# Patient Record
Sex: Female | Born: 1970 | Race: Black or African American | Hispanic: No | Marital: Single | Smoking: Never smoker
Health system: Southern US, Community
[De-identification: ages and names within clinical notes are randomized; demographics above are authoritative.]

## PROBLEM LIST (undated history)

## (undated) DIAGNOSIS — Z923 Personal history of irradiation: Secondary | ICD-10-CM

## (undated) DIAGNOSIS — I509 Heart failure, unspecified: Secondary | ICD-10-CM

## (undated) DIAGNOSIS — R51 Headache: Secondary | ICD-10-CM

## (undated) DIAGNOSIS — C787 Secondary malignant neoplasm of liver and intrahepatic bile duct: Secondary | ICD-10-CM

## (undated) DIAGNOSIS — F419 Anxiety disorder, unspecified: Secondary | ICD-10-CM

## (undated) DIAGNOSIS — G629 Polyneuropathy, unspecified: Secondary | ICD-10-CM

## (undated) DIAGNOSIS — C7800 Secondary malignant neoplasm of unspecified lung: Secondary | ICD-10-CM

## (undated) DIAGNOSIS — D509 Iron deficiency anemia, unspecified: Secondary | ICD-10-CM

## (undated) DIAGNOSIS — C50912 Malignant neoplasm of unspecified site of left female breast: Secondary | ICD-10-CM

## (undated) DIAGNOSIS — I82409 Acute embolism and thrombosis of unspecified deep veins of unspecified lower extremity: Secondary | ICD-10-CM

## (undated) HISTORY — PX: BREAST SURGERY: SHX581

---

## 1996-08-13 HISTORY — PX: TUBAL LIGATION: SHX77

## 2011-08-14 ENCOUNTER — Emergency Department (HOSPITAL_COMMUNITY)
Admission: EM | Admit: 2011-08-14 | Discharge: 2011-08-14 | Disposition: A | Discharge status: Home / Self Care | Payer: No Typology Code available for payment source | Attending: Emergency Medicine | Admitting: Emergency Medicine

## 2011-08-14 ENCOUNTER — Emergency Department (HOSPITAL_COMMUNITY): Payer: No Typology Code available for payment source

## 2011-08-14 DIAGNOSIS — M542 Cervicalgia: Secondary | ICD-10-CM | POA: Insufficient documentation

## 2011-08-14 DIAGNOSIS — M545 Low back pain, unspecified: Secondary | ICD-10-CM | POA: Insufficient documentation

## 2011-08-14 DIAGNOSIS — T148XXA Other injury of unspecified body region, initial encounter: Secondary | ICD-10-CM | POA: Insufficient documentation

## 2013-04-23 ENCOUNTER — Other Ambulatory Visit (INDEPENDENT_AMBULATORY_CARE_PROVIDER_SITE_OTHER): Payer: Self-pay | Admitting: General Surgery

## 2013-04-23 ENCOUNTER — Ambulatory Visit (INDEPENDENT_AMBULATORY_CARE_PROVIDER_SITE_OTHER): Payer: PRIVATE HEALTH INSURANCE | Admitting: General Surgery

## 2013-04-23 ENCOUNTER — Encounter (INDEPENDENT_AMBULATORY_CARE_PROVIDER_SITE_OTHER): Payer: Self-pay | Admitting: General Surgery

## 2013-04-23 VITALS — BP 136/90 | HR 84 | Temp 97.0°F | Resp 16 | Ht 65.5 in | Wt 230.8 lb

## 2013-04-23 DIAGNOSIS — N63 Unspecified lump in unspecified breast: Secondary | ICD-10-CM

## 2013-04-23 DIAGNOSIS — N6320 Unspecified lump in the left breast, unspecified quadrant: Secondary | ICD-10-CM

## 2013-04-23 NOTE — Addendum Note (Signed)
Addended by: June Leap on: 04/23/2013 05:35 PM   Modules accepted: Orders

## 2013-04-23 NOTE — Progress Notes (Signed)
Subjective:     Patient ID: Erika [name], female   DOB: Feb 17, 1971, 42 y.o.   MRN: [id number]  HPI This patient is a 42 year old female who was referred by Dr. Margretta Ditty for evaluation of a left breast mass. The patient is a 42 year old female with a six-month history of a left breast mass. Over the last 2 months patient had an area of depression at approximately the 10:00 position. The patient has not had any previous mammograms or ultrasounds. The patient states she has no family history of breast cancer. The patient is is no breast discharge or pain in the area.  Review of Systems  Constitutional: Negative.   HENT: Negative.   Respiratory: Negative.   Cardiovascular: Negative.   Gastrointestinal: Negative.   Endocrine: Negative.   Neurological: Negative.        Objective:   Physical Exam  Constitutional: She is oriented to person, place, and time. She appears well-developed and well-nourished.  HENT:  Head: Normocephalic and atraumatic.  Eyes: Conjunctivae and EOM are normal. Pupils are equal, round, and reactive to light.  Neck: Normal range of motion. Neck supple.  Cardiovascular: Normal rate, regular rhythm and normal heart sounds.   Pulmonary/Chest: Effort normal and breath sounds normal.    Abdominal: Soft. Bowel sounds are normal.  Musculoskeletal: Normal range of motion.  Neurological: She is alert and oriented to person, place, and time.       Assessment:     A 42 year old female with a left breast mass.     Plan:     1. I couldn't aspirate any purulence at the most superficial area of the mass. This was negative for purulence. 2. Proceed with a punch biopsy of the skin of the  superficial mass.  3. We'll have the patient undergo a screening mammogram in ultrasound of her left breast as soon as possible. 4. Once pathology results of her radiological examinations return will call the patient and discuss the results.

## 2013-04-24 ENCOUNTER — Other Ambulatory Visit (INDEPENDENT_AMBULATORY_CARE_PROVIDER_SITE_OTHER): Payer: Self-pay

## 2013-04-24 ENCOUNTER — Ambulatory Visit
Admission: RE | Admit: 2013-04-24 | Discharge: 2013-04-24 | Disposition: A | Discharge status: Home / Self Care | Payer: No Typology Code available for payment source | Source: Ambulatory Visit | Attending: General Surgery | Admitting: General Surgery

## 2013-04-24 ENCOUNTER — Ambulatory Visit (INDEPENDENT_AMBULATORY_CARE_PROVIDER_SITE_OTHER): Payer: PRIVATE HEALTH INSURANCE | Admitting: General Surgery

## 2013-04-24 ENCOUNTER — Encounter (INDEPENDENT_AMBULATORY_CARE_PROVIDER_SITE_OTHER): Payer: Self-pay

## 2013-04-24 ENCOUNTER — Telehealth (INDEPENDENT_AMBULATORY_CARE_PROVIDER_SITE_OTHER): Payer: Self-pay | Admitting: General Surgery

## 2013-04-24 VITALS — BP 132/88 | HR 82 | Temp 97.0°F | Resp 18

## 2013-04-24 DIAGNOSIS — Z4800 Encounter for change or removal of nonsurgical wound dressing: Secondary | ICD-10-CM

## 2013-04-24 DIAGNOSIS — N6320 Unspecified lump in the left breast, unspecified quadrant: Secondary | ICD-10-CM

## 2013-04-24 NOTE — Progress Notes (Signed)
Patient came in today after leaving the [facility] for a dressing change..pt's wound looked the same as when we seen her yesterday in urgent office...there has been no change...patient has appt tomorrow 5/14 at 3:00 to see me...she knows to call the office should any questions or concerns come up before then

## 2013-04-24 NOTE — Telephone Encounter (Signed)
Left a message x 2 for patient she has a stat appt  Today 04/24/13 at 12:45

## 2013-04-25 ENCOUNTER — Encounter (INDEPENDENT_AMBULATORY_CARE_PROVIDER_SITE_OTHER): Payer: Self-pay | Admitting: General Surgery

## 2013-04-25 ENCOUNTER — Ambulatory Visit (INDEPENDENT_AMBULATORY_CARE_PROVIDER_SITE_OTHER): Payer: PRIVATE HEALTH INSURANCE | Admitting: General Surgery

## 2013-04-25 VITALS — BP 122/70 | HR 97 | Temp 97.0°F | Ht 65.5 in | Wt 233.4 lb

## 2013-04-25 DIAGNOSIS — Z4800 Encounter for change or removal of nonsurgical wound dressing: Secondary | ICD-10-CM

## 2013-04-25 NOTE — Progress Notes (Signed)
Patient came in today for her daily dressing change...wound is still draining...she has another appt for dressing change 5/15 3:00 and she is aware

## 2013-04-26 ENCOUNTER — Telehealth (INDEPENDENT_AMBULATORY_CARE_PROVIDER_SITE_OTHER): Payer: Self-pay | Admitting: General Surgery

## 2013-04-26 ENCOUNTER — Ambulatory Visit (INDEPENDENT_AMBULATORY_CARE_PROVIDER_SITE_OTHER): Payer: PRIVATE HEALTH INSURANCE | Admitting: General Surgery

## 2013-04-26 DIAGNOSIS — N63 Unspecified lump in unspecified breast: Secondary | ICD-10-CM

## 2013-04-26 DIAGNOSIS — N6320 Unspecified lump in the left breast, unspecified quadrant: Secondary | ICD-10-CM

## 2013-04-26 NOTE — Telephone Encounter (Signed)
I called the patient and discuss her biopsy pathology. The patient is set to undergo a mammogram and ultrasound in one week. We'll have patient follow back up after her radiological studies to review them and proceed with the plan of care. I answered all her questions and concerns.

## 2013-04-26 NOTE — Progress Notes (Signed)
Patient states she is feeling better today . She has a friend with her for support . Left Breast area dressing was taken off and  A sterile dressing with 4x4 gauze and ABD pads x2 were applied with tape . Patient has return appt for tomorrow 04/27/13 nurse only

## 2013-04-27 ENCOUNTER — Encounter (INDEPENDENT_AMBULATORY_CARE_PROVIDER_SITE_OTHER): Payer: PRIVATE HEALTH INSURANCE

## 2013-04-27 ENCOUNTER — Encounter (INDEPENDENT_AMBULATORY_CARE_PROVIDER_SITE_OTHER): Payer: Self-pay | Admitting: General Surgery

## 2013-04-30 ENCOUNTER — Ambulatory Visit (INDEPENDENT_AMBULATORY_CARE_PROVIDER_SITE_OTHER): Payer: PRIVATE HEALTH INSURANCE | Admitting: General Surgery

## 2013-04-30 ENCOUNTER — Encounter (INDEPENDENT_AMBULATORY_CARE_PROVIDER_SITE_OTHER): Payer: Self-pay | Admitting: General Surgery

## 2013-04-30 VITALS — BP 116/82 | HR 96 | Temp 96.0°F | Ht 65.5 in | Wt 234.6 lb

## 2013-04-30 DIAGNOSIS — Z4800 Encounter for change or removal of nonsurgical wound dressing: Secondary | ICD-10-CM

## 2013-04-30 NOTE — Progress Notes (Signed)
Patient comes in today for her daily dressing change..pts wound is continuing to heal and patient has an appt to come in and see AR tomorrow to possibly silver nitrate one of the incisions that is bleeding..patient is doing fine and had no problems over the weekend.Marland KitchenMarland KitchenAR is aware of bleeding and is aware of pt's appt tomorrow on 05/01/13 and possible silver nitrate

## 2013-05-01 ENCOUNTER — Encounter (INDEPENDENT_AMBULATORY_CARE_PROVIDER_SITE_OTHER): Payer: PRIVATE HEALTH INSURANCE

## 2013-05-01 ENCOUNTER — Ambulatory Visit (INDEPENDENT_AMBULATORY_CARE_PROVIDER_SITE_OTHER): Payer: PRIVATE HEALTH INSURANCE | Admitting: General Surgery

## 2013-05-01 ENCOUNTER — Encounter (INDEPENDENT_AMBULATORY_CARE_PROVIDER_SITE_OTHER): Payer: Self-pay | Admitting: General Surgery

## 2013-05-01 VITALS — BP 136/72 | HR 106 | Temp 97.8°F | Resp 18 | Ht 64.0 in | Wt 233.0 lb

## 2013-05-01 DIAGNOSIS — N6320 Unspecified lump in the left breast, unspecified quadrant: Secondary | ICD-10-CM

## 2013-05-01 DIAGNOSIS — N63 Unspecified lump in unspecified breast: Secondary | ICD-10-CM

## 2013-05-01 NOTE — Progress Notes (Signed)
Patient ID: Erika [name], female   DOB: 12-11-1971, 42 y.o.   MRN: [id number] The patient is a 42 year old female with a left breast mass, and breast carcinoma. The patient been coming to nurse only clinics for her change in her dressing. Wound looks great today with minimal drainagec, no bleeding.  The patient continue with nurse only visits for dressing changes. The patient states she feels ready to change the dressing on her own.  The patient is to return to the breast center for her mammogram and ultrasound in one week. Will follow up with those studies and have her follow up with [place] to discuss the results.

## 2013-05-02 ENCOUNTER — Encounter (INDEPENDENT_AMBULATORY_CARE_PROVIDER_SITE_OTHER): Payer: Self-pay | Admitting: General Surgery

## 2013-05-02 ENCOUNTER — Ambulatory Visit (INDEPENDENT_AMBULATORY_CARE_PROVIDER_SITE_OTHER): Payer: PRIVATE HEALTH INSURANCE | Admitting: General Surgery

## 2013-05-02 VITALS — BP 102/78 | HR 97 | Temp 96.8°F | Ht 65.5 in | Wt 232.8 lb

## 2013-05-02 DIAGNOSIS — Z4800 Encounter for change or removal of nonsurgical wound dressing: Secondary | ICD-10-CM

## 2013-05-02 NOTE — Progress Notes (Signed)
Patient comes in today for her daily dressing change..wound still healing and looked the same..pt tolerated and will start doing dressing changes daily to herself starting tomorrow 05/03/13...she is set up for mgm and u/s on 5/27 and knows to call if she has any questions or concerns that arise at any given time...patient would like a refill on her pain med..she is aware that you are off after call and that I would be talking to you about this in the office tomorrow

## 2013-05-03 ENCOUNTER — Telehealth (INDEPENDENT_AMBULATORY_CARE_PROVIDER_SITE_OTHER): Payer: Self-pay | Admitting: General Surgery

## 2013-05-03 NOTE — Telephone Encounter (Signed)
Called refill on hydrocodone 5/325 #30 no refills to [facility] [phone] and spoke with [name]..called patient to make her aware that the refill had been called in as well.Erika [name]

## 2013-05-08 ENCOUNTER — Ambulatory Visit: Admission: RE | Admit: 2013-05-08 | Payer: No Typology Code available for payment source | Source: Ambulatory Visit

## 2013-05-08 ENCOUNTER — Ambulatory Visit
Admission: RE | Admit: 2013-05-08 | Discharge: 2013-05-08 | Disposition: A | Discharge status: Home / Self Care | Payer: No Typology Code available for payment source | Source: Ambulatory Visit | Attending: General Surgery | Admitting: General Surgery

## 2013-05-08 ENCOUNTER — Other Ambulatory Visit (INDEPENDENT_AMBULATORY_CARE_PROVIDER_SITE_OTHER): Payer: Self-pay | Admitting: General Surgery

## 2013-05-08 ENCOUNTER — Telehealth (INDEPENDENT_AMBULATORY_CARE_PROVIDER_SITE_OTHER): Payer: Self-pay | Admitting: General Surgery

## 2013-05-08 DIAGNOSIS — N6320 Unspecified lump in the left breast, unspecified quadrant: Secondary | ICD-10-CM

## 2013-05-08 DIAGNOSIS — C50912 Malignant neoplasm of unspecified site of left female breast: Secondary | ICD-10-CM

## 2013-05-08 NOTE — Telephone Encounter (Signed)
Called cancer ctr to get patient presented for cancer confrence and spoke with Malachi Bonds at 3:25 05/08/13...she stated she would pass the info to [place]...referral put in system and patient notified that she should hear something from [facility] [phone] in the next 48hrs and if she does not hear anything to call me back and I will call and check the status...patient was ok with all instructions...referral to carla for pre-cert

## 2013-05-10 ENCOUNTER — Encounter: Payer: Self-pay | Admitting: Oncology

## 2013-05-10 ENCOUNTER — Encounter: Payer: Self-pay | Admitting: *Deleted

## 2013-05-11 ENCOUNTER — Telehealth (INDEPENDENT_AMBULATORY_CARE_PROVIDER_SITE_OTHER): Payer: Self-pay

## 2013-05-11 NOTE — Telephone Encounter (Signed)
Called pt to make her an appt with Dr Dwain Sarna for next week on 6/3 to discuss her new diagnosis of breast cancer.The pt is being referred by Dr Derrell Lolling to Dr Dwain Sarna. The pt understands.

## 2013-05-14 ENCOUNTER — Encounter: Payer: Self-pay | Admitting: *Deleted

## 2013-05-15 ENCOUNTER — Ambulatory Visit (INDEPENDENT_AMBULATORY_CARE_PROVIDER_SITE_OTHER): Payer: PRIVATE HEALTH INSURANCE | Admitting: General Surgery

## 2013-05-15 ENCOUNTER — Ambulatory Visit
Admission: RE | Admit: 2013-05-15 | Discharge: 2013-05-15 | Disposition: A | Discharge status: Home / Self Care | Payer: No Typology Code available for payment source | Source: Ambulatory Visit | Attending: Radiation Oncology | Admitting: Radiation Oncology

## 2013-05-15 ENCOUNTER — Encounter (INDEPENDENT_AMBULATORY_CARE_PROVIDER_SITE_OTHER): Payer: Self-pay | Admitting: General Surgery

## 2013-05-15 ENCOUNTER — Encounter: Payer: Self-pay | Admitting: Radiation Oncology

## 2013-05-15 VITALS — BP 118/68 | HR 70 | Temp 98.6°F | Resp 12 | Ht 65.5 in | Wt 229.6 lb

## 2013-05-15 VITALS — BP 115/82 | HR 93 | Temp 98.4°F | Resp 18 | Ht 65.0 in | Wt 231.8 lb

## 2013-05-15 DIAGNOSIS — C50212 Malignant neoplasm of upper-inner quadrant of left female breast: Secondary | ICD-10-CM

## 2013-05-15 DIAGNOSIS — C50219 Malignant neoplasm of upper-inner quadrant of unspecified female breast: Secondary | ICD-10-CM | POA: Insufficient documentation

## 2013-05-15 DIAGNOSIS — D0592 Unspecified type of carcinoma in situ of left breast: Secondary | ICD-10-CM

## 2013-05-15 DIAGNOSIS — C50912 Malignant neoplasm of unspecified site of left female breast: Secondary | ICD-10-CM

## 2013-05-15 NOTE — Progress Notes (Addendum)
Radiation Oncology         [phone] ________________________________  Initial outpatient Consultation  Name: Erika [name] MRN: [id number]  Date: 05/15/2013  DOB: March 20, 1971  NW:GNFAOZHY,QMVHQI M, MD  Axel Filler, MD   REFERRING PHYSICIAN: Axel Filler, MD  DIAGNOSIS: Upper inner quadrant left breast cancer, [id number]  HISTORY OF PRESENT ILLNESS::Erika [name] is a 42 y.o. female who noticed a small red mass. on the surface of her left breast about 6 months ago. This began to grow and she saw a Dr. in urgent care and he cut into the mass assuming it was an abscess. Patient was subsequently referred to Dr. Derrell Lolling. She is s/p a biopsy on May 12 with results as below. She underwent mammography on 05/08/2013 revealing a large mass in the upper inner left breast measuring at least 10 cm. There is mild overlying skin thickening compatible with dermal invasion. There is a separate cluster of indeterminate left breast calcifications measuring 3 x 10 mm. There was an indeterminate 7 mm axillary lymph node. She is status post ultrasound on 05/08/2013 revealing a large heterogeneous mass within the upper inner left breast at the 10-11 o'clock position,  measuring at least 6.5 cm, but evaluation is limited by transducer skin area and protuberant portion of the mass. A 7 mm level I left axillary lymph node is identified with slightly echogenic rim -suspicious.  She is seeing Dr. Dwain Sarna this afternoon and sees Dr. Welton Flakes later this week. She denies weight loss headaches dizziness nausea vomiting or night sweats. She reports that she has had a lump that has come and gone in her left axilla - currently gone. She is fatigued. 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   Biopsy was performed on 04/23/2013. Pathology revealed : Breast, biopsy, left mass - INVASIVE HIGH GRADE CARCINOMA, SEE COMMENT. Microscopic Comment The carcinoma demonstrates the follow  immunophenotype: GCDFP - negative expression Estrogen Receptor - focal weak expression ADDITIONAL INFORMATION: CHROMOGENIC IN-SITU HYBRIDIZATION Results: HER-2/NEU BY CISH - NO AMPLIFICATION OF HER-2 DETECTED.  PREVIOUS RADIATION THERAPY: No  Gynecologic history: Started menstruating at age 55, first pregnancy age 72. She is given birth to 3 children. She is status post tubal ligation. She did not breast-feed her children.  PAST MEDICAL HISTORY:  has a past medical history of Breast cancer.  she denies any prior cancers.  PAST SURGICAL HISTORY: Past Surgical History  Procedure Laterality Date  . Breast biopsy    . Tubal ligation      FAMILY HISTORY: family history includes Cancer in her maternal aunt; Diabetes in her father; and Hypertension in her mother. she denies any history of ovarian or breast cancer in her family.  SOCIAL HISTORY:  reports that she has never smoked. She has never used smokeless tobacco. She reports that she does not drink alcohol or use illicit drugs. she lives in [place]  ALLERGIES: Review of patient's allergies indicates no known allergies.  MEDICATIONS:  Current Outpatient Prescriptions  Medication Sig Dispense Refill  . ibuprofen (ADVIL,MOTRIN) 200 MG tablet Take 200 mg by mouth every 6 (six) hours as needed for pain (600-800 mg prn left breast pain).      Marland Kitchen HYDROcodone-acetaminophen (NORCO/VICODIN) 5-325 MG per tablet Take 1 tablet by mouth every 6 (six) hours as needed for pain.       No current facility-administered medications for this encounter.    REVIEW OF SYSTEMS:     Pertinent items are noted in HPI.   PHYSICAL  EXAM:  height is 5\' 5"  (1.651 m) and weight is 231 lb 12.8 oz (105.144 kg). Her oral temperature is 98.4 F (36.9 C). Her blood pressure is 115/82 and her pulse is 93. Her respiration is 18 and oxygen saturation is 100%.    General: Alert and oriented, in no acute distress HEENT: Head is normocephalic. Pupils are equally round  and reactive to light. Extraocular movements are intact. Oropharynx is clear.  Neck: Neck is supple, no palpable cervical or supraclavicular lymphadenopathy. Heart: Regular in rate and rhythm with no murmurs, rubs, or gallops. Chest: Clear to auscultation bilaterally, with no rhonchi, wheezes, or rales. Abdomen: Soft, nontender, nondistended, with no rigidity or guarding. Extremities: No cyanosis or edema. Lymphatics: No concerning lymphadenopathy appreciated. Skin: No concerning lesions. Musculoskeletal: symmetric strength and muscle tone throughout. Neurologic: Cranial nerves II through XII are grossly intact. No obvious focalities. Speech is fluent. Coordination is intact. Psychiatric: Judgment and insight are intact. Affect is appropriate. Breasts: Right breast is unremarkable. No axillary adenopathy appreciated on either side. Left breast is notable for a large raised rounded purplish mass in the upper inner quadrant. This is the size of a small tangerine, exuding serous sanguinous fluid. No peau d'orange appearance to the breast.   LABORATORY DATA:  No results found for this basename: WBC, HGB, HCT, MCV, PLT   CMP  No results found for this basename: na, k, cl, co2, glucose, bun, creatinine, calcium, prot, albumin, ast, alt, alkphos, bilitot, gfrnonaa, gfraa      RADIOGRAPHY: US Breast Left  05/08/2013   *RADIOLOGY REPORT*  Clinical Data:  42 year old female with large left breast mass - recent punch biopsy demonstrating high-grade carcinoma.  DIGITAL DIAGNOSTIC BILATERAL MAMMOGRAM WITH CAD AND LEFT BREAST ULTRASOUND:  Comparison:  None.  Findings:  ACR Breast Density Category 2: There is a scattered fibroglandular pattern.  A large irregular mass within the upper inner posterior left breast is identified - measuring at least 10 cm. Mild overlying skin thickening is identified compatible with dermal invasion. A 3 x 10 mm cluster of slightly heterogeneous left breast calcifications  approximate 4 cm anterior and lateral to this mass is identified.  There is no evidence of suspicious mass, distortion or worrisome calcifications within the right breast. Mammographic images were processed with CAD.  On physical exam, a very large protuberant mass within the upper inner left breast is identified.  Ultrasound is performed, showing a large heterogeneous mass within the upper inner left breast at the 10-11 o'clock position, measuring at least 6.5 cm, but evaluation is limited by transducer skin area and protuberant portion of the mass. A 7 mm level I left axillary lymph node is identified with slightly echogenic rim - suspicious.  IMPRESSION: Large biopsy-proven carcinoma within the upper inner left breast, measuring at least 10 cm mammographically. Mild overlying skin thickening compatible with dermal invasion.  3 x 10 mm cluster of indeterminate left breast calcifications 4 cm anterior and lateral to the biopsy-proven neoplasm.  This may represent DCIS and consider tissue sampling.  Indeterminate 7 mm level I left axillary lymph node - metastatic disease not excluded.  No mammographic evidence of right breast malignancy.  BI-RADS CATEGORY 6:  Known biopsy-proven malignancy - appropriate action should be taken.  RECOMMENDATION: Treatment plan.  Consider biopsies of the indeterminate left axillary lymph node and left breast calcifications as clinically indicated.  Consider bilateral breast MRI as clinically indicated.  I have discussed the findings and recommendations with the patient. Results were also  provided in writing at the conclusion of the visit.   Original Report Authenticated By: Harmon Pier, M.D.   Mm Digital Diagnostic Bilat  05/08/2013   *RADIOLOGY REPORT*  Clinical Data:  42 year old female with large left breast mass - recent punch biopsy demonstrating high-grade carcinoma.  DIGITAL DIAGNOSTIC BILATERAL MAMMOGRAM WITH CAD AND LEFT BREAST ULTRASOUND:  Comparison:  None.  Findings:  ACR  Breast Density Category 2: There is a scattered fibroglandular pattern.  A large irregular mass within the upper inner posterior left breast is identified - measuring at least 10 cm. Mild overlying skin thickening is identified compatible with dermal invasion. A 3 x 10 mm cluster of slightly heterogeneous left breast calcifications approximate 4 cm anterior and lateral to this mass is identified.  There is no evidence of suspicious mass, distortion or worrisome calcifications within the right breast. Mammographic images were processed with CAD.  On physical exam, a very large protuberant mass within the upper inner left breast is identified.  Ultrasound is performed, showing a large heterogeneous mass within the upper inner left breast at the 10-11 o'clock position, measuring at least 6.5 cm, but evaluation is limited by transducer skin area and protuberant portion of the mass. A 7 mm level I left axillary lymph node is identified with slightly echogenic rim - suspicious.  IMPRESSION: Large biopsy-proven carcinoma within the upper inner left breast, measuring at least 10 cm mammographically. Mild overlying skin thickening compatible with dermal invasion.  3 x 10 mm cluster of indeterminate left breast calcifications 4 cm anterior and lateral to the biopsy-proven neoplasm.  This may represent DCIS and consider tissue sampling.  Indeterminate 7 mm level I left axillary lymph node - metastatic disease not excluded.  No mammographic evidence of right breast malignancy.  BI-RADS CATEGORY 6:  Known biopsy-proven malignancy - appropriate action should be taken.  RECOMMENDATION: Treatment plan.  Consider biopsies of the indeterminate left axillary lymph node and left breast calcifications as clinically indicated.  Consider bilateral breast MRI as clinically indicated.  I have discussed the findings and recommendations with the patient. Results were also provided in writing at the conclusion of the visit.   Original Report  Authenticated By: Harmon Pier, M.D.      IMPRESSION/PLAN: This is a lovely 42 year old woman with at least locally advanced left breast cancer. She will likely require systemic imaging and I will defer to medical oncology to order that.  She understands that chemotherapy surgery and radiotherapy will be important modalities to her care. I told her that surgery may require a mastectomy. She'll discuss this further with Dr. Dwain Sarna today. I told her that chemotherapy may precede or follow surgery, depending on the agreement between her surgeon and medical oncologist. She understands that radiotherapy is of benefit for local regional control in the region of the breast or chest wall and regional lymph nodes. She understands it will probably be a matter of months until she is ready for adjuvant radiotherapy. She understands this will take place for about 6-7 weeks. We spoke about the basic side effects of radiotherapy and logistics of receiving it. She seems comfortable with the idea of radiotherapy and all questions were answered. She understands that if I am on my maternity leave by the  time she is ready for radiotherapy, she'll be in the hands of one of my excellent partners.   I spent 45 minutes minutes face to face with the patient and more than 50% of that time was spent in counseling and/or  coordination of care.    __________________________________________   Lonie Peak, MD

## 2013-05-15 NOTE — Progress Notes (Signed)
Patient ID: Erika [name], female   DOB: Aug 28, 1971, 42 y.o.   MRN: [id number]  Chief Complaint  Patient presents with  . Breast Cancer    HPI Erika [name] is a 42 y.o. female.   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  2 months ago this began getting larger much quicker.  She eventually was seen in an urgent care where this mass was thought to be an abscess.  Apparently there was an attempt to excise or drain this that was not successful and she was referred to our office for evaluation.  She was seen in our urgent office and underwent punch biopsy of her skin and the mass.  She was then also sent for mm/us.  The biopsy shows invasive high grade carcinoma that appears to be breast.  It is her2 not amplified, er pos at 15, pr neg and Ki approaches 100%.  She has also undergone mm with at least 10 cm upper inner left breast tumor with mild overyling skin thickening.  There is also a 3x10 mm cluster of calcs near there also.  There is indeterminate 7 mm level 1 left axillary node.  No mm evidence of right breast malignancy.  She comes in today after seeing Dr. Basilio Cairo of radiation therapy for evaluation.  Past Medical History  Diagnosis Date  . Breast cancer     Past Surgical History  Procedure Laterality Date  . Tubal ligation      Family History  Problem Relation Age of Onset  . Hypertension Mother   . Diabetes Father   . Cancer Maternal Aunt     stomach    Social History History  Substance Use Topics  . Smoking status: Never Smoker   . Smokeless tobacco: Never Used  . Alcohol Use: No    No Known Allergies  Current Outpatient Prescriptions  Medication Sig Dispense Refill  . ibuprofen (ADVIL,MOTRIN) 200 MG tablet Take 200 mg by mouth every 6 (six) hours as needed for pain (600-800 mg prn left breast pain).       No current facility-administered medications for this visit.    Review of  Systems Review of Systems  Constitutional: Negative for fever, chills and unexpected weight change.  HENT: Negative for hearing loss, congestion, sore throat, trouble swallowing and voice change.   Eyes: Negative for visual disturbance.  Respiratory: Negative for cough and wheezing.   Cardiovascular: Negative for chest pain, palpitations and leg swelling.  Gastrointestinal: Negative for nausea, vomiting, abdominal pain, diarrhea, constipation, blood in stool, abdominal distention and anal bleeding.  Genitourinary: Negative for hematuria, vaginal bleeding and difficulty urinating.  Musculoskeletal: Negative for arthralgias.  Skin: Negative for rash and wound.  Neurological: Negative for seizures, syncope and headaches.  Hematological: Negative for adenopathy. Does not bruise/bleed easily.  Psychiatric/Behavioral: Negative for confusion.    Blood pressure 118/68, pulse 70, temperature 98.6 F (37 C), temperature source Temporal, resp. rate 12, height 5' 5.5" (1.664 m), weight 229 lb 9.6 oz (104.146 kg).  Physical Exam Physical Exam  Vitals reviewed. Constitutional: She appears well-developed and well-nourished.  Cardiovascular: Normal rate, regular rhythm and normal heart sounds.   Pulmonary/Chest: Effort normal and breath sounds normal. She has no wheezes. She has no rales. Right breast exhibits no inverted nipple, no mass, no nipple discharge, no skin change and no tenderness. Left breast exhibits mass, skin change and tenderness. Left breast exhibits no inverted nipple. Breasts  are symmetrical.    Abdominal: Soft.  Lymphadenopathy:    She has no cervical adenopathy.    She has axillary adenopathy.       Right axillary: No pectoral and no lateral adenopathy present.       Left axillary: Lateral adenopathy present. No pectoral adenopathy present.      Right: No supraclavicular adenopathy present.       Left: No supraclavicular adenopathy present.    Data Reviewed DIGITAL  DIAGNOSTIC BILATERAL MAMMOGRAM WITH CAD AND LEFT BREAST  ULTRASOUND:  Comparison: None.  Findings:  ACR Breast Density Category 2: There is a scattered fibroglandular  pattern.  A large irregular mass within the upper inner posterior left breast  is identified - measuring at least 10 cm. Mild overlying skin  thickening is identified compatible with dermal invasion.  A 3 x 10 mm cluster of slightly heterogeneous left breast  calcifications approximate 4 cm anterior and lateral to this mass  is identified.  There is no evidence of suspicious mass, distortion or worrisome  calcifications within the right breast.  Mammographic images were processed with CAD.  On physical exam, a very large protuberant mass within the upper  inner left breast is identified.  Ultrasound is performed, showing a large heterogeneous mass within  the upper inner left breast at the 10-11 o'clock position,  measuring at least 6.5 cm, but evaluation is limited by transducer  skin area and protuberant portion of the mass.  A 7 mm level I left axillary lymph node is identified with slightly  echogenic rim - suspicious.  IMPRESSION:  Large biopsy-proven carcinoma within the upper inner left breast,  measuring at least 10 cm mammographically. Mild overlying skin  thickening compatible with dermal invasion.  3 x 10 mm cluster of indeterminate left breast calcifications 4 cm  anterior and lateral to the biopsy-proven neoplasm. This may  represent DCIS and consider tissue sampling.  Indeterminate 7 mm level I left axillary lymph node - metastatic  disease not excluded.  No mammographic evidence of right breast malignancy.   Assessment    Locally advanced left breast cancer     Plan    Staging studies in conjunction with med onc (she sees Dr Welton Flakes on Thursday), I will plan port placement as I think she needs to start chemo asap, mri breast, biopsy node on left, genetics   We discussed the staging and  pathophysiology of breast cancer. We discussed all of the different options for treatment for breast cancer including surgery, chemotherapy, radiation therapy, Herceptin, and antiestrogen therapy.  She essentially has tnbc with high proliferation index and we discussed aggressive nature of this tumor.  We discussed talking to financial counselor when she goes to [facility] on Thursday also.  She could also have stage IV disease and needs staging studies.   We discussed further evaluation of her nodes prior to determining eventual surgical treatment of her nodes. I think she will likely need mastectomy at time of surgery but will wait for more info with mr and possibly more biopsies.  I think she needs primary chemotherapy without a doubt due to locally advanced nature and this is likely systemic disease already. We discussed the risks of operation including bleeding, infection, possible reoperation, ptx          Erika [name] 05/15/2013, 4:05 PM

## 2013-05-15 NOTE — Progress Notes (Signed)
Complete PATIENT MEASURE OF DISTRESS worksheet with a score of 5 submitted to social work.  

## 2013-05-15 NOTE — Progress Notes (Signed)
See progress note under physician encounter. 

## 2013-05-15 NOTE — Progress Notes (Signed)
Reports occasional sharp shooting left breast pain 4 on a scale of 0-10. Reports taking motrin for this pain. Reports mass presented smaller initially some six months ago. Reports when the mass began to grown and after she got insurance she saw a doctor about the mass on May 12th. Reports this doctor immediately cut into the mass assuming it was an abscess. Reports that after this doctor realized it was more than an abscess he sent her to the surgeon for a biopsy which revealed cancer. Reports changing her left breast dressing once per day. Reported clear puss like drainage from left breast mass. Denies nipple discharge. Denies fever of left breast. Full ROM of all extremities noted. Denies headache, dizziness, nausea, vomiting, night sweats or unintentional weight loss. Reports fatigue.

## 2013-05-16 ENCOUNTER — Other Ambulatory Visit: Payer: Self-pay | Admitting: Medical Oncology

## 2013-05-16 ENCOUNTER — Telehealth (INDEPENDENT_AMBULATORY_CARE_PROVIDER_SITE_OTHER): Payer: Self-pay | Admitting: General Surgery

## 2013-05-16 ENCOUNTER — Encounter (HOSPITAL_COMMUNITY): Payer: Self-pay | Admitting: Respiratory Therapy

## 2013-05-16 DIAGNOSIS — C50212 Malignant neoplasm of upper-inner quadrant of left female breast: Secondary | ICD-10-CM

## 2013-05-16 NOTE — Telephone Encounter (Signed)
US biopsy scheduled for 05/21/2013 - patient made aware. She is made aware Erika [name] with [facility] will call with a date for her MR and [facility] will call with an appt for genetic counseling. She will call with any questions or if she does not hear from them.

## 2013-05-17 ENCOUNTER — Ambulatory Visit: Payer: No Typology Code available for payment source

## 2013-05-17 ENCOUNTER — Other Ambulatory Visit (HOSPITAL_BASED_OUTPATIENT_CLINIC_OR_DEPARTMENT_OTHER): Payer: No Typology Code available for payment source | Admitting: Lab

## 2013-05-17 ENCOUNTER — Ambulatory Visit (HOSPITAL_BASED_OUTPATIENT_CLINIC_OR_DEPARTMENT_OTHER): Payer: No Typology Code available for payment source | Admitting: Oncology

## 2013-05-17 ENCOUNTER — Encounter: Payer: Self-pay | Admitting: Oncology

## 2013-05-17 VITALS — BP 112/77 | HR 96 | Temp 98.4°F | Resp 20 | Ht 65.5 in | Wt 228.6 lb

## 2013-05-17 DIAGNOSIS — C50219 Malignant neoplasm of upper-inner quadrant of unspecified female breast: Secondary | ICD-10-CM

## 2013-05-17 DIAGNOSIS — C50212 Malignant neoplasm of upper-inner quadrant of left female breast: Secondary | ICD-10-CM

## 2013-05-17 LAB — CBC WITH DIFFERENTIAL/PLATELET
Eosinophils Absolute: 0.3 10*3/uL (ref 0.0–0.5)
HCT: 26.3 % — ABNORMAL LOW (ref 34.8–46.6)
LYMPH%: 23.2 % (ref 14.0–49.7)
MCHC: 30.8 g/dL — ABNORMAL LOW (ref 31.5–36.0)
MCV: 72.5 fL — ABNORMAL LOW (ref 79.5–101.0)
MONO%: 5.4 % (ref 0.0–14.0)
NEUT#: 6 10*3/uL (ref 1.5–6.5)
NEUT%: 68.2 % (ref 38.4–76.8)
Platelets: 515 10*3/uL — ABNORMAL HIGH (ref 145–400)
RBC: 3.63 10*6/uL — ABNORMAL LOW (ref 3.70–5.45)

## 2013-05-17 LAB — COMPREHENSIVE METABOLIC PANEL (CC13)
Alkaline Phosphatase: 103 U/L (ref 40–150)
Creatinine: 0.7 mg/dL (ref 0.6–1.1)
Glucose: 89 mg/dl (ref 70–99)
Sodium: 139 mEq/L (ref 136–145)
Total Bilirubin: 0.25 mg/dL (ref 0.20–1.20)
Total Protein: 7.9 g/dL (ref 6.4–8.3)

## 2013-05-17 NOTE — Addendum Note (Signed)
Encounter addended by: Delynn Flavin, RN on: 05/17/2013  5:22 PM<BR>     Documentation filed: Charges VN

## 2013-05-17 NOTE — Progress Notes (Signed)
Checked in new patient. No financial issues. She doesn't have a living will or POA at all. I gave her a [facility] form

## 2013-05-18 ENCOUNTER — Telehealth (INDEPENDENT_AMBULATORY_CARE_PROVIDER_SITE_OTHER): Payer: Self-pay

## 2013-05-18 ENCOUNTER — Encounter (HOSPITAL_COMMUNITY)
Admission: RE | Admit: 2013-05-18 | Discharge: 2013-05-18 | Disposition: A | Discharge status: Home / Self Care | Payer: No Typology Code available for payment source | Source: Ambulatory Visit | Attending: General Surgery | Admitting: General Surgery

## 2013-05-18 ENCOUNTER — Encounter (HOSPITAL_COMMUNITY): Payer: Self-pay

## 2013-05-18 HISTORY — DX: Headache: R51

## 2013-05-18 LAB — CBC WITH DIFFERENTIAL/PLATELET
Basophils Relative: 0 % (ref 0–1)
Eosinophils Absolute: 0.3 10*3/uL (ref 0.0–0.7)
HCT: 27.4 % — ABNORMAL LOW (ref 36.0–46.0)
Hemoglobin: 8.4 g/dL — ABNORMAL LOW (ref 12.0–15.0)
Lymphs Abs: 1.8 10*3/uL (ref 0.7–4.0)
MCH: 22.2 pg — ABNORMAL LOW (ref 26.0–34.0)
MCHC: 30.7 g/dL (ref 30.0–36.0)
Monocytes Absolute: 0.4 10*3/uL (ref 0.1–1.0)
Neutro Abs: 5.8 10*3/uL (ref 1.7–7.7)
RDW: 16.9 % — ABNORMAL HIGH (ref 11.5–15.5)

## 2013-05-18 LAB — PROTIME-INR
INR: 1.03 (ref 0.00–1.49)
Prothrombin Time: 13.4 seconds (ref 11.6–15.2)

## 2013-05-18 LAB — COMPREHENSIVE METABOLIC PANEL
Albumin: 2.8 g/dL — ABNORMAL LOW (ref 3.5–5.2)
BUN: 8 mg/dL (ref 6–23)
Calcium: 9.1 mg/dL (ref 8.4–10.5)
Creatinine, Ser: 0.84 mg/dL (ref 0.50–1.10)
Total Bilirubin: 0.2 mg/dL — ABNORMAL LOW (ref 0.3–1.2)
Total Protein: 8.2 g/dL (ref 6.0–8.3)

## 2013-05-18 LAB — SURGICAL PCR SCREEN: Staphylococcus aureus: NEGATIVE

## 2013-05-18 LAB — HCG, SERUM, QUALITATIVE: Preg, Serum: NEGATIVE

## 2013-05-18 NOTE — Progress Notes (Signed)
Pt denies SOB, chest pain, and being under the care of a cardiologist. Hcg serum pregnancy test done at PAT visit.

## 2013-05-18 NOTE — Pre-Procedure Instructions (Addendum)
Erika [name]  05/18/2013   Your procedure is scheduled on:  Tuesday, May 22, 2013  Report to [facility] at 1:30 PM  Call this number if you have problems the morning of surgery: [phone]   Remember:   Do not eat food or drink liquids after midnight.   Take these medicines the morning of surgery with A SIP OF WATER: None             Stop taking Aspirin and herbal medications. Do not take any NSAIDs ie: Ibuprofen, Advil, Naproxen or any medication             containing Aspirin.  Do not wear jewelry, make-up or nail polish.  Do not wear lotions, powders, or perfumes. You may wear deodorant.  Do not shave 48 hours prior to surgery. Men may shave face and neck.  Do not bring valuables to the hospital.  [facility] is not responsible  for any belongings or valuables.  Contacts, dentures or bridgework may not be worn into surgery.  Leave suitcase in the car. After surgery it may be brought to your room.  For patients admitted to the hospital, checkout time is 11:00 AM the day of discharge.   Patients discharged the day of surgery will not be allowed to drive home.  Name and phone number of your driver:   Special Instructions: Shower using CHG 2 nights before surgery and the night before surgery.  If you shower the day of surgery use CHG.  Use special wash - you have one bottle of CHG for all showers.  You should use approximately 1/3 of the bottle for each shower.   Please read over the following fact sheets that you were given: Pain Booklet, Coughing and Deep Breathing and Surgical Site Infection Prevention

## 2013-05-18 NOTE — Telephone Encounter (Signed)
Erika [name] at short stay called wanting to be sure Dr Dwain Sarna is aware of low hgb on pts pre op labs. Pt has PAC placement on 05-22-14. I advised her I will send msg to Dr Dwain Sarna and his assistant to review labs and advise any action.

## 2013-05-18 NOTE — Progress Notes (Signed)
Cindy of Dr. Doreen Salvage office made aware to report to surgeon that pt hemoglobin was 8.4 and HCT was 27.4. Pt chart for [place], [place] to review abnormal labs also.

## 2013-05-21 ENCOUNTER — Ambulatory Visit
Admission: RE | Admit: 2013-05-21 | Discharge: 2013-05-21 | Disposition: A | Discharge status: Home / Self Care | Payer: No Typology Code available for payment source | Source: Ambulatory Visit | Attending: General Surgery | Admitting: General Surgery

## 2013-05-21 ENCOUNTER — Other Ambulatory Visit (HOSPITAL_COMMUNITY): Payer: Self-pay | Admitting: Diagnostic Radiology

## 2013-05-21 DIAGNOSIS — C50212 Malignant neoplasm of upper-inner quadrant of left female breast: Secondary | ICD-10-CM

## 2013-05-21 MED ORDER — CEFAZOLIN SODIUM-DEXTROSE 2-3 GM-% IV SOLR: 2.0000 g | INTRAVENOUS | Status: DC

## 2013-05-21 NOTE — Telephone Encounter (Signed)
Hct is fine to proceed.  I reviewed.

## 2013-05-21 NOTE — Progress Notes (Signed)
Anesthesia Chart Review:  Patient is a 42 year old female scheduled for Port-a-cath insertion on 05/22/13 by Dr. Dwain Sarna.  History includes new diagnosis of breast cancer, non-smoker, obesity, headaches, tubal ligation.  CXR on 05/18/13 showed no acute disease.  Preoperative labs noted.  H/H 8.4/27.4, PLT 554K (slightly up from labs done the day prior).  H/H results already called to [facility] by the PAT RN.  Defer additional orders, if any, to Dr. Dwain Sarna.  Velna [facility] Stay [facility]/Anesthesiology Phone [phone] 05/21/2013 9:31 AM

## 2013-05-21 NOTE — Progress Notes (Signed)
Call from DrDoreen Salvage office, Hgb okay for surgery 05-22-2013.

## 2013-05-21 NOTE — Telephone Encounter (Signed)
Called Short stay to advise them that pt's hgb lab is ok per Dr Dwain Sarna for pt's surgery on 05/22/13.

## 2013-05-22 ENCOUNTER — Encounter (HOSPITAL_COMMUNITY): Payer: Self-pay | Admitting: Vascular Surgery

## 2013-05-22 ENCOUNTER — Ambulatory Visit
Admission: RE | Admit: 2013-05-22 | Discharge: 2013-05-22 | Disposition: A | Discharge status: Home / Self Care | Payer: No Typology Code available for payment source | Source: Ambulatory Visit | Attending: General Surgery | Admitting: General Surgery

## 2013-05-22 ENCOUNTER — Ambulatory Visit (HOSPITAL_COMMUNITY): Payer: No Typology Code available for payment source

## 2013-05-22 ENCOUNTER — Encounter (HOSPITAL_COMMUNITY)
Admission: RE | Disposition: A | Discharge status: Home / Self Care | Payer: Self-pay | Source: Ambulatory Visit | Attending: General Surgery

## 2013-05-22 ENCOUNTER — Ambulatory Visit (HOSPITAL_BASED_OUTPATIENT_CLINIC_OR_DEPARTMENT_OTHER)
Admission: RE | Admit: 2013-05-22 | Discharge: 2013-05-22 | Disposition: A | Discharge status: Home / Self Care | Payer: No Typology Code available for payment source | Source: Ambulatory Visit | Attending: General Surgery | Admitting: General Surgery

## 2013-05-22 ENCOUNTER — Ambulatory Visit (HOSPITAL_COMMUNITY): Payer: No Typology Code available for payment source | Admitting: Anesthesiology

## 2013-05-22 DIAGNOSIS — Z170 Estrogen receptor positive status [ER+]: Secondary | ICD-10-CM | POA: Insufficient documentation

## 2013-05-22 DIAGNOSIS — C50212 Malignant neoplasm of upper-inner quadrant of left female breast: Secondary | ICD-10-CM

## 2013-05-22 DIAGNOSIS — Z800 Family history of malignant neoplasm of digestive organs: Secondary | ICD-10-CM | POA: Insufficient documentation

## 2013-05-22 DIAGNOSIS — C50219 Malignant neoplasm of upper-inner quadrant of unspecified female breast: Secondary | ICD-10-CM | POA: Insufficient documentation

## 2013-05-22 DIAGNOSIS — C50919 Malignant neoplasm of unspecified site of unspecified female breast: Secondary | ICD-10-CM

## 2013-05-22 DIAGNOSIS — C773 Secondary and unspecified malignant neoplasm of axilla and upper limb lymph nodes: Secondary | ICD-10-CM | POA: Insufficient documentation

## 2013-05-22 HISTORY — PX: PORTACATH PLACEMENT: SHX2246

## 2013-05-22 SURGERY — INSERTION, TUNNELED CENTRAL VENOUS DEVICE, WITH PORT
Anesthesia: General | Site: Chest | Laterality: Right | Wound class: Clean

## 2013-05-22 MED ORDER — GADOBENATE DIMEGLUMINE 529 MG/ML IV SOLN
20.0000 mL | Freq: Once | INTRAVENOUS | Status: AC | PRN
  Administered 2013-05-22: 20 mL via INTRAVENOUS

## 2013-05-22 MED ORDER — ONDANSETRON HCL 4 MG/2ML IJ SOLN
INTRAMUSCULAR | Status: DC | PRN
  Administered 2013-05-22: 4 mg via INTRAVENOUS

## 2013-05-22 MED ORDER — BUPIVACAINE HCL (PF) 0.25 % IJ SOLN
INTRAMUSCULAR | Status: DC | PRN
  Administered 2013-05-22: 6 mL

## 2013-05-22 MED ORDER — SODIUM CHLORIDE 0.9 % IR SOLN
Status: DC | PRN
  Administered 2013-05-22: 17:00:00

## 2013-05-22 MED ORDER — CEFAZOLIN SODIUM-DEXTROSE 2-3 GM-% IV SOLR
INTRAVENOUS | Status: AC
Start: 2013-05-22 — End: 2013-05-22
  Administered 2013-05-22: 2 g via INTRAVENOUS
  Filled 2013-05-22: qty 50

## 2013-05-22 MED ORDER — FENTANYL CITRATE 0.05 MG/ML IJ SOLN
INTRAMUSCULAR | Status: DC | PRN
  Administered 2013-05-22: 100 ug via INTRAVENOUS
  Administered 2013-05-22: 50 ug via INTRAVENOUS

## 2013-05-22 MED ORDER — LIDOCAINE HCL (CARDIAC) 20 MG/ML IV SOLN
INTRAVENOUS | Status: DC | PRN
  Administered 2013-05-22: 70 mg via INTRAVENOUS

## 2013-05-22 MED ORDER — BUPIVACAINE HCL (PF) 0.25 % IJ SOLN
INTRAMUSCULAR | Status: AC
  Filled 2013-05-22: qty 30

## 2013-05-22 MED ORDER — LACTATED RINGERS IV SOLN
INTRAVENOUS | Status: DC | PRN
  Administered 2013-05-22 (×2): via INTRAVENOUS

## 2013-05-22 MED ORDER — MIDAZOLAM HCL 5 MG/5ML IJ SOLN
INTRAMUSCULAR | Status: DC | PRN
  Administered 2013-05-22: 2 mg via INTRAVENOUS

## 2013-05-22 MED ORDER — PHENYLEPHRINE HCL 10 MG/ML IJ SOLN
INTRAMUSCULAR | Status: DC | PRN
  Administered 2013-05-22: 80 ug via INTRAVENOUS

## 2013-05-22 MED ORDER — LACTATED RINGERS IV SOLN
INTRAVENOUS | Status: DC
  Administered 2013-05-22: 15:00:00 via INTRAVENOUS

## 2013-05-22 MED ORDER — OXYCODONE-ACETAMINOPHEN 5-325 MG PO TABS: 1.0000 | ORAL_TABLET | ORAL | Status: DC | PRN

## 2013-05-22 MED ORDER — HEPARIN SOD (PORK) LOCK FLUSH 100 UNIT/ML IV SOLN
INTRAVENOUS | Status: DC | PRN
  Administered 2013-05-22: 500 [IU] via INTRAVENOUS

## 2013-05-22 MED ORDER — PROPOFOL 10 MG/ML IV BOLUS
INTRAVENOUS | Status: DC | PRN
  Administered 2013-05-22: 200 mg via INTRAVENOUS

## 2013-05-22 MED ORDER — PROMETHAZINE HCL 25 MG/ML IJ SOLN
INTRAMUSCULAR | Status: AC
  Administered 2013-05-22: 6.25 mg
  Filled 2013-05-22: qty 1

## 2013-05-22 MED ORDER — HYDROMORPHONE HCL PF 1 MG/ML IJ SOLN: 0.2500 mg | INTRAMUSCULAR | Status: DC | PRN

## 2013-05-22 MED ORDER — HEPARIN SOD (PORK) LOCK FLUSH 100 UNIT/ML IV SOLN
INTRAVENOUS | Status: AC
  Filled 2013-05-22: qty 5

## 2013-05-22 SURGICAL SUPPLY — 51 items
BAG DECANTER FOR FLEXI CONT (MISCELLANEOUS) ×2 IMPLANT
BLADE SURG 11 STRL SS (BLADE) ×2 IMPLANT
BLADE SURG 15 STRL LF DISP TIS (BLADE) ×1 IMPLANT
BLADE SURG 15 STRL SS (BLADE) ×1
CHLORAPREP W/TINT 26ML (MISCELLANEOUS) ×2 IMPLANT
CLOTH BEACON ORANGE TIMEOUT ST (SAFETY) ×2 IMPLANT
COVER SURGICAL LIGHT HANDLE (MISCELLANEOUS) ×2 IMPLANT
CRADLE DONUT ADULT HEAD (MISCELLANEOUS) ×2 IMPLANT
DECANTER SPIKE VIAL GLASS SM (MISCELLANEOUS) ×2 IMPLANT
DERMABOND ADVANCED (GAUZE/BANDAGES/DRESSINGS) ×1
DERMABOND ADVANCED .7 DNX12 (GAUZE/BANDAGES/DRESSINGS) ×1 IMPLANT
DRAPE C-ARM 42X72 X-RAY (DRAPES) ×2 IMPLANT
DRAPE LAPAROSCOPIC ABDOMINAL (DRAPES) ×2 IMPLANT
DRSG PAD ABDOMINAL 8X10 ST (GAUZE/BANDAGES/DRESSINGS) ×2 IMPLANT
ELECT CAUTERY BLADE 6.4 (BLADE) ×2 IMPLANT
ELECT REM PT RETURN 9FT ADLT (ELECTROSURGICAL) ×2
ELECTRODE REM PT RTRN 9FT ADLT (ELECTROSURGICAL) ×1 IMPLANT
GAUZE SPONGE 4X4 16PLY XRAY LF (GAUZE/BANDAGES/DRESSINGS) ×2 IMPLANT
GLOVE BIO SURGEON STRL SZ7 (GLOVE) ×2 IMPLANT
GLOVE BIOGEL PI IND STRL 7.5 (GLOVE) ×1 IMPLANT
GLOVE BIOGEL PI INDICATOR 7.5 (GLOVE) ×1
GOWN STRL NON-REIN LRG LVL3 (GOWN DISPOSABLE) ×4 IMPLANT
INTRODUCER COOK 11FR (CATHETERS) IMPLANT
KIT BASIN OR (CUSTOM PROCEDURE TRAY) ×2 IMPLANT
KIT PORT POWER 8FR ISP CVUE (Catheter) IMPLANT
KIT PORT POWER 9.6FR MRI PREA (Catheter) IMPLANT
KIT PORT POWER ISP 8FR (Catheter) IMPLANT
KIT POWER CATH 8FR (Catheter) ×2 IMPLANT
KIT ROOM TURNOVER OR (KITS) ×2 IMPLANT
NEEDLE HYPO 25GX1X1/2 BEV (NEEDLE) ×2 IMPLANT
NS IRRIG 1000ML POUR BTL (IV SOLUTION) ×2 IMPLANT
PACK SURGICAL SETUP 50X90 (CUSTOM PROCEDURE TRAY) ×2 IMPLANT
PAD ARMBOARD 7.5X6 YLW CONV (MISCELLANEOUS) ×4 IMPLANT
PENCIL BUTTON HOLSTER BLD 10FT (ELECTRODE) ×2 IMPLANT
SET INTRODUCER 12FR PACEMAKER (SHEATH) IMPLANT
SET SHEATH INTRODUCER 10FR (MISCELLANEOUS) IMPLANT
SHEATH COOK PEEL AWAY SET 9F (SHEATH) IMPLANT
SPONGE GAUZE 4X4 12PLY (GAUZE/BANDAGES/DRESSINGS) ×2 IMPLANT
STAPLER VISISTAT 35W (STAPLE) ×2 IMPLANT
SUT MNCRL AB 4-0 PS2 18 (SUTURE) ×2 IMPLANT
SUT PROLENE 2 0 SH 30 (SUTURE) ×4 IMPLANT
SUT SILK 2 0 (SUTURE)
SUT SILK 2-0 18XBRD TIE 12 (SUTURE) IMPLANT
SUT VIC AB 3-0 SH 27 (SUTURE) ×1
SUT VIC AB 3-0 SH 27XBRD (SUTURE) ×1 IMPLANT
SYR 20ML ECCENTRIC (SYRINGE) ×4 IMPLANT
SYR 5ML LUER SLIP (SYRINGE) ×2 IMPLANT
SYR CONTROL 10ML LL (SYRINGE) IMPLANT
TOWEL OR 17X24 6PK STRL BLUE (TOWEL DISPOSABLE) ×2 IMPLANT
TOWEL OR 17X26 10 PK STRL BLUE (TOWEL DISPOSABLE) ×2 IMPLANT
WATER STERILE IRR 1000ML POUR (IV SOLUTION) IMPLANT

## 2013-05-22 NOTE — Op Note (Signed)
Preoperative diagnosis: Locally advanced left breast cancer Postoperative diagnosis: Same as above Procedure: Right subclavian power port insertion Surgeon: Dr. Harden Mo Anesthesia: Gen. With LMA Estimated blood loss: Minimal Drains: None Specimens: None Complications: None Sponge and Corrected and the operation Disposition to recovery in stable condition  Indications: Is a 42 year old female who has a locally advanced left breast cancer. This tumor is growing out of her skin. She has undergone evaluation and this appears to be about 14 cm mass and her lymph nodes are positive. She is pending a metastatic evaluation. We discussed proceeding with chemotherapy as her first at the treatment. We discussed the risk benefits of port placement.  Procedure: After informed consent was obtained the patient was taken to the operating room. She was administered 2 g of intravenous cefazolin. Sequential compression devices were placed on her legs for DVT prophylaxis. Her arms were tucked and appropriately padded. She was placed under general anesthesia with an LMA. Her chest was prepped and draped in the standard sterile surgical fashion. A surgical timeout was then performed.  I was able to access her subclavian vein and pass the wire. This was confirmed to be in good position by fluoroscopy. I then made a pocket below this. I made this pocket subcutaneous in nature due to her body habitus. If I put this on her pec fascia then it would not be palpable. I then tunneled a line between the 2 sites. I then dilated up the tract. I then placed the dilator and sheath into position using fluoroscopy. I removed the wire assembly. I then placed the line. I removed the peel-away sheath. I then pulled the line back to be in the distal cava. I then attached to the port. Initially when I done this there was a little but of redundancy and it was not flushing well. I cut off a little bit more of the line and then secured  this and it worked well after that. It flushed easily and aspirated blood. I placed heparin in port and line.I then closed with 3-0 Vicryl, 4-0 Monocryl, and Dermabond. She tolerated this well was extubated and transferred to recovery stable.

## 2013-05-22 NOTE — Anesthesia Preprocedure Evaluation (Addendum)
Anesthesia Evaluation  Patient identified by MRN, date of birth, ID band Patient awake    Reviewed: Allergy & Precautions, H&P , NPO status , Patient's Chart, lab work & pertinent test results  Airway Mallampati: II      Dental  (+) Dental Advisory Given   Pulmonary neg pulmonary ROS,  breath sounds clear to auscultation        Cardiovascular negative cardio ROS  Rhythm:Regular Rate:Normal     Neuro/Psych  Headaches,    GI/Hepatic negative GI ROS, Neg liver ROS,   Endo/Other  negative endocrine ROSMorbid obesity  Renal/GU negative Renal ROS     Musculoskeletal   Abdominal   Peds  Hematology   Anesthesia Other Findings   Reproductive/Obstetrics                          Anesthesia Physical Anesthesia Plan  ASA: III  Anesthesia Plan: General   Post-op Pain Management:    Induction: Intravenous  Airway Management Planned: LMA  Additional Equipment:   Intra-op Plan:   Post-operative Plan: Extubation in OR  Informed Consent: I have reviewed the patients History and Physical, chart, labs and discussed the procedure including the risks, benefits and alternatives for the proposed anesthesia with the patient or authorized representative who has indicated his/her understanding and acceptance.   Dental advisory given  Plan Discussed with: CRNA, Anesthesiologist and Surgeon  Anesthesia Plan Comments:         Anesthesia Quick Evaluation

## 2013-05-22 NOTE — H&P (View-Only) (Signed)
Patient ID: Erika [name], female   DOB: 08/13/1971, 42 y.o.   MRN: [id number]  Chief Complaint  Patient presents with  . Breast Cancer    HPI Erika [name] is a 42 y.o. female.   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  2 months ago this began getting larger much quicker.  She eventually was seen in an urgent care where this mass was thought to be an abscess.  Apparently there was an attempt to excise or drain this that was not successful and she was referred to our office for evaluation.  She was seen in our urgent office and underwent punch biopsy of her skin and the mass.  She was then also sent for mm/us.  The biopsy shows invasive high grade carcinoma that appears to be breast.  It is her2 not amplified, er pos at 15, pr neg and Ki approaches 100%.  She has also undergone mm with at least 10 cm upper inner left breast tumor with mild overyling skin thickening.  There is also a 3x10 mm cluster of calcs near there also.  There is indeterminate 7 mm level 1 left axillary node.  No mm evidence of right breast malignancy.  She comes in today after seeing Dr. Squire of radiation therapy for evaluation.  Past Medical History  Diagnosis Date  . Breast cancer     Past Surgical History  Procedure Laterality Date  . Tubal ligation      Family History  Problem Relation Age of Onset  . Hypertension Mother   . Diabetes Father   . Cancer Maternal Aunt     stomach    Social History History  Substance Use Topics  . Smoking status: Never Smoker   . Smokeless tobacco: Never Used  . Alcohol Use: No    No Known Allergies  Current Outpatient Prescriptions  Medication Sig Dispense Refill  . ibuprofen (ADVIL,MOTRIN) 200 MG tablet Take 200 mg by mouth every 6 (six) hours as needed for pain (600-800 mg prn left breast pain).       No current facility-administered medications for this visit.    Review of  Systems Review of Systems  Constitutional: Negative for fever, chills and unexpected weight change.  HENT: Negative for hearing loss, congestion, sore throat, trouble swallowing and voice change.   Eyes: Negative for visual disturbance.  Respiratory: Negative for cough and wheezing.   Cardiovascular: Negative for chest pain, palpitations and leg swelling.  Gastrointestinal: Negative for nausea, vomiting, abdominal pain, diarrhea, constipation, blood in stool, abdominal distention and anal bleeding.  Genitourinary: Negative for hematuria, vaginal bleeding and difficulty urinating.  Musculoskeletal: Negative for arthralgias.  Skin: Negative for rash and wound.  Neurological: Negative for seizures, syncope and headaches.  Hematological: Negative for adenopathy. Does not bruise/bleed easily.  Psychiatric/Behavioral: Negative for confusion.    Blood pressure 118/68, pulse 70, temperature 98.6 F (37 C), temperature source Temporal, resp. rate 12, height 5' 5.5" (1.664 m), weight 229 lb 9.6 oz (104.146 kg).  Physical Exam Physical Exam  Vitals reviewed. Constitutional: She appears well-developed and well-nourished.  Cardiovascular: Normal rate, regular rhythm and normal heart sounds.   Pulmonary/Chest: Effort normal and breath sounds normal. She has no wheezes. She has no rales. Right breast exhibits no inverted nipple, no mass, no nipple discharge, no skin change and no tenderness. Left breast exhibits mass, skin change and tenderness. Left breast exhibits no inverted nipple. Breasts   are symmetrical.    Abdominal: Soft.  Lymphadenopathy:    She has no cervical adenopathy.    She has axillary adenopathy.       Right axillary: No pectoral and no lateral adenopathy present.       Left axillary: Lateral adenopathy present. No pectoral adenopathy present.      Right: No supraclavicular adenopathy present.       Left: No supraclavicular adenopathy present.    Data Reviewed DIGITAL  DIAGNOSTIC BILATERAL MAMMOGRAM WITH CAD AND LEFT BREAST  ULTRASOUND:  Comparison: None.  Findings:  ACR Breast Density Category 2: There is a scattered fibroglandular  pattern.  A large irregular mass within the upper inner posterior left breast  is identified - measuring at least 10 cm. Mild overlying skin  thickening is identified compatible with dermal invasion.  A 3 x 10 mm cluster of slightly heterogeneous left breast  calcifications approximate 4 cm anterior and lateral to this mass  is identified.  There is no evidence of suspicious mass, distortion or worrisome  calcifications within the right breast.  Mammographic images were processed with CAD.  On physical exam, a very large protuberant mass within the upper  inner left breast is identified.  Ultrasound is performed, showing a large heterogeneous mass within  the upper inner left breast at the 10-11 o'clock position,  measuring at least 6.5 cm, but evaluation is limited by transducer  skin area and protuberant portion of the mass.  A 7 mm level I left axillary lymph node is identified with slightly  echogenic rim - suspicious.  IMPRESSION:  Large biopsy-proven carcinoma within the upper inner left breast,  measuring at least 10 cm mammographically. Mild overlying skin  thickening compatible with dermal invasion.  3 x 10 mm cluster of indeterminate left breast calcifications 4 cm  anterior and lateral to the biopsy-proven neoplasm. This may  represent DCIS and consider tissue sampling.  Indeterminate 7 mm level I left axillary lymph node - metastatic  disease not excluded.  No mammographic evidence of right breast malignancy.   Assessment    Locally advanced left breast cancer     Plan    Staging studies in conjunction with med onc (she sees Dr Khan on Thursday), I will plan port placement as I think she needs to start chemo asap, mri breast, biopsy node on left, genetics   We discussed the staging and  pathophysiology of breast cancer. We discussed all of the different options for treatment for breast cancer including surgery, chemotherapy, radiation therapy, Herceptin, and antiestrogen therapy.  She essentially has tnbc with high proliferation index and we discussed aggressive nature of this tumor.  We discussed talking to financial counselor when she goes to [facility] on Thursday also.  She could also have stage IV disease and needs staging studies.   We discussed further evaluation of her nodes prior to determining eventual surgical treatment of her nodes. I think she will likely need mastectomy at time of surgery but will wait for more info with mr and possibly more biopsies.  I think she needs primary chemotherapy without a doubt due to locally advanced nature and this is likely systemic disease already. We discussed the risks of operation including bleeding, infection, possible reoperation, ptx          Agamjot Kilgallon 05/15/2013, 4:05 PM    

## 2013-05-22 NOTE — Transfer of Care (Signed)
Immediate Anesthesia Transfer of Care Note  Patient: Erika [name]  Procedure(s) Performed: Procedure(s): INSERTION PORT-A-CATH (Right)  Patient Location: PACU  Anesthesia Type:General  Level of Consciousness: awake, alert , oriented and patient cooperative  Airway & Oxygen Therapy: Patient Spontanous Breathing and Patient connected to nasal cannula oxygen  Post-op Assessment: Report given to PACU RN, Post -op Vital signs reviewed and stable and Patient moving all extremities X 4  Post vital signs: Reviewed and stable  Complications: No apparent anesthesia complications

## 2013-05-22 NOTE — Interval H&P Note (Signed)
History and Physical Interval Note:  05/22/2013 3:40 PM  Erika [name]  has presented today for surgery, with the diagnosis of place port for chemotherapy  The various methods of treatment have been discussed with the patient and family. After consideration of risks, benefits and other options for treatment, the patient has consented to  Procedure(s): INSERTION PORT-A-CATH (N/A) as a surgical intervention .  The patient's history has been reviewed, patient examined, no change in status, stable for surgery.  I have reviewed the patient's chart and labs.  Questions were answered to the patient's satisfaction.     [name]

## 2013-05-22 NOTE — Progress Notes (Signed)
Pt became nauseated when mobilized to wc. Received verbal order from  Dr Michelle Piper to give phenergan 6.25 mg.

## 2013-05-22 NOTE — Preoperative (Signed)
Beta Blockers   Reason not to administer Beta Blockers:Not Applicable 

## 2013-05-22 NOTE — Anesthesia Procedure Notes (Signed)
Procedure Name: LMA Insertion Date/Time: 05/22/2013 4:15 PM Performed by: Rogelia Boga Pre-anesthesia Checklist: Patient identified, Emergency Drugs available, Suction available, Patient being monitored and Timeout performed Patient Re-evaluated:Patient Re-evaluated prior to inductionOxygen Delivery Method: Circle system utilized Preoxygenation: Pre-oxygenation with 100% oxygen Intubation Type: IV induction LMA: LMA inserted LMA Size: 4.0 Number of attempts: 1 Placement Confirmation: positive ETCO2 and breath sounds checked- equal and bilateral Tube secured with: Tape Dental Injury: Teeth and Oropharynx as per pre-operative assessment

## 2013-05-22 NOTE — Anesthesia Postprocedure Evaluation (Signed)
  Anesthesia Post-op Note  Patient: Erika [name]  Procedure(s) Performed: Procedure(s): INSERTION PORT-A-CATH (Right)  Patient Location: PACU  Anesthesia Type:General  Level of Consciousness: awake  Airway and Oxygen Therapy: Patient Spontanous Breathing  Post-op Pain: mild  Post-op Assessment: Post-op Vital signs reviewed  Post-op Vital Signs: Reviewed  Complications: No apparent anesthesia complications

## 2013-05-23 ENCOUNTER — Telehealth: Payer: Self-pay | Admitting: *Deleted

## 2013-05-23 ENCOUNTER — Telehealth: Payer: Self-pay | Admitting: Oncology

## 2013-05-23 ENCOUNTER — Telehealth (INDEPENDENT_AMBULATORY_CARE_PROVIDER_SITE_OTHER): Payer: Self-pay | Admitting: General Surgery

## 2013-05-23 ENCOUNTER — Other Ambulatory Visit: Payer: Self-pay | Admitting: Emergency Medicine

## 2013-05-23 DIAGNOSIS — C50219 Malignant neoplasm of upper-inner quadrant of unspecified female breast: Secondary | ICD-10-CM

## 2013-05-23 NOTE — Telephone Encounter (Signed)
Per staff message and POF I have scheduled appts.  JMW  

## 2013-05-23 NOTE — Telephone Encounter (Signed)
LMOM making patient aware of post op appt made 06/12/2013 at 9:00 am. To call if this is not a good date/time.

## 2013-05-23 NOTE — Telephone Encounter (Signed)
, °

## 2013-05-24 ENCOUNTER — Other Ambulatory Visit: Payer: No Typology Code available for payment source

## 2013-05-24 ENCOUNTER — Other Ambulatory Visit: Payer: Self-pay | Admitting: *Deleted

## 2013-05-24 ENCOUNTER — Ambulatory Visit (HOSPITAL_COMMUNITY)
Admission: RE | Admit: 2013-05-24 | Discharge: 2013-05-24 | Disposition: A | Discharge status: Home / Self Care | Payer: No Typology Code available for payment source | Source: Ambulatory Visit | Attending: Oncology | Admitting: Oncology

## 2013-05-24 ENCOUNTER — Encounter: Payer: Self-pay | Admitting: *Deleted

## 2013-05-24 ENCOUNTER — Encounter (HOSPITAL_COMMUNITY): Payer: Self-pay | Admitting: General Surgery

## 2013-05-24 DIAGNOSIS — C50919 Malignant neoplasm of unspecified site of unspecified female breast: Secondary | ICD-10-CM

## 2013-05-24 DIAGNOSIS — Z01818 Encounter for other preprocedural examination: Secondary | ICD-10-CM | POA: Insufficient documentation

## 2013-05-24 DIAGNOSIS — C50219 Malignant neoplasm of upper-inner quadrant of unspecified female breast: Secondary | ICD-10-CM | POA: Insufficient documentation

## 2013-05-24 DIAGNOSIS — Z5111 Encounter for antineoplastic chemotherapy: Secondary | ICD-10-CM

## 2013-05-24 MED ORDER — PROCHLORPERAZINE MALEATE 10 MG PO TABS: 10.0000 mg | ORAL_TABLET | Freq: Four times a day (QID) | ORAL | Status: DC | PRN

## 2013-05-24 MED ORDER — LORAZEPAM 0.5 MG PO TABS: 0.5000 mg | ORAL_TABLET | Freq: Three times a day (TID) | ORAL | Status: DC

## 2013-05-24 MED ORDER — LIDOCAINE-PRILOCAINE 2.5-2.5 % EX CREA: TOPICAL_CREAM | CUTANEOUS | Status: DC | PRN

## 2013-05-24 MED ORDER — ONDANSETRON HCL 8 MG PO TABS: ORAL_TABLET | ORAL | Status: DC

## 2013-05-24 MED ORDER — DEXAMETHASONE 4 MG PO TABS: ORAL_TABLET | ORAL | Status: DC

## 2013-05-24 NOTE — Progress Notes (Signed)
  Echocardiogram 2D Echocardiogram has been performed.  Erika [name] 05/24/2013, 12:52 PM

## 2013-05-24 NOTE — Telephone Encounter (Signed)
Left patient message to let her know I called in her prescriptions and if she had any questions to call.

## 2013-05-25 ENCOUNTER — Ambulatory Visit (HOSPITAL_BASED_OUTPATIENT_CLINIC_OR_DEPARTMENT_OTHER): Payer: No Typology Code available for payment source

## 2013-05-25 ENCOUNTER — Other Ambulatory Visit (HOSPITAL_BASED_OUTPATIENT_CLINIC_OR_DEPARTMENT_OTHER): Payer: No Typology Code available for payment source | Admitting: Lab

## 2013-05-25 ENCOUNTER — Ambulatory Visit (HOSPITAL_BASED_OUTPATIENT_CLINIC_OR_DEPARTMENT_OTHER): Payer: No Typology Code available for payment source | Admitting: Oncology

## 2013-05-25 ENCOUNTER — Encounter: Payer: Self-pay | Admitting: Oncology

## 2013-05-25 ENCOUNTER — Other Ambulatory Visit: Payer: Self-pay | Admitting: Emergency Medicine

## 2013-05-25 ENCOUNTER — Ambulatory Visit: Payer: No Typology Code available for payment source

## 2013-05-25 VITALS — BP 119/81 | HR 91 | Temp 98.3°F | Resp 18 | Ht 65.5 in | Wt 231.9 lb

## 2013-05-25 DIAGNOSIS — C50919 Malignant neoplasm of unspecified site of unspecified female breast: Secondary | ICD-10-CM

## 2013-05-25 DIAGNOSIS — D509 Iron deficiency anemia, unspecified: Secondary | ICD-10-CM

## 2013-05-25 DIAGNOSIS — C50212 Malignant neoplasm of upper-inner quadrant of left female breast: Secondary | ICD-10-CM

## 2013-05-25 DIAGNOSIS — C50219 Malignant neoplasm of upper-inner quadrant of unspecified female breast: Secondary | ICD-10-CM

## 2013-05-25 DIAGNOSIS — Z170 Estrogen receptor positive status [ER+]: Secondary | ICD-10-CM

## 2013-05-25 DIAGNOSIS — C773 Secondary and unspecified malignant neoplasm of axilla and upper limb lymph nodes: Secondary | ICD-10-CM

## 2013-05-25 DIAGNOSIS — Z5111 Encounter for antineoplastic chemotherapy: Secondary | ICD-10-CM

## 2013-05-25 LAB — COMPREHENSIVE METABOLIC PANEL (CC13)
ALT: 14 U/L (ref 0–55)
AST: 14 U/L (ref 5–34)
Albumin: 2.4 g/dL — ABNORMAL LOW (ref 3.5–5.0)
Calcium: 9.5 mg/dL (ref 8.4–10.4)
Chloride: 105 mEq/L (ref 98–107)
Potassium: 3.5 mEq/L (ref 3.5–5.1)
Sodium: 140 mEq/L (ref 136–145)
Total Protein: 7.7 g/dL (ref 6.4–8.3)

## 2013-05-25 LAB — CBC WITH DIFFERENTIAL/PLATELET
BASO%: 0.1 % (ref 0.0–2.0)
Eosinophils Absolute: 0.3 10*3/uL (ref 0.0–0.5)
MCHC: 30.8 g/dL — ABNORMAL LOW (ref 31.5–36.0)
MONO#: 0.4 10*3/uL (ref 0.1–0.9)
NEUT#: 6.3 10*3/uL (ref 1.5–6.5)
RBC: 3.44 10*6/uL — ABNORMAL LOW (ref 3.70–5.45)
WBC: 8.6 10*3/uL (ref 3.9–10.3)
lymph#: 1.6 10*3/uL (ref 0.9–3.3)
nRBC: 0 % (ref 0–0)

## 2013-05-25 LAB — IRON AND TIBC

## 2013-05-25 SURGERY — INSERTION, TUNNELED CENTRAL VENOUS DEVICE, WITH PORT
Anesthesia: General

## 2013-05-25 MED ORDER — SODIUM CHLORIDE 0.9 % IV SOLN
Freq: Once | INTRAVENOUS | Status: AC
  Administered 2013-05-25: 15:00:00 via INTRAVENOUS

## 2013-05-25 MED ORDER — FOSAPREPITANT DIMEGLUMINE INJECTION 150 MG
150.0000 mg | Freq: Once | INTRAVENOUS | Status: AC
  Administered 2013-05-25: 150 mg via INTRAVENOUS
  Filled 2013-05-25: qty 5

## 2013-05-25 MED ORDER — DEXAMETHASONE SODIUM PHOSPHATE 20 MG/5ML IJ SOLN
12.0000 mg | Freq: Once | INTRAMUSCULAR | Status: AC
  Administered 2013-05-25: 12 mg via INTRAVENOUS

## 2013-05-25 MED ORDER — SODIUM CHLORIDE 0.9 % IJ SOLN
10.0000 mL | INTRAMUSCULAR | Status: DC | PRN
  Administered 2013-05-25: 10 mL
  Filled 2013-05-25: qty 10

## 2013-05-25 MED ORDER — DOXORUBICIN HCL CHEMO IV INJECTION 2 MG/ML
60.0000 mg/m2 | Freq: Once | INTRAVENOUS | Status: AC
  Administered 2013-05-25: 132 mg via INTRAVENOUS
  Filled 2013-05-25: qty 66

## 2013-05-25 MED ORDER — PALONOSETRON HCL INJECTION 0.25 MG/5ML
0.2500 mg | Freq: Once | INTRAVENOUS | Status: AC
  Administered 2013-05-25: 0.25 mg via INTRAVENOUS

## 2013-05-25 MED ORDER — SODIUM CHLORIDE 0.9 % IV SOLN
600.0000 mg/m2 | Freq: Once | INTRAVENOUS | Status: AC
  Administered 2013-05-25: 1320 mg via INTRAVENOUS
  Filled 2013-05-25: qty 66

## 2013-05-25 MED ORDER — HEPARIN SOD (PORK) LOCK FLUSH 100 UNIT/ML IV SOLN
500.0000 [IU] | Freq: Once | INTRAVENOUS | Status: AC | PRN
  Administered 2013-05-25: 500 [IU]
  Filled 2013-05-25: qty 5

## 2013-05-25 MED ORDER — LORAZEPAM 2 MG/ML IJ SOLN
0.5000 mg | Freq: Once | INTRAMUSCULAR | Status: AC
  Administered 2013-05-25: 0.5 mg via INTRAVENOUS

## 2013-05-25 NOTE — Patient Instructions (Addendum)
Proceed with chemotherapy today  You will come back in 1 week for follow up  Please take oral iron twice a day  Take your anti-nausea medications as prescribed

## 2013-05-25 NOTE — Patient Instructions (Addendum)
[facility] Discharge Instructions for Patients Receiving Chemotherapy  Today you received the following chemotherapy agents: adriamycin, cytoxan  To help prevent nausea and vomiting after your treatment, we encourage you to take your nausea medication.  Take it as often as prescribed.     If you develop nausea and vomiting that is not controlled by your nausea medication, call the clinic. If it is after clinic hours your family physician or the after hours number for the clinic or go to the Emergency Department.   BELOW ARE SYMPTOMS THAT SHOULD BE REPORTED IMMEDIATELY:  *FEVER GREATER THAN 100.5 F  *CHILLS WITH OR WITHOUT FEVER  NAUSEA AND VOMITING THAT IS NOT CONTROLLED WITH YOUR NAUSEA MEDICATION  *UNUSUAL SHORTNESS OF BREATH  *UNUSUAL BRUISING OR BLEEDING  TENDERNESS IN MOUTH AND THROAT WITH OR WITHOUT PRESENCE OF ULCERS  *URINARY PROBLEMS  *BOWEL PROBLEMS  UNUSUAL RASH Items with * indicate a potential emergency and should be followed up as soon as possible.  One of the nurses will contact you 24 hours after your treatment. Please let the nurse know about any problems that you may have experienced. Feel free to call the clinic you have any questions or concerns. The clinic phone number is [phone].   I have been informed and understand all the instructions given to me. I know to contact the clinic, my physician, or go to the Emergency Department if any problems should occur. I do not have any questions at this time, but understand that I may call the clinic during office hours   should I have any questions or need assistance in obtaining follow up care.    __________________________________________  _____________  __________ Signature of Patient or Authorized Representative            Date                   Time    __________________________________________ Nurse's Signature   Cyclophosphamide injection (Cytoxan)  What is this  medicine? CYCLOPHOSPHAMIDE (sye kloe FOSS fa mide) is a chemotherapy drug. It slows the growth of cancer cells. This medicine is used to treat many types of cancer like lymphoma, myeloma, leukemia, breast cancer, and ovarian cancer, to name a few. It is also used to treat nephrotic syndrome in children. This medicine may be used for other purposes; ask your health care provider or pharmacist if you have questions. What should I tell my health care provider before I take this medicine? They need to know if you have any of these conditions: -blood disorders -history of other chemotherapy -history of radiation therapy -infection -kidney disease -liver disease -tumors in the bone marrow -an unusual or allergic reaction to cyclophosphamide, other chemotherapy, other medicines, foods, dyes, or preservatives -pregnant or trying to get pregnant -breast-feeding How should I use this medicine? This drug is usually given as an injection into a vein or muscle or by infusion into a vein. It is administered in a hospital or clinic by a specially trained health care professional. Talk to your pediatrician regarding the use of this medicine in children. While this drug may be prescribed for selected conditions, precautions do apply. Overdosage: If you think you have taken too much of this medicine contact a poison control center or emergency room at once. NOTE: This medicine is only for you. Do not share this medicine with others. What if I miss a dose? It is important not to miss your dose. Call your doctor or health care  professional if you are unable to keep an appointment. What may interact with this medicine? Do not take this medicine with any of the following medications: -mibefradil -nalidixic acid This medicine may also interact with the following medications: -doxorubicin -etanercept -medicines to increase blood counts like filgrastim, pegfilgrastim, sargramostim -medicines that block muscle  or nerve pain -St. John's Wort -phenobarbital -succinylcholine chloride -trastuzumab -vaccines Talk to your doctor or health care professional before taking any of these medicines: -acetaminophen -aspirin -ibuprofen -ketoprofen -naproxen This list may not describe all possible interactions. Give your health care provider a list of all the medicines, herbs, non-prescription drugs, or dietary supplements you use. Also tell them if you smoke, drink alcohol, or use illegal drugs. Some items may interact with your medicine. What should I watch for while using this medicine? Visit your doctor for checks on your progress. This drug may make you feel generally unwell. This is not uncommon, as chemotherapy can affect healthy cells as well as cancer cells. Report any side effects. Continue your course of treatment even though you feel ill unless your doctor tells you to stop. Drink water or other fluids as directed. Urinate often, even at night. In some cases, you may be given additional medicines to help with side effects. Follow all directions for their use. Call your doctor or health care professional for advice if you get a fever, chills or sore throat, or other symptoms of a cold or flu. Do not treat yourself. This drug decreases your body's ability to fight infections. Try to avoid being around people who are sick. This medicine may increase your risk to bruise or bleed. Call your doctor or health care professional if you notice any unusual bleeding. Be careful brushing and flossing your teeth or using a toothpick because you may get an infection or bleed more easily. If you have any dental work done, tell your dentist you are receiving this medicine. Avoid taking products that contain aspirin, acetaminophen, ibuprofen, naproxen, or ketoprofen unless instructed by your doctor. These medicines may hide a fever. Do not become pregnant while taking this medicine. Women should inform their doctor if  they wish to become pregnant or think they might be pregnant. There is a potential for serious side effects to an unborn child. Talk to your health care professional or pharmacist for more information. Do not breast-feed an infant while taking this medicine. Men should inform their doctor if they wish to father a child. This medicine may lower sperm counts. If you are going to have surgery, tell your doctor or health care professional that you have taken this medicine. What side effects may I notice from receiving this medicine? Side effects that you should report to your doctor or health care professional as soon as possible: -allergic reactions like skin rash, itching or hives, swelling of the face, lips, or tongue -low blood counts - this medicine may decrease the number of white blood cells, red blood cells and platelets. You may be at increased risk for infections and bleeding. -signs of infection - fever or chills, cough, sore throat, pain or difficulty passing urine -signs of decreased platelets or bleeding - bruising, pinpoint red spots on the skin, black, tarry stools, blood in the urine -signs of decreased red blood cells - unusually weak or tired, fainting spells, lightheadedness -breathing problems -dark urine -mouth sores -pain, swelling, redness at site where injected -swelling of the ankles, feet, hands -trouble passing urine or change in the amount of urine -weight gain -  yellowing of the eyes or skin Side effects that usually do not require medical attention (report to your doctor or health care professional if they continue or are bothersome): -changes in nail or skin color -diarrhea -hair loss -loss of appetite -missed menstrual periods -nausea, vomiting -stomach pain This list may not describe all possible side effects. Call your doctor for medical advice about side effects. You may report side effects to FDA at 1-800-FDA-1088. Where should I keep my medicine? This drug  is given in a hospital or clinic and will not be stored at home. NOTE: This sheet is a summary. It may not cover all possible information. If you have questions about this medicine, talk to your doctor, pharmacist, or health care provider.  2012, [employer]. (03/05/2008 2:32:25 PM)   Doxorubicin injection (Adriamycin) What is this medicine? DOXORUBICIN (dox oh ROO bi sin) is a chemotherapy drug. It is used to treat many kinds of cancer like Hodgkin's disease, leukemia, non-Hodgkin's lymphoma, neuroblastoma, sarcoma, and Wilms' tumor. It is also used to treat bladder cancer, breast cancer, lung cancer, ovarian cancer, stomach cancer, and thyroid cancer. This medicine may be used for other purposes; ask your health care provider or pharmacist if you have questions. What should I tell my health care provider before I take this medicine? They need to know if you have any of these conditions: -blood disorders -heart disease, recent heart attack -infection (especially a virus infection such as chickenpox, cold sores, or herpes) -irregular heartbeat -liver disease -recent or ongoing radiation therapy -an unusual or allergic reaction to doxorubicin, other chemotherapy agents, other medicines, foods, dyes, or preservatives -pregnant or trying to get pregnant -breast-feeding How should I use this medicine? This drug is given as an infusion into a vein. It is administered in a hospital or clinic by a specially trained health care professional. If you have pain, swelling, burning or any unusual feeling around the site of your injection, tell your health care professional right away. Talk to your pediatrician regarding the use of this medicine in children. Special care may be needed. Overdosage: If you think you have taken too much of this medicine contact a poison control center or emergency room at once. NOTE: This medicine is only for you. Do not share this medicine with others. What if I  miss a dose? It is important not to miss your dose. Call your doctor or health care professional if you are unable to keep an appointment. What may interact with this medicine? Do not take this medicine with any of the following medications: -cisapride -droperidol -halofantrine -pimozide -zidovudine This medicine may also interact with the following medications: -chloroquine -chlorpromazine -clarithromycin -cyclophosphamide -cyclosporine -erythromycin -medicines for depression, anxiety, or psychotic disturbances -medicines for irregular heart beat like amiodarone, bepridil, dofetilide, encainide, flecainide, propafenone, quinidine -medicines for seizures like ethotoin, fosphenytoin, phenytoin -medicines for nausea, vomiting like dolasetron, ondansetron, palonosetron -medicines to increase blood counts like filgrastim, pegfilgrastim, sargramostim -methadone -methotrexate -pentamidine -progesterone -vaccines -verapamil Talk to your doctor or health care professional before taking any of these medicines: -acetaminophen -aspirin -ibuprofen -ketoprofen -naproxen This list may not describe all possible interactions. Give your health care provider a list of all the medicines, herbs, non-prescription drugs, or dietary supplements you use. Also tell them if you smoke, drink alcohol, or use illegal drugs. Some items may interact with your medicine. What should I watch for while using this medicine? Your condition will be monitored carefully while you are receiving this medicine. You will need important  blood work done while you are taking this medicine. This drug may make you feel generally unwell. This is not uncommon, as chemotherapy can affect healthy cells as well as cancer cells. Report any side effects. Continue your course of treatment even though you feel ill unless your doctor tells you to stop. Your urine may turn red for a few days after your dose. This is not blood. If your  urine is dark or brown, call your doctor. In some cases, you may be given additional medicines to help with side effects. Follow all directions for their use. Call your doctor or health care professional for advice if you get a fever, chills or sore throat, or other symptoms of a cold or flu. Do not treat yourself. This drug decreases your body's ability to fight infections. Try to avoid being around people who are sick. This medicine may increase your risk to bruise or bleed. Call your doctor or health care professional if you notice any unusual bleeding. Be careful brushing and flossing your teeth or using a toothpick because you may get an infection or bleed more easily. If you have any dental work done, tell your dentist you are receiving this medicine. Avoid taking products that contain aspirin, acetaminophen, ibuprofen, naproxen, or ketoprofen unless instructed by your doctor. These medicines may hide a fever. Men and women of childbearing age should use effective birth control methods while using taking this medicine. Do not become pregnant while taking this medicine. There is a potential for serious side effects to an unborn child. Talk to your health care professional or pharmacist for more information. Do not breast-feed an infant while taking this medicine. Do not let others touch your urine or other body fluids for 5 days after each treatment with this medicine. Caregivers should wear latex gloves to avoid touching body fluids during this time. What side effects may I notice from receiving this medicine? Side effects that you should report to your doctor or health care professional as soon as possible: -allergic reactions like skin rash, itching or hives, swelling of the face, lips, or tongue -low blood counts - this medicine may decrease the number of white blood cells, red blood cells and platelets. You may be at increased risk for infections and bleeding. -signs of infection - fever or  chills, cough, sore throat, pain or difficulty passing urine -signs of decreased platelets or bleeding - bruising, pinpoint red spots on the skin, black, tarry stools, blood in the urine -signs of decreased red blood cells - unusually weak or tired, fainting spells, lightheadedness -breathing problems -chest pain -fast, irregular heartbeat -mouth sores -nausea, vomiting -pain, swelling, redness at site where injected -pain, tingling, numbness in the hands or feet -swelling of ankles, feet, or hands -unusual bleeding or bruising Side effects that usually do not require medical attention (report to your doctor or health care professional if they continue or are bothersome): -diarrhea -facial flushing -hair loss -loss of appetite -missed menstrual periods -nail discoloration or damage -red or watery eyes -red colored urine -stomach upset This list may not describe all possible side effects. Call your doctor for medical advice about side effects. You may report side effects to FDA at 1-800-FDA-1088. Where should I keep my medicine? This drug is given in a hospital or clinic and will not be stored at home. NOTE: This sheet is a summary. It may not cover all possible information. If you have questions about this medicine, talk to your doctor, pharmacist, or health  care provider.  2012, [employer]. (03/19/2008 5:07:32 PM)    Filgrastim, G-CSF injection What is this medicine? FILGRASTIM, G-CSF (fil GRA stim) stimulates the formation of white blood cells. This medicine is given to patients with conditions that may cause a decrease in white blood cells, like those receiving certain types of chemotherapy or bone marrow transplant. It helps the bone marrow recover its ability to produce white blood cells. Increasing the amount of white blood cells helps to decrease the risk of infection and fever. This medicine may be used for other purposes; ask your health care provider or  pharmacist if you have questions. What should I tell my health care provider before I take this medicine? They need to know if you have any of these conditions: -currently receiving radiation therapy -sickle cell disease -an unusual or allergic reaction to filgrastim, E. coli protein, other medicines, foods, dyes, or preservatives -pregnant or trying to get pregnant -breast-feeding How should I use this medicine? This medicine is for injection into a vein or injection under the skin. It is usually given by a health care professional in a hospital or clinic setting. If you get this medicine at home, you will be taught how to prepare and give this medicine. Always change the site for the injection under the skin. Let the solution warm to room temperature before you use it. Do not shake the solution before you withdraw a dose. Throw away any unused portion. Use exactly as directed. Take your medicine at regular intervals. Do not take your medicine more often than directed. It is important that you put your used needles and syringes in a special sharps container. Do not put them in a trash can. If you do not have a sharps container, call your pharmacist or healthcare provider to get one. Talk to your pediatrician regarding the use of this medicine in children. While this medicine may be prescribed for children for selected conditions, precautions do apply. Overdosage: If you think you have taken too much of this medicine contact a poison control center or emergency room at once. NOTE: This medicine is only for you. Do not share this medicine with others. What if I miss a dose? Try not to miss doses. If you miss a dose take the dose as soon as you remember. If it is almost time for the next dose, do not take double doses unless told to by your doctor or health care professional. What may interact with this medicine? -lithium -medicines for cancer chemotherapy This list may not describe all possible  interactions. Give your health care provider a list of all the medicines, herbs, non-prescription drugs, or dietary supplements you use. Also tell them if you smoke, drink alcohol, or use illegal drugs. Some items may interact with your medicine. What should I watch for while using this medicine? Visit your doctor or health care professional for regular checks on your progress. If you get a fever or any sign of infection while you are using this medicine, do not treat yourself. Check with your doctor or health care professional. Bone pain can usually be relieved by mild pain relievers such as acetaminophen or ibuprofen. Check with your doctor or health care professional before taking these medicines as they may hide a fever. Call your doctor or health care professional if the aches and pains are severe or do not go away. What side effects may I notice from receiving this medicine? Side effects that you should report to your doctor or  health care professional as soon as possible: -allergic reactions like skin rash, itching or hives, swelling of the face, lips, or tongue -difficulty breathing, wheezing -fever -pain, redness, or swelling at the injection site -stomach or side pain, or pain at the shoulder Side effects that usually do not require medical attention (report to your doctor or health care professional if they continue or are bothersome): -bone pain (ribs, lower back, breast bone) -headache -skin rash This list may not describe all possible side effects. Call your doctor for medical advice about side effects. You may report side effects to FDA at 1-800-FDA-1088. Where should I keep my medicine? Keep out of the reach of children. Store in a refrigerator between 2 and 8 degrees C (36 and 46 degrees F). Do not freeze or leave in direct sunlight. If vials or syringes are left out of the refrigerator for more than 24 hours, they must be thrown away. Throw away unused vials after the expiration  date on the carton. NOTE: This sheet is a summary. It may not cover all possible information. If you have questions about this medicine, talk to your doctor, pharmacist, or health care provider.  2013, [employer]. (02/14/2008 1:33:21 PM)

## 2013-05-26 ENCOUNTER — Ambulatory Visit (HOSPITAL_BASED_OUTPATIENT_CLINIC_OR_DEPARTMENT_OTHER): Payer: No Typology Code available for payment source

## 2013-05-26 VITALS — BP 114/77 | HR 75 | Temp 97.7°F

## 2013-05-26 DIAGNOSIS — Z5189 Encounter for other specified aftercare: Secondary | ICD-10-CM

## 2013-05-26 DIAGNOSIS — C50219 Malignant neoplasm of upper-inner quadrant of unspecified female breast: Secondary | ICD-10-CM

## 2013-05-26 MED ORDER — PEGFILGRASTIM INJECTION 6 MG/0.6ML
6.0000 mg | Freq: Once | SUBCUTANEOUS | Status: AC
  Administered 2013-05-26: 6 mg via SUBCUTANEOUS

## 2013-05-28 ENCOUNTER — Telehealth: Payer: Self-pay | Admitting: Oncology

## 2013-05-28 ENCOUNTER — Telehealth: Payer: Self-pay | Admitting: *Deleted

## 2013-05-28 NOTE — Telephone Encounter (Signed)
Per staff message and POF I have scheduled appts.  JMW  

## 2013-05-29 ENCOUNTER — Other Ambulatory Visit: Payer: Self-pay | Admitting: Emergency Medicine

## 2013-06-01 ENCOUNTER — Ambulatory Visit (HOSPITAL_BASED_OUTPATIENT_CLINIC_OR_DEPARTMENT_OTHER): Payer: No Typology Code available for payment source | Admitting: Adult Health

## 2013-06-01 ENCOUNTER — Other Ambulatory Visit (HOSPITAL_BASED_OUTPATIENT_CLINIC_OR_DEPARTMENT_OTHER): Payer: No Typology Code available for payment source | Admitting: Lab

## 2013-06-01 ENCOUNTER — Other Ambulatory Visit: Payer: Self-pay

## 2013-06-01 ENCOUNTER — Encounter: Payer: Self-pay | Admitting: Adult Health

## 2013-06-01 VITALS — BP 116/82 | HR 103 | Temp 99.7°F | Resp 20 | Ht 65.5 in | Wt 225.5 lb

## 2013-06-01 DIAGNOSIS — D702 Other drug-induced agranulocytosis: Secondary | ICD-10-CM

## 2013-06-01 DIAGNOSIS — C50219 Malignant neoplasm of upper-inner quadrant of unspecified female breast: Secondary | ICD-10-CM

## 2013-06-01 DIAGNOSIS — M899 Disorder of bone, unspecified: Secondary | ICD-10-CM

## 2013-06-01 DIAGNOSIS — C50212 Malignant neoplasm of upper-inner quadrant of left female breast: Secondary | ICD-10-CM

## 2013-06-01 DIAGNOSIS — C50919 Malignant neoplasm of unspecified site of unspecified female breast: Secondary | ICD-10-CM

## 2013-06-01 DIAGNOSIS — M898X9 Other specified disorders of bone, unspecified site: Secondary | ICD-10-CM

## 2013-06-01 DIAGNOSIS — D509 Iron deficiency anemia, unspecified: Secondary | ICD-10-CM

## 2013-06-01 LAB — CBC WITH DIFFERENTIAL/PLATELET
Basophils Absolute: 0 10*3/uL (ref 0.0–0.1)
Eosinophils Absolute: 0.1 10*3/uL (ref 0.0–0.5)
HCT: 24.7 % — ABNORMAL LOW (ref 34.8–46.6)
HGB: 7.7 g/dL — ABNORMAL LOW (ref 11.6–15.9)
MCV: 71 fL — ABNORMAL LOW (ref 79.5–101.0)
MONO%: 2.5 % (ref 0.0–14.0)
NEUT#: 0.5 10*3/uL — CL (ref 1.5–6.5)
Platelets: 310 10*3/uL (ref 145–400)
RDW: 16.9 % — ABNORMAL HIGH (ref 11.2–14.5)

## 2013-06-01 LAB — COMPREHENSIVE METABOLIC PANEL (CC13)
Albumin: 2.5 g/dL — ABNORMAL LOW (ref 3.5–5.0)
CO2: 26 mEq/L (ref 22–29)
Glucose: 95 mg/dl (ref 70–99)
Sodium: 139 mEq/L (ref 136–145)
Total Bilirubin: 0.4 mg/dL (ref 0.20–1.20)
Total Protein: 7.4 g/dL (ref 6.4–8.3)

## 2013-06-01 MED ORDER — CIPROFLOXACIN HCL 500 MG PO TABS: 500.0000 mg | ORAL_TABLET | Freq: Two times a day (BID) | ORAL | Status: DC

## 2013-06-01 MED ORDER — OXYCODONE-ACETAMINOPHEN 5-325 MG PO TABS: 1.0000 | ORAL_TABLET | ORAL | Status: DC | PRN

## 2013-06-01 NOTE — Patient Instructions (Signed)
Patient Neutropenia Instruction Sheet  Diagnosis: Breast Cancer      Treating Physician: Drue Second, MD  Treatment: 1. Type of chemotherapy: Adriamycin/Cytoxan 2. Date of last treatment: 05/25/13  Last Blood Counts: Lab Results  Component Value Date   WBC 1.2* 06/01/2013   HGB 7.7* 06/01/2013   HCT 24.7* 06/01/2013   MCV 71.0* 06/01/2013   PLT 310 06/01/2013   ANC 500     Prophylactic Antibiotics: Cipro 500 mg by mouth twice a day Instructions: 1. Monitor temperature and call if fever  greater than 100.5, chills, shaking chills (rigors) 2. Call Physician on-call at [phone] 3. Give him/her symptoms and list of medications that you are taking and your last blood count.   Anemia, Frequently Asked Questions WHAT ARE THE SYMPTOMS OF ANEMIA?  Headache.  Difficulty thinking.  Fatigue.  Shortness of breath.  Weakness.  Rapid heartbeat. AT WHAT POINT ARE PEOPLE CONSIDERED ANEMIC?  This varies with gender and age.   Both hemoglobin (Hgb) and hematocrit values are used to define anemia. These lab values are obtained from a complete blood count (CBC) test. This is performed at a caregiver's office.  The normal range of hemoglobin values for adult men is 14.0 g/dL to 62.1 g/dL. For nonpregnant women, values are 12.3 g/dL to 30.8 g/dL.  The World Health Organization defines anemia as less than 12 g/dL for nonpregnant women and less than 13 g/dL for men.  For adult males, the average normal hematocrit is 46%, and the range is 40% to 52%.  For adult females, the average normal hematocrit is 41%, and the range is 35% to 47%.  Values that fall below the lower limits can be a sign of anemia and should have further checking (evaluation). GROUPS OF PEOPLE WHO ARE AT RISK FOR DEVELOPING ANEMIA INCLUDE:   Infants who are breastfed or taking a formula that is not fortified with iron.  Children going through a rapid growth spurt. The iron available can not keep up with the needs  for a red cell mass which must grow with the child.  Women in childbearing years. They need iron because of blood loss during menstruation.  Pregnant women. The growing fetus creates a high demand for iron.  People with ongoing gastrointestinal blood loss are at risk of developing iron deficiency.  Individuals with leukemia or cancer who must receive chemotherapy or radiation to treat their disease. The drugs or radiation used to treat these diseases often decreases the bone marrow's ability to make cells of all classes. This includes red blood cells, white blood cells, and platelets.  Individuals with chronic inflammatory conditions such as rheumatoid arthritis or chronic infections.  The elderly. ARE SOME TYPES OF ANEMIA INHERITED?   Yes, some types of anemia are due to inherited or genetic defects.  Sickle cell anemia. This occurs most often in people of African, African American, and Mediterranean descent.  Thalassemia (or Cooley's anemia). This type is found in people of Mediterranean and Southeast Asian descent. These types of anemia are common.  [place]. This is rare. CAN CERTAIN MEDICATIONS CAUSE A PERSON TO BECOME ANEMIC?  Yes. For example, drugs to fight cancer (chemotherapeutic agents) often cause anemia. These drugs can slow the bone marrow's ability to make red blood cells. If there are not enough red blood cells, the body does not get enough oxygen. WHAT HEMATOCRIT LEVEL IS REQUIRED TO DONATE BLOOD?  The lower limit of an acceptable hematocrit for blood donors is 38%. If you have a low  hematocrit value, you should schedule an appointment with your caregiver. ARE BLOOD TRANSFUSIONS COMMONLY USED TO CORRECT ANEMIA, AND ARE THEY DANGEROUS?  They are used to treat anemia as a last resort. Your caregiver will find the cause of the anemia and correct it if possible. Most blood transfusions are given because of excessive bleeding at the time of surgery, with trauma, or because of  bone marrow suppression in patients with cancer or leukemia on chemotherapy. Blood transfusions are safer than ever before. We also know that blood transfusions affect the immune system and may increase certain risks. There is also a concern for human error. In 1/16,000 transfusions, a patient receives a transfusion of blood that is not matched with his or her blood type.  WHAT IS IRON DEFICIENCY ANEMIA AND CAN I CORRECT IT BY CHANGING MY DIET?  Iron is an essential part of hemoglobin. Without enough hemoglobin, anemia develops and the body does not get the right amount of oxygen. Iron deficiency anemia develops after the body has had a low level of iron for a long time. This is either caused by blood loss, not taking in or absorbing enough iron, or increased demands for iron (like pregnancy or rapid growth).  Foods from animal origin such as beef, chicken, and pork, are good sources of iron. Be sure to have one of these foods at each meal. Vitamin C helps your body absorb iron. Foods rich in Vitamin C include citrus, bell pepper, strawberries, spinach and cantaloupe. In some cases, iron supplements may be needed in order to correct the iron deficiency. In the case of poor absorption, extra iron may have to be given directly into the vein through a needle (intravenously). I HAVE BEEN DIAGNOSED WITH IRON DEFICIENCY ANEMIA AND MY CAREGIVER PRESCRIBED IRON SUPPLEMENTS. HOW LONG WILL IT TAKE FOR MY BLOOD TO BECOME NORMAL?  It depends on the degree of anemia at the beginning of treatment. Most people with mild to moderate iron deficiency, anemia will correct the anemia over a period of 2 to 3 months. But after the anemia is corrected, the iron stored by the body is still low. Caregivers often suggest an additional 6 months of oral iron therapy once the anemia has been reversed. This will help prevent the iron deficiency anemia from quickly happening again. Non-anemic adult males should take iron supplements only  under the direction of a doctor, too much iron can cause liver damage.  MY HEMOGLOBIN IS 9 G/DL AND I AM SCHEDULED FOR SURGERY. SHOULD I POSTPONE THE SURGERY?  If you have Hgb of 9, you should discuss this with your caregiver right away. Many patients with similar hemoglobin levels have had surgery without problems. If minimal blood loss is expected for a minor procedure, no treatment may be necessary.  If a greater blood loss is expected for more extensive procedures, you should ask your caregiver about being treated with erythropoietin and iron. This is to accelerate the recovery of your hemoglobin to a normal level before surgery. An anemic patient who undergoes high-blood-loss surgery has a greater risk of surgical complications and need for a blood transfusion, which also carries some risk.  I HAVE BEEN TOLD THAT HEAVY MENSTRUAL PERIODS CAUSE ANEMIA. IS THERE ANYTHING I CAN DO TO PREVENT THE ANEMIA?  Anemia that results from heavy periods is usually due to iron deficiency. You can try to meet the increased demands for iron caused by the heavy monthly blood loss by increasing the intake of iron-rich foods. Iron supplements  may be required. Discuss your concerns with your caregiver. WHAT CAUSES ANEMIA DURING PREGNANCY?  Pregnancy places major demands on the body. The mother must meet the needs of both her body and her growing baby. The body needs enough iron and folate to make the right amount of red blood cells. To prevent anemia while pregnant, the mother should stay in close contact with her caregiver.  Be sure to eat a diet that has foods rich in iron and folate like liver and dark green leafy vegetables. Folate plays an important role in the normal development of a baby's spinal cord. Folate can help prevent serious disorders like spina bifida. If your diet does not provide adequate nutrients, you may want to talk with your caregiver about nutritional supplements.  WHAT IS THE RELATIONSHIP BETWEEN  FIBROID TUMORS AND ANEMIA IN WOMEN?  The relationship is usually caused by the increased menstrual blood loss caused by fibroids. Good iron intake may be required to prevent iron deficiency anemia from developing.  Document Released: 07/07/2004 Document Revised: 02/21/2012 Document Reviewed: 12/22/2010 [facility] Patient Information 2014 [place], [place].

## 2013-06-01 NOTE — Progress Notes (Signed)
OFFICE PROGRESS NOTE  CC**  Erika Flake, MD [address] 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    PRIOR THERAPY: [age] yof otherwise healthy works in [employer], [employer] (consulting) who had left sided breast mass show up about 6 months ago. 2 months ago this began getting larger rapidly. She was seen in an urgent care where this mass was thought to be an abscess. Apparently there was an attempt to excise or drain this that was not successful and she was referred to Dr. Dwain Sarna for evaluation. She underwent punch biopsy on 04/23/13 of her skin and the mass. She was then also sent for mm/us. The biopsy shows invasive high grade carcinoma that appears to be breast. It is ER 14%, PR negative, HER-2/neu negative with a Ki-67 that approaches 100%. She has also undergone mm with at least 10 cm upper inner left breast tumor with mild overyling skin thickening. There is also a 3x10 mm cluster of calcs near there also. There is indeterminate 7 mm level 1 left axillary node, she underwent a lymph node biopsy that was positive for disease. No mm evidence of right breast malignancy.  An MRI of the breasts was done on 05/22/13 and showed a 14 cm mass in the left breast protruding from the skin and also a 2.5 cm level II axillary node.   2.  Patient evaluated and began on neoadjuvant chemotherapy.  Adriamycin and Cytoxan began on 05/25/13.    CURRENT THERAPY: Cycle 1 day 8 of dose dense A/C  INTERVAL HISTORY: Erika [name] 42 y.o. female returns for f/u after receiving her first cycle of Adriamycin/Cytoxan with neulasta support.  She has had mild nausea relieved with her anti-emetics, and bone pain related to her neulasta.  The bone pain is the most distressing factor to her.  She has been taking Tylenol and Advil with no relief.  She denies fevers, chills, vomiting, constipation, diarrhea, or any further concerns.  A 10 point ROS is  otherwise negative.     MEDICAL HISTORY: Past Medical History  Diagnosis Date  . Breast cancer   . Headache(784.0)     Hx: of Migraines    ALLERGIES:  has No Known Allergies.  MEDICATIONS:  Current Outpatient Prescriptions  Medication Sig Dispense Refill  . acetaminophen (TYLENOL) 500 MG tablet Take 1,000 mg by mouth every 6 (six) hours as needed for pain.      Marland Kitchen dexamethasone (DECADRON) 4 MG tablet Take 2 daily starting day after chemo x 3 days.  30 tablet  2  . ibuprofen (ADVIL,MOTRIN) 200 MG tablet Take 200 mg by mouth every 6 (six) hours as needed for pain (600-800 mg prn left breast pain).      Marland Kitchen lidocaine-prilocaine (EMLA) cream Apply topically as needed. Apply to port  1-2 hours before procedure.  30 g  2  . LORazepam (ATIVAN) 0.5 MG tablet Take 1 tablet (0.5 mg total) by mouth every 8 (eight) hours.  30 tablet  1  . ondansetron (ZOFRAN) 8 MG tablet 1 po q12 hours as needed starting 72 hours after chemo.  30 tablet  2  . prochlorperazine (COMPAZINE) 10 MG tablet Take 1 tablet (10 mg total) by mouth every 6 (six) hours as needed.  30 tablet  2  . oxyCODONE-acetaminophen (ROXICET) 5-325 MG per tablet Take 1 tablet by mouth every 4 (four) hours as needed for pain.  20 tablet  0  No current facility-administered medications for this visit.    SURGICAL HISTORY:  Past Surgical History  Procedure Laterality Date  . Tubal ligation    . Portacath placement Right 05/22/2013    Procedure: INSERTION PORT-A-CATH;  Surgeon: Emelia Loron, MD;  Location: [facility] OR;  Service: General;  Laterality: Right;    REVIEW OF SYSTEMS:  General: fatigue (+), night sweats (-), fever (-), pain (+) Lymph: palpable nodes (-) HEENT: vision changes (-), mucositis (-), gum bleeding (-), epistaxis (-) Cardiovascular: chest pain (-), palpitations (-) Pulmonary: shortness of breath (-), dyspnea on exertion (-), cough (-), hemoptysis (-) GI:  Early satiety (-), melena (-), dysphagia (-), nausea/vomiting  (-), diarrhea (-) GU: dysuria (-), hematuria (-), incontinence (-) Musculoskeletal: joint swelling (-), joint pain (-), back pain (-) Neuro: weakness (-), numbness (-), headache (-), confusion (-) Skin: Rash (-), lesions (-), dryness (-) Psych: depression (-), suicidal/homicidal ideation (-), feeling of hopelessness (-)    PHYSICAL EXAMINATION: Blood pressure 116/82, pulse 103, temperature 99.7 F (37.6 C), temperature source Oral, resp. rate 20, height 5' 5.5" (1.664 m), weight 225 lb 8 oz (102.286 kg), last menstrual period 04/27/2013. Body mass index is 36.94 kg/(m^2). General: Patient is a well appearing female in no acute distress HEENT: PERRLA, sclerae anicteric no conjunctival pallor, MMM Neck: supple, no palpable adenopathy Lungs: clear to auscultation bilaterally, no wheezes, rhonchi, or rales Cardiovascular: regular rate rhythm, S1, S2, no murmurs, rubs or gallops Abdomen: Soft, non-tender, non-distended, normoactive bowel sounds, no HSM Extremities: warm and well perfused, no clubbing, cyanosis, or edema Skin: No rashes or lesions Neuro: Non-focal Breasts: left breast with large mass protruding from the skin, about 5-7cm, covered with a bandage/abd pad ECOG PERFORMANCE STATUS: 1 - Symptomatic but completely ambulatory  LABORATORY DATA: Lab Results  Component Value Date   WBC 1.2* 06/01/2013   HGB 7.7* 06/01/2013   HCT 24.7* 06/01/2013   MCV 71.0* 06/01/2013   PLT 310 06/01/2013      Chemistry      Component Value Date/Time   NA 140 05/25/2013 1223   NA 139 05/18/2013 1205   K 3.5 05/25/2013 1223   K 3.8 05/18/2013 1205   CL 105 05/25/2013 1223   CL 103 05/18/2013 1205   CO2 26 05/25/2013 1223   CO2 28 05/18/2013 1205   BUN 9.6 05/25/2013 1223   BUN 8 05/18/2013 1205   CREATININE 0.8 05/25/2013 1223   CREATININE 0.84 05/18/2013 1205      Component Value Date/Time   CALCIUM 9.5 05/25/2013 1223   CALCIUM 9.1 05/18/2013 1205   ALKPHOS 101 05/25/2013 1223   ALKPHOS 111 05/18/2013 1205    AST 14 05/25/2013 1223   AST 18 05/18/2013 1205   ALT 14 05/25/2013 1223   ALT 16 05/18/2013 1205   BILITOT <0.20 Repeated and Verified 05/25/2013 1223   BILITOT 0.2* 05/18/2013 1205       RADIOGRAPHIC STUDIES:  Chest 2 View  05/18/2013   *RADIOLOGY REPORT*  Clinical Data: Breast carcinoma  CHEST - 2 VIEW  Comparison: None.  Findings: Probable left breast mass. Lungs clear.  Heart size and pulmonary vascularity normal.  No effusion.  Visualized bones unremarkable.  IMPRESSION: No acute disease   Original Report Authenticated By: D. Andria Rhein, MD   US Breast Left  05/08/2013   *RADIOLOGY REPORT*  Clinical Data:  42 year old female with large left breast mass - recent punch biopsy demonstrating high-grade carcinoma.  DIGITAL DIAGNOSTIC BILATERAL MAMMOGRAM WITH CAD AND LEFT BREAST  ULTRASOUND:  Comparison:  None.  Findings:  ACR Breast Density Category 2: There is a scattered fibroglandular pattern.  A large irregular mass within the upper inner posterior left breast is identified - measuring at least 10 cm. Mild overlying skin thickening is identified compatible with dermal invasion. A 3 x 10 mm cluster of slightly heterogeneous left breast calcifications approximate 4 cm anterior and lateral to this mass is identified.  There is no evidence of suspicious mass, distortion or worrisome calcifications within the right breast. Mammographic images were processed with CAD.  On physical exam, a very large protuberant mass within the upper inner left breast is identified.  Ultrasound is performed, showing a large heterogeneous mass within the upper inner left breast at the 10-11 o'clock position, measuring at least 6.5 cm, but evaluation is limited by transducer skin area and protuberant portion of the mass. A 7 mm level I left axillary lymph node is identified with slightly echogenic rim - suspicious.  IMPRESSION: Large biopsy-proven carcinoma within the upper inner left breast, measuring at least 10 cm  mammographically. Mild overlying skin thickening compatible with dermal invasion.  3 x 10 mm cluster of indeterminate left breast calcifications 4 cm anterior and lateral to the biopsy-proven neoplasm.  This may represent DCIS and consider tissue sampling.  Indeterminate 7 mm level I left axillary lymph node - metastatic disease not excluded.  No mammographic evidence of right breast malignancy.  BI-RADS CATEGORY 6:  Known biopsy-proven malignancy - appropriate action should be taken.  RECOMMENDATION: Treatment plan.  Consider biopsies of the indeterminate left axillary lymph node and left breast calcifications as clinically indicated.  Consider bilateral breast MRI as clinically indicated.  I have discussed the findings and recommendations with the patient. Results were also provided in writing at the conclusion of the visit.   Original Report Authenticated By: Harmon Pier, M.D.   Mr Breast Bilateral W Wo Contrast  05/22/2013   *RADIOLOGY REPORT*  Clinical Data: large invasive carcinoma left upper inner quadrant with dermal involvement  BILATERAL BREAST MRI WITH AND WITHOUT CONTRAST  Technique: Multiplanar, multisequence MR images of both breasts were obtained prior to and following the intravenous administration of 20ml of Multihance.  Three dimensional images were evaluated at the independent DynaCad workstation.  Comparison:  05/08/13  Findings: There is moderate background parenchymal enhancement. The right breast and axilla are negative.  On the left, there is a large irregular mass with irregular borders in the upper inner quadrant, demonstrating heterogenous internal enhancement.  The mass measures 14 (AP x 7.5 (lateral) x 8 (craniocaudal)cm.  It causes protrusion of the skin of the breast in the 10 - 11 o'clock position, and it is associated with edematous change in the skin of the left breast, primarily in the upper inner quadrant but also in the periareolar in more infra- areolar left breast.  The mass  extends through pectoralis major musculature posteriorly, with enhancing mass extending all the way to the posterior edge of the muscle.  In the 2 o'clock position posteriorly there is a 9mm oval enhancing mass with a mixture of plateau and washout kinetics; it is an indeterminate finding but shows the suggestion of a fatty hilum, and it may be an intramammary lymph node.  In the left axilla, there is an enlarged level II lymph node demonstrating hypervascularity and cortical thickening.  It measures 2.5cm in greatest dimension, and the appearance is concerning for metastasis.  There are no other abnormalities on the left.  IMPRESSION: Large  left breast malignancy associated with involvement of the dermis and the chest wall, as well as with probable axillary lymph node metastasis.  There is an indeterminate 9mm mass in the 2 o'clock position, that may be an intramammary lymph node.  BI-RADS CATEGORY 6:  Known biopsy-proven malignancy - appropriate action should be taken.  THREE-DIMENSIONAL MR IMAGE RENDERING ON INDEPENDENT WORKSTATION:  Three-dimensional MR images were rendered by post-processing of the original MR data on an independent workstation.  The three- dimensional MR images were interpreted, and findings were reported in the accompanying complete MRI report for this study.   Original Report Authenticated By: Esperanza Heir, M.D.   Dg Chest Port 1 View  05/22/2013   *RADIOLOGY REPORT*  Clinical Data: Port-A-Cath placement.  PORTABLE CHEST - 1 VIEW  Comparison: 05/18/2013  Findings: Right-sided power injectable Port-A-Cath noted with distal tip projecting over the SVC.  Low lung volumes are present, causing crowding of the pulmonary vasculature.  No pneumothorax noted.  Bilateral midlung subsegmental atelectasis noted.  Borderline cardiomegaly.  IMPRESSION:  1.  Power injectable catheter tip:  SVC.  No pneumothorax.   Original Report Authenticated By: Gaylyn Rong, M.D.   Mm Digital Diagnostic  Bilat  05/08/2013   *RADIOLOGY REPORT*  Clinical Data:  42 year old female with large left breast mass - recent punch biopsy demonstrating high-grade carcinoma.  DIGITAL DIAGNOSTIC BILATERAL MAMMOGRAM WITH CAD AND LEFT BREAST ULTRASOUND:  Comparison:  None.  Findings:  ACR Breast Density Category 2: There is a scattered fibroglandular pattern.  A large irregular mass within the upper inner posterior left breast is identified - measuring at least 10 cm. Mild overlying skin thickening is identified compatible with dermal invasion. A 3 x 10 mm cluster of slightly heterogeneous left breast calcifications approximate 4 cm anterior and lateral to this mass is identified.  There is no evidence of suspicious mass, distortion or worrisome calcifications within the right breast. Mammographic images were processed with CAD.  On physical exam, a very large protuberant mass within the upper inner left breast is identified.  Ultrasound is performed, showing a large heterogeneous mass within the upper inner left breast at the 10-11 o'clock position, measuring at least 6.5 cm, but evaluation is limited by transducer skin area and protuberant portion of the mass. A 7 mm level I left axillary lymph node is identified with slightly echogenic rim - suspicious.  IMPRESSION: Large biopsy-proven carcinoma within the upper inner left breast, measuring at least 10 cm mammographically. Mild overlying skin thickening compatible with dermal invasion.  3 x 10 mm cluster of indeterminate left breast calcifications 4 cm anterior and lateral to the biopsy-proven neoplasm.  This may represent DCIS and consider tissue sampling.  Indeterminate 7 mm level I left axillary lymph node - metastatic disease not excluded.  No mammographic evidence of right breast malignancy.  BI-RADS CATEGORY 6:  Known biopsy-proven malignancy - appropriate action should be taken.  RECOMMENDATION: Treatment plan.  Consider biopsies of the indeterminate left axillary lymph  node and left breast calcifications as clinically indicated.  Consider bilateral breast MRI as clinically indicated.  I have discussed the findings and recommendations with the patient. Results were also provided in writing at the conclusion of the visit.   Original Report Authenticated By: Harmon Pier, M.D.   Dg Fluoro Guide Cv Line-no Report  05/22/2013   CLINICAL DATA: surgery   FLOURO GUIDE CV LINE  Fluoroscopy was utilized by the requesting physician.  No radiographic  interpretation.    [place] Lt Breast  Bx W Loc Dev 1st Lesion Img Bx Spec US Guide  05/22/2013   **ADDENDUM** CREATED: 05/22/2013 11:12:58  The patient was called by myself, Dr. Micheline Maze today.  The patient states she is doing well since her left axillary node biopsy without problems at the biopsy site.  The patient was given the results of her biopsy which was compatible with metastatic invasive mammary carcinoma.  This is concordant with imaging findings and patient's known history of recent positive biopsy of a large inner upper left breast mass demonstrating invasive mammary carcinoma. The patient had her breast MRI this morning and will receive Port-A- Cath placement by Dr. Dwain Sarna this afternoon.  Recommend follow- up pending MRI results and per treatment plan.  **END ADDENDUM** SIGNED BY: Melton Alar. Micheline Maze, M.D.  05/21/2013   *RADIOLOGY REPORT*  Clinical Data:  New diagnosis of invasive mammary carcinoma in the left upper inner quadrant.  Abnormal lymph node noted on ultrasound dated 05/08/2013.  ULTRASOUND GUIDED CORE BIOPSY OF THE LEFT AXILLA  The patient and I discussed the procedure of ultrasound-guided biopsy, including benefits and alternatives.  We discussed the high likelihood of a successful procedure. We discussed the risks of the procedure, including infection, bleeding, tissue injury, clip migration, and inadequate sampling.  Written informed consent was given. Appropriate time-out was performed.  Using sterile technique, 2%  lidocaine, ultrasound guidance, and a 14 gauge automated biopsy device, biopsy was performed of the abnormal left axillary lymph node using a lateromedial approach. At the conclusion of the procedure, a dumbbell shaped tissue marker clip was deployed into the biopsy cavity. Post biopsy mammogram was not performed.  IMPRESSION: Ultrasound guided biopsy of an abnormal left axillary lymph node. No apparent complications.  Original Report Authenticated By: Cain Saupe, M.D.    ASSESSMENT:   1. Patient is a 42 year old female with high grade invasive breast carcinoma ER 14%, PR negative, HER-2/neu negative.  She was evaluated by Dr. Dwain Sarna.  She will undergo neoadjuvant chemotherapy with dose dense Adriamycin/Cytoxan x 4 cycles followed by weekly Taxol/Carbo x 12 weeks.  She has underwent port placement and chemotherapy class.     PLAN:  1. Doing well after chemotherapy.  She denies fevers/chills.  She is subsequently neutropenic and I reviewed her labs with her in detail.  I gave the patient neutropenic precautions and reviewed them in detail.  The patient will start Cipro tonight as prophylaxis and take for one week.  I sent this to her pharmacy.  I also gave her #20 percocet tablets for her bone pain.  She was also given one percocet in office due to her reported pain of 7/10.    2. Patient will return next week for cycle two of chemotherapy.     All questions were answered. The patient knows to call the clinic with any problems, questions or concerns. We can certainly see the patient much sooner if necessary.  I spent 25 minutes counseling the patient face to face. The total time spent in the appointment was 30 minutes.   Cherie Ouch Lyn Hollingshead, NP Medical Oncology [facility] Phone: [phone]   06/01/2013, 3:36 PM

## 2013-06-04 NOTE — Addendum Note (Signed)
Addended by: Augustin Schooling C on: 06/04/2013 09:43 AM   Modules accepted: Orders

## 2013-06-05 ENCOUNTER — Telehealth: Payer: Self-pay

## 2013-06-05 NOTE — Telephone Encounter (Signed)
Spoke with pt regarding appt for CT/PET scans. Schedulers will be calling to advise pt of date and time. Pt voiced understanding and knows to call the office with any questions. TMB

## 2013-06-08 ENCOUNTER — Ambulatory Visit (HOSPITAL_BASED_OUTPATIENT_CLINIC_OR_DEPARTMENT_OTHER): Payer: No Typology Code available for payment source

## 2013-06-08 ENCOUNTER — Ambulatory Visit (HOSPITAL_BASED_OUTPATIENT_CLINIC_OR_DEPARTMENT_OTHER): Payer: No Typology Code available for payment source | Admitting: Adult Health

## 2013-06-08 ENCOUNTER — Other Ambulatory Visit (HOSPITAL_BASED_OUTPATIENT_CLINIC_OR_DEPARTMENT_OTHER): Payer: No Typology Code available for payment source | Admitting: Lab

## 2013-06-08 ENCOUNTER — Encounter: Payer: Self-pay | Admitting: Adult Health

## 2013-06-08 VITALS — BP 113/78 | HR 98 | Temp 98.8°F | Resp 20 | Ht 65.5 in | Wt 223.1 lb

## 2013-06-08 DIAGNOSIS — D509 Iron deficiency anemia, unspecified: Secondary | ICD-10-CM

## 2013-06-08 DIAGNOSIS — M898X9 Other specified disorders of bone, unspecified site: Secondary | ICD-10-CM

## 2013-06-08 DIAGNOSIS — D649 Anemia, unspecified: Secondary | ICD-10-CM

## 2013-06-08 DIAGNOSIS — C50219 Malignant neoplasm of upper-inner quadrant of unspecified female breast: Secondary | ICD-10-CM

## 2013-06-08 DIAGNOSIS — C50919 Malignant neoplasm of unspecified site of unspecified female breast: Secondary | ICD-10-CM

## 2013-06-08 DIAGNOSIS — N644 Mastodynia: Secondary | ICD-10-CM

## 2013-06-08 DIAGNOSIS — Z5111 Encounter for antineoplastic chemotherapy: Secondary | ICD-10-CM

## 2013-06-08 DIAGNOSIS — C50212 Malignant neoplasm of upper-inner quadrant of left female breast: Secondary | ICD-10-CM

## 2013-06-08 LAB — FERRITIN: Ferritin: 197 ng/mL (ref 10–291)

## 2013-06-08 LAB — CBC WITH DIFFERENTIAL/PLATELET
Eosinophils Absolute: 0 10*3/uL (ref 0.0–0.5)
MONO#: 0.5 10*3/uL (ref 0.1–0.9)
NEUT#: 6.9 10*3/uL — ABNORMAL HIGH (ref 1.5–6.5)
RBC: 3.47 10*6/uL — ABNORMAL LOW (ref 3.70–5.45)
RDW: 18.3 % — ABNORMAL HIGH (ref 11.2–14.5)
WBC: 8.7 10*3/uL (ref 3.9–10.3)
lymph#: 1.3 10*3/uL (ref 0.9–3.3)

## 2013-06-08 LAB — IRON AND TIBC
%SAT: 6 % — ABNORMAL LOW (ref 20–55)
Iron: 14 ug/dL — ABNORMAL LOW (ref 42–145)
UIBC: 215 ug/dL (ref 125–400)

## 2013-06-08 LAB — COMPREHENSIVE METABOLIC PANEL (CC13)
ALT: 15 U/L (ref 0–55)
AST: 16 U/L (ref 5–34)
Creatinine: 0.8 mg/dL (ref 0.6–1.1)
Total Bilirubin: <0.2 mg/dL (ref 0.20–1.20)

## 2013-06-08 MED ORDER — PALONOSETRON HCL INJECTION 0.25 MG/5ML
0.2500 mg | Freq: Once | INTRAVENOUS | Status: AC
  Administered 2013-06-08: 0.25 mg via INTRAVENOUS

## 2013-06-08 MED ORDER — LORAZEPAM 2 MG/ML IJ SOLN
0.5000 mg | Freq: Once | INTRAMUSCULAR | Status: AC
  Administered 2013-06-08: 0.5 mg via INTRAVENOUS

## 2013-06-08 MED ORDER — SODIUM CHLORIDE 0.9 % IV SOLN
600.0000 mg/m2 | Freq: Once | INTRAVENOUS | Status: AC
  Administered 2013-06-08: 1320 mg via INTRAVENOUS
  Filled 2013-06-08: qty 66

## 2013-06-08 MED ORDER — HEPARIN SOD (PORK) LOCK FLUSH 100 UNIT/ML IV SOLN
500.0000 [IU] | Freq: Once | INTRAVENOUS | Status: AC | PRN
  Administered 2013-06-08: 500 [IU]
  Filled 2013-06-08: qty 5

## 2013-06-08 MED ORDER — SODIUM CHLORIDE 0.9 % IV SOLN
Freq: Once | INTRAVENOUS | Status: AC
  Administered 2013-06-08: 16:00:00 via INTRAVENOUS

## 2013-06-08 MED ORDER — SODIUM CHLORIDE 0.9 % IV SOLN
150.0000 mg | Freq: Once | INTRAVENOUS | Status: AC
  Administered 2013-06-08: 150 mg via INTRAVENOUS
  Filled 2013-06-08: qty 5

## 2013-06-08 MED ORDER — DEXAMETHASONE SODIUM PHOSPHATE 20 MG/5ML IJ SOLN
12.0000 mg | Freq: Once | INTRAMUSCULAR | Status: AC
  Administered 2013-06-08: 12 mg via INTRAVENOUS

## 2013-06-08 MED ORDER — SODIUM CHLORIDE 0.9 % IJ SOLN
10.0000 mL | INTRAMUSCULAR | Status: DC | PRN
  Administered 2013-06-08: 10 mL
  Filled 2013-06-08: qty 10

## 2013-06-08 MED ORDER — OXYCODONE-ACETAMINOPHEN 5-325 MG PO TABS: 1.0000 | ORAL_TABLET | ORAL | Status: DC | PRN

## 2013-06-08 MED ORDER — DOXORUBICIN HCL CHEMO IV INJECTION 2 MG/ML
60.0000 mg/m2 | Freq: Once | INTRAVENOUS | Status: AC
  Administered 2013-06-08: 132 mg via INTRAVENOUS
  Filled 2013-06-08: qty 66

## 2013-06-08 NOTE — Progress Notes (Signed)
OFFICE PROGRESS NOTE  CC**  Erika Flake, MD [address] 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    PRIOR THERAPY: [age] yof otherwise healthy works in [employer], [employer] (consulting) who had left sided breast mass show up about 6 months ago. 2 months ago this began getting larger rapidly. She was seen in an urgent care where this mass was thought to be an abscess. Apparently there was an attempt to excise or drain this that was not successful and she was referred to Dr. Dwain Sarna for evaluation. She underwent punch biopsy on 04/23/13 of her skin and the mass. She was then also sent for mm/us. The biopsy shows invasive high grade carcinoma that appears to be breast. It is ER 14%, PR negative, HER-2/neu negative with a Ki-67 that approaches 100%. She has also undergone mm with at least 10 cm upper inner left breast tumor with mild overyling skin thickening. There is also a 3x10 mm cluster of calcs near there also. There is indeterminate 7 mm level 1 left axillary node, she underwent a lymph node biopsy that was positive for disease. No mm evidence of right breast malignancy.  An MRI of the breasts was done on 05/22/13 and showed a 14 cm mass in the left breast protruding from the skin and also a 2.5 cm level II axillary node.   2.  Patient evaluated and began on neoadjuvant chemotherapy.  Adriamycin and Cytoxan began on 05/25/13.    CURRENT THERAPY: Cycle 2 day 1 of dose dense A/C  INTERVAL HISTORY: Erika [name] 42 y.o. female returns for f/u today.  She continues to have pain in the left breast that keeps her up at night.  She has PET/CT scan scheduled next week for evaluation for metastatic disease.  She is again anemic, but denies dyspnea on exertion, fatigue, palpitations, dizziness, or any other concerns.  Otherwise a 10 point ROs is negative.    MEDICAL HISTORY: Past Medical History  Diagnosis Date  .  Breast cancer   . Headache(784.0)     Hx: of Migraines    ALLERGIES:  has No Known Allergies.  MEDICATIONS:  Current Outpatient Prescriptions  Medication Sig Dispense Refill  . acetaminophen (TYLENOL) 500 MG tablet Take 1,000 mg by mouth every 6 (six) hours as needed for pain.      . ciprofloxacin (CIPRO) 500 MG tablet Take 1 tablet (500 mg total) by mouth 2 (two) times daily.  14 tablet  0  . dexamethasone (DECADRON) 4 MG tablet Take 2 daily starting day after chemo x 3 days.  30 tablet  2  . ibuprofen (ADVIL,MOTRIN) 200 MG tablet Take 200 mg by mouth every 6 (six) hours as needed for pain (600-800 mg prn left breast pain).      Marland Kitchen lidocaine-prilocaine (EMLA) cream Apply topically as needed. Apply to port  1-2 hours before procedure.  30 g  2  . LORazepam (ATIVAN) 0.5 MG tablet Take 1 tablet (0.5 mg total) by mouth every 8 (eight) hours.  30 tablet  1  . ondansetron (ZOFRAN) 8 MG tablet 1 po q12 hours as needed starting 72 hours after chemo.  30 tablet  2  . oxyCODONE-acetaminophen (ROXICET) 5-325 MG per tablet Take 1 tablet by mouth every 4 (four) hours as needed for pain.  20 tablet  0  . prochlorperazine (COMPAZINE) 10 MG tablet Take 1 tablet (10 mg total) by mouth every 6 (  six) hours as needed.  30 tablet  2   No current facility-administered medications for this visit.    SURGICAL HISTORY:  Past Surgical History  Procedure Laterality Date  . Tubal ligation    . Portacath placement Right 05/22/2013    Procedure: INSERTION PORT-A-CATH;  Surgeon: Emelia Loron, MD;  Location: [facility] OR;  Service: General;  Laterality: Right;    REVIEW OF SYSTEMS:  General: fatigue (+), night sweats (-), fever (-), pain (+) Lymph: palpable nodes (-) HEENT: vision changes (-), mucositis (-), gum bleeding (-), epistaxis (-) Cardiovascular: chest pain (-), palpitations (-) Pulmonary: shortness of breath (-), dyspnea on exertion (-), cough (-), hemoptysis (-) GI:  Early satiety (-), melena (-),  dysphagia (-), nausea/vomiting (-), diarrhea (-) GU: dysuria (-), hematuria (-), incontinence (-) Musculoskeletal: joint swelling (-), joint pain (-), back pain (-) Neuro: weakness (-), numbness (-), headache (-), confusion (-) Skin: Rash (-), lesions (-), dryness (-) Psych: depression (-), suicidal/homicidal ideation (-), feeling of hopelessness (-)    PHYSICAL EXAMINATION: Blood pressure 113/78, pulse 98, temperature 98.8 F (37.1 C), temperature source Oral, resp. rate 20, height 5' 5.5" (1.664 m), weight 223 lb 1.6 oz (101.197 kg), last menstrual period 04/27/2013. Body mass index is 36.55 kg/(m^2). General: Patient is a well appearing female in no acute distress HEENT: PERRLA, sclerae anicteric no conjunctival pallor, MMM Neck: supple, no palpable adenopathy Lungs: clear to auscultation bilaterally, no wheezes, rhonchi, or rales Cardiovascular: regular rate rhythm, S1, S2, no murmurs, rubs or gallops Abdomen: Soft, non-tender, non-distended, normoactive bowel sounds, no HSM Extremities: warm and well perfused, no clubbing, cyanosis, or edema Skin: No rashes or lesions Neuro: Non-focal Breasts: left breast with large mass protruding from the skin, about 5-7cm, covered with a bandage/abd pad ECOG PERFORMANCE STATUS: 1 - Symptomatic but completely ambulatory  LABORATORY DATA: Lab Results  Component Value Date   WBC 8.7 06/08/2013   HGB 7.7* 06/08/2013   HCT 24.1* 06/08/2013   MCV 69.4* 06/08/2013   PLT 528* 06/08/2013      Chemistry      Component Value Date/Time   NA 140 06/08/2013 1406   NA 139 05/18/2013 1205   K 3.9 06/08/2013 1406   K 3.8 05/18/2013 1205   CL 102 06/01/2013 1500   CL 103 05/18/2013 1205   CO2 28 06/08/2013 1406   CO2 28 05/18/2013 1205   BUN 5.6* 06/08/2013 1406   BUN 8 05/18/2013 1205   CREATININE 0.8 06/08/2013 1406   CREATININE 0.84 05/18/2013 1205      Component Value Date/Time   CALCIUM 9.3 06/08/2013 1406   CALCIUM 9.1 05/18/2013 1205   ALKPHOS 113 06/08/2013  1406   ALKPHOS 111 05/18/2013 1205   AST 16 06/08/2013 1406   AST 18 05/18/2013 1205   ALT 15 06/08/2013 1406   ALT 16 05/18/2013 1205   BILITOT <0.20 Repeated and Verified 06/08/2013 1406   BILITOT 0.2* 05/18/2013 1205       RADIOGRAPHIC STUDIES:  Chest 2 View  05/18/2013   *RADIOLOGY REPORT*  Clinical Data: Breast carcinoma  CHEST - 2 VIEW  Comparison: None.  Findings: Probable left breast mass. Lungs clear.  Heart size and pulmonary vascularity normal.  No effusion.  Visualized bones unremarkable.  IMPRESSION: No acute disease   Original Report Authenticated By: D. Andria Rhein, MD   US Breast Left  05/08/2013   *RADIOLOGY REPORT*  Clinical Data:  42 year old female with large left breast mass - recent punch biopsy demonstrating high-grade  carcinoma.  DIGITAL DIAGNOSTIC BILATERAL MAMMOGRAM WITH CAD AND LEFT BREAST ULTRASOUND:  Comparison:  None.  Findings:  ACR Breast Density Category 2: There is a scattered fibroglandular pattern.  A large irregular mass within the upper inner posterior left breast is identified - measuring at least 10 cm. Mild overlying skin thickening is identified compatible with dermal invasion. A 3 x 10 mm cluster of slightly heterogeneous left breast calcifications approximate 4 cm anterior and lateral to this mass is identified.  There is no evidence of suspicious mass, distortion or worrisome calcifications within the right breast. Mammographic images were processed with CAD.  On physical exam, a very large protuberant mass within the upper inner left breast is identified.  Ultrasound is performed, showing a large heterogeneous mass within the upper inner left breast at the 10-11 o'clock position, measuring at least 6.5 cm, but evaluation is limited by transducer skin area and protuberant portion of the mass. A 7 mm level I left axillary lymph node is identified with slightly echogenic rim - suspicious.  IMPRESSION: Large biopsy-proven carcinoma within the upper inner left breast,  measuring at least 10 cm mammographically. Mild overlying skin thickening compatible with dermal invasion.  3 x 10 mm cluster of indeterminate left breast calcifications 4 cm anterior and lateral to the biopsy-proven neoplasm.  This may represent DCIS and consider tissue sampling.  Indeterminate 7 mm level I left axillary lymph node - metastatic disease not excluded.  No mammographic evidence of right breast malignancy.  BI-RADS CATEGORY 6:  Known biopsy-proven malignancy - appropriate action should be taken.  RECOMMENDATION: Treatment plan.  Consider biopsies of the indeterminate left axillary lymph node and left breast calcifications as clinically indicated.  Consider bilateral breast MRI as clinically indicated.  I have discussed the findings and recommendations with the patient. Results were also provided in writing at the conclusion of the visit.   Original Report Authenticated By: Harmon Pier, M.D.   Mr Breast Bilateral W Wo Contrast  05/22/2013   *RADIOLOGY REPORT*  Clinical Data: large invasive carcinoma left upper inner quadrant with dermal involvement  BILATERAL BREAST MRI WITH AND WITHOUT CONTRAST  Technique: Multiplanar, multisequence MR images of both breasts were obtained prior to and following the intravenous administration of 20ml of Multihance.  Three dimensional images were evaluated at the independent DynaCad workstation.  Comparison:  05/08/13  Findings: There is moderate background parenchymal enhancement. The right breast and axilla are negative.  On the left, there is a large irregular mass with irregular borders in the upper inner quadrant, demonstrating heterogenous internal enhancement.  The mass measures 14 (AP x 7.5 (lateral) x 8 (craniocaudal)cm.  It causes protrusion of the skin of the breast in the 10 - 11 o'clock position, and it is associated with edematous change in the skin of the left breast, primarily in the upper inner quadrant but also in the periareolar in more infra- areolar  left breast.  The mass extends through pectoralis major musculature posteriorly, with enhancing mass extending all the way to the posterior edge of the muscle.  In the 2 o'clock position posteriorly there is a 9mm oval enhancing mass with a mixture of plateau and washout kinetics; it is an indeterminate finding but shows the suggestion of a fatty hilum, and it may be an intramammary lymph node.  In the left axilla, there is an enlarged level II lymph node demonstrating hypervascularity and cortical thickening.  It measures 2.5cm in greatest dimension, and the appearance is concerning for metastasis.  There are no other abnormalities on the left.  IMPRESSION: Large left breast malignancy associated with involvement of the dermis and the chest wall, as well as with probable axillary lymph node metastasis.  There is an indeterminate 9mm mass in the 2 o'clock position, that may be an intramammary lymph node.  BI-RADS CATEGORY 6:  Known biopsy-proven malignancy - appropriate action should be taken.  THREE-DIMENSIONAL MR IMAGE RENDERING ON INDEPENDENT WORKSTATION:  Three-dimensional MR images were rendered by post-processing of the original MR data on an independent workstation.  The three- dimensional MR images were interpreted, and findings were reported in the accompanying complete MRI report for this study.   Original Report Authenticated By: Esperanza Heir, M.D.   Dg Chest Port 1 View  05/22/2013   *RADIOLOGY REPORT*  Clinical Data: Port-A-Cath placement.  PORTABLE CHEST - 1 VIEW  Comparison: 05/18/2013  Findings: Right-sided power injectable Port-A-Cath noted with distal tip projecting over the SVC.  Low lung volumes are present, causing crowding of the pulmonary vasculature.  No pneumothorax noted.  Bilateral midlung subsegmental atelectasis noted.  Borderline cardiomegaly.  IMPRESSION:  1.  Power injectable catheter tip:  SVC.  No pneumothorax.   Original Report Authenticated By: Gaylyn Rong, M.D.   Mm  Digital Diagnostic Bilat  05/08/2013   *RADIOLOGY REPORT*  Clinical Data:  42 year old female with large left breast mass - recent punch biopsy demonstrating high-grade carcinoma.  DIGITAL DIAGNOSTIC BILATERAL MAMMOGRAM WITH CAD AND LEFT BREAST ULTRASOUND:  Comparison:  None.  Findings:  ACR Breast Density Category 2: There is a scattered fibroglandular pattern.  A large irregular mass within the upper inner posterior left breast is identified - measuring at least 10 cm. Mild overlying skin thickening is identified compatible with dermal invasion. A 3 x 10 mm cluster of slightly heterogeneous left breast calcifications approximate 4 cm anterior and lateral to this mass is identified.  There is no evidence of suspicious mass, distortion or worrisome calcifications within the right breast. Mammographic images were processed with CAD.  On physical exam, a very large protuberant mass within the upper inner left breast is identified.  Ultrasound is performed, showing a large heterogeneous mass within the upper inner left breast at the 10-11 o'clock position, measuring at least 6.5 cm, but evaluation is limited by transducer skin area and protuberant portion of the mass. A 7 mm level I left axillary lymph node is identified with slightly echogenic rim - suspicious.  IMPRESSION: Large biopsy-proven carcinoma within the upper inner left breast, measuring at least 10 cm mammographically. Mild overlying skin thickening compatible with dermal invasion.  3 x 10 mm cluster of indeterminate left breast calcifications 4 cm anterior and lateral to the biopsy-proven neoplasm.  This may represent DCIS and consider tissue sampling.  Indeterminate 7 mm level I left axillary lymph node - metastatic disease not excluded.  No mammographic evidence of right breast malignancy.  BI-RADS CATEGORY 6:  Known biopsy-proven malignancy - appropriate action should be taken.  RECOMMENDATION: Treatment plan.  Consider biopsies of the indeterminate  left axillary lymph node and left breast calcifications as clinically indicated.  Consider bilateral breast MRI as clinically indicated.  I have discussed the findings and recommendations with the patient. Results were also provided in writing at the conclusion of the visit.   Original Report Authenticated By: Harmon Pier, M.D.   Dg Fluoro Guide Cv Line-no Report  05/22/2013   CLINICAL DATA: surgery   FLOURO GUIDE CV LINE  Fluoroscopy was utilized by the requesting physician.  No radiographic  interpretation.    [place] Lt Breast Bx W Loc Dev 1st Lesion Img Bx Spec US Guide  05/22/2013   **ADDENDUM** CREATED: 05/22/2013 11:12:58  The patient was called by myself, Dr. Micheline Maze today.  The patient states she is doing well since her left axillary node biopsy without problems at the biopsy site.  The patient was given the results of her biopsy which was compatible with metastatic invasive mammary carcinoma.  This is concordant with imaging findings and patient's known history of recent positive biopsy of a large inner upper left breast mass demonstrating invasive mammary carcinoma. The patient had her breast MRI this morning and will receive Port-A- Cath placement by Dr. Dwain Sarna this afternoon.  Recommend follow- up pending MRI results and per treatment plan.  **END ADDENDUM** SIGNED BY: Melton Alar. Micheline Maze, M.D.  05/21/2013   *RADIOLOGY REPORT*  Clinical Data:  New diagnosis of invasive mammary carcinoma in the left upper inner quadrant.  Abnormal lymph node noted on ultrasound dated 05/08/2013.  ULTRASOUND GUIDED CORE BIOPSY OF THE LEFT AXILLA  The patient and I discussed the procedure of ultrasound-guided biopsy, including benefits and alternatives.  We discussed the high likelihood of a successful procedure. We discussed the risks of the procedure, including infection, bleeding, tissue injury, clip migration, and inadequate sampling.  Written informed consent was given. Appropriate time-out was performed.  Using sterile  technique, 2% lidocaine, ultrasound guidance, and a 14 gauge automated biopsy device, biopsy was performed of the abnormal left axillary lymph node using a lateromedial approach. At the conclusion of the procedure, a dumbbell shaped tissue marker clip was deployed into the biopsy cavity. Post biopsy mammogram was not performed.  IMPRESSION: Ultrasound guided biopsy of an abnormal left axillary lymph node. No apparent complications.  Original Report Authenticated By: Cain Saupe, M.D.    ASSESSMENT:   1. Patient is a 42 year old female with high grade invasive breast carcinoma ER 14%, PR negative, HER-2/neu negative.  She was evaluated by Dr. Dwain Sarna.  She will undergo neoadjuvant chemotherapy with dose dense Adriamycin/Cytoxan x 4 cycles followed by weekly Taxol/Carbo x 12 weeks.  She has underwent port placement and chemotherapy class.     PLAN:  1. Doing well.  She will proceed with chemotherapy today.  Her last ferritin and iron levels were decreased.  She will receive feraheme next week as well as Zoladex, since she does have a h/o heavy menstrual periods.    2. Patient will return next week for labs, and an evaluation for chemotoxicities.  All questions were answered. The patient knows to call the clinic with any problems, questions or concerns. We can certainly see the patient much sooner if necessary.  I spent 25 minutes counseling the patient face to face. The total time spent in the appointment was 30 minutes.   Cherie Ouch Lyn Hollingshead, NP Medical Oncology [facility] Phone: [phone] 06/08/2013, 2:55 PM

## 2013-06-08 NOTE — Patient Instructions (Addendum)
Doing well. Proceed with therapy.  We will see you after your pet CT for the IV iron.  Goserelin injection What is this medicine? GOSERELIN (GOE se rel in) is similar to a hormone found in the body. It lowers the amount of sex hormones that the body makes. Men will have lower testosterone levels and women will have lower estrogen levels while taking this medicine. In men, this medicine is used to treat prostate cancer; the injection is either given once per month or once every 12 weeks. A once per month injection (only) is used to treat women with endometriosis, dysfunctional uterine bleeding, or advanced breast cancer. This medicine may be used for other purposes; ask your health care provider or pharmacist if you have questions. What should I tell my health care provider before I take this medicine? They need to know if you have any of these conditions (some only apply to women): -diabetes -heart disease or previous heart attack -high blood pressure -high cholesterol -kidney disease -osteoporosis or low bone density -problems passing urine -spinal cord injury -stroke -tobacco smoker -an unusual or allergic reaction to goserelin, hormone therapy, other medicines, foods, dyes, or preservatives -pregnant or trying to get pregnant -breast-feeding How should I use this medicine? This medicine is for injection under the skin. It is given by a health care professional in a hospital or clinic setting. Men receive this injection once every 4 weeks or once every 12 weeks. Women will only receive the once every 4 weeks injection. Talk to your pediatrician regarding the use of this medicine in children. Special care may be needed. Overdosage: If you think you have taken too much of this medicine contact a poison control center or emergency room at once. NOTE: This medicine is only for you. Do not share this medicine with others. What if I miss a dose? It is important not to miss your dose. Call your  doctor or health care professional if you are unable to keep an appointment. What may interact with this medicine? -female hormones like estrogen -herbal or dietary supplements like black cohosh, chasteberry, or DHEA -female hormones like testosterone -prasterone This list may not describe all possible interactions. Give your health care provider a list of all the medicines, herbs, non-prescription drugs, or dietary supplements you use. Also tell them if you smoke, drink alcohol, or use illegal drugs. Some items may interact with your medicine. What should I watch for while using this medicine? Visit your doctor or health care professional for regular checks on your progress. Your symptoms may appear to get worse during the first weeks of this therapy. Tell your doctor or healthcare professional if your symptoms do not start to get better or if they get worse after this time. Your bones may get weaker if you take this medicine for a long time. If you smoke or frequently drink alcohol you may increase your risk of bone loss. A family history of osteoporosis, chronic use of drugs for seizures (convulsions), or corticosteroids can also increase your risk of bone loss. Talk to your doctor about how to keep your bones strong. This medicine should stop regular monthly menstration in women. Tell your doctor if you continue to [facility]. Women should not become pregnant while taking this medicine or for 12 weeks after stopping this medicine. Women should inform their doctor if they wish to become pregnant or think they might be pregnant. There is a potential for serious side effects to an unborn child. Talk to your  health care professional or pharmacist for more information. Do not breast-feed an infant while taking this medicine. Men should inform their doctors if they wish to father a child. This medicine may lower sperm counts. Talk to your health care professional or pharmacist for more information. What side  effects may I notice from receiving this medicine? Side effects that you should report to your doctor or health care professional as soon as possible: -allergic reactions like skin rash, itching or hives, swelling of the face, lips, or tongue -bone pain -breathing problems -changes in vision -chest pain -feeling faint or lightheaded, falls -fever, chills -pain, swelling, warmth in the leg -pain, tingling, numbness in the hands or feet -swelling of the ankles, feet, hands -trouble passing urine or change in the amount of urine -unusually high or low blood pressure -unusually weak or tired Side effects that usually do not require medical attention (report to your doctor or health care professional if they continue or are bothersome): -change in sex drive or performance -changes in breast size in both males and females -changes in emotions or moods -headache -hot flashes -irritation at site where injected -loss of appetite -skin problems like acne, dry skin -vaginal dryness This list may not describe all possible side effects. Call your doctor for medical advice about side effects. You may report side effects to FDA at 1-800-FDA-1088. Where should I keep my medicine? This drug is given in a hospital or clinic and will not be stored at home. NOTE: This sheet is a summary. It may not cover all possible information. If you have questions about this medicine, talk to your doctor, pharmacist, or health care provider.  2013, [employer]. (04/15/2009 1:28:29 PM) Ferumoxytol injection What is this medicine? FERUMOXYTOL is an iron complex. Iron is used to make healthy red blood cells, which carry oxygen and nutrients throughout the body. This medicine is used to treat iron deficiency anemia in people with chronic kidney disease. This medicine may be used for other purposes; ask your health care provider or pharmacist if you have questions. What should I tell my health care provider  before I take this medicine? They need to know if you have any of these conditions: -anemia not caused by low iron levels -high levels of iron in the blood -magnetic resonance imaging (MRI) test scheduled -an unusual or allergic reaction to iron, other medicines, foods, dyes, or preservatives -pregnant or trying to get pregnant -breast-feeding How should I use this medicine? This medicine is for infusion into a vein. It is given by a health care professional in a hospital or clinic setting. Talk to your pediatrician regarding the use of this medicine in children. Special care may be needed. Overdosage: If you think you've taken too much of this medicine contact a poison control center or emergency room at once. Overdosage: If you think you have taken too much of this medicine contact a poison control center or emergency room at once. NOTE: This medicine is only for you. Do not share this medicine with others. What if I miss a dose? It is important not to miss your dose. Call your doctor or health care professional if you are unable to keep an appointment. What may interact with this medicine? This medicine may interact with the following medications: -other iron products This list may not describe all possible interactions. Give your health care provider a list of all the medicines, herbs, non-prescription drugs, or dietary supplements you use. Also tell them if you smoke,  drink alcohol, or use illegal drugs. Some items may interact with your medicine. What should I watch for while using this medicine? Visit your doctor or healthcare professional regularly. Tell your doctor or healthcare professional if your symptoms do not start to get better or if they get worse. You may need blood work done while you are taking this medicine. You may need to follow a special diet. Talk to your doctor. Foods that contain iron include: whole grains/cereals, dried fruits, beans, or peas, leafy green vegetables,  and organ meats (liver, kidney). What side effects may I notice from receiving this medicine? Side effects that you should report to your doctor or health care professional as soon as possible: -allergic reactions like skin rash, itching or hives, swelling of the face, lips, or tongue -breathing problems -changes in blood pressure -feeling faint or lightheaded, falls -fever or chills -flushing, sweating, or hot feelings -swelling of the ankles or feet Side effects that usually do not require medical attention (Report these to your doctor or health care professional if they continue or are bothersome.): -diarrhea -headache -nausea, vomiting -stomach pain This list may not describe all possible side effects. Call your doctor for medical advice about side effects. You may report side effects to FDA at 1-800-FDA-1088. Where should I keep my medicine? This drug is given in a hospital or clinic and will not be stored at home. NOTE: This sheet is a summary. It may not cover all possible information. If you have questions about this medicine, talk to your doctor, pharmacist, or health care provider.  2013, [employer]. (08/21/2008 9:48:25 PM)

## 2013-06-08 NOTE — Patient Instructions (Addendum)
[facility] Discharge Instructions for Patients Receiving Chemotherapy  Today you received the following chemotherapy agents :  Adriamycin,  Cytoxan.  To help prevent nausea and vomiting after your treatment, we encourage you to take your nausea medication as instructed by your physician.   If you develop nausea and vomiting that is not controlled by your nausea medication, call the clinic.   BELOW ARE SYMPTOMS THAT SHOULD BE REPORTED IMMEDIATELY:  *FEVER GREATER THAN 100.5 F  *CHILLS WITH OR WITHOUT FEVER  NAUSEA AND VOMITING THAT IS NOT CONTROLLED WITH YOUR NAUSEA MEDICATION  *UNUSUAL SHORTNESS OF BREATH  *UNUSUAL BRUISING OR BLEEDING  TENDERNESS IN MOUTH AND THROAT WITH OR WITHOUT PRESENCE OF ULCERS  *URINARY PROBLEMS  *BOWEL PROBLEMS  UNUSUAL RASH Items with * indicate a potential emergency and should be followed up as soon as possible.  Feel free to call the clinic you have any questions or concerns. The clinic phone number is [phone].    

## 2013-06-09 ENCOUNTER — Ambulatory Visit (HOSPITAL_BASED_OUTPATIENT_CLINIC_OR_DEPARTMENT_OTHER): Payer: No Typology Code available for payment source

## 2013-06-09 DIAGNOSIS — Z5189 Encounter for other specified aftercare: Secondary | ICD-10-CM

## 2013-06-09 DIAGNOSIS — C50219 Malignant neoplasm of upper-inner quadrant of unspecified female breast: Secondary | ICD-10-CM

## 2013-06-09 MED ORDER — PEGFILGRASTIM INJECTION 6 MG/0.6ML
6.0000 mg | Freq: Once | SUBCUTANEOUS | Status: AC
  Administered 2013-06-09: 6 mg via SUBCUTANEOUS

## 2013-06-11 NOTE — Progress Notes (Signed)
Erika [name] [id number] 1971-09-23 41 y.o. 06/11/2013 5:00 PM  CC  [name], MD [address] [place] [address] Dr. Emelia Loron Dr. Lonie Peak  REASON FOR CONSULTATION:  42 year old female with new diagnosis of breast cancer in the upper-inner quadrant of the left breast. Patient is seen in medical oncology for discussion of neoadjuvant chemotherapy.  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 Emelia Loron  HISTORY OF PRESENT ILLNESS:  Erika [name] is a 42 y.o. female.  Without significant past medical history who noticed a small red mass in the left breast about 6 months ago. Subsequently it began to grow. She was seen by urgent care. Initially the mass was caught assuming it was an abscess. But it did not heal therefore she was referred to surgery. She underwent a biopsy on May 12. She also on 05/08/2013 had a mammogram performed that showed a large mass in the upper inner left breast measuring at least 10 cm. There was mild overlying skin thickening compatible with dermal invasion. The separate cluster of indeterminate left breast calcifications measuring 3 x 10 mm. There was also indeterminate 7 mm axillary lymph node. Patient went on to have an ultrasound on 05/08/2013 that showed a large heterogeneous mass within the upper inner left breast at the 10:00 to 11:00 position measuring at least 6.5 cm. 7 mm level I left axillary lymph node was identified suspicious for malignancy. Patient also had MRI of the breasts is scheduled for 05/22/2013. Patient is without any complaints she is accompanied by her family today. She does have tenderness and pain in the left breast. She has not noticed any masses in the right breast   Past Medical History: Past Medical History  Diagnosis Date  . Breast cancer   . Headache(784.0)     Hx:  of Migraines    Past Surgical History: Past Surgical History  Procedure Laterality Date  . Tubal ligation    . Portacath placement Right 05/22/2013    Procedure: INSERTION PORT-A-CATH;  Surgeon: Emelia Loron, MD;  Location: [facility] OR;  Service: General;  Laterality: Right;    Family History: Family History  Problem Relation Age of Onset  . Hypertension Mother   . Diabetes Father   . Cancer Maternal Aunt     stomach    Social History History  Substance Use Topics  . Smoking status: Never Smoker   . Smokeless tobacco: Never Used  . Alcohol Use: No    Allergies: No Known Allergies  Current Medications: Current Outpatient Prescriptions  Medication Sig Dispense Refill  . ibuprofen (ADVIL,MOTRIN) 200 MG tablet Take 200 mg by mouth every 6 (six) hours as needed for pain (600-800 mg prn left breast pain).      Marland Kitchen acetaminophen (TYLENOL) 500 MG tablet Take 1,000 mg by mouth every 6 (six) hours as needed for pain.      . ciprofloxacin (CIPRO) 500 MG tablet Take 1 tablet (500 mg total) by mouth 2 (two) times daily.  14 tablet  0  . dexamethasone (DECADRON) 4 MG tablet Take 2 daily starting day after chemo x 3 days.  30 tablet  2  . lidocaine-prilocaine (EMLA) cream Apply topically as needed. Apply to port  1-2 hours before procedure.  30 g  2  . LORazepam (ATIVAN) 0.5 MG tablet Take 1 tablet (0.5 mg total) by mouth every 8 (eight) hours.  30 tablet  1  . ondansetron (ZOFRAN) 8 MG tablet 1 po q12 hours as needed starting 72 hours after chemo.  30 tablet  2  . oxyCODONE-acetaminophen (ROXICET) 5-325 MG per tablet Take 1 tablet by mouth every 4 (four) hours as needed for pain.  30 tablet  0  . prochlorperazine (COMPAZINE) 10 MG tablet Take 1 tablet (10 mg total) by mouth every 6 (six) hours as needed.  30 tablet  2   No current facility-administered medications for this visit.    OB/GYN History: Menarche at age 42 she is premenopausal. She's had 3 term births first live birth was at  55.  Fertility Discussion: Patient has completed her family Prior History of Cancer: No  Health Maintenance:  Colonoscopy no Bone Density no Last PAP smear July 2011  ECOG PERFORMANCE STATUS: 0 - Asymptomatic  Genetic Counseling/testing: Patient will be referred for genetic counseling and testing  REVIEW OF SYSTEMS: Overall patient is doing well she denies any headaches double vision blurring of vision fevers chills night sweats no shortness of breath no chest pain she does have tenderness in the left breast she has no nausea or vomiting abdominal pain no weakness or fatigue no bleeding problems. Remainder of the 14 point review of systems is negative.   PHYSICAL EXAMINATION: Blood pressure 112/77, pulse 96, temperature 98.4 F (36.9 C), temperature source Oral, resp. rate 20, height 5' 5.5" (1.664 m), weight 228 lb 9.6 oz (103.692 kg), last menstrual period 04/27/2013. Well-developed well-nourished female in no acute distress HEENT exam EOMI PERRLA sclerae anicteric no conjunctival pallor oral mucosa is moist neck is supple lungs are clear cardiovascular regular rate rhythm abdomen is soft no HSM extremities no edema neuro patient's alert oriented otherwise nonfocal left breast reveals a very engorged and enlarged breast there is noted to be large rounded purplish mass in the upper inner quadrant no discharge.   STUDIES/RESULTS: Chest 2 View  05/18/2013   *RADIOLOGY REPORT*  Clinical Data: Breast carcinoma  CHEST - 2 VIEW  Comparison: None.  Findings: Probable left breast mass. Lungs clear.  Heart size and pulmonary vascularity normal.  No effusion.  Visualized bones unremarkable.  IMPRESSION: No acute disease   Original Report Authenticated By: D. Andria Rhein, MD   Mr Breast Bilateral W Wo Contrast  05/22/2013   *RADIOLOGY REPORT*  Clinical Data: large invasive carcinoma left upper inner quadrant with dermal involvement  BILATERAL BREAST MRI WITH AND WITHOUT CONTRAST  Technique:  Multiplanar, multisequence MR images of both breasts were obtained prior to and following the intravenous administration of 20ml of Multihance.  Three dimensional images were evaluated at the independent DynaCad workstation.  Comparison:  05/08/13  Findings: There is moderate background parenchymal enhancement. The right breast and axilla are negative.  On the left, there is a large irregular mass with irregular borders in the upper inner quadrant, demonstrating heterogenous internal enhancement.  The mass measures 14 (AP x 7.5 (lateral) x 8 (craniocaudal)cm.  It causes protrusion of the skin of the breast in the 10 - 11 o'clock position, and it is associated with edematous change in the skin of the left breast, primarily in the upper inner quadrant but also in the periareolar in more infra- areolar left breast.  The mass extends through pectoralis major musculature posteriorly, with enhancing mass extending all the way to the posterior edge of the muscle.  In the 2 o'clock position posteriorly there is a 9mm oval enhancing mass with a mixture of plateau and  washout kinetics; it is an indeterminate finding but shows the suggestion of a fatty hilum, and it may be an intramammary lymph node.  In the left axilla, there is an enlarged level II lymph node demonstrating hypervascularity and cortical thickening.  It measures 2.5cm in greatest dimension, and the appearance is concerning for metastasis.  There are no other abnormalities on the left.  IMPRESSION: Large left breast malignancy associated with involvement of the dermis and the chest wall, as well as with probable axillary lymph node metastasis.  There is an indeterminate 9mm mass in the 2 o'clock position, that may be an intramammary lymph node.  BI-RADS CATEGORY 6:  Known biopsy-proven malignancy - appropriate action should be taken.  THREE-DIMENSIONAL MR IMAGE RENDERING ON INDEPENDENT WORKSTATION:  Three-dimensional MR images were rendered by post-processing of  the original MR data on an independent workstation.  The three- dimensional MR images were interpreted, and findings were reported in the accompanying complete MRI report for this study.   Original Report Authenticated By: Esperanza Heir, M.D.   Dg Chest Port 1 View  05/22/2013   *RADIOLOGY REPORT*  Clinical Data: Port-A-Cath placement.  PORTABLE CHEST - 1 VIEW  Comparison: 05/18/2013  Findings: Right-sided power injectable Port-A-Cath noted with distal tip projecting over the SVC.  Low lung volumes are present, causing crowding of the pulmonary vasculature.  No pneumothorax noted.  Bilateral midlung subsegmental atelectasis noted.  Borderline cardiomegaly.  IMPRESSION:  1.  Power injectable catheter tip:  SVC.  No pneumothorax.   Original Report Authenticated By: Gaylyn Rong, M.D.   Dg Fluoro Guide Cv Line-no Report  05/22/2013   CLINICAL DATA: surgery   FLOURO GUIDE CV LINE  Fluoroscopy was utilized by the requesting physician.  No radiographic  interpretation.    [place] Lt Breast Bx W Loc Dev 1st Lesion Img Bx Spec US Guide  05/22/2013   **ADDENDUM** CREATED: 05/22/2013 11:12:58  The patient was called by myself, Dr. Micheline Maze today.  The patient states she is doing well since her left axillary node biopsy without problems at the biopsy site.  The patient was given the results of her biopsy which was compatible with metastatic invasive mammary carcinoma.  This is concordant with imaging findings and patient's known history of recent positive biopsy of a large inner upper left breast mass demonstrating invasive mammary carcinoma. The patient had her breast MRI this morning and will receive Port-A- Cath placement by Dr. Dwain Sarna this afternoon.  Recommend follow- up pending MRI results and per treatment plan.  **END ADDENDUM** SIGNED BY: Melton Alar. Micheline Maze, M.D.  05/21/2013   *RADIOLOGY REPORT*  Clinical Data:  New diagnosis of invasive mammary carcinoma in the left upper inner quadrant.  Abnormal lymph node  noted on ultrasound dated 05/08/2013.  ULTRASOUND GUIDED CORE BIOPSY OF THE LEFT AXILLA  The patient and I discussed the procedure of ultrasound-guided biopsy, including benefits and alternatives.  We discussed the high likelihood of a successful procedure. We discussed the risks of the procedure, including infection, bleeding, tissue injury, clip migration, and inadequate sampling.  Written informed consent was given. Appropriate time-out was performed.  Using sterile technique, 2% lidocaine, ultrasound guidance, and a 14 gauge automated biopsy device, biopsy was performed of the abnormal left axillary lymph node using a lateromedial approach. At the conclusion of the procedure, a dumbbell shaped tissue marker clip was deployed into the biopsy cavity. Post biopsy mammogram was not performed.  IMPRESSION: Ultrasound guided biopsy of an abnormal left axillary lymph node. No apparent  complications.  Original Report Authenticated By: Cain Saupe, M.D.     LABS:    Chemistry      Component Value Date/Time   NA 140 06/08/2013 1406   NA 139 05/18/2013 1205   K 3.9 06/08/2013 1406   K 3.8 05/18/2013 1205   CL 102 06/01/2013 1500   CL 103 05/18/2013 1205   CO2 28 06/08/2013 1406   CO2 28 05/18/2013 1205   BUN 5.6* 06/08/2013 1406   BUN 8 05/18/2013 1205   CREATININE 0.8 06/08/2013 1406   CREATININE 0.84 05/18/2013 1205      Component Value Date/Time   CALCIUM 9.3 06/08/2013 1406   CALCIUM 9.1 05/18/2013 1205   ALKPHOS 113 06/08/2013 1406   ALKPHOS 111 05/18/2013 1205   AST 16 06/08/2013 1406   AST 18 05/18/2013 1205   ALT 15 06/08/2013 1406   ALT 16 05/18/2013 1205   BILITOT <0.20 Repeated and Verified 06/08/2013 1406   BILITOT 0.2* 05/18/2013 1205      Lab Results  Component Value Date   WBC 8.7 06/08/2013   HGB 7.7* 06/08/2013   HCT 24.1* 06/08/2013   MCV 69.4* 06/08/2013   PLT 528* 06/08/2013   PATHOLOGY: ADDITIONAL INFORMATION: PROGNOSTIC INDICATORS - ACIS Results: IMMUNOHISTOCHEMICAL AND MORPHOMETRIC  ANALYSIS BY THE AUTOMATED CELLULAR IMAGING SYSTEM (ACIS) Estrogen Receptor: 14%, POSITIVE, MODERATE STAINING INTENSITY Progesterone Receptor: 0%, NEGATIVE Proliferation Marker Ki67: 100% COMMENT: The negative hormone receptor study in this case has no internal positive control. REFERENCE RANGE ESTROGEN RECEPTOR NEGATIVE <1% POSITIVE =>1% PROGESTERONE RECEPTOR NEGATIVE <1% POSITIVE =>1% All controls stained appropriately Erika Leisure MD [name], Erika [name] ( Signed 05/16/2013) CHROMOGENIC IN-SITU HYBRIDIZATION Results: HER-2/NEU BY CISH - NO AMPLIFICATION OF HER-2 DETECTED. RESULT RATIO OF HER2: CEP 17 SIGNALS 0.90 1 of 3 Duplicate copy FINAL for Erika [name], Erika [name] [phone]) ADDITIONAL INFORMATION:(continued) AVERAGE HER2 COPY NUMBER PER CELL 1.75 REFERENCE RANGE NEGATIVE HER2/Chr17 Ratio <2.0 and Average HER2 copy number <4.0 EQUIVOCAL HER2/Chr17 Ratio <2.0 and Average HER2 copy number 4.0 and <6.0 POSITIVE HER2/Chr17 Ratio >=2.0 and/or Average HER2 copy number >=6.0 Erika [name], Erika [name] ( Signed 05/14/2013) FINAL DIAGNOSIS Diagnosis Breast, biopsy, left mass - INVASIVE HIGH GRADE CARCINOMA, SEE COMMENT. Microscopic Comment The carcinoma demonstrates the follow immunophenotype: GCDFP - negative expression Estrogen Receptor - focal weak expression The history of breast mass is noted. Although the morphology and weak ER expression is consistent with primary breast, correlation with pending mammogram is required. Further immunophenotyping can be preformed if the mass is not clinically considered to represent a breast primary. The case was reviewed with Dr. Laureen [name] who concurs. The case was discussed with Dr. Antonieta [name] on 04/26/2013 (CRR:caf 04/25/13) Erika [name] [name], Erika [name] (Case signed 04/26/2013)  SDiagnosis Breast, left, needle core biopsy, axilla - ONE LYMPH NODE POSITIVE FOR METASTATIC MAMMARY CARCINOMA  (1/1). SEE COMMENT. Microscopic Comment The history of invasive mammary carcinoma is noted ([id number]). The current case was reviewed with Dr. Raynald Blend who concurs. (CRR:gt, 05/22/13) Erika [name] [name], Erika [name] (Case signed 05/22/2013) Specimen Gross and Clinicpecimen Gross and Clinical Information ASSESSMENT    42 year old female with  #1 new diagnosis of high-grade invasive ductal carcinoma of the left breast presenting with a large palpable mass in the upper inner quadrant without any nipple discharge or retraction. Patient is status post biopsy that revealed ER weakly positive PR negative HER-2/neu negative. So patient essentially has a triple-negative disease. She also by ultrasound has a suspicious lymph node likely positive for  disease. By ultrasound tumor measures over 6 cm. She likely is a T4 NX MX disease stage III.  #2 patient is a good candidate for neoadjuvant chemotherapy. We discussed Adriamycin Cytoxan dose dense x4 cycles followed by Taxol carboplatinum given weekly for a total of 12 weeks. After that she will proceed with surgery. Risks and benefits of treatment were discussed with the patient.  #3 patient will need staging studies including PET CT we will get this ordered for her. She will need MRI of the breasts for treatment planning.  #4 she will need an echocardiogram chemotherapy teaching class.  #5 she will need a Port-A-Cath placed for chemotherapy infusion. We will ask Dr. Emelia Loron to [name] this for [place].   Clinical Trial Eligibility: no  Multidisciplinary conference discussion yes     PLAN:    #1 patient will proceed with MRI of the breasts PET scan CT scan.  #2 she will have her Port-A-Cath placed by Dr. Emelia Loron.  #3 she has a hard he been seen by radiation oncology for radiation after her surgery. Patient most likely will need a mastectomy.  #4 I plan is to begin her on chemotherapy in a week's time.        Discussion: Patient is being treated per NCCN breast cancer care guidelines appropriate for stage.III   Thank you so much for allowing me to participate in the care of [facility]. I will continue to follow up the patient with you and assist in her care.  All questions were answered. The patient knows to call the clinic with any problems, questions or concerns. We can certainly see the patient much sooner if necessary.  I spent 55 minutes counseling the patient face to face. The total time spent in the appointment was 60 minutes.  Erika Second, MD Medical/Oncology [facility] [phone] (beeper) [phone] (Office)

## 2013-06-12 ENCOUNTER — Ambulatory Visit (INDEPENDENT_AMBULATORY_CARE_PROVIDER_SITE_OTHER): Payer: PRIVATE HEALTH INSURANCE | Admitting: General Surgery

## 2013-06-12 ENCOUNTER — Encounter (INDEPENDENT_AMBULATORY_CARE_PROVIDER_SITE_OTHER): Payer: Self-pay | Admitting: General Surgery

## 2013-06-12 VITALS — BP 110/68 | HR 96 | Resp 14 | Ht 65.5 in | Wt 225.2 lb

## 2013-06-12 DIAGNOSIS — C50212 Malignant neoplasm of upper-inner quadrant of left female breast: Secondary | ICD-10-CM

## 2013-06-12 DIAGNOSIS — C50219 Malignant neoplasm of upper-inner quadrant of unspecified female breast: Secondary | ICD-10-CM

## 2013-06-12 NOTE — Progress Notes (Signed)
Subjective:     Patient ID: Erika [name], female   DOB: June 19, 1971, 42 y.o.   MRN: [id number]  HPI 89 yof with locally advanced left breast cancer who still is undergoing some of systemic workup but had extended time prior to therapy.  I placed a right sided port that has gone well and she returns today without complaints.  She has gotten two cycles of chemotherapy and this area has gotten smaller.  She comes back in today to discuss options.  Review of Systems     Objective:   Physical Exam Right sided port without infection, in good position Left breast with locally advanced tumor that is already smaller    Assessment:     Locally advanced left breast cancer     Plan:     She has done well after port placement. She has tolerated her first 2 cycles of chemotherapy well. She is still undergoing some of her systemic workup right now. I discussed with her biopsy in another area that showed up on the MR. I will discuss with her medical oncologist and await her PET scan results but if all that appears negative then I will plan on sending her for biopsy of this other nodule seen on the MRI.

## 2013-06-13 ENCOUNTER — Ambulatory Visit (HOSPITAL_BASED_OUTPATIENT_CLINIC_OR_DEPARTMENT_OTHER): Payer: No Typology Code available for payment source

## 2013-06-13 ENCOUNTER — Other Ambulatory Visit: Payer: Self-pay | Admitting: Oncology

## 2013-06-13 ENCOUNTER — Other Ambulatory Visit: Payer: Self-pay | Admitting: Adult Health

## 2013-06-13 ENCOUNTER — Ambulatory Visit (HOSPITAL_COMMUNITY)
Admission: RE | Admit: 2013-06-13 | Discharge: 2013-06-13 | Disposition: A | Discharge status: Home / Self Care | Payer: No Typology Code available for payment source | Source: Ambulatory Visit | Attending: Adult Health | Admitting: Adult Health

## 2013-06-13 ENCOUNTER — Encounter (HOSPITAL_COMMUNITY): Payer: Self-pay

## 2013-06-13 ENCOUNTER — Encounter (HOSPITAL_COMMUNITY)
Admission: RE | Admit: 2013-06-13 | Discharge: 2013-06-13 | Disposition: A | Discharge status: Home / Self Care | Payer: No Typology Code available for payment source | Source: Ambulatory Visit | Attending: Adult Health | Admitting: Adult Health

## 2013-06-13 VITALS — BP 115/65 | HR 84 | Temp 98.2°F | Resp 20

## 2013-06-13 DIAGNOSIS — K8020 Calculus of gallbladder without cholecystitis without obstruction: Secondary | ICD-10-CM | POA: Insufficient documentation

## 2013-06-13 DIAGNOSIS — C50219 Malignant neoplasm of upper-inner quadrant of unspecified female breast: Secondary | ICD-10-CM

## 2013-06-13 DIAGNOSIS — C773 Secondary and unspecified malignant neoplasm of axilla and upper limb lymph nodes: Secondary | ICD-10-CM | POA: Insufficient documentation

## 2013-06-13 DIAGNOSIS — C50212 Malignant neoplasm of upper-inner quadrant of left female breast: Secondary | ICD-10-CM

## 2013-06-13 DIAGNOSIS — C50912 Malignant neoplasm of unspecified site of left female breast: Secondary | ICD-10-CM

## 2013-06-13 DIAGNOSIS — D509 Iron deficiency anemia, unspecified: Secondary | ICD-10-CM

## 2013-06-13 DIAGNOSIS — C50919 Malignant neoplasm of unspecified site of unspecified female breast: Secondary | ICD-10-CM | POA: Insufficient documentation

## 2013-06-13 DIAGNOSIS — Z5111 Encounter for antineoplastic chemotherapy: Secondary | ICD-10-CM

## 2013-06-13 MED ORDER — GOSERELIN ACETATE 3.6 MG ~~LOC~~ IMPL
3.6000 mg | DRUG_IMPLANT | SUBCUTANEOUS | Status: DC
  Administered 2013-06-13: 3.6 mg via SUBCUTANEOUS

## 2013-06-13 MED ORDER — SODIUM CHLORIDE 0.9 % IV SOLN
Freq: Once | INTRAVENOUS | Status: AC
  Administered 2013-06-13: 16:00:00 via INTRAVENOUS

## 2013-06-13 MED ORDER — IOHEXOL 300 MG/ML  SOLN
100.0000 mL | Freq: Once | INTRAMUSCULAR | Status: AC | PRN
  Administered 2013-06-13: 100 mL via INTRAVENOUS

## 2013-06-13 MED ORDER — FLUDEOXYGLUCOSE F - 18 (FDG) INJECTION
20.1000 | Freq: Once | INTRAVENOUS | Status: AC | PRN
  Administered 2013-06-13: 20.1 via INTRAVENOUS

## 2013-06-13 MED ORDER — GOSERELIN ACETATE 3.6 MG ~~LOC~~ IMPL
3.6000 mg | DRUG_IMPLANT | SUBCUTANEOUS | Status: DC
  Administered 2013-06-13: 3.6 mg via SUBCUTANEOUS
  Filled 2013-06-13: qty 3.6

## 2013-06-13 MED ORDER — SODIUM CHLORIDE 0.9 % IV SOLN
1020.0000 mg | Freq: Once | INTRAVENOUS | Status: AC
  Administered 2013-06-13: 1020 mg via INTRAVENOUS
  Filled 2013-06-13: qty 34

## 2013-06-13 NOTE — Patient Instructions (Addendum)
Ferumoxytol injection What is this medicine? FERUMOXYTOL is an iron complex. Iron is used to make healthy red blood cells, which carry oxygen and nutrients throughout the body. This medicine is used to treat iron deficiency anemia in people with chronic kidney disease. This medicine may be used for other purposes; ask your health care provider or pharmacist if you have questions. What should I tell my health care provider before I take this medicine? They need to know if you have any of these conditions: -anemia not caused by low iron levels -high levels of iron in the blood -magnetic resonance imaging (MRI) test scheduled -an unusual or allergic reaction to iron, other medicines, foods, dyes, or preservatives -pregnant or trying to get pregnant -breast-feeding How should I use this medicine? This medicine is for infusion into a vein. It is given by a health care professional in a hospital or clinic setting. Talk to your pediatrician regarding the use of this medicine in children. Special care may be needed. Overdosage: If you think you've taken too much of this medicine contact a poison control center or emergency room at once. Overdosage: If you think you have taken too much of this medicine contact a poison control center or emergency room at once. NOTE: This medicine is only for you. Do not share this medicine with others. What if I miss a dose? It is important not to miss your dose. Call your doctor or health care professional if you are unable to keep an appointment. What may interact with this medicine? This medicine may interact with the following medications: -other iron products This list may not describe all possible interactions. Give your health care provider a list of all the medicines, herbs, non-prescription drugs, or dietary supplements you use. Also tell them if you smoke, drink alcohol, or use illegal drugs. Some items may interact with your medicine. What should I watch  for while using this medicine? Visit your doctor or healthcare professional regularly. Tell your doctor or healthcare professional if your symptoms do not start to get better or if they get worse. You may need blood work done while you are taking this medicine. You may need to follow a special diet. Talk to your doctor. Foods that contain iron include: whole grains/cereals, dried fruits, beans, or peas, leafy green vegetables, and organ meats (liver, kidney). What side effects may I notice from receiving this medicine? Side effects that you should report to your doctor or health care professional as soon as possible: -allergic reactions like skin rash, itching or hives, swelling of the face, lips, or tongue -breathing problems -changes in blood pressure -feeling faint or lightheaded, falls -fever or chills -flushing, sweating, or hot feelings -swelling of the ankles or feet Side effects that usually do not require medical attention (Report these to your doctor or health care professional if they continue or are bothersome.): -diarrhea -headache -nausea, vomiting -stomach pain This list may not describe all possible side effects. Call your doctor for medical advice about side effects. You may report side effects to FDA at 1-800-FDA-1088. Where should I keep my medicine? This drug is given in a hospital or clinic and will not be stored at home. NOTE: This sheet is a summary. It may not cover all possible information. If you have questions about this medicine, talk to your doctor, pharmacist, or health care provider.  2013, [employer]. (08/21/2008 9:48:25 PM) Goserelin injection What is this medicine? GOSERELIN (GOE se rel in) is similar to a hormone found  in the body. It lowers the amount of sex hormones that the body makes. Men will have lower testosterone levels and women will have lower estrogen levels while taking this medicine. In men, this medicine is used to treat prostate  cancer; the injection is either given once per month or once every 12 weeks. A once per month injection (only) is used to treat women with endometriosis, dysfunctional uterine bleeding, or advanced breast cancer. This medicine may be used for other purposes; ask your health care provider or pharmacist if you have questions. What should I tell my health care provider before I take this medicine? They need to know if you have any of these conditions (some only apply to women): -diabetes -heart disease or previous heart attack -high blood pressure -high cholesterol -kidney disease -osteoporosis or low bone density -problems passing urine -spinal cord injury -stroke -tobacco smoker -an unusual or allergic reaction to goserelin, hormone therapy, other medicines, foods, dyes, or preservatives -pregnant or trying to get pregnant -breast-feeding How should I use this medicine? This medicine is for injection under the skin. It is given by a health care professional in a hospital or clinic setting. Men receive this injection once every 4 weeks or once every 12 weeks. Women will only receive the once every 4 weeks injection. Talk to your pediatrician regarding the use of this medicine in children. Special care may be needed. Overdosage: If you think you have taken too much of this medicine contact a poison control center or emergency room at once. NOTE: This medicine is only for you. Do not share this medicine with others. What if I miss a dose? It is important not to miss your dose. Call your doctor or health care professional if you are unable to keep an appointment. What may interact with this medicine? -female hormones like estrogen -herbal or dietary supplements like black cohosh, chasteberry, or DHEA -female hormones like testosterone -prasterone This list may not describe all possible interactions. Give your health care provider a list of all the medicines, herbs, non-prescription drugs, or  dietary supplements you use. Also tell them if you smoke, drink alcohol, or use illegal drugs. Some items may interact with your medicine. What should I watch for while using this medicine? Visit your doctor or health care professional for regular checks on your progress. Your symptoms may appear to get worse during the first weeks of this therapy. Tell your doctor or healthcare professional if your symptoms do not start to get better or if they get worse after this time. Your bones may get weaker if you take this medicine for a long time. If you smoke or frequently drink alcohol you may increase your risk of bone loss. A family history of osteoporosis, chronic use of drugs for seizures (convulsions), or corticosteroids can also increase your risk of bone loss. Talk to your doctor about how to keep your bones strong. This medicine should stop regular monthly menstration in women. Tell your doctor if you continue to [facility]. Women should not become pregnant while taking this medicine or for 12 weeks after stopping this medicine. Women should inform their doctor if they wish to become pregnant or think they might be pregnant. There is a potential for serious side effects to an unborn child. Talk to your health care professional or pharmacist for more information. Do not breast-feed an infant while taking this medicine. Men should inform their doctors if they wish to father a child. This medicine may lower sperm counts.  Talk to your health care professional or pharmacist for more information. What side effects may I notice from receiving this medicine? Side effects that you should report to your doctor or health care professional as soon as possible: -allergic reactions like skin rash, itching or hives, swelling of the face, lips, or tongue -bone pain -breathing problems -changes in vision -chest pain -feeling faint or lightheaded, falls -fever, chills -pain, swelling, warmth in the leg -pain,  tingling, numbness in the hands or feet -swelling of the ankles, feet, hands -trouble passing urine or change in the amount of urine -unusually high or low blood pressure -unusually weak or tired Side effects that usually do not require medical attention (report to your doctor or health care professional if they continue or are bothersome): -change in sex drive or performance -changes in breast size in both males and females -changes in emotions or moods -headache -hot flashes -irritation at site where injected -loss of appetite -skin problems like acne, dry skin -vaginal dryness This list may not describe all possible side effects. Call your doctor for medical advice about side effects. You may report side effects to FDA at 1-800-FDA-1088. Where should I keep my medicine? This drug is given in a hospital or clinic and will not be stored at home. NOTE: This sheet is a summary. It may not cover all possible information. If you have questions about this medicine, talk to your doctor, pharmacist, or health care provider.  2013, [employer]. (04/15/2009 1:28:29 PM)

## 2013-06-14 ENCOUNTER — Other Ambulatory Visit: Payer: Self-pay | Admitting: Oncology

## 2013-06-14 ENCOUNTER — Other Ambulatory Visit (HOSPITAL_BASED_OUTPATIENT_CLINIC_OR_DEPARTMENT_OTHER): Payer: No Typology Code available for payment source | Admitting: Lab

## 2013-06-14 ENCOUNTER — Ambulatory Visit (HOSPITAL_BASED_OUTPATIENT_CLINIC_OR_DEPARTMENT_OTHER): Payer: No Typology Code available for payment source | Admitting: Oncology

## 2013-06-14 ENCOUNTER — Encounter: Payer: Self-pay | Admitting: Oncology

## 2013-06-14 ENCOUNTER — Telehealth (INDEPENDENT_AMBULATORY_CARE_PROVIDER_SITE_OTHER): Payer: Self-pay

## 2013-06-14 VITALS — BP 120/80 | HR 100 | Temp 98.6°F | Resp 20 | Ht 65.5 in | Wt 225.6 lb

## 2013-06-14 DIAGNOSIS — C50219 Malignant neoplasm of upper-inner quadrant of unspecified female breast: Secondary | ICD-10-CM

## 2013-06-14 DIAGNOSIS — C50912 Malignant neoplasm of unspecified site of left female breast: Secondary | ICD-10-CM

## 2013-06-14 DIAGNOSIS — C50919 Malignant neoplasm of unspecified site of unspecified female breast: Secondary | ICD-10-CM

## 2013-06-14 DIAGNOSIS — C50212 Malignant neoplasm of upper-inner quadrant of left female breast: Secondary | ICD-10-CM

## 2013-06-14 LAB — CBC WITH DIFFERENTIAL/PLATELET
Basophils Absolute: 0 10*3/uL (ref 0.0–0.1)
Eosinophils Absolute: 0 10*3/uL (ref 0.0–0.5)
HCT: 22.2 % — ABNORMAL LOW (ref 34.8–46.6)
HGB: 7.4 g/dL — ABNORMAL LOW (ref 11.6–15.9)
LYMPH%: 19.7 % (ref 14.0–49.7)
MCV: 68.9 fL — ABNORMAL LOW (ref 79.5–101.0)
MONO%: 0.9 % (ref 0.0–14.0)
NEUT#: 1.6 10*3/uL (ref 1.5–6.5)
Platelets: 560 10*3/uL — ABNORMAL HIGH (ref 145–400)

## 2013-06-14 LAB — COMPREHENSIVE METABOLIC PANEL (CC13)
Albumin: 2.5 g/dL — ABNORMAL LOW (ref 3.5–5.0)
Alkaline Phosphatase: 129 U/L (ref 40–150)
BUN: 8.2 mg/dL (ref 7.0–26.0)
CO2: 28 mEq/L (ref 22–29)
Glucose: 110 mg/dl (ref 70–140)
Total Bilirubin: 0.4 mg/dL (ref 0.20–1.20)

## 2013-06-14 MED ORDER — FENTANYL 25 MCG/HR TD PT72: 1.0000 | MEDICATED_PATCH | TRANSDERMAL | Status: DC

## 2013-06-14 MED ORDER — OXYCODONE HCL 5 MG PO TABS: 5.0000 mg | ORAL_TABLET | ORAL | Status: DC | PRN

## 2013-06-14 MED ORDER — ZOLPIDEM TARTRATE 5 MG PO TABS: 5.0000 mg | ORAL_TABLET | Freq: Every evening | ORAL | Status: DC | PRN

## 2013-06-14 NOTE — Telephone Encounter (Signed)
Called pt to notify her that she will be getting a call from [facility] about another MRI bx of the left breast to be scheduled.

## 2013-06-14 NOTE — Progress Notes (Signed)
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    PRIOR THERAPY: [age] yof otherwise healthy works in [employer], [employer] (consulting) who had left sided breast mass show up about 6 months ago. 2 months ago this began getting larger rapidly. She was seen in an urgent care where this mass was thought to be an abscess. Apparently there was an attempt to excise or drain this that was not successful and she was referred to Dr. Dwain Sarna for evaluation. She underwent punch biopsy on 04/23/13 of her skin and the mass. She was then also sent for mm/us. The biopsy shows invasive high grade carcinoma that appears to be breast. It is ER 14%, PR negative, HER-2/neu negative with a Ki-67 that approaches 100%. She has also undergone mm with at least 10 cm upper inner left breast tumor with mild overyling skin thickening. There is also a 3x10 mm cluster of calcs near there also. There is indeterminate 7 mm level 1 left axillary node, she underwent a lymph node biopsy that was positive for disease. No mm evidence of right breast malignancy.  An MRI of the breasts was done on 05/22/13 and showed a 14 cm mass in the left breast protruding from the skin and also a 2.5 cm level II axillary node.   2.  Patient evaluated and began on neoadjuvant chemotherapy.  Adriamycin and Cytoxan began on 05/25/13.    #3 staging PET/CT performed on 06/13/2013 revealed no evidence of distant disease.  #4 anemia of iron deficiency  CURRENT THERAPY: Cycle 2 day 8 of dose dense A/C  INTERVAL HISTORY: Erika [name] 42 y.o. female returns for f/u today.  Clinically patient seems to be doing well. She tolerated second cycle of her chemotherapy very nicely. She denies any nausea vomiting fevers chills night sweats. She is however having difficulty with sleeping. She also is complaining of significant pain  off-and-on especially after her Neulasta injections. She has no pins and needles she has no bleeding currently. She has no back pain. Her breast exam did reveal significantly reduced size in her breast mass as well as the lesion in subcutaneously. She is very pleased with the results. Remainder of the 10 point review of systems is negative. MEDICAL HISTORY: Past Medical History  Diagnosis Date  . Breast cancer   . Headache(784.0)     Hx: of Migraines    ALLERGIES:  has No Known Allergies.  MEDICATIONS:  Current Outpatient Prescriptions  Medication Sig Dispense Refill  . acetaminophen (TYLENOL) 500 MG tablet Take 1,000 mg by mouth every 6 (six) hours as needed for pain.      Marland Kitchen dexamethasone (DECADRON) 4 MG tablet Take 2 daily starting day after chemo x 3 days.  30 tablet  2  . ibuprofen (ADVIL,MOTRIN) 200 MG tablet Take 200 mg by mouth every 6 (six) hours as needed for pain (600-800 mg prn left breast pain).      Marland Kitchen lidocaine-prilocaine (EMLA) cream Apply topically as needed. Apply to port  1-2 hours before procedure.  30 g  2  . LORazepam (ATIVAN) 0.5 MG tablet Take 1 tablet (0.5 mg total) by mouth every 8 (eight) hours.  30 tablet  1  . ondansetron (ZOFRAN) 8 MG tablet 1 po q12 hours as needed starting 72 hours after chemo.  30 tablet  2  . oxyCODONE-acetaminophen (ROXICET) 5-325 MG per  tablet Take 1 tablet by mouth every 4 (four) hours as needed for pain.  30 tablet  0  . ciprofloxacin (CIPRO) 500 MG tablet Take 1 tablet (500 mg total) by mouth 2 (two) times daily.  14 tablet  0  . fentaNYL (DURAGESIC - DOSED MCG/HR) 25 MCG/HR Place 1 patch (25 mcg total) onto the skin every 3 (three) days.  5 patch  0  . oxyCODONE (OXY IR/ROXICODONE) 5 MG immediate release tablet Take 1 tablet (5 mg total) by mouth every 4 (four) hours as needed for pain.  30 tablet  0  . prochlorperazine (COMPAZINE) 10 MG tablet Take 1 tablet (10 mg total) by mouth every 6 (six) hours as needed.  30 tablet  2  . zolpidem  (AMBIEN) 5 MG tablet Take 1 tablet (5 mg total) by mouth at bedtime as needed for sleep.  30 tablet  0   No current facility-administered medications for this visit.   Facility-Administered Medications Ordered in Other Visits  Medication Dose Route Frequency Provider Last Rate Last Dose  . goserelin (ZOLADEX) injection 3.6 mg  3.6 mg Subcutaneous Q28 days Victorino December, MD   3.6 mg at 06/13/13 1721    SURGICAL HISTORY:  Past Surgical History  Procedure Laterality Date  . Tubal ligation    . Portacath placement Right 05/22/2013    Procedure: INSERTION PORT-A-CATH;  Surgeon: Emelia Loron, MD;  Location: [facility] OR;  Service: General;  Laterality: Right;    REVIEW OF SYSTEMS:  General: fatigue (+), night sweats (-), fever (-), pain (+) Lymph: palpable nodes (-) HEENT: vision changes (-), mucositis (-), gum bleeding (-), epistaxis (-) Cardiovascular: chest pain (-), palpitations (-) Pulmonary: shortness of breath (-), dyspnea on exertion (-), cough (-), hemoptysis (-) GI:  Early satiety (-), melena (-), dysphagia (-), nausea/vomiting (-), diarrhea (-) GU: dysuria (-), hematuria (-), incontinence (-) Musculoskeletal: joint swelling (-), joint pain (-), back pain (-) Neuro: weakness (-), numbness (-), headache (-), confusion (-) Skin: Rash (-), lesions (-), dryness (-) Psych: depression (-), suicidal/homicidal ideation (-), feeling of hopelessness (-)    PHYSICAL EXAMINATION: Blood pressure 120/80, pulse 100, temperature 98.6 F (37 C), temperature source Oral, resp. rate 20, height 5' 5.5" (1.664 m), weight 225 lb 9.6 oz (102.331 kg), last menstrual period 05/30/2013. Body mass index is 36.96 kg/(m^2). General: Patient is a well appearing female in no acute distress HEENT: PERRLA, sclerae anicteric no conjunctival pallor, MMM Neck: supple, no palpable adenopathy Lungs: clear to auscultation bilaterally, no wheezes, rhonchi, or rales Cardiovascular: regular rate rhythm, S1, S2, no  murmurs, rubs or gallops Abdomen: Soft, non-tender, non-distended, normoactive bowel sounds, no HSM Extremities: warm and well perfused, no clubbing, cyanosis, or edema Skin: No rashes or lesions Neuro: Non-focal Breasts: left breast with large mass protruding from the skin, about 5-7cm, covered with a bandage/abd pad ECOG PERFORMANCE STATUS: 1 - Symptomatic but completely ambulatory  LABORATORY DATA: Lab Results  Component Value Date   WBC 2.1* 06/14/2013   HGB 7.4* 06/14/2013   HCT 22.2* 06/14/2013   MCV 68.9* 06/14/2013   PLT 560* 06/14/2013      Chemistry      Component Value Date/Time   NA 139 06/14/2013 1123   NA 139 05/18/2013 1205   K 3.2* 06/14/2013 1123   K 3.8 05/18/2013 1205   CL 102 06/01/2013 1500   CL 103 05/18/2013 1205   CO2 28 06/14/2013 1123   CO2 28 05/18/2013 1205   BUN 8.2 06/14/2013  1123   BUN 8 05/18/2013 1205   CREATININE 0.7 06/14/2013 1123   CREATININE 0.84 05/18/2013 1205      Component Value Date/Time   CALCIUM 9.4 06/14/2013 1123   CALCIUM 9.1 05/18/2013 1205   ALKPHOS 129 06/14/2013 1123   ALKPHOS 111 05/18/2013 1205   AST 10 06/14/2013 1123   AST 18 05/18/2013 1205   ALT 14 06/14/2013 1123   ALT 16 05/18/2013 1205   BILITOT 0.40 06/14/2013 1123   BILITOT 0.2* 05/18/2013 1205       RADIOGRAPHIC STUDIES:  Chest 2 View  05/18/2013   *RADIOLOGY REPORT*  Clinical Data: Breast carcinoma  CHEST - 2 VIEW  Comparison: None.  Findings: Probable left breast mass. Lungs clear.  Heart size and pulmonary vascularity normal.  No effusion.  Visualized bones unremarkable.  IMPRESSION: No acute disease   Original Report Authenticated By: D. Andria Rhein, MD   US Breast Left  05/08/2013   *RADIOLOGY REPORT*  Clinical Data:  42 year old female with large left breast mass - recent punch biopsy demonstrating high-grade carcinoma.  DIGITAL DIAGNOSTIC BILATERAL MAMMOGRAM WITH CAD AND LEFT BREAST ULTRASOUND:  Comparison:  None.  Findings:  ACR Breast Density Category 2: There is a scattered fibroglandular  pattern.  A large irregular mass within the upper inner posterior left breast is identified - measuring at least 10 cm. Mild overlying skin thickening is identified compatible with dermal invasion. A 3 x 10 mm cluster of slightly heterogeneous left breast calcifications approximate 4 cm anterior and lateral to this mass is identified.  There is no evidence of suspicious mass, distortion or worrisome calcifications within the right breast. Mammographic images were processed with CAD.  On physical exam, a very large protuberant mass within the upper inner left breast is identified.  Ultrasound is performed, showing a large heterogeneous mass within the upper inner left breast at the 10-11 o'clock position, measuring at least 6.5 cm, but evaluation is limited by transducer skin area and protuberant portion of the mass. A 7 mm level I left axillary lymph node is identified with slightly echogenic rim - suspicious.  IMPRESSION: Large biopsy-proven carcinoma within the upper inner left breast, measuring at least 10 cm mammographically. Mild overlying skin thickening compatible with dermal invasion.  3 x 10 mm cluster of indeterminate left breast calcifications 4 cm anterior and lateral to the biopsy-proven neoplasm.  This may represent DCIS and consider tissue sampling.  Indeterminate 7 mm level I left axillary lymph node - metastatic disease not excluded.  No mammographic evidence of right breast malignancy.  BI-RADS CATEGORY 6:  Known biopsy-proven malignancy - appropriate action should be taken.  RECOMMENDATION: Treatment plan.  Consider biopsies of the indeterminate left axillary lymph node and left breast calcifications as clinically indicated.  Consider bilateral breast MRI as clinically indicated.  I have discussed the findings and recommendations with the patient. Results were also provided in writing at the conclusion of the visit.   Original Report Authenticated By: Harmon Pier, M.D.   Mr Breast Bilateral W Wo  Contrast  05/22/2013   *RADIOLOGY REPORT*  Clinical Data: large invasive carcinoma left upper inner quadrant with dermal involvement  BILATERAL BREAST MRI WITH AND WITHOUT CONTRAST  Technique: Multiplanar, multisequence MR images of both breasts were obtained prior to and following the intravenous administration of 20ml of Multihance.  Three dimensional images were evaluated at the independent DynaCad workstation.  Comparison:  05/08/13  Findings: There is moderate background parenchymal enhancement. The right breast and axilla  are negative.  On the left, there is a large irregular mass with irregular borders in the upper inner quadrant, demonstrating heterogenous internal enhancement.  The mass measures 14 (AP x 7.5 (lateral) x 8 (craniocaudal)cm.  It causes protrusion of the skin of the breast in the 10 - 11 o'clock position, and it is associated with edematous change in the skin of the left breast, primarily in the upper inner quadrant but also in the periareolar in more infra- areolar left breast.  The mass extends through pectoralis major musculature posteriorly, with enhancing mass extending all the way to the posterior edge of the muscle.  In the 2 o'clock position posteriorly there is a 9mm oval enhancing mass with a mixture of plateau and washout kinetics; it is an indeterminate finding but shows the suggestion of a fatty hilum, and it may be an intramammary lymph node.  In the left axilla, there is an enlarged level II lymph node demonstrating hypervascularity and cortical thickening.  It measures 2.5cm in greatest dimension, and the appearance is concerning for metastasis.  There are no other abnormalities on the left.  IMPRESSION: Large left breast malignancy associated with involvement of the dermis and the chest wall, as well as with probable axillary lymph node metastasis.  There is an indeterminate 9mm mass in the 2 o'clock position, that may be an intramammary lymph node.  BI-RADS CATEGORY 6:  Known  biopsy-proven malignancy - appropriate action should be taken.  THREE-DIMENSIONAL MR IMAGE RENDERING ON INDEPENDENT WORKSTATION:  Three-dimensional MR images were rendered by post-processing of the original MR data on an independent workstation.  The three- dimensional MR images were interpreted, and findings were reported in the accompanying complete MRI report for this study.   Original Report Authenticated By: Esperanza Heir, M.D.   Dg Chest Port 1 View  05/22/2013   *RADIOLOGY REPORT*  Clinical Data: Port-A-Cath placement.  PORTABLE CHEST - 1 VIEW  Comparison: 05/18/2013  Findings: Right-sided power injectable Port-A-Cath noted with distal tip projecting over the SVC.  Low lung volumes are present, causing crowding of the pulmonary vasculature.  No pneumothorax noted.  Bilateral midlung subsegmental atelectasis noted.  Borderline cardiomegaly.  IMPRESSION:  1.  Power injectable catheter tip:  SVC.  No pneumothorax.   Original Report Authenticated By: Gaylyn Rong, M.D.   Mm Digital Diagnostic Bilat  05/08/2013   *RADIOLOGY REPORT*  Clinical Data:  42 year old female with large left breast mass - recent punch biopsy demonstrating high-grade carcinoma.  DIGITAL DIAGNOSTIC BILATERAL MAMMOGRAM WITH CAD AND LEFT BREAST ULTRASOUND:  Comparison:  None.  Findings:  ACR Breast Density Category 2: There is a scattered fibroglandular pattern.  A large irregular mass within the upper inner posterior left breast is identified - measuring at least 10 cm. Mild overlying skin thickening is identified compatible with dermal invasion. A 3 x 10 mm cluster of slightly heterogeneous left breast calcifications approximate 4 cm anterior and lateral to this mass is identified.  There is no evidence of suspicious mass, distortion or worrisome calcifications within the right breast. Mammographic images were processed with CAD.  On physical exam, a very large protuberant mass within the upper inner left breast is identified.   Ultrasound is performed, showing a large heterogeneous mass within the upper inner left breast at the 10-11 o'clock position, measuring at least 6.5 cm, but evaluation is limited by transducer skin area and protuberant portion of the mass. A 7 mm level I left axillary lymph node is identified with slightly echogenic rim -  suspicious.  IMPRESSION: Large biopsy-proven carcinoma within the upper inner left breast, measuring at least 10 cm mammographically. Mild overlying skin thickening compatible with dermal invasion.  3 x 10 mm cluster of indeterminate left breast calcifications 4 cm anterior and lateral to the biopsy-proven neoplasm.  This may represent DCIS and consider tissue sampling.  Indeterminate 7 mm level I left axillary lymph node - metastatic disease not excluded.  No mammographic evidence of right breast malignancy.  BI-RADS CATEGORY 6:  Known biopsy-proven malignancy - appropriate action should be taken.  RECOMMENDATION: Treatment plan.  Consider biopsies of the indeterminate left axillary lymph node and left breast calcifications as clinically indicated.  Consider bilateral breast MRI as clinically indicated.  I have discussed the findings and recommendations with the patient. Results were also provided in writing at the conclusion of the visit.   Original Report Authenticated By: Harmon Pier, M.D.   Dg Fluoro Guide Cv Line-no Report  05/22/2013   CLINICAL DATA: surgery   FLOURO GUIDE CV LINE  Fluoroscopy was utilized by the requesting physician.  No radiographic  interpretation.    [place] Lt Breast Bx W Loc Dev 1st Lesion Img Bx Spec US Guide  05/22/2013   **ADDENDUM** CREATED: 05/22/2013 11:12:58  The patient was called by myself, Dr. Micheline Maze today.  The patient states she is doing well since her left axillary node biopsy without problems at the biopsy site.  The patient was given the results of her biopsy which was compatible with metastatic invasive mammary carcinoma.  This is concordant with imaging  findings and patient's known history of recent positive biopsy of a large inner upper left breast mass demonstrating invasive mammary carcinoma. The patient had her breast MRI this morning and will receive Port-A- Cath placement by Dr. Dwain Sarna this afternoon.  Recommend follow- up pending MRI results and per treatment plan.  **END ADDENDUM** SIGNED BY: Melton Alar. Micheline Maze, M.D.  05/21/2013   *RADIOLOGY REPORT*  Clinical Data:  New diagnosis of invasive mammary carcinoma in the left upper inner quadrant.  Abnormal lymph node noted on ultrasound dated 05/08/2013.  ULTRASOUND GUIDED CORE BIOPSY OF THE LEFT AXILLA  The patient and I discussed the procedure of ultrasound-guided biopsy, including benefits and alternatives.  We discussed the high likelihood of a successful procedure. We discussed the risks of the procedure, including infection, bleeding, tissue injury, clip migration, and inadequate sampling.  Written informed consent was given. Appropriate time-out was performed.  Using sterile technique, 2% lidocaine, ultrasound guidance, and a 14 gauge automated biopsy device, biopsy was performed of the abnormal left axillary lymph node using a lateromedial approach. At the conclusion of the procedure, a dumbbell shaped tissue marker clip was deployed into the biopsy cavity. Post biopsy mammogram was not performed.  IMPRESSION: Ultrasound guided biopsy of an abnormal left axillary lymph node. No apparent complications.  Original Report Authenticated By: Cain Saupe, M.D.    ASSESSMENT:   1. Patient is a 42 year old female with high grade invasive breast carcinoma ER 14%, PR negative, HER-2/neu negative.  She was evaluated by Dr. Dwain Sarna.  She will undergo neoadjuvant chemotherapy with dose dense Adriamycin/Cytoxan x 4 cycles followed by weekly Taxol/Carbo x 12 weeks.  She has underwent port placement and chemotherapy class.    #2 patient had CT and PET scan performed. She has no evidence of metastatic  disease distantly.  #3 she was begun on neoadjuvant chemotherapy consisting of Adriamycin Cytoxan given Q2 weekly beginning on 06/08/2013. Overall she is tolerating this  well.  #4 anemia due to menorrhagia. She is now status post therapy heme given on 06/13/2013.  #5 pain  #6 insomnia   PLAN:  #1 patient is doing well. After only 2 cycles of your chemotherapy the left breast mass has significantly reduced in size.  #2 we discussed CT scan results today and you have no evidence of metastatic disease in the bones or the liver.  #3 I have given her prescriptions for pain medications including fentanyl patch which you are to apply every 72 hours. I also gave you a prescription for OxyIR 5 mg to be taken as needed for pain.  #4 for sleep I gave her a prescription for Ambien 5 mg to be taken half hour prior to your scheduled bedtime.  #5 keep appointment with Dr. Dwain Sarna for a biopsy of one of the breast nodules.  #6 status post IV iron and Zoladex  #7 I will see her back on 06/22/2013 for cycle #3 of chemotherapy.  All questions were answered. The patient knows to call the clinic with any problems, questions or concerns. We can certainly see the patient much sooner if necessary.  I spent 25 minutes counseling the patient face to face. The total time spent in the appointment was 30 minutes.  Drue Second, MD Medical/Oncology [facility] [phone] (beeper) [phone] (Office)  06/14/2013, 1:59 PM

## 2013-06-14 NOTE — Addendum Note (Signed)
Addended by: Ethlyn Gallery on: 06/14/2013 02:17 PM   Modules accepted: Orders

## 2013-06-14 NOTE — Telephone Encounter (Signed)
Erika [name] called for clarification on what mass they are to biopsy? She is wondering if this is the 9 mm mass seen in the 2 oclock position of the left breast on patient's last MR or if it is something seen on the PET/CT scans and if so what does Dr Dwain Sarna want biopsied? Please clarify.

## 2013-06-14 NOTE — Telephone Encounter (Signed)
Called Erika [name] back to confirm about the MRI guided bx order. I told Erika [name] that I canceled the first order for the MRI and she helped me place the 2nd order which is describing an area in the left breast at the 2o'clock position of a 9mm enhancing mass that might be a possible intrammamory lymph node. Erika [name] is working on getting this scheduled.

## 2013-06-14 NOTE — Telephone Encounter (Signed)
LMOM notifying her that I placed an order in epic for the pt to get a MRI bx of the left br scheduled.

## 2013-06-14 NOTE — Patient Instructions (Addendum)
#  1 you are doing well. After only 2 cycles of your chemotherapy the left breast mass has significantly reduced in size.  #2 we discussed CT scan results today and you have no evidence of metastatic disease in the bones or the liver.  #3 I have given you prescriptions for pain medications including fentanyl patch which you are to apply every 72 hours. I also gave you a prescription for OxyIR 5 mg to be taken as needed for pain.  #4 for sleep I gave you a prescription for Ambien 5 mg to be taken half hour prior to your scheduled bedtime.  #5 keep your appointment with Dr. Dwain Sarna for a biopsy of one of the breast nodules.  #6 I will see you back on 06/22/2013 for cycle #3 of chemotherapy.

## 2013-06-15 NOTE — Progress Notes (Signed)
OFFICE PROGRESS NOTE  CC  Erika Flake, MD [address] [place] [address] Dr. 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 2 months ago this began getting larger rapidly. She was seen in an urgent care where this mass was thought to be an abscess. Apparently there was an attempt to excise or drain this that was not successful and she was referred to Dr. Dwain Sarna for evaluation. She underwent punch biopsy on 04/23/13 of her skin and the mass. She was then also sent for mm/us. The biopsy shows invasive high grade carcinoma that appears to be breast. It is ER 14%, PR negative, HER-2/neu negative with a Ki-67 that approaches 100%. She has also undergone mm with at least 10 cm upper inner left breast tumor with mild overyling skin thickening. There is also a 3x10 mm cluster of calcs near there also. There is indeterminate 7 mm level 1 left axillary node, she underwent a lymph node biopsy that was positive for disease. No mm evidence of right breast malignancy. An MRI of the breasts was done on 05/22/13 and showed a 14 cm mass in the left breast protruding from the skin and also a 2.5 cm level II axillary node.   #2. Patient evaluated and began on neoadjuvant chemotherapy. Adriamycin and Cytoxan began on 05/25/13.   #3 staging PET/CT to be performed for staging purposes  #4 anemia of iron deficiency   CURRENT THERAPY: Cycle 1 day 1 of Adriamycin and Cytoxan  INTERVAL HISTORY: Erika [name] 42 y.o. female returns for  followup visit today. She is very anxious and nervous to begin her chemotherapy today. She does continue to have pain in her left breast. She also is married. All fatigued. This is due to her anemia. She denies any nausea or vomiting no fevers chills night sweats no back pain abdominal pain. Remainder of the 10 point review of systems is negative.  MEDICAL HISTORY: Past Medical History  Diagnosis Date  . Breast cancer   . Headache(784.0)     Hx: of Migraines    ALLERGIES:  has No Known Allergies.  MEDICATIONS:  Current Outpatient Prescriptions  Medication Sig Dispense Refill  . acetaminophen (TYLENOL) 500 MG tablet Take 1,000 mg by mouth every 6 (six) hours as needed for pain.      Marland Kitchen ibuprofen (ADVIL,MOTRIN) 200 MG tablet Take 200 mg by mouth every 6 (six) hours as needed for pain (600-800 mg prn left breast pain).      Marland Kitchen lidocaine-prilocaine (EMLA) cream Apply topically as needed. Apply to port  1-2 hours before procedure.  30 g  2  . ciprofloxacin (CIPRO) 500 MG tablet Take 1 tablet (500 mg total) by mouth 2 (two) times daily.  14 tablet  0  . dexamethasone (DECADRON) 4 MG tablet Take 2 daily starting day after chemo x 3 days.  30 tablet  2  . fentaNYL (DURAGESIC - DOSED MCG/HR) 25 MCG/HR Place 1 patch (25 mcg total) onto the skin every 3 (three)  days.  5 patch  0  . LORazepam (ATIVAN) 0.5 MG tablet Take 1 tablet (0.5 mg total) by mouth every 8 (eight) hours.  30 tablet  1  . ondansetron (ZOFRAN) 8 MG tablet 1 po q12 hours as needed starting 72 hours after chemo.  30 tablet  2  . oxyCODONE (OXY IR/ROXICODONE) 5 MG immediate release tablet Take 1 tablet (5 mg total) by mouth every 4 (four) hours as needed for pain.  30 tablet  0  . oxyCODONE-acetaminophen (ROXICET) 5-325 MG per tablet Take 1 tablet by mouth every 4 (four) hours as needed for pain.  30 tablet  0  . prochlorperazine (COMPAZINE) 10 MG tablet Take 1 tablet (10 mg total) by mouth every 6 (six) hours as needed.  30 tablet  2  .  zolpidem (AMBIEN) 5 MG tablet Take 1 tablet (5 mg total) by mouth at bedtime as needed for sleep.  30 tablet  0   No current facility-administered medications for this visit.    SURGICAL HISTORY:  Past Surgical History  Procedure Laterality Date  . Tubal ligation    . Portacath placement Right 05/22/2013    Procedure: INSERTION PORT-A-CATH;  Surgeon: Emelia Loron, MD;  Location: [facility] OR;  Service: General;  Laterality: Right;    REVIEW OF SYSTEMS:  Pertinent items are noted in HPI.   HEALTH MAINTENANCE:  PHYSICAL EXAMINATION: Blood pressure 119/81, pulse 91, temperature 98.3 F (36.8 C), temperature source Oral, resp. rate 18, height 5' 5.5" (1.664 m), weight 231 lb 14.4 oz (105.189 kg), last menstrual period 04/27/2013. Body mass index is 37.99 kg/(m^2). ECOG PERFORMANCE STATUS: 0 - Asymptomatic   General appearance: alert, cooperative and appears stated age Lymph nodes: Cervical, supraclavicular, and axillary nodes normal. Cardio: regular rate and rhythm GI: soft, non-tender; bowel sounds normal; no masses,  no organomegaly Extremities: extremities normal, atraumatic, no cyanosis or edema Neurologic: Grossly normal Right breast: No masses nipple discharge or skin changes. Left breast reveals enlarged breast with a palpable mass covering all quadrants there are nodules noted. There is no nipple reduction or discharge or bleeding.  LABORATORY DATA:     RADIOGRAPHIC STUDIES:  Chest 2 View  05/18/2013   *RADIOLOGY REPORT*  Clinical Data: Breast carcinoma  CHEST - 2 VIEW  Comparison: None.  Findings: Probable left breast mass. Lungs clear.  Heart size and pulmonary vascularity normal.  No effusion.  Visualized bones unremarkable.  IMPRESSION: No acute disease   Original Report Authenticated By: D. Andria Rhein, MD   Ct Chest W Contrast  06/13/2013   *RADIOLOGY REPORT*  Clinical Data:  Newly diagnosed stage III left breast cancer  CT CHEST, ABDOMEN AND PELVIS WITH CONTRAST   Technique:  Multidetector CT imaging of the chest, abdomen and pelvis was performed following the standard protocol during bolus administration of intravenous contrast.  Contrast: OMNIPAQUE IOHEXOL 300 MG/ML  SOLN  Comparison:  Concurrent PET CT dated 06/13/2013  CT CHEST  Findings:  6.4 x 4.7 cm mass in the medial left breast (series 2/image 19), corresponding to known primary breast cancer. Overlying skin thickening/involvement (series 2/image 16). Posteriorly, the mass abuts/involves the medial aspect of the left pectoralis muscle (series 2/image 19).  Associated 6 mm short axis left axillary node (series 2/image 20). Adjacent surgical clip (series 2/image 22), corresponding to biopsy- proven nodal metastasis.  Minimal tree-in-bud nodularity in the posterior right lower lobe (series 5/image 29).  Mild suspected nodular scarring in the inferior right middle lobe (series 5/image 38).  No suspicious pulmonary nodules. No pleural effusion or pneumothorax.  Visualized thyroid is unremarkable.  The heart is normal in size.  No pericardial effusion.  No suspicious mediastinal, hilar, or right axillary lymphadenopathy.  Very mild degenerative changes of the thoracic spine.  Mild thoracic dextroscoliosis.  No focal osseous lesions.  IMPRESSION: 6.4 cm mass in the medial left breast, corresponding to known primary breast cancer.  Overlying skin thickening/involvement. Mass abuts/involves the left pectoralis muscle.  Associated 6 mm short-axis left axillary node, indeterminate but non-FDG-avid on concurrent PET.  Adjacent surgical clip, corresponding to biopsy-proven nodal metastasis.  No evidence of distant metastases.  CT ABDOMEN AND PELVIS  Findings:  Liver is notable for focal fat/altered perfusion along the falciform ligament (series 2/image 55).  Spleen, pancreas, and adrenal glands are within normal limits.  Numerous layering gallstones.  No intrahepatic or extrahepatic ductal dilatation.  Kidneys are within  normal limits.  No hydronephrosis.  No evidence of bowel obstruction.  Normal appendix.  No evidence of abdominal aortic aneurysm.  No abdominopelvic ascites.  No suspicious abdominopelvic lymphadenopathy.  Uterus and bilateral ovaries unremarkable.  Bladder is within normal limits.  Mild degenerative changes at L5-S1.  No focal osseous lesions.  IMPRESSION: No evidence of metastatic disease in the abdomen/pelvis.  Cholelithiasis, without associated inflammatory changes.   Original Report Authenticated By: Charline Bills, M.D.   Ct Abdomen Pelvis W Contrast  06/13/2013   *RADIOLOGY REPORT*  Clinical Data:  Newly diagnosed stage III left breast cancer  CT CHEST, ABDOMEN AND PELVIS WITH CONTRAST  Technique:  Multidetector CT imaging of the chest, abdomen and pelvis was performed following the standard protocol during bolus administration of intravenous contrast.  Contrast: OMNIPAQUE IOHEXOL 300 MG/ML  SOLN  Comparison:  Concurrent PET CT dated 06/13/2013  CT CHEST  Findings:  6.4 x 4.7 cm mass in the medial left breast (series 2/image 19), corresponding to known primary breast cancer. Overlying skin thickening/involvement (series 2/image 16). Posteriorly, the mass abuts/involves the medial aspect of the left pectoralis muscle (series 2/image 19).  Associated 6 mm short axis left axillary node (series 2/image 20). Adjacent surgical clip (series 2/image 22), corresponding to biopsy- proven nodal metastasis.  Minimal tree-in-bud nodularity in the posterior right lower lobe (series 5/image 29).  Mild suspected nodular scarring in the inferior right middle lobe (series 5/image 38).  No suspicious pulmonary nodules. No pleural effusion or pneumothorax.  Visualized thyroid is unremarkable.  The heart is normal in size.  No pericardial effusion.  No suspicious mediastinal, hilar, or right axillary lymphadenopathy.  Very mild degenerative changes of the thoracic spine.  Mild thoracic dextroscoliosis.  No focal  osseous lesions.  IMPRESSION: 6.4 cm mass in the medial left breast, corresponding to known primary breast cancer.  Overlying skin thickening/involvement. Mass abuts/involves the left pectoralis muscle.  Associated 6 mm short-axis left axillary node, indeterminate but non-FDG-avid on concurrent PET.  Adjacent surgical clip, corresponding to biopsy-proven nodal metastasis.  No evidence of distant metastases.  CT ABDOMEN AND PELVIS  Findings:  Liver is notable for focal fat/altered perfusion along the falciform ligament (series 2/image 55).  Spleen, pancreas, and adrenal glands are within normal limits.  Numerous layering gallstones.  No intrahepatic or extrahepatic ductal dilatation.  Kidneys are within normal limits.  No hydronephrosis.  No evidence of bowel obstruction.  Normal appendix.  No evidence of abdominal aortic aneurysm.  No abdominopelvic ascites.  No suspicious abdominopelvic lymphadenopathy.  Uterus and bilateral ovaries unremarkable.  Bladder is within normal  limits.  Mild degenerative changes at L5-S1.  No focal osseous lesions.  IMPRESSION: No evidence of metastatic disease in the abdomen/pelvis.  Cholelithiasis, without associated inflammatory changes.   Original Report Authenticated By: Charline Bills, M.D.   Mr Breast Bilateral W Wo Contrast  05/22/2013   *RADIOLOGY REPORT*  Clinical Data: large invasive carcinoma left upper inner quadrant with dermal involvement  BILATERAL BREAST MRI WITH AND WITHOUT CONTRAST  Technique: Multiplanar, multisequence MR images of both breasts were obtained prior to and following the intravenous administration of 20ml of Multihance.  Three dimensional images were evaluated at the independent DynaCad workstation.  Comparison:  05/08/13  Findings: There is moderate background parenchymal enhancement. The right breast and axilla are negative.  On the left, there is a large irregular mass with irregular borders in the upper inner quadrant, demonstrating heterogenous  internal enhancement.  The mass measures 14 (AP x 7.5 (lateral) x 8 (craniocaudal)cm.  It causes protrusion of the skin of the breast in the 10 - 11 o'clock position, and it is associated with edematous change in the skin of the left breast, primarily in the upper inner quadrant but also in the periareolar in more infra- areolar left breast.  The mass extends through pectoralis major musculature posteriorly, with enhancing mass extending all the way to the posterior edge of the muscle.  In the 2 o'clock position posteriorly there is a 9mm oval enhancing mass with a mixture of plateau and washout kinetics; it is an indeterminate finding but shows the suggestion of a fatty hilum, and it may be an intramammary lymph node.  In the left axilla, there is an enlarged level II lymph node demonstrating hypervascularity and cortical thickening.  It measures 2.5cm in greatest dimension, and the appearance is concerning for metastasis.  There are no other abnormalities on the left.  IMPRESSION: Large left breast malignancy associated with involvement of the dermis and the chest wall, as well as with probable axillary lymph node metastasis.  There is an indeterminate 9mm mass in the 2 o'clock position, that may be an intramammary lymph node.  BI-RADS CATEGORY 6:  Known biopsy-proven malignancy - appropriate action should be taken.  THREE-DIMENSIONAL MR IMAGE RENDERING ON INDEPENDENT WORKSTATION:  Three-dimensional MR images were rendered by post-processing of the original MR data on an independent workstation.  The three- dimensional MR images were interpreted, and findings were reported in the accompanying complete MRI report for this study.   Original Report Authenticated By: Esperanza Heir, M.D.   Nm Pet Image Initial (pi) Skull Base To Thigh  06/13/2013   *RADIOLOGY REPORT*  Clinical Data: Initial treatment strategy for newly diagnosed stage III breast cancer.  NUCLEAR MEDICINE PET SKULL BASE TO THIGH  Fasting Blood  Glucose:  92  Technique:  20.1 mCi F-18 FDG was injected intravenously. CT data was obtained and used for attenuation correction and anatomic localization only.  (This was not acquired as a diagnostic CT examination.) Additional exam technical data entered on technologist worksheet.  Comparison:  Concurrent CT chest abdomen pelvis dated 06/13/2013  Findings:  Neck: No hypermetabolic lymph nodes in the neck.  Chest:  Large medial left breast mass, max SUV 17.4, corresponding to known primary breast cancer.  Associated involvement of the overlying dermis and suspected involvement of the underlying pectoralis muscle.  Surgical clip in the left axilla (series 2/image 73), corresponding to biopsy-proven nodal metastasis.  Medially, there is an additional 6 mm short-axis left axillary node (series 2/image 70) which is non-FDG-avid, max  SUV 1.2.  No hypermetabolic mediastinal or hilar nodes.  No suspicious pulmonary nodules on the CT scan.  Abdomen/Pelvis:  No abnormal hypermetabolic activity within the liver, pancreas, adrenal glands, or spleen.  No hypermetabolic lymph nodes in the abdomen or pelvis.  Skeleton:  Diffuse osseous hypermetabolism, without focal hypermetabolic activity to suggest skeletal metastasis.  IMPRESSION: Large medial left breast mass, max SUV 17.4, corresponding to known primary breast cancer.  6 mm short-axis left axillary node, non-FDG-avid, max SUV 1.2. Adjacent biopsy clip.  No evidence of distant metastases.   Original Report Authenticated By: Charline Bills, M.D.   Dg Chest Port 1 View  05/22/2013   *RADIOLOGY REPORT*  Clinical Data: Port-A-Cath placement.  PORTABLE CHEST - 1 VIEW  Comparison: 05/18/2013  Findings: Right-sided power injectable Port-A-Cath noted with distal tip projecting over the SVC.  Low lung volumes are present, causing crowding of the pulmonary vasculature.  No pneumothorax noted.  Bilateral midlung subsegmental atelectasis noted.  Borderline cardiomegaly.   IMPRESSION:  1.  Power injectable catheter tip:  SVC.  No pneumothorax.   Original Report Authenticated By: Gaylyn Rong, M.D.   Dg Fluoro Guide Cv Line-no Report  05/22/2013   CLINICAL DATA: surgery   FLOURO GUIDE CV LINE  Fluoroscopy was utilized by the requesting physician.  No radiographic  interpretation.    [place] Lt Breast Bx W Loc Dev 1st Lesion Img Bx Spec US Guide  05/22/2013   **ADDENDUM** CREATED: 05/22/2013 11:12:58  The patient was called by myself, Dr. Micheline Maze today.  The patient states she is doing well since her left axillary node biopsy without problems at the biopsy site.  The patient was given the results of her biopsy which was compatible with metastatic invasive mammary carcinoma.  This is concordant with imaging findings and patient's known history of recent positive biopsy of a large inner upper left breast mass demonstrating invasive mammary carcinoma. The patient had her breast MRI this morning and will receive Port-A- Cath placement by Dr. Dwain Sarna this afternoon.  Recommend follow- up pending MRI results and per treatment plan.  **END ADDENDUM** SIGNED BY: Melton Alar. Micheline Maze, M.D.  05/21/2013   *RADIOLOGY REPORT*  Clinical Data:  New diagnosis of invasive mammary carcinoma in the left upper inner quadrant.  Abnormal lymph node noted on ultrasound dated 05/08/2013.  ULTRASOUND GUIDED CORE BIOPSY OF THE LEFT AXILLA  The patient and I discussed the procedure of ultrasound-guided biopsy, including benefits and alternatives.  We discussed the high likelihood of a successful procedure. We discussed the risks of the procedure, including infection, bleeding, tissue injury, clip migration, and inadequate sampling.  Written informed consent was given. Appropriate time-out was performed.  Using sterile technique, 2% lidocaine, ultrasound guidance, and a 14 gauge automated biopsy device, biopsy was performed of the abnormal left axillary lymph node using a lateromedial approach. At the conclusion  of the procedure, a dumbbell shaped tissue marker clip was deployed into the biopsy cavity. Post biopsy mammogram was not performed.  IMPRESSION: Ultrasound guided biopsy of an abnormal left axillary lymph node. No apparent complications.  Original Report Authenticated By: Cain Saupe, M.D.    ASSESSMENT: 42 year old female with  #1. New diagnosis of  high grade invasive breast carcinoma ER 14%, PR negative, HER-2/neu negative. She was evaluated by Dr. Dwain Sarna. She will undergo neoadjuvant chemotherapy with dose dense Adriamycin/Cytoxan x 4 cycles followed by weekly Taxol/Carbo x 12 weeks. She has underwent port placement and chemotherapy class.   #2 patient needs to have  staging scans performed and these will be ordered.  #3 she will proceed with cycle 1 of neoadjuvant chemotherapy consisting of Adriamycin Cytoxan today with day 2 Neulasta tomorrow. Risks and benefits of treatment were explained to the patient in detail.  #4 patient is anemic and she will need iron studies and may even require IV iron.  PLAN:   #1 proceed with scheduled chemotherapy today.  #2 patient will return in one week's time for followup.  #3 we will recommend IV iron depending on her iron studies. She may also require Zoladex injection 2 prevent menstrual cycles.   All questions were answered. The patient knows to call the clinic with any problems, questions or concerns. We can certainly see the patient much sooner if necessary.  I spent 25 minutes counseling the patient face to face. The total time spent in the appointment was 30 minutes.    Drue Second, MD Medical/Oncology [facility] [phone] (beeper) [phone] (Office)

## 2013-06-18 ENCOUNTER — Encounter (INDEPENDENT_AMBULATORY_CARE_PROVIDER_SITE_OTHER): Payer: PRIVATE HEALTH INSURANCE | Admitting: Surgery

## 2013-06-20 NOTE — Telephone Encounter (Signed)
Pt called with MRI date for 06/22/13.

## 2013-06-22 ENCOUNTER — Encounter: Payer: Self-pay | Admitting: Oncology

## 2013-06-22 ENCOUNTER — Ambulatory Visit
Admission: RE | Admit: 2013-06-22 | Discharge: 2013-06-22 | Disposition: A | Discharge status: Home / Self Care | Payer: No Typology Code available for payment source | Source: Ambulatory Visit | Attending: General Surgery | Admitting: General Surgery

## 2013-06-22 ENCOUNTER — Other Ambulatory Visit: Payer: Self-pay | Admitting: Medical Oncology

## 2013-06-22 ENCOUNTER — Ambulatory Visit (HOSPITAL_BASED_OUTPATIENT_CLINIC_OR_DEPARTMENT_OTHER): Payer: No Typology Code available for payment source | Admitting: Oncology

## 2013-06-22 ENCOUNTER — Ambulatory Visit (HOSPITAL_BASED_OUTPATIENT_CLINIC_OR_DEPARTMENT_OTHER): Payer: No Typology Code available for payment source

## 2013-06-22 ENCOUNTER — Other Ambulatory Visit (HOSPITAL_BASED_OUTPATIENT_CLINIC_OR_DEPARTMENT_OTHER): Payer: No Typology Code available for payment source | Admitting: Lab

## 2013-06-22 ENCOUNTER — Other Ambulatory Visit: Payer: Self-pay | Admitting: Oncology

## 2013-06-22 VITALS — BP 114/78 | HR 102 | Temp 98.2°F | Resp 20 | Ht 65.5 in | Wt 222.5 lb

## 2013-06-22 DIAGNOSIS — Z5111 Encounter for antineoplastic chemotherapy: Secondary | ICD-10-CM

## 2013-06-22 DIAGNOSIS — C50219 Malignant neoplasm of upper-inner quadrant of unspecified female breast: Secondary | ICD-10-CM

## 2013-06-22 DIAGNOSIS — C773 Secondary and unspecified malignant neoplasm of axilla and upper limb lymph nodes: Secondary | ICD-10-CM

## 2013-06-22 DIAGNOSIS — M898X9 Other specified disorders of bone, unspecified site: Secondary | ICD-10-CM

## 2013-06-22 DIAGNOSIS — D649 Anemia, unspecified: Secondary | ICD-10-CM

## 2013-06-22 DIAGNOSIS — G4700 Insomnia, unspecified: Secondary | ICD-10-CM

## 2013-06-22 DIAGNOSIS — N920 Excessive and frequent menstruation with regular cycle: Secondary | ICD-10-CM

## 2013-06-22 DIAGNOSIS — R52 Pain, unspecified: Secondary | ICD-10-CM

## 2013-06-22 DIAGNOSIS — C50912 Malignant neoplasm of unspecified site of left female breast: Secondary | ICD-10-CM

## 2013-06-22 DIAGNOSIS — C50919 Malignant neoplasm of unspecified site of unspecified female breast: Secondary | ICD-10-CM

## 2013-06-22 DIAGNOSIS — C50212 Malignant neoplasm of upper-inner quadrant of left female breast: Secondary | ICD-10-CM

## 2013-06-22 LAB — CBC WITH DIFFERENTIAL/PLATELET
Basophils Absolute: 0 10*3/uL (ref 0.0–0.1)
Eosinophils Absolute: 0 10*3/uL (ref 0.0–0.5)
HGB: 8 g/dL — ABNORMAL LOW (ref 11.6–15.9)
MCV: 74 fL — ABNORMAL LOW (ref 79.5–101.0)
MONO#: 0.4 10*3/uL (ref 0.1–0.9)
MONO%: 5.1 % (ref 0.0–14.0)
NEUT#: 5.8 10*3/uL (ref 1.5–6.5)
RDW: 20.1 % — ABNORMAL HIGH (ref 11.2–14.5)
WBC: 7.3 10*3/uL (ref 3.9–10.3)

## 2013-06-22 LAB — COMPREHENSIVE METABOLIC PANEL (CC13)
Albumin: 2.8 g/dL — ABNORMAL LOW (ref 3.5–5.0)
Alkaline Phosphatase: 116 U/L (ref 40–150)
BUN: 3.6 mg/dL — ABNORMAL LOW (ref 7.0–26.0)
CO2: 28 mEq/L (ref 22–29)
Calcium: 9.5 mg/dL (ref 8.4–10.4)
Chloride: 104 mEq/L (ref 98–109)
Glucose: 94 mg/dl (ref 70–140)
Potassium: 2.9 mEq/L — CL (ref 3.5–5.1)
Sodium: 141 mEq/L (ref 136–145)
Total Protein: 7.4 g/dL (ref 6.4–8.3)

## 2013-06-22 MED ORDER — GADOBENATE DIMEGLUMINE 529 MG/ML IV SOLN
20.0000 mL | Freq: Once | INTRAVENOUS | Status: AC | PRN
  Administered 2013-06-22: 20 mL via INTRAVENOUS

## 2013-06-22 MED ORDER — SODIUM CHLORIDE 0.9 % IV SOLN
Freq: Once | INTRAVENOUS | Status: AC
  Administered 2013-06-22: 13:00:00 via INTRAVENOUS

## 2013-06-22 MED ORDER — LORAZEPAM 2 MG/ML IJ SOLN
0.5000 mg | Freq: Once | INTRAMUSCULAR | Status: AC
  Administered 2013-06-22: 0.5 mg via INTRAVENOUS

## 2013-06-22 MED ORDER — PALONOSETRON HCL INJECTION 0.25 MG/5ML
0.2500 mg | Freq: Once | INTRAVENOUS | Status: AC
  Administered 2013-06-22: 0.25 mg via INTRAVENOUS

## 2013-06-22 MED ORDER — SODIUM CHLORIDE 0.9 % IV SOLN
600.0000 mg/m2 | Freq: Once | INTRAVENOUS | Status: AC
  Administered 2013-06-22: 1320 mg via INTRAVENOUS
  Filled 2013-06-22: qty 66

## 2013-06-22 MED ORDER — DEXAMETHASONE SODIUM PHOSPHATE 20 MG/5ML IJ SOLN
12.0000 mg | Freq: Once | INTRAMUSCULAR | Status: AC
  Administered 2013-06-22: 12 mg via INTRAVENOUS

## 2013-06-22 MED ORDER — OXYCODONE-ACETAMINOPHEN 5-325 MG PO TABS: 1.0000 | ORAL_TABLET | ORAL | Status: DC | PRN

## 2013-06-22 MED ORDER — SODIUM CHLORIDE 0.9 % IJ SOLN
10.0000 mL | INTRAMUSCULAR | Status: DC | PRN
  Administered 2013-06-22: 10 mL
  Filled 2013-06-22: qty 10

## 2013-06-22 MED ORDER — HEPARIN SOD (PORK) LOCK FLUSH 100 UNIT/ML IV SOLN
500.0000 [IU] | Freq: Once | INTRAVENOUS | Status: AC | PRN
  Administered 2013-06-22: 500 [IU]
  Filled 2013-06-22: qty 5

## 2013-06-22 MED ORDER — POTASSIUM CHLORIDE CRYS ER 20 MEQ PO TBCR: 20.0000 meq | EXTENDED_RELEASE_TABLET | Freq: Two times a day (BID) | ORAL | Status: DC

## 2013-06-22 MED ORDER — DOXORUBICIN HCL CHEMO IV INJECTION 2 MG/ML
60.0000 mg/m2 | Freq: Once | INTRAVENOUS | Status: AC
  Administered 2013-06-22: 132 mg via INTRAVENOUS
  Filled 2013-06-22: qty 66

## 2013-06-22 MED ORDER — SODIUM CHLORIDE 0.9 % IV SOLN
150.0000 mg | Freq: Once | INTRAVENOUS | Status: AC
  Administered 2013-06-22: 150 mg via INTRAVENOUS
  Filled 2013-06-22: qty 5

## 2013-06-22 NOTE — Progress Notes (Signed)
OK to treat with Hgb 8 per dr. Welton Flakes.  TKF

## 2013-06-22 NOTE — Patient Instructions (Addendum)
Proceed with scheduled chemotherapy cycle 3 of AC  Return on 7/12 for injection  Take potasium 20 meq twice a day for 7 days  We will see you back in 1 week for follow up and labs

## 2013-06-22 NOTE — Patient Instructions (Addendum)
[facility] Discharge Instructions for Patients Receiving Chemotherapy  Today you received the following chemotherapy agents: adriamycin, cytoxan  To help prevent nausea and vomiting after your treatment, we encourage you to take your nausea medication.  Take it as often as prescribed.     If you develop nausea and vomiting that is not controlled by your nausea medication, call the clinic. If it is after clinic hours your family physician or the after hours number for the clinic or go to the Emergency Department.   BELOW ARE SYMPTOMS THAT SHOULD BE REPORTED IMMEDIATELY:  *FEVER GREATER THAN 100.5 F  *CHILLS WITH OR WITHOUT FEVER  NAUSEA AND VOMITING THAT IS NOT CONTROLLED WITH YOUR NAUSEA MEDICATION  *UNUSUAL SHORTNESS OF BREATH  *UNUSUAL BRUISING OR BLEEDING  TENDERNESS IN MOUTH AND THROAT WITH OR WITHOUT PRESENCE OF ULCERS  *URINARY PROBLEMS  *BOWEL PROBLEMS  UNUSUAL RASH Items with * indicate a potential emergency and should be followed up as soon as possible.  Feel free to call the clinic you have any questions or concerns. The clinic phone number is [phone].   I have been informed and understand all the instructions given to me. I know to contact the clinic, my physician, or go to the Emergency Department if any problems should occur. I do not have any questions at this time, but understand that I may call the clinic during office hours   should I have any questions or need assistance in obtaining follow up care.    __________________________________________  _____________  __________ Signature of Patient or Authorized Representative            Date                   Time    __________________________________________ Nurse's Signature    

## 2013-06-22 NOTE — Progress Notes (Signed)
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    PRIOR THERAPY: 1. 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 2 months ago this began getting larger rapidly. She was seen in an urgent care where this mass was thought to be an abscess. Apparently there was an attempt to excise or drain this that was not successful and she was referred to Dr. Dwain Sarna for evaluation. She underwent punch biopsy on 04/23/13 of her skin and the mass. She was then also sent for mm/us. The biopsy shows invasive high grade carcinoma that appears to be breast. It is ER 14%, PR negative, HER-2/neu negative with a Ki-67 that approaches 100%. She has also undergone mm with at least 10 cm upper inner left breast tumor with mild overyling skin thickening. There is also a 3x10 mm cluster of calcs near there also. There is indeterminate 7 mm level 1 left axillary node, she underwent a lymph node biopsy that was positive for disease. No mm evidence of right breast malignancy.  An MRI of the breasts was done on 05/22/13 and showed a 14 cm mass in the left breast protruding from the skin and also a 2.5 cm level II axillary node.   2.  Patient evaluated and began on neoadjuvant chemotherapy.  Adriamycin and Cytoxan began on 05/25/13.    #3 staging PET/CT performed on 06/13/2013 revealed no evidence of distant disease.  #4 anemia of iron deficiency  CURRENT THERAPY: Cycle 3 day 1 of dose dense A/C  INTERVAL HISTORY: Erika [name] 42 y.o. female returns for f/u today.  Clinically patient seems to be doing well.  She denies any nausea vomiting fevers chills night sweats. She is however having difficulty with sleeping. She also is complaining of significant pain off-and-on especially after her Neulasta injections. She  has no pins and needles she has no bleeding currently. She has no back pain. Her breast exam did reveal significantly reduced size in her breast mass as well as the lesion in subcutaneously. She is very pleased with the results. Remainder of the 10 point review of systems is negative. MEDICAL HISTORY: Past Medical History  Diagnosis Date  . Breast cancer   . Headache(784.0)     Hx: of Migraines    ALLERGIES:  has No Known Allergies.  MEDICATIONS:  Current Outpatient Prescriptions  Medication Sig Dispense Refill  . acetaminophen (TYLENOL) 500 MG tablet Take 1,000 mg by mouth every 6 (six) hours as needed for pain.      Marland Kitchen dexamethasone (DECADRON) 4 MG tablet Take 2 daily starting day after chemo x 3 days.  30 tablet  2  . fentaNYL (DURAGESIC - DOSED MCG/HR) 25 MCG/HR Place 1 patch (25 mcg total) onto the skin every 3 (three) days.  5 patch  0  . ibuprofen (ADVIL,MOTRIN) 200 MG tablet Take 200 mg by mouth every 6 (six) hours as needed for pain (600-800 mg prn left breast pain).      Marland Kitchen lidocaine-prilocaine (EMLA) cream Apply topically as needed. Apply to port  1-2 hours before procedure.  30 g  2  . LORazepam (ATIVAN) 0.5 MG tablet Take 1 tablet (0.5 mg total) by mouth every 8 (eight) hours.  30 tablet  1  . ondansetron (ZOFRAN) 8 MG tablet 1 po q12  hours as needed starting 72 hours after chemo.  30 tablet  2  . oxyCODONE (OXY IR/ROXICODONE) 5 MG immediate release tablet Take 1 tablet (5 mg total) by mouth every 4 (four) hours as needed for pain.  30 tablet  0  . oxyCODONE-acetaminophen (ROXICET) 5-325 MG per tablet Take 1 tablet by mouth every 4 (four) hours as needed for pain.  30 tablet  0  . prochlorperazine (COMPAZINE) 10 MG tablet Take 1 tablet (10 mg total) by mouth every 6 (six) hours as needed.  30 tablet  2  . zolpidem (AMBIEN) 5 MG tablet Take 1 tablet (5 mg total) by mouth at bedtime as needed for sleep.  30 tablet  0  . ciprofloxacin (CIPRO) 500 MG tablet Take 1 tablet (500 mg  total) by mouth 2 (two) times daily.  14 tablet  0   No current facility-administered medications for this visit.    SURGICAL HISTORY:  Past Surgical History  Procedure Laterality Date  . Tubal ligation    . Portacath placement Right 05/22/2013    Procedure: INSERTION PORT-A-CATH;  Surgeon: Emelia Loron, MD;  Location: [facility] OR;  Service: General;  Laterality: Right;    REVIEW OF SYSTEMS:  General: fatigue (+), night sweats (-), fever (-), pain (+) Lymph: palpable nodes (-) HEENT: vision changes (-), mucositis (-), gum bleeding (-), epistaxis (-) Cardiovascular: chest pain (-), palpitations (-) Pulmonary: shortness of breath (-), dyspnea on exertion (-), cough (-), hemoptysis (-) GI:  Early satiety (-), melena (-), dysphagia (-), nausea/vomiting (-), diarrhea (-) GU: dysuria (-), hematuria (-), incontinence (-) Musculoskeletal: joint swelling (-), joint pain (-), back pain (-) Neuro: weakness (-), numbness (-), headache (-), confusion (-) Skin: Rash (-), lesions (-), dryness (-) Psych: depression (-), suicidal/homicidal ideation (-), feeling of hopelessness (-)    PHYSICAL EXAMINATION: Blood pressure 114/78, pulse 102, temperature 98.2 F (36.8 C), temperature source Oral, resp. rate 20, height 5' 5.5" (1.664 m), weight 222 lb 8 oz (100.925 kg), last menstrual period 05/30/2013. Body mass index is 36.45 kg/(m^2). General: Patient is a well appearing female in no acute distress HEENT: PERRLA, sclerae anicteric no conjunctival pallor, MMM Neck: supple, no palpable adenopathy Lungs: clear to auscultation bilaterally, no wheezes, rhonchi, or rales Cardiovascular: regular rate rhythm, S1, S2, no murmurs, rubs or gallops Abdomen: Soft, non-tender, non-distended, normoactive bowel sounds, no HSM Extremities: warm and well perfused, no clubbing, cyanosis, or edema Skin: No rashes or lesions Neuro: Non-focal Breasts: left breast with large mass protruding from the skin, about 5-7cm,  covered with a bandage/abd pad ECOG PERFORMANCE STATUS: 1 - Symptomatic but completely ambulatory  LABORATORY DATA: Lab Results  Component Value Date   WBC 7.3 06/22/2013   HGB 8.0* 06/22/2013   HCT 26.2* 06/22/2013   MCV 74.0* 06/22/2013   PLT 327 06/22/2013      Chemistry      Component Value Date/Time   NA 139 06/14/2013 1123   NA 139 05/18/2013 1205   K 3.2* 06/14/2013 1123   K 3.8 05/18/2013 1205   CL 102 06/01/2013 1500   CL 103 05/18/2013 1205   CO2 28 06/14/2013 1123   CO2 28 05/18/2013 1205   BUN 8.2 06/14/2013 1123   BUN 8 05/18/2013 1205   CREATININE 0.7 06/14/2013 1123   CREATININE 0.84 05/18/2013 1205      Component Value Date/Time   CALCIUM 9.4 06/14/2013 1123   CALCIUM 9.1 05/18/2013 1205   ALKPHOS 129 06/14/2013 1123   ALKPHOS 111  05/18/2013 1205   AST 10 06/14/2013 1123   AST 18 05/18/2013 1205   ALT 14 06/14/2013 1123   ALT 16 05/18/2013 1205   BILITOT 0.40 06/14/2013 1123   BILITOT 0.2* 05/18/2013 1205       RADIOGRAPHIC STUDIES:  Chest 2 View  05/18/2013   *RADIOLOGY REPORT*  Clinical Data: Breast carcinoma  CHEST - 2 VIEW  Comparison: None.  Findings: Probable left breast mass. Lungs clear.  Heart size and pulmonary vascularity normal.  No effusion.  Visualized bones unremarkable.  IMPRESSION: No acute disease   Original Report Authenticated By: D. Andria Rhein, MD   US Breast Left  05/08/2013   *RADIOLOGY REPORT*  Clinical Data:  42 year old female with large left breast mass - recent punch biopsy demonstrating high-grade carcinoma.  DIGITAL DIAGNOSTIC BILATERAL MAMMOGRAM WITH CAD AND LEFT BREAST ULTRASOUND:  Comparison:  None.  Findings:  ACR Breast Density Category 2: There is a scattered fibroglandular pattern.  A large irregular mass within the upper inner posterior left breast is identified - measuring at least 10 cm. Mild overlying skin thickening is identified compatible with dermal invasion. A 3 x 10 mm cluster of slightly heterogeneous left breast calcifications approximate 4 cm  anterior and lateral to this mass is identified.  There is no evidence of suspicious mass, distortion or worrisome calcifications within the right breast. Mammographic images were processed with CAD.  On physical exam, a very large protuberant mass within the upper inner left breast is identified.  Ultrasound is performed, showing a large heterogeneous mass within the upper inner left breast at the 10-11 o'clock position, measuring at least 6.5 cm, but evaluation is limited by transducer skin area and protuberant portion of the mass. A 7 mm level I left axillary lymph node is identified with slightly echogenic rim - suspicious.  IMPRESSION: Large biopsy-proven carcinoma within the upper inner left breast, measuring at least 10 cm mammographically. Mild overlying skin thickening compatible with dermal invasion.  3 x 10 mm cluster of indeterminate left breast calcifications 4 cm anterior and lateral to the biopsy-proven neoplasm.  This may represent DCIS and consider tissue sampling.  Indeterminate 7 mm level I left axillary lymph node - metastatic disease not excluded.  No mammographic evidence of right breast malignancy.  BI-RADS CATEGORY 6:  Known biopsy-proven malignancy - appropriate action should be taken.  RECOMMENDATION: Treatment plan.  Consider biopsies of the indeterminate left axillary lymph node and left breast calcifications as clinically indicated.  Consider bilateral breast MRI as clinically indicated.  I have discussed the findings and recommendations with the patient. Results were also provided in writing at the conclusion of the visit.   Original Report Authenticated By: Harmon Pier, M.D.   Mr Breast Bilateral W Wo Contrast  05/22/2013   *RADIOLOGY REPORT*  Clinical Data: large invasive carcinoma left upper inner quadrant with dermal involvement  BILATERAL BREAST MRI WITH AND WITHOUT CONTRAST  Technique: Multiplanar, multisequence MR images of both breasts were obtained prior to and following the  intravenous administration of 20ml of Multihance.  Three dimensional images were evaluated at the independent DynaCad workstation.  Comparison:  05/08/13  Findings: There is moderate background parenchymal enhancement. The right breast and axilla are negative.  On the left, there is a large irregular mass with irregular borders in the upper inner quadrant, demonstrating heterogenous internal enhancement.  The mass measures 14 (AP x 7.5 (lateral) x 8 (craniocaudal)cm.  It causes protrusion of the skin of the breast in the 10 -  11 o'clock position, and it is associated with edematous change in the skin of the left breast, primarily in the upper inner quadrant but also in the periareolar in more infra- areolar left breast.  The mass extends through pectoralis major musculature posteriorly, with enhancing mass extending all the way to the posterior edge of the muscle.  In the 2 o'clock position posteriorly there is a 9mm oval enhancing mass with a mixture of plateau and washout kinetics; it is an indeterminate finding but shows the suggestion of a fatty hilum, and it may be an intramammary lymph node.  In the left axilla, there is an enlarged level II lymph node demonstrating hypervascularity and cortical thickening.  It measures 2.5cm in greatest dimension, and the appearance is concerning for metastasis.  There are no other abnormalities on the left.  IMPRESSION: Large left breast malignancy associated with involvement of the dermis and the chest wall, as well as with probable axillary lymph node metastasis.  There is an indeterminate 9mm mass in the 2 o'clock position, that may be an intramammary lymph node.  BI-RADS CATEGORY 6:  Known biopsy-proven malignancy - appropriate action should be taken.  THREE-DIMENSIONAL MR IMAGE RENDERING ON INDEPENDENT WORKSTATION:  Three-dimensional MR images were rendered by post-processing of the original MR data on an independent workstation.  The three- dimensional MR images were  interpreted, and findings were reported in the accompanying complete MRI report for this study.   Original Report Authenticated By: Esperanza Heir, M.D.   Dg Chest Port 1 View  05/22/2013   *RADIOLOGY REPORT*  Clinical Data: Port-A-Cath placement.  PORTABLE CHEST - 1 VIEW  Comparison: 05/18/2013  Findings: Right-sided power injectable Port-A-Cath noted with distal tip projecting over the SVC.  Low lung volumes are present, causing crowding of the pulmonary vasculature.  No pneumothorax noted.  Bilateral midlung subsegmental atelectasis noted.  Borderline cardiomegaly.  IMPRESSION:  1.  Power injectable catheter tip:  SVC.  No pneumothorax.   Original Report Authenticated By: Gaylyn Rong, M.D.   Mm Digital Diagnostic Bilat  05/08/2013   *RADIOLOGY REPORT*  Clinical Data:  42 year old female with large left breast mass - recent punch biopsy demonstrating high-grade carcinoma.  DIGITAL DIAGNOSTIC BILATERAL MAMMOGRAM WITH CAD AND LEFT BREAST ULTRASOUND:  Comparison:  None.  Findings:  ACR Breast Density Category 2: There is a scattered fibroglandular pattern.  A large irregular mass within the upper inner posterior left breast is identified - measuring at least 10 cm. Mild overlying skin thickening is identified compatible with dermal invasion. A 3 x 10 mm cluster of slightly heterogeneous left breast calcifications approximate 4 cm anterior and lateral to this mass is identified.  There is no evidence of suspicious mass, distortion or worrisome calcifications within the right breast. Mammographic images were processed with CAD.  On physical exam, a very large protuberant mass within the upper inner left breast is identified.  Ultrasound is performed, showing a large heterogeneous mass within the upper inner left breast at the 10-11 o'clock position, measuring at least 6.5 cm, but evaluation is limited by transducer skin area and protuberant portion of the mass. A 7 mm level I left axillary lymph node is  identified with slightly echogenic rim - suspicious.  IMPRESSION: Large biopsy-proven carcinoma within the upper inner left breast, measuring at least 10 cm mammographically. Mild overlying skin thickening compatible with dermal invasion.  3 x 10 mm cluster of indeterminate left breast calcifications 4 cm anterior and lateral to the biopsy-proven neoplasm.  This may  represent DCIS and consider tissue sampling.  Indeterminate 7 mm level I left axillary lymph node - metastatic disease not excluded.  No mammographic evidence of right breast malignancy.  BI-RADS CATEGORY 6:  Known biopsy-proven malignancy - appropriate action should be taken.  RECOMMENDATION: Treatment plan.  Consider biopsies of the indeterminate left axillary lymph node and left breast calcifications as clinically indicated.  Consider bilateral breast MRI as clinically indicated.  I have discussed the findings and recommendations with the patient. Results were also provided in writing at the conclusion of the visit.   Original Report Authenticated By: Harmon Pier, M.D.   Dg Fluoro Guide Cv Line-no Report  05/22/2013   CLINICAL DATA: surgery   FLOURO GUIDE CV LINE  Fluoroscopy was utilized by the requesting physician.  No radiographic  interpretation.    [place] Lt Breast Bx W Loc Dev 1st Lesion Img Bx Spec US Guide  05/22/2013   **ADDENDUM** CREATED: 05/22/2013 11:12:58  The patient was called by myself, Dr. Micheline Maze today.  The patient states she is doing well since her left axillary node biopsy without problems at the biopsy site.  The patient was given the results of her biopsy which was compatible with metastatic invasive mammary carcinoma.  This is concordant with imaging findings and patient's known history of recent positive biopsy of a large inner upper left breast mass demonstrating invasive mammary carcinoma. The patient had her breast MRI this morning and will receive Port-A- Cath placement by Dr. Dwain Sarna this afternoon.  Recommend follow- up  pending MRI results and per treatment plan.  **END ADDENDUM** SIGNED BY: Melton Alar. Micheline Maze, M.D.  05/21/2013   *RADIOLOGY REPORT*  Clinical Data:  New diagnosis of invasive mammary carcinoma in the left upper inner quadrant.  Abnormal lymph node noted on ultrasound dated 05/08/2013.  ULTRASOUND GUIDED CORE BIOPSY OF THE LEFT AXILLA  The patient and I discussed the procedure of ultrasound-guided biopsy, including benefits and alternatives.  We discussed the high likelihood of a successful procedure. We discussed the risks of the procedure, including infection, bleeding, tissue injury, clip migration, and inadequate sampling.  Written informed consent was given. Appropriate time-out was performed.  Using sterile technique, 2% lidocaine, ultrasound guidance, and a 14 gauge automated biopsy device, biopsy was performed of the abnormal left axillary lymph node using a lateromedial approach. At the conclusion of the procedure, a dumbbell shaped tissue marker clip was deployed into the biopsy cavity. Post biopsy mammogram was not performed.  IMPRESSION: Ultrasound guided biopsy of an abnormal left axillary lymph node. No apparent complications.  Original Report Authenticated By: Cain Saupe, M.D.    ASSESSMENT:   1. Patient is a 42 year old female with high grade invasive breast carcinoma ER 14%, PR negative, HER-2/neu negative.  She was evaluated by Dr. Dwain Sarna.  She will undergo neoadjuvant chemotherapy with dose dense Adriamycin/Cytoxan x 4 cycles followed by weekly Taxol/Carbo x 12 weeks.  She has underwent port placement and chemotherapy class.    #2 patient had CT and PET scan performed. She has no evidence of metastatic disease distantly.  #3 she was begun on neoadjuvant chemotherapy consisting of Adriamycin Cytoxan given Q2 weekly beginning on 06/08/2013. Overall she is tolerating this well.  #4 anemia due to menorrhagia. She is now status post therapy heme given on 06/13/2013.  #5 pain  #6  insomnia   PLAN:  Proceed with scheduled chemotherapy cycle 3 of AC  Return on 7/12 for injection  Take potasium 20 meq twice a day  for 7 days  We will see you back in 1 week for follow up and labs    All questions were answered. The patient knows to call the clinic with any problems, questions or concerns. We can certainly see the patient much sooner if necessary.  I spent 25 minutes counseling the patient face to face. The total time spent in the appointment was 30 minutes.  Drue Second, MD Medical/Oncology [facility] [phone] (beeper) [phone] (Office)  06/22/2013, 12:06 PM

## 2013-06-23 ENCOUNTER — Ambulatory Visit (HOSPITAL_BASED_OUTPATIENT_CLINIC_OR_DEPARTMENT_OTHER): Payer: No Typology Code available for payment source

## 2013-06-23 VITALS — BP 130/74 | HR 96 | Temp 96.4°F

## 2013-06-23 DIAGNOSIS — C50219 Malignant neoplasm of upper-inner quadrant of unspecified female breast: Secondary | ICD-10-CM

## 2013-06-23 DIAGNOSIS — Z5189 Encounter for other specified aftercare: Secondary | ICD-10-CM

## 2013-06-23 MED ORDER — PEGFILGRASTIM INJECTION 6 MG/0.6ML
6.0000 mg | Freq: Once | SUBCUTANEOUS | Status: AC
  Administered 2013-06-23: 6 mg via SUBCUTANEOUS

## 2013-06-23 NOTE — Patient Instructions (Addendum)

## 2013-06-26 ENCOUNTER — Encounter: Payer: Self-pay | Admitting: *Deleted

## 2013-06-26 NOTE — Progress Notes (Signed)
Mailed after appt letter to pt. 

## 2013-06-29 ENCOUNTER — Emergency Department (HOSPITAL_COMMUNITY)
Admission: EM | Admit: 2013-06-29 | Discharge: 2013-06-29 | Disposition: A | Discharge status: Home / Self Care | Payer: No Typology Code available for payment source | Attending: Emergency Medicine | Admitting: Emergency Medicine

## 2013-06-29 ENCOUNTER — Ambulatory Visit (HOSPITAL_COMMUNITY)
Admission: RE | Admit: 2013-06-29 | Discharge: 2013-06-29 | Disposition: A | Discharge status: Home / Self Care | Payer: No Typology Code available for payment source | Source: Ambulatory Visit | Attending: Oncology | Admitting: Oncology

## 2013-06-29 ENCOUNTER — Other Ambulatory Visit (HOSPITAL_BASED_OUTPATIENT_CLINIC_OR_DEPARTMENT_OTHER): Payer: No Typology Code available for payment source | Admitting: Lab

## 2013-06-29 ENCOUNTER — Ambulatory Visit (HOSPITAL_BASED_OUTPATIENT_CLINIC_OR_DEPARTMENT_OTHER): Payer: No Typology Code available for payment source | Admitting: Adult Health

## 2013-06-29 ENCOUNTER — Encounter (HOSPITAL_COMMUNITY): Payer: Self-pay | Admitting: Family Medicine

## 2013-06-29 ENCOUNTER — Encounter: Payer: Self-pay | Admitting: Adult Health

## 2013-06-29 ENCOUNTER — Telehealth: Payer: Self-pay | Admitting: *Deleted

## 2013-06-29 VITALS — BP 108/75 | HR 96 | Temp 98.7°F | Resp 20 | Ht 65.5 in | Wt 218.9 lb

## 2013-06-29 DIAGNOSIS — M899 Disorder of bone, unspecified: Secondary | ICD-10-CM

## 2013-06-29 DIAGNOSIS — C50919 Malignant neoplasm of unspecified site of unspecified female breast: Secondary | ICD-10-CM

## 2013-06-29 DIAGNOSIS — R2231 Localized swelling, mass and lump, right upper limb: Secondary | ICD-10-CM

## 2013-06-29 DIAGNOSIS — C50212 Malignant neoplasm of upper-inner quadrant of left female breast: Secondary | ICD-10-CM

## 2013-06-29 DIAGNOSIS — C50219 Malignant neoplasm of upper-inner quadrant of unspecified female breast: Secondary | ICD-10-CM

## 2013-06-29 DIAGNOSIS — M79609 Pain in unspecified limb: Secondary | ICD-10-CM | POA: Insufficient documentation

## 2013-06-29 DIAGNOSIS — M898X9 Other specified disorders of bone, unspecified site: Secondary | ICD-10-CM

## 2013-06-29 DIAGNOSIS — I82629 Acute embolism and thrombosis of deep veins of unspecified upper extremity: Secondary | ICD-10-CM | POA: Insufficient documentation

## 2013-06-29 DIAGNOSIS — R229 Localized swelling, mass and lump, unspecified: Secondary | ICD-10-CM

## 2013-06-29 DIAGNOSIS — E876 Hypokalemia: Secondary | ICD-10-CM

## 2013-06-29 DIAGNOSIS — I82401 Acute embolism and thrombosis of unspecified deep veins of right lower extremity: Secondary | ICD-10-CM

## 2013-06-29 DIAGNOSIS — D702 Other drug-induced agranulocytosis: Secondary | ICD-10-CM

## 2013-06-29 DIAGNOSIS — Z8679 Personal history of other diseases of the circulatory system: Secondary | ICD-10-CM | POA: Insufficient documentation

## 2013-06-29 DIAGNOSIS — I82409 Acute embolism and thrombosis of unspecified deep veins of unspecified lower extremity: Secondary | ICD-10-CM | POA: Insufficient documentation

## 2013-06-29 DIAGNOSIS — Z853 Personal history of malignant neoplasm of breast: Secondary | ICD-10-CM | POA: Insufficient documentation

## 2013-06-29 LAB — CBC WITH DIFFERENTIAL/PLATELET
Basophils Absolute: 0 10*3/uL (ref 0.0–0.1)
Eosinophils Absolute: 0 10*3/uL (ref 0.0–0.5)
HGB: 7.5 g/dL — ABNORMAL LOW (ref 11.6–15.9)
MONO#: 0 10*3/uL — ABNORMAL LOW (ref 0.1–0.9)
MONO%: 2.1 % (ref 0.0–14.0)
NEUT#: 0.6 10*3/uL — ABNORMAL LOW (ref 1.5–6.5)
RBC: 3.34 10*6/uL — ABNORMAL LOW (ref 3.70–5.45)
RDW: 20.9 % — ABNORMAL HIGH (ref 11.2–14.5)
WBC: 1 10*3/uL — ABNORMAL LOW (ref 3.9–10.3)
lymph#: 0.3 10*3/uL — ABNORMAL LOW (ref 0.9–3.3)
nRBC: 0 % (ref 0–0)

## 2013-06-29 LAB — COMPREHENSIVE METABOLIC PANEL (CC13)
ALT: 12 U/L (ref 0–55)
AST: 9 U/L (ref 5–34)
Albumin: 2.9 g/dL — ABNORMAL LOW (ref 3.5–5.0)
Alkaline Phosphatase: 117 U/L (ref 40–150)
Potassium: 2.9 mEq/L — CL (ref 3.5–5.1)
Sodium: 140 mEq/L (ref 136–145)
Total Protein: 7.1 g/dL (ref 6.4–8.3)

## 2013-06-29 MED ORDER — ENOXAPARIN (LOVENOX) PATIENT EDUCATION KIT
PACK | Freq: Once | Status: AC
  Administered 2013-06-29: 23:00:00
  Filled 2013-06-29: qty 1

## 2013-06-29 MED ORDER — ENOXAPARIN SODIUM 100 MG/ML ~~LOC~~ SOLN: 150.0000 mg | Freq: Every day | SUBCUTANEOUS | Status: DC

## 2013-06-29 MED ORDER — CIPROFLOXACIN HCL 500 MG PO TABS: 500.0000 mg | ORAL_TABLET | Freq: Two times a day (BID) | ORAL | Status: DC

## 2013-06-29 MED ORDER — POTASSIUM CHLORIDE CRYS ER 20 MEQ PO TBCR: 20.0000 meq | EXTENDED_RELEASE_TABLET | Freq: Three times a day (TID) | ORAL | Status: DC

## 2013-06-29 MED ORDER — OXYCODONE-ACETAMINOPHEN 5-325 MG PO TABS: 1.0000 | ORAL_TABLET | ORAL | Status: DC | PRN

## 2013-06-29 MED ORDER — ENOXAPARIN SODIUM 100 MG/ML ~~LOC~~ SOLN
100.0000 mg | Freq: Two times a day (BID) | SUBCUTANEOUS | Status: DC
  Administered 2013-06-29: 100 mg via SUBCUTANEOUS
  Filled 2013-06-29 (×2): qty 1

## 2013-06-29 NOTE — Patient Instructions (Addendum)
Patient Neutropenia Instruction Sheet  Diagnosis: Breast Cancer      Treating Physician: Drue Second, MD  Treatment: 1. Type of chemotherapy: AC  2. Date of last treatment: 06/22/13  Last Blood Counts: Lab Results  Component Value Date   WBC 1.0* 06/29/2013   HGB 7.5* 06/29/2013   HCT 24.5* 06/29/2013   MCV 73.4* 06/29/2013   PLT 298 06/29/2013   ANC600     Prophylactic Antibiotics: Cipro 500 mg by mouth twice a day Instructions: 1. Monitor temperature and call if fever  greater than 100.5, chills, shaking chills (rigors) 2. Call Physician on-call at [phone] 3. Give him/her symptoms and list of medications that you are taking and your last blood count.   Anemia, Frequently Asked Questions WHAT ARE THE SYMPTOMS OF ANEMIA?  Headache.  Difficulty thinking.  Fatigue.  Shortness of breath.  Weakness.  Rapid heartbeat. AT WHAT POINT ARE PEOPLE CONSIDERED ANEMIC?  This varies with gender and age.   Both hemoglobin (Hgb) and hematocrit values are used to define anemia. These lab values are obtained from a complete blood count (CBC) test. This is performed at a caregiver's office.  The normal range of hemoglobin values for adult men is 14.0 g/dL to 69.6 g/dL. For nonpregnant women, values are 12.3 g/dL to 29.5 g/dL.  The World Health Organization defines anemia as less than 12 g/dL for nonpregnant women and less than 13 g/dL for men.  For adult males, the average normal hematocrit is 46%, and the range is 40% to 52%.  For adult females, the average normal hematocrit is 41%, and the range is 35% to 47%.  Values that fall below the lower limits can be a sign of anemia and should have further checking (evaluation). GROUPS OF PEOPLE WHO ARE AT RISK FOR DEVELOPING ANEMIA INCLUDE:   Infants who are breastfed or taking a formula that is not fortified with iron.  Children going through a rapid growth spurt. The iron available can not keep up with the needs for a red cell  mass which must grow with the child.  Women in childbearing years. They need iron because of blood loss during menstruation.  Pregnant women. The growing fetus creates a high demand for iron.  People with ongoing gastrointestinal blood loss are at risk of developing iron deficiency.  Individuals with leukemia or cancer who must receive chemotherapy or radiation to treat their disease. The drugs or radiation used to treat these diseases often decreases the bone marrow's ability to make cells of all classes. This includes red blood cells, white blood cells, and platelets.  Individuals with chronic inflammatory conditions such as rheumatoid arthritis or chronic infections.  The elderly. ARE SOME TYPES OF ANEMIA INHERITED?   Yes, some types of anemia are due to inherited or genetic defects.  Sickle cell anemia. This occurs most often in people of African, African American, and Mediterranean descent.  Thalassemia (or Cooley's anemia). This type is found in people of Mediterranean and Southeast Asian descent. These types of anemia are common.  [place]coni. This is rare. CAN CERTAIN MEDICATIONS CAUSE A PERSON TO BECOME ANEMIC?  Yes. For example, drugs to fight cancer (chemotherapeutic agents) often cause anemia. These drugs can slow the bone marrow's ability to make red blood cells. If there are not enough red blood cells, the body does not get enough oxygen. WHAT HEMATOCRIT LEVEL IS REQUIRED TO DONATE BLOOD?  The lower limit of an acceptable hematocrit for blood donors is 38%. If you have a low  hematocrit value, you should schedule an appointment with your caregiver. ARE BLOOD TRANSFUSIONS COMMONLY USED TO CORRECT ANEMIA, AND ARE THEY DANGEROUS?  They are used to treat anemia as a last resort. Your caregiver will find the cause of the anemia and correct it if possible. Most blood transfusions are given because of excessive bleeding at the time of surgery, with trauma, or because of bone marrow  suppression in patients with cancer or leukemia on chemotherapy. Blood transfusions are safer than ever before. We also know that blood transfusions affect the immune system and may increase certain risks. There is also a concern for human error. In 1/16,000 transfusions, a patient receives a transfusion of blood that is not matched with his or her blood type.  WHAT IS IRON DEFICIENCY ANEMIA AND CAN I CORRECT IT BY CHANGING MY DIET?  Iron is an essential part of hemoglobin. Without enough hemoglobin, anemia develops and the body does not get the right amount of oxygen. Iron deficiency anemia develops after the body has had a low level of iron for a long time. This is either caused by blood loss, not taking in or absorbing enough iron, or increased demands for iron (like pregnancy or rapid growth).  Foods from animal origin such as beef, chicken, and pork, are good sources of iron. Be sure to have one of these foods at each meal. Vitamin C helps your body absorb iron. Foods rich in Vitamin C include citrus, bell pepper, strawberries, spinach and cantaloupe. In some cases, iron supplements may be needed in order to correct the iron deficiency. In the case of poor absorption, extra iron may have to be given directly into the vein through a needle (intravenously). I HAVE BEEN DIAGNOSED WITH IRON DEFICIENCY ANEMIA AND MY CAREGIVER PRESCRIBED IRON SUPPLEMENTS. HOW LONG WILL IT TAKE FOR MY BLOOD TO BECOME NORMAL?  It depends on the degree of anemia at the beginning of treatment. Most people with mild to moderate iron deficiency, anemia will correct the anemia over a period of 2 to 3 months. But after the anemia is corrected, the iron stored by the body is still low. Caregivers often suggest an additional 6 months of oral iron therapy once the anemia has been reversed. This will help prevent the iron deficiency anemia from quickly happening again. Non-anemic adult males should take iron supplements only under the  direction of a doctor, too much iron can cause liver damage.  MY HEMOGLOBIN IS 9 G/DL AND I AM SCHEDULED FOR SURGERY. SHOULD I POSTPONE THE SURGERY?  If you have Hgb of 9, you should discuss this with your caregiver right away. Many patients with similar hemoglobin levels have had surgery without problems. If minimal blood loss is expected for a minor procedure, no treatment may be necessary.  If a greater blood loss is expected for more extensive procedures, you should ask your caregiver about being treated with erythropoietin and iron. This is to accelerate the recovery of your hemoglobin to a normal level before surgery. An anemic patient who undergoes high-blood-loss surgery has a greater risk of surgical complications and need for a blood transfusion, which also carries some risk.  I HAVE BEEN TOLD THAT HEAVY MENSTRUAL PERIODS CAUSE ANEMIA. IS THERE ANYTHING I CAN DO TO PREVENT THE ANEMIA?  Anemia that results from heavy periods is usually due to iron deficiency. You can try to meet the increased demands for iron caused by the heavy monthly blood loss by increasing the intake of iron-rich foods. Iron supplements  may be required. Discuss your concerns with your caregiver. WHAT CAUSES ANEMIA DURING PREGNANCY?  Pregnancy places major demands on the body. The mother must meet the needs of both her body and her growing baby. The body needs enough iron and folate to make the right amount of red blood cells. To prevent anemia while pregnant, the mother should stay in close contact with her caregiver.  Be sure to eat a diet that has foods rich in iron and folate like liver and dark green leafy vegetables. Folate plays an important role in the normal development of a baby's spinal cord. Folate can help prevent serious disorders like spina bifida. If your diet does not provide adequate nutrients, you may want to talk with your caregiver about nutritional supplements.  WHAT IS THE RELATIONSHIP BETWEEN FIBROID  TUMORS AND ANEMIA IN WOMEN?  The relationship is usually caused by the increased menstrual blood loss caused by fibroids. Good iron intake may be required to prevent iron deficiency anemia from developing.  Document Released: 07/07/2004 Document Revised: 02/21/2012 Document Reviewed: 12/22/2010 [facility] Patient Information 2014 [place], [place].

## 2013-06-29 NOTE — Telephone Encounter (Signed)
appt made and printed. Pt agreed to go to [facility] today to get her droppler completed @ 4:30pm...td

## 2013-06-29 NOTE — ED Notes (Signed)
Pharmacy at bedside providing education about lovenox administration.

## 2013-06-29 NOTE — ED Notes (Signed)
Per pt sts 2 days of knot in her right arm. Pt sent here for positive blood clot in right upper extremity.

## 2013-06-29 NOTE — Progress Notes (Signed)
OFFICE PROGRESS NOTE  CC**  [name], MD [address] 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    PRIOR THERAPY: 1. 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 2 months ago this began getting larger rapidly. She was seen in an urgent care where this mass was thought to be an abscess. Apparently there was an attempt to excise or drain this that was not successful and she was referred to Dr. Dwain Sarna for evaluation. She underwent punch biopsy on 04/23/13 of her skin and the mass. She was then also sent for mm/us. The biopsy shows invasive high grade carcinoma that appears to be breast. It is ER 14%, PR negative, HER-2/neu negative with a Ki-67 that approaches 100%. She has also undergone mm with at least 10 cm upper inner left breast tumor with mild overyling skin thickening. There is also a 3x10 mm cluster of calcs near there also. There is indeterminate 7 mm level 1 left axillary node, she underwent a lymph node biopsy that was positive for disease. No mm evidence of right breast malignancy.  An MRI of the breasts was done on 05/22/13 and showed a 14 cm mass in the left breast protruding from the skin and also a 2.5 cm level II axillary node.   2.  Patient evaluated and began on neoadjuvant chemotherapy.  Adriamycin and Cytoxan began on 05/25/13.    #3 staging PET/CT performed on 06/13/2013 revealed no evidence of distant disease.  #4 anemia of iron deficiency  CURRENT THERAPY: Cycle 3 day 8 of dose dense A/C  INTERVAL HISTORY: Erika [name] 42 y.o. female returns for f/u today.  Clinically patient seems to be doing well. She has noticed a great decrease in her tumor.  She tolerated chemo well this past week.  She does have some mild swelling in her right arm she's worried about.  Otherwise, she denies fevers, chills, nausea,  vomiting, constipation, diarrhea, or any further concerns.   Marland Kitchen MEDICAL HISTORY: Past Medical History  Diagnosis Date  . Breast cancer   . Headache(784.0)     Hx: of Migraines    ALLERGIES:  has No Known Allergies.  MEDICATIONS:  Current Outpatient Prescriptions  Medication Sig Dispense Refill  . acetaminophen (TYLENOL) 500 MG tablet Take 1,000 mg by mouth every 6 (six) hours as needed for pain.      . ciprofloxacin (CIPRO) 500 MG tablet Take 1 tablet (500 mg total) by mouth 2 (two) times daily.  14 tablet  0  . dexamethasone (DECADRON) 4 MG tablet Take 2 daily starting day after chemo x 3 days.  30 tablet  2  . fentaNYL (DURAGESIC - DOSED MCG/HR) 25 MCG/HR Place 1 patch (25 mcg total) onto the skin every 3 (three) days.  5 patch  0  . ibuprofen (ADVIL,MOTRIN) 200 MG tablet Take 200 mg by mouth every 6 (six) hours as needed for pain (600-800 mg prn left breast pain).      Marland Kitchen lidocaine-prilocaine (EMLA) cream Apply topically as needed. Apply to port  1-2 hours before procedure.  30 g  2  . LORazepam (ATIVAN) 0.5 MG tablet Take 1 tablet (0.5 mg total) by mouth every 8 (eight) hours.  30 tablet  1  . ondansetron (ZOFRAN) 8 MG tablet 1 po q12 hours as needed starting 72 hours after chemo.  30  tablet  2  . oxyCODONE (OXY IR/ROXICODONE) 5 MG immediate release tablet Take 1 tablet (5 mg total) by mouth every 4 (four) hours as needed for pain.  30 tablet  0  . oxyCODONE-acetaminophen (ROXICET) 5-325 MG per tablet Take 1 tablet by mouth every 4 (four) hours as needed for pain.  30 tablet  0  . potassium chloride SA (K-DUR,KLOR-CON) 20 MEQ tablet Take 1 tablet (20 mEq total) by mouth 3 (three) times daily.  28 tablet  0  . prochlorperazine (COMPAZINE) 10 MG tablet Take 1 tablet (10 mg total) by mouth every 6 (six) hours as needed.  30 tablet  2  . zolpidem (AMBIEN) 5 MG tablet Take 1 tablet (5 mg total) by mouth at bedtime as needed for sleep.  30 tablet  0   No current facility-administered  medications for this visit.    SURGICAL HISTORY:  Past Surgical History  Procedure Laterality Date  . Tubal ligation    . Portacath placement Right 05/22/2013    Procedure: INSERTION PORT-A-CATH;  Surgeon: Emelia Loron, MD;  Location: [facility] OR;  Service: General;  Laterality: Right;    REVIEW OF SYSTEMS:  General: fatigue (+), night sweats (-), fever (-), pain (+) Lymph: palpable nodes (-) HEENT: vision changes (-), mucositis (-), gum bleeding (-), epistaxis (-) Cardiovascular: chest pain (-), palpitations (-) Pulmonary: shortness of breath (-), dyspnea on exertion (-), cough (-), hemoptysis (-) GI:  Early satiety (-), melena (-), dysphagia (-), nausea/vomiting (-), diarrhea (-) GU: dysuria (-), hematuria (-), incontinence (-) Musculoskeletal: joint swelling (-), joint pain (-), back pain (-) Neuro: weakness (-), numbness (-), headache (-), confusion (-) Skin: Rash (-), lesions (-), dryness (-) Psych: depression (-), suicidal/homicidal ideation (-), feeling of hopelessness (-)    PHYSICAL EXAMINATION: Blood pressure 108/75, pulse 96, temperature 98.7 F (37.1 C), temperature source Oral, resp. rate 20, height 5' 5.5" (1.664 m), weight 218 lb 14.4 oz (99.292 kg), last menstrual period 05/30/2013. Body mass index is 35.86 kg/(m^2). General: Patient is a well appearing female in no acute distress HEENT: PERRLA, sclerae anicteric no conjunctival pallor, MMM Neck: supple, no palpable adenopathy Lungs: clear to auscultation bilaterally, no wheezes, rhonchi, or rales Cardiovascular: regular rate rhythm, S1, S2, no murmurs, rubs or gallops Abdomen: Soft, non-tender, non-distended, normoactive bowel sounds, no HSM Extremities: warm and well perfused, no clubbing, cyanosis, or edema Skin: No rashes or lesions Neuro: Non-focal Breasts: left breast with large mass resolving from protruding from the skin, about 3cm, covered with a bandage ECOG PERFORMANCE STATUS: 1 - Symptomatic but  completely ambulatory  LABORATORY DATA: Lab Results  Component Value Date   WBC 1.0* 06/29/2013   HGB 7.5* 06/29/2013   HCT 24.5* 06/29/2013   MCV 73.4* 06/29/2013   PLT 298 06/29/2013      Chemistry      Component Value Date/Time   NA 140 06/29/2013 1321   NA 139 05/18/2013 1205   K 2.9 Repeated and Verified* 06/29/2013 1321   K 3.8 05/18/2013 1205   CL 102 06/01/2013 1500   CL 103 05/18/2013 1205   CO2 28 06/29/2013 1321   CO2 28 05/18/2013 1205   BUN 6.9* 06/29/2013 1321   BUN 8 05/18/2013 1205   CREATININE 0.7 06/29/2013 1321   CREATININE 0.84 05/18/2013 1205      Component Value Date/Time   CALCIUM 9.5 06/29/2013 1321   CALCIUM 9.1 05/18/2013 1205   ALKPHOS 117 06/29/2013 1321   ALKPHOS 111 05/18/2013 1205  AST 9 06/29/2013 1321   AST 18 05/18/2013 1205   ALT 12 06/29/2013 1321   ALT 16 05/18/2013 1205   BILITOT <0.20 Repeated and Verified 06/29/2013 1321   BILITOT 0.2* 05/18/2013 1205       RADIOGRAPHIC STUDIES:  Chest 2 View  05/18/2013   *RADIOLOGY REPORT*  Clinical Data: Breast carcinoma  CHEST - 2 VIEW  Comparison: None.  Findings: Probable left breast mass. Lungs clear.  Heart size and pulmonary vascularity normal.  No effusion.  Visualized bones unremarkable.  IMPRESSION: No acute disease   Original Report Authenticated By: D. Andria Rhein, MD   US Breast Left  05/08/2013   *RADIOLOGY REPORT*  Clinical Data:  42 year old female with large left breast mass - recent punch biopsy demonstrating high-grade carcinoma.  DIGITAL DIAGNOSTIC BILATERAL MAMMOGRAM WITH CAD AND LEFT BREAST ULTRASOUND:  Comparison:  None.  Findings:  ACR Breast Density Category 2: There is a scattered fibroglandular pattern.  A large irregular mass within the upper inner posterior left breast is identified - measuring at least 10 cm. Mild overlying skin thickening is identified compatible with dermal invasion. A 3 x 10 mm cluster of slightly heterogeneous left breast calcifications approximate 4 cm anterior and lateral to  this mass is identified.  There is no evidence of suspicious mass, distortion or worrisome calcifications within the right breast. Mammographic images were processed with CAD.  On physical exam, a very large protuberant mass within the upper inner left breast is identified.  Ultrasound is performed, showing a large heterogeneous mass within the upper inner left breast at the 10-11 o'clock position, measuring at least 6.5 cm, but evaluation is limited by transducer skin area and protuberant portion of the mass. A 7 mm level I left axillary lymph node is identified with slightly echogenic rim - suspicious.  IMPRESSION: Large biopsy-proven carcinoma within the upper inner left breast, measuring at least 10 cm mammographically. Mild overlying skin thickening compatible with dermal invasion.  3 x 10 mm cluster of indeterminate left breast calcifications 4 cm anterior and lateral to the biopsy-proven neoplasm.  This may represent DCIS and consider tissue sampling.  Indeterminate 7 mm level I left axillary lymph node - metastatic disease not excluded.  No mammographic evidence of right breast malignancy.  BI-RADS CATEGORY 6:  Known biopsy-proven malignancy - appropriate action should be taken.  RECOMMENDATION: Treatment plan.  Consider biopsies of the indeterminate left axillary lymph node and left breast calcifications as clinically indicated.  Consider bilateral breast MRI as clinically indicated.  I have discussed the findings and recommendations with the patient. Results were also provided in writing at the conclusion of the visit.   Original Report Authenticated By: Harmon Pier, M.D.   Mr Breast Bilateral W Wo Contrast  05/22/2013   *RADIOLOGY REPORT*  Clinical Data: large invasive carcinoma left upper inner quadrant with dermal involvement  BILATERAL BREAST MRI WITH AND WITHOUT CONTRAST  Technique: Multiplanar, multisequence MR images of both breasts were obtained prior to and following the intravenous  administration of 20ml of Multihance.  Three dimensional images were evaluated at the independent DynaCad workstation.  Comparison:  05/08/13  Findings: There is moderate background parenchymal enhancement. The right breast and axilla are negative.  On the left, there is a large irregular mass with irregular borders in the upper inner quadrant, demonstrating heterogenous internal enhancement.  The mass measures 14 (AP x 7.5 (lateral) x 8 (craniocaudal)cm.  It causes protrusion of the skin of the breast in the 10 -  11 o'clock position, and it is associated with edematous change in the skin of the left breast, primarily in the upper inner quadrant but also in the periareolar in more infra- areolar left breast.  The mass extends through pectoralis major musculature posteriorly, with enhancing mass extending all the way to the posterior edge of the muscle.  In the 2 o'clock position posteriorly there is a 9mm oval enhancing mass with a mixture of plateau and washout kinetics; it is an indeterminate finding but shows the suggestion of a fatty hilum, and it may be an intramammary lymph node.  In the left axilla, there is an enlarged level II lymph node demonstrating hypervascularity and cortical thickening.  It measures 2.5cm in greatest dimension, and the appearance is concerning for metastasis.  There are no other abnormalities on the left.  IMPRESSION: Large left breast malignancy associated with involvement of the dermis and the chest wall, as well as with probable axillary lymph node metastasis.  There is an indeterminate 9mm mass in the 2 o'clock position, that may be an intramammary lymph node.  BI-RADS CATEGORY 6:  Known biopsy-proven malignancy - appropriate action should be taken.  THREE-DIMENSIONAL MR IMAGE RENDERING ON INDEPENDENT WORKSTATION:  Three-dimensional MR images were rendered by post-processing of the original MR data on an independent workstation.  The three- dimensional MR images were interpreted,  and findings were reported in the accompanying complete MRI report for this study.   Original Report Authenticated By: Esperanza Heir, M.D.   Dg Chest Port 1 View  05/22/2013   *RADIOLOGY REPORT*  Clinical Data: Port-A-Cath placement.  PORTABLE CHEST - 1 VIEW  Comparison: 05/18/2013  Findings: Right-sided power injectable Port-A-Cath noted with distal tip projecting over the SVC.  Low lung volumes are present, causing crowding of the pulmonary vasculature.  No pneumothorax noted.  Bilateral midlung subsegmental atelectasis noted.  Borderline cardiomegaly.  IMPRESSION:  1.  Power injectable catheter tip:  SVC.  No pneumothorax.   Original Report Authenticated By: Gaylyn Rong, M.D.   Mm Digital Diagnostic Bilat  05/08/2013   *RADIOLOGY REPORT*  Clinical Data:  42 year old female with large left breast mass - recent punch biopsy demonstrating high-grade carcinoma.  DIGITAL DIAGNOSTIC BILATERAL MAMMOGRAM WITH CAD AND LEFT BREAST ULTRASOUND:  Comparison:  None.  Findings:  ACR Breast Density Category 2: There is a scattered fibroglandular pattern.  A large irregular mass within the upper inner posterior left breast is identified - measuring at least 10 cm. Mild overlying skin thickening is identified compatible with dermal invasion. A 3 x 10 mm cluster of slightly heterogeneous left breast calcifications approximate 4 cm anterior and lateral to this mass is identified.  There is no evidence of suspicious mass, distortion or worrisome calcifications within the right breast. Mammographic images were processed with CAD.  On physical exam, a very large protuberant mass within the upper inner left breast is identified.  Ultrasound is performed, showing a large heterogeneous mass within the upper inner left breast at the 10-11 o'clock position, measuring at least 6.5 cm, but evaluation is limited by transducer skin area and protuberant portion of the mass. A 7 mm level I left axillary lymph node is identified with  slightly echogenic rim - suspicious.  IMPRESSION: Large biopsy-proven carcinoma within the upper inner left breast, measuring at least 10 cm mammographically. Mild overlying skin thickening compatible with dermal invasion.  3 x 10 mm cluster of indeterminate left breast calcifications 4 cm anterior and lateral to the biopsy-proven neoplasm.  This may  represent DCIS and consider tissue sampling.  Indeterminate 7 mm level I left axillary lymph node - metastatic disease not excluded.  No mammographic evidence of right breast malignancy.  BI-RADS CATEGORY 6:  Known biopsy-proven malignancy - appropriate action should be taken.  RECOMMENDATION: Treatment plan.  Consider biopsies of the indeterminate left axillary lymph node and left breast calcifications as clinically indicated.  Consider bilateral breast MRI as clinically indicated.  I have discussed the findings and recommendations with the patient. Results were also provided in writing at the conclusion of the visit.   Original Report Authenticated By: Harmon Pier, M.D.   Dg Fluoro Guide Cv Line-no Report  05/22/2013   CLINICAL DATA: surgery   FLOURO GUIDE CV LINE  Fluoroscopy was utilized by the requesting physician.  No radiographic  interpretation.    [place] Lt Breast Bx W Loc Dev 1st Lesion Img Bx Spec US Guide  05/22/2013   **ADDENDUM** CREATED: 05/22/2013 11:12:58  The patient was called by myself, Dr. Micheline Maze today.  The patient states she is doing well since her left axillary node biopsy without problems at the biopsy site.  The patient was given the results of her biopsy which was compatible with metastatic invasive mammary carcinoma.  This is concordant with imaging findings and patient's known history of recent positive biopsy of a large inner upper left breast mass demonstrating invasive mammary carcinoma. The patient had her breast MRI this morning and will receive Port-A- Cath placement by Dr. Dwain Sarna this afternoon.  Recommend follow- up pending MRI  results and per treatment plan.  **END ADDENDUM** SIGNED BY: Melton Alar. Micheline Maze, M.D.  05/21/2013   *RADIOLOGY REPORT*  Clinical Data:  New diagnosis of invasive mammary carcinoma in the left upper inner quadrant.  Abnormal lymph node noted on ultrasound dated 05/08/2013.  ULTRASOUND GUIDED CORE BIOPSY OF THE LEFT AXILLA  The patient and I discussed the procedure of ultrasound-guided biopsy, including benefits and alternatives.  We discussed the high likelihood of a successful procedure. We discussed the risks of the procedure, including infection, bleeding, tissue injury, clip migration, and inadequate sampling.  Written informed consent was given. Appropriate time-out was performed.  Using sterile technique, 2% lidocaine, ultrasound guidance, and a 14 gauge automated biopsy device, biopsy was performed of the abnormal left axillary lymph node using a lateromedial approach. At the conclusion of the procedure, a dumbbell shaped tissue marker clip was deployed into the biopsy cavity. Post biopsy mammogram was not performed.  IMPRESSION: Ultrasound guided biopsy of an abnormal left axillary lymph node. No apparent complications.  Original Report Authenticated By: Cain Saupe, M.D.    ASSESSMENT:   1. Patient is a 42 year old female with high grade invasive breast carcinoma ER 14%, PR negative, HER-2/neu negative.  She was evaluated by Dr. Dwain Sarna.  She will undergo neoadjuvant chemotherapy with dose dense Adriamycin/Cytoxan x 4 cycles followed by weekly Taxol/Carbo x 12 weeks.  She has underwent port placement and chemotherapy class.    #2 patient had CT and PET scan performed. She has no evidence of metastatic disease distantly.  #3 she was begun on neoadjuvant chemotherapy consisting of Adriamycin Cytoxan given Q2 weekly beginning on 06/08/2013. Overall she is tolerating this well.  #4 anemia due to menorrhagia. She is now status post therapy heme given on 06/13/2013.  #5 pain  #6  insomnia   PLAN:  1. Patient is subsequently neutropenic after chemotherapy.  She will take Cipro BID.  I gave her neutropenic instructions and reviewed them  with her in detail .  She will take Kdur TID until her appointment next week.    We discussed her anemia and symptoms of worsening anemia for her to contact us about.    For the right arm swelling she will have a doppler to r/o dvt.    She will return in 1 week for labs, appt, and chemotherapy.     All questions were answered. The patient knows to call the clinic with any problems, questions or concerns. We can certainly see the patient much sooner if necessary.  I spent 25 minutes counseling the patient face to face. The total time spent in the appointment was 30 minutes.  Cherie Ouch Lyn Hollingshead, NP Medical Oncology [facility] Phone: [phone]   06/29/2013, 3:04 PM

## 2013-06-29 NOTE — Progress Notes (Addendum)
*  Preliminary Results* Right upper extremity venous duplex completed. Right upper extremity is positive for deep and superficial vein thrombosis surrounding intravenous line involving the right axillary and basilic veins as well as the right brachial and ulnar veins.  Preliminary results discussed with Dr.Granfortuna, the physician on call, and he has advised the patient to go to the emergency department for treatment.   06/29/2013 5:56 PM  Gertie Fey, RVT, RDCS, RDMS

## 2013-06-29 NOTE — Discharge Instructions (Signed)
Deep Vein Thrombosis A deep vein thrombosis (DVT) is a blood clot that develops in a deep vein. A DVT is a clot in the deep, larger veins of the leg, arm, or pelvis. These are more dangerous than clots that might form in veins near the surface of the body. A DVT can lead to complications if the clot breaks off and travels in the bloodstream to the lungs.  A DVT can damage the valves in your leg veins, so that instead of flowing upwards, the blood pools in the lower leg. This is called post-thrombotic syndrome, and can result in pain, swelling, discoloration, and sores on the leg. Once identified, a DVT can be treated. It can also be prevented in some circumstances. Once you have had a DVT, you may be at increased risk for a DVT in the future. CAUSES Blood clots form in a vein for different reasons. Usually several things contribute to blood clots. Contributing factors include:  The flow of blood slows down.  The inside of the vein is damaged in some way.  The person has a condition that makes blood clot more easily. Some people are more likely than others to develop blood clots. That is because they have more factors that make clots likely. These are called risk factors. Risk factors include:   Older age, especially over 60 years old.  Having a history of blood clots. This means you have had one before. Or, it means that someone else in your family has had blood clots. You may have a genetic tendency to form clots.  Having major or lengthy surgery. This is especially true for surgery on the hip, knee, or belly (abdomen). Hip surgery is particularly high risk.  Breaking a hip or leg.  Sitting or lying still for a long time. This includes long distance travel, paralysis, or recovery from an illness or surgery.  Cancer, or cancer treatment.  Having a long, thin tube (catheter) placed inside a vein during a medical procedure.  Being overweight (obese).  Pregnancy and childbirth. Hormone  changes make the blood clot more easily during pregnancy. The fetus puts pressure on the veins of the pelvis. There is also risk of injury to veins during delivery or a caesarean. The risk is at its highest just after childbirth.  Medicines with the female hormone estrogen. This includes birth control pills and hormone replacement therapy.  Smoking.  Other circulation or heart problems. SYMPTOMS When a clot forms, it can either partially or totally block the blood flow in that vein. Symptoms of a DVT can include:  Swelling of the leg or arm, especially if one side is much worse.  Warmth and redness of the leg or arm, especially if one side is much worse.  Pain in an arm or leg. If the clot is in the leg, symptoms may be more noticeable or worse when standing or walking. The symptoms of a DVT that has traveled to the lungs (pulmonary embolism, PE) usually start suddenly, and include:  Shortness of breath.  Coughing.  Coughing up blood or blood-tinged phlegm.  Chest pain. The chest pain is often worse with deep breaths.  Rapid heartbeat. Anyone with these symptoms should get emergency medical treatment right away. Call your local emergency services (911 in [place] if you have these symptoms. DIAGNOSIS If a DVT is suspected, your caregiver will take a full medical history and carry out a physical exam. Tests that also may be required include:  Blood tests, including studies of  the clotting properties of the blood.  Ultrasonography to see if you have clots in your legs or lungs.  X-rays to show the flow of blood when dye is injected into the veins (venography).  Studies of your lungs, if you have any chest symptoms. PREVENTION  Exercise the legs regularly. Take a brisk 30 minute walk every day.  Maintain a weight that is appropriate for your height.  Avoid sitting or lying in bed for long periods of time without moving your legs.  Women, particularly those over the age of 5,  should consider the risks and benefits of taking estrogen medicines, including birth control pills.  Do not smoke, especially if you take estrogen medicines.  Long distance travel can increase your risk of DVT. You should exercise your legs by walking or pumping the muscles every hour.  In-hospital prevention:  Many of the risk factors above relate to situations that exist with hospitalization, either for illness, injury, or elective surgery.  Your caregiver will assess you for the need for venous thromboembolism prophylaxis when you are admitted to the hospital. If you are having surgery, your surgeon will assess you the day of or day after surgery.  Prevention may include medical and nonmedical measures. TREATMENT Treatment for DVT helps prevent death and disability. The most common treatment for DVT is blood thinning (anticoagulant) medicine, which reduces the blood's tendency to clot. Anticoagulants can stop new blood clots from forming and old ones from growing. They cannot dissolve existing clots. Your body does this by itself over time. Anticoagulants can be given by mouth, by intravenous (IV) access, or by injection. Your caregiver will determine the best program for you.  Heparin or related medicines (low molecular weight heparin) are usually the first treatment for a blood clot. They act quickly. However, they cannot be taken orally.  Heparin can cause a fall in a component of blood that stops bleeding and forms blood clots (platelets). You will be monitored with blood tests to be sure this does not occur.  Warfarin is an anticoagulant that can be swallowed (taken orally). It takes a few days to start working, so usually heparin or related medicines are used in combination. Once warfarin is working, heparin is usually stopped.  Less commonly, clot dissolving drugs (thrombolytics) are used to dissolve a DVT. They carry a high risk of bleeding, so they are used mainly in severe cases,  where a life or limb is threatened.  Very rarely, a blood clot in the leg needs to be removed surgically.  If you are unable to take anticoagulants, your caregiver may arrange for you to have a filter placed in a main vein in your belly (abdomen). This filter prevents clots from traveling to your lungs. HOME CARE INSTRUCTIONS  Take all medicines prescribed by your caregiver. Follow the directions carefully.  Warfarin. Most people will continue taking warfarin after hospital discharge. Your caregiver will advise you on the length of treatment (usually 3 6 months, sometimes lifelong).  Too much and too little warfarin are both dangerous. Too much warfarin increases the risk of bleeding. Too little warfarin continues to allow the risk for blood clots. While taking warfarin, you will need to have regular blood tests to measure your blood clotting time. These blood tests usually include both the prothrombin time (PT) and international normalized ratio (INR) tests. The PT and INR results allow your caregiver to adjust your dose of warfarin. The dose can change for many reasons. It is critically important that  you take warfarin exactly as prescribed, and that you have your PT and INR levels drawn exactly as directed.  Many foods, especially foods high in vitamin K can interfere with warfarin and affect the PT and INR results. Foods high in vitamin K include spinach, kale, broccoli, cabbage, collard and turnip greens, brussels sprouts, peas, cauliflower, seaweed, and parsley as well as beef and pork liver, green tea, and soybean oil. You should eat a consistent amount of foods high in vitamin K. Avoid major changes in your diet, or notify your caregiver before changing your diet. Arrange a visit with a dietitian to answer your questions.  Many medicines can interfere with warfarin and affect the PT and INR results. You must tell your caregiver about any and all medicines you take, this includes all vitamins  and supplements. Be especially cautious with aspirin and anti-inflammatory medicines. Ask your caregiver before taking these. Do not take or discontinue any prescribed or over-the-counter medicine except on the advice of your caregiver or pharmacist.  Warfarin can have side effects, primarily excessive bruising or bleeding. You will need to hold pressure over cuts for longer than usual. Your caregiver or pharmacist will discuss other potential side effects.  Alcohol can change the body's ability to handle warfarin. It is best to avoid alcoholic drinks or consume only very small amounts while taking warfarin. Notify your caregiver if you change your alcohol intake.  Notify your dentist or other caregivers before procedures.  Activity. Ask your caregiver how soon you can go back to normal activities. It is important to stay active to prevent blood clots. If you are on anticoagulant medicine, avoid contact sports.  Exercise. It is very important to exercise. This is especially important while traveling, sitting or standing for long periods of time. Exercise your legs by walking or by pumping the muscles frequently. Take frequent walks.  Compression stockings. These are tight elastic stockings that apply pressure to the lower legs. This pressure can help keep the blood in the legs from clotting. You may need to wear compressions stockings at home to help prevent a DVT.  Smoking. If you smoke, quit. Ask your caregiver for help with quitting smoking.  Learn as much as you can about DVT. Knowing more about the condition should help you keep it from coming back.  Wear a medical alert bracelet or carry a medical alert card. SEEK MEDICAL CARE IF:  You notice a rapid heartbeat.  You feel weaker or more tired than usual.  You feel faint.  You notice increased bruising.  You feel your symptoms are not getting better in the time expected.  You believe you are having side effects of medicine. SEEK  IMMEDIATE MEDICAL CARE IF:  You have chest pain.  You have trouble breathing.  You have new or increased swelling or pain in one leg.  You cough up blood.  You notice blood in vomit, in a bowel movement, or in urine. MAKE SURE YOU:  Understand these instructions.  Will watch your condition.  Will get help right away if you are not doing well or get worse. Document Released: 11/29/2005 Document Revised: 08/23/2012 Document Reviewed: 01/21/2011 [facility] Patient Information 2014 [place], [place].   Enoxaparin injection What is this medicine? ENOXAPARIN (ee nox a PA rin) is used after knee, hip, or abdominal surgeries to prevent blood clotting. It is also used to treat existing blood clots in the lungs or in the veins. This medicine may be used for other purposes; ask your  health care provider or pharmacist if you have questions. What should I tell my health care provider before I take this medicine? They need to know if you have any of these conditions: -bleeding disorders, hemorrhage, or hemophilia -infection of the heart or heart valves -kidney or liver disease -previous stroke -prosthetic heart valve -recent surgery or delivery of a baby -ulcer in the stomach or intestine, diverticulitis, or other bowel disease -an unusual or allergic reaction to enoxaparin, heparin, pork or pork products, other medicines, foods, dyes, or preservatives -pregnant or trying to get pregnant -breast-feeding How should I use this medicine? This medicine is for injection under the skin. It is usually given by a health-care professional. You or a family member may be trained on how to give the injections. If you are to give yourself injections, make sure you understand how to use the syringe, measure the dose if necessary, and give the injection. To avoid bruising, do not rub the site where this medicine has been injected. Do not take your medicine more often than directed. Do not stop taking except on  the advice of your doctor or health care professional. Make sure you receive a puncture-resistant container to dispose of the needles and syringes once you have finished with them. Do not reuse these items. Return the container to your doctor or health care professional for proper disposal. Talk to your pediatrician regarding the use of this medicine in children. Special care may be needed. Overdosage: If you think you have taken too much of this medicine contact a poison control center or emergency room at once. NOTE: This medicine is only for you. Do not share this medicine with others. What if I miss a dose? If you miss a dose, take it as soon as you can. If it is almost time for your next dose, take only that dose. Do not take double or extra doses. What may interact with this medicine? Do not take this medicine with any of the following medications: -aspirin and aspirin-like medicines -heparin -mifepristone -warfarin This medicine may also interact with the following medications: -cilostazol -clopidogrel -dipyridamole -NSAIDs, medicines for pain and inflammation, like ibuprofen or naproxen -sulfinpyrazone -ticlopidine This list may not describe all possible interactions. Give your health care provider a list of all the medicines, herbs, non-prescription drugs, or dietary supplements you use. Also tell them if you smoke, drink alcohol, or use illegal drugs. Some items may interact with your medicine. What should I watch for while using this medicine? Visit your doctor or health care professional for regular checks on your progress. Your condition will be monitored carefully while you are receiving this medicine. Contact your doctor or health care professional and seek emergency treatment if you develop increased difficulty in breathing, chest pain, dizziness, shortness of breath, swelling in the legs or arms, abdominal pain, decreased vision, pain when walking, or pain and warmth of the  arms or legs. These can be signs that your condition has gotten worse. Monitor your skin closely for easy bruising or red spots, which can be signs of bleeding. If you notice easy bruising or minor bleeding from the nose, gums/teeth, in your urine, or stool, contact your doctor or health care professional right away. The dose of your medicine may need to be changed. If you are going to have surgery, tell your doctor or health care professional that you are taking this medicine. Try to avoid injury while you are using this medicine. Be careful when brushing or flossing your  teeth, shaving, cutting your fingernails or toenails, or when using sharp objects. Report any injuries to your doctor or health care professional. What side effects may I notice from receiving this medicine? Side effects that you should report to your doctor or health care professional as soon as possible: -allergic reactions like skin rash, itching or hives, swelling of the face, lips, or tongue -black, tarry stools -breathing problems -dark urine -feeling faint or lightheaded, falls -fever -heavy menstrual bleeding -unusual bruising or bleeding Side effects that usually do not require medical attention (report to your doctor or health care professional if they continue or are bothersome): -pain or irritation at the injection site This list may not describe all possible side effects. Call your doctor for medical advice about side effects. You may report side effects to FDA at 1-800-FDA-1088. Where should I keep my medicine? Keep out of the reach of children. Store at room temperature between 15 and 30 degrees C (59 and 86 degrees F). Do not freeze. If your injections have been specially prepared, you may need to store them in the refrigerator. Ask your pharmacist. Throw away any unused medicine after the expiration date. NOTE: This sheet is a summary. It may not cover all possible information. If you have questions about this  medicine, talk to your doctor, pharmacist, or health care provider.  2013, [employer]. (02/09/2008 4:47:37 PM)

## 2013-06-29 NOTE — ED Provider Notes (Signed)
History    CSN: [id number] Arrival date & time 06/29/13  1816  First MD Initiated Contact with Patient 06/29/13 1925     Chief Complaint  Patient presents with  . DVT   HPI Per pt sts 2 days of knot in her right arm. Pt sent here for positive blood clot in right upper extremity.  Patient denies shortness of breath.  Past Medical History  Diagnosis Date  . Breast cancer   . Headache(784.0)     Hx: of Migraines   Past Surgical History  Procedure Laterality Date  . Tubal ligation    . Portacath placement Right 05/22/2013    Procedure: INSERTION PORT-A-CATH;  Surgeon: Emelia Loron, MD;  Location: [facility] OR;  Service: General;  Laterality: Right;   Family History  Problem Relation Age of Onset  . Hypertension Mother   . Diabetes Father   . Cancer Maternal Aunt     stomach   History  Substance Use Topics  . Smoking status: Never Smoker   . Smokeless tobacco: Never Used  . Alcohol Use: No   OB History   Grav Para Term Preterm Abortions TAB SAB Ect Mult Living                 Review of Systems All other systems reviewed and are negative Allergies  Review of patient's allergies indicates no known allergies.  Home Medications   Current Outpatient Rx  Name  Route  Sig  Dispense  Refill  . dexamethasone (DECADRON) 4 MG tablet   Oral   Take 8 mg by mouth See admin instructions. Take daily for 3 days starting day after chemo         . fentaNYL (DURAGESIC - DOSED MCG/HR) 25 MCG/HR   Transdermal   Place 1 patch (25 mcg total) onto the skin every 3 (three) days.   5 patch   0   . HYDROcodone-acetaminophen (NORCO/VICODIN) 5-325 MG per tablet   Oral   Take 2 tablets by mouth every 8 (eight) hours as needed for pain.          Marland Kitchen ibuprofen (ADVIL,MOTRIN) 200 MG tablet   Oral   Take 600-800 mg by mouth every 4 (four) hours as needed for pain.          Marland Kitchen lidocaine-prilocaine (EMLA) cream   Topical   Apply 1 application topically every 14 (fourteen) days. Every  other Friday prior to chemo         . LORazepam (ATIVAN) 0.5 MG tablet   Oral   Take 0.5 mg by mouth daily as needed (for nausea on chemo days).          . ondansetron (ZOFRAN) 8 MG tablet   Oral   Take 8 mg by mouth 2 (two) times daily as needed for nausea. Start 72 hours after chemo         . oxyCODONE (OXY IR/ROXICODONE) 5 MG immediate release tablet   Oral   Take 1 tablet (5 mg total) by mouth every 4 (four) hours as needed for pain.   30 tablet   0   . prochlorperazine (COMPAZINE) 10 MG tablet   Oral   Take 10 mg by mouth every 6 (six) hours as needed (nausea).         . zolpidem (AMBIEN) 5 MG tablet   Oral   Take 1 tablet (5 mg total) by mouth at bedtime as needed for sleep.   30 tablet   0   .  ciprofloxacin (CIPRO) 500 MG tablet   Oral   Take 500 mg by mouth 2 (two) times daily. #14 filled 06/29/13         . potassium chloride SA (K-DUR,KLOR-CON) 20 MEQ tablet   Oral   Take 1 tablet (20 mEq total) by mouth 3 (three) times daily.   28 tablet   0    BP 108/79  Pulse 89  Temp(Src) 98 F (36.7 C) (Oral)  Resp 16  Ht 5' 5.5" (1.664 m)  Wt 218 lb (98.884 kg)  BMI 35.71 kg/m2  SpO2 98%  LMP 05/30/2013 Physical Exam  Nursing note and vitals reviewed. Constitutional: She is oriented to person, place, and time. She appears well-developed and well-nourished. No distress.  HENT:  Head: Normocephalic and atraumatic.  Eyes: Pupils are equal, round, and reactive to light.  Neck: Normal range of motion.  Cardiovascular: Normal rate and intact distal pulses.   Pulmonary/Chest: No respiratory distress.  Abdominal: Normal appearance. She exhibits no distension.  Musculoskeletal: Normal range of motion. She exhibits edema (Right upper extremity).  Neurological: She is alert and oriented to person, place, and time. No cranial nerve deficit.  Skin: Skin is warm and dry. No rash noted.  Psychiatric: She has a normal mood and affect. Her behavior is normal.     ED Course  Procedures (including critical care time)  Medications  enoxaparin (LOVENOX) injection 100 mg (100 mg Subcutaneous Given 06/29/13 2117)  enoxaparin (LOVENOX) patient education kit (not administered)    Labs Reviewed - No data to display No results found. 1. DVT (deep venous thrombosis), right     MDM  Discuss case with oncology who recommended patient go home with Lovenox outpatient treatment.  They will follow her in the office.  Patient educated about self Lovenox treatment.  Nelia Shi, MD 06/29/13 2217

## 2013-06-29 NOTE — Progress Notes (Addendum)
ANTICOAGULATION CONSULT NOTE - Initial Consult  Pharmacy Consult for Lovenox Indication: DVT  No Known Allergies  Patient Measurements: Height: 5' 5.5" (166.4 cm) Weight: 218 lb (98.884 kg) IBW/kg (Calculated) : 58.15  Vital Signs: Temp: 98 F (36.7 C) (07/18 2041) Temp src: Oral (07/18 2041) BP: 108/79 mmHg (07/18 2041) Pulse Rate: 89 (07/18 2041)  Labs:  Recent Labs  06/29/13 1321  HGB 7.5*  HCT 24.5*  PLT 298  CREATININE 0.7    Estimated Creatinine Clearance: 107.7 ml/min (by C-G formula based on Cr of 0.7).   Medical History: Past Medical History  Diagnosis Date  . Breast cancer   . Headache(784.0)     Hx: of Migraines    Medications:  See electronic med rec  Assessment: 42 y.o. female presents with DVT in RUE - seen on U/S. Pt to begin lovenox. CrCl > 80 ml/min.  Goal of Therapy:  Anti-Xa level 0.6-1.2 units/ml 4hrs after LMWH dose given Monitor platelets by anticoagulation protocol: Yes   Plan:  1. Lovenox 100mg  SQ q12h. First dose now. 2. Will f/u if pt d/c with follow-up tomorrow or if pt admitted  3. Will f/u start of oral anticoagulation  Christoper Fabian, PharmD, BCPS Clinical pharmacist, pager [phone] 06/29/2013,9:10 PM  Addendum 2215 Pt to go home on lovenox and f/u with oncologist in next few days. To go home on Lovenox 150mg  SQ q24h.  Christoper Fabian, PharmD, BCPS Clinical pharmacist, pager [phone] 06/29/2013  10:12 PM

## 2013-06-30 ENCOUNTER — Other Ambulatory Visit: Payer: Self-pay | Admitting: Hematology & Oncology

## 2013-06-30 ENCOUNTER — Ambulatory Visit (HOSPITAL_BASED_OUTPATIENT_CLINIC_OR_DEPARTMENT_OTHER): Payer: No Typology Code available for payment source

## 2013-06-30 ENCOUNTER — Telehealth: Payer: Self-pay | Admitting: Internal Medicine

## 2013-06-30 VITALS — BP 124/84 | HR 83 | Temp 97.4°F | Resp 20

## 2013-06-30 DIAGNOSIS — I82622 Acute embolism and thrombosis of deep veins of left upper extremity: Secondary | ICD-10-CM

## 2013-06-30 DIAGNOSIS — M7989 Other specified soft tissue disorders: Secondary | ICD-10-CM

## 2013-06-30 DIAGNOSIS — C773 Secondary and unspecified malignant neoplasm of axilla and upper limb lymph nodes: Secondary | ICD-10-CM

## 2013-06-30 DIAGNOSIS — C50219 Malignant neoplasm of upper-inner quadrant of unspecified female breast: Secondary | ICD-10-CM

## 2013-06-30 MED ORDER — RIVAROXABAN 15 MG PO TABS: 15.0000 mg | ORAL_TABLET | Freq: Two times a day (BID) | ORAL | Status: DC

## 2013-06-30 MED ORDER — ENOXAPARIN SODIUM 150 MG/ML ~~LOC~~ SOLN
150.0000 mg | Freq: Once | SUBCUTANEOUS | Status: AC
  Administered 2013-06-30: 150 mg via SUBCUTANEOUS

## 2013-06-30 NOTE — Telephone Encounter (Signed)
Patient called at 1:25 am this morning to report that she went to the pharmacy to pick up her lovenox but the co-pay was $154 and she could not afford it.   She requests another more affordable option.  She will call to discuss further this am.  She was discharged yesterday late evening with upper extremity dvt.   Her next dose of lovenox is due at 10am this morning.   Plan: Will discuss with primary oncologist in am.

## 2013-06-30 NOTE — Progress Notes (Signed)
I was called by the patient and she has DVT in the RIGHT arm.  This is related to the port.  Went to ER and sent home on Lovenox.  She cannot afford Lovenox.  I told her to go to the Clinic today for Lovenox.  I spoke to [facility] at the [facility] and she will give her the Lovenox.  The dose is 150mg .  I sent a "script for Xarelto to her pharmacy. Hopefully, she can afford this.  She will take 15mg  PO BID x 21 days, then 20mg  PO qd.  I did NOT sent the 'script for the 20mg  dose.  If she cannot afford the Xarelto, she will need to come to Clinic on Monday for Lovenox injections.  I think that she can miss one day of the Lovenox if she cannot afford the Xarelto.  She understands this and will come to the Clinic today for injection. She will go to the pharmacy and see if Xarelto will be affordable.  Lawerance Sabal 1:5-7

## 2013-07-02 ENCOUNTER — Encounter: Payer: Self-pay | Admitting: Oncology

## 2013-07-02 ENCOUNTER — Telehealth: Payer: Self-pay | Admitting: *Deleted

## 2013-07-02 ENCOUNTER — Telehealth: Payer: Self-pay | Admitting: Medical Oncology

## 2013-07-02 MED ORDER — ENOXAPARIN SODIUM 100 MG/ML ~~LOC~~ SOLN: 150.0000 mg | Freq: Every day | SUBCUTANEOUS | Status: DC

## 2013-07-02 NOTE — Telephone Encounter (Signed)
F/U with patient regarding lovenox injection, informed patient that per NP will transfer prescription to [facility] outpatient pharmacy and informed [employer], Raquel Browning to f/u with patient for financial assistance. Patient verbalized understanding, no further questions at this time.

## 2013-07-02 NOTE — Telephone Encounter (Signed)
Patient LVMOM stating she was in ED 07/18 regarding DVT to RUE and was given prescription for Lovenox, states she cannot afford this and asking for alternative.  LOV with NP 07/18 appt sched for 07/06/13 lab/NP

## 2013-07-02 NOTE — Progress Notes (Signed)
Patient came in and signed grant forms for 600.00 and 400.00. I had called and explained all to her. She bought in bank statement. She knows to go to [facility] OP for meds.

## 2013-07-02 NOTE — Telephone Encounter (Signed)
Spoke with Elnita Maxwell, RPh in pharmacy: Will fill quantity of #14 Lovenox 150 mg syringes for now. Will be seen in office 7/25 and at that time can determine if she will continue Lovenox or be converted to oral anticoagulation. DVT was diagnosed in emergency room, not in clinic.

## 2013-07-02 NOTE — Telephone Encounter (Signed)
Patient called to report she will not be able to afford to give herself the Lovenox injections at home or to afford the Xarelto tablets that were called in for her. 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 Spoke with Dr. Milta Deiters nurse and she is currently working on the situation.

## 2013-07-06 ENCOUNTER — Other Ambulatory Visit (HOSPITAL_BASED_OUTPATIENT_CLINIC_OR_DEPARTMENT_OTHER): Payer: No Typology Code available for payment source | Admitting: Lab

## 2013-07-06 ENCOUNTER — Telehealth: Payer: Self-pay | Admitting: *Deleted

## 2013-07-06 ENCOUNTER — Ambulatory Visit (HOSPITAL_BASED_OUTPATIENT_CLINIC_OR_DEPARTMENT_OTHER): Payer: No Typology Code available for payment source | Admitting: Adult Health

## 2013-07-06 ENCOUNTER — Ambulatory Visit (HOSPITAL_BASED_OUTPATIENT_CLINIC_OR_DEPARTMENT_OTHER): Payer: No Typology Code available for payment source

## 2013-07-06 ENCOUNTER — Encounter: Payer: Self-pay | Admitting: Adult Health

## 2013-07-06 VITALS — BP 108/75 | HR 84 | Temp 98.5°F | Resp 20 | Ht 65.5 in | Wt 224.7 lb

## 2013-07-06 DIAGNOSIS — C50212 Malignant neoplasm of upper-inner quadrant of left female breast: Secondary | ICD-10-CM

## 2013-07-06 DIAGNOSIS — R209 Unspecified disturbances of skin sensation: Secondary | ICD-10-CM

## 2013-07-06 DIAGNOSIS — N920 Excessive and frequent menstruation with regular cycle: Secondary | ICD-10-CM

## 2013-07-06 DIAGNOSIS — C50919 Malignant neoplasm of unspecified site of unspecified female breast: Secondary | ICD-10-CM

## 2013-07-06 DIAGNOSIS — C50219 Malignant neoplasm of upper-inner quadrant of unspecified female breast: Secondary | ICD-10-CM

## 2013-07-06 DIAGNOSIS — N644 Mastodynia: Secondary | ICD-10-CM

## 2013-07-06 DIAGNOSIS — Z5111 Encounter for antineoplastic chemotherapy: Secondary | ICD-10-CM

## 2013-07-06 DIAGNOSIS — D509 Iron deficiency anemia, unspecified: Secondary | ICD-10-CM

## 2013-07-06 LAB — CBC WITH DIFFERENTIAL/PLATELET
BASO%: 0.3 % (ref 0.0–2.0)
Eosinophils Absolute: 0 10*3/uL (ref 0.0–0.5)
HCT: 25.9 % — ABNORMAL LOW (ref 34.8–46.6)
HGB: 8.4 g/dL — ABNORMAL LOW (ref 11.6–15.9)
LYMPH%: 11.7 % — ABNORMAL LOW (ref 14.0–49.7)
MONO#: 0.6 10*3/uL (ref 0.1–0.9)
NEUT#: 6.5 10*3/uL (ref 1.5–6.5)
NEUT%: 80.3 % — ABNORMAL HIGH (ref 38.4–76.8)
Platelets: 341 10*3/uL (ref 145–400)
WBC: 8.2 10*3/uL (ref 3.9–10.3)
lymph#: 1 10*3/uL (ref 0.9–3.3)

## 2013-07-06 LAB — COMPREHENSIVE METABOLIC PANEL (CC13)
ALT: 12 U/L (ref 0–55)
CO2: 26 mEq/L (ref 22–29)
Calcium: 9.3 mg/dL (ref 8.4–10.4)
Chloride: 108 mEq/L (ref 98–109)
Creatinine: 0.8 mg/dL (ref 0.6–1.1)
Glucose: 94 mg/dl (ref 70–140)
Total Bilirubin: <0.2 mg/dL (ref 0.20–1.20)
Total Protein: 7 g/dL (ref 6.4–8.3)

## 2013-07-06 MED ORDER — HYDROCODONE-ACETAMINOPHEN 5-325 MG PO TABS: 2.0000 | ORAL_TABLET | Freq: Three times a day (TID) | ORAL | Status: DC | PRN

## 2013-07-06 MED ORDER — DEXAMETHASONE SODIUM PHOSPHATE 20 MG/5ML IJ SOLN
12.0000 mg | Freq: Once | INTRAMUSCULAR | Status: AC
  Administered 2013-07-06: 12 mg via INTRAVENOUS

## 2013-07-06 MED ORDER — SODIUM CHLORIDE 0.9 % IV SOLN
600.0000 mg/m2 | Freq: Once | INTRAVENOUS | Status: AC
  Administered 2013-07-06: 1320 mg via INTRAVENOUS
  Filled 2013-07-06: qty 66

## 2013-07-06 MED ORDER — SODIUM CHLORIDE 0.9 % IV SOLN
150.0000 mg | Freq: Once | INTRAVENOUS | Status: AC
  Administered 2013-07-06: 150 mg via INTRAVENOUS
  Filled 2013-07-06: qty 5

## 2013-07-06 MED ORDER — SODIUM CHLORIDE 0.9 % IV SOLN
Freq: Once | INTRAVENOUS | Status: AC
  Administered 2013-07-06: 15:00:00 via INTRAVENOUS

## 2013-07-06 MED ORDER — DOXORUBICIN HCL CHEMO IV INJECTION 2 MG/ML
60.0000 mg/m2 | Freq: Once | INTRAVENOUS | Status: AC
  Administered 2013-07-06: 132 mg via INTRAVENOUS
  Filled 2013-07-06: qty 66

## 2013-07-06 MED ORDER — LORAZEPAM 2 MG/ML IJ SOLN
0.5000 mg | Freq: Once | INTRAMUSCULAR | Status: AC
  Administered 2013-07-06: 0.5 mg via INTRAVENOUS

## 2013-07-06 MED ORDER — PALONOSETRON HCL INJECTION 0.25 MG/5ML
0.2500 mg | Freq: Once | INTRAVENOUS | Status: AC
  Administered 2013-07-06: 0.25 mg via INTRAVENOUS

## 2013-07-06 NOTE — Telephone Encounter (Signed)
appts made and printed. Pt is aware that her tx will be added on. i emailed MW to add the tx...td

## 2013-07-06 NOTE — Telephone Encounter (Signed)
Per staff message and POF I have scheduled appts.  JMW  

## 2013-07-06 NOTE — Progress Notes (Signed)
Discharged at 1655 with spouse.  Ambulatory in no distress.

## 2013-07-06 NOTE — Patient Instructions (Signed)
Doing well.  Proceed with chemotherapy.  Please call us if you have any questions or concerns.     Paclitaxel injection What is this medicine? PACLITAXEL (PAK li TAX el) is a chemotherapy drug. It targets fast dividing cells, like cancer cells, and causes these cells to die. This medicine is used to treat ovarian cancer, breast cancer, and other cancers. This medicine may be used for other purposes; ask your health care provider or pharmacist if you have questions. What should I tell my health care provider before I take this medicine? They need to know if you have any of these conditions: -blood disorders -irregular heartbeat -infection (especially a virus infection such as chickenpox, cold sores, or herpes) -liver disease -previous or ongoing radiation therapy -an unusual or allergic reaction to paclitaxel, alcohol, polyoxyethylated castor oil, other chemotherapy agents, other medicines, foods, dyes, or preservatives -pregnant or trying to get pregnant -breast-feeding How should I use this medicine? This drug is given as an infusion into a vein. It is administered in a hospital or clinic by a specially trained health care professional. Talk to your pediatrician regarding the use of this medicine in children. Special care may be needed. Overdosage: If you think you have taken too much of this medicine contact a poison control center or emergency room at once. NOTE: This medicine is only for you. Do not share this medicine with others. What if I miss a dose? It is important not to miss your dose. Call your doctor or health care professional if you are unable to keep an appointment. What may interact with this medicine? Do not take this medicine with any of the following medications: -disulfiram -metronidazole This medicine may also interact with the following medications: -cyclosporine -dexamethasone -diazepam -ketoconazole -medicines to increase blood counts like filgrastim,  pegfilgrastim, sargramostim -other chemotherapy drugs like cisplatin, doxorubicin, epirubicin, etoposide, teniposide, vincristine -quinidine -testosterone -vaccines -verapamil Talk to your doctor or health care professional before taking any of these medicines: -acetaminophen -aspirin -ibuprofen -ketoprofen -naproxen This list may not describe all possible interactions. Give your health care provider a list of all the medicines, herbs, non-prescription drugs, or dietary supplements you use. Also tell them if you smoke, drink alcohol, or use illegal drugs. Some items may interact with your medicine. What should I watch for while using this medicine? Your condition will be monitored carefully while you are receiving this medicine. You will need important blood work done while you are taking this medicine. This drug may make you feel generally unwell. This is not uncommon, as chemotherapy can affect healthy cells as well as cancer cells. Report any side effects. Continue your course of treatment even though you feel ill unless your doctor tells you to stop. In some cases, you may be given additional medicines to help with side effects. Follow all directions for their use. Call your doctor or health care professional for advice if you get a fever, chills or sore throat, or other symptoms of a cold or flu. Do not treat yourself. This drug decreases your body's ability to fight infections. Try to avoid being around people who are sick. This medicine may increase your risk to bruise or bleed. Call your doctor or health care professional if you notice any unusual bleeding. Be careful brushing and flossing your teeth or using a toothpick because you may get an infection or bleed more easily. If you have any dental work done, tell your dentist you are receiving this medicine. Avoid taking products that contain  aspirin, acetaminophen, ibuprofen, naproxen, or ketoprofen unless instructed by your doctor.  These medicines may hide a fever. Do not become pregnant while taking this medicine. Women should inform their doctor if they wish to become pregnant or think they might be pregnant. There is a potential for serious side effects to an unborn child. Talk to your health care professional or pharmacist for more information. Do not breast-feed an infant while taking this medicine. Men are advised not to father a child while receiving this medicine. What side effects may I notice from receiving this medicine? Side effects that you should report to your doctor or health care professional as soon as possible: -allergic reactions like skin rash, itching or hives, swelling of the face, lips, or tongue -low blood counts - This drug may decrease the number of white blood cells, red blood cells and platelets. You may be at increased risk for infections and bleeding. -signs of infection - fever or chills, cough, sore throat, pain or difficulty passing urine -signs of decreased platelets or bleeding - bruising, pinpoint red spots on the skin, black, tarry stools, nosebleeds -signs of decreased red blood cells - unusually weak or tired, fainting spells, lightheadedness -breathing problems -chest pain -high or low blood pressure -mouth sores -nausea and vomiting -pain, swelling, redness or irritation at the injection site -pain, tingling, numbness in the hands or feet -slow or irregular heartbeat -swelling of the ankle, feet, hands Side effects that usually do not require medical attention (report to your doctor or health care professional if they continue or are bothersome): -bone pain -complete hair loss including hair on your head, underarms, pubic hair, eyebrows, and eyelashes -changes in the color of fingernails -diarrhea -loosening of the fingernails -loss of appetite -muscle or joint pain -red flush to skin -sweating This list may not describe all possible side effects. Call your doctor for  medical advice about side effects. You may report side effects to FDA at 1-800-FDA-1088. Where should I keep my medicine? This drug is given in a hospital or clinic and will not be stored at home. NOTE: This sheet is a summary. It may not cover all possible information. If you have questions about this medicine, talk to your doctor, pharmacist, or health care provider.  2013, [employer]. (11/11/2008 11:54:26 AM) Carboplatin injection What is this medicine? CARBOPLATIN (KAR boe pla tin) is a chemotherapy drug. It targets fast dividing cells, like cancer cells, and causes these cells to die. This medicine is used to treat ovarian cancer and many other cancers. This medicine may be used for other purposes; ask your health care provider or pharmacist if you have questions. What should I tell my health care provider before I take this medicine? They need to know if you have any of these conditions: -blood disorders -hearing problems -kidney disease -recent or ongoing radiation therapy -an unusual or allergic reaction to carboplatin, cisplatin, other chemotherapy, other medicines, foods, dyes, or preservatives -pregnant or trying to get pregnant -breast-feeding How should I use this medicine? This drug is usually given as an infusion into a vein. It is administered in a hospital or clinic by a specially trained health care professional. Talk to your pediatrician regarding the use of this medicine in children. Special care may be needed. Overdosage: If you think you have taken too much of this medicine contact a poison control center or emergency room at once. NOTE: This medicine is only for you. Do not share this medicine with others. What if I  miss a dose? It is important not to miss a dose. Call your doctor or health care professional if you are unable to keep an appointment. What may interact with this medicine? -medicines for seizures -medicines to increase blood counts like  filgrastim, pegfilgrastim, sargramostim -some antibiotics like amikacin, gentamicin, neomycin, streptomycin, tobramycin -vaccines Talk to your doctor or health care professional before taking any of these medicines: -acetaminophen -aspirin -ibuprofen -ketoprofen -naproxen This list may not describe all possible interactions. Give your health care provider a list of all the medicines, herbs, non-prescription drugs, or dietary supplements you use. Also tell them if you smoke, drink alcohol, or use illegal drugs. Some items may interact with your medicine. What should I watch for while using this medicine? Your condition will be monitored carefully while you are receiving this medicine. You will need important blood work done while you are taking this medicine. This drug may make you feel generally unwell. This is not uncommon, as chemotherapy can affect healthy cells as well as cancer cells. Report any side effects. Continue your course of treatment even though you feel ill unless your doctor tells you to stop. In some cases, you may be given additional medicines to help with side effects. Follow all directions for their use. Call your doctor or health care professional for advice if you get a fever, chills or sore throat, or other symptoms of a cold or flu. Do not treat yourself. This drug decreases your body's ability to fight infections. Try to avoid being around people who are sick. This medicine may increase your risk to bruise or bleed. Call your doctor or health care professional if you notice any unusual bleeding. Be careful brushing and flossing your teeth or using a toothpick because you may get an infection or bleed more easily. If you have any dental work done, tell your dentist you are receiving this medicine. Avoid taking products that contain aspirin, acetaminophen, ibuprofen, naproxen, or ketoprofen unless instructed by your doctor. These medicines may hide a fever. Do not become  pregnant while taking this medicine. Women should inform their doctor if they wish to become pregnant or think they might be pregnant. There is a potential for serious side effects to an unborn child. Talk to your health care professional or pharmacist for more information. Do not breast-feed an infant while taking this medicine. What side effects may I notice from receiving this medicine? Side effects that you should report to your doctor or health care professional as soon as possible: -allergic reactions like skin rash, itching or hives, swelling of the face, lips, or tongue -signs of infection - fever or chills, cough, sore throat, pain or difficulty passing urine -signs of decreased platelets or bleeding - bruising, pinpoint red spots on the skin, black, tarry stools, nosebleeds -signs of decreased red blood cells - unusually weak or tired, fainting spells, lightheadedness -breathing problems -changes in hearing -changes in vision -chest pain -high blood pressure -low blood counts - This drug may decrease the number of white blood cells, red blood cells and platelets. You may be at increased risk for infections and bleeding. -nausea and vomiting -pain, swelling, redness or irritation at the injection site -pain, tingling, numbness in the hands or feet -problems with balance, talking, walking -trouble passing urine or change in the amount of urine Side effects that usually do not require medical attention (report to your doctor or health care professional if they continue or are bothersome): -hair loss -loss of appetite -metallic taste  in the mouth or changes in taste This list may not describe all possible side effects. Call your doctor for medical advice about side effects. You may report side effects to FDA at 1-800-FDA-1088. Where should I keep my medicine? This drug is given in a hospital or clinic and will not be stored at home. NOTE: This sheet is a summary. It may not cover all  possible information. If you have questions about this medicine, talk to your doctor, pharmacist, or health care provider.  2012, [employer]. (03/05/2008 2:38:05 PM)

## 2013-07-06 NOTE — Telephone Encounter (Signed)
i could not schedule the pt to see Dr. Kathrynn Running w/o a referral.per orders Clydie Braun. Clydie Braun also made me aware that the pt was seen by Dr. Basilio Cairo on 05/25/13, but if the pt is seeing Dr. Kathrynn Running she needs a referral...td

## 2013-07-06 NOTE — Patient Instructions (Addendum)
[facility] Discharge Instructions for Patients Receiving Chemotherapy  Today you received the following chemotherapy agents Adriamycin and Cytoxan.  To help prevent nausea and vomiting after your treatment, we encourage you to take your nausea medication.   If you develop nausea and vomiting that is not controlled by your nausea medication, call the clinic.   BELOW ARE SYMPTOMS THAT SHOULD BE REPORTED IMMEDIATELY:  *FEVER GREATER THAN 100.5 F  *CHILLS WITH OR WITHOUT FEVER  NAUSEA AND VOMITING THAT IS NOT CONTROLLED WITH YOUR NAUSEA MEDICATION  *UNUSUAL SHORTNESS OF BREATH  *UNUSUAL BRUISING OR BLEEDING  TENDERNESS IN MOUTH AND THROAT WITH OR WITHOUT PRESENCE OF ULCERS  *URINARY PROBLEMS  *BOWEL PROBLEMS  UNUSUAL RASH Items with * indicate a potential emergency and should be followed up as soon as possible.  Feel free to call the clinic you have any questions or concerns. The clinic phone number is [phone].    

## 2013-07-06 NOTE — Progress Notes (Signed)
OFFICE PROGRESS NOTE  CC**  Erika Flake, MD [address] 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    PRIOR THERAPY: 1. 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 2 months ago this began getting larger rapidly. She was seen in an urgent care where this mass was thought to be an abscess. Apparently there was an attempt to excise or drain this that was not successful and she was referred to Dr. Dwain Sarna for evaluation. She underwent punch biopsy on 04/23/13 of her skin and the mass. She was then also sent for mm/us. The biopsy shows invasive high grade carcinoma that appears to be breast. It is ER 14%, PR negative, HER-2/neu negative with a Ki-67 that approaches 100%. She has also undergone mm with at least 10 cm upper inner left breast tumor with mild overyling skin thickening. There is also a 3x10 mm cluster of calcs near there also. There is indeterminate 7 mm level 1 left axillary node, she underwent a lymph node biopsy that was positive for disease. No mm evidence of right breast malignancy.  An MRI of the breasts was done on 05/22/13 and showed a 14 cm mass in the left breast protruding from the skin and also a 2.5 cm level II axillary node.   2.  Patient evaluated and began on neoadjuvant chemotherapy.  Adriamycin and Cytoxan began on 05/25/13.    #3 staging PET/CT performed on 06/13/2013 revealed no evidence of distant disease.  #4 anemia of iron deficiency  CURRENT THERAPY: Cycle 4 day 1 of dose dense A/C  INTERVAL HISTORY: Erika [name] 42 y.o. female returns for f/u today. She is doing well.  Her tumor is much decreased.  She has been receiving lovenox for a blood clot in her right upper extremity.  She is tolerating the injections without difficulty.  She has no easy bruising/bleeding.  She has mild numbness in her  fingertips and toes.  Otherwise, a 10 point ROS is negative.   Marland Kitchen MEDICAL HISTORY: Past Medical History  Diagnosis Date  . Breast cancer   . Headache(784.0)     Hx: of Migraines    ALLERGIES:  has No Known Allergies.  MEDICATIONS:  Current Outpatient Prescriptions  Medication Sig Dispense Refill  . ciprofloxacin (CIPRO) 500 MG tablet Take 500 mg by mouth 2 (two) times daily. #14 filled 06/29/13      . dexamethasone (DECADRON) 4 MG tablet Take 8 mg by mouth See admin instructions. Take daily for 3 days starting day after chemo      . enoxaparin (LOVENOX) 100 MG/ML injection Inject 1.5 mLs (150 mg total) into the skin daily.  20 mL  0  . fentaNYL (DURAGESIC - DOSED MCG/HR) 25 MCG/HR Place 1 patch (25 mcg total) onto the skin every 3 (three) days.  5 patch  0  . HYDROcodone-acetaminophen (NORCO/VICODIN) 5-325 MG per tablet Take 2 tablets by mouth every 8 (eight) hours as needed for pain.  30 tablet  0  . ibuprofen (ADVIL,MOTRIN) 200 MG tablet Take 600-800 mg by mouth every 4 (four) hours as needed for pain.       Marland Kitchen lidocaine-prilocaine (EMLA) cream Apply 1 application topically every 14 (fourteen) days. Every other Friday prior to chemo      . LORazepam (ATIVAN) 0.5 MG tablet Take 0.5 mg by mouth daily as needed (  for nausea on chemo days).       . ondansetron (ZOFRAN) 8 MG tablet Take 8 mg by mouth 2 (two) times daily as needed for nausea. Start 72 hours after chemo      . oxyCODONE (OXY IR/ROXICODONE) 5 MG immediate release tablet Take 1 tablet (5 mg total) by mouth every 4 (four) hours as needed for pain.  30 tablet  0  . potassium chloride SA (K-DUR,KLOR-CON) 20 MEQ tablet Take 1 tablet (20 mEq total) by mouth 3 (three) times daily.  28 tablet  0  . prochlorperazine (COMPAZINE) 10 MG tablet Take 10 mg by mouth every 6 (six) hours as needed (nausea).      . zolpidem (AMBIEN) 5 MG tablet Take 1 tablet (5 mg total) by mouth at bedtime as needed for sleep.  30 tablet  0   No current  facility-administered medications for this visit.   Facility-Administered Medications Ordered in Other Visits  Medication Dose Route Frequency Provider Last Rate Last Dose  . cyclophosphamide (CYTOXAN) 1,320 mg in sodium chloride 0.9 % 250 mL chemo infusion  600 mg/m2 (Treatment Plan Actual) Intravenous Once Victorino December, MD 632 mL/hr at 07/06/13 1601 1,320 mg at 07/06/13 1601    SURGICAL HISTORY:  Past Surgical History  Procedure Laterality Date  . Tubal ligation    . Portacath placement Right 05/22/2013    Procedure: INSERTION PORT-A-CATH;  Surgeon: Emelia Loron, MD;  Location: [facility] OR;  Service: General;  Laterality: Right;    REVIEW OF SYSTEMS:  General: fatigue (+), night sweats (-), fever (-), pain (+) Lymph: palpable nodes (-) HEENT: vision changes (-), mucositis (-), gum bleeding (-), epistaxis (-) Cardiovascular: chest pain (-), palpitations (-) Pulmonary: shortness of breath (-), dyspnea on exertion (-), cough (-), hemoptysis (-) GI:  Early satiety (-), melena (-), dysphagia (-), nausea/vomiting (-), diarrhea (-) GU: dysuria (-), hematuria (-), incontinence (-) Musculoskeletal: joint swelling (-), joint pain (-), back pain (-) Neuro: weakness (-), numbness (-), headache (-), confusion (-) Skin: Rash (-), lesions (-), dryness (-) Psych: depression (-), suicidal/homicidal ideation (-), feeling of hopelessness (-)    PHYSICAL EXAMINATION: Blood pressure 108/75, pulse 84, temperature 98.5 F (36.9 C), temperature source Oral, resp. rate 20, height 5' 5.5" (1.664 m), weight 224 lb 11.2 oz (101.923 kg), last menstrual period 05/30/2013. Body mass index is 36.81 kg/(m^2). General: Patient is a well appearing female in no acute distress HEENT: PERRLA, sclerae anicteric no conjunctival pallor, MMM Neck: supple, no palpable adenopathy Lungs: clear to auscultation bilaterally, no wheezes, rhonchi, or rales Cardiovascular: regular rate rhythm, S1, S2, no murmurs, rubs or  gallops Abdomen: Soft, non-tender, non-distended, normoactive bowel sounds, no HSM Extremities: warm and well perfused, no clubbing, cyanosis, or edema Skin: No rashes or lesions Neuro: Non-focal Breasts: left breast with large mass resolving from protruding from the skin, about 3cm, covered with a bandage ECOG PERFORMANCE STATUS: 1 - Symptomatic but completely ambulatory  LABORATORY DATA: Lab Results  Component Value Date   WBC 8.2 07/06/2013   HGB 8.4* 07/06/2013   HCT 25.9* 07/06/2013   MCV 76.1* 07/06/2013   PLT 341 07/06/2013      Chemistry      Component Value Date/Time   NA 144 07/06/2013 1130   NA 139 05/18/2013 1205   K 3.1* 07/06/2013 1130   K 3.8 05/18/2013 1205   CL 102 06/01/2013 1500   CL 103 05/18/2013 1205   CO2 26 07/06/2013 1130   CO2 28 05/18/2013  1205   BUN 5.3* 07/06/2013 1130   BUN 8 05/18/2013 1205   CREATININE 0.8 07/06/2013 1130   CREATININE 0.84 05/18/2013 1205      Component Value Date/Time   CALCIUM 9.3 07/06/2013 1130   CALCIUM 9.1 05/18/2013 1205   ALKPHOS 117 07/06/2013 1130   ALKPHOS 111 05/18/2013 1205   AST 12 07/06/2013 1130   AST 18 05/18/2013 1205   ALT 12 07/06/2013 1130   ALT 16 05/18/2013 1205   BILITOT <0.20 07/06/2013 1130   BILITOT 0.2* 05/18/2013 1205       RADIOGRAPHIC STUDIES:  Chest 2 View  05/18/2013   *RADIOLOGY REPORT*  Clinical Data: Breast carcinoma  CHEST - 2 VIEW  Comparison: None.  Findings: Probable left breast mass. Lungs clear.  Heart size and pulmonary vascularity normal.  No effusion.  Visualized bones unremarkable.  IMPRESSION: No acute disease   Original Report Authenticated By: D. Andria Rhein, MD   US Breast Left  05/08/2013   *RADIOLOGY REPORT*  Clinical Data:  42 year old female with large left breast mass - recent punch biopsy demonstrating high-grade carcinoma.  DIGITAL DIAGNOSTIC BILATERAL MAMMOGRAM WITH CAD AND LEFT BREAST ULTRASOUND:  Comparison:  None.  Findings:  ACR Breast Density Category 2: There is a scattered fibroglandular  pattern.  A large irregular mass within the upper inner posterior left breast is identified - measuring at least 10 cm. Mild overlying skin thickening is identified compatible with dermal invasion. A 3 x 10 mm cluster of slightly heterogeneous left breast calcifications approximate 4 cm anterior and lateral to this mass is identified.  There is no evidence of suspicious mass, distortion or worrisome calcifications within the right breast. Mammographic images were processed with CAD.  On physical exam, a very large protuberant mass within the upper inner left breast is identified.  Ultrasound is performed, showing a large heterogeneous mass within the upper inner left breast at the 10-11 o'clock position, measuring at least 6.5 cm, but evaluation is limited by transducer skin area and protuberant portion of the mass. A 7 mm level I left axillary lymph node is identified with slightly echogenic rim - suspicious.  IMPRESSION: Large biopsy-proven carcinoma within the upper inner left breast, measuring at least 10 cm mammographically. Mild overlying skin thickening compatible with dermal invasion.  3 x 10 mm cluster of indeterminate left breast calcifications 4 cm anterior and lateral to the biopsy-proven neoplasm.  This may represent DCIS and consider tissue sampling.  Indeterminate 7 mm level I left axillary lymph node - metastatic disease not excluded.  No mammographic evidence of right breast malignancy.  BI-RADS CATEGORY 6:  Known biopsy-proven malignancy - appropriate action should be taken.  RECOMMENDATION: Treatment plan.  Consider biopsies of the indeterminate left axillary lymph node and left breast calcifications as clinically indicated.  Consider bilateral breast MRI as clinically indicated.  I have discussed the findings and recommendations with the patient. Results were also provided in writing at the conclusion of the visit.   Original Report Authenticated By: Harmon Pier, M.D.   Mr Breast Bilateral W Wo  Contrast  05/22/2013   *RADIOLOGY REPORT*  Clinical Data: large invasive carcinoma left upper inner quadrant with dermal involvement  BILATERAL BREAST MRI WITH AND WITHOUT CONTRAST  Technique: Multiplanar, multisequence MR images of both breasts were obtained prior to and following the intravenous administration of 20ml of Multihance.  Three dimensional images were evaluated at the independent DynaCad workstation.  Comparison:  05/08/13  Findings: There is moderate background parenchymal  enhancement. The right breast and axilla are negative.  On the left, there is a large irregular mass with irregular borders in the upper inner quadrant, demonstrating heterogenous internal enhancement.  The mass measures 14 (AP x 7.5 (lateral) x 8 (craniocaudal)cm.  It causes protrusion of the skin of the breast in the 10 - 11 o'clock position, and it is associated with edematous change in the skin of the left breast, primarily in the upper inner quadrant but also in the periareolar in more infra- areolar left breast.  The mass extends through pectoralis major musculature posteriorly, with enhancing mass extending all the way to the posterior edge of the muscle.  In the 2 o'clock position posteriorly there is a 9mm oval enhancing mass with a mixture of plateau and washout kinetics; it is an indeterminate finding but shows the suggestion of a fatty hilum, and it may be an intramammary lymph node.  In the left axilla, there is an enlarged level II lymph node demonstrating hypervascularity and cortical thickening.  It measures 2.5cm in greatest dimension, and the appearance is concerning for metastasis.  There are no other abnormalities on the left.  IMPRESSION: Large left breast malignancy associated with involvement of the dermis and the chest wall, as well as with probable axillary lymph node metastasis.  There is an indeterminate 9mm mass in the 2 o'clock position, that may be an intramammary lymph node.  BI-RADS CATEGORY 6:  Known  biopsy-proven malignancy - appropriate action should be taken.  THREE-DIMENSIONAL MR IMAGE RENDERING ON INDEPENDENT WORKSTATION:  Three-dimensional MR images were rendered by post-processing of the original MR data on an independent workstation.  The three- dimensional MR images were interpreted, and findings were reported in the accompanying complete MRI report for this study.   Original Report Authenticated By: Esperanza Heir, M.D.   Dg Chest Port 1 View  05/22/2013   *RADIOLOGY REPORT*  Clinical Data: Port-A-Cath placement.  PORTABLE CHEST - 1 VIEW  Comparison: 05/18/2013  Findings: Right-sided power injectable Port-A-Cath noted with distal tip projecting over the SVC.  Low lung volumes are present, causing crowding of the pulmonary vasculature.  No pneumothorax noted.  Bilateral midlung subsegmental atelectasis noted.  Borderline cardiomegaly.  IMPRESSION:  1.  Power injectable catheter tip:  SVC.  No pneumothorax.   Original Report Authenticated By: Gaylyn Rong, M.D.   Mm Digital Diagnostic Bilat  05/08/2013   *RADIOLOGY REPORT*  Clinical Data:  42 year old female with large left breast mass - recent punch biopsy demonstrating high-grade carcinoma.  DIGITAL DIAGNOSTIC BILATERAL MAMMOGRAM WITH CAD AND LEFT BREAST ULTRASOUND:  Comparison:  None.  Findings:  ACR Breast Density Category 2: There is a scattered fibroglandular pattern.  A large irregular mass within the upper inner posterior left breast is identified - measuring at least 10 cm. Mild overlying skin thickening is identified compatible with dermal invasion. A 3 x 10 mm cluster of slightly heterogeneous left breast calcifications approximate 4 cm anterior and lateral to this mass is identified.  There is no evidence of suspicious mass, distortion or worrisome calcifications within the right breast. Mammographic images were processed with CAD.  On physical exam, a very large protuberant mass within the upper inner left breast is identified.   Ultrasound is performed, showing a large heterogeneous mass within the upper inner left breast at the 10-11 o'clock position, measuring at least 6.5 cm, but evaluation is limited by transducer skin area and protuberant portion of the mass. A 7 mm level I left axillary lymph node  is identified with slightly echogenic rim - suspicious.  IMPRESSION: Large biopsy-proven carcinoma within the upper inner left breast, measuring at least 10 cm mammographically. Mild overlying skin thickening compatible with dermal invasion.  3 x 10 mm cluster of indeterminate left breast calcifications 4 cm anterior and lateral to the biopsy-proven neoplasm.  This may represent DCIS and consider tissue sampling.  Indeterminate 7 mm level I left axillary lymph node - metastatic disease not excluded.  No mammographic evidence of right breast malignancy.  BI-RADS CATEGORY 6:  Known biopsy-proven malignancy - appropriate action should be taken.  RECOMMENDATION: Treatment plan.  Consider biopsies of the indeterminate left axillary lymph node and left breast calcifications as clinically indicated.  Consider bilateral breast MRI as clinically indicated.  I have discussed the findings and recommendations with the patient. Results were also provided in writing at the conclusion of the visit.   Original Report Authenticated By: Harmon Pier, M.D.   Dg Fluoro Guide Cv Line-no Report  05/22/2013   CLINICAL DATA: surgery   FLOURO GUIDE CV LINE  Fluoroscopy was utilized by the requesting physician.  No radiographic  interpretation.    [place] Lt Breast Bx W Loc Dev 1st Lesion Img Bx Spec US Guide  05/22/2013   **ADDENDUM** CREATED: 05/22/2013 11:12:58  The patient was called by myself, Dr. Micheline Maze today.  The patient states she is doing well since her left axillary node biopsy without problems at the biopsy site.  The patient was given the results of her biopsy which was compatible with metastatic invasive mammary carcinoma.  This is concordant with imaging  findings and patient's known history of recent positive biopsy of a large inner upper left breast mass demonstrating invasive mammary carcinoma. The patient had her breast MRI this morning and will receive Port-A- Cath placement by Dr. Dwain Sarna this afternoon.  Recommend follow- up pending MRI results and per treatment plan.  **END ADDENDUM** SIGNED BY: Melton Alar. Micheline Maze, M.D.  05/21/2013   *RADIOLOGY REPORT*  Clinical Data:  New diagnosis of invasive mammary carcinoma in the left upper inner quadrant.  Abnormal lymph node noted on ultrasound dated 05/08/2013.  ULTRASOUND GUIDED CORE BIOPSY OF THE LEFT AXILLA  The patient and I discussed the procedure of ultrasound-guided biopsy, including benefits and alternatives.  We discussed the high likelihood of a successful procedure. We discussed the risks of the procedure, including infection, bleeding, tissue injury, clip migration, and inadequate sampling.  Written informed consent was given. Appropriate time-out was performed.  Using sterile technique, 2% lidocaine, ultrasound guidance, and a 14 gauge automated biopsy device, biopsy was performed of the abnormal left axillary lymph node using a lateromedial approach. At the conclusion of the procedure, a dumbbell shaped tissue marker clip was deployed into the biopsy cavity. Post biopsy mammogram was not performed.  IMPRESSION: Ultrasound guided biopsy of an abnormal left axillary lymph node. No apparent complications.  Original Report Authenticated By: Cain Saupe, M.D.    ASSESSMENT:   1. Patient is a 42 year old female with high grade invasive breast carcinoma ER 14%, PR negative, HER-2/neu negative.  She was evaluated by Dr. Dwain Sarna.  She will undergo neoadjuvant chemotherapy with dose dense Adriamycin/Cytoxan x 4 cycles followed by weekly Taxol/Carbo x 12 weeks.  She has underwent port placement and chemotherapy class.    #2 patient had CT and PET scan performed. She has no evidence of metastatic  disease distantly.  #3 she was begun on neoadjuvant chemotherapy consisting of Adriamycin Cytoxan given Q2 weekly beginning  on 06/08/2013. Overall she is tolerating this well.  #4 anemia due to menorrhagia. She is now status post therapy heme given on 06/13/2013.  #5 pain  #6 insomnia   PLAN:  1. Labs have recovered.  She will proceed with chemotherapy and take super b complex for the numbness.   2.She will take Kdur TID until her appointment next week.    3. She will continue on Lovenox therapy.    She will return in 1 week for labs, appt, and chemotherapy.     All questions were answered. The patient knows to call the clinic with any problems, questions or concerns. We can certainly see the patient much sooner if necessary.  I spent 25 minutes counseling the patient face to face. The total time spent in the appointment was 30 minutes.  Cherie Ouch Lyn Hollingshead, NP Medical Oncology [facility] Phone: [phone]   07/06/2013, 4:03 PM

## 2013-07-07 ENCOUNTER — Ambulatory Visit (HOSPITAL_BASED_OUTPATIENT_CLINIC_OR_DEPARTMENT_OTHER): Payer: No Typology Code available for payment source

## 2013-07-07 VITALS — BP 115/83 | HR 63 | Temp 97.3°F | Resp 20

## 2013-07-07 DIAGNOSIS — Z5189 Encounter for other specified aftercare: Secondary | ICD-10-CM

## 2013-07-07 DIAGNOSIS — C50219 Malignant neoplasm of upper-inner quadrant of unspecified female breast: Secondary | ICD-10-CM

## 2013-07-07 MED ORDER — PEGFILGRASTIM INJECTION 6 MG/0.6ML
6.0000 mg | Freq: Once | SUBCUTANEOUS | Status: AC
  Administered 2013-07-07: 6 mg via SUBCUTANEOUS

## 2013-07-10 ENCOUNTER — Other Ambulatory Visit: Payer: Self-pay | Admitting: Emergency Medicine

## 2013-07-10 DIAGNOSIS — E876 Hypokalemia: Secondary | ICD-10-CM

## 2013-07-10 MED ORDER — POTASSIUM CHLORIDE CRYS ER 20 MEQ PO TBCR: 20.0000 meq | EXTENDED_RELEASE_TABLET | Freq: Three times a day (TID) | ORAL | Status: DC

## 2013-07-10 MED ORDER — FENTANYL 25 MCG/HR TD PT72: 1.0000 | MEDICATED_PATCH | TRANSDERMAL | Status: DC

## 2013-07-10 MED ORDER — LORAZEPAM 0.5 MG PO TABS: 0.5000 mg | ORAL_TABLET | Freq: Every day | ORAL | Status: DC | PRN

## 2013-07-12 ENCOUNTER — Other Ambulatory Visit: Payer: Self-pay | Admitting: Emergency Medicine

## 2013-07-13 ENCOUNTER — Telehealth: Payer: Self-pay | Admitting: Oncology

## 2013-07-13 ENCOUNTER — Other Ambulatory Visit (HOSPITAL_BASED_OUTPATIENT_CLINIC_OR_DEPARTMENT_OTHER): Payer: No Typology Code available for payment source | Admitting: Lab

## 2013-07-13 ENCOUNTER — Ambulatory Visit (HOSPITAL_BASED_OUTPATIENT_CLINIC_OR_DEPARTMENT_OTHER): Payer: No Typology Code available for payment source

## 2013-07-13 ENCOUNTER — Encounter: Payer: Self-pay | Admitting: Oncology

## 2013-07-13 ENCOUNTER — Ambulatory Visit (HOSPITAL_COMMUNITY)
Admission: RE | Admit: 2013-07-13 | Discharge: 2013-07-13 | Disposition: A | Discharge status: Home / Self Care | Payer: No Typology Code available for payment source | Source: Ambulatory Visit | Attending: Oncology | Admitting: Oncology

## 2013-07-13 ENCOUNTER — Encounter: Payer: Self-pay | Admitting: *Deleted

## 2013-07-13 ENCOUNTER — Ambulatory Visit (HOSPITAL_BASED_OUTPATIENT_CLINIC_OR_DEPARTMENT_OTHER): Payer: No Typology Code available for payment source | Admitting: Oncology

## 2013-07-13 VITALS — BP 111/74 | HR 101 | Temp 98.4°F | Resp 20 | Ht 65.5 in | Wt 224.7 lb

## 2013-07-13 DIAGNOSIS — I808 Phlebitis and thrombophlebitis of other sites: Secondary | ICD-10-CM | POA: Insufficient documentation

## 2013-07-13 DIAGNOSIS — Z5111 Encounter for antineoplastic chemotherapy: Secondary | ICD-10-CM

## 2013-07-13 DIAGNOSIS — C50212 Malignant neoplasm of upper-inner quadrant of left female breast: Secondary | ICD-10-CM

## 2013-07-13 DIAGNOSIS — M79609 Pain in unspecified limb: Secondary | ICD-10-CM

## 2013-07-13 DIAGNOSIS — C50919 Malignant neoplasm of unspecified site of unspecified female breast: Secondary | ICD-10-CM

## 2013-07-13 DIAGNOSIS — C50219 Malignant neoplasm of upper-inner quadrant of unspecified female breast: Secondary | ICD-10-CM

## 2013-07-13 DIAGNOSIS — I82409 Acute embolism and thrombosis of unspecified deep veins of unspecified lower extremity: Secondary | ICD-10-CM | POA: Insufficient documentation

## 2013-07-13 DIAGNOSIS — I82401 Acute embolism and thrombosis of unspecified deep veins of right lower extremity: Secondary | ICD-10-CM

## 2013-07-13 LAB — COMPREHENSIVE METABOLIC PANEL (CC13)
ALT: 10 U/L (ref 0–55)
AST: 6 U/L (ref 5–34)
Albumin: 2.9 g/dL — ABNORMAL LOW (ref 3.5–5.0)
CO2: 25 mEq/L (ref 22–29)
Calcium: 9.2 mg/dL (ref 8.4–10.4)
Chloride: 107 mEq/L (ref 98–109)
Potassium: 3.7 mEq/L (ref 3.5–5.1)
Sodium: 140 mEq/L (ref 136–145)
Total Protein: 6.6 g/dL (ref 6.4–8.3)

## 2013-07-13 LAB — CBC WITH DIFFERENTIAL/PLATELET
BASO%: 1 % (ref 0.0–2.0)
Eosinophils Absolute: 0 10*3/uL (ref 0.0–0.5)
MCHC: 30.8 g/dL — ABNORMAL LOW (ref 31.5–36.0)
MONO#: 0 10*3/uL — ABNORMAL LOW (ref 0.1–0.9)
NEUT#: 0.6 10*3/uL — ABNORMAL LOW (ref 1.5–6.5)
Platelets: 256 10*3/uL (ref 145–400)
RBC: 3.06 10*6/uL — ABNORMAL LOW (ref 3.70–5.45)
WBC: 1 10*3/uL — ABNORMAL LOW (ref 3.9–10.3)
lymph#: 0.4 10*3/uL — ABNORMAL LOW (ref 0.9–3.3)
nRBC: 0 % (ref 0–0)

## 2013-07-13 MED ORDER — PROCHLORPERAZINE MALEATE 10 MG PO TABS: 10.0000 mg | ORAL_TABLET | Freq: Four times a day (QID) | ORAL | Status: DC | PRN

## 2013-07-13 MED ORDER — LORAZEPAM 0.5 MG PO TABS: 0.5000 mg | ORAL_TABLET | Freq: Four times a day (QID) | ORAL | Status: DC | PRN

## 2013-07-13 MED ORDER — GOSERELIN ACETATE 3.6 MG ~~LOC~~ IMPL
3.6000 mg | DRUG_IMPLANT | SUBCUTANEOUS | Status: DC
  Administered 2013-07-13: 3.6 mg via SUBCUTANEOUS
  Filled 2013-07-13: qty 3.6

## 2013-07-13 MED ORDER — HEPARIN SOD (PORK) LOCK FLUSH 100 UNIT/ML IV SOLN
500.0000 [IU] | Freq: Once | INTRAVENOUS | Status: AC
  Administered 2013-07-13: 500 [IU] via INTRAVENOUS
  Filled 2013-07-13: qty 5

## 2013-07-13 MED ORDER — OXYCODONE HCL 5 MG PO TABS: 5.0000 mg | ORAL_TABLET | ORAL | Status: DC | PRN

## 2013-07-13 MED ORDER — DEXAMETHASONE 4 MG PO TABS: 8.0000 mg | ORAL_TABLET | Freq: Two times a day (BID) | ORAL | Status: DC

## 2013-07-13 MED ORDER — SODIUM CHLORIDE 0.9 % IJ SOLN
10.0000 mL | INTRAMUSCULAR | Status: DC | PRN
  Administered 2013-07-13: 10 mL via INTRAVENOUS
  Filled 2013-07-13: qty 10

## 2013-07-13 MED ORDER — CIPROFLOXACIN HCL 500 MG PO TABS: 500.0000 mg | ORAL_TABLET | Freq: Two times a day (BID) | ORAL | Status: DC

## 2013-07-13 MED ORDER — ONDANSETRON HCL 8 MG PO TABS: 8.0000 mg | ORAL_TABLET | Freq: Two times a day (BID) | ORAL | Status: DC

## 2013-07-13 MED ORDER — ENOXAPARIN SODIUM 100 MG/ML ~~LOC~~ SOLN: 150.0000 mg | Freq: Every day | SUBCUTANEOUS | Status: DC

## 2013-07-13 NOTE — Progress Notes (Signed)
VASCULAR LAB PRELIMINARY  PRELIMINARY  PRELIMINARY  PRELIMINARY  Right upper extremity venous duplex completed.    Preliminary report:  Superficial thrombosis noted in the basilic vein from mid upper arm to the ante cubitus.  No evidence of DVT.   Lalena Salas, RVT 07/13/2013, 11:16 AM

## 2013-07-13 NOTE — Progress Notes (Signed)
[facility], [id number], approved lovenox 150mg  from 07/13/13-08/02/13.

## 2013-07-13 NOTE — Progress Notes (Signed)
RECEIVED A FAX FROM [facility] CONCERNING A PRIOR AUTHORIZATION FOR ENOXAPARIN. THIS REQUEST WAS PLACED IN THE MANAGED CARE BOX.

## 2013-07-17 NOTE — Addendum Note (Signed)
Encounter addended by: Delynn Flavin, RN on: 07/17/2013  7:17 PM<BR>     Documentation filed: Notes Section

## 2013-07-17 NOTE — Progress Notes (Signed)
Location of Breast Cancer:Left Breast - Upper Inner Quadrant  Histology per Pathology Report:  Biopsy was performed on 04/23/2013. Pathology revealed :  Breast, biopsy, left mass  - INVASIVE HIGH GRADE CARCINOMA, SEE COMMENT.  Microscopic Comment  The carcinoma demonstrates the follow immunophenotype:  GCDFP - negative expression  Estrogen Receptor - focal weak expression  ADDITIONAL INFORMATION:  CHROMOGENIC IN-SITU HYBRIDIZATION  Results:  HER-2/NEU BY CISH - NO AMPLIFICATION OF HER-2 DETECTED  Did patient present with symptoms (if so, please note symptoms) or was this found on screening mammography?: Patient noticed a small red mass. on the surface of her left breast about 6 months ago. This began to grow and she saw a Dr. in urgent care and he cut into the mass assuming it was an abscess. Patient was subsequently referred to Dr. Derrell Lolling.   Past/Anticipated interventions by surgeon, if WUJ:[id number] Left Breast  Past/Anticipated interventions by medical oncology, if any :  Chemotherapy:neoadjuvant chemotherapy consisting of Adriamycin Cytoxan given Q2 weekly beginning on 06/08/2013 x 4 cycles followed by weekly Taxol/Carbo x 12 weeks    Lymphedema issues, if any:   Pain issues, if any:    SAFETY ISSUES:  Prior radiation?No  Pacemaker/ICD? No  Possible current pregnancy?No  Is the patient on methotrexate? No  Current Complaints / other details: DVT in the RIGHT arm on 06/30/13     Delynn Flavin, RN 07/17/2013,7:04 PM

## 2013-07-18 ENCOUNTER — Ambulatory Visit
Admission: RE | Admit: 2013-07-18 | Discharge: 2013-07-18 | Disposition: A | Discharge status: Home / Self Care | Payer: No Typology Code available for payment source | Source: Ambulatory Visit | Attending: Radiation Oncology | Admitting: Radiation Oncology

## 2013-07-18 ENCOUNTER — Encounter: Payer: Self-pay | Admitting: Radiation Oncology

## 2013-07-18 VITALS — BP 105/75 | HR 89 | Temp 98.3°F | Ht 65.5 in | Wt 223.5 lb

## 2013-07-18 DIAGNOSIS — C50919 Malignant neoplasm of unspecified site of unspecified female breast: Secondary | ICD-10-CM | POA: Insufficient documentation

## 2013-07-18 DIAGNOSIS — C50212 Malignant neoplasm of upper-inner quadrant of left female breast: Secondary | ICD-10-CM

## 2013-07-18 NOTE — Progress Notes (Addendum)
Radiation Oncology         [phone] ________________________________  Name: Erika [name] MRN: [id number]  Date: 07/18/2013  DOB: July 23, 1971  Follow-Up Visit Note  Outpatient  CC: [name], MD  Victorino December, MD  Diagnosis and Prior Radiotherapy:   Stage IIIB Invasive Left breast cancer  Narrative:  The patient returns today for routine follow-up. PET on 7/2 showed:     Large medial left breast mass, max SUV 17.4, corresponding to known  primary breast cancer.   6 mm short-axis left axillary node, non-FDG-avid, max SUV 1.2.  Adjacent biopsy clip.  She still has quite of a bit of chemotherapy scheduled.  I spoke with Dr. Welton Flakes who says the patient is tolerating it well.  She reports tearing/ swelling of right eye x 2days.  Eyelids stuck together in the AM. Not consistent with prior episodes of pink eye.                              ALLERGIES:  has No Known Allergies.  Meds: Current Outpatient Prescriptions  Medication Sig Dispense Refill  . ciprofloxacin (CIPRO) 500 MG tablet Take 1 tablet (500 mg total) by mouth 2 (two) times daily.  14 tablet  0  . dexamethasone (DECADRON) 4 MG tablet Take 8 mg by mouth See admin instructions. Take daily for 3 days starting day after chemo      . dexamethasone (DECADRON) 4 MG tablet Take 2 tablets (8 mg total) by mouth 2 (two) times daily with a meal. Take two times a day starting the day after chemotherapy for 3 days.  30 tablet  1  . enoxaparin (LOVENOX) 100 MG/ML injection Inject 1.5 mLs (150 mg total) into the skin daily.  20 mL  0  . fentaNYL (DURAGESIC - DOSED MCG/HR) 25 MCG/HR Place 1 patch (25 mcg total) onto the skin every 3 (three) days.  5 patch  0  . HYDROcodone-acetaminophen (NORCO/VICODIN) 5-325 MG per tablet Take 2 tablets by mouth every 8 (eight) hours as needed for pain.  30 tablet  0  . ibuprofen (ADVIL,MOTRIN) 200 MG tablet Take 600-800 mg by mouth every 4 (four) hours as needed for pain.       Marland Kitchen  lidocaine-prilocaine (EMLA) cream Apply 1 application topically every 14 (fourteen) days. Every other Friday prior to chemo      . LORazepam (ATIVAN) 0.5 MG tablet Take 1 tablet (0.5 mg total) by mouth every 6 (six) hours as needed (Nausea or vomiting).  30 tablet  0  . ondansetron (ZOFRAN) 8 MG tablet Take 1 tablet (8 mg total) by mouth 2 (two) times daily. Take two times a day starting the day after chemo for 3 days. Then take two times a day as needed for nausea or vomiting.  30 tablet  1  . oxyCODONE (OXY IR/ROXICODONE) 5 MG immediate release tablet Take 1 tablet (5 mg total) by mouth every 4 (four) hours as needed for pain.  30 tablet  0  . potassium chloride SA (K-DUR,KLOR-CON) 20 MEQ tablet Take 1 tablet (20 mEq total) by mouth 3 (three) times daily.  90 tablet  0  . prochlorperazine (COMPAZINE) 10 MG tablet Take 1 tablet (10 mg total) by mouth every 6 (six) hours as needed (Nausea or vomiting).  30 tablet  1  . zolpidem (AMBIEN) 5 MG tablet Take 1 tablet (5 mg total) by mouth at bedtime as needed for sleep.  30 tablet  0   No current facility-administered medications for this encounter.   Facility-Administered Medications Ordered in Other Encounters  Medication Dose Route Frequency Provider Last Rate Last Dose  . sodium chloride 0.9 % injection 10 mL  10 mL Intravenous PRN Victorino December, MD   10 mL at 07/13/13 1132    Physical Findings: The patient is in no acute distress. Patient is alert and oriented.  height is 5' 5.5" (1.664 m) and weight is 223 lb 8 oz (101.379 kg). Her temperature is 98.3 F (36.8 C). Her blood pressure is 105/75 and her pulse is 89. .  L breast - lesion in upper breast no longer draining.  Skin has closed over. Palpable, tender mass in this area. No obvious left axillary adenopathy.  Right eye - slightly erythematous sclera with no discharge.  Lab Findings: Lab Results  Component Value Date   WBC 1.0* 07/13/2013   HGB 7.2* 07/13/2013   HCT 23.4* 07/13/2013   MCV  76.5* 07/13/2013   PLT 256 07/13/2013     Impression/Plan:  Continue systemic therapy as planned. Would appreciate patient being referred back to me when chemotherapy is complete. At that time we will go over consent, and discuss RT planning.    She will discuss tearing/eye sx with med/onc on Friday 8-8.  I spent 10 minutes face to face with the patient and more than 50% of that time was spent in counseling and/or coordination of care. _____________________________________   Lonie Peak, MD

## 2013-07-18 NOTE — Addendum Note (Signed)
Encounter addended by: Delynn Flavin, RN on: 07/18/2013  4:44 PM<BR>     Documentation filed: Charges VN

## 2013-07-18 NOTE — Progress Notes (Addendum)
Location of Breast Cancer:Left Breast - Upper Inner Quadrant   Histology per Pathology Report:  Biopsy was performed on 04/23/2013. Pathology revealed :  Breast, biopsy, left mass  - INVASIVE HIGH GRADE CARCINOMA, SEE COMMENT.  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 on the surface of her left breast about 6 months ago. This began to grow and she saw a Dr. in urgent care and he cut into the mass assuming it was an abscess. Patient was subsequently referred to Dr. Derrell Lolling .  Past/Anticipated interventions by surgeon, if ZOX:WRUEAV Left Breast   Past/Anticipated interventions by medical oncology, if any : Chemotherapy:neoadjuvant chemotherapy consisting of Adriamycin Cytoxan given Q2 weekly beginning on 06/08/2013 x 4 cycles followed by weekly Taxol/Carbo x 12 weeks which will start on 07/20/13   Lymphedema issues, if any:   Pain issues, if any:  SAFETY ISSUES:  Prior radiation?No  Pacemaker/ICD? No  Possible current pregnancy?No  Is the patient on methotrexate? No  Current Complaints / other details: DVT in the RIGHT arm on 06/30/13 - On Lovenox  Currently has dry peeling skin on her hands and c/o dryness on arms.   Swelling of the right upper eyelid with tearing.

## 2013-07-18 NOTE — Addendum Note (Signed)
Encounter addended by: Delynn Flavin, RN on: 07/18/2013  4:22 PM<BR>     Documentation filed: Charges VN, Notes Section

## 2013-07-20 ENCOUNTER — Ambulatory Visit (HOSPITAL_BASED_OUTPATIENT_CLINIC_OR_DEPARTMENT_OTHER): Payer: No Typology Code available for payment source

## 2013-07-20 ENCOUNTER — Ambulatory Visit: Payer: No Typology Code available for payment source | Admitting: Oncology

## 2013-07-20 ENCOUNTER — Other Ambulatory Visit: Payer: No Typology Code available for payment source | Admitting: Lab

## 2013-07-20 ENCOUNTER — Ambulatory Visit (HOSPITAL_BASED_OUTPATIENT_CLINIC_OR_DEPARTMENT_OTHER): Payer: No Typology Code available for payment source | Admitting: Adult Health

## 2013-07-20 ENCOUNTER — Encounter: Payer: Self-pay | Admitting: Adult Health

## 2013-07-20 ENCOUNTER — Other Ambulatory Visit (HOSPITAL_BASED_OUTPATIENT_CLINIC_OR_DEPARTMENT_OTHER): Payer: No Typology Code available for payment source | Admitting: Lab

## 2013-07-20 VITALS — BP 105/65 | HR 74 | Temp 98.5°F | Resp 20

## 2013-07-20 VITALS — BP 130/68 | HR 56 | Temp 98.2°F | Resp 20 | Ht 65.5 in | Wt 222.1 lb

## 2013-07-20 DIAGNOSIS — Z171 Estrogen receptor negative status [ER-]: Secondary | ICD-10-CM

## 2013-07-20 DIAGNOSIS — C50212 Malignant neoplasm of upper-inner quadrant of left female breast: Secondary | ICD-10-CM

## 2013-07-20 DIAGNOSIS — R52 Pain, unspecified: Secondary | ICD-10-CM

## 2013-07-20 DIAGNOSIS — C773 Secondary and unspecified malignant neoplasm of axilla and upper limb lymph nodes: Secondary | ICD-10-CM

## 2013-07-20 DIAGNOSIS — C50919 Malignant neoplasm of unspecified site of unspecified female breast: Secondary | ICD-10-CM

## 2013-07-20 DIAGNOSIS — D500 Iron deficiency anemia secondary to blood loss (chronic): Secondary | ICD-10-CM

## 2013-07-20 DIAGNOSIS — N644 Mastodynia: Secondary | ICD-10-CM

## 2013-07-20 DIAGNOSIS — C50219 Malignant neoplasm of upper-inner quadrant of unspecified female breast: Secondary | ICD-10-CM

## 2013-07-20 DIAGNOSIS — D509 Iron deficiency anemia, unspecified: Secondary | ICD-10-CM

## 2013-07-20 DIAGNOSIS — N920 Excessive and frequent menstruation with regular cycle: Secondary | ICD-10-CM

## 2013-07-20 DIAGNOSIS — Z5111 Encounter for antineoplastic chemotherapy: Secondary | ICD-10-CM

## 2013-07-20 DIAGNOSIS — G4700 Insomnia, unspecified: Secondary | ICD-10-CM

## 2013-07-20 LAB — CBC WITH DIFFERENTIAL/PLATELET
BASO%: 0.3 % (ref 0.0–2.0)
EOS%: 0.1 % (ref 0.0–7.0)
HCT: 27.4 % — ABNORMAL LOW (ref 34.8–46.6)
MCH: 24.1 pg — ABNORMAL LOW (ref 25.1–34.0)
MCHC: 30.7 g/dL — ABNORMAL LOW (ref 31.5–36.0)
NEUT%: 81.4 % — ABNORMAL HIGH (ref 38.4–76.8)
RBC: 3.49 10*6/uL — ABNORMAL LOW (ref 3.70–5.45)
RDW: 28.6 % — ABNORMAL HIGH (ref 11.2–14.5)
WBC: 7.7 10*3/uL (ref 3.9–10.3)
lymph#: 0.9 10*3/uL (ref 0.9–3.3)
nRBC: 3 % — ABNORMAL HIGH (ref 0–0)

## 2013-07-20 LAB — IRON AND TIBC CHCC
%SAT: 28 % (ref 21–57)
Iron: 56 ug/dL (ref 41–142)
UIBC: 147 ug/dL (ref 120–384)

## 2013-07-20 LAB — COMPREHENSIVE METABOLIC PANEL (CC13)
ALT: 13 U/L (ref 0–55)
AST: 14 U/L (ref 5–34)
Albumin: 2.9 g/dL — ABNORMAL LOW (ref 3.5–5.0)
Alkaline Phosphatase: 117 U/L (ref 40–150)
BUN: 5.4 mg/dL — ABNORMAL LOW (ref 7.0–26.0)
Potassium: 3.9 mEq/L (ref 3.5–5.1)

## 2013-07-20 LAB — FERRITIN CHCC: Ferritin: 982 ng/ml — ABNORMAL HIGH (ref 9–269)

## 2013-07-20 MED ORDER — SODIUM CHLORIDE 0.9 % IV SOLN
300.0000 mg | Freq: Once | INTRAVENOUS | Status: AC
  Administered 2013-07-20: 300 mg via INTRAVENOUS
  Filled 2013-07-20: qty 30

## 2013-07-20 MED ORDER — FAMOTIDINE IN NACL 20-0.9 MG/50ML-% IV SOLN
20.0000 mg | Freq: Once | INTRAVENOUS | Status: AC
  Administered 2013-07-20: 20 mg via INTRAVENOUS

## 2013-07-20 MED ORDER — ONDANSETRON 16 MG/50ML IVPB (CHCC)
16.0000 mg | Freq: Once | INTRAVENOUS | Status: AC
  Administered 2013-07-20: 16 mg via INTRAVENOUS

## 2013-07-20 MED ORDER — DEXAMETHASONE SODIUM PHOSPHATE 20 MG/5ML IJ SOLN
20.0000 mg | Freq: Once | INTRAMUSCULAR | Status: AC
  Administered 2013-07-20: 20 mg via INTRAVENOUS

## 2013-07-20 MED ORDER — SODIUM CHLORIDE 0.9 % IV SOLN
Freq: Once | INTRAVENOUS | Status: AC
  Administered 2013-07-20: 13:00:00 via INTRAVENOUS

## 2013-07-20 MED ORDER — PACLITAXEL CHEMO INJECTION 300 MG/50ML
80.0000 mg/m2 | Freq: Once | INTRAVENOUS | Status: AC
  Administered 2013-07-20: 174 mg via INTRAVENOUS
  Filled 2013-07-20: qty 29

## 2013-07-20 MED ORDER — SODIUM CHLORIDE 0.9 % IJ SOLN
10.0000 mL | INTRAMUSCULAR | Status: DC | PRN
  Administered 2013-07-20: 10 mL
  Filled 2013-07-20: qty 10

## 2013-07-20 MED ORDER — HEPARIN SOD (PORK) LOCK FLUSH 100 UNIT/ML IV SOLN
500.0000 [IU] | Freq: Once | INTRAVENOUS | Status: AC | PRN
  Administered 2013-07-20: 500 [IU]
  Filled 2013-07-20: qty 5

## 2013-07-20 MED ORDER — HYDROCODONE-ACETAMINOPHEN 5-325 MG PO TABS: 2.0000 | ORAL_TABLET | Freq: Three times a day (TID) | ORAL | Status: DC | PRN

## 2013-07-20 MED ORDER — DIPHENHYDRAMINE HCL 50 MG/ML IJ SOLN
50.0000 mg | Freq: Once | INTRAMUSCULAR | Status: AC
  Administered 2013-07-20: 50 mg via INTRAVENOUS

## 2013-07-20 NOTE — Patient Instructions (Addendum)
[facility] Discharge Instructions for Patients Receiving Chemotherapy  Today you received the following chemotherapy agents:  Taxol and Carboplatin  To help prevent nausea and vomiting after your treatment, we encourage you to take your nausea medication as ordered per MD.   If you develop nausea and vomiting that is not controlled by your nausea medication, call the clinic.   BELOW ARE SYMPTOMS THAT SHOULD BE REPORTED IMMEDIATELY:  *FEVER GREATER THAN 100.5 F  *CHILLS WITH OR WITHOUT FEVER  NAUSEA AND VOMITING THAT IS NOT CONTROLLED WITH YOUR NAUSEA MEDICATION  *UNUSUAL SHORTNESS OF BREATH  *UNUSUAL BRUISING OR BLEEDING  TENDERNESS IN MOUTH AND THROAT WITH OR WITHOUT PRESENCE OF ULCERS  *URINARY PROBLEMS  *BOWEL PROBLEMS  UNUSUAL RASH Items with * indicate a potential emergency and should be followed up as soon as possible.  Feel free to call the clinic you have any questions or concerns. The clinic phone number is [phone].    

## 2013-07-20 NOTE — Addendum Note (Signed)
Encounter addended by: Damaris Geers Mintz Srinika Delone, RN on: 07/20/2013  4:18 PM<BR>     Documentation filed: Charges VN

## 2013-07-20 NOTE — Progress Notes (Signed)
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    PRIOR THERAPY: 1. 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 2 months ago this began getting larger rapidly. She was seen in an urgent care where this mass was thought to be an abscess. Apparently there was an attempt to excise or drain this that was not successful and she was referred to Dr. Dwain Sarna for evaluation. She underwent punch biopsy on 04/23/13 of her skin and the mass. She was then also sent for mm/us. The biopsy shows invasive high grade carcinoma that appears to be breast. It is ER 14%, PR negative, HER-2/neu negative with a Ki-67 that approaches 100%. She has also undergone mm with at least 10 cm upper inner left breast tumor with mild overyling skin thickening. There is also a 3x10 mm cluster of calcs near there also. There is indeterminate 7 mm level 1 left axillary node, she underwent a lymph node biopsy that was positive for disease. No mm evidence of right breast malignancy.  An MRI of the breasts was done on 05/22/13 and showed a 14 cm mass in the left breast protruding from the skin and also a 2.5 cm level II axillary node.   2.  Patient evaluated and began on neoadjuvant chemotherapy.  Adriamycin and Cytoxan began on 05/25/13.    #3 staging PET/CT performed on 06/13/2013 revealed no evidence of distant disease.  #4 anemia of iron deficiency  CURRENT THERAPY: Taxol/Carbo week one  INTERVAL HISTORY: Erika [name] 42 y.o. female returns for f/u today. She is doing well.  Her tumor is much decreased. She had an ultrasound of her right arm that determined her DVT was essentially resolved, and all that remained was a superficial clot.  Her arm continues to improve, and she is tolerating the lovenox well denying any  easy bruising, easy bleeding, or any further concerns.  She denies fevers, chills, nausea, vomiting, constipation, diarrhea, numbness or any other problems.  A 10 point ROS is negative.     Marland Kitchen MEDICAL HISTORY: Past Medical History  Diagnosis Date  . Breast cancer     Invasive High Grade Carcinoma  . Headache(784.0)     Hx: of Migraines  . Status post chemotherapy     Adriamycin and Cytoxan    ALLERGIES:  has No Known Allergies.  MEDICATIONS:  Current Outpatient Prescriptions  Medication Sig Dispense Refill  . ciprofloxacin (CIPRO) 500 MG tablet Take 1 tablet (500 mg total) by mouth 2 (two) times daily.  14 tablet  0  . dexamethasone (DECADRON) 4 MG tablet Take 8 mg by mouth See admin instructions. Take daily for 3 days starting day after chemo      . dexamethasone (DECADRON) 4 MG tablet Take 2 tablets (8 mg total) by mouth 2 (two) times daily with a meal. Take two times a day starting the day after chemotherapy for 3 days.  30 tablet  1  . enoxaparin (LOVENOX) 100 MG/ML injection Inject 1.5 mLs (150 mg total) into the skin daily.  20 mL  0  . fentaNYL (DURAGESIC - DOSED MCG/HR) 25 MCG/HR Place 1 patch (25 mcg total) onto the skin every 3 (three) days.  5 patch  0  . HYDROcodone-acetaminophen (NORCO/VICODIN) 5-325 MG per tablet Take 2 tablets by  mouth every 8 (eight) hours as needed for pain.  30 tablet  0  . ibuprofen (ADVIL,MOTRIN) 200 MG tablet Take 600-800 mg by mouth every 4 (four) hours as needed for pain.       Marland Kitchen lidocaine-prilocaine (EMLA) cream Apply 1 application topically every 14 (fourteen) days. Every other Friday prior to chemo      . LORazepam (ATIVAN) 0.5 MG tablet Take 1 tablet (0.5 mg total) by mouth every 6 (six) hours as needed (Nausea or vomiting).  30 tablet  0  . ondansetron (ZOFRAN) 8 MG tablet Take 1 tablet (8 mg total) by mouth 2 (two) times daily. Take two times a day starting the day after chemo for 3 days. Then take two times a day as needed for nausea or  vomiting.  30 tablet  1  . oxyCODONE (OXY IR/ROXICODONE) 5 MG immediate release tablet Take 1 tablet (5 mg total) by mouth every 4 (four) hours as needed for pain.  30 tablet  0  . potassium chloride SA (K-DUR,KLOR-CON) 20 MEQ tablet Take 1 tablet (20 mEq total) by mouth 3 (three) times daily.  90 tablet  0  . prochlorperazine (COMPAZINE) 10 MG tablet Take 1 tablet (10 mg total) by mouth every 6 (six) hours as needed (Nausea or vomiting).  30 tablet  1  . zolpidem (AMBIEN) 5 MG tablet Take 1 tablet (5 mg total) by mouth at bedtime as needed for sleep.  30 tablet  0   No current facility-administered medications for this visit.   Facility-Administered Medications Ordered in Other Visits  Medication Dose Route Frequency Provider Last Rate Last Dose  . sodium chloride 0.9 % injection 10 mL  10 mL Intravenous PRN Victorino December, MD   10 mL at 07/13/13 1132    SURGICAL HISTORY:  Past Surgical History  Procedure Laterality Date  . Tubal ligation    . Portacath placement Right 05/22/2013    Procedure: INSERTION PORT-A-CATH;  Surgeon: Emelia Loron, MD;  Location: [facility] OR;  Service: General;  Laterality: Right;  . Left breast needle core biopsy Left 05/21/13  . Left breast needle core  biopsy Left 06/22/13    REVIEW OF SYSTEMS:  General: fatigue (+), night sweats (-), fever (-), pain (+) Lymph: palpable nodes (-) HEENT: vision changes (-), mucositis (-), gum bleeding (-), epistaxis (-) Cardiovascular: chest pain (-), palpitations (-) Pulmonary: shortness of breath (-), dyspnea on exertion (-), cough (-), hemoptysis (-) GI:  Early satiety (-), melena (-), dysphagia (-), nausea/vomiting (-), diarrhea (-) GU: dysuria (-), hematuria (-), incontinence (-) Musculoskeletal: joint swelling (-), joint pain (-), back pain (-) Neuro: weakness (-), numbness (-), headache (-), confusion (-) Skin: Rash (-), lesions (-), dryness (-) Psych: depression (-), suicidal/homicidal ideation (-), feeling of  hopelessness (-)    PHYSICAL EXAMINATION: Blood pressure 130/68, pulse 56, temperature 98.2 F (36.8 C), temperature source Oral, resp. rate 20, height 5' 5.5" (1.664 m), weight 222 lb 1.6 oz (100.744 kg). Body mass index is 36.38 kg/(m^2). General: Patient is a well appearing female in no acute distress HEENT: PERRLA, sclerae anicteric no conjunctival pallor, MMM Neck: supple, no palpable adenopathy Lungs: clear to auscultation bilaterally, no wheezes, rhonchi, or rales Cardiovascular: regular rate rhythm, S1, S2, no murmurs, rubs or gallops Abdomen: Soft, non-tender, non-distended, normoactive bowel sounds, no HSM Extremities: warm and well perfused, no clubbing, cyanosis, or edema Skin: No rashes or lesions Neuro: Non-focal Breasts: left breast mass is much smaller, now hardly palpable, skin change  from where mass ulcerated is evident with mild thickness, however lesion is much improved.  ECOG PERFORMANCE STATUS: 1 - Symptomatic but completely ambulatory  LABORATORY DATA: Lab Results  Component Value Date   WBC 7.7 07/20/2013   HGB 8.4* 07/20/2013   HCT 27.4* 07/20/2013   MCV 78.5* 07/20/2013   PLT 256 07/20/2013      Chemistry      Component Value Date/Time   NA 140 07/20/2013 1109   NA 139 05/18/2013 1205   K 3.9 07/20/2013 1109   K 3.8 05/18/2013 1205   CL 102 06/01/2013 1500   CL 103 05/18/2013 1205   CO2 25 07/20/2013 1109   CO2 28 05/18/2013 1205   BUN 5.4* 07/20/2013 1109   BUN 8 05/18/2013 1205   CREATININE 0.8 07/20/2013 1109   CREATININE 0.84 05/18/2013 1205      Component Value Date/Time   CALCIUM 9.6 07/20/2013 1109   CALCIUM 9.1 05/18/2013 1205   ALKPHOS 117 07/20/2013 1109   ALKPHOS 111 05/18/2013 1205   AST 14 07/20/2013 1109   AST 18 05/18/2013 1205   ALT 13 07/20/2013 1109   ALT 16 05/18/2013 1205   BILITOT <0.20 07/20/2013 1109   BILITOT 0.2* 05/18/2013 1205       RADIOGRAPHIC STUDIES:  Chest 2 View  05/18/2013   *RADIOLOGY REPORT*  Clinical Data: Breast carcinoma  CHEST - 2 VIEW   Comparison: None.  Findings: Probable left breast mass. Lungs clear.  Heart size and pulmonary vascularity normal.  No effusion.  Visualized bones unremarkable.  IMPRESSION: No acute disease   Original Report Authenticated By: D. Andria Rhein, MD   US Breast Left  05/08/2013   *RADIOLOGY REPORT*  Clinical Data:  42 year old female with large left breast mass - recent punch biopsy demonstrating high-grade carcinoma.  DIGITAL DIAGNOSTIC BILATERAL MAMMOGRAM WITH CAD AND LEFT BREAST ULTRASOUND:  Comparison:  None.  Findings:  ACR Breast Density Category 2: There is a scattered fibroglandular pattern.  A large irregular mass within the upper inner posterior left breast is identified - measuring at least 10 cm. Mild overlying skin thickening is identified compatible with dermal invasion. A 3 x 10 mm cluster of slightly heterogeneous left breast calcifications approximate 4 cm anterior and lateral to this mass is identified.  There is no evidence of suspicious mass, distortion or worrisome calcifications within the right breast. Mammographic images were processed with CAD.  On physical exam, a very large protuberant mass within the upper inner left breast is identified.  Ultrasound is performed, showing a large heterogeneous mass within the upper inner left breast at the 10-11 o'clock position, measuring at least 6.5 cm, but evaluation is limited by transducer skin area and protuberant portion of the mass. A 7 mm level I left axillary lymph node is identified with slightly echogenic rim - suspicious.  IMPRESSION: Large biopsy-proven carcinoma within the upper inner left breast, measuring at least 10 cm mammographically. Mild overlying skin thickening compatible with dermal invasion.  3 x 10 mm cluster of indeterminate left breast calcifications 4 cm anterior and lateral to the biopsy-proven neoplasm.  This may represent DCIS and consider tissue sampling.  Indeterminate 7 mm level I left axillary lymph node - metastatic  disease not excluded.  No mammographic evidence of right breast malignancy.  BI-RADS CATEGORY 6:  Known biopsy-proven malignancy - appropriate action should be taken.  RECOMMENDATION: Treatment plan.  Consider biopsies of the indeterminate left axillary lymph node and left breast calcifications as clinically indicated.  Consider bilateral breast MRI as clinically indicated.  I have discussed the findings and recommendations with the patient. Results were also provided in writing at the conclusion of the visit.   Original Report Authenticated By: Harmon Pier, M.D.   Mr Breast Bilateral W Wo Contrast  05/22/2013   *RADIOLOGY REPORT*  Clinical Data: large invasive carcinoma left upper inner quadrant with dermal involvement  BILATERAL BREAST MRI WITH AND WITHOUT CONTRAST  Technique: Multiplanar, multisequence MR images of both breasts were obtained prior to and following the intravenous administration of 20ml of Multihance.  Three dimensional images were evaluated at the independent DynaCad workstation.  Comparison:  05/08/13  Findings: There is moderate background parenchymal enhancement. The right breast and axilla are negative.  On the left, there is a large irregular mass with irregular borders in the upper inner quadrant, demonstrating heterogenous internal enhancement.  The mass measures 14 (AP x 7.5 (lateral) x 8 (craniocaudal)cm.  It causes protrusion of the skin of the breast in the 10 - 11 o'clock position, and it is associated with edematous change in the skin of the left breast, primarily in the upper inner quadrant but also in the periareolar in more infra- areolar left breast.  The mass extends through pectoralis major musculature posteriorly, with enhancing mass extending all the way to the posterior edge of the muscle.  In the 2 o'clock position posteriorly there is a 9mm oval enhancing mass with a mixture of plateau and washout kinetics; it is an indeterminate finding but shows the suggestion of a  fatty hilum, and it may be an intramammary lymph node.  In the left axilla, there is an enlarged level II lymph node demonstrating hypervascularity and cortical thickening.  It measures 2.5cm in greatest dimension, and the appearance is concerning for metastasis.  There are no other abnormalities on the left.  IMPRESSION: Large left breast malignancy associated with involvement of the dermis and the chest wall, as well as with probable axillary lymph node metastasis.  There is an indeterminate 9mm mass in the 2 o'clock position, that may be an intramammary lymph node.  BI-RADS CATEGORY 6:  Known biopsy-proven malignancy - appropriate action should be taken.  THREE-DIMENSIONAL MR IMAGE RENDERING ON INDEPENDENT WORKSTATION:  Three-dimensional MR images were rendered by post-processing of the original MR data on an independent workstation.  The three- dimensional MR images were interpreted, and findings were reported in the accompanying complete MRI report for this study.   Original Report Authenticated By: Esperanza Heir, M.D.   Dg Chest Port 1 View  05/22/2013   *RADIOLOGY REPORT*  Clinical Data: Port-A-Cath placement.  PORTABLE CHEST - 1 VIEW  Comparison: 05/18/2013  Findings: Right-sided power injectable Port-A-Cath noted with distal tip projecting over the SVC.  Low lung volumes are present, causing crowding of the pulmonary vasculature.  No pneumothorax noted.  Bilateral midlung subsegmental atelectasis noted.  Borderline cardiomegaly.  IMPRESSION:  1.  Power injectable catheter tip:  SVC.  No pneumothorax.   Original Report Authenticated By: Gaylyn Rong, M.D.   Mm Digital Diagnostic Bilat  05/08/2013   *RADIOLOGY REPORT*  Clinical Data:  42 year old female with large left breast mass - recent punch biopsy demonstrating high-grade carcinoma.  DIGITAL DIAGNOSTIC BILATERAL MAMMOGRAM WITH CAD AND LEFT BREAST ULTRASOUND:  Comparison:  None.  Findings:  ACR Breast Density Category 2: There is a scattered  fibroglandular pattern.  A large irregular mass within the upper inner posterior left breast is identified - measuring at least 10 cm. Mild overlying  skin thickening is identified compatible with dermal invasion. A 3 x 10 mm cluster of slightly heterogeneous left breast calcifications approximate 4 cm anterior and lateral to this mass is identified.  There is no evidence of suspicious mass, distortion or worrisome calcifications within the right breast. Mammographic images were processed with CAD.  On physical exam, a very large protuberant mass within the upper inner left breast is identified.  Ultrasound is performed, showing a large heterogeneous mass within the upper inner left breast at the 10-11 o'clock position, measuring at least 6.5 cm, but evaluation is limited by transducer skin area and protuberant portion of the mass. A 7 mm level I left axillary lymph node is identified with slightly echogenic rim - suspicious.  IMPRESSION: Large biopsy-proven carcinoma within the upper inner left breast, measuring at least 10 cm mammographically. Mild overlying skin thickening compatible with dermal invasion.  3 x 10 mm cluster of indeterminate left breast calcifications 4 cm anterior and lateral to the biopsy-proven neoplasm.  This may represent DCIS and consider tissue sampling.  Indeterminate 7 mm level I left axillary lymph node - metastatic disease not excluded.  No mammographic evidence of right breast malignancy.  BI-RADS CATEGORY 6:  Known biopsy-proven malignancy - appropriate action should be taken.  RECOMMENDATION: Treatment plan.  Consider biopsies of the indeterminate left axillary lymph node and left breast calcifications as clinically indicated.  Consider bilateral breast MRI as clinically indicated.  I have discussed the findings and recommendations with the patient. Results were also provided in writing at the conclusion of the visit.   Original Report Authenticated By: Harmon Pier, M.D.   Dg Fluoro  Guide Cv Line-no Report  05/22/2013   CLINICAL DATA: surgery   FLOURO GUIDE CV LINE  Fluoroscopy was utilized by the requesting physician.  No radiographic  interpretation.    [place] Lt Breast Bx W Loc Dev 1st Lesion Img Bx Spec US Guide  05/22/2013   **ADDENDUM** CREATED: 05/22/2013 11:12:58  The patient was called by myself, Dr. Micheline Maze today.  The patient states she is doing well since her left axillary node biopsy without problems at the biopsy site.  The patient was given the results of her biopsy which was compatible with metastatic invasive mammary carcinoma.  This is concordant with imaging findings and patient's known history of recent positive biopsy of a large inner upper left breast mass demonstrating invasive mammary carcinoma. The patient had her breast MRI this morning and will receive Port-A- Cath placement by Dr. Dwain Sarna this afternoon.  Recommend follow- up pending MRI results and per treatment plan.  **END ADDENDUM** SIGNED BY: Melton Alar. Micheline Maze, M.D.  05/21/2013   *RADIOLOGY REPORT*  Clinical Data:  New diagnosis of invasive mammary carcinoma in the left upper inner quadrant.  Abnormal lymph node noted on ultrasound dated 05/08/2013.  ULTRASOUND GUIDED CORE BIOPSY OF THE LEFT AXILLA  The patient and I discussed the procedure of ultrasound-guided biopsy, including benefits and alternatives.  We discussed the high likelihood of a successful procedure. We discussed the risks of the procedure, including infection, bleeding, tissue injury, clip migration, and inadequate sampling.  Written informed consent was given. Appropriate time-out was performed.  Using sterile technique, 2% lidocaine, ultrasound guidance, and a 14 gauge automated biopsy device, biopsy was performed of the abnormal left axillary lymph node using a lateromedial approach. At the conclusion of the procedure, a dumbbell shaped tissue marker clip was deployed into the biopsy cavity. Post biopsy mammogram was not performed.  IMPRESSION:  Ultrasound guided biopsy of an abnormal left axillary lymph node. No apparent complications.  Original Report Authenticated By: Cain Saupe, M.D.    ASSESSMENT:   1. Patient is a 42 year old female with high grade invasive breast carcinoma ER 14%, PR negative, HER-2/neu negative.  She was evaluated by Dr. Dwain Sarna.  She will undergo neoadjuvant chemotherapy with dose dense Adriamycin/Cytoxan x 4 cycles followed by weekly Taxol/Carbo x 12 weeks.  She has underwent port placement and chemotherapy class.    #2 patient had CT and PET scan performed. She has no evidence of metastatic disease distantly.  #3 she was begun on neoadjuvant chemotherapy consisting of Adriamycin Cytoxan given Q2 weekly beginning on 06/08/2013. Overall she is tolerating this well.  #4 anemia due to menorrhagia. She is now status post therapy heme given on 06/13/2013.  #5 pain  #6 insomnia   PLAN:  1. Patient is doing well. Labs are stable.  She will proceed with chemotherapy.  We discussed her new regimen in detail.  2.She will take Kdur TID until her appointment next week.    3. She will continue on Lovenox therapy.  I discussed using her port with Dr. Dwain Sarna.  Since the ultrasound essentially shows resolution of the clot, and the port is functional, she will remain on lovenox therapy and we will continue to use the port through chemo.    4. She will return in 1 week for labs, appt, and chemotherapy.    All questions were answered. The patient knows to call the clinic with any problems, questions or concerns. We can certainly see the patient much sooner if necessary.  I spent 25 minutes counseling the patient face to face. The total time spent in the appointment was 30 minutes.  Cherie Ouch Lyn Hollingshead, NP Medical Oncology [facility] Phone: [phone] 07/21/2013, 8:53 AM

## 2013-07-20 NOTE — Patient Instructions (Addendum)
Blepharitis Blepharitis is redness, soreness, and swelling (inflammation) of one or both eyelids. It may be caused by an allergic reaction or a bacterial infection. Blepharitis may also be associated with reddened, scaly skin (seborrhea) of the scalp and eyebrows. While you sleep, eye discharge may cause your eyelashes to stick together. Your eyelids may itch, burn, swell, and may lose their lashes. These will grow back. Your eyes may become sensitive. Blepharitis may recur and need repeated treatment. If this is the case, you may require further evaluation by an eye specialist (ophthalmologist). HOME CARE INSTRUCTIONS   Keep your hands clean.  Use a clean towel each time you dry your eyelids. Do not use this towel to clean other areas. Do not share a towel or makeup with anyone.  Wash your eyelids with warm water or warm water mixed with a small amount of baby shampoo. Do this twice a day or as often as needed.  Wash your face and eyebrows at least once a day.  Use warm compresses 2 times a day for 10 minutes at a time, or as directed by your caregiver.  Apply antibiotic ointment as directed by your caregiver.  Avoid rubbing your eyes.  Avoid wearing makeup until you get better.  Follow up with your caregiver as directed. SEEK IMMEDIATE MEDICAL CARE IF:   You have pain, redness, or swelling that gets worse or spreads to other parts of your face.  Your vision changes, or you have pain when looking at lights or moving objects.  You have a fever.  Your symptoms continue for longer than 2 to 4 days or become worse. MAKE SURE YOU:   Understand these instructions.  Will watch your condition.  Will get help right away if you are not doing well or get worse. Document Released: 11/26/2000 Document Revised: 02/21/2012 Document Reviewed: 01/06/2011 [employer] Patient Information 2014 [employer].  

## 2013-07-22 NOTE — Progress Notes (Signed)
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    PRIOR THERAPY: 1. 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 2 months ago this began getting larger rapidly. She was seen in an urgent care where this mass was thought to be an abscess. Apparently there was an attempt to excise or drain this that was not successful and she was referred to Dr. Dwain Sarna for evaluation. She underwent punch biopsy on 04/23/13 of her skin and the mass. She was then also sent for mm/us. The biopsy shows invasive high grade carcinoma that appears to be breast. It is ER 14%, PR negative, HER-2/neu negative with a Ki-67 that approaches 100%. She has also undergone mm with at least 10 cm upper inner left breast tumor with mild overyling skin thickening. There is also a 3x10 mm cluster of calcs near there also. There is indeterminate 7 mm level 1 left axillary node, she underwent a lymph node biopsy that was positive for disease. No mm evidence of right breast malignancy.  An MRI of the breasts was done on 05/22/13 and showed a 14 cm mass in the left breast protruding from the skin and also a 2.5 cm level II axillary node.   2.  Patient evaluated and began on neoadjuvant chemotherapy.  Adriamycin and Cytoxan began on 05/25/13- 07/06/13.    #3 staging PET/CT performed on 06/13/2013 revealed no evidence of distant disease.  #4 anemia of iron deficiency  #5 patient will begin neoadjuvant Taxol carboplatinum starting 07/20/2013.Total of 12 cycles is planned.  CURRENT THERAPY: cycle 4 day 8 of Adriamycin and Cytoxan neoadjuvant  INTERVAL HISTORY: Erika [name] 42 y.o. female returns for f/u today. She is doing well.  Her tumor is much decreased.  She has been receiving lovenox for a blood clot in her right upper  extremity.  She is tolerating the injections without difficulty.  She has no easy bruising/bleeding.  She has mild numbness in her fingertips and toes.  Otherwise, a 10 point ROS is negative.   Marland Kitchen MEDICAL HISTORY: Past Medical History  Diagnosis Date  . Breast cancer     Invasive High Grade Carcinoma  . Headache(784.0)     Hx: of Migraines  . Status post chemotherapy     Adriamycin and Cytoxan    ALLERGIES:  has No Known Allergies.  MEDICATIONS:  Current Outpatient Prescriptions  Medication Sig Dispense Refill  . dexamethasone (DECADRON) 4 MG tablet Take 8 mg by mouth See admin instructions. Take daily for 3 days starting day after chemo      . enoxaparin (LOVENOX) 100 MG/ML injection Inject 1.5 mLs (150 mg total) into the skin daily.  20 mL  0  . fentaNYL (DURAGESIC - DOSED MCG/HR) 25 MCG/HR Place 1 patch (25 mcg total) onto the skin every 3 (three) days.  5 patch  0  . ibuprofen (ADVIL,MOTRIN) 200 MG tablet Take 600-800 mg by mouth every 4 (four) hours as needed for pain.       Marland Kitchen lidocaine-prilocaine (EMLA) cream Apply 1 application topically every 14 (fourteen) days. Every other Friday prior to chemo      . oxyCODONE (OXY IR/ROXICODONE) 5 MG immediate release tablet Take 1 tablet (5 mg total) by mouth every 4 (four) hours as needed for pain.  30  tablet  0  . potassium chloride SA (K-DUR,KLOR-CON) 20 MEQ tablet Take 1 tablet (20 mEq total) by mouth 3 (three) times daily.  90 tablet  0  . zolpidem (AMBIEN) 5 MG tablet Take 1 tablet (5 mg total) by mouth at bedtime as needed for sleep.  30 tablet  0  . ciprofloxacin (CIPRO) 500 MG tablet Take 1 tablet (500 mg total) by mouth 2 (two) times daily.  14 tablet  0  . dexamethasone (DECADRON) 4 MG tablet Take 2 tablets (8 mg total) by mouth 2 (two) times daily with a meal. Take two times a day starting the day after chemotherapy for 3 days.  30 tablet  1  . HYDROcodone-acetaminophen (NORCO/VICODIN) 5-325 MG per tablet Take 2 tablets by mouth  every 8 (eight) hours as needed for pain.  30 tablet  0  . LORazepam (ATIVAN) 0.5 MG tablet Take 1 tablet (0.5 mg total) by mouth every 6 (six) hours as needed (Nausea or vomiting).  30 tablet  0  . ondansetron (ZOFRAN) 8 MG tablet Take 1 tablet (8 mg total) by mouth 2 (two) times daily. Take two times a day starting the day after chemo for 3 days. Then take two times a day as needed for nausea or vomiting.  30 tablet  1  . prochlorperazine (COMPAZINE) 10 MG tablet Take 1 tablet (10 mg total) by mouth every 6 (six) hours as needed (Nausea or vomiting).  30 tablet  1   Current Facility-Administered Medications  Medication Dose Route Frequency Provider Last Rate Last Dose  . sodium chloride 0.9 % injection 10 mL  10 mL Intravenous PRN Victorino December, MD   10 mL at 07/13/13 1132    SURGICAL HISTORY:  Past Surgical History  Procedure Laterality Date  . Tubal ligation    . Portacath placement Right 05/22/2013    Procedure: INSERTION PORT-A-CATH;  Surgeon: Emelia Loron, MD;  Location: [facility];  Service: General;  Laterality: Right;  . Left breast needle core biopsy Left 05/21/13  . Left breast needle core  biopsy Left 06/22/13    REVIEW OF SYSTEMS:  General: fatigue (+), night sweats (-), fever (-), pain (+) Lymph: palpable nodes (-) HEENT: vision changes (-), mucositis (-), gum bleeding (-), epistaxis (-) Cardiovascular: chest pain (-), palpitations (-) Pulmonary: shortness of breath (-), dyspnea on exertion (-), cough (-), hemoptysis (-) GI:  Early satiety (-), melena (-), dysphagia (-), nausea/vomiting (-), diarrhea (-) GU: dysuria (-), hematuria (-), incontinence (-) Musculoskeletal: joint swelling (-), joint pain (-), back pain (-) Neuro: weakness (-), numbness (-), headache (-), confusion (-) Skin: Rash (-), lesions (-), dryness (-) Psych: depression (-), suicidal/homicidal ideation (-), feeling of hopelessness (-)    PHYSICAL EXAMINATION: Blood pressure 111/74, pulse 101,  temperature 98.4 F (36.9 C), temperature source Oral, resp. rate 20, height 5' 5.5" (1.664 m), weight 224 lb 11.2 oz (101.923 kg), last menstrual period 05/30/2013. Body mass index is 36.81 kg/(m^2). General: Patient is a well appearing female in no acute distress HEENT: PERRLA, sclerae anicteric no conjunctival pallor, MMM Neck: supple, no palpable adenopathy Lungs: clear to auscultation bilaterally, no wheezes, rhonchi, or rales Cardiovascular: regular rate rhythm, S1, S2, no murmurs, rubs or gallops Abdomen: Soft, non-tender, non-distended, normoactive bowel sounds, no HSM Extremities: warm and well perfused, no clubbing, cyanosis, or edema Skin: No rashes or lesions Neuro: Non-focal Breasts: left breast with large mass resolving from protruding from the skin, about 3cm, covered with a bandage [employer]  STATUS: 1 - Symptomatic but completely ambulatory  LABORATORY DATA: Lab Results  Component Value Date   WBC 7.7 07/20/2013   HGB 8.4* 07/20/2013   HCT 27.4* 07/20/2013   MCV 78.5* 07/20/2013   PLT 256 07/20/2013      Chemistry      Component Value Date/Time   NA 140 07/20/2013 1109   NA 139 05/18/2013 1205   K 3.9 07/20/2013 1109   K 3.8 05/18/2013 1205   CL 102 06/01/2013 1500   CL 103 05/18/2013 1205   CO2 25 07/20/2013 1109   CO2 28 05/18/2013 1205   BUN 5.4* 07/20/2013 1109   BUN 8 05/18/2013 1205   CREATININE 0.8 07/20/2013 1109   CREATININE 0.84 05/18/2013 1205      Component Value Date/Time   CALCIUM 9.6 07/20/2013 1109   CALCIUM 9.1 05/18/2013 1205   ALKPHOS 117 07/20/2013 1109   ALKPHOS 111 05/18/2013 1205   AST 14 07/20/2013 1109   AST 18 05/18/2013 1205   ALT 13 07/20/2013 1109   ALT 16 05/18/2013 1205   BILITOT <0.20 07/20/2013 1109   BILITOT 0.2* 05/18/2013 1205       RADIOGRAPHIC STUDIES:  Chest 2 View  05/18/2013   *RADIOLOGY REPORT*  Clinical Data: Breast carcinoma  CHEST - 2 VIEW  Comparison: None.  Findings: Probable left breast mass. Lungs clear.  Heart size and pulmonary vascularity  normal.  No effusion.  Visualized bones unremarkable.  IMPRESSION: No acute disease   Original Report Authenticated By: D. Andria Rhein, MD   US Breast Left  05/08/2013   *RADIOLOGY REPORT*  Clinical Data:  42 year old female with large left breast mass - recent punch biopsy demonstrating high-grade carcinoma.  DIGITAL DIAGNOSTIC BILATERAL MAMMOGRAM WITH CAD AND LEFT BREAST ULTRASOUND:  Comparison:  None.  Findings:  ACR Breast Density Category 2: There is a scattered fibroglandular pattern.  A large irregular mass within the upper inner posterior left breast is identified - measuring at least 10 cm. Mild overlying skin thickening is identified compatible with dermal invasion. A 3 x 10 mm cluster of slightly heterogeneous left breast calcifications approximate 4 cm anterior and lateral to this mass is identified.  There is no evidence of suspicious mass, distortion or worrisome calcifications within the right breast. Mammographic images were processed with CAD.  On physical exam, a very large protuberant mass within the upper inner left breast is identified.  Ultrasound is performed, showing a large heterogeneous mass within the upper inner left breast at the 10-11 o'clock position, measuring at least 6.5 cm, but evaluation is limited by transducer skin area and protuberant portion of the mass. A 7 mm level I left axillary lymph node is identified with slightly echogenic rim - suspicious.  IMPRESSION: Large biopsy-proven carcinoma within the upper inner left breast, measuring at least 10 cm mammographically. Mild overlying skin thickening compatible with dermal invasion.  3 x 10 mm cluster of indeterminate left breast calcifications 4 cm anterior and lateral to the biopsy-proven neoplasm.  This may represent DCIS and consider tissue sampling.  Indeterminate 7 mm level I left axillary lymph node - metastatic disease not excluded.  No mammographic evidence of right breast malignancy.  BI-RADS CATEGORY 6:  Known  biopsy-proven malignancy - appropriate action should be taken.  RECOMMENDATION: Treatment plan.  Consider biopsies of the indeterminate left axillary lymph node and left breast calcifications as clinically indicated.  Consider bilateral breast MRI as clinically indicated.  I have discussed the findings and recommendations with  the patient. Results were also provided in writing at the conclusion of the visit.   Original Report Authenticated By: Harmon Pier, M.D.   Mr Breast Bilateral W Wo Contrast  05/22/2013   *RADIOLOGY REPORT*  Clinical Data: large invasive carcinoma left upper inner quadrant with dermal involvement  BILATERAL BREAST MRI WITH AND WITHOUT CONTRAST  Technique: Multiplanar, multisequence MR images of both breasts were obtained prior to and following the intravenous administration of 20ml of Multihance.  Three dimensional images were evaluated at the independent DynaCad workstation.  Comparison:  05/08/13  Findings: There is moderate background parenchymal enhancement. The right breast and axilla are negative.  On the left, there is a large irregular mass with irregular borders in the upper inner quadrant, demonstrating heterogenous internal enhancement.  The mass measures 14 (AP x 7.5 (lateral) x 8 (craniocaudal)cm.  It causes protrusion of the skin of the breast in the 10 - 11 o'clock position, and it is associated with edematous change in the skin of the left breast, primarily in the upper inner quadrant but also in the periareolar in more infra- areolar left breast.  The mass extends through pectoralis major musculature posteriorly, with enhancing mass extending all the way to the posterior edge of the muscle.  In the 2 o'clock position posteriorly there is a 9mm oval enhancing mass with a mixture of plateau and washout kinetics; it is an indeterminate finding but shows the suggestion of a fatty hilum, and it may be an intramammary lymph node.  In the left axilla, there is an enlarged level II  lymph node demonstrating hypervascularity and cortical thickening.  It measures 2.5cm in greatest dimension, and the appearance is concerning for metastasis.  There are no other abnormalities on the left.  IMPRESSION: Large left breast malignancy associated with involvement of the dermis and the chest wall, as well as with probable axillary lymph node metastasis.  There is an indeterminate 9mm mass in the 2 o'clock position, that may be an intramammary lymph node.  BI-RADS CATEGORY 6:  Known biopsy-proven malignancy - appropriate action should be taken.  THREE-DIMENSIONAL MR IMAGE RENDERING ON INDEPENDENT WORKSTATION:  Three-dimensional MR images were rendered by post-processing of the original MR data on an independent workstation.  The three- dimensional MR images were interpreted, and findings were reported in the accompanying complete MRI report for this study.   Original Report Authenticated By: Esperanza Heir, M.D.   Dg Chest Port 1 View  05/22/2013   *RADIOLOGY REPORT*  Clinical Data: Port-A-Cath placement.  PORTABLE CHEST - 1 VIEW  Comparison: 05/18/2013  Findings: Right-sided power injectable Port-A-Cath noted with distal tip projecting over the SVC.  Low lung volumes are present, causing crowding of the pulmonary vasculature.  No pneumothorax noted.  Bilateral midlung subsegmental atelectasis noted.  Borderline cardiomegaly.  IMPRESSION:  1.  Power injectable catheter tip:  SVC.  No pneumothorax.   Original Report Authenticated By: Gaylyn Rong, M.D.   Mm Digital Diagnostic Bilat  05/08/2013   *RADIOLOGY REPORT*  Clinical Data:  42 year old female with large left breast mass - recent punch biopsy demonstrating high-grade carcinoma.  DIGITAL DIAGNOSTIC BILATERAL MAMMOGRAM WITH CAD AND LEFT BREAST ULTRASOUND:  Comparison:  None.  Findings:  ACR Breast Density Category 2: There is a scattered fibroglandular pattern.  A large irregular mass within the upper inner posterior left breast is identified  - measuring at least 10 cm. Mild overlying skin thickening is identified compatible with dermal invasion. A 3 x 10 mm cluster of slightly  heterogeneous left breast calcifications approximate 4 cm anterior and lateral to this mass is identified.  There is no evidence of suspicious mass, distortion or worrisome calcifications within the right breast. Mammographic images were processed with CAD.  On physical exam, a very large protuberant mass within the upper inner left breast is identified.  Ultrasound is performed, showing a large heterogeneous mass within the upper inner left breast at the 10-11 o'clock position, measuring at least 6.5 cm, but evaluation is limited by transducer skin area and protuberant portion of the mass. A 7 mm level I left axillary lymph node is identified with slightly echogenic rim - suspicious.  IMPRESSION: Large biopsy-proven carcinoma within the upper inner left breast, measuring at least 10 cm mammographically. Mild overlying skin thickening compatible with dermal invasion.  3 x 10 mm cluster of indeterminate left breast calcifications 4 cm anterior and lateral to the biopsy-proven neoplasm.  This may represent DCIS and consider tissue sampling.  Indeterminate 7 mm level I left axillary lymph node - metastatic disease not excluded.  No mammographic evidence of right breast malignancy.  BI-RADS CATEGORY 6:  Known biopsy-proven malignancy - appropriate action should be taken.  RECOMMENDATION: Treatment plan.  Consider biopsies of the indeterminate left axillary lymph node and left breast calcifications as clinically indicated.  Consider bilateral breast MRI as clinically indicated.  I have discussed the findings and recommendations with the patient. Results were also provided in writing at the conclusion of the visit.   Original Report Authenticated By: Harmon Pier, M.D.   Dg Fluoro Guide Cv Line-no Report  05/22/2013   CLINICAL DATA: surgery   FLOURO GUIDE CV LINE  Fluoroscopy was  utilized by the requesting physician.  No radiographic  interpretation.    [place] Lt Breast Bx W Loc Dev 1st Lesion Img Bx Spec US Guide  05/22/2013   **ADDENDUM** CREATED: 05/22/2013 11:12:58  The patient was called by myself, Dr. Micheline Maze today.  The patient states she is doing well since her left axillary node biopsy without problems at the biopsy site.  The patient was given the results of her biopsy which was compatible with metastatic invasive mammary carcinoma.  This is concordant with imaging findings and patient's known history of recent positive biopsy of a large inner upper left breast mass demonstrating invasive mammary carcinoma. The patient had her breast MRI this morning and will receive Port-A- Cath placement by Dr. Dwain Sarna this afternoon.  Recommend follow- up pending MRI results and per treatment plan.  **END ADDENDUM** SIGNED BY: Melton Alar. Micheline Maze, M.D.  05/21/2013   *RADIOLOGY REPORT*  Clinical Data:  New diagnosis of invasive mammary carcinoma in the left upper inner quadrant.  Abnormal lymph node noted on ultrasound dated 05/08/2013.  ULTRASOUND GUIDED CORE BIOPSY OF THE LEFT AXILLA  The patient and I discussed the procedure of ultrasound-guided biopsy, including benefits and alternatives.  We discussed the high likelihood of a successful procedure. We discussed the risks of the procedure, including infection, bleeding, tissue injury, clip migration, and inadequate sampling.  Written informed consent was given. Appropriate time-out was performed.  Using sterile technique, 2% lidocaine, ultrasound guidance, and a 14 gauge automated biopsy device, biopsy was performed of the abnormal left axillary lymph node using a lateromedial approach. At the conclusion of the procedure, a dumbbell shaped tissue marker clip was deployed into the biopsy cavity. Post biopsy mammogram was not performed.  IMPRESSION: Ultrasound guided biopsy of an abnormal left axillary lymph node. No apparent complications.  Original  Report  Authenticated By: Cain Saupe, M.D.    ASSESSMENT:   1. Patient is a 42 year old female with high grade invasive breast carcinoma ER 14%, PR negative, HER-2/neu negative.  She was evaluated by Dr. Dwain Sarna.  She will undergo neoadjuvant chemotherapy with dose dense Adriamycin/Cytoxan x 4 cycles followed by weekly Taxol/Carbo x 12 weeks.  She has underwent port placement and chemotherapy class.    #2 patient had CT and PET scan performed. She has no evidence of metastatic disease distantly.  #3 she was begun on neoadjuvant chemotherapy consisting of Adriamycin Cytoxan given Q2 weekly beginning on 06/08/2013-07/06/13. Overall she is tolerating this well.  #4 anemia due to menorrhagia. She is now status post therapy heme given on 06/13/2013.  #5 pain  #6 insomnia  #7 beginning on 07/20/2013 patient will begin neoadjuvant Taxol carboplatinum weekly for a total of 12 weeks.   PLAN:  #1 overall patient is doing well  #2 she will return in one week's time to begin Taxol carboplatinum.      All questions were answered. The patient knows to call the clinic with any problems, questions or concerns. We can certainly see the patient much sooner if necessary.  I spent 25 minutes counseling the patient face to face. The total time spent in the appointment was 30 minutes.

## 2013-07-23 ENCOUNTER — Telehealth: Payer: Self-pay | Admitting: *Deleted

## 2013-07-23 NOTE — Telephone Encounter (Signed)
Called Her[facility] Grays for chemotherapy F/U.  Patient is c/o being more nauseated with this regimen than she was with the A/C.  Also says she is having trouble sleeping.  Following chemotherapy she is to take dexamethasone x three days.  Taking these steroids early in the day.    Otherwise she is doing well.  Denies vomiting or emesis.  Denies any new side effects or symptoms.  Bowel and bladder is functioning well.  Eating and drinking well and I instructed to drink 64 oz minimum daily or at least the day before, of and after treatment.  Denies questions at this time and encouraged to call if needed.  Reviewed how to call after hours in the case of an emergency.

## 2013-07-23 NOTE — Telephone Encounter (Signed)
Message copied by Augusto Garbe on Mon Jul 23, 2013  4:44 PM ------      Message from: Margaretha Seeds      Created: Fri Jul 20, 2013  1:37 PM      Regarding: Chemo f/u call       First time Taxol and Carboplatin-Dr. Welton Flakes ------

## 2013-07-27 ENCOUNTER — Other Ambulatory Visit: Payer: Self-pay | Admitting: *Deleted

## 2013-07-27 ENCOUNTER — Telehealth: Payer: Self-pay | Admitting: *Deleted

## 2013-07-27 ENCOUNTER — Ambulatory Visit: Payer: No Typology Code available for payment source

## 2013-07-27 ENCOUNTER — Other Ambulatory Visit (HOSPITAL_BASED_OUTPATIENT_CLINIC_OR_DEPARTMENT_OTHER): Payer: No Typology Code available for payment source | Admitting: Lab

## 2013-07-27 ENCOUNTER — Encounter: Payer: Self-pay | Admitting: Adult Health

## 2013-07-27 ENCOUNTER — Ambulatory Visit (HOSPITAL_BASED_OUTPATIENT_CLINIC_OR_DEPARTMENT_OTHER): Payer: No Typology Code available for payment source | Admitting: Adult Health

## 2013-07-27 ENCOUNTER — Encounter: Payer: Self-pay | Admitting: Oncology

## 2013-07-27 VITALS — BP 118/87 | HR 111 | Temp 98.6°F | Resp 20 | Ht 65.5 in | Wt 215.9 lb

## 2013-07-27 DIAGNOSIS — C50212 Malignant neoplasm of upper-inner quadrant of left female breast: Secondary | ICD-10-CM

## 2013-07-27 DIAGNOSIS — C50219 Malignant neoplasm of upper-inner quadrant of unspecified female breast: Secondary | ICD-10-CM

## 2013-07-27 DIAGNOSIS — C50919 Malignant neoplasm of unspecified site of unspecified female breast: Secondary | ICD-10-CM

## 2013-07-27 LAB — CBC WITH DIFFERENTIAL/PLATELET
Eosinophils Absolute: 0 10*3/uL (ref 0.0–0.5)
HCT: 28.1 % — ABNORMAL LOW (ref 34.8–46.6)
LYMPH%: 14.2 % (ref 14.0–49.7)
MCHC: 31.7 g/dL (ref 31.5–36.0)
MCV: 78.5 fL — ABNORMAL LOW (ref 79.5–101.0)
MONO#: 0.3 10*3/uL (ref 0.1–0.9)
MONO%: 5.4 % (ref 0.0–14.0)
NEUT#: 4.7 10*3/uL (ref 1.5–6.5)
NEUT%: 79.8 % — ABNORMAL HIGH (ref 38.4–76.8)
Platelets: 559 10*3/uL — ABNORMAL HIGH (ref 145–400)
WBC: 5.9 10*3/uL (ref 3.9–10.3)

## 2013-07-27 MED ORDER — OXYCODONE HCL 5 MG PO TABS: 5.0000 mg | ORAL_TABLET | ORAL | Status: DC | PRN

## 2013-07-27 MED ORDER — FENTANYL 25 MCG/HR TD PT72: 2.0000 | MEDICATED_PATCH | TRANSDERMAL | Status: DC

## 2013-07-27 NOTE — Progress Notes (Signed)
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    PRIOR THERAPY: 1. 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 2 months ago this began getting larger rapidly. She was seen in an urgent care where this mass was thought to be an abscess. Apparently there was an attempt to excise or drain this that was not successful and she was referred to Dr. Dwain Sarna for evaluation. She underwent punch biopsy on 04/23/13 of her skin and the mass. She was then also sent for mm/us. The biopsy shows invasive high grade carcinoma that appears to be breast. It is ER 14%, PR negative, HER-2/neu negative with a Ki-67 that approaches 100%. She has also undergone mm with at least 10 cm upper inner left breast tumor with mild overyling skin thickening. There is also a 3x10 mm cluster of calcs near there also. There is indeterminate 7 mm level 1 left axillary node, she underwent a lymph node biopsy that was positive for disease. No mm evidence of right breast malignancy.  An MRI of the breasts was done on 05/22/13 and showed a 14 cm mass in the left breast protruding from the skin and also a 2.5 cm level II axillary node.   2.  Patient evaluated and began on neoadjuvant chemotherapy.  Adriamycin and Cytoxan began on 05/25/13.  Taxol carbo began on 8/8. 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  #3 staging PET/CT performed on 06/13/2013 revealed no evidence of distant disease.  #4 anemia of iron deficiency  CURRENT THERAPY: Taxol/Carbo week 2  INTERVAL HISTORY: Jamoni Broadfoot 42 y.o. female returns for f/u today. She is having generalized aches and pains that are constant in nature.  She is on a Fentanyl patch that she takes every three days, and she was prescribed  Oxycodone that seems to only work for one hour.  She does have numbness in the fingertips and toes.  She reports that she has difficulty with buttoning, and opening jars.  Otherwise, a 10 point ROS is negative.     Marland Kitchen MEDICAL HISTORY: Past Medical History  Diagnosis Date  . Breast cancer     Invasive High Grade Carcinoma  . Headache(784.0)     Hx: of Migraines  . Status post chemotherapy     Adriamycin and Cytoxan    ALLERGIES:  has No Known Allergies.  MEDICATIONS:  Current Outpatient Prescriptions  Medication Sig Dispense Refill  . ciprofloxacin (CIPRO) 500 MG tablet Take 1 tablet (500 mg total) by mouth 2 (two) times daily.  14 tablet  0  . dexamethasone (DECADRON) 4 MG tablet Take 8 mg by mouth See admin instructions. Take daily for 3 days starting day after chemo      . dexamethasone (DECADRON) 4 MG tablet Take 2 tablets (8 mg total) by mouth 2 (two) times daily with a meal. Take two times a day starting the day after chemotherapy for 3 days.  30 tablet  1  . enoxaparin (LOVENOX) 100 MG/ML injection Inject 1.5 mLs (150 mg total) into the skin daily.  20 mL  0  . fentaNYL (DURAGESIC - DOSED MCG/HR) 25 MCG/HR Place 1 patch (25 mcg total) onto the skin every 3 (three) days.  5 patch  0  . HYDROcodone-acetaminophen (NORCO/VICODIN)  5-325 MG per tablet Take 2 tablets by mouth every 8 (eight) hours as needed for pain.  30 tablet  0  . ibuprofen (ADVIL,MOTRIN) 200 MG tablet Take 600-800 mg by mouth every 4 (four) hours as needed for pain.       Marland Kitchen lidocaine-prilocaine (EMLA) cream Apply 1 application topically every 14 (fourteen) days. Every other Friday prior to chemo      . LORazepam (ATIVAN) 0.5 MG tablet Take 1 tablet (0.5 mg total) by mouth every 6 (six) hours as needed (Nausea or vomiting).  30 tablet  0  . ondansetron (ZOFRAN) 8 MG tablet Take 1 tablet (8 mg total) by mouth 2 (two) times daily. Take two times a day starting the day after chemo for 3 days. Then take two times a day as  needed for nausea or vomiting.  30 tablet  1  . oxyCODONE (OXY IR/ROXICODONE) 5 MG immediate release tablet Take 1 tablet (5 mg total) by mouth every 4 (four) hours as needed for pain.  30 tablet  0  . potassium chloride SA (K-DUR,KLOR-CON) 20 MEQ tablet Take 1 tablet (20 mEq total) by mouth 3 (three) times daily.  90 tablet  0  . prochlorperazine (COMPAZINE) 10 MG tablet Take 1 tablet (10 mg total) by mouth every 6 (six) hours as needed (Nausea or vomiting).  30 tablet  1  . zolpidem (AMBIEN) 5 MG tablet Take 1 tablet (5 mg total) by mouth at bedtime as needed for sleep.  30 tablet  0   No current facility-administered medications for this visit.    SURGICAL HISTORY:  Past Surgical History  Procedure Laterality Date  . Tubal ligation    . Portacath placement Right 05/22/2013    Procedure: INSERTION PORT-A-CATH;  Surgeon: Emelia Loron, MD;  Location: [facility] OR;  Service: General;  Laterality: Right;  . Left breast needle core biopsy Left 05/21/13  . Left breast needle core  biopsy Left 06/22/13    REVIEW OF SYSTEMS:  General: fatigue (+), night sweats (-), fever (-), pain (+) Lymph: palpable nodes (-) HEENT: vision changes (-), mucositis (-), gum bleeding (-), epistaxis (-) Cardiovascular: chest pain (-), palpitations (-) Pulmonary: shortness of breath (-), dyspnea on exertion (-), cough (-), hemoptysis (-) GI:  Early satiety (-), melena (-), dysphagia (-), nausea/vomiting (-), diarrhea (-) GU: dysuria (-), hematuria (-), incontinence (-) Musculoskeletal: joint swelling (-), joint pain (-), back pain (-) Neuro: weakness (-), numbness (-), headache (-), confusion (-) Skin: Rash (-), lesions (-), dryness (-) Psych: depression (-), suicidal/homicidal ideation (-), feeling of hopelessness (-)    PHYSICAL EXAMINATION: Blood pressure 118/87, pulse 111, temperature 98.6 F (37 C), temperature source Oral, resp. rate 20, height 5' 5.5" (1.664 m), weight 215 lb 14.4 oz (97.932 kg). Body mass  index is 35.37 kg/(m^2). General: Patient is a well appearing female in no acute distress HEENT: PERRLA, sclerae anicteric no conjunctival pallor, MMM Neck: supple, no palpable adenopathy Lungs: clear to auscultation bilaterally, no wheezes, rhonchi, or rales Cardiovascular: regular rate rhythm, S1, S2, no murmurs, rubs or gallops Abdomen: Soft, non-tender, non-distended, normoactive bowel sounds, no HSM Extremities: warm and well perfused, no clubbing, cyanosis, or edema Skin: No rashes or lesions Neuro: Non-focal Breasts: left breast mass is much smaller, now hardly palpable, skin change from where mass ulcerated is evident with mild thickness, however lesion is much improved.  ECOG PERFORMANCE STATUS: 1 - Symptomatic but completely ambulatory  LABORATORY DATA: Lab Results  Component Value Date  WBC 5.9 07/27/2013   HGB 8.9* 07/27/2013   HCT 28.1* 07/27/2013   MCV 78.5* 07/27/2013   PLT 559* 07/27/2013      Chemistry      Component Value Date/Time   NA 140 07/20/2013 1109   NA 139 05/18/2013 1205   K 3.9 07/20/2013 1109   K 3.8 05/18/2013 1205   CL 102 06/01/2013 1500   CL 103 05/18/2013 1205   CO2 25 07/20/2013 1109   CO2 28 05/18/2013 1205   BUN 5.4* 07/20/2013 1109   BUN 8 05/18/2013 1205   CREATININE 0.8 07/20/2013 1109   CREATININE 0.84 05/18/2013 1205      Component Value Date/Time   CALCIUM 9.6 07/20/2013 1109   CALCIUM 9.1 05/18/2013 1205   ALKPHOS 117 07/20/2013 1109   ALKPHOS 111 05/18/2013 1205   AST 14 07/20/2013 1109   AST 18 05/18/2013 1205   ALT 13 07/20/2013 1109   ALT 16 05/18/2013 1205   BILITOT <0.20 07/20/2013 1109   BILITOT 0.2* 05/18/2013 1205       RADIOGRAPHIC STUDIES:  Chest 2 View  05/18/2013   *RADIOLOGY REPORT*  Clinical Data: Breast carcinoma  CHEST - 2 VIEW  Comparison: None.  Findings: Probable left breast mass. Lungs clear.  Heart size and pulmonary vascularity normal.  No effusion.  Visualized bones unremarkable.  IMPRESSION: No acute disease   Original Report  Authenticated By: D. Andria Rhein, MD   US Breast Left  05/08/2013   *RADIOLOGY REPORT*  Clinical Data:  42 year old female with large left breast mass - recent punch biopsy demonstrating high-grade carcinoma.  DIGITAL DIAGNOSTIC BILATERAL MAMMOGRAM WITH CAD AND LEFT BREAST ULTRASOUND:  Comparison:  None.  Findings:  ACR Breast Density Category 2: There is a scattered fibroglandular pattern.  A large irregular mass within the upper inner posterior left breast is identified - measuring at least 10 cm. Mild overlying skin thickening is identified compatible with dermal invasion. A 3 x 10 mm cluster of slightly heterogeneous left breast calcifications approximate 4 cm anterior and lateral to this mass is identified.  There is no evidence of suspicious mass, distortion or worrisome calcifications within the right breast. Mammographic images were processed with CAD.  On physical exam, a very large protuberant mass within the upper inner left breast is identified.  Ultrasound is performed, showing a large heterogeneous mass within the upper inner left breast at the 10-11 o'clock position, measuring at least 6.5 cm, but evaluation is limited by transducer skin area and protuberant portion of the mass. A 7 mm level I left axillary lymph node is identified with slightly echogenic rim - suspicious.  IMPRESSION: Large biopsy-proven carcinoma within the upper inner left breast, measuring at least 10 cm mammographically. Mild overlying skin thickening compatible with dermal invasion.  3 x 10 mm cluster of indeterminate left breast calcifications 4 cm anterior and lateral to the biopsy-proven neoplasm.  This may represent DCIS and consider tissue sampling.  Indeterminate 7 mm level I left axillary lymph node - metastatic disease not excluded.  No mammographic evidence of right breast malignancy.  BI-RADS CATEGORY 6:  Known biopsy-proven malignancy - appropriate action should be taken.  RECOMMENDATION: Treatment plan.  Consider  biopsies of the indeterminate left axillary lymph node and left breast calcifications as clinically indicated.  Consider bilateral breast MRI as clinically indicated.  I have discussed the findings and recommendations with the patient. Results were also provided in writing at the conclusion of the visit.   Original Report  Authenticated By: Harmon Pier, M.D.   Mr Breast Bilateral W Wo Contrast  05/22/2013   *RADIOLOGY REPORT*  Clinical Data: large invasive carcinoma left upper inner quadrant with dermal involvement  BILATERAL BREAST MRI WITH AND WITHOUT CONTRAST  Technique: Multiplanar, multisequence MR images of both breasts were obtained prior to and following the intravenous administration of 20ml of Multihance.  Three dimensional images were evaluated at the independent DynaCad workstation.  Comparison:  05/08/13  Findings: There is moderate background parenchymal enhancement. The right breast and axilla are negative.  On the left, there is a large irregular mass with irregular borders in the upper inner quadrant, demonstrating heterogenous internal enhancement.  The mass measures 14 (AP x 7.5 (lateral) x 8 (craniocaudal)cm.  It causes protrusion of the skin of the breast in the 10 - 11 o'clock position, and it is associated with edematous change in the skin of the left breast, primarily in the upper inner quadrant but also in the periareolar in more infra- areolar left breast.  The mass extends through pectoralis major musculature posteriorly, with enhancing mass extending all the way to the posterior edge of the muscle.  In the 2 o'clock position posteriorly there is a 9mm oval enhancing mass with a mixture of plateau and washout kinetics; it is an indeterminate finding but shows the suggestion of a fatty hilum, and it may be an intramammary lymph node.  In the left axilla, there is an enlarged level II lymph node demonstrating hypervascularity and cortical thickening.  It measures 2.5cm in greatest dimension,  and the appearance is concerning for metastasis.  There are no other abnormalities on the left.  IMPRESSION: Large left breast malignancy associated with involvement of the dermis and the chest wall, as well as with probable axillary lymph node metastasis.  There is an indeterminate 9mm mass in the 2 o'clock position, that may be an intramammary lymph node.  BI-RADS CATEGORY 6:  Known biopsy-proven malignancy - appropriate action should be taken.  THREE-DIMENSIONAL MR IMAGE RENDERING ON INDEPENDENT WORKSTATION:  Three-dimensional MR images were rendered by post-processing of the original MR data on an independent workstation.  The three- dimensional MR images were interpreted, and findings were reported in the accompanying complete MRI report for this study.   Original Report Authenticated By: Esperanza Heir, M.D.   Dg Chest Port 1 View  05/22/2013   *RADIOLOGY REPORT*  Clinical Data: Port-A-Cath placement.  PORTABLE CHEST - 1 VIEW  Comparison: 05/18/2013  Findings: Right-sided power injectable Port-A-Cath noted with distal tip projecting over the SVC.  Low lung volumes are present, causing crowding of the pulmonary vasculature.  No pneumothorax noted.  Bilateral midlung subsegmental atelectasis noted.  Borderline cardiomegaly.  IMPRESSION:  1.  Power injectable catheter tip:  SVC.  No pneumothorax.   Original Report Authenticated By: Gaylyn Rong, M.D.   Mm Digital Diagnostic Bilat  05/08/2013   *RADIOLOGY REPORT*  Clinical Data:  42 year old female with large left breast mass - recent punch biopsy demonstrating high-grade carcinoma.  DIGITAL DIAGNOSTIC BILATERAL MAMMOGRAM WITH CAD AND LEFT BREAST ULTRASOUND:  Comparison:  None.  Findings:  ACR Breast Density Category 2: There is a scattered fibroglandular pattern.  A large irregular mass within the upper inner posterior left breast is identified - measuring at least 10 cm. Mild overlying skin thickening is identified compatible with dermal invasion. A 3  x 10 mm cluster of slightly heterogeneous left breast calcifications approximate 4 cm anterior and lateral to this mass is identified.  There is  no evidence of suspicious mass, distortion or worrisome calcifications within the right breast. Mammographic images were processed with CAD.  On physical exam, a very large protuberant mass within the upper inner left breast is identified.  Ultrasound is performed, showing a large heterogeneous mass within the upper inner left breast at the 10-11 o'clock position, measuring at least 6.5 cm, but evaluation is limited by transducer skin area and protuberant portion of the mass. A 7 mm level I left axillary lymph node is identified with slightly echogenic rim - suspicious.  IMPRESSION: Large biopsy-proven carcinoma within the upper inner left breast, measuring at least 10 cm mammographically. Mild overlying skin thickening compatible with dermal invasion.  3 x 10 mm cluster of indeterminate left breast calcifications 4 cm anterior and lateral to the biopsy-proven neoplasm.  This may represent DCIS and consider tissue sampling.  Indeterminate 7 mm level I left axillary lymph node - metastatic disease not excluded.  No mammographic evidence of right breast malignancy.  BI-RADS CATEGORY 6:  Known biopsy-proven malignancy - appropriate action should be taken.  RECOMMENDATION: Treatment plan.  Consider biopsies of the indeterminate left axillary lymph node and left breast calcifications as clinically indicated.  Consider bilateral breast MRI as clinically indicated.  I have discussed the findings and recommendations with the patient. Results were also provided in writing at the conclusion of the visit.   Original Report Authenticated By: Harmon Pier, M.D.   Dg Fluoro Guide Cv Line-no Report  05/22/2013   CLINICAL DATA: surgery   FLOURO GUIDE CV LINE  Fluoroscopy was utilized by the requesting physician.  No radiographic  interpretation.    [place] Lt Breast Bx W Loc Dev 1st Lesion  Img Bx Spec US Guide  05/22/2013   **ADDENDUM** CREATED: 05/22/2013 11:12:58  The patient was called by myself, Dr. Micheline Maze today.  The patient states she is doing well since her left axillary node biopsy without problems at the biopsy site.  The patient was given the results of her biopsy which was compatible with metastatic invasive mammary carcinoma.  This is concordant with imaging findings and patient's known history of recent positive biopsy of a large inner upper left breast mass demonstrating invasive mammary carcinoma. The patient had her breast MRI this morning and will receive Port-A- Cath placement by Dr. Dwain Sarna this afternoon.  Recommend follow- up pending MRI results and per treatment plan.  **END ADDENDUM** SIGNED BY: Melton Alar. Micheline Maze, M.D.  05/21/2013   *RADIOLOGY REPORT*  Clinical Data:  New diagnosis of invasive mammary carcinoma in the left upper inner quadrant.  Abnormal lymph node noted on ultrasound dated 05/08/2013.  ULTRASOUND GUIDED CORE BIOPSY OF THE LEFT AXILLA  The patient and I discussed the procedure of ultrasound-guided biopsy, including benefits and alternatives.  We discussed the high likelihood of a successful procedure. We discussed the risks of the procedure, including infection, bleeding, tissue injury, clip migration, and inadequate sampling.  Written informed consent was given. Appropriate time-out was performed.  Using sterile technique, 2% lidocaine, ultrasound guidance, and a 14 gauge automated biopsy device, biopsy was performed of the abnormal left axillary lymph node using a lateromedial approach. At the conclusion of the procedure, a dumbbell shaped tissue marker clip was deployed into the biopsy cavity. Post biopsy mammogram was not performed.  IMPRESSION: Ultrasound guided biopsy of an abnormal left axillary lymph node. No apparent complications.  Original Report Authenticated By: Cain Saupe, M.D.    ASSESSMENT:   1. Patient is a 42 year old female  with high  grade invasive breast carcinoma ER 14%, PR negative, HER-2/neu negative.  She was evaluated by Dr. Dwain Sarna.  She will undergo neoadjuvant chemotherapy with dose dense Adriamycin/Cytoxan x 4 cycles followed by weekly Taxol/Carbo x 12 weeks.  She has underwent port placement and chemotherapy class.    #2 patient had CT and PET scan performed. She has no evidence of metastatic disease distantly.  #3 she was begun on neoadjuvant chemotherapy consisting of Adriamycin Cytoxan given Q2 weekly beginning on 06/08/2013. She completed this regimen, and is now receiving Taxol/Carbo weekly. 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  She will start Gemzar/Carbo on 8/22  #4 anemia due to menorrhagia. She is now status post therapy heme given on 06/13/2013.  #5 pain  #6 insomnia   PLAN:  1. Patient is doing well. Labs are stable.  We will hold chemotherapy today.  She will not receive Taxol carbo due to progressive neuropathy.  I increased the dose of her Fentanyl patches and oxycodone.  She will return next week for cycle 1 Gemzar Carboplatin.    2.She will continue with potassium supplementation.  Her potassium has been stable.    3. She will continue on Lovenox therapy.  I discussed using her port with Dr. Dwain Sarna.  Since the ultrasound essentially shows resolution of the clot, and the port is functional, she will remain on lovenox therapy and we will continue to use the port through chemo.    4. She will return in 1 week for labs, appt, and chemotherapy.    All questions were answered. The patient knows to call the clinic with any problems, questions or concerns. We can certainly see the patient much sooner if necessary.  I spent 25 minutes counseling the patient face to face. The total time spent in the appointment was 30 minutes.  Cherie Ouch Lyn Hollingshead, NP Medical Oncology [facility] Phone: [phone] 07/27/2013, 11:43 AM

## 2013-07-27 NOTE — Telephone Encounter (Signed)
Per staff phone call and POF I have schedueld appts.  JMW  

## 2013-07-27 NOTE — Patient Instructions (Signed)
Gemcitabine injection  What is this medicine?  GEMCITABINE (jem SIT a been) is a chemotherapy drug. This medicine is used to treat many types of cancer like breast cancer, lung cancer, pancreatic cancer, and ovarian cancer.  This medicine may be used for other purposes; ask your health care provider or pharmacist if you have questions.  What should I tell my health care provider before I take this medicine?  They need to know if you have any of these conditions:  -blood disorders  -infection  -kidney disease  -liver disease  -recent or ongoing radiation therapy  -an unusual or allergic reaction to gemcitabine, other chemotherapy, other medicines, foods, dyes, or preservatives  -pregnant or trying to get pregnant  -breast-feeding  How should I use this medicine?  This drug is given as an infusion into a vein. It is administered in a hospital or clinic by a specially trained health care professional.  Talk to your pediatrician regarding the use of this medicine in children. Special care may be needed.  Overdosage: If you think you have taken too much of this medicine contact a poison control center or emergency room at once.  NOTE: This medicine is only for you. Do not share this medicine with others.  What if I miss a dose?  It is important not to miss your dose. Call your doctor or health care professional if you are unable to keep an appointment.  What may interact with this medicine?  -medicines to increase blood counts like filgrastim, pegfilgrastim, sargramostim  -some other chemotherapy drugs like cisplatin  -vaccines  Talk to your doctor or health care professional before taking any of these medicines:  -acetaminophen  -aspirin  -ibuprofen  -ketoprofen  -naproxen  This list may not describe all possible interactions. Give your health care provider a list of all the medicines, herbs, non-prescription drugs, or dietary supplements you use. Also tell them if you smoke, drink alcohol, or use illegal drugs. Some  items may interact with your medicine.  What should I watch for while using this medicine?  Visit your doctor for checks on your progress. This drug may make you feel generally unwell. This is not uncommon, as chemotherapy can affect healthy cells as well as cancer cells. Report any side effects. Continue your course of treatment even though you feel ill unless your doctor tells you to stop.  In some cases, you may be given additional medicines to help with side effects. Follow all directions for their use.  Call your doctor or health care professional for advice if you get a fever, chills or sore throat, or other symptoms of a cold or flu. Do not treat yourself. This drug decreases your body's ability to fight infections. Try to avoid being around people who are sick.  This medicine may increase your risk to bruise or bleed. Call your doctor or health care professional if you notice any unusual bleeding.  Be careful brushing and flossing your teeth or using a toothpick because you may get an infection or bleed more easily. If you have any dental work done, tell your dentist you are receiving this medicine.  Avoid taking products that contain aspirin, acetaminophen, ibuprofen, naproxen, or ketoprofen unless instructed by your doctor. These medicines may hide a fever.  Women should inform their doctor if they wish to become pregnant or think they might be pregnant. There is a potential for serious side effects to an unborn child. Talk to your health care professional or pharmacist for   reactions like skin rash, itching or hives, swelling of the face, lips, or tongue -low blood counts - this medicine may decrease the number of white blood cells, red blood cells and  platelets. You may be at increased risk for infections and bleeding. -signs of infection - fever or chills, cough, sore throat, pain or difficulty passing urine -signs of decreased platelets or bleeding - bruising, pinpoint red spots on the skin, black, tarry stools, blood in the urine -signs of decreased red blood cells - unusually weak or tired, fainting spells, lightheadedness -breathing problems -chest pain -mouth sores -nausea and vomiting -pain, swelling, redness at site where injected -pain, tingling, numbness in the hands or feet -stomach pain -swelling of ankles, feet, hands -unusual bleeding Side effects that usually do not require medical attention (report to your doctor or health care professional if they continue or are bothersome): -constipation -diarrhea -hair loss -loss of appetite -stomach upset This list may not describe all possible side effects. Call your doctor for medical advice about side effects. You may report side effects to FDA at 1-800-FDA-1088. Where should I keep my medicine? This drug is given in a hospital or clinic and will not be stored at home. NOTE: This sheet is a summary. It may not cover all possible information. If you have questions about this medicine, talk to your doctor, pharmacist, or health care provider.  2012, [employer]. (04/09/2008 6:45:54 PM)Carboplatin injection What is this medicine? CARBOPLATIN (KAR boe pla tin) is a chemotherapy drug. It targets fast dividing cells, like cancer cells, and causes these cells to die. This medicine is used to treat ovarian cancer and many other cancers. This medicine may be used for other purposes; ask your health care provider or pharmacist if you have questions. What should I tell my health care provider before I take this medicine? They need to know if you have any of these conditions: -blood disorders -hearing problems -kidney disease -recent or ongoing radiation therapy -an unusual  or allergic reaction to carboplatin, cisplatin, other chemotherapy, other medicines, foods, dyes, or preservatives -pregnant or trying to get pregnant -breast-feeding How should I use this medicine? This drug is usually given as an infusion into a vein. It is administered in a hospital or clinic by a specially trained health care professional. Talk to your pediatrician regarding the use of this medicine in children. Special care may be needed. Overdosage: If you think you have taken too much of this medicine contact a poison control center or emergency room at once. NOTE: This medicine is only for you. Do not share this medicine with others. What if I miss a dose? It is important not to miss a dose. Call your doctor or health care professional if you are unable to keep an appointment. What may interact with this medicine? -medicines for seizures -medicines to increase blood counts like filgrastim, pegfilgrastim, sargramostim -some antibiotics like amikacin, gentamicin, neomycin, streptomycin, tobramycin -vaccines Talk to your doctor or health care professional before taking any of these medicines: -acetaminophen -aspirin -ibuprofen -ketoprofen -naproxen This list may not describe all possible interactions. Give your health care provider a list of all the medicines, herbs, non-prescription drugs, or dietary supplements you use. Also tell them if you smoke, drink alcohol, or use illegal drugs. Some items may interact with your medicine. What should I watch for while using this medicine? Your condition will be monitored carefully while you are receiving this medicine. You will need important blood work done while you are taking  this medicine. This drug may make you feel generally unwell. This is not uncommon, as chemotherapy can affect healthy cells as well as cancer cells. Report any side effects. Continue your course of treatment even though you feel ill unless your doctor tells you to  stop. In some cases, you may be given additional medicines to help with side effects. Follow all directions for their use. Call your doctor or health care professional for advice if you get a fever, chills or sore throat, or other symptoms of a cold or flu. Do not treat yourself. This drug decreases your body's ability to fight infections. Try to avoid being around people who are sick. This medicine may increase your risk to bruise or bleed. Call your doctor or health care professional if you notice any unusual bleeding. Be careful brushing and flossing your teeth or using a toothpick because you may get an infection or bleed more easily. If you have any dental work done, tell your dentist you are receiving this medicine. Avoid taking products that contain aspirin, acetaminophen, ibuprofen, naproxen, or ketoprofen unless instructed by your doctor. These medicines may hide a fever. Do not become pregnant while taking this medicine. Women should inform their doctor if they wish to become pregnant or think they might be pregnant. There is a potential for serious side effects to an unborn child. Talk to your health care professional or pharmacist for more information. Do not breast-feed an infant while taking this medicine. What side effects may I notice from receiving this medicine? Side effects that you should report to your doctor or health care professional as soon as possible: -allergic reactions like skin rash, itching or hives, swelling of the face, lips, or tongue -signs of infection - fever or chills, cough, sore throat, pain or difficulty passing urine -signs of decreased platelets or bleeding - bruising, pinpoint red spots on the skin, black, tarry stools, nosebleeds -signs of decreased red blood cells - unusually weak or tired, fainting spells, lightheadedness -breathing problems -changes in hearing -changes in vision -chest pain -high blood pressure -low blood counts - This drug may  decrease the number of white blood cells, red blood cells and platelets. You may be at increased risk for infections and bleeding. -nausea and vomiting -pain, swelling, redness or irritation at the injection site -pain, tingling, numbness in the hands or feet -problems with balance, talking, walking -trouble passing urine or change in the amount of urine Side effects that usually do not require medical attention (report to your doctor or health care professional if they continue or are bothersome): -hair loss -loss of appetite -metallic taste in the mouth or changes in taste This list may not describe all possible side effects. Call your doctor for medical advice about side effects. You may report side effects to FDA at 1-800-FDA-1088. Where should I keep my medicine? This drug is given in a hospital or clinic and will not be stored at home. NOTE: This sheet is a summary. It may not cover all possible information. If you have questions about this medicine, talk to your doctor, pharmacist, or health care provider.  2012, [employer]. (03/05/2008 2:38:05 PM)

## 2013-08-03 ENCOUNTER — Ambulatory Visit (HOSPITAL_BASED_OUTPATIENT_CLINIC_OR_DEPARTMENT_OTHER): Payer: No Typology Code available for payment source

## 2013-08-03 ENCOUNTER — Encounter: Payer: Self-pay | Admitting: Adult Health

## 2013-08-03 ENCOUNTER — Ambulatory Visit (HOSPITAL_BASED_OUTPATIENT_CLINIC_OR_DEPARTMENT_OTHER): Payer: No Typology Code available for payment source | Admitting: Adult Health

## 2013-08-03 ENCOUNTER — Other Ambulatory Visit (HOSPITAL_BASED_OUTPATIENT_CLINIC_OR_DEPARTMENT_OTHER): Payer: No Typology Code available for payment source

## 2013-08-03 VITALS — BP 121/81 | HR 90 | Temp 98.3°F | Resp 18 | Ht 65.5 in | Wt 220.4 lb

## 2013-08-03 DIAGNOSIS — Z7901 Long term (current) use of anticoagulants: Secondary | ICD-10-CM

## 2013-08-03 DIAGNOSIS — C50219 Malignant neoplasm of upper-inner quadrant of unspecified female breast: Secondary | ICD-10-CM

## 2013-08-03 DIAGNOSIS — N920 Excessive and frequent menstruation with regular cycle: Secondary | ICD-10-CM

## 2013-08-03 DIAGNOSIS — C50212 Malignant neoplasm of upper-inner quadrant of left female breast: Secondary | ICD-10-CM

## 2013-08-03 DIAGNOSIS — C773 Secondary and unspecified malignant neoplasm of axilla and upper limb lymph nodes: Secondary | ICD-10-CM

## 2013-08-03 DIAGNOSIS — I82401 Acute embolism and thrombosis of unspecified deep veins of right lower extremity: Secondary | ICD-10-CM

## 2013-08-03 DIAGNOSIS — D500 Iron deficiency anemia secondary to blood loss (chronic): Secondary | ICD-10-CM

## 2013-08-03 DIAGNOSIS — Z5111 Encounter for antineoplastic chemotherapy: Secondary | ICD-10-CM

## 2013-08-03 DIAGNOSIS — G609 Hereditary and idiopathic neuropathy, unspecified: Secondary | ICD-10-CM

## 2013-08-03 DIAGNOSIS — C50919 Malignant neoplasm of unspecified site of unspecified female breast: Secondary | ICD-10-CM

## 2013-08-03 LAB — COMPREHENSIVE METABOLIC PANEL (CC13)
Albumin: 3 g/dL — ABNORMAL LOW (ref 3.5–5.0)
Alkaline Phosphatase: 96 U/L (ref 40–150)
BUN: 6.4 mg/dL — ABNORMAL LOW (ref 7.0–26.0)
Creatinine: 0.7 mg/dL (ref 0.6–1.1)
Glucose: 88 mg/dl (ref 70–140)
Total Bilirubin: 0.22 mg/dL (ref 0.20–1.20)

## 2013-08-03 LAB — CBC WITH DIFFERENTIAL/PLATELET
Basophils Absolute: 0.1 10*3/uL (ref 0.0–0.1)
EOS%: 0.6 % (ref 0.0–7.0)
HCT: 29.5 % — ABNORMAL LOW (ref 34.8–46.6)
HGB: 9.3 g/dL — ABNORMAL LOW (ref 11.6–15.9)
LYMPH%: 15.3 % (ref 14.0–49.7)
MCH: 25.5 pg (ref 25.1–34.0)
MCV: 81 fL (ref 79.5–101.0)
MONO%: 16.3 % — ABNORMAL HIGH (ref 0.0–14.0)
NEUT%: 66.6 % (ref 38.4–76.8)

## 2013-08-03 MED ORDER — HEPARIN SOD (PORK) LOCK FLUSH 100 UNIT/ML IV SOLN
500.0000 [IU] | Freq: Once | INTRAVENOUS | Status: AC | PRN
  Administered 2013-08-03: 500 [IU]
  Filled 2013-08-03: qty 5

## 2013-08-03 MED ORDER — SODIUM CHLORIDE 0.9 % IV SOLN
800.0000 mg/m2 | Freq: Once | INTRAVENOUS | Status: AC
  Administered 2013-08-03: 1710 mg via INTRAVENOUS
  Filled 2013-08-03: qty 44.97

## 2013-08-03 MED ORDER — SODIUM CHLORIDE 0.9 % IV SOLN
300.0000 mg | Freq: Once | INTRAVENOUS | Status: AC
  Administered 2013-08-03: 300 mg via INTRAVENOUS
  Filled 2013-08-03: qty 30

## 2013-08-03 MED ORDER — OXYCODONE HCL 5 MG PO TABS: 5.0000 mg | ORAL_TABLET | ORAL | Status: DC | PRN

## 2013-08-03 MED ORDER — DEXAMETHASONE SODIUM PHOSPHATE 10 MG/ML IJ SOLN
10.0000 mg | Freq: Once | INTRAMUSCULAR | Status: AC
  Administered 2013-08-03: 10 mg via INTRAVENOUS

## 2013-08-03 MED ORDER — SODIUM CHLORIDE 0.9 % IJ SOLN
10.0000 mL | INTRAMUSCULAR | Status: DC | PRN
  Administered 2013-08-03: 10 mL
  Filled 2013-08-03: qty 10

## 2013-08-03 MED ORDER — ONDANSETRON 8 MG/50ML IVPB (CHCC)
8.0000 mg | Freq: Once | INTRAVENOUS | Status: AC
  Administered 2013-08-03: 8 mg via INTRAVENOUS

## 2013-08-03 MED ORDER — SODIUM CHLORIDE 0.9 % IV SOLN
Freq: Once | INTRAVENOUS | Status: AC
  Administered 2013-08-03: 15:00:00 via INTRAVENOUS

## 2013-08-03 MED ORDER — ENOXAPARIN SODIUM 150 MG/ML ~~LOC~~ SOLN: 150.0000 mg | Freq: Every day | SUBCUTANEOUS | Status: DC

## 2013-08-03 NOTE — Patient Instructions (Addendum)
[facility] Discharge Instructions for Patients Receiving Chemotherapy  Today you received the following chemotherapy agents: Gemzar, Carboplatin  To help prevent nausea and vomiting after your treatment, we encourage you to take your nausea medication as directed by your medical team.   If you develop nausea and vomiting that is not controlled by your nausea medication, call the clinic.   BELOW ARE SYMPTOMS THAT SHOULD BE REPORTED IMMEDIATELY:  *FEVER GREATER THAN 100.5 F  *CHILLS WITH OR WITHOUT FEVER  NAUSEA AND VOMITING THAT IS NOT CONTROLLED WITH YOUR NAUSEA MEDICATION  *UNUSUAL SHORTNESS OF BREATH  *UNUSUAL BRUISING OR BLEEDING  TENDERNESS IN MOUTH AND THROAT WITH OR WITHOUT PRESENCE OF ULCERS  *URINARY PROBLEMS  *BOWEL PROBLEMS  UNUSUAL RASH Items with * indicate a potential emergency and should be followed up as soon as possible.  Feel free to call the clinic you have any questions or concerns. The clinic phone number is [phone].   Gemcitabine injection (Gemzar)  What is this medicine? GEMCITABINE (jem SIT a been) is a chemotherapy drug. This medicine is used to treat many types of cancer like breast cancer, lung cancer, pancreatic cancer, and ovarian cancer. This medicine may be used for other purposes; ask your health care provider or pharmacist if you have questions. What should I tell my health care provider before I take this medicine? They need to know if you have any of these conditions: -blood disorders -infection -kidney disease -liver disease -recent or ongoing radiation therapy -an unusual or allergic reaction to gemcitabine, other chemotherapy, other medicines, foods, dyes, or preservatives -pregnant or trying to get pregnant -breast-feeding How should I use this medicine? This drug is given as an infusion into a vein. It is administered in a hospital or clinic by a specially trained health care professional. Talk to your  pediatrician regarding the use of this medicine in children. Special care may be needed. Overdosage: If you think you have taken too much of this medicine contact a poison control center or emergency room at once. NOTE: This medicine is only for you. Do not share this medicine with others. What if I miss a dose? It is important not to miss your dose. Call your doctor or health care professional if you are unable to keep an appointment. What may interact with this medicine? -medicines to increase blood counts like filgrastim, pegfilgrastim, sargramostim -some other chemotherapy drugs like cisplatin -vaccines Talk to your doctor or health care professional before taking any of these medicines: -acetaminophen -aspirin -ibuprofen -ketoprofen -naproxen This list may not describe all possible interactions. Give your health care provider a list of all the medicines, herbs, non-prescription drugs, or dietary supplements you use. Also tell them if you smoke, drink alcohol, or use illegal drugs. Some items may interact with your medicine. What should I watch for while using this medicine? Visit your doctor for checks on your progress. This drug may make you feel generally unwell. This is not uncommon, as chemotherapy can affect healthy cells as well as cancer cells. Report any side effects. Continue your course of treatment even though you feel ill unless your doctor tells you to stop. In some cases, you may be given additional medicines to help with side effects. Follow all directions for their use. Call your doctor or health care professional for advice if you get a fever, chills or sore throat, or other symptoms of a cold or flu. Do not treat yourself. This drug decreases your body's ability to fight  infections. Try to avoid being around people who are sick. This medicine may increase your risk to bruise or bleed. Call your doctor or health care professional if you notice any unusual bleeding. Be  careful brushing and flossing your teeth or using a toothpick because you may get an infection or bleed more easily. If you have any dental work done, tell your dentist you are receiving this medicine. Avoid taking products that contain aspirin, acetaminophen, ibuprofen, naproxen, or ketoprofen unless instructed by your doctor. These medicines may hide a fever. Women should inform their doctor if they wish to become pregnant or think they might be pregnant. There is a potential for serious side effects to an unborn child. Talk to your health care professional or pharmacist for more information. Do not breast-feed an infant while taking this medicine. What side effects may I notice from receiving this medicine? Side effects that you should report to your doctor or health care professional as soon as possible: -allergic reactions like skin rash, itching or hives, swelling of the face, lips, or tongue -low blood counts - this medicine may decrease the number of white blood cells, red blood cells and platelets. You may be at increased risk for infections and bleeding. -signs of infection - fever or chills, cough, sore throat, pain or difficulty passing urine -signs of decreased platelets or bleeding - bruising, pinpoint red spots on the skin, black, tarry stools, blood in the urine -signs of decreased red blood cells - unusually weak or tired, fainting spells, lightheadedness -breathing problems -chest pain -mouth sores -nausea and vomiting -pain, swelling, redness at site where injected -pain, tingling, numbness in the hands or feet -stomach pain -swelling of ankles, feet, hands -unusual bleeding Side effects that usually do not require medical attention (report to your doctor or health care professional if they continue or are bothersome): -constipation -diarrhea -hair loss -loss of appetite -stomach upset This list may not describe all possible side effects. Call your doctor for medical  advice about side effects. You may report side effects to FDA at 1-800-FDA-1088. Where should I keep my medicine? This drug is given in a hospital or clinic and will not be stored at home. NOTE: This sheet is a summary. It may not cover all possible information. If you have questions about this medicine, talk to your doctor, pharmacist, or health care provider.  2012, [employer]. (04/09/2008 6:45:54 PM)

## 2013-08-03 NOTE — Progress Notes (Signed)
OFFICE PROGRESS NOTE  CC**  Erika Flake, MD [address] 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    PRIOR THERAPY: 1. 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 2 months ago this began getting larger rapidly. She was seen in an urgent care where this mass was thought to be an abscess. Apparently there was an attempt to excise or drain this that was not successful and she was referred to Dr. Dwain Sarna for evaluation. She underwent punch biopsy on 04/23/13 of her skin and the mass. She was then also sent for mm/us. The biopsy shows invasive high grade carcinoma that appears to be breast. It is ER 14%, PR negative, HER-2/neu negative with a Ki-67 that approaches 100%. She has also undergone mm with at least 10 cm upper inner left breast tumor with mild overyling skin thickening. There is also a 3x10 mm cluster of calcs near there also. There is indeterminate 7 mm level 1 left axillary node, she underwent a lymph node biopsy that was positive for disease. No mm evidence of right breast malignancy.  An MRI of the breasts was done on 05/22/13 and showed a 14 cm mass in the left breast protruding from the skin and also a 2.5 cm level II axillary node.   2.  Patient evaluated and began on neoadjuvant chemotherapy.  Adriamycin and Cytoxan began on 05/25/13.  Taxol carbo began on 8/8. 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  #3 staging PET/CT performed on 06/13/2013 revealed no evidence of distant disease.  #4 anemia of iron deficiency  CURRENT THERAPY: gemzar/Carbo cycle 1 day 1  INTERVAL HISTORY: Erika [name] 42 y.o. female returns for f/u today. She is doing well today.  She will start Gemzar Carbo today due to neuropathies.  Her neuropathies are slightly improved.  She denies fevers, chills,  nausea, vomiting, constipation, diarrhea.  She has not been taking her lovenox for the past four days and thought she was complete with the injections.  Otherwise, a 10 point ROS is negative.   Marland Kitchen MEDICAL HISTORY: Past Medical History  Diagnosis Date  . Breast cancer     Invasive High Grade Carcinoma  . Headache(784.0)     Hx: of Migraines  . Status post chemotherapy     Adriamycin and Cytoxan    ALLERGIES:  has No Known Allergies.  MEDICATIONS:  Current Outpatient Prescriptions  Medication Sig Dispense Refill  . ciprofloxacin (CIPRO) 500 MG tablet Take 1 tablet (500 mg total) by mouth 2 (two) times daily.  14 tablet  0  . dexamethasone (DECADRON) 4 MG tablet Take 8 mg by mouth See admin instructions. Take daily for 3 days starting day after chemo      . enoxaparin (LOVENOX) 150 MG/ML injection Inject 1 mL (150 mg total) into the skin daily.  30 Syringe  6  . fentaNYL (DURAGESIC - DOSED MCG/HR) 25 MCG/HR patch Place 2 patches (50 mcg total) onto the skin every 3 (three) days.  10 patch  0  . ibuprofen (ADVIL,MOTRIN) 200 MG tablet Take 600-800 mg by mouth every 4 (four) hours as needed for pain.       Marland Kitchen lidocaine-prilocaine (EMLA) cream Apply 1 application topically every 14 (fourteen) days. Every other Friday prior to chemo      . oxyCODONE (OXY  IR/ROXICODONE) 5 MG immediate release tablet Take 1 tablet (5 mg total) by mouth every 4 (four) hours as needed for pain.  60 tablet  0  . potassium chloride SA (K-DUR,KLOR-CON) 20 MEQ tablet Take 1 tablet (20 mEq total) by mouth 3 (three) times daily.  90 tablet  0  . zolpidem (AMBIEN) 5 MG tablet Take 1 tablet (5 mg total) by mouth at bedtime as needed for sleep.  30 tablet  0   No current facility-administered medications for this visit.    SURGICAL HISTORY:  Past Surgical History  Procedure Laterality Date  . Tubal ligation    . Portacath placement Right 05/22/2013    Procedure: INSERTION PORT-A-CATH;  Surgeon: Emelia Loron, MD;   Location: [facility] OR;  Service: General;  Laterality: Right;  . Left breast needle core biopsy Left 05/21/13  . Left breast needle core  biopsy Left 06/22/13    REVIEW OF SYSTEMS:  General: fatigue (+), night sweats (-), fever (-), pain (+) Lymph: palpable nodes (-) HEENT: vision changes (-), mucositis (-), gum bleeding (-), epistaxis (-) Cardiovascular: chest pain (-), palpitations (-) Pulmonary: shortness of breath (-), dyspnea on exertion (-), cough (-), hemoptysis (-) GI:  Early satiety (-), melena (-), dysphagia (-), nausea/vomiting (-), diarrhea (-) GU: dysuria (-), hematuria (-), incontinence (-) Musculoskeletal: joint swelling (-), joint pain (-), back pain (-) Neuro: weakness (-), numbness (+), headache (-), confusion (-) Skin: Rash (-), lesions (-), dryness (-) Psych: depression (-), suicidal/homicidal ideation (-), feeling of hopelessness (-)    PHYSICAL EXAMINATION: Blood pressure 121/81, pulse 90, temperature 98.3 F (36.8 C), temperature source Oral, resp. rate 18, height 5' 5.5" (1.664 m), weight 220 lb 7 oz (99.99 kg). Body mass index is 36.11 kg/(m^2). General: Patient is a well appearing female in no acute distress HEENT: PERRLA, sclerae anicteric no conjunctival pallor, MMM Neck: supple, no palpable adenopathy Lungs: clear to auscultation bilaterally, no wheezes, rhonchi, or rales Cardiovascular: regular rate rhythm, S1, S2, no murmurs, rubs or gallops Abdomen: Soft, non-tender, non-distended, normoactive bowel sounds, no HSM Extremities: warm and well perfused, no clubbing, cyanosis, or edema Skin: No rashes or lesions Neuro: Non-focal Breasts: left breast mass is much smaller, now hardly palpable, skin change from where mass ulcerated is evident with mild thickness, however lesion is much improved.  ECOG PERFORMANCE STATUS: 1 - Symptomatic but completely ambulatory  LABORATORY DATA: Lab Results  Component Value Date   WBC 5.0 08/03/2013   HGB 9.3* 08/03/2013   HCT  29.5* 08/03/2013   MCV 81.0 08/03/2013   PLT 329 08/03/2013      Chemistry      Component Value Date/Time   NA 138 08/03/2013 1255   NA 139 05/18/2013 1205   K 4.0 08/03/2013 1255   K 3.8 05/18/2013 1205   CL 102 06/01/2013 1500   CL 103 05/18/2013 1205   CO2 21* 08/03/2013 1255   CO2 28 05/18/2013 1205   BUN 6.4* 08/03/2013 1255   BUN 8 05/18/2013 1205   CREATININE 0.7 08/03/2013 1255   CREATININE 0.84 05/18/2013 1205      Component Value Date/Time   CALCIUM 9.4 08/03/2013 1255   CALCIUM 9.1 05/18/2013 1205   ALKPHOS 96 08/03/2013 1255   ALKPHOS 111 05/18/2013 1205   AST 29 08/03/2013 1255   AST 18 05/18/2013 1205   ALT 33 08/03/2013 1255   ALT 16 05/18/2013 1205   BILITOT 0.22 08/03/2013 1255   BILITOT 0.2* 05/18/2013 1205  RADIOGRAPHIC STUDIES:  Chest 2 View  05/18/2013   *RADIOLOGY REPORT*  Clinical Data: Breast carcinoma  CHEST - 2 VIEW  Comparison: None.  Findings: Probable left breast mass. Lungs clear.  Heart size and pulmonary vascularity normal.  No effusion.  Visualized bones unremarkable.  IMPRESSION: No acute disease   Original Report Authenticated By: D. Andria Rhein, MD   US Breast Left  05/08/2013   *RADIOLOGY REPORT*  Clinical Data:  42 year old female with large left breast mass - recent punch biopsy demonstrating high-grade carcinoma.  DIGITAL DIAGNOSTIC BILATERAL MAMMOGRAM WITH CAD AND LEFT BREAST ULTRASOUND:  Comparison:  None.  Findings:  ACR Breast Density Category 2: There is a scattered fibroglandular pattern.  A large irregular mass within the upper inner posterior left breast is identified - measuring at least 10 cm. Mild overlying skin thickening is identified compatible with dermal invasion. A 3 x 10 mm cluster of slightly heterogeneous left breast calcifications approximate 4 cm anterior and lateral to this mass is identified.  There is no evidence of suspicious mass, distortion or worrisome calcifications within the right breast. Mammographic images were processed with CAD.   On physical exam, a very large protuberant mass within the upper inner left breast is identified.  Ultrasound is performed, showing a large heterogeneous mass within the upper inner left breast at the 10-11 o'clock position, measuring at least 6.5 cm, but evaluation is limited by transducer skin area and protuberant portion of the mass. A 7 mm level I left axillary lymph node is identified with slightly echogenic rim - suspicious.  IMPRESSION: Large biopsy-proven carcinoma within the upper inner left breast, measuring at least 10 cm mammographically. Mild overlying skin thickening compatible with dermal invasion.  3 x 10 mm cluster of indeterminate left breast calcifications 4 cm anterior and lateral to the biopsy-proven neoplasm.  This may represent DCIS and consider tissue sampling.  Indeterminate 7 mm level I left axillary lymph node - metastatic disease not excluded.  No mammographic evidence of right breast malignancy.  BI-RADS CATEGORY 6:  Known biopsy-proven malignancy - appropriate action should be taken.  RECOMMENDATION: Treatment plan.  Consider biopsies of the indeterminate left axillary lymph node and left breast calcifications as clinically indicated.  Consider bilateral breast MRI as clinically indicated.  I have discussed the findings and recommendations with the patient. Results were also provided in writing at the conclusion of the visit.   Original Report Authenticated By: Harmon Pier, M.D.   Mr Breast Bilateral W Wo Contrast  05/22/2013   *RADIOLOGY REPORT*  Clinical Data: large invasive carcinoma left upper inner quadrant with dermal involvement  BILATERAL BREAST MRI WITH AND WITHOUT CONTRAST  Technique: Multiplanar, multisequence MR images of both breasts were obtained prior to and following the intravenous administration of 20ml of Multihance.  Three dimensional images were evaluated at the independent DynaCad workstation.  Comparison:  05/08/13  Findings: There is moderate background  parenchymal enhancement. The right breast and axilla are negative.  On the left, there is a large irregular mass with irregular borders in the upper inner quadrant, demonstrating heterogenous internal enhancement.  The mass measures 14 (AP x 7.5 (lateral) x 8 (craniocaudal)cm.  It causes protrusion of the skin of the breast in the 10 - 11 o'clock position, and it is associated with edematous change in the skin of the left breast, primarily in the upper inner quadrant but also in the periareolar in more infra- areolar left breast.  The mass extends through pectoralis major musculature posteriorly,  with enhancing mass extending all the way to the posterior edge of the muscle.  In the 2 o'clock position posteriorly there is a 9mm oval enhancing mass with a mixture of plateau and washout kinetics; it is an indeterminate finding but shows the suggestion of a fatty hilum, and it may be an intramammary lymph node.  In the left axilla, there is an enlarged level II lymph node demonstrating hypervascularity and cortical thickening.  It measures 2.5cm in greatest dimension, and the appearance is concerning for metastasis.  There are no other abnormalities on the left.  IMPRESSION: Large left breast malignancy associated with involvement of the dermis and the chest wall, as well as with probable axillary lymph node metastasis.  There is an indeterminate 9mm mass in the 2 o'clock position, that may be an intramammary lymph node.  BI-RADS CATEGORY 6:  Known biopsy-proven malignancy - appropriate action should be taken.  THREE-DIMENSIONAL MR IMAGE RENDERING ON INDEPENDENT WORKSTATION:  Three-dimensional MR images were rendered by post-processing of the original MR data on an independent workstation.  The three- dimensional MR images were interpreted, and findings were reported in the accompanying complete MRI report for this study.   Original Report Authenticated By: Esperanza Heir, M.D.   Dg Chest Port 1 View  05/22/2013    *RADIOLOGY REPORT*  Clinical Data: Port-A-Cath placement.  PORTABLE CHEST - 1 VIEW  Comparison: 05/18/2013  Findings: Right-sided power injectable Port-A-Cath noted with distal tip projecting over the SVC.  Low lung volumes are present, causing crowding of the pulmonary vasculature.  No pneumothorax noted.  Bilateral midlung subsegmental atelectasis noted.  Borderline cardiomegaly.  IMPRESSION:  1.  Power injectable catheter tip:  SVC.  No pneumothorax.   Original Report Authenticated By: Gaylyn Rong, M.D.   Mm Digital Diagnostic Bilat  05/08/2013   *RADIOLOGY REPORT*  Clinical Data:  42 year old female with large left breast mass - recent punch biopsy demonstrating high-grade carcinoma.  DIGITAL DIAGNOSTIC BILATERAL MAMMOGRAM WITH CAD AND LEFT BREAST ULTRASOUND:  Comparison:  None.  Findings:  ACR Breast Density Category 2: There is a scattered fibroglandular pattern.  A large irregular mass within the upper inner posterior left breast is identified - measuring at least 10 cm. Mild overlying skin thickening is identified compatible with dermal invasion. A 3 x 10 mm cluster of slightly heterogeneous left breast calcifications approximate 4 cm anterior and lateral to this mass is identified.  There is no evidence of suspicious mass, distortion or worrisome calcifications within the right breast. Mammographic images were processed with CAD.  On physical exam, a very large protuberant mass within the upper inner left breast is identified.  Ultrasound is performed, showing a large heterogeneous mass within the upper inner left breast at the 10-11 o'clock position, measuring at least 6.5 cm, but evaluation is limited by transducer skin area and protuberant portion of the mass. A 7 mm level I left axillary lymph node is identified with slightly echogenic rim - suspicious.  IMPRESSION: Large biopsy-proven carcinoma within the upper inner left breast, measuring at least 10 cm mammographically. Mild overlying skin  thickening compatible with dermal invasion.  3 x 10 mm cluster of indeterminate left breast calcifications 4 cm anterior and lateral to the biopsy-proven neoplasm.  This may represent DCIS and consider tissue sampling.  Indeterminate 7 mm level I left axillary lymph node - metastatic disease not excluded.  No mammographic evidence of right breast malignancy.  BI-RADS CATEGORY 6:  Known biopsy-proven malignancy - appropriate action should be taken.  RECOMMENDATION: Treatment plan.  Consider biopsies of the indeterminate left axillary lymph node and left breast calcifications as clinically indicated.  Consider bilateral breast MRI as clinically indicated.  I have discussed the findings and recommendations with the patient. Results were also provided in writing at the conclusion of the visit.   Original Report Authenticated By: Harmon Pier, M.D.   Dg Fluoro Guide Cv Line-no Report  05/22/2013   CLINICAL DATA: surgery   FLOURO GUIDE CV LINE  Fluoroscopy was utilized by the requesting physician.  No radiographic  interpretation.    [place] Lt Breast Bx W Loc Dev 1st Lesion Img Bx Spec US Guide  05/22/2013   **ADDENDUM** CREATED: 05/22/2013 11:12:58  The patient was called by myself, Dr. Micheline Maze today.  The patient states she is doing well since her left axillary node biopsy without problems at the biopsy site.  The patient was given the results of her biopsy which was compatible with metastatic invasive mammary carcinoma.  This is concordant with imaging findings and patient's known history of recent positive biopsy of a large inner upper left breast mass demonstrating invasive mammary carcinoma. The patient had her breast MRI this morning and will receive Port-A- Cath placement by Dr. Dwain Sarna this afternoon.  Recommend follow- up pending MRI results and per treatment plan.  **END ADDENDUM** SIGNED BY: Melton Alar. Micheline Maze, M.D.  05/21/2013   *RADIOLOGY REPORT*  Clinical Data:  New diagnosis of invasive mammary carcinoma in  the left upper inner quadrant.  Abnormal lymph node noted on ultrasound dated 05/08/2013.  ULTRASOUND GUIDED CORE BIOPSY OF THE LEFT AXILLA  The patient and I discussed the procedure of ultrasound-guided biopsy, including benefits and alternatives.  We discussed the high likelihood of a successful procedure. We discussed the risks of the procedure, including infection, bleeding, tissue injury, clip migration, and inadequate sampling.  Written informed consent was given. Appropriate time-out was performed.  Using sterile technique, 2% lidocaine, ultrasound guidance, and a 14 gauge automated biopsy device, biopsy was performed of the abnormal left axillary lymph node using a lateromedial approach. At the conclusion of the procedure, a dumbbell shaped tissue marker clip was deployed into the biopsy cavity. Post biopsy mammogram was not performed.  IMPRESSION: Ultrasound guided biopsy of an abnormal left axillary lymph node. No apparent complications.  Original Report Authenticated By: Cain Saupe, M.D.    ASSESSMENT:   1. Patient is a 42 year old female with high grade invasive breast carcinoma ER 14%, PR negative, HER-2/neu negative.  She was evaluated by Dr. Dwain Sarna.  She will undergo neoadjuvant chemotherapy with dose dense Adriamycin/Cytoxan x 4 cycles followed by weekly Taxol/Carbo x 12 weeks.  She has underwent port placement and chemotherapy class.    #2 patient had CT and PET scan performed. She has no evidence of metastatic disease distantly.  #3 she was begun on neoadjuvant chemotherapy consisting of Adriamycin Cytoxan given Q2 weekly beginning on 06/08/2013. She completed this regimen, and is now receiving Taxol/Carbo weekly. 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  She started Gemzar/Carbo on 8/22  #4 anemia due to menorrhagia. She is now status post therapy heme given on 06/13/2013.  #5 pain  #6 insomnia   PLAN:  1. Patient is doing  well. Labs are stable.  She will proceed with Gemzar/Carbo today.  I discussed with her the differences in this regimen versus the weekly taxol carbo.  She is aware that she will receive neulasta support with this  regimen and will start taking Claritin tomorrow.      2.She will continue with potassium supplementation.  Her potassium has been stable.    3. She will continue on Lovenox therapy.  I re-educated her on the Lovenox and sent another order into the pharmacy.  She knows that she needs to be on this indefinitely.   4. She will return in 1 week for labs, appt, and evaluation for chemotoxicities.    All questions were answered. The patient knows to call the clinic with any problems, questions or concerns. We can certainly see the patient much sooner if necessary.  I spent 25 minutes counseling the patient face to face. The total time spent in the appointment was 30 minutes.  Cherie Ouch Lyn Hollingshead, NP Medical Oncology [facility] Phone: [phone] 08/04/2013, 10:11 PM

## 2013-08-03 NOTE — Patient Instructions (Addendum)
Doing well.  Proceed with Gemzar/Carbo today.  Please call us if you have any questions or concerns.

## 2013-08-04 ENCOUNTER — Ambulatory Visit (HOSPITAL_BASED_OUTPATIENT_CLINIC_OR_DEPARTMENT_OTHER): Payer: No Typology Code available for payment source

## 2013-08-04 VITALS — BP 114/69 | HR 66 | Temp 97.7°F

## 2013-08-04 DIAGNOSIS — C773 Secondary and unspecified malignant neoplasm of axilla and upper limb lymph nodes: Secondary | ICD-10-CM

## 2013-08-04 DIAGNOSIS — C50212 Malignant neoplasm of upper-inner quadrant of left female breast: Secondary | ICD-10-CM

## 2013-08-04 DIAGNOSIS — Z5189 Encounter for other specified aftercare: Secondary | ICD-10-CM

## 2013-08-04 DIAGNOSIS — C50219 Malignant neoplasm of upper-inner quadrant of unspecified female breast: Secondary | ICD-10-CM

## 2013-08-04 MED ORDER — PEGFILGRASTIM INJECTION 6 MG/0.6ML
6.0000 mg | Freq: Once | SUBCUTANEOUS | Status: AC
  Administered 2013-08-04: 6 mg via SUBCUTANEOUS

## 2013-08-10 ENCOUNTER — Ambulatory Visit: Payer: No Typology Code available for payment source

## 2013-08-10 ENCOUNTER — Encounter: Payer: Self-pay | Admitting: Adult Health

## 2013-08-10 ENCOUNTER — Ambulatory Visit (HOSPITAL_BASED_OUTPATIENT_CLINIC_OR_DEPARTMENT_OTHER): Payer: No Typology Code available for payment source | Admitting: Adult Health

## 2013-08-10 ENCOUNTER — Other Ambulatory Visit (HOSPITAL_BASED_OUTPATIENT_CLINIC_OR_DEPARTMENT_OTHER): Payer: No Typology Code available for payment source | Admitting: Lab

## 2013-08-10 VITALS — BP 119/77 | HR 102 | Temp 98.3°F | Resp 20 | Ht 65.5 in | Wt 226.2 lb

## 2013-08-10 DIAGNOSIS — C50919 Malignant neoplasm of unspecified site of unspecified female breast: Secondary | ICD-10-CM

## 2013-08-10 DIAGNOSIS — C50219 Malignant neoplasm of upper-inner quadrant of unspecified female breast: Secondary | ICD-10-CM

## 2013-08-10 DIAGNOSIS — Z5111 Encounter for antineoplastic chemotherapy: Secondary | ICD-10-CM

## 2013-08-10 DIAGNOSIS — D649 Anemia, unspecified: Secondary | ICD-10-CM

## 2013-08-10 DIAGNOSIS — C773 Secondary and unspecified malignant neoplasm of axilla and upper limb lymph nodes: Secondary | ICD-10-CM

## 2013-08-10 DIAGNOSIS — C50212 Malignant neoplasm of upper-inner quadrant of left female breast: Secondary | ICD-10-CM

## 2013-08-10 DIAGNOSIS — M899 Disorder of bone, unspecified: Secondary | ICD-10-CM

## 2013-08-10 LAB — CBC WITH DIFFERENTIAL/PLATELET
BASO%: 0.3 % (ref 0.0–2.0)
Eosinophils Absolute: 0 10*3/uL (ref 0.0–0.5)
MCV: 81.3 fL (ref 79.5–101.0)
MONO%: 1.1 % (ref 0.0–14.0)
NEUT#: 39.1 10*3/uL — ABNORMAL HIGH (ref 1.5–6.5)
RBC: 3.46 10*6/uL — ABNORMAL LOW (ref 3.70–5.45)
RDW: 33 % — ABNORMAL HIGH (ref 11.2–14.5)
WBC: 41.7 10*3/uL — ABNORMAL HIGH (ref 3.9–10.3)

## 2013-08-10 LAB — COMPREHENSIVE METABOLIC PANEL (CC13)
ALT: 76 U/L — ABNORMAL HIGH (ref 0–55)
AST: 24 U/L (ref 5–34)
Albumin: 3 g/dL — ABNORMAL LOW (ref 3.5–5.0)
Alkaline Phosphatase: 170 U/L — ABNORMAL HIGH (ref 40–150)
Glucose: 91 mg/dl (ref 70–140)
Potassium: 3.5 mEq/L (ref 3.5–5.1)
Sodium: 141 mEq/L (ref 136–145)
Total Protein: 6.7 g/dL (ref 6.4–8.3)

## 2013-08-10 MED ORDER — FENTANYL 50 MCG/HR TD PT72: 1.0000 | MEDICATED_PATCH | TRANSDERMAL | Status: DC

## 2013-08-10 MED ORDER — GOSERELIN ACETATE 3.6 MG ~~LOC~~ IMPL
3.6000 mg | DRUG_IMPLANT | SUBCUTANEOUS | Status: DC
  Administered 2013-08-10: 3.6 mg via SUBCUTANEOUS
  Filled 2013-08-10: qty 3.6

## 2013-08-10 MED ORDER — FENTANYL 25 MCG/HR TD PT72: 2.0000 | MEDICATED_PATCH | TRANSDERMAL | Status: DC

## 2013-08-10 NOTE — Progress Notes (Signed)
OFFICE PROGRESS NOTE  CC**  Erika Flake, MD [address] 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    PRIOR THERAPY: 1. 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 2 months ago this began getting larger rapidly. She was seen in an urgent care where this mass was thought to be an abscess. Apparently there was an attempt to excise or drain this that was not successful and she was referred to Dr. Dwain Sarna for evaluation. She underwent punch biopsy on 04/23/13 of her skin and the mass. She was then also sent for mm/us. The biopsy shows invasive high grade carcinoma that appears to be breast. It is ER 14%, PR negative, HER-2/neu negative with a Ki-67 that approaches 100%. She has also undergone mm with at least 10 cm upper inner left breast tumor with mild overyling skin thickening. There is also a 3x10 mm cluster of calcs near there also. There is indeterminate 7 mm level 1 left axillary node, she underwent a lymph node biopsy that was positive for disease. No mm evidence of right breast malignancy.  An MRI of the breasts was done on 05/22/13 and showed a 14 cm mass in the left breast protruding from the skin and also a 2.5 cm level II axillary node.   2.  Patient evaluated and began on neoadjuvant chemotherapy.  Adriamycin and Cytoxan began on 05/25/13.  Taxol carbo began on 8/8. 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  #3 staging PET/CT performed on 06/13/2013 revealed no evidence of distant disease.  #4 anemia of iron deficiency  CURRENT THERAPY: gemzar/Carbo cycle 1 day 8  INTERVAL HISTORY: Erika [name] 42 y.o. female returns for f/u today. She is doing well today.  She is not feeling well today.  She is having bone pain and skin pain, but otherwise denies fevers, chills, nausea, vomiting,  constipation, diarrhea, numbness.  She has burning in her eyes, however, otherwise a 10 point ROS is neg.  Marland Kitchen MEDICAL HISTORY: Past Medical History  Diagnosis Date  . Breast cancer     Invasive High Grade Carcinoma  . Headache(784.0)     Hx: of Migraines  . Status post chemotherapy     Adriamycin and Cytoxan    ALLERGIES:  has No Known Allergies.  MEDICATIONS:  Current Outpatient Prescriptions  Medication Sig Dispense Refill  . ciprofloxacin (CIPRO) 500 MG tablet Take 1 tablet (500 mg total) by mouth 2 (two) times daily.  14 tablet  0  . dexamethasone (DECADRON) 4 MG tablet Take 8 mg by mouth See admin instructions. Take daily for 3 days starting day after chemo      . enoxaparin (LOVENOX) 150 MG/ML injection Inject 1 mL (150 mg total) into the skin daily.  30 Syringe  6  . fentaNYL (DURAGESIC - DOSED MCG/HR) 25 MCG/HR patch Place 2 patches (50 mcg total) onto the skin every 3 (three) days.  10 patch  0  . ibuprofen (ADVIL,MOTRIN) 200 MG tablet Take 600-800 mg by mouth every 4 (four) hours as needed for pain.       Marland Kitchen lidocaine-prilocaine (EMLA) cream Apply 1 application topically every 14 (fourteen) days. Every other Friday prior to chemo      . oxyCODONE (OXY IR/ROXICODONE) 5 MG immediate release tablet Take 1 tablet (5 mg total) by mouth  every 4 (four) hours as needed for pain.  60 tablet  0  . potassium chloride SA (K-DUR,KLOR-CON) 20 MEQ tablet Take 1 tablet (20 mEq total) by mouth 3 (three) times daily.  90 tablet  0  . zolpidem (AMBIEN) 5 MG tablet Take 1 tablet (5 mg total) by mouth at bedtime as needed for sleep.  30 tablet  0   No current facility-administered medications for this visit.    SURGICAL HISTORY:  Past Surgical History  Procedure Laterality Date  . Tubal ligation    . Portacath placement Right 05/22/2013    Procedure: INSERTION PORT-A-CATH;  Surgeon: Emelia Loron, MD;  Location: [facility] OR;  Service: General;  Laterality: Right;  . Left breast needle core  biopsy Left 05/21/13  . Left breast needle core  biopsy Left 06/22/13    REVIEW OF SYSTEMS:  General: fatigue (+), night sweats (-), fever (-), pain (+) Lymph: palpable nodes (-) HEENT: vision changes (-), mucositis (-), gum bleeding (-), epistaxis (-) Cardiovascular: chest pain (-), palpitations (-) Pulmonary: shortness of breath (-), dyspnea on exertion (-), cough (-), hemoptysis (-) GI:  Early satiety (-), melena (-), dysphagia (-), nausea/vomiting (-), diarrhea (-) GU: dysuria (-), hematuria (-), incontinence (-) Musculoskeletal: joint swelling (-), joint pain (-), back pain (-) Neuro: weakness (-), numbness (+), headache (-), confusion (-) Skin: Rash (-), lesions (-), dryness (-) Psych: depression (-), suicidal/homicidal ideation (-), feeling of hopelessness (-)    PHYSICAL EXAMINATION: Blood pressure 119/77, pulse 102, temperature 98.3 F (36.8 C), temperature source Oral, resp. rate 20, height 5' 5.5" (1.664 m), weight 226 lb 3.2 oz (102.604 kg). Body mass index is 37.06 kg/(m^2). General: Patient is a well appearing female in no acute distress HEENT: PERRLA, sclerae anicteric no conjunctival pallor, MMM Neck: supple, no palpable adenopathy Lungs: clear to auscultation bilaterally, no wheezes, rhonchi, or rales Cardiovascular: regular rate rhythm, S1, S2, no murmurs, rubs or gallops Abdomen: Soft, non-tender, non-distended, normoactive bowel sounds, no HSM Extremities: warm and well perfused, no clubbing, cyanosis, or edema Skin: No rashes or lesions Neuro: Non-focal Breasts: left breast mass is much smaller, now hardly palpable, skin change from where mass ulcerated is evident with mild thickness, however lesion is much improved.  ECOG PERFORMANCE STATUS: 1 - Symptomatic but completely ambulatory  LABORATORY DATA: Lab Results  Component Value Date   WBC 41.7* 08/10/2013   HGB 9.1* 08/10/2013   HCT 28.1* 08/10/2013   MCV 81.3 08/10/2013   PLT 236 08/10/2013      Chemistry       Component Value Date/Time   NA 138 08/03/2013 1255   NA 139 05/18/2013 1205   K 4.0 08/03/2013 1255   K 3.8 05/18/2013 1205   CL 102 06/01/2013 1500   CL 103 05/18/2013 1205   CO2 21* 08/03/2013 1255   CO2 28 05/18/2013 1205   BUN 6.4* 08/03/2013 1255   BUN 8 05/18/2013 1205   CREATININE 0.7 08/03/2013 1255   CREATININE 0.84 05/18/2013 1205      Component Value Date/Time   CALCIUM 9.4 08/03/2013 1255   CALCIUM 9.1 05/18/2013 1205   ALKPHOS 96 08/03/2013 1255   ALKPHOS 111 05/18/2013 1205   AST 29 08/03/2013 1255   AST 18 05/18/2013 1205   ALT 33 08/03/2013 1255   ALT 16 05/18/2013 1205   BILITOT 0.22 08/03/2013 1255   BILITOT 0.2* 05/18/2013 1205       RADIOGRAPHIC STUDIES:  Chest 2 View  05/18/2013   *RADIOLOGY REPORT*  Clinical Data: Breast carcinoma  CHEST - 2 VIEW  Comparison: None.  Findings: Probable left breast mass. Lungs clear.  Heart size and pulmonary vascularity normal.  No effusion.  Visualized bones unremarkable.  IMPRESSION: No acute disease   Original Report Authenticated By: D. Andria Rhein, MD   US Breast Left  05/08/2013   *RADIOLOGY REPORT*  Clinical Data:  42 year old female with large left breast mass - recent punch biopsy demonstrating high-grade carcinoma.  DIGITAL DIAGNOSTIC BILATERAL MAMMOGRAM WITH CAD AND LEFT BREAST ULTRASOUND:  Comparison:  None.  Findings:  ACR Breast Density Category 2: There is a scattered fibroglandular pattern.  A large irregular mass within the upper inner posterior left breast is identified - measuring at least 10 cm. Mild overlying skin thickening is identified compatible with dermal invasion. A 3 x 10 mm cluster of slightly heterogeneous left breast calcifications approximate 4 cm anterior and lateral to this mass is identified.  There is no evidence of suspicious mass, distortion or worrisome calcifications within the right breast. Mammographic images were processed with CAD.  On physical exam, a very large protuberant mass within the upper inner left  breast is identified.  Ultrasound is performed, showing a large heterogeneous mass within the upper inner left breast at the 10-11 o'clock position, measuring at least 6.5 cm, but evaluation is limited by transducer skin area and protuberant portion of the mass. A 7 mm level I left axillary lymph node is identified with slightly echogenic rim - suspicious.  IMPRESSION: Large biopsy-proven carcinoma within the upper inner left breast, measuring at least 10 cm mammographically. Mild overlying skin thickening compatible with dermal invasion.  3 x 10 mm cluster of indeterminate left breast calcifications 4 cm anterior and lateral to the biopsy-proven neoplasm.  This may represent DCIS and consider tissue sampling.  Indeterminate 7 mm level I left axillary lymph node - metastatic disease not excluded.  No mammographic evidence of right breast malignancy.  BI-RADS CATEGORY 6:  Known biopsy-proven malignancy - appropriate action should be taken.  RECOMMENDATION: Treatment plan.  Consider biopsies of the indeterminate left axillary lymph node and left breast calcifications as clinically indicated.  Consider bilateral breast MRI as clinically indicated.  I have discussed the findings and recommendations with the patient. Results were also provided in writing at the conclusion of the visit.   Original Report Authenticated By: Harmon Pier, M.D.   Mr Breast Bilateral W Wo Contrast  05/22/2013   *RADIOLOGY REPORT*  Clinical Data: large invasive carcinoma left upper inner quadrant with dermal involvement  BILATERAL BREAST MRI WITH AND WITHOUT CONTRAST  Technique: Multiplanar, multisequence MR images of both breasts were obtained prior to and following the intravenous administration of 20ml of Multihance.  Three dimensional images were evaluated at the independent DynaCad workstation.  Comparison:  05/08/13  Findings: There is moderate background parenchymal enhancement. The right breast and axilla are negative.  On the left,  there is a large irregular mass with irregular borders in the upper inner quadrant, demonstrating heterogenous internal enhancement.  The mass measures 14 (AP x 7.5 (lateral) x 8 (craniocaudal)cm.  It causes protrusion of the skin of the breast in the 10 - 11 o'clock position, and it is associated with edematous change in the skin of the left breast, primarily in the upper inner quadrant but also in the periareolar in more infra- areolar left breast.  The mass extends through pectoralis major musculature posteriorly, with enhancing mass extending all the way to the posterior edge of the  muscle.  In the 2 o'clock position posteriorly there is a 9mm oval enhancing mass with a mixture of plateau and washout kinetics; it is an indeterminate finding but shows the suggestion of a fatty hilum, and it may be an intramammary lymph node.  In the left axilla, there is an enlarged level II lymph node demonstrating hypervascularity and cortical thickening.  It measures 2.5cm in greatest dimension, and the appearance is concerning for metastasis.  There are no other abnormalities on the left.  IMPRESSION: Large left breast malignancy associated with involvement of the dermis and the chest wall, as well as with probable axillary lymph node metastasis.  There is an indeterminate 9mm mass in the 2 o'clock position, that may be an intramammary lymph node.  BI-RADS CATEGORY 6:  Known biopsy-proven malignancy - appropriate action should be taken.  THREE-DIMENSIONAL MR IMAGE RENDERING ON INDEPENDENT WORKSTATION:  Three-dimensional MR images were rendered by post-processing of the original MR data on an independent workstation.  The three- dimensional MR images were interpreted, and findings were reported in the accompanying complete MRI report for this study.   Original Report Authenticated By: Esperanza Heir, M.D.   Dg Chest Port 1 View  05/22/2013   *RADIOLOGY REPORT*  Clinical Data: Port-A-Cath placement.  PORTABLE CHEST - 1 VIEW   Comparison: 05/18/2013  Findings: Right-sided power injectable Port-A-Cath noted with distal tip projecting over the SVC.  Low lung volumes are present, causing crowding of the pulmonary vasculature.  No pneumothorax noted.  Bilateral midlung subsegmental atelectasis noted.  Borderline cardiomegaly.  IMPRESSION:  1.  Power injectable catheter tip:  SVC.  No pneumothorax.   Original Report Authenticated By: Gaylyn Rong, M.D.   Mm Digital Diagnostic Bilat  05/08/2013   *RADIOLOGY REPORT*  Clinical Data:  42 year old female with large left breast mass - recent punch biopsy demonstrating high-grade carcinoma.  DIGITAL DIAGNOSTIC BILATERAL MAMMOGRAM WITH CAD AND LEFT BREAST ULTRASOUND:  Comparison:  None.  Findings:  ACR Breast Density Category 2: There is a scattered fibroglandular pattern.  A large irregular mass within the upper inner posterior left breast is identified - measuring at least 10 cm. Mild overlying skin thickening is identified compatible with dermal invasion. A 3 x 10 mm cluster of slightly heterogeneous left breast calcifications approximate 4 cm anterior and lateral to this mass is identified.  There is no evidence of suspicious mass, distortion or worrisome calcifications within the right breast. Mammographic images were processed with CAD.  On physical exam, a very large protuberant mass within the upper inner left breast is identified.  Ultrasound is performed, showing a large heterogeneous mass within the upper inner left breast at the 10-11 o'clock position, measuring at least 6.5 cm, but evaluation is limited by transducer skin area and protuberant portion of the mass. A 7 mm level I left axillary lymph node is identified with slightly echogenic rim - suspicious.  IMPRESSION: Large biopsy-proven carcinoma within the upper inner left breast, measuring at least 10 cm mammographically. Mild overlying skin thickening compatible with dermal invasion.  3 x 10 mm cluster of indeterminate left  breast calcifications 4 cm anterior and lateral to the biopsy-proven neoplasm.  This may represent DCIS and consider tissue sampling.  Indeterminate 7 mm level I left axillary lymph node - metastatic disease not excluded.  No mammographic evidence of right breast malignancy.  BI-RADS CATEGORY 6:  Known biopsy-proven malignancy - appropriate action should be taken.  RECOMMENDATION: Treatment plan.  Consider biopsies of the indeterminate left axillary lymph  node and left breast calcifications as clinically indicated.  Consider bilateral breast MRI as clinically indicated.  I have discussed the findings and recommendations with the patient. Results were also provided in writing at the conclusion of the visit.   Original Report Authenticated By: Harmon Pier, M.D.   Dg Fluoro Guide Cv Line-no Report  05/22/2013   CLINICAL DATA: surgery   FLOURO GUIDE CV LINE  Fluoroscopy was utilized by the requesting physician.  No radiographic  interpretation.    [place] Lt Breast Bx W Loc Dev 1st Lesion Img Bx Spec US Guide  05/22/2013   **ADDENDUM** CREATED: 05/22/2013 11:12:58  The patient was called by myself, Dr. Micheline Maze today.  The patient states she is doing well since her left axillary node biopsy without problems at the biopsy site.  The patient was given the results of her biopsy which was compatible with metastatic invasive mammary carcinoma.  This is concordant with imaging findings and patient's known history of recent positive biopsy of a large inner upper left breast mass demonstrating invasive mammary carcinoma. The patient had her breast MRI this morning and will receive Port-A- Cath placement by Dr. Dwain Sarna this afternoon.  Recommend follow- up pending MRI results and per treatment plan.  **END ADDENDUM** SIGNED BY: Melton Alar. Micheline Maze, M.D.  05/21/2013   *RADIOLOGY REPORT*  Clinical Data:  New diagnosis of invasive mammary carcinoma in the left upper inner quadrant.  Abnormal lymph node noted on ultrasound dated  05/08/2013.  ULTRASOUND GUIDED CORE BIOPSY OF THE LEFT AXILLA  The patient and I discussed the procedure of ultrasound-guided biopsy, including benefits and alternatives.  We discussed the high likelihood of a successful procedure. We discussed the risks of the procedure, including infection, bleeding, tissue injury, clip migration, and inadequate sampling.  Written informed consent was given. Appropriate time-out was performed.  Using sterile technique, 2% lidocaine, ultrasound guidance, and a 14 gauge automated biopsy device, biopsy was performed of the abnormal left axillary lymph node using a lateromedial approach. At the conclusion of the procedure, a dumbbell shaped tissue marker clip was deployed into the biopsy cavity. Post biopsy mammogram was not performed.  IMPRESSION: Ultrasound guided biopsy of an abnormal left axillary lymph node. No apparent complications.  Original Report Authenticated By: Cain Saupe, M.D.    ASSESSMENT:   1. Patient is a 42 year old female with high grade invasive breast carcinoma ER 14%, PR negative, HER-2/neu negative.  She was evaluated by Dr. Dwain Sarna.  She will undergo neoadjuvant chemotherapy with dose dense Adriamycin/Cytoxan x 4 cycles followed by weekly Taxol/Carbo x 12 weeks.  She has underwent port placement and chemotherapy class.    #2 patient had CT and PET scan performed. She has no evidence of metastatic disease distantly.  #3 she was begun on neoadjuvant chemotherapy consisting of Adriamycin Cytoxan given Q2 weekly beginning on 06/08/2013. She completed this regimen, and is now receiving Taxol/Carbo weekly. 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  She started Gemzar/Carbo on 8/22  #4 anemia due to menorrhagia. She is now status post therapy heme given on 06/13/2013.  #5 pain  #6 insomnia   PLAN:  1. Patient is doing moderately well.  Her WBC is 47K.  She is experiencing severe bone pain secondary  to Neulasta.  She will continue her fentanyl patches and percocet as needed for bone pain.    2.She will continue with potassium supplementation.  Her potassium has been stable.    3.  She will continue on Lovenox therapy.  I reinforced the necessity for her to remain on the Lovenox.  4. She will return in 1 week for labs, appt, and chemotherapy.      All questions were answered. The patient knows to call the clinic with any problems, questions or concerns. We can certainly see the patient much sooner if necessary.  I spent 25 minutes counseling the patient face to face. The total time spent in the appointment was 30 minutes.  Cherie Ouch Lyn Hollingshead, NP Medical Oncology [facility] Phone: [phone] 08/10/2013, 11:46 AM

## 2013-08-10 NOTE — Patient Instructions (Addendum)
Doing well.  Drink plenty of fluids.  Please call us if you have any questions or concerns.    We will see you back next week.

## 2013-08-15 ENCOUNTER — Encounter: Payer: Self-pay | Admitting: Oncology

## 2013-08-15 NOTE — Progress Notes (Signed)
Patient called and left a message to call back about the 400.00 grant. I called and explained all to her. We don't pay past due and must allow 3-4 weeks for payment to be processed and sent to creditors. I must have a copy of the bill also.

## 2013-08-17 ENCOUNTER — Telehealth: Payer: Self-pay | Admitting: Oncology

## 2013-08-17 ENCOUNTER — Ambulatory Visit (HOSPITAL_BASED_OUTPATIENT_CLINIC_OR_DEPARTMENT_OTHER): Payer: No Typology Code available for payment source

## 2013-08-17 ENCOUNTER — Other Ambulatory Visit (HOSPITAL_BASED_OUTPATIENT_CLINIC_OR_DEPARTMENT_OTHER): Payer: No Typology Code available for payment source

## 2013-08-17 ENCOUNTER — Ambulatory Visit (HOSPITAL_BASED_OUTPATIENT_CLINIC_OR_DEPARTMENT_OTHER): Payer: PRIVATE HEALTH INSURANCE | Admitting: Adult Health

## 2013-08-17 ENCOUNTER — Encounter: Payer: Self-pay | Admitting: Adult Health

## 2013-08-17 ENCOUNTER — Encounter: Payer: Self-pay | Admitting: Oncology

## 2013-08-17 VITALS — BP 122/82 | HR 104 | Temp 98.3°F | Resp 20 | Ht 65.5 in | Wt 228.3 lb

## 2013-08-17 DIAGNOSIS — D649 Anemia, unspecified: Secondary | ICD-10-CM

## 2013-08-17 DIAGNOSIS — Z171 Estrogen receptor negative status [ER-]: Secondary | ICD-10-CM

## 2013-08-17 DIAGNOSIS — R52 Pain, unspecified: Secondary | ICD-10-CM

## 2013-08-17 DIAGNOSIS — E876 Hypokalemia: Secondary | ICD-10-CM

## 2013-08-17 DIAGNOSIS — Z5111 Encounter for antineoplastic chemotherapy: Secondary | ICD-10-CM

## 2013-08-17 DIAGNOSIS — C773 Secondary and unspecified malignant neoplasm of axilla and upper limb lymph nodes: Secondary | ICD-10-CM

## 2013-08-17 DIAGNOSIS — C50219 Malignant neoplasm of upper-inner quadrant of unspecified female breast: Secondary | ICD-10-CM

## 2013-08-17 DIAGNOSIS — C50919 Malignant neoplasm of unspecified site of unspecified female breast: Secondary | ICD-10-CM

## 2013-08-17 DIAGNOSIS — G4700 Insomnia, unspecified: Secondary | ICD-10-CM

## 2013-08-17 DIAGNOSIS — C50212 Malignant neoplasm of upper-inner quadrant of left female breast: Secondary | ICD-10-CM

## 2013-08-17 LAB — CBC WITH DIFFERENTIAL/PLATELET
BASO%: 0.1 % (ref 0.0–2.0)
EOS%: 0.3 % (ref 0.0–7.0)
MCH: 27.6 pg (ref 25.1–34.0)
MCHC: 32.3 g/dL (ref 31.5–36.0)
MCV: 85.5 fL (ref 79.5–101.0)
NEUT#: 13.4 10*3/uL — ABNORMAL HIGH (ref 1.5–6.5)
RBC: 3.66 10*6/uL — ABNORMAL LOW (ref 3.70–5.45)
lymph#: 1.7 10*3/uL (ref 0.9–3.3)

## 2013-08-17 LAB — COMPREHENSIVE METABOLIC PANEL (CC13)
AST: 10 U/L (ref 5–34)
Albumin: 2.9 g/dL — ABNORMAL LOW (ref 3.5–5.0)
Alkaline Phosphatase: 105 U/L (ref 40–150)
Potassium: 3.7 mEq/L (ref 3.5–5.1)
Sodium: 140 mEq/L (ref 136–145)
Total Bilirubin: 0.21 mg/dL (ref 0.20–1.20)
Total Protein: 6.5 g/dL (ref 6.4–8.3)

## 2013-08-17 MED ORDER — DEXAMETHASONE SODIUM PHOSPHATE 10 MG/ML IJ SOLN
INTRAMUSCULAR | Status: AC
  Filled 2013-08-17: qty 1

## 2013-08-17 MED ORDER — SODIUM CHLORIDE 0.9 % IJ SOLN
10.0000 mL | INTRAMUSCULAR | Status: DC | PRN
  Administered 2013-08-17: 10 mL
  Filled 2013-08-17: qty 10

## 2013-08-17 MED ORDER — OXYCODONE HCL 5 MG PO TABS: 5.0000 mg | ORAL_TABLET | ORAL | Status: DC | PRN

## 2013-08-17 MED ORDER — LORAZEPAM 0.5 MG PO TABS: 0.5000 mg | ORAL_TABLET | Freq: Four times a day (QID) | ORAL | Status: DC | PRN

## 2013-08-17 MED ORDER — ONDANSETRON 8 MG/NS 50 ML IVPB
INTRAVENOUS | Status: AC
  Filled 2013-08-17: qty 8

## 2013-08-17 MED ORDER — ONDANSETRON 8 MG/50ML IVPB (CHCC)
8.0000 mg | Freq: Once | INTRAVENOUS | Status: AC
  Administered 2013-08-17: 8 mg via INTRAVENOUS

## 2013-08-17 MED ORDER — GEMCITABINE HCL CHEMO INJECTION 1 GM/26.3ML
800.0000 mg/m2 | Freq: Once | INTRAVENOUS | Status: AC
  Administered 2013-08-17: 1710 mg via INTRAVENOUS
  Filled 2013-08-17: qty 44.97

## 2013-08-17 MED ORDER — SODIUM CHLORIDE 0.9 % IV SOLN
300.0000 mg | Freq: Once | INTRAVENOUS | Status: AC
  Administered 2013-08-17: 300 mg via INTRAVENOUS
  Filled 2013-08-17: qty 30

## 2013-08-17 MED ORDER — SODIUM CHLORIDE 0.9 % IV SOLN
Freq: Once | INTRAVENOUS | Status: AC
  Administered 2013-08-17: 14:00:00 via INTRAVENOUS

## 2013-08-17 MED ORDER — HEPARIN SOD (PORK) LOCK FLUSH 100 UNIT/ML IV SOLN
500.0000 [IU] | Freq: Once | INTRAVENOUS | Status: AC | PRN
  Administered 2013-08-17: 500 [IU]
  Filled 2013-08-17: qty 5

## 2013-08-17 MED ORDER — POTASSIUM CHLORIDE CRYS ER 20 MEQ PO TBCR: 20.0000 meq | EXTENDED_RELEASE_TABLET | Freq: Three times a day (TID) | ORAL | Status: DC

## 2013-08-17 MED ORDER — DEXAMETHASONE SODIUM PHOSPHATE 10 MG/ML IJ SOLN
10.0000 mg | Freq: Once | INTRAMUSCULAR | Status: AC
  Administered 2013-08-17: 10 mg via INTRAVENOUS

## 2013-08-17 NOTE — Progress Notes (Signed)
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    PRIOR THERAPY: 1. 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 2 months ago this began getting larger rapidly. She was seen in an urgent care where this mass was thought to be an abscess. Apparently there was an attempt to excise or drain this that was not successful and she was referred to Dr. Dwain Sarna for evaluation. She underwent punch biopsy on 04/23/13 of her skin and the mass. She was then also sent for mm/us. The biopsy shows invasive high grade carcinoma that appears to be breast. It is ER 14%, PR negative, HER-2/neu negative with a Ki-67 that approaches 100%. She has also undergone mm with at least 10 cm upper inner left breast tumor with mild overyling skin thickening. There is also a 3x10 mm cluster of calcs near there also. There is indeterminate 7 mm level 1 left axillary node, she underwent a lymph node biopsy that was positive for disease. No mm evidence of right breast malignancy.  An MRI of the breasts was done on 05/22/13 and showed a 14 cm mass in the left breast protruding from the skin and also a 2.5 cm level II axillary node.   2.  Patient evaluated and began on neoadjuvant chemotherapy.  Adriamycin and Cytoxan began on 05/25/13.  Taxol carbo began on 8/8. 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  #3 staging PET/CT performed on 06/13/2013 revealed no evidence of distant disease.  #4 anemia of iron deficiency  CURRENT THERAPY: gemzar/Carbo cycle 2 day 1  INTERVAL HISTORY: Erika [name] 42 y.o. female returns for f/u today. She is doing well today.  She returns for cycle 2 of Gemzar Carbo.  Her pain is improved.  I received a letter from [employer] about her pain narcotic use  and I discussed this with her.  The letter states that she's been getting narcotics from more than 3 providers from April-June 2014. I shared this with her, and she states the only prescriber has been myself or Dr. Welton Flakes.  She states she would look into it.  Otherwise, she denies fevers, chills, nausea, vomiting, constipation, diarrhea, numbness or any further concerns.    MEDICAL HISTORY: Past Medical History  Diagnosis Date  . Breast cancer     Invasive High Grade Carcinoma  . Headache(784.0)     Hx: of Migraines  . Status post chemotherapy     Adriamycin and Cytoxan    ALLERGIES:  has No Known Allergies.  MEDICATIONS:  Current Outpatient Prescriptions  Medication Sig Dispense Refill  . ciprofloxacin (CIPRO) 500 MG tablet Take 1 tablet (500 mg total) by mouth 2 (two) times daily.  14 tablet  0  . dexamethasone (DECADRON) 4 MG tablet Take 8 mg by mouth See admin instructions. Take daily for 3 days starting day after chemo      . enoxaparin (LOVENOX) 150 MG/ML injection Inject 1 mL (150 mg total) into the skin daily.  30 Syringe  6  . fentaNYL (DURAGESIC - DOSED MCG/HR) 50 MCG/HR Place 1 patch (50 mcg total) onto the skin every 3 (three) days.  10 patch  0  . ibuprofen (ADVIL,MOTRIN) 200 MG tablet Take 600-800 mg by mouth every 4 (four) hours as needed  for pain.       Marland Kitchen lidocaine-prilocaine (EMLA) cream Apply 1 application topically every 14 (fourteen) days. Every other Friday prior to chemo      . LORazepam (ATIVAN) 0.5 MG tablet Take 1 tablet (0.5 mg total) by mouth every 6 (six) hours as needed for anxiety.  30 tablet  0  . oxyCODONE (OXY IR/ROXICODONE) 5 MG immediate release tablet Take 1 tablet (5 mg total) by mouth every 4 (four) hours as needed for pain.  60 tablet  0  . potassium chloride SA (K-DUR,KLOR-CON) 20 MEQ tablet Take 1 tablet (20 mEq total) by mouth 3 (three) times daily.  90 tablet  0  . zolpidem (AMBIEN) 5 MG tablet Take 1 tablet (5 mg total) by mouth at bedtime as  needed for sleep.  30 tablet  0   No current facility-administered medications for this visit.   Facility-Administered Medications Ordered in Other Visits  Medication Dose Route Frequency Provider Last Rate Last Dose  . 0.9 %  sodium chloride infusion   Intravenous Once Augustin Schooling, NP      . CARBOplatin (PARAPLATIN) 300 mg in sodium chloride 0.9 % 100 mL chemo infusion  300 mg Intravenous Once Augustin Schooling, NP      . dexamethasone (DECADRON) injection 10 mg  10 mg Intravenous Once Augustin Schooling, NP      . Gemcitabine HCl (GEMZAR) 1,710 mg in sodium chloride 0.9 % 100 mL chemo infusion  800 mg/m2 (Treatment Plan Actual) Intravenous Once Augustin Schooling, NP      . heparin lock flush 100 unit/mL  500 Units Intracatheter Once PRN Augustin Schooling, NP      . ondansetron (ZOFRAN) IVPB 8 mg  8 mg Intravenous Once Augustin Schooling, NP      . sodium chloride 0.9 % injection 10 mL  10 mL Intracatheter PRN Augustin Schooling, NP        SURGICAL HISTORY:  Past Surgical History  Procedure Laterality Date  . Tubal ligation    . Portacath placement Right 05/22/2013    Procedure: INSERTION PORT-A-CATH;  Surgeon: Emelia Loron, MD;  Location: [facility] OR;  Service: General;  Laterality: Right;  . Left breast needle core biopsy Left 05/21/13  . Left breast needle core  biopsy Left 06/22/13    REVIEW OF SYSTEMS:  General: fatigue (+), night sweats (-), fever (-), pain (+) Lymph: palpable nodes (-) HEENT: vision changes (-), mucositis (-), gum bleeding (-), epistaxis (-) Cardiovascular: chest pain (-), palpitations (-) Pulmonary: shortness of breath (-), dyspnea on exertion (-), cough (-), hemoptysis (-) GI:  Early satiety (-), melena (-), dysphagia (-), nausea/vomiting (-), diarrhea (-) GU: dysuria (-), hematuria (-), incontinence (-) Musculoskeletal: joint swelling (-), joint pain (-), back pain (-) Neuro: weakness (-), numbness (+), headache (-), confusion (-) Skin: Rash (-), lesions  (-), dryness (-) Psych: depression (-), suicidal/homicidal ideation (-), feeling of hopelessness (-)    PHYSICAL EXAMINATION: Blood pressure 122/82, pulse 104, temperature 98.3 F (36.8 C), temperature source Oral, resp. rate 20, height 5' 5.5" (1.664 m), weight 228 lb 4.8 oz (103.556 kg). Body mass index is 37.4 kg/(m^2). General: Patient is a well appearing female in no acute distress HEENT: PERRLA, sclerae anicteric no conjunctival pallor, MMM Neck: supple, no palpable adenopathy Lungs: clear to auscultation bilaterally, no wheezes, rhonchi, or rales Cardiovascular: regular rate rhythm, S1, S2, no murmurs, rubs or gallops Abdomen: Soft, non-tender, non-distended, normoactive bowel sounds, no HSM Extremities: warm and well perfused, no clubbing, cyanosis, or  edema Skin: No rashes or lesions Neuro: Non-focal Breasts: left breast mass is much smaller, now hardly palpable, skin change from where mass ulcerated is evident with mild thickness, however lesion is much improved.  ECOG PERFORMANCE STATUS: 1 - Symptomatic but completely ambulatory  LABORATORY DATA: Lab Results  Component Value Date   WBC 15.9* 08/17/2013   HGB 10.1* 08/17/2013   HCT 31.3* 08/17/2013   MCV 85.5 08/17/2013   PLT 246 08/17/2013      Chemistry      Component Value Date/Time   NA 141 08/10/2013 1132   NA 139 05/18/2013 1205   K 3.5 08/10/2013 1132   K 3.8 05/18/2013 1205   CL 102 06/01/2013 1500   CL 103 05/18/2013 1205   CO2 24 08/10/2013 1132   CO2 28 05/18/2013 1205   BUN 13.0 08/10/2013 1132   BUN 8 05/18/2013 1205   CREATININE 0.8 08/10/2013 1132   CREATININE 0.84 05/18/2013 1205      Component Value Date/Time   CALCIUM 9.3 08/10/2013 1132   CALCIUM 9.1 05/18/2013 1205   ALKPHOS 170* 08/10/2013 1132   ALKPHOS 111 05/18/2013 1205   AST 24 08/10/2013 1132   AST 18 05/18/2013 1205   ALT 76* 08/10/2013 1132   ALT 16 05/18/2013 1205   BILITOT 0.24 08/10/2013 1132   BILITOT 0.2* 05/18/2013 1205       RADIOGRAPHIC  STUDIES:  Chest 2 View  05/18/2013   *RADIOLOGY REPORT*  Clinical Data: Breast carcinoma  CHEST - 2 VIEW  Comparison: None.  Findings: Probable left breast mass. Lungs clear.  Heart size and pulmonary vascularity normal.  No effusion.  Visualized bones unremarkable.  IMPRESSION: No acute disease   Original Report Authenticated By: D. Andria Rhein, MD   US Breast Left  05/08/2013   *RADIOLOGY REPORT*  Clinical Data:  42 year old female with large left breast mass - recent punch biopsy demonstrating high-grade carcinoma.  DIGITAL DIAGNOSTIC BILATERAL MAMMOGRAM WITH CAD AND LEFT BREAST ULTRASOUND:  Comparison:  None.  Findings:  ACR Breast Density Category 2: There is a scattered fibroglandular pattern.  A large irregular mass within the upper inner posterior left breast is identified - measuring at least 10 cm. Mild overlying skin thickening is identified compatible with dermal invasion. A 3 x 10 mm cluster of slightly heterogeneous left breast calcifications approximate 4 cm anterior and lateral to this mass is identified.  There is no evidence of suspicious mass, distortion or worrisome calcifications within the right breast. Mammographic images were processed with CAD.  On physical exam, a very large protuberant mass within the upper inner left breast is identified.  Ultrasound is performed, showing a large heterogeneous mass within the upper inner left breast at the 10-11 o'clock position, measuring at least 6.5 cm, but evaluation is limited by transducer skin area and protuberant portion of the mass. A 7 mm level I left axillary lymph node is identified with slightly echogenic rim - suspicious.  IMPRESSION: Large biopsy-proven carcinoma within the upper inner left breast, measuring at least 10 cm mammographically. Mild overlying skin thickening compatible with dermal invasion.  3 x 10 mm cluster of indeterminate left breast calcifications 4 cm anterior and lateral to the biopsy-proven neoplasm.  This may  represent DCIS and consider tissue sampling.  Indeterminate 7 mm level I left axillary lymph node - metastatic disease not excluded.  No mammographic evidence of right breast malignancy.  BI-RADS CATEGORY 6:  Known biopsy-proven malignancy - appropriate action should be taken.  RECOMMENDATION: Treatment plan.  Consider biopsies of the indeterminate left axillary lymph node and left breast calcifications as clinically indicated.  Consider bilateral breast MRI as clinically indicated.  I have discussed the findings and recommendations with the patient. Results were also provided in writing at the conclusion of the visit.   Original Report Authenticated By: Harmon Pier, M.D.   Mr Breast Bilateral W Wo Contrast  05/22/2013   *RADIOLOGY REPORT*  Clinical Data: large invasive carcinoma left upper inner quadrant with dermal involvement  BILATERAL BREAST MRI WITH AND WITHOUT CONTRAST  Technique: Multiplanar, multisequence MR images of both breasts were obtained prior to and following the intravenous administration of 20ml of Multihance.  Three dimensional images were evaluated at the independent DynaCad workstation.  Comparison:  05/08/13  Findings: There is moderate background parenchymal enhancement. The right breast and axilla are negative.  On the left, there is a large irregular mass with irregular borders in the upper inner quadrant, demonstrating heterogenous internal enhancement.  The mass measures 14 (AP x 7.5 (lateral) x 8 (craniocaudal)cm.  It causes protrusion of the skin of the breast in the 10 - 11 o'clock position, and it is associated with edematous change in the skin of the left breast, primarily in the upper inner quadrant but also in the periareolar in more infra- areolar left breast.  The mass extends through pectoralis major musculature posteriorly, with enhancing mass extending all the way to the posterior edge of the muscle.  In the 2 o'clock position posteriorly there is a 9mm oval enhancing mass  with a mixture of plateau and washout kinetics; it is an indeterminate finding but shows the suggestion of a fatty hilum, and it may be an intramammary lymph node.  In the left axilla, there is an enlarged level II lymph node demonstrating hypervascularity and cortical thickening.  It measures 2.5cm in greatest dimension, and the appearance is concerning for metastasis.  There are no other abnormalities on the left.  IMPRESSION: Large left breast malignancy associated with involvement of the dermis and the chest wall, as well as with probable axillary lymph node metastasis.  There is an indeterminate 9mm mass in the 2 o'clock position, that may be an intramammary lymph node.  BI-RADS CATEGORY 6:  Known biopsy-proven malignancy - appropriate action should be taken.  THREE-DIMENSIONAL MR IMAGE RENDERING ON INDEPENDENT WORKSTATION:  Three-dimensional MR images were rendered by post-processing of the original MR data on an independent workstation.  The three- dimensional MR images were interpreted, and findings were reported in the accompanying complete MRI report for this study.   Original Report Authenticated By: Esperanza Heir, M.D.   Dg Chest Port 1 View  05/22/2013   *RADIOLOGY REPORT*  Clinical Data: Port-A-Cath placement.  PORTABLE CHEST - 1 VIEW  Comparison: 05/18/2013  Findings: Right-sided power injectable Port-A-Cath noted with distal tip projecting over the SVC.  Low lung volumes are present, causing crowding of the pulmonary vasculature.  No pneumothorax noted.  Bilateral midlung subsegmental atelectasis noted.  Borderline cardiomegaly.  IMPRESSION:  1.  Power injectable catheter tip:  SVC.  No pneumothorax.   Original Report Authenticated By: Gaylyn Rong, M.D.   Mm Digital Diagnostic Bilat  05/08/2013   *RADIOLOGY REPORT*  Clinical Data:  42 year old female with large left breast mass - recent punch biopsy demonstrating high-grade carcinoma.  DIGITAL DIAGNOSTIC BILATERAL MAMMOGRAM WITH CAD AND  LEFT BREAST ULTRASOUND:  Comparison:  None.  Findings:  ACR Breast Density Category 2: There is a scattered fibroglandular pattern.  A large irregular mass within the upper inner posterior left breast is identified - measuring at least 10 cm. Mild overlying skin thickening is identified compatible with dermal invasion. A 3 x 10 mm cluster of slightly heterogeneous left breast calcifications approximate 4 cm anterior and lateral to this mass is identified.  There is no evidence of suspicious mass, distortion or worrisome calcifications within the right breast. Mammographic images were processed with CAD.  On physical exam, a very large protuberant mass within the upper inner left breast is identified.  Ultrasound is performed, showing a large heterogeneous mass within the upper inner left breast at the 10-11 o'clock position, measuring at least 6.5 cm, but evaluation is limited by transducer skin area and protuberant portion of the mass. A 7 mm level I left axillary lymph node is identified with slightly echogenic rim - suspicious.  IMPRESSION: Large biopsy-proven carcinoma within the upper inner left breast, measuring at least 10 cm mammographically. Mild overlying skin thickening compatible with dermal invasion.  3 x 10 mm cluster of indeterminate left breast calcifications 4 cm anterior and lateral to the biopsy-proven neoplasm.  This may represent DCIS and consider tissue sampling.  Indeterminate 7 mm level I left axillary lymph node - metastatic disease not excluded.  No mammographic evidence of right breast malignancy.  BI-RADS CATEGORY 6:  Known biopsy-proven malignancy - appropriate action should be taken.  RECOMMENDATION: Treatment plan.  Consider biopsies of the indeterminate left axillary lymph node and left breast calcifications as clinically indicated.  Consider bilateral breast MRI as clinically indicated.  I have discussed the findings and recommendations with the patient. Results were also provided in  writing at the conclusion of the visit.   Original Report Authenticated By: Harmon Pier, M.D.   Dg Fluoro Guide Cv Line-no Report  05/22/2013   CLINICAL DATA: surgery   FLOURO GUIDE CV LINE  Fluoroscopy was utilized by the requesting physician.  No radiographic  interpretation.    [place] Lt Breast Bx W Loc Dev 1st Lesion Img Bx Spec US Guide  05/22/2013   **ADDENDUM** CREATED: 05/22/2013 11:12:58  The patient was called by myself, Dr. Micheline Maze today.  The patient states she is doing well since her left axillary node biopsy without problems at the biopsy site.  The patient was given the results of her biopsy which was compatible with metastatic invasive mammary carcinoma.  This is concordant with imaging findings and patient's known history of recent positive biopsy of a large inner upper left breast mass demonstrating invasive mammary carcinoma. The patient had her breast MRI this morning and will receive Port-A- Cath placement by Dr. Dwain Sarna this afternoon.  Recommend follow- up pending MRI results and per treatment plan.  **END ADDENDUM** SIGNED BY: Melton Alar. Micheline Maze, M.D.  05/21/2013   *RADIOLOGY REPORT*  Clinical Data:  New diagnosis of invasive mammary carcinoma in the left upper inner quadrant.  Abnormal lymph node noted on ultrasound dated 05/08/2013.  ULTRASOUND GUIDED CORE BIOPSY OF THE LEFT AXILLA  The patient and I discussed the procedure of ultrasound-guided biopsy, including benefits and alternatives.  We discussed the high likelihood of a successful procedure. We discussed the risks of the procedure, including infection, bleeding, tissue injury, clip migration, and inadequate sampling.  Written informed consent was given. Appropriate time-out was performed.  Using sterile technique, 2% lidocaine, ultrasound guidance, and a 14 gauge automated biopsy device, biopsy was performed of the abnormal left axillary lymph node using a lateromedial approach. At the conclusion of the procedure,  a dumbbell shaped  tissue marker clip was deployed into the biopsy cavity. Post biopsy mammogram was not performed.  IMPRESSION: Ultrasound guided biopsy of an abnormal left axillary lymph node. No apparent complications.  Original Report Authenticated By: Cain Saupe, M.D.    ASSESSMENT:   1. Patient is a 42 year old female with high grade invasive breast carcinoma ER 14%, PR negative, HER-2/neu negative.  She was evaluated by Dr. Dwain Sarna.  She will undergo neoadjuvant chemotherapy with dose dense Adriamycin/Cytoxan x 4 cycles followed by weekly Taxol/Carbo x 12 weeks.  She has underwent port placement and chemotherapy class.    #2 patient had CT and PET scan performed. She has no evidence of metastatic disease distantly.  #3 she was begun on neoadjuvant chemotherapy consisting of Adriamycin Cytoxan given Q2 weekly beginning on 06/08/2013. She completed this regimen, and is now receiving Taxol/Carbo weekly. 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  She started Gemzar/Carbo on 8/22  #4 anemia due to menorrhagia. She is now status post therapy heme given on 06/13/2013.  #5 pain  #6 insomnia   PLAN:  1. Patient is doing well.  She will proceed with chemotherapy today.    2.She will continue with potassium supplementation.  Her potassium has been stable.    3. She will continue on Lovenox therapy.  I reinforced the necessity for her to remain on the Lovenox.  4. In regards to the pain medications: she agreed to only receive pain medications from myself or Dr. Welton Flakes.  She also plans to look into the claim, stating it could be possible insurance fraud.    5. She will return tomorrow for Neulasta and in 1 week for labs and evaluation.    All questions were answered. The patient knows to call the clinic with any problems, questions or concerns. We can certainly see the patient much sooner if necessary.  I spent 25 minutes counseling the patient face to face. The  total time spent in the appointment was 30 minutes.  Cherie Ouch Lyn Hollingshead, NP Medical Oncology [facility] Phone: [phone] 08/17/2013, 1:43 PM

## 2013-08-17 NOTE — Patient Instructions (Signed)
Doing well.  Proceed with chemotherapy.  Please call us if you have any questions or concerns.    

## 2013-08-17 NOTE — Patient Instructions (Addendum)
[facility] Discharge Instructions for Patients Receiving Chemotherapy  Today you received the following chemotherapy agents;  Gemzar and Carboplatin.   To help prevent nausea and vomiting after your treatment, we encourage you to take your nausea medication as directed.    If you develop nausea and vomiting that is not controlled by your nausea medication, call the clinic.   BELOW ARE SYMPTOMS THAT SHOULD BE REPORTED IMMEDIATELY:  *FEVER GREATER THAN 100.5 F  *CHILLS WITH OR WITHOUT FEVER  NAUSEA AND VOMITING THAT IS NOT CONTROLLED WITH YOUR NAUSEA MEDICATION  *UNUSUAL SHORTNESS OF BREATH  *UNUSUAL BRUISING OR BLEEDING  TENDERNESS IN MOUTH AND THROAT WITH OR WITHOUT PRESENCE OF ULCERS  *URINARY PROBLEMS  *BOWEL PROBLEMS  UNUSUAL RASH Items with * indicate a potential emergency and should be followed up as soon as possible.  Feel free to call the clinic you have any questions or concerns. The clinic phone number is [phone].    

## 2013-08-17 NOTE — Telephone Encounter (Signed)
Per staff message and POF I have scheduled appts.  JMW  

## 2013-08-18 ENCOUNTER — Ambulatory Visit (HOSPITAL_BASED_OUTPATIENT_CLINIC_OR_DEPARTMENT_OTHER): Payer: No Typology Code available for payment source

## 2013-08-18 VITALS — BP 118/81 | HR 75 | Temp 98.2°F | Resp 20

## 2013-08-18 DIAGNOSIS — Z5189 Encounter for other specified aftercare: Secondary | ICD-10-CM

## 2013-08-18 DIAGNOSIS — C50219 Malignant neoplasm of upper-inner quadrant of unspecified female breast: Secondary | ICD-10-CM

## 2013-08-18 MED ORDER — PEGFILGRASTIM INJECTION 6 MG/0.6ML
6.0000 mg | Freq: Once | SUBCUTANEOUS | Status: AC
  Administered 2013-08-18: 6 mg via SUBCUTANEOUS

## 2013-08-24 ENCOUNTER — Other Ambulatory Visit (HOSPITAL_BASED_OUTPATIENT_CLINIC_OR_DEPARTMENT_OTHER): Payer: No Typology Code available for payment source | Admitting: Lab

## 2013-08-24 ENCOUNTER — Encounter: Payer: Self-pay | Admitting: Adult Health

## 2013-08-24 ENCOUNTER — Ambulatory Visit (HOSPITAL_BASED_OUTPATIENT_CLINIC_OR_DEPARTMENT_OTHER): Payer: No Typology Code available for payment source | Admitting: Adult Health

## 2013-08-24 VITALS — BP 118/83 | HR 103 | Temp 98.0°F | Resp 19 | Ht 65.0 in | Wt 231.3 lb

## 2013-08-24 DIAGNOSIS — C50212 Malignant neoplasm of upper-inner quadrant of left female breast: Secondary | ICD-10-CM

## 2013-08-24 DIAGNOSIS — C50219 Malignant neoplasm of upper-inner quadrant of unspecified female breast: Secondary | ICD-10-CM

## 2013-08-24 DIAGNOSIS — C50919 Malignant neoplasm of unspecified site of unspecified female breast: Secondary | ICD-10-CM

## 2013-08-24 LAB — COMPREHENSIVE METABOLIC PANEL (CC13)
BUN: 23.9 mg/dL (ref 7.0–26.0)
CO2: 27 mEq/L (ref 22–29)
Calcium: 9.4 mg/dL (ref 8.4–10.4)
Chloride: 103 mEq/L (ref 98–109)
Creatinine: 0.9 mg/dL (ref 0.6–1.1)

## 2013-08-24 LAB — CBC WITH DIFFERENTIAL/PLATELET
BASO%: 0 % (ref 0.0–2.0)
Basophils Absolute: 0 10*3/uL (ref 0.0–0.1)
Eosinophils Absolute: 0 10*3/uL (ref 0.0–0.5)
HCT: 28.5 % — ABNORMAL LOW (ref 34.8–46.6)
HGB: 9.2 g/dL — ABNORMAL LOW (ref 11.6–15.9)
MONO#: 0.8 10*3/uL (ref 0.1–0.9)
NEUT#: 48 10*3/uL — ABNORMAL HIGH (ref 1.5–6.5)
NEUT%: 95.3 % — ABNORMAL HIGH (ref 38.4–76.8)
WBC: 50.4 10*3/uL (ref 3.9–10.3)
lymph#: 1.5 10*3/uL (ref 0.9–3.3)

## 2013-08-24 NOTE — Patient Instructions (Addendum)
Doing well.  Try taking 10 minute leisure walks once a day for fatigue.  We will call you with your lab results.  Please call us if you have any questions or concerns.

## 2013-08-24 NOTE — Progress Notes (Signed)
OFFICE PROGRESS NOTE  CC**  Erika Flake, MD 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    PRIOR THERAPY: 1. 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 2 months ago this began getting larger rapidly. She was seen in an urgent care where this mass was thought to be an abscess. Apparently there was an attempt to excise or drain this that was not successful and she was referred to Dr. Dwain Sarna for evaluation. She underwent punch biopsy on 04/23/13 of her skin and the mass. She was then also sent for mm/us. The biopsy shows invasive high grade carcinoma that appears to be breast. It is ER 14%, PR negative, HER-2/neu negative with a Ki-67 that approaches 100%. She has also undergone mm with at least 10 cm upper inner left breast tumor with mild overyling skin thickening. There is also a 3x10 mm cluster of calcs near there also. There is indeterminate 7 mm level 1 left axillary node, she underwent a lymph node biopsy that was positive for disease. No mm evidence of right breast malignancy.  An MRI of the breasts was done on 05/22/13 and showed a 14 cm mass in the left breast protruding from the skin and also a 2.5 cm level II axillary node.   2.  Patient evaluated and began on neoadjuvant chemotherapy.  Adriamycin and Cytoxan began on 05/25/13.  Taxol carbo began on 8/8. 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  #3 staging PET/CT performed on 06/13/2013 revealed no evidence of distant disease.  #4 anemia of iron deficiency  CURRENT THERAPY: gemzar/Carbo cycle 2 day 8  INTERVAL HISTORY: Erika [name] 41 y.o. female returns for evaluation following her second cycle of neoadjuvant Gemzar/Carbo for her left breast cancer.  She is very fatigued today.   This waxes and wanes throughout her  treatment.  Her numbness remains the same in her feet. She also endorses bone pain in her back legs, and feet following Neulasta.  This resolved with Percocet.  She has occasional nausea relieved with anti-emetics.  Otherwise, she denies fevers, chills, vomiting, constipation, diarrhea.    MEDICAL HISTORY: Past Medical History  Diagnosis Date  . Breast cancer     Invasive High Grade Carcinoma  . Headache(784.0)     Hx: of Migraines  . Status post chemotherapy     Adriamycin and Cytoxan    ALLERGIES:  has No Known Allergies.  MEDICATIONS:  Current Outpatient Prescriptions  Medication Sig Dispense Refill  . ciprofloxacin (CIPRO) 500 MG tablet Take 1 tablet (500 mg total) by mouth 2 (two) times daily.  14 tablet  0  . dexamethasone (DECADRON) 4 MG tablet Take 8 mg by mouth See admin instructions. Take daily for 3 days starting day after chemo      . enoxaparin (LOVENOX) 150 MG/ML injection Inject 1 mL (150 mg total) into the skin daily.  30 Syringe  6  . fentaNYL (DURAGESIC - DOSED MCG/HR) 50 MCG/HR Place 1 patch (50 mcg total) onto the skin every 3 (three) days.  10 patch  0  . ibuprofen (ADVIL,MOTRIN) 200 MG tablet Take 600-800 mg by mouth every 4 (four) hours as needed for pain.       Marland Kitchen lidocaine-prilocaine (EMLA) cream Apply 1 application topically every 14 (fourteen) days. Every other Friday prior to  chemo      . LORazepam (ATIVAN) 0.5 MG tablet Take 1 tablet (0.5 mg total) by mouth every 6 (six) hours as needed for anxiety.  30 tablet  0  . oxyCODONE (OXY IR/ROXICODONE) 5 MG immediate release tablet Take 1 tablet (5 mg total) by mouth every 4 (four) hours as needed for pain.  60 tablet  0  . potassium chloride SA (K-DUR,KLOR-CON) 20 MEQ tablet Take 1 tablet (20 mEq total) by mouth 3 (three) times daily.  90 tablet  0  . zolpidem (AMBIEN) 5 MG tablet Take 1 tablet (5 mg total) by mouth at bedtime as needed for sleep.  30 tablet  0   No current facility-administered medications for this  visit.    SURGICAL HISTORY:  Past Surgical History  Procedure Laterality Date  . Tubal ligation    . Portacath placement Right 05/22/2013    Procedure: INSERTION PORT-A-CATH;  Surgeon: Emelia Loron, MD;  Location: [facility] OR;  Service: General;  Laterality: Right;  . Left breast needle core biopsy Left 05/21/13  . Left breast needle core  biopsy Left 06/22/13    REVIEW OF SYSTEMS:  A 10 point review of systems was conducted and is otherwise negative except for what is noted above.    PHYSICAL EXAMINATION: Blood pressure 118/83, pulse 103, temperature 98 F (36.7 C), temperature source Oral, resp. rate 19, height 5\' 5"  (1.651 m), weight 231 lb 4.8 oz (104.917 kg). Body mass index is 38.49 kg/(m^2). General: Patient is a well appearing female in no acute distress HEENT: PERRLA, sclerae anicteric no conjunctival pallor, MMM Neck: supple, no palpable adenopathy Lungs: clear to auscultation bilaterally, no wheezes, rhonchi, or rales Cardiovascular: regular rate rhythm, S1, S2, no murmurs, rubs or gallops Abdomen: Soft, non-tender, non-distended, normoactive bowel sounds, no HSM Extremities: warm and well perfused, no clubbing, cyanosis, or edema Skin: No rashes or lesions Neuro: Non-focal Breasts: left breast mass is much smaller, now hardly palpable, skin change from where mass ulcerated is evident with mild thickness, however lesion is much improved.  ECOG PERFORMANCE STATUS: 1 - Symptomatic but completely ambulatory  LABORATORY DATA: Lab Results  Component Value Date   WBC 50.4* 08/24/2013   HGB 9.2* 08/24/2013   HCT 28.5* 08/24/2013   MCV 88.0 08/24/2013   PLT 335 08/24/2013      Chemistry      Component Value Date/Time   NA 138 08/24/2013 1249   NA 139 05/18/2013 1205   K 3.7 08/24/2013 1249   K 3.8 05/18/2013 1205   CL 102 06/01/2013 1500   CL 103 05/18/2013 1205   CO2 27 08/24/2013 1249   CO2 28 05/18/2013 1205   BUN 23.9 08/24/2013 1249   BUN 8 05/18/2013 1205   CREATININE 0.9  08/24/2013 1249   CREATININE 0.84 05/18/2013 1205      Component Value Date/Time   CALCIUM 9.4 08/24/2013 1249   CALCIUM 9.1 05/18/2013 1205   ALKPHOS 202* 08/24/2013 1249   ALKPHOS 111 05/18/2013 1205   AST 14 08/24/2013 1249   AST 18 05/18/2013 1205   ALT 39 08/24/2013 1249   ALT 16 05/18/2013 1205   BILITOT <0.20 08/24/2013 1249   BILITOT 0.2* 05/18/2013 1205       RADIOGRAPHIC STUDIES:  Chest 2 View  05/18/2013   *RADIOLOGY REPORT*  Clinical Data: Breast carcinoma  CHEST - 2 VIEW  Comparison: None.  Findings: Probable left breast mass. Lungs clear.  Heart size and pulmonary vascularity normal.  No effusion.  Visualized bones unremarkable.  IMPRESSION: No acute disease   Original Report Authenticated By: D. Andria Rhein, MD   US Breast Left  05/08/2013   *RADIOLOGY REPORT*  Clinical Data:  42 year old female with large left breast mass - recent punch biopsy demonstrating high-grade carcinoma.  DIGITAL DIAGNOSTIC BILATERAL MAMMOGRAM WITH CAD AND LEFT BREAST ULTRASOUND:  Comparison:  None.  Findings:  ACR Breast Density Category 2: There is a scattered fibroglandular pattern.  A large irregular mass within the upper inner posterior left breast is identified - measuring at least 10 cm. Mild overlying skin thickening is identified compatible with dermal invasion. A 3 x 10 mm cluster of slightly heterogeneous left breast calcifications approximate 4 cm anterior and lateral to this mass is identified.  There is no evidence of suspicious mass, distortion or worrisome calcifications within the right breast. Mammographic images were processed with CAD.  On physical exam, a very large protuberant mass within the upper inner left breast is identified.  Ultrasound is performed, showing a large heterogeneous mass within the upper inner left breast at the 10-11 o'clock position, measuring at least 6.5 cm, but evaluation is limited by transducer skin area and protuberant portion of the mass. A 7 mm level I left axillary  lymph node is identified with slightly echogenic rim - suspicious.  IMPRESSION: Large biopsy-proven carcinoma within the upper inner left breast, measuring at least 10 cm mammographically. Mild overlying skin thickening compatible with dermal invasion.  3 x 10 mm cluster of indeterminate left breast calcifications 4 cm anterior and lateral to the biopsy-proven neoplasm.  This may represent DCIS and consider tissue sampling.  Indeterminate 7 mm level I left axillary lymph node - metastatic disease not excluded.  No mammographic evidence of right breast malignancy.  BI-RADS CATEGORY 6:  Known biopsy-proven malignancy - appropriate action should be taken.  RECOMMENDATION: Treatment plan.  Consider biopsies of the indeterminate left axillary lymph node and left breast calcifications as clinically indicated.  Consider bilateral breast MRI as clinically indicated.  I have discussed the findings and recommendations with the patient. Results were also provided in writing at the conclusion of the visit.   Original Report Authenticated By: Harmon Pier, M.D.   Mr Breast Bilateral W Wo Contrast  05/22/2013   *RADIOLOGY REPORT*  Clinical Data: large invasive carcinoma left upper inner quadrant with dermal involvement  BILATERAL BREAST MRI WITH AND WITHOUT CONTRAST  Technique: Multiplanar, multisequence MR images of both breasts were obtained prior to and following the intravenous administration of 20ml of Multihance.  Three dimensional images were evaluated at the independent DynaCad workstation.  Comparison:  05/08/13  Findings: There is moderate background parenchymal enhancement. The right breast and axilla are negative.  On the left, there is a large irregular mass with irregular borders in the upper inner quadrant, demonstrating heterogenous internal enhancement.  The mass measures 14 (AP x 7.5 (lateral) x 8 (craniocaudal)cm.  It causes protrusion of the skin of the breast in the 10 - 11 o'clock position, and it is  associated with edematous change in the skin of the left breast, primarily in the upper inner quadrant but also in the periareolar in more infra- areolar left breast.  The mass extends through pectoralis major musculature posteriorly, with enhancing mass extending all the way to the posterior edge of the muscle.  In the 2 o'clock position posteriorly there is a 9mm oval enhancing mass with a mixture of plateau and washout kinetics; it is an indeterminate finding but shows the  suggestion of a fatty hilum, and it may be an intramammary lymph node.  In the left axilla, there is an enlarged level II lymph node demonstrating hypervascularity and cortical thickening.  It measures 2.5cm in greatest dimension, and the appearance is concerning for metastasis.  There are no other abnormalities on the left.  IMPRESSION: Large left breast malignancy associated with involvement of the dermis and the chest wall, as well as with probable axillary lymph node metastasis.  There is an indeterminate 9mm mass in the 2 o'clock position, that may be an intramammary lymph node.  BI-RADS CATEGORY 6:  Known biopsy-proven malignancy - appropriate action should be taken.  THREE-DIMENSIONAL MR IMAGE RENDERING ON INDEPENDENT WORKSTATION:  Three-dimensional MR images were rendered by post-processing of the original MR data on an independent workstation.  The three- dimensional MR images were interpreted, and findings were reported in the accompanying complete MRI report for this study.   Original Report Authenticated By: Esperanza Heir, M.D.   Dg Chest Port 1 View  05/22/2013   *RADIOLOGY REPORT*  Clinical Data: Port-A-Cath placement.  PORTABLE CHEST - 1 VIEW  Comparison: 05/18/2013  Findings: Right-sided power injectable Port-A-Cath noted with distal tip projecting over the SVC.  Low lung volumes are present, causing crowding of the pulmonary vasculature.  No pneumothorax noted.  Bilateral midlung subsegmental atelectasis noted.  Borderline  cardiomegaly.  IMPRESSION:  1.  Power injectable catheter tip:  SVC.  No pneumothorax.   Original Report Authenticated By: Gaylyn Rong, M.D.   Mm Digital Diagnostic Bilat  05/08/2013   *RADIOLOGY REPORT*  Clinical Data:  42 year old female with large left breast mass - recent punch biopsy demonstrating high-grade carcinoma.  DIGITAL DIAGNOSTIC BILATERAL MAMMOGRAM WITH CAD AND LEFT BREAST ULTRASOUND:  Comparison:  None.  Findings:  ACR Breast Density Category 2: There is a scattered fibroglandular pattern.  A large irregular mass within the upper inner posterior left breast is identified - measuring at least 10 cm. Mild overlying skin thickening is identified compatible with dermal invasion. A 3 x 10 mm cluster of slightly heterogeneous left breast calcifications approximate 4 cm anterior and lateral to this mass is identified.  There is no evidence of suspicious mass, distortion or worrisome calcifications within the right breast. Mammographic images were processed with CAD.  On physical exam, a very large protuberant mass within the upper inner left breast is identified.  Ultrasound is performed, showing a large heterogeneous mass within the upper inner left breast at the 10-11 o'clock position, measuring at least 6.5 cm, but evaluation is limited by transducer skin area and protuberant portion of the mass. A 7 mm level I left axillary lymph node is identified with slightly echogenic rim - suspicious.  IMPRESSION: Large biopsy-proven carcinoma within the upper inner left breast, measuring at least 10 cm mammographically. Mild overlying skin thickening compatible with dermal invasion.  3 x 10 mm cluster of indeterminate left breast calcifications 4 cm anterior and lateral to the biopsy-proven neoplasm.  This may represent DCIS and consider tissue sampling.  Indeterminate 7 mm level I left axillary lymph node - metastatic disease not excluded.  No mammographic evidence of right breast malignancy.  BI-RADS  CATEGORY 6:  Known biopsy-proven malignancy - appropriate action should be taken.  RECOMMENDATION: Treatment plan.  Consider biopsies of the indeterminate left axillary lymph node and left breast calcifications as clinically indicated.  Consider bilateral breast MRI as clinically indicated.  I have discussed the findings and recommendations with the patient. Results were also provided  in writing at the conclusion of the visit.   Original Report Authenticated By: Harmon Pier, M.D.   Dg Fluoro Guide Cv Line-no Report  05/22/2013   CLINICAL DATA: surgery   FLOURO GUIDE CV LINE  Fluoroscopy was utilized by the requesting physician.  No radiographic  interpretation.    [place] Lt Breast Bx W Loc Dev 1st Lesion Img Bx Spec US Guide  05/22/2013   **ADDENDUM** CREATED: 05/22/2013 11:12:58  The patient was called by myself, Dr. Micheline Maze today.  The patient states she is doing well since her left axillary node biopsy without problems at the biopsy site.  The patient was given the results of her biopsy which was compatible with metastatic invasive mammary carcinoma.  This is concordant with imaging findings and patient's known history of recent positive biopsy of a large inner upper left breast mass demonstrating invasive mammary carcinoma. The patient had her breast MRI this morning and will receive Port-A- Cath placement by Dr. Dwain Sarna this afternoon.  Recommend follow- up pending MRI results and per treatment plan.  **END ADDENDUM** SIGNED BY: Melton Alar. Micheline Maze, M.D.  05/21/2013   *RADIOLOGY REPORT*  Clinical Data:  New diagnosis of invasive mammary carcinoma in the left upper inner quadrant.  Abnormal lymph node noted on ultrasound dated 05/08/2013.  ULTRASOUND GUIDED CORE BIOPSY OF THE LEFT AXILLA  The patient and I discussed the procedure of ultrasound-guided biopsy, including benefits and alternatives.  We discussed the high likelihood of a successful procedure. We discussed the risks of the procedure, including infection,  bleeding, tissue injury, clip migration, and inadequate sampling.  Written informed consent was given. Appropriate time-out was performed.  Using sterile technique, 2% lidocaine, ultrasound guidance, and a 14 gauge automated biopsy device, biopsy was performed of the abnormal left axillary lymph node using a lateromedial approach. At the conclusion of the procedure, a dumbbell shaped tissue marker clip was deployed into the biopsy cavity. Post biopsy mammogram was not performed.  IMPRESSION: Ultrasound guided biopsy of an abnormal left axillary lymph node. No apparent complications.  Original Report Authenticated By: Cain Saupe, M.D.    ASSESSMENT:   1. Patient is a 42 year old female with high grade invasive breast carcinoma ER 14%, PR negative, HER-2/neu negative.  She was evaluated by Dr. Dwain Sarna.  Neoadjuvant therapy was started, and the patient received 4 cycles of Adriamycin/Cytoxan.  She received one cycle of Taxol Carbo and developed neuropathic pain and was switched to Gemzar Carbo.  She will receive 6 cycles of therapy.     #2 patient had CT and PET scan performed. She has no evidence of metastatic disease distantly.  #3 she was begun on neoadjuvant chemotherapy consisting of Adriamycin Cytoxan given Q2 weekly beginning on 06/08/2013. She completed this regimen, and is now receiving Taxol/Carbo weekly. She received 1 cycle of this and it was discontinued due to progressive neuropathy.  She started Gemzar/Carbo on 8/22  #4 anemia due to menorrhagia. She is now status post therapy heme given on 06/13/2013.  #5 Pain controlled on Percocet and Fentanyl Patch. We did receive a letter from her insurance company due that reported she has had three providers prescribing her narcotics since April.  This was discussed with Mrs. Mengel and she will only receive pain medication from myself or Dr. Welton Flakes.   PLAN:  1. Patient is doing well following chemotherapy.  Labs continue to be pending, we will  call her with the results.    2.She will continue with potassium supplementation.  Her potassium has been stable.    3. She will continue on Lovenox therapy indefinitely.    4. Her pain is controlled with Percocet and fentanyl patches.   5. She will return in one week for cycle 3 of treatment.   All questions were answered. The patient knows to call the clinic with any problems, questions or concerns. We can certainly see the patient much sooner if necessary.  I spent 25 minutes counseling the patient face to face. The total time spent in the appointment was 30 minutes.  Cherie Ouch Lyn Hollingshead, NP Medical Oncology [facility] Phone: [phone] 08/25/2013, 11:46 AM

## 2013-08-31 ENCOUNTER — Encounter: Payer: Self-pay | Admitting: Adult Health

## 2013-08-31 ENCOUNTER — Telehealth: Payer: Self-pay | Admitting: Adult Health

## 2013-08-31 ENCOUNTER — Other Ambulatory Visit (HOSPITAL_BASED_OUTPATIENT_CLINIC_OR_DEPARTMENT_OTHER): Payer: No Typology Code available for payment source | Admitting: Lab

## 2013-08-31 ENCOUNTER — Ambulatory Visit (HOSPITAL_BASED_OUTPATIENT_CLINIC_OR_DEPARTMENT_OTHER): Payer: Medicaid Other

## 2013-08-31 ENCOUNTER — Ambulatory Visit (HOSPITAL_BASED_OUTPATIENT_CLINIC_OR_DEPARTMENT_OTHER): Payer: No Typology Code available for payment source | Admitting: Adult Health

## 2013-08-31 ENCOUNTER — Encounter: Payer: Self-pay | Admitting: *Deleted

## 2013-08-31 ENCOUNTER — Encounter: Payer: Self-pay | Admitting: Oncology

## 2013-08-31 VITALS — BP 121/87 | HR 96 | Temp 98.4°F | Resp 18 | Ht 65.0 in | Wt 234.7 lb

## 2013-08-31 DIAGNOSIS — I82409 Acute embolism and thrombosis of unspecified deep veins of unspecified lower extremity: Secondary | ICD-10-CM

## 2013-08-31 DIAGNOSIS — C50212 Malignant neoplasm of upper-inner quadrant of left female breast: Secondary | ICD-10-CM

## 2013-08-31 DIAGNOSIS — N920 Excessive and frequent menstruation with regular cycle: Secondary | ICD-10-CM

## 2013-08-31 DIAGNOSIS — C50219 Malignant neoplasm of upper-inner quadrant of unspecified female breast: Secondary | ICD-10-CM

## 2013-08-31 DIAGNOSIS — Z5111 Encounter for antineoplastic chemotherapy: Secondary | ICD-10-CM

## 2013-08-31 DIAGNOSIS — C773 Secondary and unspecified malignant neoplasm of axilla and upper limb lymph nodes: Secondary | ICD-10-CM

## 2013-08-31 DIAGNOSIS — G5690 Unspecified mononeuropathy of unspecified upper limb: Secondary | ICD-10-CM

## 2013-08-31 DIAGNOSIS — R52 Pain, unspecified: Secondary | ICD-10-CM

## 2013-08-31 DIAGNOSIS — D649 Anemia, unspecified: Secondary | ICD-10-CM

## 2013-08-31 LAB — COMPREHENSIVE METABOLIC PANEL (CC13)
ALT: 36 U/L (ref 0–55)
Alkaline Phosphatase: 152 U/L — ABNORMAL HIGH (ref 40–150)
BUN: 15.7 mg/dL (ref 7.0–26.0)
Chloride: 106 mEq/L (ref 98–109)
Glucose: 88 mg/dl (ref 70–140)
Potassium: 4.4 mEq/L (ref 3.5–5.1)
Total Protein: 7.3 g/dL (ref 6.4–8.3)

## 2013-08-31 LAB — CBC WITH DIFFERENTIAL/PLATELET
Basophils Absolute: 0 10*3/uL (ref 0.0–0.1)
EOS%: 0.1 % (ref 0.0–7.0)
HCT: 30.4 % — ABNORMAL LOW (ref 34.8–46.6)
HGB: 9.6 g/dL — ABNORMAL LOW (ref 11.6–15.9)
MCH: 28.7 pg (ref 25.1–34.0)
MCV: 91 fL (ref 79.5–101.0)
NEUT%: 84.1 % — ABNORMAL HIGH (ref 38.4–76.8)
lymph#: 1.6 10*3/uL (ref 0.9–3.3)

## 2013-08-31 MED ORDER — SODIUM CHLORIDE 0.9 % IV SOLN
299.2000 mg | Freq: Once | INTRAVENOUS | Status: AC
  Administered 2013-08-31: 300 mg via INTRAVENOUS
  Filled 2013-08-31: qty 30

## 2013-08-31 MED ORDER — DEXAMETHASONE SODIUM PHOSPHATE 10 MG/ML IJ SOLN
INTRAMUSCULAR | Status: AC
  Filled 2013-08-31: qty 1

## 2013-08-31 MED ORDER — ONDANSETRON 8 MG/NS 50 ML IVPB
INTRAVENOUS | Status: AC
  Filled 2013-08-31: qty 8

## 2013-08-31 MED ORDER — SODIUM CHLORIDE 0.9 % IV SOLN
800.0000 mg/m2 | Freq: Once | INTRAVENOUS | Status: AC
  Administered 2013-08-31: 1710 mg via INTRAVENOUS
  Filled 2013-08-31: qty 44.97

## 2013-08-31 MED ORDER — HEPARIN SOD (PORK) LOCK FLUSH 100 UNIT/ML IV SOLN
500.0000 [IU] | Freq: Once | INTRAVENOUS | Status: DC | PRN
  Filled 2013-08-31: qty 5

## 2013-08-31 MED ORDER — DEXAMETHASONE 4 MG PO TABS: 8.0000 mg | ORAL_TABLET | ORAL | Status: DC

## 2013-08-31 MED ORDER — DEXAMETHASONE SODIUM PHOSPHATE 10 MG/ML IJ SOLN
10.0000 mg | Freq: Once | INTRAMUSCULAR | Status: AC
  Administered 2013-08-31: 10 mg via INTRAVENOUS

## 2013-08-31 MED ORDER — OXYCODONE HCL 5 MG PO TABS: 5.0000 mg | ORAL_TABLET | ORAL | Status: DC | PRN

## 2013-08-31 MED ORDER — SODIUM CHLORIDE 0.9 % IJ SOLN
10.0000 mL | INTRAMUSCULAR | Status: DC | PRN
  Filled 2013-08-31: qty 10

## 2013-08-31 MED ORDER — SODIUM CHLORIDE 0.9 % IV SOLN
Freq: Once | INTRAVENOUS | Status: AC
  Administered 2013-08-31: 14:00:00 via INTRAVENOUS

## 2013-08-31 MED ORDER — ONDANSETRON 8 MG/50ML IVPB (CHCC)
8.0000 mg | Freq: Once | INTRAVENOUS | Status: AC
  Administered 2013-08-31: 8 mg via INTRAVENOUS

## 2013-08-31 MED ORDER — FENTANYL 75 MCG/HR TD PT72: 1.0000 | MEDICATED_PATCH | TRANSDERMAL | Status: DC

## 2013-08-31 NOTE — Patient Instructions (Addendum)
[facility] Discharge Instructions for Patients Receiving Chemotherapy  Today you received the following chemotherapy agents:  Carboplatin and Gemzar  To help prevent nausea and vomiting after your treatment, we encourage you to take your nausea medication as ordered per MD.   If you develop nausea and vomiting that is not controlled by your nausea medication, call the clinic.   BELOW ARE SYMPTOMS THAT SHOULD BE REPORTED IMMEDIATELY:  *FEVER GREATER THAN 100.5 F  *CHILLS WITH OR WITHOUT FEVER  NAUSEA AND VOMITING THAT IS NOT CONTROLLED WITH YOUR NAUSEA MEDICATION  *UNUSUAL SHORTNESS OF BREATH  *UNUSUAL BRUISING OR BLEEDING  TENDERNESS IN MOUTH AND THROAT WITH OR WITHOUT PRESENCE OF ULCERS  *URINARY PROBLEMS  *BOWEL PROBLEMS  UNUSUAL RASH Items with * indicate a potential emergency and should be followed up as soon as possible.  Feel free to call the clinic you have any questions or concerns. The clinic phone number is [phone].    

## 2013-08-31 NOTE — Progress Notes (Addendum)
OFFICE PROGRESS NOTE  CC**  Erika Flake, MD [address] 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    PRIOR THERAPY: 1. 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 2 months ago this began getting larger rapidly. She was seen in an urgent care where this mass was thought to be an abscess. Apparently there was an attempt to excise or drain this that was not successful and she was referred to Dr. Dwain Sarna for evaluation. She underwent punch biopsy on 04/23/13 of her skin and the mass. She was then also sent for mm/us. The biopsy shows invasive high grade carcinoma that appears to be breast. It is ER 14%, PR negative, HER-2/neu negative with a Ki-67 that approaches 100%. She has also undergone mm with at least 10 cm upper inner left breast tumor with mild overyling skin thickening. There is also a 3x10 mm cluster of calcs near there also. There is indeterminate 7 mm level 1 left axillary node, she underwent a lymph node biopsy that was positive for disease. No mm evidence of right breast malignancy.  An MRI of the breasts was done on 05/22/13 and showed a 14 cm mass in the left breast protruding from the skin and also a 2.5 cm level II axillary node.   2.  Patient evaluated and began on neoadjuvant chemotherapy.  Adriamycin and Cytoxan began on 05/25/13.  Taxol carbo began on 8/8. 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  #3 staging PET/CT performed on 06/13/2013 revealed no evidence of distant disease.  #4 anemia of iron deficiency  CURRENT THERAPY: gemzar/Carbo cycle 3 day 1  INTERVAL HISTORY: Erika [name] 42 y.o. female returns for evaluation prior to the third cycle of Gemzar/Carbo.  She is suffering from severe pain.  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  Her husband states that she is up at night and crying due to pain.  She wears a fentanyl patch and takes oxycodone prn.  After further questioning, she does reveal she takes her patch off occasionally to drive.  Otherwise, she denies fevers, chills, nausea, vomiting, constipation, diarrhea, or any further concerns.  She is complaining that her skin is raw and bleeding at her left chest wall.  Otherwise, a 10 point ROS is neg.   MEDICAL HISTORY: Past Medical History  Diagnosis Date  . Breast cancer     Invasive High Grade Carcinoma  . Headache(784.0)     Hx: of Migraines  . Status post chemotherapy     Adriamycin and Cytoxan    ALLERGIES:  has No Known Allergies.  MEDICATIONS:  Current Outpatient Prescriptions  Medication Sig Dispense Refill  . dexamethasone (DECADRON) 4 MG tablet Take 2 tablets (8 mg total) by mouth See admin instructions. Take daily for 3 days starting day after chemo  30 tablet  0  . enoxaparin (LOVENOX) 150 MG/ML injection Inject 1 mL (150 mg total) into the skin daily.  30 Syringe  6  . ibuprofen (ADVIL,MOTRIN) 200 MG tablet Take 600-800 mg by mouth every 4 (four) hours as needed for pain.       Marland Kitchen lidocaine-prilocaine (EMLA) cream Apply 1 application topically every 14 (fourteen) days. Every other Friday prior to chemo      . LORazepam (ATIVAN) 0.5 MG tablet  Take 1 tablet (0.5 mg total) by mouth every 6 (six) hours as needed for anxiety.  30 tablet  0  . oxyCODONE (OXY IR/ROXICODONE) 5 MG immediate release tablet Take 1 tablet (5 mg total) by mouth every 4 (four) hours as needed for pain.  60 tablet  0  . potassium chloride SA (K-DUR,KLOR-CON) 20 MEQ tablet Take 1 tablet (20 mEq total) by mouth 3 (three) times daily.  90 tablet  0  . zolpidem (AMBIEN) 5 MG tablet Take 1 tablet (5 mg total) by mouth at bedtime as needed for sleep.  30 tablet  0  . ciprofloxacin (CIPRO) 500 MG tablet Take 1 tablet (500 mg total) by mouth 2 (two) times daily.  14 tablet  0  .  fentaNYL (DURAGESIC - DOSED MCG/HR) 75 MCG/HR Place 1 patch (75 mcg total) onto the skin every 3 (three) days.  5 patch  0   No current facility-administered medications for this visit.    SURGICAL HISTORY:  Past Surgical History  Procedure Laterality Date  . Tubal ligation    . Portacath placement Right 05/22/2013    Procedure: INSERTION PORT-A-CATH;  Surgeon: Emelia Loron, MD;  Location: [facility] OR;  Service: General;  Laterality: Right;  . Left breast needle core biopsy Left 05/21/13  . Left breast needle core  biopsy Left 06/22/13    REVIEW OF SYSTEMS:  A 10 point review of systems was conducted and is otherwise negative except for what is noted above.    PHYSICAL EXAMINATION: Blood pressure 121/87, pulse 96, temperature 98.4 F (36.9 C), temperature source Oral, resp. rate 18, height 5\' 5"  (1.651 m), weight 234 lb 11.2 oz (106.459 kg). Body mass index is 39.06 kg/(m^2). General: Patient is a well appearing female in no acute distress HEENT: PERRLA, sclerae anicteric no conjunctival pallor, MMM Neck: supple, no palpable adenopathy Lungs: clear to auscultation bilaterally, no wheezes, rhonchi, or rales Cardiovascular: regular rate rhythm, S1, S2, no murmurs, rubs or gallops Abdomen: Soft, non-tender, non-distended, normoactive bowel sounds, no HSM Extremities: warm and well perfused, no clubbing, cyanosis, or edema Skin: No rashes or lesions Neuro: Non-focal Breasts: left breast mass is much smaller, now hardly palpable, skin change from where mass ulcerated is evident with mild thickness, however lesion is much improved.  Under the left breast there is a linear lesion about 4 cm that is raw and bleeding.   ECOG PERFORMANCE STATUS: 1 - Symptomatic but completely ambulatory  LABORATORY DATA: Lab Results  Component Value Date   WBC 16.0* 08/31/2013   HGB 9.6* 08/31/2013   HCT 30.4* 08/31/2013   MCV 91.0 08/31/2013   PLT 235 08/31/2013      Chemistry      Component Value Date/Time    NA 138 08/31/2013 1138   NA 139 05/18/2013 1205   K 4.4 08/31/2013 1138   K 3.8 05/18/2013 1205   CL 102 06/01/2013 1500   CL 103 05/18/2013 1205   CO2 23 08/31/2013 1138   CO2 28 05/18/2013 1205   BUN 15.7 08/31/2013 1138   BUN 8 05/18/2013 1205   CREATININE 0.8 08/31/2013 1138   CREATININE 0.84 05/18/2013 1205      Component Value Date/Time   CALCIUM 9.6 08/31/2013 1138   CALCIUM 9.1 05/18/2013 1205   ALKPHOS 152* 08/31/2013 1138   ALKPHOS 111 05/18/2013 1205   AST 22 08/31/2013 1138   AST 18 05/18/2013 1205   ALT 36 08/31/2013 1138   ALT 16 05/18/2013 1205   BILITOT <  0.20 08/31/2013 1138   BILITOT 0.2* 05/18/2013 1205       RADIOGRAPHIC STUDIES:  Chest 2 View  05/18/2013   *RADIOLOGY REPORT*  Clinical Data: Breast carcinoma  CHEST - 2 VIEW  Comparison: None.  Findings: Probable left breast mass. Lungs clear.  Heart size and pulmonary vascularity normal.  No effusion.  Visualized bones unremarkable.  IMPRESSION: No acute disease   Original Report Authenticated By: D. Andria Rhein, MD   US Breast Left  05/08/2013   *RADIOLOGY REPORT*  Clinical Data:  42 year old female with large left breast mass - recent punch biopsy demonstrating high-grade carcinoma.  DIGITAL DIAGNOSTIC BILATERAL MAMMOGRAM WITH CAD AND LEFT BREAST ULTRASOUND:  Comparison:  None.  Findings:  ACR Breast Density Category 2: There is a scattered fibroglandular pattern.  A large irregular mass within the upper inner posterior left breast is identified - measuring at least 10 cm. Mild overlying skin thickening is identified compatible with dermal invasion. A 3 x 10 mm cluster of slightly heterogeneous left breast calcifications approximate 4 cm anterior and lateral to this mass is identified.  There is no evidence of suspicious mass, distortion or worrisome calcifications within the right breast. Mammographic images were processed with CAD.  On physical exam, a very large protuberant mass within the upper inner left breast is identified.  Ultrasound  is performed, showing a large heterogeneous mass within the upper inner left breast at the 10-11 o'clock position, measuring at least 6.5 cm, but evaluation is limited by transducer skin area and protuberant portion of the mass. A 7 mm level I left axillary lymph node is identified with slightly echogenic rim - suspicious.  IMPRESSION: Large biopsy-proven carcinoma within the upper inner left breast, measuring at least 10 cm mammographically. Mild overlying skin thickening compatible with dermal invasion.  3 x 10 mm cluster of indeterminate left breast calcifications 4 cm anterior and lateral to the biopsy-proven neoplasm.  This may represent DCIS and consider tissue sampling.  Indeterminate 7 mm level I left axillary lymph node - metastatic disease not excluded.  No mammographic evidence of right breast malignancy.  BI-RADS CATEGORY 6:  Known biopsy-proven malignancy - appropriate action should be taken.  RECOMMENDATION: Treatment plan.  Consider biopsies of the indeterminate left axillary lymph node and left breast calcifications as clinically indicated.  Consider bilateral breast MRI as clinically indicated.  I have discussed the findings and recommendations with the patient. Results were also provided in writing at the conclusion of the visit.   Original Report Authenticated By: Harmon Pier, M.D.   Mr Breast Bilateral W Wo Contrast  05/22/2013   *RADIOLOGY REPORT*  Clinical Data: large invasive carcinoma left upper inner quadrant with dermal involvement  BILATERAL BREAST MRI WITH AND WITHOUT CONTRAST  Technique: Multiplanar, multisequence MR images of both breasts were obtained prior to and following the intravenous administration of 20ml of Multihance.  Three dimensional images were evaluated at the independent DynaCad workstation.  Comparison:  05/08/13  Findings: There is moderate background parenchymal enhancement. The right breast and axilla are negative.  On the left, there is a large irregular mass with  irregular borders in the upper inner quadrant, demonstrating heterogenous internal enhancement.  The mass measures 14 (AP x 7.5 (lateral) x 8 (craniocaudal)cm.  It causes protrusion of the skin of the breast in the 10 - 11 o'clock position, and it is associated with edematous change in the skin of the left breast, primarily in the upper inner quadrant but also in the periareolar  in more infra- areolar left breast.  The mass extends through pectoralis major musculature posteriorly, with enhancing mass extending all the way to the posterior edge of the muscle.  In the 2 o'clock position posteriorly there is a 9mm oval enhancing mass with a mixture of plateau and washout kinetics; it is an indeterminate finding but shows the suggestion of a fatty hilum, and it may be an intramammary lymph node.  In the left axilla, there is an enlarged level II lymph node demonstrating hypervascularity and cortical thickening.  It measures 2.5cm in greatest dimension, and the appearance is concerning for metastasis.  There are no other abnormalities on the left.  IMPRESSION: Large left breast malignancy associated with involvement of the dermis and the chest wall, as well as with probable axillary lymph node metastasis.  There is an indeterminate 9mm mass in the 2 o'clock position, that may be an intramammary lymph node.  BI-RADS CATEGORY 6:  Known biopsy-proven malignancy - appropriate action should be taken.  THREE-DIMENSIONAL MR IMAGE RENDERING ON INDEPENDENT WORKSTATION:  Three-dimensional MR images were rendered by post-processing of the original MR data on an independent workstation.  The three- dimensional MR images were interpreted, and findings were reported in the accompanying complete MRI report for this study.   Original Report Authenticated By: Esperanza Heir, M.D.   Dg Chest Port 1 View  05/22/2013   *RADIOLOGY REPORT*  Clinical Data: Port-A-Cath placement.  PORTABLE CHEST - 1 VIEW  Comparison: 05/18/2013  Findings:  Right-sided power injectable Port-A-Cath noted with distal tip projecting over the SVC.  Low lung volumes are present, causing crowding of the pulmonary vasculature.  No pneumothorax noted.  Bilateral midlung subsegmental atelectasis noted.  Borderline cardiomegaly.  IMPRESSION:  1.  Power injectable catheter tip:  SVC.  No pneumothorax.   Original Report Authenticated By: Gaylyn Rong, M.D.   Mm Digital Diagnostic Bilat  05/08/2013   *RADIOLOGY REPORT*  Clinical Data:  42 year old female with large left breast mass - recent punch biopsy demonstrating high-grade carcinoma.  DIGITAL DIAGNOSTIC BILATERAL MAMMOGRAM WITH CAD AND LEFT BREAST ULTRASOUND:  Comparison:  None.  Findings:  ACR Breast Density Category 2: There is a scattered fibroglandular pattern.  A large irregular mass within the upper inner posterior left breast is identified - measuring at least 10 cm. Mild overlying skin thickening is identified compatible with dermal invasion. A 3 x 10 mm cluster of slightly heterogeneous left breast calcifications approximate 4 cm anterior and lateral to this mass is identified.  There is no evidence of suspicious mass, distortion or worrisome calcifications within the right breast. Mammographic images were processed with CAD.  On physical exam, a very large protuberant mass within the upper inner left breast is identified.  Ultrasound is performed, showing a large heterogeneous mass within the upper inner left breast at the 10-11 o'clock position, measuring at least 6.5 cm, but evaluation is limited by transducer skin area and protuberant portion of the mass. A 7 mm level I left axillary lymph node is identified with slightly echogenic rim - suspicious.  IMPRESSION: Large biopsy-proven carcinoma within the upper inner left breast, measuring at least 10 cm mammographically. Mild overlying skin thickening compatible with dermal invasion.  3 x 10 mm cluster of indeterminate left breast calcifications 4 cm  anterior and lateral to the biopsy-proven neoplasm.  This may represent DCIS and consider tissue sampling.  Indeterminate 7 mm level I left axillary lymph node - metastatic disease not excluded.  No mammographic evidence of right breast  malignancy.  BI-RADS CATEGORY 6:  Known biopsy-proven malignancy - appropriate action should be taken.  RECOMMENDATION: Treatment plan.  Consider biopsies of the indeterminate left axillary lymph node and left breast calcifications as clinically indicated.  Consider bilateral breast MRI as clinically indicated.  I have discussed the findings and recommendations with the patient. Results were also provided in writing at the conclusion of the visit.   Original Report Authenticated By: Harmon Pier, M.D.   Dg Fluoro Guide Cv Line-no Report  05/22/2013   CLINICAL DATA: surgery   FLOURO GUIDE CV LINE  Fluoroscopy was utilized by the requesting physician.  No radiographic  interpretation.    [place] Lt Breast Bx W Loc Dev 1st Lesion Img Bx Spec US Guide  05/22/2013   **ADDENDUM** CREATED: 05/22/2013 11:12:58  The patient was called by myself, Dr. Micheline Maze today.  The patient states she is doing well since her left axillary node biopsy without problems at the biopsy site.  The patient was given the results of her biopsy which was compatible with metastatic invasive mammary carcinoma.  This is concordant with imaging findings and patient's known history of recent positive biopsy of a large inner upper left breast mass demonstrating invasive mammary carcinoma. The patient had her breast MRI this morning and will receive Port-A- Cath placement by Dr. Dwain Sarna this afternoon.  Recommend follow- up pending MRI results and per treatment plan.  **END ADDENDUM** SIGNED BY: Melton Alar. Micheline Maze, M.D.  05/21/2013   *RADIOLOGY REPORT*  Clinical Data:  New diagnosis of invasive mammary carcinoma in the left upper inner quadrant.  Abnormal lymph node noted on ultrasound dated 05/08/2013.  ULTRASOUND GUIDED CORE  BIOPSY OF THE LEFT AXILLA  The patient and I discussed the procedure of ultrasound-guided biopsy, including benefits and alternatives.  We discussed the high likelihood of a successful procedure. We discussed the risks of the procedure, including infection, bleeding, tissue injury, clip migration, and inadequate sampling.  Written informed consent was given. Appropriate time-out was performed.  Using sterile technique, 2% lidocaine, ultrasound guidance, and a 14 gauge automated biopsy device, biopsy was performed of the abnormal left axillary lymph node using a lateromedial approach. At the conclusion of the procedure, a dumbbell shaped tissue marker clip was deployed into the biopsy cavity. Post biopsy mammogram was not performed.  IMPRESSION: Ultrasound guided biopsy of an abnormal left axillary lymph node. No apparent complications.  Original Report Authenticated By: Cain Saupe, M.D.    ASSESSMENT:   1. Patient is a 42 year old female with high grade invasive breast carcinoma ER 14%, PR negative, HER-2/neu negative.  She was evaluated by Dr. Dwain Sarna.      #2 patient had CT and PET scan performed. She has no evidence of metastatic disease distantly.  #3 she was begun on neoadjuvant chemotherapy consisting of Adriamycin Cytoxan given Q2 weekly beginning on 06/08/2013. She completed this regimen, and is now receiving Taxol/Carbo weekly. She received 1 cycle of this and it was discontinued due to progressive neuropathy.  She started Gemzar/Carbo on 8/22. She will receive 6 cycles of this.    #4 anemia due to menorrhagia. She is now status post feraheme given on 06/13/2013.  #5 Pain controlled on Percocet and Fentanyl Patch. We did receive a letter from her insurance company due that reported she has had three providers prescribing her narcotics since April.  This was discussed with Erika [name] and she will only receive pain medication from myself or Dr. Welton Flakes.   PLAN:  1. Patient  is doing well.  I  reviewed her labs with her in detail.  She will proceed with chemotherapy today.      2.She will continue with potassium supplementation.  Her potassium has been stable.    3. She will continue on Lovenox therapy indefinitely.    4. Due to the increase in pain we increased her Fentanyl patches to , she will wear them continuously for 3 days, and not remove them.      5. She will return in one week for labs and evaluation of chemo toxicities following chemotherapy.    All questions were answered. The patient knows to call the clinic with any problems, questions or concerns. We can certainly see the patient much sooner if necessary.  I spent 25 minutes counseling the patient face to face. The total time spent in the appointment was 30 minutes.  Cherie Ouch Lyn Hollingshead, NP Medical Oncology [facility] Phone: [phone] 09/01/2013, 9:00 AM  ATTENDING'S ATTESTATION:  I personally reviewed patient's chart, examined patient myself, formulated the treatment plan as followed.    Patient is continuing Gemzar carboplatinum since 08/03/2013. She could not tolerate Taxol. Patient has significant pain syndrome. She is taking fentanyl patches. We are requesting her to continue the fentanyl patches on a regular basis and not take him off when she wants to drive. I do think this may be the reason why she has such poor control of her pain. She will proceed with her treatment today.  Drue Second, MD Medical/Oncology [facility] [phone] (beeper) [phone] (Office)  09/02/2013, 11:43 PM

## 2013-08-31 NOTE — Telephone Encounter (Signed)
  Called patient and verified that she takes Kdur po TID.  Today her potassium is 4.4.  She will decrease her Kdur to PO daily.  She verbalized understanding.   Will repeat at next appt.    Cherie Ouch Lyn Hollingshead, NP Medical Oncology [facility] Phone: [phone]

## 2013-08-31 NOTE — Progress Notes (Signed)
RECEIVED A FAX FROM [facility] CONCERNING A PRIOR AUTHORIZATION FOR ENOXAPARIN. THIS REQUEST WAS PLACED IN THE MANAGED CARE BIN. 

## 2013-08-31 NOTE — Progress Notes (Signed)
[place], [id number] opt [id number], approved lovenox 150mg  from 08/31/13-02/28/14.

## 2013-09-01 ENCOUNTER — Ambulatory Visit (HOSPITAL_BASED_OUTPATIENT_CLINIC_OR_DEPARTMENT_OTHER): Payer: No Typology Code available for payment source

## 2013-09-01 VITALS — BP 130/90 | HR 117 | Temp 98.0°F | Resp 18

## 2013-09-01 DIAGNOSIS — Z5189 Encounter for other specified aftercare: Secondary | ICD-10-CM

## 2013-09-01 DIAGNOSIS — C50219 Malignant neoplasm of upper-inner quadrant of unspecified female breast: Secondary | ICD-10-CM

## 2013-09-01 DIAGNOSIS — C50212 Malignant neoplasm of upper-inner quadrant of left female breast: Secondary | ICD-10-CM

## 2013-09-01 MED ORDER — PEGFILGRASTIM INJECTION 6 MG/0.6ML
6.0000 mg | Freq: Once | SUBCUTANEOUS | Status: AC
  Administered 2013-09-01: 6 mg via SUBCUTANEOUS

## 2013-09-07 ENCOUNTER — Encounter: Payer: Self-pay | Admitting: Adult Health

## 2013-09-07 ENCOUNTER — Telehealth: Payer: Self-pay | Admitting: Oncology

## 2013-09-07 ENCOUNTER — Other Ambulatory Visit (HOSPITAL_BASED_OUTPATIENT_CLINIC_OR_DEPARTMENT_OTHER): Payer: No Typology Code available for payment source | Admitting: Lab

## 2013-09-07 ENCOUNTER — Telehealth: Payer: Self-pay | Admitting: *Deleted

## 2013-09-07 ENCOUNTER — Ambulatory Visit (HOSPITAL_BASED_OUTPATIENT_CLINIC_OR_DEPARTMENT_OTHER): Payer: No Typology Code available for payment source | Admitting: Adult Health

## 2013-09-07 VITALS — BP 142/86 | HR 82 | Temp 98.5°F | Resp 20 | Ht 65.0 in | Wt 234.6 lb

## 2013-09-07 DIAGNOSIS — D500 Iron deficiency anemia secondary to blood loss (chronic): Secondary | ICD-10-CM

## 2013-09-07 DIAGNOSIS — C50212 Malignant neoplasm of upper-inner quadrant of left female breast: Secondary | ICD-10-CM

## 2013-09-07 DIAGNOSIS — C50219 Malignant neoplasm of upper-inner quadrant of unspecified female breast: Secondary | ICD-10-CM

## 2013-09-07 DIAGNOSIS — Z170 Estrogen receptor positive status [ER+]: Secondary | ICD-10-CM

## 2013-09-07 DIAGNOSIS — Z5111 Encounter for antineoplastic chemotherapy: Secondary | ICD-10-CM

## 2013-09-07 DIAGNOSIS — N920 Excessive and frequent menstruation with regular cycle: Secondary | ICD-10-CM

## 2013-09-07 DIAGNOSIS — M62838 Other muscle spasm: Secondary | ICD-10-CM

## 2013-09-07 DIAGNOSIS — I82409 Acute embolism and thrombosis of unspecified deep veins of unspecified lower extremity: Secondary | ICD-10-CM

## 2013-09-07 DIAGNOSIS — C773 Secondary and unspecified malignant neoplasm of axilla and upper limb lymph nodes: Secondary | ICD-10-CM

## 2013-09-07 LAB — COMPREHENSIVE METABOLIC PANEL (CC13)
AST: 17 U/L (ref 5–34)
BUN: 13.2 mg/dL (ref 7.0–26.0)
CO2: 25 mEq/L (ref 22–29)
Calcium: 9.8 mg/dL (ref 8.4–10.4)
Chloride: 103 mEq/L (ref 98–109)
Creatinine: 0.8 mg/dL (ref 0.6–1.1)
Glucose: 105 mg/dl (ref 70–140)

## 2013-09-07 LAB — CBC WITH DIFFERENTIAL/PLATELET
Basophils Absolute: 0.1 10*3/uL (ref 0.0–0.1)
EOS%: 0.1 % (ref 0.0–7.0)
HCT: 28.8 % — ABNORMAL LOW (ref 34.8–46.6)
HGB: 9.4 g/dL — ABNORMAL LOW (ref 11.6–15.9)
MCH: 29.8 pg (ref 25.1–34.0)
NEUT%: 94.3 % — ABNORMAL HIGH (ref 38.4–76.8)
lymph#: 1.3 10*3/uL (ref 0.9–3.3)

## 2013-09-07 MED ORDER — CYCLOBENZAPRINE HCL 5 MG PO TABS: 5.0000 mg | ORAL_TABLET | Freq: Three times a day (TID) | ORAL | Status: DC | PRN

## 2013-09-07 MED ORDER — GOSERELIN ACETATE 3.6 MG ~~LOC~~ IMPL
3.6000 mg | DRUG_IMPLANT | SUBCUTANEOUS | Status: DC
  Administered 2013-09-07: 3.6 mg via SUBCUTANEOUS
  Filled 2013-09-07: qty 3.6

## 2013-09-07 NOTE — Patient Instructions (Addendum)
Cyclobenzaprine tablets What is this medicine? CYCLOBENZAPRINE (sye kloe BEN za preen) is a muscle relaxer. It is used to treat muscle pain, spasms, and stiffness. This medicine may be used for other purposes; ask your health care provider or pharmacist if you have questions. What should I tell my health care provider before I take this medicine? They need to know if you have any of these conditions: -heart disease, irregular heartbeat, or previous heart attack -liver disease -thyroid problem -an unusual or allergic reaction to cyclobenzaprine, tricyclic antidepressants, lactose, other medicines, foods, dyes, or preservatives -pregnant or trying to get pregnant -breast-feeding How should I use this medicine? Take this medicine by mouth with a glass of water. Follow the directions on the prescription label. If this medicine upsets your stomach, take it with food or milk. Take your medicine at regular intervals. Do not take it more often than directed. Talk to your pediatrician regarding the use of this medicine in children. Special care may be needed. Overdosage: If you think you have taken too much of this medicine contact a poison control center or emergency room at once. NOTE: This medicine is only for you. Do not share this medicine with others. What if I miss a dose? If you miss a dose, take it as soon as you can. If it is almost time for your next dose, take only that dose. Do not take double or extra doses. What may interact with this medicine? Do not take this medicine with any of the following medications: -cisapride -droperidol -flecainide -grepafloxacin -halofantrine -levomethadyl -MAOIs like Carbex, Eldepryl, Marplan, Nardil, and Parnate -nilotinib -pimozide -probucol -sertindole This medicine may also interact with the following medications: -abarelix -alcohol -contrast dyes -dolasetron -guanethidine -medicines for cancer -medicines for depression, anxiety, or  psychotic disturbances -medicines to treat an irregular heartbeat -medicines used for sleep or numbness during surgery or procedure -methadone -octreotide -ondansetron -palonosetron -phenothiazines like chlorpromazine, mesoridazine, prochlorperazine, thioridazine -some medicines for infection like alfuzosin, chloroquine, clarithromycin, levofloxacin, mefloquine, pentamidine, troleandomycin -tramadol -vardenafil This list may not describe all possible interactions. Give your health care provider a list of all the medicines, herbs, non-prescription drugs, or dietary supplements you use. Also tell them if you smoke, drink alcohol, or use illegal drugs. Some items may interact with your medicine. What should I watch for while using this medicine? Check with your doctor or health care professional if your condition does not improve within 1 to 3 weeks. You may get drowsy or dizzy when you first start taking the medicine or change doses. Do not drive, use machinery, or do anything that may be dangerous until you know how the medicine affects you. Stand or sit up slowly. Your mouth may get dry. Drinking water, chewing sugarless gum, or sucking on hard candy may help. What side effects may I notice from receiving this medicine? Side effects that you should report to your doctor or health care professional as soon as possible: -allergic reactions like skin rash, itching or hives, swelling of the face, lips, or tongue -chest pain -fast heartbeat -hallucinations -seizures -vomiting Side effects that usually do not require medical attention (report to your doctor or health care professional if they continue or are bothersome): -headache This list may not describe all possible side effects. Call your doctor for medical advice about side effects. You may report side effects to FDA at 1-800-FDA-1088. Where should I keep my medicine? Keep out of the reach of children. Store at room temperature between  15 and 30 degrees   C (59 and 86 degrees F). Keep container tightly closed. Throw away any unused medicine after the expiration date. NOTE: This sheet is a summary. It may not cover all possible information. If you have questions about this medicine, talk to your doctor, pharmacist, or health care provider.  2013, [employer]vier/[employer]. (03/11/2008 10:26:21 PM)  

## 2013-09-07 NOTE — Progress Notes (Addendum)
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    PRIOR THERAPY: 1. 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 2 months ago this began getting larger rapidly. She was seen in an urgent care where this mass was thought to be an abscess. Apparently there was an attempt to excise or drain this that was not successful and she was referred to Dr. Dwain Sarna for evaluation. She underwent punch biopsy on 04/23/13 of her skin and the mass. She was then also sent for mm/us. The biopsy shows invasive high grade carcinoma that appears to be breast. It is ER 14%, PR negative, HER-2/neu negative with a Ki-67 that approaches 100%. She has also undergone mm with at least 10 cm upper inner left breast tumor with mild overyling skin thickening. There is also a 3x10 mm cluster of calcs near there also. There is indeterminate 7 mm level 1 left axillary node, she underwent a lymph node biopsy that was positive for disease. No mm evidence of right breast malignancy.  An MRI of the breasts was done on 05/22/13 and showed a 14 cm mass in the left breast protruding from the skin and also a 2.5 cm level II axillary node.   2.  Patient evaluated and began on neoadjuvant chemotherapy.  Adriamycin and Cytoxan began on 05/25/13.  She completed 4 cycles with Neulasta support.   Taxol carbo began on 8/8. 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  #3 staging PET/CT performed on 06/13/2013 revealed no evidence of distant disease.  #4 anemia of iron deficiency  CURRENT THERAPY: gemzar/Carbo cycle 3 day 8  INTERVAL HISTORY: Erika [name] 42 y.o. female returns for evaluation following receiving her third cycle of Gemzar/Carbo.  She's doing well today.  She is taking the increased  dose of fentanyl patches and is doing well with this. Her pain is better on the increased dose.  She is slightly more nauseated, however this is relieved with anti-emetics.  She has started walking and is enjoying this.  She has occasional muscle spasms in her back and her feet associated with cramping.  It causes sever pain.  Otherwise, she is well and w/o questions/concerns.  Denies fevers, chills, constipation, diarrhea, or further concerns.    MEDICAL HISTORY: Past Medical History  Diagnosis Date  . Breast cancer     Invasive High Grade Carcinoma  . Headache(784.0)     Hx: of Migraines  . Status post chemotherapy     Adriamycin and Cytoxan    ALLERGIES:  has No Known Allergies.  MEDICATIONS:  Current Outpatient Prescriptions  Medication Sig Dispense Refill  . ciprofloxacin (CIPRO) 500 MG tablet Take 1 tablet (500 mg total) by mouth 2 (two) times daily.  14 tablet  0  . dexamethasone (DECADRON) 4 MG tablet Take 2 tablets (8 mg total) by mouth See admin instructions. Take daily for 3 days starting day after chemo  30 tablet  0  . enoxaparin (LOVENOX) 150 MG/ML injection Inject 1 mL (150 mg total) into the skin daily.  30 Syringe  6  . fentaNYL (DURAGESIC - DOSED MCG/HR) 75 MCG/HR Place 1 patch (75 mcg total) onto the skin every 3 (three) days.  5 patch  0  . ibuprofen (ADVIL,MOTRIN) 200 MG tablet  Take 600-800 mg by mouth every 4 (four) hours as needed for pain.       Marland Kitchen lidocaine-prilocaine (EMLA) cream Apply 1 application topically every 14 (fourteen) days. Every other Friday prior to chemo      . LORazepam (ATIVAN) 0.5 MG tablet Take 1 tablet (0.5 mg total) by mouth every 6 (six) hours as needed for anxiety.  30 tablet  0  . oxyCODONE (OXY IR/ROXICODONE) 5 MG immediate release tablet Take 1 tablet (5 mg total) by mouth every 4 (four) hours as needed for pain.  60 tablet  0  . potassium chloride SA (K-DUR,KLOR-CON) 20 MEQ tablet Take 1 tablet (20 mEq total) by mouth 3 (three) times daily.   90 tablet  0  . zolpidem (AMBIEN) 5 MG tablet Take 1 tablet (5 mg total) by mouth at bedtime as needed for sleep.  30 tablet  0  . cyclobenzaprine (FLEXERIL) 5 MG tablet Take 1 tablet (5 mg total) by mouth 3 (three) times daily as needed for muscle spasms.  30 tablet  0   Current Facility-Administered Medications  Medication Dose Route Frequency Provider Last Rate Last Dose  . goserelin (ZOLADEX) injection 3.6 mg  3.6 mg Subcutaneous Q28 days Victorino December, MD   3.6 mg at 09/07/13 1241    SURGICAL HISTORY:  Past Surgical History  Procedure Laterality Date  . Tubal ligation    . Portacath placement Right 05/22/2013    Procedure: INSERTION PORT-A-CATH;  Surgeon: Emelia Loron, MD;  Location: [facility] OR;  Service: General;  Laterality: Right;  . Left breast needle core biopsy Left 05/21/13  . Left breast needle core  biopsy Left 06/22/13    REVIEW OF SYSTEMS:  A 10 point review of systems was conducted and is otherwise negative except for what is noted above.    PHYSICAL EXAMINATION: Blood pressure 142/86, pulse 82, temperature 98.5 F (36.9 C), temperature source Oral, resp. rate 20, height 5\' 5"  (1.651 m), weight 234 lb 9.6 oz (106.414 kg). Body mass index is 39.04 kg/(m^2). General: Patient is a well appearing female in no acute distress HEENT: PERRLA, sclerae anicteric no conjunctival pallor, MMM Neck: supple, no palpable adenopathy Lungs: clear to auscultation bilaterally, no wheezes, rhonchi, or rales Cardiovascular: regular rate rhythm, S1, S2, no murmurs, rubs or gallops Abdomen: Soft, non-tender, non-distended, normoactive bowel sounds, no HSM Extremities: warm and well perfused, no clubbing, cyanosis, or edema Skin: No rashes or lesions Neuro: Non-focal Breasts: left breast mass is much smaller, now hardly palpable, skin change from where mass ulcerated is evident with mild thickness, however lesion is much improved.  Under the left breast there is a linear lesion about 3 cm  that is raw, bleeding is improved, and it is healing as compared from last week.   ECOG PERFORMANCE STATUS: 1 - Symptomatic but completely ambulatory  LABORATORY DATA: Lab Results  Component Value Date   WBC 33.5* 09/07/2013   HGB 9.4* 09/07/2013   HCT 28.8* 09/07/2013   MCV 91.4 09/07/2013   PLT 327 09/07/2013      Chemistry      Component Value Date/Time   NA 138 09/07/2013 1125   NA 139 05/18/2013 1205   K 3.9 09/07/2013 1125   K 3.8 05/18/2013 1205   CL 102 06/01/2013 1500   CL 103 05/18/2013 1205   CO2 25 09/07/2013 1125   CO2 28 05/18/2013 1205   BUN 13.2 09/07/2013 1125   BUN 8 05/18/2013 1205   CREATININE 0.8 09/07/2013  1125   CREATININE 0.84 05/18/2013 1205      Component Value Date/Time   CALCIUM 9.8 09/07/2013 1125   CALCIUM 9.1 05/18/2013 1205   ALKPHOS 229* 09/07/2013 1125   ALKPHOS 111 05/18/2013 1205   AST 17 09/07/2013 1125   AST 18 05/18/2013 1205   ALT 40 09/07/2013 1125   ALT 16 05/18/2013 1205   BILITOT <0.20 09/07/2013 1125   BILITOT 0.2* 05/18/2013 1205       RADIOGRAPHIC STUDIES:  Chest 2 View  05/18/2013   *RADIOLOGY REPORT*  Clinical Data: Breast carcinoma  CHEST - 2 VIEW  Comparison: None.  Findings: Probable left breast mass. Lungs clear.  Heart size and pulmonary vascularity normal.  No effusion.  Visualized bones unremarkable.  IMPRESSION: No acute disease   Original Report Authenticated By: D. Andria Rhein, MD   US Breast Left  05/08/2013   *RADIOLOGY REPORT*  Clinical Data:  42 year old female with large left breast mass - recent punch biopsy demonstrating high-grade carcinoma.  DIGITAL DIAGNOSTIC BILATERAL MAMMOGRAM WITH CAD AND LEFT BREAST ULTRASOUND:  Comparison:  None.  Findings:  ACR Breast Density Category 2: There is a scattered fibroglandular pattern.  A large irregular mass within the upper inner posterior left breast is identified - measuring at least 10 cm. Mild overlying skin thickening is identified compatible with dermal invasion. A 3 x 10 mm cluster of slightly  heterogeneous left breast calcifications approximate 4 cm anterior and lateral to this mass is identified.  There is no evidence of suspicious mass, distortion or worrisome calcifications within the right breast. Mammographic images were processed with CAD.  On physical exam, a very large protuberant mass within the upper inner left breast is identified.  Ultrasound is performed, showing a large heterogeneous mass within the upper inner left breast at the 10-11 o'clock position, measuring at least 6.5 cm, but evaluation is limited by transducer skin area and protuberant portion of the mass. A 7 mm level I left axillary lymph node is identified with slightly echogenic rim - suspicious.  IMPRESSION: Large biopsy-proven carcinoma within the upper inner left breast, measuring at least 10 cm mammographically. Mild overlying skin thickening compatible with dermal invasion.  3 x 10 mm cluster of indeterminate left breast calcifications 4 cm anterior and lateral to the biopsy-proven neoplasm.  This may represent DCIS and consider tissue sampling.  Indeterminate 7 mm level I left axillary lymph node - metastatic disease not excluded.  No mammographic evidence of right breast malignancy.  BI-RADS CATEGORY 6:  Known biopsy-proven malignancy - appropriate action should be taken.  RECOMMENDATION: Treatment plan.  Consider biopsies of the indeterminate left axillary lymph node and left breast calcifications as clinically indicated.  Consider bilateral breast MRI as clinically indicated.  I have discussed the findings and recommendations with the patient. Results were also provided in writing at the conclusion of the visit.   Original Report Authenticated By: Harmon Pier, M.D.   Mr Breast Bilateral W Wo Contrast  05/22/2013   *RADIOLOGY REPORT*  Clinical Data: large invasive carcinoma left upper inner quadrant with dermal involvement  BILATERAL BREAST MRI WITH AND WITHOUT CONTRAST  Technique: Multiplanar, multisequence MR images  of both breasts were obtained prior to and following the intravenous administration of 20ml of Multihance.  Three dimensional images were evaluated at the independent DynaCad workstation.  Comparison:  05/08/13  Findings: There is moderate background parenchymal enhancement. The right breast and axilla are negative.  On the left, there is a large irregular mass  with irregular borders in the upper inner quadrant, demonstrating heterogenous internal enhancement.  The mass measures 14 (AP x 7.5 (lateral) x 8 (craniocaudal)cm.  It causes protrusion of the skin of the breast in the 10 - 11 o'clock position, and it is associated with edematous change in the skin of the left breast, primarily in the upper inner quadrant but also in the periareolar in more infra- areolar left breast.  The mass extends through pectoralis major musculature posteriorly, with enhancing mass extending all the way to the posterior edge of the muscle.  In the 2 o'clock position posteriorly there is a 9mm oval enhancing mass with a mixture of plateau and washout kinetics; it is an indeterminate finding but shows the suggestion of a fatty hilum, and it may be an intramammary lymph node.  In the left axilla, there is an enlarged level II lymph node demonstrating hypervascularity and cortical thickening.  It measures 2.5cm in greatest dimension, and the appearance is concerning for metastasis.  There are no other abnormalities on the left.  IMPRESSION: Large left breast malignancy associated with involvement of the dermis and the chest wall, as well as with probable axillary lymph node metastasis.  There is an indeterminate 9mm mass in the 2 o'clock position, that may be an intramammary lymph node.  BI-RADS CATEGORY 6:  Known biopsy-proven malignancy - appropriate action should be taken.  THREE-DIMENSIONAL MR IMAGE RENDERING ON INDEPENDENT WORKSTATION:  Three-dimensional MR images were rendered by post-processing of the original MR data on an  independent workstation.  The three- dimensional MR images were interpreted, and findings were reported in the accompanying complete MRI report for this study.   Original Report Authenticated By: Esperanza Heir, M.D.   Dg Chest Port 1 View  05/22/2013   *RADIOLOGY REPORT*  Clinical Data: Port-A-Cath placement.  PORTABLE CHEST - 1 VIEW  Comparison: 05/18/2013  Findings: Right-sided power injectable Port-A-Cath noted with distal tip projecting over the SVC.  Low lung volumes are present, causing crowding of the pulmonary vasculature.  No pneumothorax noted.  Bilateral midlung subsegmental atelectasis noted.  Borderline cardiomegaly.  IMPRESSION:  1.  Power injectable catheter tip:  SVC.  No pneumothorax.   Original Report Authenticated By: Gaylyn Rong, M.D.   Mm Digital Diagnostic Bilat  05/08/2013   *RADIOLOGY REPORT*  Clinical Data:  42 year old female with large left breast mass - recent punch biopsy demonstrating high-grade carcinoma.  DIGITAL DIAGNOSTIC BILATERAL MAMMOGRAM WITH CAD AND LEFT BREAST ULTRASOUND:  Comparison:  None.  Findings:  ACR Breast Density Category 2: There is a scattered fibroglandular pattern.  A large irregular mass within the upper inner posterior left breast is identified - measuring at least 10 cm. Mild overlying skin thickening is identified compatible with dermal invasion. A 3 x 10 mm cluster of slightly heterogeneous left breast calcifications approximate 4 cm anterior and lateral to this mass is identified.  There is no evidence of suspicious mass, distortion or worrisome calcifications within the right breast. Mammographic images were processed with CAD.  On physical exam, a very large protuberant mass within the upper inner left breast is identified.  Ultrasound is performed, showing a large heterogeneous mass within the upper inner left breast at the 10-11 o'clock position, measuring at least 6.5 cm, but evaluation is limited by transducer skin area and protuberant  portion of the mass. A 7 mm level I left axillary lymph node is identified with slightly echogenic rim - suspicious.  IMPRESSION: Large biopsy-proven carcinoma within the upper inner left  breast, measuring at least 10 cm mammographically. Mild overlying skin thickening compatible with dermal invasion.  3 x 10 mm cluster of indeterminate left breast calcifications 4 cm anterior and lateral to the biopsy-proven neoplasm.  This may represent DCIS and consider tissue sampling.  Indeterminate 7 mm level I left axillary lymph node - metastatic disease not excluded.  No mammographic evidence of right breast malignancy.  BI-RADS CATEGORY 6:  Known biopsy-proven malignancy - appropriate action should be taken.  RECOMMENDATION: Treatment plan.  Consider biopsies of the indeterminate left axillary lymph node and left breast calcifications as clinically indicated.  Consider bilateral breast MRI as clinically indicated.  I have discussed the findings and recommendations with the patient. Results were also provided in writing at the conclusion of the visit.   Original Report Authenticated By: Harmon Pier, M.D.   Dg Fluoro Guide Cv Line-no Report  05/22/2013   CLINICAL DATA: surgery   FLOURO GUIDE CV LINE  Fluoroscopy was utilized by the requesting physician.  No radiographic  interpretation.    [place] Lt Breast Bx W Loc Dev 1st Lesion Img Bx Spec US Guide  05/22/2013   **ADDENDUM** CREATED: 05/22/2013 11:12:58  The patient was called by myself, Dr. Micheline Maze today.  The patient states she is doing well since her left axillary node biopsy without problems at the biopsy site.  The patient was given the results of her biopsy which was compatible with metastatic invasive mammary carcinoma.  This is concordant with imaging findings and patient's known history of recent positive biopsy of a large inner upper left breast mass demonstrating invasive mammary carcinoma. The patient had her breast MRI this morning and will receive Port-A- Cath  placement by Dr. Dwain Sarna this afternoon.  Recommend follow- up pending MRI results and per treatment plan.  **END ADDENDUM** SIGNED BY: Melton Alar. Micheline Maze, M.D.  05/21/2013   *RADIOLOGY REPORT*  Clinical Data:  New diagnosis of invasive mammary carcinoma in the left upper inner quadrant.  Abnormal lymph node noted on ultrasound dated 05/08/2013.  ULTRASOUND GUIDED CORE BIOPSY OF THE LEFT AXILLA  The patient and I discussed the procedure of ultrasound-guided biopsy, including benefits and alternatives.  We discussed the high likelihood of a successful procedure. We discussed the risks of the procedure, including infection, bleeding, tissue injury, clip migration, and inadequate sampling.  Written informed consent was given. Appropriate time-out was performed.  Using sterile technique, 2% lidocaine, ultrasound guidance, and a 14 gauge automated biopsy device, biopsy was performed of the abnormal left axillary lymph node using a lateromedial approach. At the conclusion of the procedure, a dumbbell shaped tissue marker clip was deployed into the biopsy cavity. Post biopsy mammogram was not performed.  IMPRESSION: Ultrasound guided biopsy of an abnormal left axillary lymph node. No apparent complications.  Original Report Authenticated By: Cain Saupe, M.D.    ASSESSMENT:   1. Patient is a 41 year old female with high grade invasive breast carcinoma ER 14%, PR negative, HER-2/neu negative.  She was evaluated by Dr. Dwain Sarna.      #2 patient had CT and PET scan performed. She has no evidence of metastatic disease distantly.  #3 she was begun on neoadjuvant chemotherapy consisting of Adriamycin Cytoxan given Q2 weekly beginning on 06/08/2013. She completed this regimen, and is now receiving Taxol/Carbo weekly. She received 1 cycle of this and it was discontinued due to progressive neuropathy.  She started Gemzar/Carbo on 8/22. She will receive 6 cycles of this.    #4 anemia due to  menorrhagia. She is now  status post feraheme given on 06/13/2013.  #5 Pain controlled on Percocet and Fentanyl Patch. We did receive a letter from her insurance company due that reported she has had three providers prescribing her narcotics since April.  This was discussed with Mrs. Britten and she will only receive pain medication from myself or Dr. Welton Flakes.   PLAN:  1. Patient is doing well.  I reviewed her labs with her in detail. They are stable.  She will receive Zoladex today. She will also have her dressing changed on the raw lesion underneath her breast, it is improving.    2.She will continue with potassium supplementation.  Her potassium has been stable.    3. She will continue on Lovenox therapy indefinitely.    4. Patient's pain is improved with fentanyl patches.      5. She will return in one week for labs and evaluation prior to cycle four of chemotherapy.    All questions were answered. The patient knows to call the clinic with any problems, questions or concerns. We can certainly see the patient much sooner if necessary.  I spent 25 minutes counseling the patient face to face. The total time spent in the appointment was 30 minutes.  Cherie Ouch Lyn Hollingshead, NP Medical Oncology [facility] Phone: [phone] 09/08/2013, 9:17 AM   ATTENDING'S ATTESTATION:  I personally reviewed patient's chart, examined patient myself, formulated the treatment plan as followed.    Overall patient is doing well continues to tolerate chemotherapy very nicely. Her next cycle will be next week. She is on Zoladex. A breast lesion is improving. Pain is well-controlled with final patches. For her DVT she is on Lovenox. She be seen back in one week's time for followup.  Drue Second, MD Medical/Oncology [facility] [phone] (beeper) [phone] (Office)  10/01/2013, 9:55 AM

## 2013-09-07 NOTE — Telephone Encounter (Signed)
PE staff message I have adjusted 10/17 appt

## 2013-09-07 NOTE — Telephone Encounter (Signed)
, °

## 2013-09-07 NOTE — Progress Notes (Signed)
Tegaderm dressing with vaseline gauze and 2x2 gauze applied to left breast site during office appt today. Site clean, no oozing or signs of infection. No swelling and patient states she changes dressing at home twice a day and applies bactrim cream to the area.Patient encouraged to keep area clean to call office should she have any questions or concerns.

## 2013-09-14 ENCOUNTER — Ambulatory Visit (HOSPITAL_BASED_OUTPATIENT_CLINIC_OR_DEPARTMENT_OTHER): Payer: No Typology Code available for payment source | Admitting: Adult Health

## 2013-09-14 ENCOUNTER — Other Ambulatory Visit (HOSPITAL_BASED_OUTPATIENT_CLINIC_OR_DEPARTMENT_OTHER): Payer: No Typology Code available for payment source | Admitting: Lab

## 2013-09-14 ENCOUNTER — Telehealth: Payer: Self-pay | Admitting: *Deleted

## 2013-09-14 ENCOUNTER — Encounter: Payer: Self-pay | Admitting: Adult Health

## 2013-09-14 ENCOUNTER — Telehealth: Payer: Self-pay | Admitting: Oncology

## 2013-09-14 ENCOUNTER — Ambulatory Visit (HOSPITAL_BASED_OUTPATIENT_CLINIC_OR_DEPARTMENT_OTHER): Payer: No Typology Code available for payment source

## 2013-09-14 VITALS — BP 112/70 | HR 116 | Temp 98.5°F | Resp 20 | Ht 65.0 in | Wt 236.1 lb

## 2013-09-14 DIAGNOSIS — C50219 Malignant neoplasm of upper-inner quadrant of unspecified female breast: Secondary | ICD-10-CM

## 2013-09-14 DIAGNOSIS — N920 Excessive and frequent menstruation with regular cycle: Secondary | ICD-10-CM

## 2013-09-14 DIAGNOSIS — C50212 Malignant neoplasm of upper-inner quadrant of left female breast: Secondary | ICD-10-CM

## 2013-09-14 DIAGNOSIS — D500 Iron deficiency anemia secondary to blood loss (chronic): Secondary | ICD-10-CM

## 2013-09-14 DIAGNOSIS — Z170 Estrogen receptor positive status [ER+]: Secondary | ICD-10-CM

## 2013-09-14 DIAGNOSIS — Z7901 Long term (current) use of anticoagulants: Secondary | ICD-10-CM

## 2013-09-14 DIAGNOSIS — Z5111 Encounter for antineoplastic chemotherapy: Secondary | ICD-10-CM

## 2013-09-14 LAB — COMPREHENSIVE METABOLIC PANEL (CC13)
Albumin: 3.1 g/dL — ABNORMAL LOW (ref 3.5–5.0)
BUN: 4.1 mg/dL — ABNORMAL LOW (ref 7.0–26.0)
Calcium: 9.5 mg/dL (ref 8.4–10.4)
Chloride: 109 mEq/L (ref 98–109)
Glucose: 104 mg/dl (ref 70–140)
Potassium: 3.8 mEq/L (ref 3.5–5.1)
Sodium: 141 mEq/L (ref 136–145)
Total Protein: 7.4 g/dL (ref 6.4–8.3)

## 2013-09-14 LAB — CBC WITH DIFFERENTIAL/PLATELET
Basophils Absolute: 0 10*3/uL (ref 0.0–0.1)
Eosinophils Absolute: 0 10*3/uL (ref 0.0–0.5)
HCT: 32.9 % — ABNORMAL LOW (ref 34.8–46.6)
HGB: 10.5 g/dL — ABNORMAL LOW (ref 11.6–15.9)
MONO#: 0.6 10*3/uL (ref 0.1–0.9)
NEUT#: 5.7 10*3/uL (ref 1.5–6.5)
NEUT%: 71.5 % (ref 38.4–76.8)
WBC: 8 10*3/uL (ref 3.9–10.3)
lymph#: 1.6 10*3/uL (ref 0.9–3.3)

## 2013-09-14 MED ORDER — DEXAMETHASONE SODIUM PHOSPHATE 10 MG/ML IJ SOLN
10.0000 mg | Freq: Once | INTRAMUSCULAR | Status: AC
  Administered 2013-09-14: 10 mg via INTRAVENOUS

## 2013-09-14 MED ORDER — ONDANSETRON 8 MG/NS 50 ML IVPB
INTRAVENOUS | Status: AC
  Filled 2013-09-14: qty 8

## 2013-09-14 MED ORDER — SODIUM CHLORIDE 0.9 % IV SOLN
Freq: Once | INTRAVENOUS | Status: AC
  Administered 2013-09-14: 13:00:00 via INTRAVENOUS

## 2013-09-14 MED ORDER — SODIUM CHLORIDE 0.9 % IV SOLN
299.2000 mg | Freq: Once | INTRAVENOUS | Status: AC
  Administered 2013-09-14: 300 mg via INTRAVENOUS
  Filled 2013-09-14: qty 30

## 2013-09-14 MED ORDER — SODIUM CHLORIDE 0.9 % IV SOLN
800.0000 mg/m2 | Freq: Once | INTRAVENOUS | Status: AC
  Administered 2013-09-14: 1710 mg via INTRAVENOUS
  Filled 2013-09-14: qty 44.97

## 2013-09-14 MED ORDER — DEXAMETHASONE SODIUM PHOSPHATE 10 MG/ML IJ SOLN
INTRAMUSCULAR | Status: AC
  Filled 2013-09-14: qty 1

## 2013-09-14 MED ORDER — HEPARIN SOD (PORK) LOCK FLUSH 100 UNIT/ML IV SOLN
500.0000 [IU] | Freq: Once | INTRAVENOUS | Status: AC | PRN
  Administered 2013-09-14: 500 [IU]
  Filled 2013-09-14: qty 5

## 2013-09-14 MED ORDER — SODIUM CHLORIDE 0.9 % IJ SOLN
10.0000 mL | INTRAMUSCULAR | Status: DC | PRN
  Administered 2013-09-14: 10 mL
  Filled 2013-09-14: qty 10

## 2013-09-14 MED ORDER — FENTANYL 75 MCG/HR TD PT72: 1.0000 | MEDICATED_PATCH | TRANSDERMAL | Status: DC

## 2013-09-14 MED ORDER — ONDANSETRON 8 MG/50ML IVPB (CHCC)
8.0000 mg | Freq: Once | INTRAVENOUS | Status: AC
  Administered 2013-09-14: 8 mg via INTRAVENOUS

## 2013-09-14 MED ORDER — OXYCODONE HCL 5 MG PO TABS: 5.0000 mg | ORAL_TABLET | ORAL | Status: DC | PRN

## 2013-09-14 NOTE — Patient Instructions (Addendum)
[facility] Discharge Instructions for Patients Receiving Chemotherapy  Today you received the following chemotherapy agents:  Gemzar and Carboplatin  To help prevent nausea and vomiting after your treatment, we encourage you to take your nausea medication as ordered per MD.   If you develop nausea and vomiting that is not controlled by your nausea medication, call the clinic.   BELOW ARE SYMPTOMS THAT SHOULD BE REPORTED IMMEDIATELY:  *FEVER GREATER THAN 100.5 F  *CHILLS WITH OR WITHOUT FEVER  NAUSEA AND VOMITING THAT IS NOT CONTROLLED WITH YOUR NAUSEA MEDICATION  *UNUSUAL SHORTNESS OF BREATH  *UNUSUAL BRUISING OR BLEEDING  TENDERNESS IN MOUTH AND THROAT WITH OR WITHOUT PRESENCE OF ULCERS  *URINARY PROBLEMS  *BOWEL PROBLEMS  UNUSUAL RASH Items with * indicate a potential emergency and should be followed up as soon as possible.  Feel free to call the clinic you have any questions or concerns. The clinic phone number is [phone].    

## 2013-09-14 NOTE — Progress Notes (Addendum)
OFFICE PROGRESS NOTE  CC**  Erika Flake, MD 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    PRIOR THERAPY: 1. 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 2 months ago this began getting larger rapidly. She was seen in an urgent care where this mass was thought to be an abscess. Apparently there was an attempt to excise or drain this that was not successful and she was referred to Dr. Dwain Sarna for evaluation. She underwent punch biopsy on 04/23/13 of her skin and the mass. She was then also sent for mm/us. The biopsy shows invasive high grade carcinoma that appears to be breast. It is ER 14%, PR negative, HER-2/neu negative with a Ki-67 that approaches 100%. She has also undergone mm with at least 10 cm upper inner left breast tumor with mild overyling skin thickening. There is also a 3x10 mm cluster of calcs near there also. There is indeterminate 7 mm level 1 left axillary node, she underwent a lymph node biopsy that was positive for disease. No mm evidence of right breast malignancy.  An MRI of the breasts was done on 05/22/13 and showed a 14 cm mass in the left breast protruding from the skin and also a 2.5 cm level II axillary node.   2.  Patient evaluated and began on neoadjuvant chemotherapy.  Adriamycin and Cytoxan began on 05/25/13.  She completed 4 cycles with Neulasta support.   Taxol carbo began on 8/8. 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  #3 staging PET/CT performed on 06/13/2013 revealed no evidence of distant disease.  #4 anemia of iron deficiency  CURRENT THERAPY: gemzar/Carbo cycle 4 day 1  INTERVAL HISTORY: Erika [name] 42 y.o. female returns for evaluation prior receiving her fourth cycle of Gemzar/Carbo.  She's doing well today.  Her muscle spasms are less  severe since receiving Flexeril.  The abrasion under her left breast is healing well. Her neuropathy is stable.  Otherwise, she denies fevers, chills, nausea, vomiting, constipation, diarrhea, or further concerns.    MEDICAL HISTORY: Past Medical History  Diagnosis Date  . Breast cancer     Invasive High Grade Carcinoma  . Headache(784.0)     Hx: of Migraines  . Status post chemotherapy     Adriamycin and Cytoxan    ALLERGIES:  has No Known Allergies.  MEDICATIONS:  Current Outpatient Prescriptions  Medication Sig Dispense Refill  . cyclobenzaprine (FLEXERIL) 5 MG tablet Take 1 tablet (5 mg total) by mouth 3 (three) times daily as needed for muscle spasms.  30 tablet  0  . dexamethasone (DECADRON) 4 MG tablet Take 2 tablets (8 mg total) by mouth See admin instructions. Take daily for 3 days starting day after chemo  30 tablet  0  . enoxaparin (LOVENOX) 150 MG/ML injection Inject 1 mL (150 mg total) into the skin daily.  30 Syringe  6  . fentaNYL (DURAGESIC - DOSED MCG/HR) 75 MCG/HR Place 1 patch (75 mcg total) onto the skin every 3 (three) days.  10 patch  0  . ibuprofen (ADVIL,MOTRIN) 200 MG tablet Take 600-800 mg by mouth every 4 (four) hours as needed for pain.       Marland Kitchen lidocaine-prilocaine (EMLA) cream Apply 1 application topically every 14 (fourteen) days. Every other Friday prior to chemo      .  LORazepam (ATIVAN) 0.5 MG tablet Take 1 tablet (0.5 mg total) by mouth every 6 (six) hours as needed for anxiety.  30 tablet  0  . oxyCODONE (OXY IR/ROXICODONE) 5 MG immediate release tablet Take 1 tablet (5 mg total) by mouth every 4 (four) hours as needed for pain.  60 tablet  0  . potassium chloride SA (K-DUR,KLOR-CON) 20 MEQ tablet Take 1 tablet (20 mEq total) by mouth 3 (three) times daily.  90 tablet  0  . zolpidem (AMBIEN) 5 MG tablet Take 1 tablet (5 mg total) by mouth at bedtime as needed for sleep.  30 tablet  0  . ciprofloxacin (CIPRO) 500 MG tablet Take 1 tablet (500 mg total) by  mouth 2 (two) times daily.  14 tablet  0   No current facility-administered medications for this visit.   Facility-Administered Medications Ordered in Other Visits  Medication Dose Route Frequency Provider Last Rate Last Dose  . goserelin (ZOLADEX) injection 3.6 mg  3.6 mg Subcutaneous Q28 days Victorino December, MD   3.6 mg at 09/07/13 1241    SURGICAL HISTORY:  Past Surgical History  Procedure Laterality Date  . Tubal ligation    . Portacath placement Right 05/22/2013    Procedure: INSERTION PORT-A-CATH;  Surgeon: Emelia Loron, MD;  Location: [facility] OR;  Service: General;  Laterality: Right;  . Left breast needle core biopsy Left 05/21/13  . Left breast needle core  biopsy Left 06/22/13    REVIEW OF SYSTEMS:  A 10 point review of systems was conducted and is otherwise negative except for what is noted above.    PHYSICAL EXAMINATION: Blood pressure 112/70, pulse 116, temperature 98.5 F (36.9 C), temperature source Oral, resp. rate 20, height 5\' 5"  (1.651 m), weight 236 lb 1.6 oz (107.094 kg). Body mass index is 39.29 kg/(m^2). General: Patient is a well appearing female in no acute distress HEENT: PERRLA, sclerae anicteric no conjunctival pallor, MMM Neck: supple, no palpable adenopathy Lungs: clear to auscultation bilaterally, no wheezes, rhonchi, or rales Cardiovascular: regular rate rhythm, S1, S2, no murmurs, rubs or gallops Abdomen: Soft, non-tender, non-distended, normoactive bowel sounds, no HSM Extremities: warm and well perfused, no clubbing, cyanosis, or edema Skin: No rashes or lesions Neuro: Non-focal Breasts: left breast mass is much smaller, now hardly palpable, skin change from where mass ulcerated is evident with mild thickness, however lesion is much improved.  Under the left breast there is a linear lesion about 3 cm it is covered by a dressing at todays visit and the dressing is clean dry and intact.  She changed it yesterday.   ECOG PERFORMANCE STATUS: 1 -  Symptomatic but completely ambulatory  LABORATORY DATA: Lab Results  Component Value Date   WBC 8.0 09/14/2013   HGB 10.5* 09/14/2013   HCT 32.9* 09/14/2013   MCV 93.2 09/14/2013   PLT 306 09/14/2013      Chemistry      Component Value Date/Time   NA 141 09/14/2013 1034   NA 139 05/18/2013 1205   K 3.8 09/14/2013 1034   K 3.8 05/18/2013 1205   CL 102 06/01/2013 1500   CL 103 05/18/2013 1205   CO2 22 09/14/2013 1034   CO2 28 05/18/2013 1205   BUN 4.1* 09/14/2013 1034   BUN 8 05/18/2013 1205   CREATININE 0.8 09/14/2013 1034   CREATININE 0.84 05/18/2013 1205      Component Value Date/Time   CALCIUM 9.5 09/14/2013 1034   CALCIUM 9.1 05/18/2013 1205  ALKPHOS 155* 09/14/2013 1034   ALKPHOS 111 05/18/2013 1205   AST 62* 09/14/2013 1034   AST 18 05/18/2013 1205   ALT 98* 09/14/2013 1034   ALT 16 05/18/2013 1205   BILITOT <0.20 09/14/2013 1034   BILITOT 0.2* 05/18/2013 1205       RADIOGRAPHIC STUDIES:  Chest 2 View  05/18/2013   *RADIOLOGY REPORT*  Clinical Data: Breast carcinoma  CHEST - 2 VIEW  Comparison: None.  Findings: Probable left breast mass. Lungs clear.  Heart size and pulmonary vascularity normal.  No effusion.  Visualized bones unremarkable.  IMPRESSION: No acute disease   Original Report Authenticated By: D. Andria Rhein, MD   US Breast Left  05/08/2013   *RADIOLOGY REPORT*  Clinical Data:  42 year old female with large left breast mass - recent punch biopsy demonstrating high-grade carcinoma.  DIGITAL DIAGNOSTIC BILATERAL MAMMOGRAM WITH CAD AND LEFT BREAST ULTRASOUND:  Comparison:  None.  Findings:  ACR Breast Density Category 2: There is a scattered fibroglandular pattern.  A large irregular mass within the upper inner posterior left breast is identified - measuring at least 10 cm. Mild overlying skin thickening is identified compatible with dermal invasion. A 3 x 10 mm cluster of slightly heterogeneous left breast calcifications approximate 4 cm anterior and lateral to this mass is identified.   There is no evidence of suspicious mass, distortion or worrisome calcifications within the right breast. Mammographic images were processed with CAD.  On physical exam, a very large protuberant mass within the upper inner left breast is identified.  Ultrasound is performed, showing a large heterogeneous mass within the upper inner left breast at the 10-11 o'clock position, measuring at least 6.5 cm, but evaluation is limited by transducer skin area and protuberant portion of the mass. A 7 mm level I left axillary lymph node is identified with slightly echogenic rim - suspicious.  IMPRESSION: Large biopsy-proven carcinoma within the upper inner left breast, measuring at least 10 cm mammographically. Mild overlying skin thickening compatible with dermal invasion.  3 x 10 mm cluster of indeterminate left breast calcifications 4 cm anterior and lateral to the biopsy-proven neoplasm.  This may represent DCIS and consider tissue sampling.  Indeterminate 7 mm level I left axillary lymph node - metastatic disease not excluded.  No mammographic evidence of right breast malignancy.  BI-RADS CATEGORY 6:  Known biopsy-proven malignancy - appropriate action should be taken.  RECOMMENDATION: Treatment plan.  Consider biopsies of the indeterminate left axillary lymph node and left breast calcifications as clinically indicated.  Consider bilateral breast MRI as clinically indicated.  I have discussed the findings and recommendations with the patient. Results were also provided in writing at the conclusion of the visit.   Original Report Authenticated By: Harmon Pier, M.D.   Mr Breast Bilateral W Wo Contrast  05/22/2013   *RADIOLOGY REPORT*  Clinical Data: large invasive carcinoma left upper inner quadrant with dermal involvement  BILATERAL BREAST MRI WITH AND WITHOUT CONTRAST  Technique: Multiplanar, multisequence MR images of both breasts were obtained prior to and following the intravenous administration of 20ml of Multihance.   Three dimensional images were evaluated at the independent DynaCad workstation.  Comparison:  05/08/13  Findings: There is moderate background parenchymal enhancement. The right breast and axilla are negative.  On the left, there is a large irregular mass with irregular borders in the upper inner quadrant, demonstrating heterogenous internal enhancement.  The mass measures 14 (AP x 7.5 (lateral) x 8 (craniocaudal)cm.  It causes protrusion of  the skin of the breast in the 10 - 11 o'clock position, and it is associated with edematous change in the skin of the left breast, primarily in the upper inner quadrant but also in the periareolar in more infra- areolar left breast.  The mass extends through pectoralis major musculature posteriorly, with enhancing mass extending all the way to the posterior edge of the muscle.  In the 2 o'clock position posteriorly there is a 9mm oval enhancing mass with a mixture of plateau and washout kinetics; it is an indeterminate finding but shows the suggestion of a fatty hilum, and it may be an intramammary lymph node.  In the left axilla, there is an enlarged level II lymph node demonstrating hypervascularity and cortical thickening.  It measures 2.5cm in greatest dimension, and the appearance is concerning for metastasis.  There are no other abnormalities on the left.  IMPRESSION: Large left breast malignancy associated with involvement of the dermis and the chest wall, as well as with probable axillary lymph node metastasis.  There is an indeterminate 9mm mass in the 2 o'clock position, that may be an intramammary lymph node.  BI-RADS CATEGORY 6:  Known biopsy-proven malignancy - appropriate action should be taken.  THREE-DIMENSIONAL MR IMAGE RENDERING ON INDEPENDENT WORKSTATION:  Three-dimensional MR images were rendered by post-processing of the original MR data on an independent workstation.  The three- dimensional MR images were interpreted, and findings were reported in the  accompanying complete MRI report for this study.   Original Report Authenticated By: Esperanza Heir, M.D.   Dg Chest Port 1 View  05/22/2013   *RADIOLOGY REPORT*  Clinical Data: Port-A-Cath placement.  PORTABLE CHEST - 1 VIEW  Comparison: 05/18/2013  Findings: Right-sided power injectable Port-A-Cath noted with distal tip projecting over the SVC.  Low lung volumes are present, causing crowding of the pulmonary vasculature.  No pneumothorax noted.  Bilateral midlung subsegmental atelectasis noted.  Borderline cardiomegaly.  IMPRESSION:  1.  Power injectable catheter tip:  SVC.  No pneumothorax.   Original Report Authenticated By: Gaylyn Rong, M.D.   Mm Digital Diagnostic Bilat  05/08/2013   *RADIOLOGY REPORT*  Clinical Data:  42 year old female with large left breast mass - recent punch biopsy demonstrating high-grade carcinoma.  DIGITAL DIAGNOSTIC BILATERAL MAMMOGRAM WITH CAD AND LEFT BREAST ULTRASOUND:  Comparison:  None.  Findings:  ACR Breast Density Category 2: There is a scattered fibroglandular pattern.  A large irregular mass within the upper inner posterior left breast is identified - measuring at least 10 cm. Mild overlying skin thickening is identified compatible with dermal invasion. A 3 x 10 mm cluster of slightly heterogeneous left breast calcifications approximate 4 cm anterior and lateral to this mass is identified.  There is no evidence of suspicious mass, distortion or worrisome calcifications within the right breast. Mammographic images were processed with CAD.  On physical exam, a very large protuberant mass within the upper inner left breast is identified.  Ultrasound is performed, showing a large heterogeneous mass within the upper inner left breast at the 10-11 o'clock position, measuring at least 6.5 cm, but evaluation is limited by transducer skin area and protuberant portion of the mass. A 7 mm level I left axillary lymph node is identified with slightly echogenic rim -  suspicious.  IMPRESSION: Large biopsy-proven carcinoma within the upper inner left breast, measuring at least 10 cm mammographically. Mild overlying skin thickening compatible with dermal invasion.  3 x 10 mm cluster of indeterminate left breast calcifications 4 cm anterior  and lateral to the biopsy-proven neoplasm.  This may represent DCIS and consider tissue sampling.  Indeterminate 7 mm level I left axillary lymph node - metastatic disease not excluded.  No mammographic evidence of right breast malignancy.  BI-RADS CATEGORY 6:  Known biopsy-proven malignancy - appropriate action should be taken.  RECOMMENDATION: Treatment plan.  Consider biopsies of the indeterminate left axillary lymph node and left breast calcifications as clinically indicated.  Consider bilateral breast MRI as clinically indicated.  I have discussed the findings and recommendations with the patient. Results were also provided in writing at the conclusion of the visit.   Original Report Authenticated By: Harmon Pier, M.D.   Dg Fluoro Guide Cv Line-no Report  05/22/2013   CLINICAL DATA: surgery   FLOURO GUIDE CV LINE  Fluoroscopy was utilized by the requesting physician.  No radiographic  interpretation.    [place] Lt Breast Bx W Loc Dev 1st Lesion Img Bx Spec US Guide  05/22/2013   **ADDENDUM** CREATED: 05/22/2013 11:12:58  The patient was called by myself, Dr. Micheline Maze today.  The patient states she is doing well since her left axillary node biopsy without problems at the biopsy site.  The patient was given the results of her biopsy which was compatible with metastatic invasive mammary carcinoma.  This is concordant with imaging findings and patient's known history of recent positive biopsy of a large inner upper left breast mass demonstrating invasive mammary carcinoma. The patient had her breast MRI this morning and will receive Port-A- Cath placement by Dr. Dwain Sarna this afternoon.  Recommend follow- up pending MRI results and per treatment  plan.  **END ADDENDUM** SIGNED BY: Melton Alar. Micheline Maze, M.D.  05/21/2013   *RADIOLOGY REPORT*  Clinical Data:  New diagnosis of invasive mammary carcinoma in the left upper inner quadrant.  Abnormal lymph node noted on ultrasound dated 05/08/2013.  ULTRASOUND GUIDED CORE BIOPSY OF THE LEFT AXILLA  The patient and I discussed the procedure of ultrasound-guided biopsy, including benefits and alternatives.  We discussed the high likelihood of a successful procedure. We discussed the risks of the procedure, including infection, bleeding, tissue injury, clip migration, and inadequate sampling.  Written informed consent was given. Appropriate time-out was performed.  Using sterile technique, 2% lidocaine, ultrasound guidance, and a 14 gauge automated biopsy device, biopsy was performed of the abnormal left axillary lymph node using a lateromedial approach. At the conclusion of the procedure, a dumbbell shaped tissue marker clip was deployed into the biopsy cavity. Post biopsy mammogram was not performed.  IMPRESSION: Ultrasound guided biopsy of an abnormal left axillary lymph node. No apparent complications.  Original Report Authenticated By: Cain Saupe, M.D.    ASSESSMENT:   1. Patient is a 41 year old female with high grade invasive breast carcinoma ER 14%, PR negative, HER-2/neu negative.  She was evaluated by Dr. Dwain Sarna.      #2 patient had CT and PET scan performed. She has no evidence of metastatic disease distantly.  #3 she was begun on neoadjuvant chemotherapy consisting of Adriamycin Cytoxan given Q2 weekly beginning on 06/08/2013. She completed this regimen, and is now receiving Taxol/Carbo weekly. She received 1 cycle of this and it was discontinued due to progressive neuropathy.  She started Gemzar/Carbo on 8/22. She will receive 6 cycles of this.    #4 anemia due to menorrhagia. She is now status post feraheme given on 06/13/2013.  #5 Pain controlled on Percocet and Fentanyl Patch. We did  receive a letter from her insurance company  due that reported she has had three providers prescribing her narcotics since April.  This was discussed with Mrs. Buchan and she will only receive pain medication from myself or Dr. Welton Flakes.   PLAN:  1. Patient is doing well.  I reviewed her labs with her in detail.  She will proceed with chemotherapy.  I ordered an MRI to evaluate her response to neoadjuvant therapy for 10/01/13, and f/u with Dr. Dwain Sarna afterward.  She has a genetic eval with Maylon Cos on 10/7 at 9 am.    2.She will continue with potassium supplementation.  Her potassium has been stable.    3. She will continue on Lovenox therapy indefinitely.    4. Patient's pain is improved with fentanyl patches and as needed Oxycodone.      5. She will return tomorrow for Neulasta and in one week for labs and evaluation of chemotoxicities.    All questions were answered. The patient knows to call the clinic with any problems, questions or concerns. We can certainly see the patient much sooner if necessary.  I spent 25 minutes counseling the patient face to face. The total time spent in the appointment was 30 minutes.  Cherie Ouch Lyn Hollingshead, NP Medical Oncology [facility] Phone: [phone] 09/15/2013, 11:34 AM   ATTENDING'S ATTESTATION:  I personally reviewed patient's chart, examined patient myself, formulated the treatment plan as followed.    Patient continues to do well.she will proceed with her chemotherapy. She will have MRI of the breasts performed towards the middle of October for reevaluation of response to therapy.  Drue Second, MD Medical/Oncology [facility] [phone] (beeper) [phone] (Office)

## 2013-09-14 NOTE — Patient Instructions (Signed)
Doing well.  Proceed with chemotherapy.  You will have a genetics appt on Tuesday 09/18/13 at 9am with karen powell.  Please call us if you have any questions or concerns.

## 2013-09-14 NOTE — Telephone Encounter (Signed)
Per staff message and POF I have scheduled appts.  JMW  

## 2013-09-15 ENCOUNTER — Ambulatory Visit (HOSPITAL_BASED_OUTPATIENT_CLINIC_OR_DEPARTMENT_OTHER): Payer: No Typology Code available for payment source

## 2013-09-15 VITALS — BP 118/77 | HR 109 | Temp 99.0°F

## 2013-09-15 DIAGNOSIS — C773 Secondary and unspecified malignant neoplasm of axilla and upper limb lymph nodes: Secondary | ICD-10-CM

## 2013-09-15 DIAGNOSIS — C50219 Malignant neoplasm of upper-inner quadrant of unspecified female breast: Secondary | ICD-10-CM

## 2013-09-15 DIAGNOSIS — Z5189 Encounter for other specified aftercare: Secondary | ICD-10-CM

## 2013-09-15 DIAGNOSIS — C50212 Malignant neoplasm of upper-inner quadrant of left female breast: Secondary | ICD-10-CM

## 2013-09-15 MED ORDER — PEGFILGRASTIM INJECTION 6 MG/0.6ML
6.0000 mg | Freq: Once | SUBCUTANEOUS | Status: AC
  Administered 2013-09-15: 6 mg via SUBCUTANEOUS

## 2013-09-15 NOTE — Patient Instructions (Addendum)

## 2013-09-18 ENCOUNTER — Other Ambulatory Visit: Payer: Medicaid Other | Admitting: Lab

## 2013-09-18 ENCOUNTER — Encounter: Payer: Self-pay | Admitting: Genetic Counselor

## 2013-09-18 ENCOUNTER — Ambulatory Visit (HOSPITAL_BASED_OUTPATIENT_CLINIC_OR_DEPARTMENT_OTHER): Payer: No Typology Code available for payment source | Admitting: Genetic Counselor

## 2013-09-18 DIAGNOSIS — Z800 Family history of malignant neoplasm of digestive organs: Secondary | ICD-10-CM

## 2013-09-18 DIAGNOSIS — IMO0002 Reserved for concepts with insufficient information to code with codable children: Secondary | ICD-10-CM

## 2013-09-18 DIAGNOSIS — C50219 Malignant neoplasm of upper-inner quadrant of unspecified female breast: Secondary | ICD-10-CM

## 2013-09-18 DIAGNOSIS — C50212 Malignant neoplasm of upper-inner quadrant of left female breast: Secondary | ICD-10-CM

## 2013-09-18 NOTE — Progress Notes (Signed)
Dr. Mariam Dollar requested a consultation for genetic counseling and risk assessment for Erika [name], a 42 y.o. female, for discussion of her personal history of breast cancer and family history of stomach cancer.  Erika [name] presents to clinic today to discuss the possibility of a genetic predisposition to cancer, and to further clarify her risks, as well as her family members' risks for cancer.   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 This is being treated with chemotherapy.  Erika [name] is scheduled for surgery in about 6 weeks, to be determined by her genetic testing, and then radiation and tamoxifen for 10 years.  Her tumor is ER+/PR-/Her2-. Erika [name] has never had radiation therapy to her head, chest or neck region.  Recently, Erika [name] has been diagnosed with diabetes.     Past Medical History  Diagnosis Date  . Breast cancer     Invasive High Grade Carcinoma  . Headache(784.0)     Hx: of Migraines  . Status post chemotherapy     Adriamycin and Cytoxan    Past Surgical History  Procedure Laterality Date  . Tubal ligation    . Portacath placement Right 05/22/2013    Procedure: INSERTION PORT-A-CATH;  Surgeon: Emelia Loron, MD;  Location: [facility] OR;  Service: General;  Laterality: Right;  . Left breast needle core biopsy Left 05/21/13  . Left breast needle core  biopsy Left 06/22/13    History   Social History  . Marital Status: Single    Spouse Name: N/A    Number of Children: 3  . Years of Education: N/A   Occupational History  .     Social History Main Topics  . Smoking status: Never Smoker   . Smokeless tobacco: Never Used  . Alcohol Use: No  . Drug Use: No  . Sexual Activity: Yes   Other Topics Concern  . None   Social History Narrative  . None    REPRODUCTIVE HISTORY AND PERSONAL RISK ASSESSMENT FACTORS: Menarche was at age 73.   perimenopausal Uterus Intact: yes Ovaries Intact:  yes G3P3A0, first live birth at age 65  Erika [name] has not previously undergone treatment for infertility.   Oral Contraceptive use: 10 years   Erika [name] has not used HRT in the past.    FAMILY HISTORY:  We obtained a detailed, 4-generation family history.  Significant diagnoses are listed below: Family History  Problem Relation Age of Onset  . Hypertension Mother   . Diabetes Father 32  . Stomach cancer Maternal Aunt   . Hypertension Maternal Grandmother   . Hypertension Maternal Grandfather   . Diabetes Paternal Grandmother   . Diabetes Paternal Grandfather   . Diabetes Paternal Uncle   . Diabetes Paternal Uncle   . Diabetes Paternal Uncle     Patient's maternal ancestors are of [place] descent, and paternal ancestors are of [place] descent. There is no reported Ashkenazi Jewish ancestry. There is no known consanguinity.  GENETIC COUNSELING ASSESSMENT: Erika [name] is a 42 y.o. female with a personal history of breast cancer and family history of stomach cancer which somewhat suggestive of a hereditary cancer syndrome and predisposition to cancer. We, therefore, discussed and recommended the following at today's visit.   DISCUSSION: We reviewed the characteristics, features and inheritance patterns of hereditary cancer syndromes. We also discussed genetic testing, including the appropriate family members to test, the process of  testing, insurance coverage and turn-around-time for results. We reviewed the increased risk for BRCA mutations based on her age, but also the potential concern for CDH1 mutations based on her early onset of breast cancer and the family history of stomach cancer. Based on these two concerns, we recommend the Breast/Ovarian cancer panel through [facility].  PLAN: After considering the risks, benefits, and limitations, Erika [name] provided informed consent to pursue genetic testing and the blood sample will be sent to  [facility] for analysis of the Breast/Ovarian Cancer panel. We discussed the implications of a positive, negative and/ or variant of uncertain significance genetic test result. Results should be available within approximately 3 weeks' time, at which point they will be disclosed by telephone to Erika [name], as will any additional recommendations warranted by these results. Erika [name] will receive a summary of her genetic counseling visit and a copy of her results once available. This information will also be available in Epic. We encouraged Erika [name] to remain in contact with cancer genetics annually so that we can continuously update the family history and inform her of any changes in cancer genetics and testing that may be of benefit for her family. Erika [name] questions were answered to her satisfaction today. Our contact information was provided should additional questions or concerns arise.  The patient was seen for a total of 45 minutes, greater than 50% of which was spent face-to-face counseling.  This note will also be sent to the referring provider via the electronic medical record. The patient will be supplied with a summary of this genetic counseling discussion as well as educational information on the discussed hereditary cancer syndromes following the conclusion of their visit.   Patient was discussed with Dr. Drue Second.   _______________________________________________________________________ For Office Staff:  Number of people involved in session: 2 Was an Intern/ student involved with case: no

## 2013-09-21 ENCOUNTER — Other Ambulatory Visit (HOSPITAL_BASED_OUTPATIENT_CLINIC_OR_DEPARTMENT_OTHER): Payer: No Typology Code available for payment source | Admitting: Lab

## 2013-09-21 ENCOUNTER — Ambulatory Visit (HOSPITAL_BASED_OUTPATIENT_CLINIC_OR_DEPARTMENT_OTHER): Payer: No Typology Code available for payment source | Admitting: Nurse Practitioner

## 2013-09-21 VITALS — BP 115/81 | HR 108 | Temp 98.0°F | Resp 18 | Ht 65.0 in | Wt 236.8 lb

## 2013-09-21 DIAGNOSIS — I82409 Acute embolism and thrombosis of unspecified deep veins of unspecified lower extremity: Secondary | ICD-10-CM

## 2013-09-21 DIAGNOSIS — C50212 Malignant neoplasm of upper-inner quadrant of left female breast: Secondary | ICD-10-CM

## 2013-09-21 DIAGNOSIS — C50219 Malignant neoplasm of upper-inner quadrant of unspecified female breast: Secondary | ICD-10-CM

## 2013-09-21 LAB — COMPREHENSIVE METABOLIC PANEL (CC13)
ALT: 89 U/L — ABNORMAL HIGH (ref 0–55)
AST: 41 U/L — ABNORMAL HIGH (ref 5–34)
Alkaline Phosphatase: 202 U/L — ABNORMAL HIGH (ref 40–150)
CO2: 26 mEq/L (ref 22–29)
Calcium: 9.7 mg/dL (ref 8.4–10.4)
Chloride: 106 mEq/L (ref 98–109)
Potassium: 3.8 mEq/L (ref 3.5–5.1)
Sodium: 141 mEq/L (ref 136–145)
Total Bilirubin: <0.2 mg/dL (ref 0.20–1.20)
Total Protein: 7 g/dL (ref 6.4–8.3)

## 2013-09-21 LAB — CBC WITH DIFFERENTIAL/PLATELET
BASO%: 0.4 % (ref 0.0–2.0)
LYMPH%: 6.1 % — ABNORMAL LOW (ref 14.0–49.7)
MCHC: 33.4 g/dL (ref 31.5–36.0)
MCV: 93.5 fL (ref 79.5–101.0)
MONO%: 1.6 % (ref 0.0–14.0)
Platelets: 277 10*3/uL (ref 145–400)
RBC: 2.99 10*6/uL — ABNORMAL LOW (ref 3.70–5.45)
RDW: 21.6 % — ABNORMAL HIGH (ref 11.2–14.5)
WBC: 26.2 10*3/uL — ABNORMAL HIGH (ref 3.9–10.3)

## 2013-09-21 NOTE — Progress Notes (Signed)
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 She is completing neoadjuvant chemotherapy. She completed 4 cycles of Adriamycin/Cytoxan 05/25/2013 through 07/06/2013. 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 She began carboplatin/gemcitabine 08/03/2013. She completed cycle 4 on 09/14/2013.  She is seen today for scheduled followup.  She feels well. She has a good appetite but does note an alteration in taste. She had mild nausea following the most recent chemotherapy. She thinks she may have vomited 1 time. She took her antinausea medications with good relief. She denies mouth sores. No diarrhea. No skin rash. She denies pain at present. No fever, cough or shortness of breath. No leg swelling or calf pain. Previous right arm swelling has resolved. She denies bleeding. She continues Lovenox.   Objective: Blood pressure 115/81, pulse 108, temperature 98 F (36.7 C), temperature source Oral, resp. rate 18, height 5\' 5"  (1.651 m), weight 236 lb 12.8 oz (107.412 kg), SpO2 100.00%.  Oropharynx is without thrush or ulceration. Lungs are clear. No wheezes or rales. Regular cardiac rhythm. Port-A-Cath site is without erythema. Abdomen is soft and nontender. No organomegaly. Extremities are without edema. Motor strength 5 over 5. Knee DTRs 1+, symmetric. No skin rash. Skin hyperpigmentation noted dorsal aspect of both hands. Vibratory sense intact over the fingertips per tuning fork exam.  Lab Results: Lab Results  Component Value Date   WBC 26.2* 09/21/2013   HGB 9.3* 09/21/2013   HCT 28.0* 09/21/2013   MCV 93.5 09/21/2013   PLT 277 09/21/2013    Chemistry:    Chemistry      Component Value Date/Time   NA 141 09/14/2013 1034   NA 139 05/18/2013 1205   K 3.8 09/14/2013 1034   K 3.8 05/18/2013 1205   CL 102 06/01/2013 1500   CL 103 05/18/2013 1205   CO2 22  09/14/2013 1034   CO2 28 05/18/2013 1205   BUN 4.1* 09/14/2013 1034   BUN 8 05/18/2013 1205   CREATININE 0.8 09/14/2013 1034   CREATININE 0.84 05/18/2013 1205      Component Value Date/Time   CALCIUM 9.5 09/14/2013 1034   CALCIUM 9.1 05/18/2013 1205   ALKPHOS 155* 09/14/2013 1034   ALKPHOS 111 05/18/2013 1205   AST 62* 09/14/2013 1034   AST 18 05/18/2013 1205   ALT 98* 09/14/2013 1034   ALT 16 05/18/2013 1205   BILITOT <0.20 09/14/2013 1034   BILITOT 0.2* 05/18/2013 1205       Studies/Results: No results found.  Medications: I have reviewed the patient's current medications.  Assessment/Plan:  1. High-grade invasive cancer of the left breast currently completing neoadjuvant chemotherapy.  She completed 4 cycles of Adriamycin/Cytoxan 05/25/2013 through 07/06/2013. 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 She began carboplatin/gemcitabine 08/03/2013. She completed cycle 4 on 09/14/2013. 2. Right upper extremity DVT 06/29/2013. She is maintained on therapeutic Lovenox.  Disposition-Erika [name] appears stable. She seems to be tolerating the chemotherapy well. She is scheduled to return for labs, office visit and the next cycle of carboplatin/gemcitabine on 09/28/2013. She is scheduled for followup breast MRI on 10/01/2013 and has a visit with Dr. Dwain Sarna on 10/09/2013.  She will contact the office prior to her next visit with any problems.   Lonna Cobb ANP/GNP-BC

## 2013-09-28 ENCOUNTER — Other Ambulatory Visit: Payer: No Typology Code available for payment source | Admitting: Lab

## 2013-09-28 ENCOUNTER — Encounter: Payer: Self-pay | Admitting: Adult Health

## 2013-09-28 ENCOUNTER — Encounter: Payer: Self-pay | Admitting: Oncology

## 2013-09-28 ENCOUNTER — Ambulatory Visit (HOSPITAL_BASED_OUTPATIENT_CLINIC_OR_DEPARTMENT_OTHER): Payer: No Typology Code available for payment source | Admitting: Adult Health

## 2013-09-28 ENCOUNTER — Other Ambulatory Visit (HOSPITAL_BASED_OUTPATIENT_CLINIC_OR_DEPARTMENT_OTHER): Payer: No Typology Code available for payment source | Admitting: Lab

## 2013-09-28 ENCOUNTER — Ambulatory Visit (HOSPITAL_BASED_OUTPATIENT_CLINIC_OR_DEPARTMENT_OTHER): Payer: No Typology Code available for payment source

## 2013-09-28 VITALS — BP 116/87 | HR 116 | Temp 98.2°F | Resp 18 | Ht 65.0 in | Wt 238.6 lb

## 2013-09-28 DIAGNOSIS — C50219 Malignant neoplasm of upper-inner quadrant of unspecified female breast: Secondary | ICD-10-CM

## 2013-09-28 DIAGNOSIS — N920 Excessive and frequent menstruation with regular cycle: Secondary | ICD-10-CM

## 2013-09-28 DIAGNOSIS — C50212 Malignant neoplasm of upper-inner quadrant of left female breast: Secondary | ICD-10-CM

## 2013-09-28 DIAGNOSIS — D500 Iron deficiency anemia secondary to blood loss (chronic): Secondary | ICD-10-CM

## 2013-09-28 DIAGNOSIS — Z170 Estrogen receptor positive status [ER+]: Secondary | ICD-10-CM

## 2013-09-28 DIAGNOSIS — R52 Pain, unspecified: Secondary | ICD-10-CM

## 2013-09-28 DIAGNOSIS — C773 Secondary and unspecified malignant neoplasm of axilla and upper limb lymph nodes: Secondary | ICD-10-CM

## 2013-09-28 DIAGNOSIS — Z5111 Encounter for antineoplastic chemotherapy: Secondary | ICD-10-CM

## 2013-09-28 LAB — CBC WITH DIFFERENTIAL/PLATELET
BASO%: 0.4 % (ref 0.0–2.0)
EOS%: 0.7 % (ref 0.0–7.0)
Eosinophils Absolute: 0.1 10*3/uL (ref 0.0–0.5)
HCT: 32.8 % — ABNORMAL LOW (ref 34.8–46.6)
LYMPH%: 27.6 % (ref 14.0–49.7)
MCH: 31.3 pg (ref 25.1–34.0)
MCHC: 32.6 g/dL (ref 31.5–36.0)
MCV: 95.9 fL (ref 79.5–101.0)
MONO#: 0.8 10*3/uL (ref 0.1–0.9)
MONO%: 9.3 % (ref 0.0–14.0)
NEUT#: 5.6 10*3/uL (ref 1.5–6.5)
NEUT%: 62 % (ref 38.4–76.8)
Platelets: 276 10*3/uL (ref 145–400)
RDW: 19.2 % — ABNORMAL HIGH (ref 11.2–14.5)
nRBC: 0 % (ref 0–0)

## 2013-09-28 LAB — COMPREHENSIVE METABOLIC PANEL (CC13)
ALT: 55 U/L (ref 0–55)
AST: 28 U/L (ref 5–34)
Anion Gap: 10 mEq/L (ref 3–11)
BUN: 6 mg/dL — ABNORMAL LOW (ref 7.0–26.0)
Calcium: 9.4 mg/dL (ref 8.4–10.4)
Chloride: 104 mEq/L (ref 98–109)
Creatinine: 0.8 mg/dL (ref 0.6–1.1)
Sodium: 138 mEq/L (ref 136–145)
Total Bilirubin: <0.2 mg/dL (ref 0.20–1.20)

## 2013-09-28 MED ORDER — DEXAMETHASONE SODIUM PHOSPHATE 10 MG/ML IJ SOLN
10.0000 mg | Freq: Once | INTRAMUSCULAR | Status: AC
  Administered 2013-09-28: 10 mg via INTRAVENOUS

## 2013-09-28 MED ORDER — DEXAMETHASONE SODIUM PHOSPHATE 10 MG/ML IJ SOLN
INTRAMUSCULAR | Status: AC
  Filled 2013-09-28: qty 1

## 2013-09-28 MED ORDER — ONDANSETRON 8 MG/50ML IVPB (CHCC)
8.0000 mg | Freq: Once | INTRAVENOUS | Status: AC
  Administered 2013-09-28: 8 mg via INTRAVENOUS

## 2013-09-28 MED ORDER — OXYCODONE HCL 5 MG PO TABS: 5.0000 mg | ORAL_TABLET | ORAL | Status: DC | PRN

## 2013-09-28 MED ORDER — ONDANSETRON 8 MG/NS 50 ML IVPB
INTRAVENOUS | Status: AC
  Filled 2013-09-28: qty 8

## 2013-09-28 MED ORDER — SODIUM CHLORIDE 0.9 % IV SOLN
299.2000 mg | Freq: Once | INTRAVENOUS | Status: AC
  Administered 2013-09-28: 300 mg via INTRAVENOUS
  Filled 2013-09-28: qty 30

## 2013-09-28 MED ORDER — SODIUM CHLORIDE 0.9 % IV SOLN
800.0000 mg/m2 | Freq: Once | INTRAVENOUS | Status: AC
  Administered 2013-09-28: 1710 mg via INTRAVENOUS
  Filled 2013-09-28: qty 44.97

## 2013-09-28 MED ORDER — HEPARIN SOD (PORK) LOCK FLUSH 100 UNIT/ML IV SOLN
500.0000 [IU] | Freq: Once | INTRAVENOUS | Status: AC | PRN
  Administered 2013-09-28: 500 [IU]
  Filled 2013-09-28: qty 5

## 2013-09-28 MED ORDER — SODIUM CHLORIDE 0.9 % IV SOLN
Freq: Once | INTRAVENOUS | Status: AC
  Administered 2013-09-28: 13:00:00 via INTRAVENOUS

## 2013-09-28 MED ORDER — SODIUM CHLORIDE 0.9 % IJ SOLN
10.0000 mL | INTRAMUSCULAR | Status: DC | PRN
  Administered 2013-09-28: 10 mL
  Filled 2013-09-28: qty 10

## 2013-09-28 NOTE — Progress Notes (Signed)
OFFICE PROGRESS NOTE  CC**  Erika Flake, MD [address] 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    PRIOR THERAPY: 1. 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 2 months ago this began getting larger rapidly. She was seen in an urgent care where this mass was thought to be an abscess. Apparently there was an attempt to excise or drain this that was not successful and she was referred to Dr. Dwain Sarna for evaluation. She underwent punch biopsy on 04/23/13 of her skin and the mass. She was then also sent for mm/us. The biopsy shows invasive high grade carcinoma that appears to be breast. It is ER 14%, PR negative, HER-2/neu negative with a Ki-67 that approaches 100%. She has also undergone mm with at least 10 cm upper inner left breast tumor with mild overyling skin thickening. There is also a 3x10 mm cluster of calcs near there also. There is indeterminate 7 mm level 1 left axillary node, she underwent a lymph node biopsy that was positive for disease. No mm evidence of right breast malignancy.  An MRI of the breasts was done on 05/22/13 and showed a 14 cm mass in the left breast protruding from the skin and also a 2.5 cm level II axillary node.   2.  Patient evaluated and began on neoadjuvant chemotherapy.  Adriamycin and Cytoxan began on 05/25/13.  She completed 4 cycles with Neulasta support.   Taxol carbo began on 8/8. 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  #3 staging PET/CT performed on 06/13/2013 revealed no evidence of distant disease.  #4 anemia of iron deficiency  CURRENT THERAPY: gemzar/Carbo cycle 5 day 1  INTERVAL HISTORY: Erika [name] 42 y.o. female returns for evaluation prior receiving her fifth cycle of Gemzar/Carbo.  She's doing well today.  She has vomited once.  She  continues to have intermittent muscle spasms that her husband rubs.  The numbness is stable.  She denies motor changes.  She denies fever, chills, nausea, constipation, diarrhea, or further concerns.   MEDICAL HISTORY: Past Medical History  Diagnosis Date  . Breast cancer     Invasive High Grade Carcinoma  . Headache(784.0)     Hx: of Migraines  . Status post chemotherapy     Adriamycin and Cytoxan    ALLERGIES:  has No Known Allergies.  MEDICATIONS:  Current Outpatient Prescriptions  Medication Sig Dispense Refill  . cyclobenzaprine (FLEXERIL) 5 MG tablet Take 1 tablet (5 mg total) by mouth 3 (three) times daily as needed for muscle spasms.  30 tablet  0  . dexamethasone (DECADRON) 4 MG tablet Take 2 tablets (8 mg total) by mouth See admin instructions. Take daily for 3 days starting day after chemo  30 tablet  0  . enoxaparin (LOVENOX) 150 MG/ML injection Inject 1 mL (150 mg total) into the skin daily.  30 Syringe  6  . fentaNYL (DURAGESIC - DOSED MCG/HR) 75 MCG/HR Place 1 patch (75 mcg total) onto the skin every 3 (three) days.  10 patch  0  . ibuprofen (ADVIL,MOTRIN) 200 MG tablet Take 600-800 mg by mouth every 4 (four) hours as needed for pain.       Marland Kitchen lidocaine-prilocaine (EMLA) cream Apply 1 application topically every 14 (fourteen) days. Every other Friday prior to chemo      .  LORazepam (ATIVAN) 0.5 MG tablet Take 1 tablet (0.5 mg total) by mouth every 6 (six) hours as needed for anxiety.  30 tablet  0  . oxyCODONE (OXY IR/ROXICODONE) 5 MG immediate release tablet Take 1 tablet (5 mg total) by mouth every 4 (four) hours as needed for pain.  60 tablet  0  . potassium chloride SA (K-DUR,KLOR-CON) 20 MEQ tablet Take 20 mEq by mouth daily.      Marland Kitchen zolpidem (AMBIEN) 5 MG tablet Take 1 tablet (5 mg total) by mouth at bedtime as needed for sleep.  30 tablet  0   No current facility-administered medications for this visit.   Facility-Administered Medications Ordered in Other Visits   Medication Dose Route Frequency Provider Last Rate Last Dose  . goserelin (ZOLADEX) injection 3.6 mg  3.6 mg Subcutaneous Q28 days Victorino December, MD   3.6 mg at 09/07/13 1241    SURGICAL HISTORY:  Past Surgical History  Procedure Laterality Date  . Tubal ligation    . Portacath placement Right 05/22/2013    Procedure: INSERTION PORT-A-CATH;  Surgeon: Emelia Loron, MD;  Location: [facility] OR;  Service: General;  Laterality: Right;  . Left breast needle core biopsy Left 05/21/13  . Left breast needle core  biopsy Left 06/22/13    REVIEW OF SYSTEMS:  A 10 point review of systems was conducted and is otherwise negative except for what is noted above.    PHYSICAL EXAMINATION: Blood pressure 116/87, pulse 116, temperature 98.2 F (36.8 C), temperature source Oral, resp. rate 18, height 5\' 5"  (1.651 m), weight 238 lb 9.6 oz (108.228 kg), SpO2 98.00%. Body mass index is 39.71 kg/(m^2). General: Patient is a well appearing female in no acute distress HEENT: PERRLA, sclerae anicteric no conjunctival pallor, MMM Neck: supple, no palpable adenopathy Lungs: clear to auscultation bilaterally, no wheezes, rhonchi, or rales Cardiovascular: regular rate rhythm, S1, S2, no murmurs, rubs or gallops Abdomen: Soft, non-tender, non-distended, normoactive bowel sounds, no HSM Extremities: warm and well perfused, no clubbing, cyanosis, or edema Skin: No rashes or lesions Neuro: Non-focal Breasts: left breast mass is much smaller, now hardly palpable, skin change from where mass ulcerated is evident with mild thickness, however lesion is much improved.  Under the left breast there is a linear lesion about 3 cm it is covered by a dressing at todays visit and the dressing is clean dry and intact.  She changed it yesterday.   ECOG PERFORMANCE STATUS: 1 - Symptomatic but completely ambulatory  LABORATORY DATA: Lab Results  Component Value Date   WBC 9.0 09/28/2013   HGB 10.7* 09/28/2013   HCT 32.8* 09/28/2013    MCV 95.9 09/28/2013   PLT 276 09/28/2013      Chemistry      Component Value Date/Time   NA 138 09/28/2013 1123   NA 139 05/18/2013 1205   K 3.6 09/28/2013 1123   K 3.8 05/18/2013 1205   CL 102 06/01/2013 1500   CL 103 05/18/2013 1205   CO2 24 09/28/2013 1123   CO2 28 05/18/2013 1205   BUN 6.0* 09/28/2013 1123   BUN 8 05/18/2013 1205   CREATININE 0.8 09/28/2013 1123   CREATININE 0.84 05/18/2013 1205      Component Value Date/Time   CALCIUM 9.4 09/28/2013 1123   CALCIUM 9.1 05/18/2013 1205   ALKPHOS 138 09/28/2013 1123   ALKPHOS 111 05/18/2013 1205   AST 28 09/28/2013 1123   AST 18 05/18/2013 1205   ALT 55 09/28/2013 1123  ALT 16 05/18/2013 1205   BILITOT <0.20 09/28/2013 1123   BILITOT 0.2* 05/18/2013 1205       RADIOGRAPHIC STUDIES:  Chest 2 View  05/18/2013   *RADIOLOGY REPORT*  Clinical Data: Breast carcinoma  CHEST - 2 VIEW  Comparison: None.  Findings: Probable left breast mass. Lungs clear.  Heart size and pulmonary vascularity normal.  No effusion.  Visualized bones unremarkable.  IMPRESSION: No acute disease   Original Report Authenticated By: D. Andria Rhein, MD   US Breast Left  05/08/2013   *RADIOLOGY REPORT*  Clinical Data:  42 year old female with large left breast mass - recent punch biopsy demonstrating high-grade carcinoma.  DIGITAL DIAGNOSTIC BILATERAL MAMMOGRAM WITH CAD AND LEFT BREAST ULTRASOUND:  Comparison:  None.  Findings:  ACR Breast Density Category 2: There is a scattered fibroglandular pattern.  A large irregular mass within the upper inner posterior left breast is identified - measuring at least 10 cm. Mild overlying skin thickening is identified compatible with dermal invasion. A 3 x 10 mm cluster of slightly heterogeneous left breast calcifications approximate 4 cm anterior and lateral to this mass is identified.  There is no evidence of suspicious mass, distortion or worrisome calcifications within the right breast. Mammographic images were processed with CAD.  On  physical exam, a very large protuberant mass within the upper inner left breast is identified.  Ultrasound is performed, showing a large heterogeneous mass within the upper inner left breast at the 10-11 o'clock position, measuring at least 6.5 cm, but evaluation is limited by transducer skin area and protuberant portion of the mass. A 7 mm level I left axillary lymph node is identified with slightly echogenic rim - suspicious.  IMPRESSION: Large biopsy-proven carcinoma within the upper inner left breast, measuring at least 10 cm mammographically. Mild overlying skin thickening compatible with dermal invasion.  3 x 10 mm cluster of indeterminate left breast calcifications 4 cm anterior and lateral to the biopsy-proven neoplasm.  This may represent DCIS and consider tissue sampling.  Indeterminate 7 mm level I left axillary lymph node - metastatic disease not excluded.  No mammographic evidence of right breast malignancy.  BI-RADS CATEGORY 6:  Known biopsy-proven malignancy - appropriate action should be taken.  RECOMMENDATION: Treatment plan.  Consider biopsies of the indeterminate left axillary lymph node and left breast calcifications as clinically indicated.  Consider bilateral breast MRI as clinically indicated.  I have discussed the findings and recommendations with the patient. Results were also provided in writing at the conclusion of the visit.   Original Report Authenticated By: Harmon Pier, M.D.   Mr Breast Bilateral W Wo Contrast  05/22/2013   *RADIOLOGY REPORT*  Clinical Data: large invasive carcinoma left upper inner quadrant with dermal involvement  BILATERAL BREAST MRI WITH AND WITHOUT CONTRAST  Technique: Multiplanar, multisequence MR images of both breasts were obtained prior to and following the intravenous administration of 20ml of Multihance.  Three dimensional images were evaluated at the independent DynaCad workstation.  Comparison:  05/08/13  Findings: There is moderate background parenchymal  enhancement. The right breast and axilla are negative.  On the left, there is a large irregular mass with irregular borders in the upper inner quadrant, demonstrating heterogenous internal enhancement.  The mass measures 14 (AP x 7.5 (lateral) x 8 (craniocaudal)cm.  It causes protrusion of the skin of the breast in the 10 - 11 o'clock position, and it is associated with edematous change in the skin of the left breast, primarily in the upper  inner quadrant but also in the periareolar in more infra- areolar left breast.  The mass extends through pectoralis major musculature posteriorly, with enhancing mass extending all the way to the posterior edge of the muscle.  In the 2 o'clock position posteriorly there is a 9mm oval enhancing mass with a mixture of plateau and washout kinetics; it is an indeterminate finding but shows the suggestion of a fatty hilum, and it may be an intramammary lymph node.  In the left axilla, there is an enlarged level II lymph node demonstrating hypervascularity and cortical thickening.  It measures 2.5cm in greatest dimension, and the appearance is concerning for metastasis.  There are no other abnormalities on the left.  IMPRESSION: Large left breast malignancy associated with involvement of the dermis and the chest wall, as well as with probable axillary lymph node metastasis.  There is an indeterminate 9mm mass in the 2 o'clock position, that may be an intramammary lymph node.  BI-RADS CATEGORY 6:  Known biopsy-proven malignancy - appropriate action should be taken.  THREE-DIMENSIONAL MR IMAGE RENDERING ON INDEPENDENT WORKSTATION:  Three-dimensional MR images were rendered by post-processing of the original MR data on an independent workstation.  The three- dimensional MR images were interpreted, and findings were reported in the accompanying complete MRI report for this study.   Original Report Authenticated By: Esperanza Heir, M.D.   Dg Chest Port 1 View  05/22/2013   *RADIOLOGY  REPORT*  Clinical Data: Port-A-Cath placement.  PORTABLE CHEST - 1 VIEW  Comparison: 05/18/2013  Findings: Right-sided power injectable Port-A-Cath noted with distal tip projecting over the SVC.  Low lung volumes are present, causing crowding of the pulmonary vasculature.  No pneumothorax noted.  Bilateral midlung subsegmental atelectasis noted.  Borderline cardiomegaly.  IMPRESSION:  1.  Power injectable catheter tip:  SVC.  No pneumothorax.   Original Report Authenticated By: Gaylyn Rong, M.D.   Mm Digital Diagnostic Bilat  05/08/2013   *RADIOLOGY REPORT*  Clinical Data:  42 year old female with large left breast mass - recent punch biopsy demonstrating high-grade carcinoma.  DIGITAL DIAGNOSTIC BILATERAL MAMMOGRAM WITH CAD AND LEFT BREAST ULTRASOUND:  Comparison:  None.  Findings:  ACR Breast Density Category 2: There is a scattered fibroglandular pattern.  A large irregular mass within the upper inner posterior left breast is identified - measuring at least 10 cm. Mild overlying skin thickening is identified compatible with dermal invasion. A 3 x 10 mm cluster of slightly heterogeneous left breast calcifications approximate 4 cm anterior and lateral to this mass is identified.  There is no evidence of suspicious mass, distortion or worrisome calcifications within the right breast. Mammographic images were processed with CAD.  On physical exam, a very large protuberant mass within the upper inner left breast is identified.  Ultrasound is performed, showing a large heterogeneous mass within the upper inner left breast at the 10-11 o'clock position, measuring at least 6.5 cm, but evaluation is limited by transducer skin area and protuberant portion of the mass. A 7 mm level I left axillary lymph node is identified with slightly echogenic rim - suspicious.  IMPRESSION: Large biopsy-proven carcinoma within the upper inner left breast, measuring at least 10 cm mammographically. Mild overlying skin thickening  compatible with dermal invasion.  3 x 10 mm cluster of indeterminate left breast calcifications 4 cm anterior and lateral to the biopsy-proven neoplasm.  This may represent DCIS and consider tissue sampling.  Indeterminate 7 mm level I left axillary lymph node - metastatic disease not excluded.  No mammographic evidence of right breast malignancy.  BI-RADS CATEGORY 6:  Known biopsy-proven malignancy - appropriate action should be taken.  RECOMMENDATION: Treatment plan.  Consider biopsies of the indeterminate left axillary lymph node and left breast calcifications as clinically indicated.  Consider bilateral breast MRI as clinically indicated.  I have discussed the findings and recommendations with the patient. Results were also provided in writing at the conclusion of the visit.   Original Report Authenticated By: Harmon Pier, M.D.   Dg Fluoro Guide Cv Line-no Report  05/22/2013   CLINICAL DATA: surgery   FLOURO GUIDE CV LINE  Fluoroscopy was utilized by the requesting physician.  No radiographic  interpretation.    [place] Lt Breast Bx W Loc Dev 1st Lesion Img Bx Spec US Guide  05/22/2013   **ADDENDUM** CREATED: 05/22/2013 11:12:58  The patient was called by myself, Dr. Micheline Maze today.  The patient states she is doing well since her left axillary node biopsy without problems at the biopsy site.  The patient was given the results of her biopsy which was compatible with metastatic invasive mammary carcinoma.  This is concordant with imaging findings and patient's known history of recent positive biopsy of a large inner upper left breast mass demonstrating invasive mammary carcinoma. The patient had her breast MRI this morning and will receive Port-A- Cath placement by Dr. Dwain Sarna this afternoon.  Recommend follow- up pending MRI results and per treatment plan.  **END ADDENDUM** SIGNED BY: Melton Alar. Micheline Maze, M.D.  05/21/2013   *RADIOLOGY REPORT*  Clinical Data:  New diagnosis of invasive mammary carcinoma in the left  upper inner quadrant.  Abnormal lymph node noted on ultrasound dated 05/08/2013.  ULTRASOUND GUIDED CORE BIOPSY OF THE LEFT AXILLA  The patient and I discussed the procedure of ultrasound-guided biopsy, including benefits and alternatives.  We discussed the high likelihood of a successful procedure. We discussed the risks of the procedure, including infection, bleeding, tissue injury, clip migration, and inadequate sampling.  Written informed consent was given. Appropriate time-out was performed.  Using sterile technique, 2% lidocaine, ultrasound guidance, and a 14 gauge automated biopsy device, biopsy was performed of the abnormal left axillary lymph node using a lateromedial approach. At the conclusion of the procedure, a dumbbell shaped tissue marker clip was deployed into the biopsy cavity. Post biopsy mammogram was not performed.  IMPRESSION: Ultrasound guided biopsy of an abnormal left axillary lymph node. No apparent complications.  Original Report Authenticated By: Cain Saupe, M.D.    ASSESSMENT:   1. Patient is a 41 year old female with high grade invasive breast carcinoma ER 14%, PR negative, HER-2/neu negative.  She was evaluated by Dr. Dwain Sarna.      #2 patient had CT and PET scan performed. She has no evidence of metastatic disease distantly.  #3 she was begun on neoadjuvant chemotherapy consisting of Adriamycin Cytoxan given Q2 weekly beginning on 06/08/2013. She completed this regimen, and went on to receive Taxol/Carbo weekly. She received 1 cycle of this and it was discontinued due to progressive neuropathy.  She started Gemzar/Carbo on 8/22. She will receive 6 cycles of this.  Currently on cycle 5 day 1.    #4 anemia due to menorrhagia. She is now status post feraheme given on 06/13/2013.  #5 Pain controlled on Percocet and Fentanyl Patch. We did receive a letter from her insurance company due that reported she has had three providers prescribing her narcotics since April.  This  was discussed with Mrs. Kiraly and she will  only receive pain medication from myself or Dr. Welton Flakes.   PLAN:  1. Patient is doing well. She will proceed with chemotherapy today.  I reviewed her labs with her in detail.    2. Patient will undergo MRI on 10/20 and f/u with Dr. Dwain Sarna 10/28.  3. Patient will continue with current pain regimen.    4. She will return tomorrow for Neulasta and in 1 week for chemotoxicities.    All questions were answered. The patient knows to call the clinic with any problems, questions or concerns. We can certainly see the patient much sooner if necessary.  I spent 25 minutes counseling the patient face to face. The total time spent in the appointment was 30 minutes.  Illa Level, NP Medical Oncology [facility] [phone] 09/29/2013, 12:29 PM

## 2013-09-28 NOTE — Patient Instructions (Signed)
[facility] Discharge Instructions for Patients Receiving Chemotherapy  Today you received the following chemotherapy agents gemzar, carboplatin  To help prevent nausea and vomiting after your treatment, we encourage you to take your nausea medication as needed   If you develop nausea and vomiting that is not controlled by your nausea medication, call the clinic.   BELOW ARE SYMPTOMS THAT SHOULD BE REPORTED IMMEDIATELY:  *FEVER GREATER THAN 100.5 F  *CHILLS WITH OR WITHOUT FEVER  NAUSEA AND VOMITING THAT IS NOT CONTROLLED WITH YOUR NAUSEA MEDICATION  *UNUSUAL SHORTNESS OF BREATH  *UNUSUAL BRUISING OR BLEEDING  TENDERNESS IN MOUTH AND THROAT WITH OR WITHOUT PRESENCE OF ULCERS  *URINARY PROBLEMS  *BOWEL PROBLEMS  UNUSUAL RASH Items with * indicate a potential emergency and should be followed up as soon as possible.  Feel free to call the clinic you have any questions or concerns. The clinic phone number is [phone].

## 2013-09-29 ENCOUNTER — Ambulatory Visit (HOSPITAL_BASED_OUTPATIENT_CLINIC_OR_DEPARTMENT_OTHER): Payer: No Typology Code available for payment source

## 2013-09-29 VITALS — BP 116/77 | HR 107 | Temp 97.7°F | Resp 20

## 2013-09-29 DIAGNOSIS — Z5189 Encounter for other specified aftercare: Secondary | ICD-10-CM

## 2013-09-29 DIAGNOSIS — C50219 Malignant neoplasm of upper-inner quadrant of unspecified female breast: Secondary | ICD-10-CM

## 2013-09-29 MED ORDER — PEGFILGRASTIM INJECTION 6 MG/0.6ML
6.0000 mg | Freq: Once | SUBCUTANEOUS | Status: AC
  Administered 2013-09-29: 6 mg via SUBCUTANEOUS

## 2013-10-01 ENCOUNTER — Ambulatory Visit (HOSPITAL_COMMUNITY): Admission: RE | Admit: 2013-10-01 | Payer: Medicaid Other | Source: Ambulatory Visit

## 2013-10-04 ENCOUNTER — Ambulatory Visit (HOSPITAL_COMMUNITY)
Admission: RE | Admit: 2013-10-04 | Discharge: 2013-10-04 | Disposition: A | Discharge status: Home / Self Care | Payer: No Typology Code available for payment source | Source: Ambulatory Visit | Attending: Adult Health | Admitting: Adult Health

## 2013-10-04 DIAGNOSIS — C50919 Malignant neoplasm of unspecified site of unspecified female breast: Secondary | ICD-10-CM | POA: Insufficient documentation

## 2013-10-04 DIAGNOSIS — R234 Changes in skin texture: Secondary | ICD-10-CM | POA: Insufficient documentation

## 2013-10-04 DIAGNOSIS — C50212 Malignant neoplasm of upper-inner quadrant of left female breast: Secondary | ICD-10-CM

## 2013-10-04 MED ORDER — GADOBENATE DIMEGLUMINE 529 MG/ML IV SOLN
20.0000 mL | Freq: Once | INTRAVENOUS | Status: AC | PRN
  Administered 2013-10-04: 20 mL via INTRAVENOUS

## 2013-10-05 ENCOUNTER — Encounter: Payer: Self-pay | Admitting: Oncology

## 2013-10-05 ENCOUNTER — Other Ambulatory Visit (HOSPITAL_BASED_OUTPATIENT_CLINIC_OR_DEPARTMENT_OTHER): Payer: No Typology Code available for payment source | Admitting: Lab

## 2013-10-05 ENCOUNTER — Ambulatory Visit (HOSPITAL_BASED_OUTPATIENT_CLINIC_OR_DEPARTMENT_OTHER): Payer: No Typology Code available for payment source | Admitting: Oncology

## 2013-10-05 VITALS — BP 124/82 | HR 110 | Temp 98.6°F | Resp 19 | Ht 65.0 in | Wt 239.1 lb

## 2013-10-05 DIAGNOSIS — C50212 Malignant neoplasm of upper-inner quadrant of left female breast: Secondary | ICD-10-CM

## 2013-10-05 DIAGNOSIS — C50219 Malignant neoplasm of upper-inner quadrant of unspecified female breast: Secondary | ICD-10-CM

## 2013-10-05 DIAGNOSIS — I82401 Acute embolism and thrombosis of unspecified deep veins of right lower extremity: Secondary | ICD-10-CM

## 2013-10-05 DIAGNOSIS — N920 Excessive and frequent menstruation with regular cycle: Secondary | ICD-10-CM

## 2013-10-05 DIAGNOSIS — M62838 Other muscle spasm: Secondary | ICD-10-CM

## 2013-10-05 DIAGNOSIS — D500 Iron deficiency anemia secondary to blood loss (chronic): Secondary | ICD-10-CM

## 2013-10-05 LAB — CBC WITH DIFFERENTIAL/PLATELET
BASO%: 0.3 % (ref 0.0–2.0)
EOS%: 0.2 % (ref 0.0–7.0)
HCT: 32.4 % — ABNORMAL LOW (ref 34.8–46.6)
HGB: 10.5 g/dL — ABNORMAL LOW (ref 11.6–15.9)
LYMPH%: 9.1 % — ABNORMAL LOW (ref 14.0–49.7)
MCH: 31.2 pg (ref 25.1–34.0)
MCHC: 32.5 g/dL (ref 31.5–36.0)
MONO#: 0.6 10*3/uL (ref 0.1–0.9)
NEUT#: 28.4 10*3/uL — ABNORMAL HIGH (ref 1.5–6.5)
NEUT%: 88.5 % — ABNORMAL HIGH (ref 38.4–76.8)
Platelets: 340 10*3/uL (ref 145–400)
RBC: 3.38 10*6/uL — ABNORMAL LOW (ref 3.70–5.45)
WBC: 32 10*3/uL — ABNORMAL HIGH (ref 3.9–10.3)

## 2013-10-05 LAB — COMPREHENSIVE METABOLIC PANEL (CC13)
ALT: 64 U/L — ABNORMAL HIGH (ref 0–55)
AST: 27 U/L (ref 5–34)
Alkaline Phosphatase: 251 U/L — ABNORMAL HIGH (ref 40–150)
Creatinine: 0.9 mg/dL (ref 0.6–1.1)
Sodium: 141 mEq/L (ref 136–145)
Total Bilirubin: <0.2 mg/dL (ref 0.20–1.20)
Total Protein: 7.5 g/dL (ref 6.4–8.3)

## 2013-10-05 MED ORDER — FENTANYL 75 MCG/HR TD PT72: 1.0000 | MEDICATED_PATCH | TRANSDERMAL | Status: DC

## 2013-10-05 MED ORDER — CYCLOBENZAPRINE HCL 5 MG PO TABS: 5.0000 mg | ORAL_TABLET | Freq: Three times a day (TID) | ORAL | Status: DC | PRN

## 2013-10-05 NOTE — Progress Notes (Signed)
OFFICE PROGRESS NOTE  CC**  Erika Flake, MD 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    PRIOR THERAPY: 1. 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 2 months ago this began getting larger rapidly. She was seen in an urgent care where this mass was thought to be an abscess. Apparently there was an attempt to excise or drain this that was not successful and she was referred to Dr. Dwain Sarna for evaluation. She underwent punch biopsy on 04/23/13 of her skin and the mass. She was then also sent for mm/us. The biopsy shows invasive high grade carcinoma that appears to be breast. It is ER 14%, PR negative, HER-2/neu negative with a Ki-67 that approaches 100%. She has also undergone mm with at least 10 cm upper inner left breast tumor with mild overyling skin thickening. There is also a 3x10 mm cluster of calcs near there also. There is indeterminate 7 mm level 1 left axillary node, she underwent a lymph node biopsy that was positive for disease. No mm evidence of right breast malignancy.  An MRI of the breasts was done on 05/22/13 and showed a 14 cm mass in the left breast protruding from the skin and also a 2.5 cm level II axillary node.   2.  Patient evaluated and began on neoadjuvant chemotherapy.  Adriamycin and Cytoxan began on 05/25/13.  She completed 4 cycles with Neulasta support.   Taxol carbo began on 8/8. 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  #3 staging PET/CT performed on 06/13/2013 revealed no evidence of distant disease.  #4 anemia of iron deficiency  CURRENT THERAPY: s/p gemzar/Carbo cycle 5  INTERVAL HISTORY: Erika [name] 42 y.o. female returns for followup visit after receiving cycle 5 of her treatment. She is literally wiped out. Her pain is well-controlled. She is  resting. Her pain is controlled with fentanyl patches. A new prescription was given to her today. Muscle spasms are well-controlled with Flexeril and a new prescription was given to her today as well. She is denying any nausea vomiting fevers chills night sweats headaches she has no shortness of breath no chest pains no palpitations. She is very pleased with the results of her MRI today. Remainder of the 10 point review of systems is negative.  MEDICAL HISTORY: Past Medical History  Diagnosis Date  . Breast cancer     Invasive High Grade Carcinoma  . Headache(784.0)     Hx: of Migraines  . Status post chemotherapy     Adriamycin and Cytoxan    ALLERGIES:  has No Known Allergies.  MEDICATIONS:  Current Outpatient Prescriptions  Medication Sig Dispense Refill  . cyclobenzaprine (FLEXERIL) 5 MG tablet Take 1 tablet (5 mg total) by mouth 3 (three) times daily as needed for muscle spasms.  30 tablet  0  . dexamethasone (DECADRON) 4 MG tablet Take 2 tablets (8 mg total) by mouth See admin instructions. Take daily for 3 days starting day after chemo  30 tablet  0  . enoxaparin (LOVENOX) 150 MG/ML injection Inject 1 mL (150 mg total) into the skin daily.  30 Syringe  6  . fentaNYL (DURAGESIC - DOSED MCG/HR) 75 MCG/HR Place 1 patch (75 mcg total) onto the skin every 3 (three) days.  10 patch  0  . ibuprofen (ADVIL,MOTRIN)  200 MG tablet Take 600-800 mg by mouth every 4 (four) hours as needed for pain.       Marland Kitchen lidocaine-prilocaine (EMLA) cream Apply 1 application topically every 14 (fourteen) days. Every other Friday prior to chemo      . LORazepam (ATIVAN) 0.5 MG tablet Take 1 tablet (0.5 mg total) by mouth every 6 (six) hours as needed for anxiety.  30 tablet  0  . oxyCODONE (OXY IR/ROXICODONE) 5 MG immediate release tablet Take 1 tablet (5 mg total) by mouth every 4 (four) hours as needed for pain.  60 tablet  0  . potassium chloride SA (K-DUR,KLOR-CON) 20 MEQ tablet Take 20 mEq by mouth daily.       Marland Kitchen zolpidem (AMBIEN) 5 MG tablet Take 1 tablet (5 mg total) by mouth at bedtime as needed for sleep.  30 tablet  0   No current facility-administered medications for this visit.    SURGICAL HISTORY:  Past Surgical History  Procedure Laterality Date  . Tubal ligation    . Portacath placement Right 05/22/2013    Procedure: INSERTION PORT-A-CATH;  Surgeon: Emelia Loron, MD;  Location: [facility] OR;  Service: General;  Laterality: Right;  . Left breast needle core biopsy Left 05/21/13  . Left breast needle core  biopsy Left 06/22/13    REVIEW OF SYSTEMS:  A 10 point review of systems was conducted and is otherwise negative except for what is noted above.    PHYSICAL EXAMINATION: Blood pressure 124/82, pulse 110, temperature 98.6 F (37 C), temperature source Oral, resp. rate 19, height 5\' 5"  (1.651 m), weight 239 lb 1.6 oz (108.455 kg), last menstrual period 05/30/2013. Body mass index is 39.79 kg/(m^2). General: Patient is a well appearing female in no acute distress HEENT: PERRLA, sclerae anicteric no conjunctival pallor, MMM Neck: supple, no palpable adenopathy Lungs: clear to auscultation bilaterally, no wheezes, rhonchi, or rales Cardiovascular: regular rate rhythm, S1, S2, no murmurs, rubs or gallops Abdomen: Soft, non-tender, non-distended, normoactive bowel sounds, no HSM Extremities: warm and well perfused, no clubbing, cyanosis, or edema Skin: No rashes or lesions Neuro: Non-focal Breasts: left breast mass is much smaller, now hardly palpable, skin change from where mass ulcerated is evident with mild thickness, however lesion is much improved.  Under the left breast there is a linear lesion about 3 cm it is covered by a dressing at todays visit and the dressing is clean dry and intact.  She changed it yesterday.   ECOG PERFORMANCE STATUS: 1 - Symptomatic but completely ambulatory  LABORATORY DATA: Lab Results  Component Value Date   WBC 32.0* 10/05/2013   HGB 10.5* 10/05/2013    HCT 32.4* 10/05/2013   MCV 96.0 10/05/2013   PLT 340 10/05/2013      Chemistry      Component Value Date/Time   NA 138 09/28/2013 1123   NA 139 05/18/2013 1205   K 3.6 09/28/2013 1123   K 3.8 05/18/2013 1205   CL 102 06/01/2013 1500   CL 103 05/18/2013 1205   CO2 24 09/28/2013 1123   CO2 28 05/18/2013 1205   BUN 6.0* 09/28/2013 1123   BUN 8 05/18/2013 1205   CREATININE 0.8 09/28/2013 1123   CREATININE 0.84 05/18/2013 1205      Component Value Date/Time   CALCIUM 9.4 09/28/2013 1123   CALCIUM 9.1 05/18/2013 1205   ALKPHOS 138 09/28/2013 1123   ALKPHOS 111 05/18/2013 1205   AST 28 09/28/2013 1123   AST 18 05/18/2013 1205  ALT 55 09/28/2013 1123   ALT 16 05/18/2013 1205   BILITOT <0.20 09/28/2013 1123   BILITOT 0.2* 05/18/2013 1205       RADIOGRAPHIC STUDIES:  Chest 2 View  05/18/2013   *RADIOLOGY REPORT*  Clinical Data: Breast carcinoma  CHEST - 2 VIEW  Comparison: None.  Findings: Probable left breast mass. Lungs clear.  Heart size and pulmonary vascularity normal.  No effusion.  Visualized bones unremarkable.  IMPRESSION: No acute disease   Original Report Authenticated By: D. Andria Rhein, MD   US Breast Left  05/08/2013   *RADIOLOGY REPORT*  Clinical Data:  42 year old female with large left breast mass - recent punch biopsy demonstrating high-grade carcinoma.  DIGITAL DIAGNOSTIC BILATERAL MAMMOGRAM WITH CAD AND LEFT BREAST ULTRASOUND:  Comparison:  None.  Findings:  ACR Breast Density Category 2: There is a scattered fibroglandular pattern.  A large irregular mass within the upper inner posterior left breast is identified - measuring at least 10 cm. Mild overlying skin thickening is identified compatible with dermal invasion. A 3 x 10 mm cluster of slightly heterogeneous left breast calcifications approximate 4 cm anterior and lateral to this mass is identified.  There is no evidence of suspicious mass, distortion or worrisome calcifications within the right breast. Mammographic images were  processed with CAD.  On physical exam, a very large protuberant mass within the upper inner left breast is identified.  Ultrasound is performed, showing a large heterogeneous mass within the upper inner left breast at the 10-11 o'clock position, measuring at least 6.5 cm, but evaluation is limited by transducer skin area and protuberant portion of the mass. A 7 mm level I left axillary lymph node is identified with slightly echogenic rim - suspicious.  IMPRESSION: Large biopsy-proven carcinoma within the upper inner left breast, measuring at least 10 cm mammographically. Mild overlying skin thickening compatible with dermal invasion.  3 x 10 mm cluster of indeterminate left breast calcifications 4 cm anterior and lateral to the biopsy-proven neoplasm.  This may represent DCIS and consider tissue sampling.  Indeterminate 7 mm level I left axillary lymph node - metastatic disease not excluded.  No mammographic evidence of right breast malignancy.  BI-RADS CATEGORY 6:  Known biopsy-proven malignancy - appropriate action should be taken.  RECOMMENDATION: Treatment plan.  Consider biopsies of the indeterminate left axillary lymph node and left breast calcifications as clinically indicated.  Consider bilateral breast MRI as clinically indicated.  I have discussed the findings and recommendations with the patient. Results were also provided in writing at the conclusion of the visit.   Original Report Authenticated By: Harmon Pier, M.D.   Mr Breast Bilateral W Wo Contrast  05/22/2013   *RADIOLOGY REPORT*  Clinical Data: large invasive carcinoma left upper inner quadrant with dermal involvement  BILATERAL BREAST MRI WITH AND WITHOUT CONTRAST  Technique: Multiplanar, multisequence MR images of both breasts were obtained prior to and following the intravenous administration of 20ml of Multihance.  Three dimensional images were evaluated at the independent DynaCad workstation.  Comparison:  05/08/13  Findings: There is  moderate background parenchymal enhancement. The right breast and axilla are negative.  On the left, there is a large irregular mass with irregular borders in the upper inner quadrant, demonstrating heterogenous internal enhancement.  The mass measures 14 (AP x 7.5 (lateral) x 8 (craniocaudal)cm.  It causes protrusion of the skin of the breast in the 10 - 11 o'clock position, and it is associated with edematous change in the skin of the  left breast, primarily in the upper inner quadrant but also in the periareolar in more infra- areolar left breast.  The mass extends through pectoralis major musculature posteriorly, with enhancing mass extending all the way to the posterior edge of the muscle.  In the 2 o'clock position posteriorly there is a 9mm oval enhancing mass with a mixture of plateau and washout kinetics; it is an indeterminate finding but shows the suggestion of a fatty hilum, and it may be an intramammary lymph node.  In the left axilla, there is an enlarged level II lymph node demonstrating hypervascularity and cortical thickening.  It measures 2.5cm in greatest dimension, and the appearance is concerning for metastasis.  There are no other abnormalities on the left.  IMPRESSION: Large left breast malignancy associated with involvement of the dermis and the chest wall, as well as with probable axillary lymph node metastasis.  There is an indeterminate 9mm mass in the 2 o'clock position, that may be an intramammary lymph node.  BI-RADS CATEGORY 6:  Known biopsy-proven malignancy - appropriate action should be taken.  THREE-DIMENSIONAL MR IMAGE RENDERING ON INDEPENDENT WORKSTATION:  Three-dimensional MR images were rendered by post-processing of the original MR data on an independent workstation.  The three- dimensional MR images were interpreted, and findings were reported in the accompanying complete MRI report for this study.   Original Report Authenticated By: Esperanza Heir, M.D.   Dg Chest Port 1  View  05/22/2013   *RADIOLOGY REPORT*  Clinical Data: Port-A-Cath placement.  PORTABLE CHEST - 1 VIEW  Comparison: 05/18/2013  Findings: Right-sided power injectable Port-A-Cath noted with distal tip projecting over the SVC.  Low lung volumes are present, causing crowding of the pulmonary vasculature.  No pneumothorax noted.  Bilateral midlung subsegmental atelectasis noted.  Borderline cardiomegaly.  IMPRESSION:  1.  Power injectable catheter tip:  SVC.  No pneumothorax.   Original Report Authenticated By: Gaylyn Rong, M.D.   Mm Digital Diagnostic Bilat  05/08/2013   *RADIOLOGY REPORT*  Clinical Data:  42 year old female with large left breast mass - recent punch biopsy demonstrating high-grade carcinoma.  DIGITAL DIAGNOSTIC BILATERAL MAMMOGRAM WITH CAD AND LEFT BREAST ULTRASOUND:  Comparison:  None.  Findings:  ACR Breast Density Category 2: There is a scattered fibroglandular pattern.  A large irregular mass within the upper inner posterior left breast is identified - measuring at least 10 cm. Mild overlying skin thickening is identified compatible with dermal invasion. A 3 x 10 mm cluster of slightly heterogeneous left breast calcifications approximate 4 cm anterior and lateral to this mass is identified.  There is no evidence of suspicious mass, distortion or worrisome calcifications within the right breast. Mammographic images were processed with CAD.  On physical exam, a very large protuberant mass within the upper inner left breast is identified.  Ultrasound is performed, showing a large heterogeneous mass within the upper inner left breast at the 10-11 o'clock position, measuring at least 6.5 cm, but evaluation is limited by transducer skin area and protuberant portion of the mass. A 7 mm level I left axillary lymph node is identified with slightly echogenic rim - suspicious.  IMPRESSION: Large biopsy-proven carcinoma within the upper inner left breast, measuring at least 10 cm mammographically.  Mild overlying skin thickening compatible with dermal invasion.  3 x 10 mm cluster of indeterminate left breast calcifications 4 cm anterior and lateral to the biopsy-proven neoplasm.  This may represent DCIS and consider tissue sampling.  Indeterminate 7 mm level I left axillary lymph  node - metastatic disease not excluded.  No mammographic evidence of right breast malignancy.  BI-RADS CATEGORY 6:  Known biopsy-proven malignancy - appropriate action should be taken.  RECOMMENDATION: Treatment plan.  Consider biopsies of the indeterminate left axillary lymph node and left breast calcifications as clinically indicated.  Consider bilateral breast MRI as clinically indicated.  I have discussed the findings and recommendations with the patient. Results were also provided in writing at the conclusion of the visit.   Original Report Authenticated By: Harmon Pier, M.D.   Dg Fluoro Guide Cv Line-no Report  05/22/2013   CLINICAL DATA: surgery   FLOURO GUIDE CV LINE  Fluoroscopy was utilized by the requesting physician.  No radiographic  interpretation.    [place] Lt Breast Bx W Loc Dev 1st Lesion Img Bx Spec US Guide  05/22/2013   **ADDENDUM** CREATED: 05/22/2013 11:12:58  The patient was called by myself, Dr. Micheline Maze today.  The patient states she is doing well since her left axillary node biopsy without problems at the biopsy site.  The patient was given the results of her biopsy which was compatible with metastatic invasive mammary carcinoma.  This is concordant with imaging findings and patient's known history of recent positive biopsy of a large inner upper left breast mass demonstrating invasive mammary carcinoma. The patient had her breast MRI this morning and will receive Port-A- Cath placement by Dr. Dwain Sarna this afternoon.  Recommend follow- up pending MRI results and per treatment plan.  **END ADDENDUM** SIGNED BY: Melton Alar. Micheline Maze, M.D.  05/21/2013   *RADIOLOGY REPORT*  Clinical Data:  New diagnosis of invasive  mammary carcinoma in the left upper inner quadrant.  Abnormal lymph node noted on ultrasound dated 05/08/2013.  ULTRASOUND GUIDED CORE BIOPSY OF THE LEFT AXILLA  The patient and I discussed the procedure of ultrasound-guided biopsy, including benefits and alternatives.  We discussed the high likelihood of a successful procedure. We discussed the risks of the procedure, including infection, bleeding, tissue injury, clip migration, and inadequate sampling.  Written informed consent was given. Appropriate time-out was performed.  Using sterile technique, 2% lidocaine, ultrasound guidance, and a 14 gauge automated biopsy device, biopsy was performed of the abnormal left axillary lymph node using a lateromedial approach. At the conclusion of the procedure, a dumbbell shaped tissue marker clip was deployed into the biopsy cavity. Post biopsy mammogram was not performed.  IMPRESSION: Ultrasound guided biopsy of an abnormal left axillary lymph node. No apparent complications.  Original Report Authenticated By: Cain Saupe, M.D.    ASSESSMENT:   1. Patient is a 42 year old female with high grade invasive breast carcinoma ER 14%, PR negative, HER-2/neu negative.  She was evaluated by Dr. Dwain Sarna.      #2 patient had CT and PET scan performed. She has no evidence of metastatic disease distantly.  #3 she was begun on neoadjuvant chemotherapy consisting of Adriamycin Cytoxan given Q2 weekly beginning on 06/08/2013. She completed this regimen, and went on to receive Taxol/Carbo weekly. She received 1 cycle of this and it was discontinued due to progressive neuropathy.  She started Gemzar/Carbo on 8/22. She will receive 6 cycles of this.  Currently on cycle 5 day 1.    #4 anemia due to menorrhagia. She is now status post feraheme given on 06/13/2013.  #5 Pain controlled on Percocet and Fentanyl Patch. We did receive a letter from her insurance company due that reported she has had three providers prescribing her  narcotics since April.  This was  discussed with Mrs. Coffel and she will only receive pain medication from myself or Dr. Welton Flakes.   PLAN:  #1 patient tolerated cycle 5 of Gemzar carboplatinum. She has one more cycle next week. Her blood count looks terrific today.  #2 she had MRIs of the breasts performed she's had clinically as well as radiologically significant response. I went over the MRI results with her in detail I showed her the pictures. She and her husband were very pleased.  #3 patient will be seen back in one week's time for cycle 6 of Gemzar and Carbo.  #4 patient does have an appointment set up to be seen by Dr. Dwain Sarna in one week's time for discussion of definitive surgery.  All questions were answered. The patient knows to call the clinic with any problems, questions or concerns. We can certainly see the patient much sooner if necessary.  I spent 25 minutes counseling the patient face to face. The total time spent in the appointment was 30 minutes.  Drue Second, MD Medical/Oncology [facility] [phone] (beeper) [phone] (Office)  10/05/2013, 9:31 AM

## 2013-10-07 NOTE — Progress Notes (Signed)
I think she was inflammatory

## 2013-10-09 ENCOUNTER — Ambulatory Visit (INDEPENDENT_AMBULATORY_CARE_PROVIDER_SITE_OTHER): Payer: PRIVATE HEALTH INSURANCE | Admitting: General Surgery

## 2013-10-09 ENCOUNTER — Encounter: Payer: Self-pay | Admitting: Genetic Counselor

## 2013-10-09 ENCOUNTER — Encounter (INDEPENDENT_AMBULATORY_CARE_PROVIDER_SITE_OTHER): Payer: Self-pay | Admitting: General Surgery

## 2013-10-09 ENCOUNTER — Telehealth (INDEPENDENT_AMBULATORY_CARE_PROVIDER_SITE_OTHER): Payer: Self-pay

## 2013-10-09 ENCOUNTER — Telehealth: Payer: Self-pay | Admitting: Genetic Counselor

## 2013-10-09 VITALS — BP 132/80 | HR 74 | Temp 97.0°F | Resp 16 | Ht 65.5 in | Wt 238.6 lb

## 2013-10-09 DIAGNOSIS — C50212 Malignant neoplasm of upper-inner quadrant of left female breast: Secondary | ICD-10-CM

## 2013-10-09 DIAGNOSIS — C50219 Malignant neoplasm of upper-inner quadrant of unspecified female breast: Secondary | ICD-10-CM

## 2013-10-09 NOTE — Telephone Encounter (Signed)
Called to add pt to breast cancer conference for 10/17/13.

## 2013-10-09 NOTE — Telephone Encounter (Signed)
Revealed negative genetic testing on breast/ovarian cancer panel. 

## 2013-10-09 NOTE — Progress Notes (Signed)
Patient ID: Erika [name], female   DOB: October 01, 1971, 42 y.o.   MRN: [id number]  Chief Complaint  Patient presents with  . Routine Post Op    breast ca 3 mo    HPI Erika [name] is a 42 y.o. female.   HPI 63 yof otherwise healthy works in [employer], [employer] (consulting) who had left sided breast mass show up about 6 months ago. 2 months ago this began getting larger much quicker. She eventually was seen in an urgent care where this mass was thought to be an abscess. Apparently there was an attempt to excise or drain this that was not successful and she was referred to our office for evaluation. She was seen in our urgent office and underwent punch biopsy of her skin and the mass. She was then also sent for mm/us. 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 It is her2 not amplified, er pos at 15, pr neg and Ki approaches 100%. She has also undergone mm with at least 10 cm upper inner left breast tumor with mild overyling skin thickening. There is also a 3x10 mm cluster of calcs near there also. There is indeterminate 7 mm level 1 left axillary node. No mm evidence of right breast malignancy. The MR showed large mass with another small area on left that is negative on prior biopsy.    Past Medical History  Diagnosis Date  . Breast cancer     Invasive High Grade Carcinoma  . Headache(784.0)     Hx: of Migraines  . Status post chemotherapy     Adriamycin and Cytoxan    Past Surgical History  Procedure Laterality Date  . Tubal ligation    . Portacath placement Right 05/22/2013    Procedure: INSERTION PORT-A-CATH;  Surgeon: Emelia Loron, MD;  Location: [facility] OR;  Service: General;  Laterality: Right;  . Left breast needle core biopsy Left 05/21/13  . Left breast needle core  biopsy Left 06/22/13    Family History  Problem Relation Age of Onset  . Hypertension Mother   . Diabetes Father 18  . Stomach cancer Maternal Aunt   . Hypertension Maternal Grandmother   .  Hypertension Maternal Grandfather   . Diabetes Paternal Grandmother   . Diabetes Paternal Grandfather   . Diabetes Paternal Uncle   . Diabetes Paternal Uncle   . Diabetes Paternal Uncle     Social History History  Substance Use Topics  . Smoking status: Never Smoker   . Smokeless tobacco: Never Used  . Alcohol Use: No    No Known Allergies  Current Outpatient Prescriptions  Medication Sig Dispense Refill  . cyclobenzaprine (FLEXERIL) 5 MG tablet Take 1 tablet (5 mg total) by mouth 3 (three) times daily as needed for muscle spasms.  30 tablet  0  . dexamethasone (DECADRON) 4 MG tablet Take 2 tablets (8 mg total) by mouth See admin instructions. Take daily for 3 days starting day after chemo  30 tablet  0  . enoxaparin (LOVENOX) 150 MG/ML injection Inject 1 mL (150 mg total) into the skin daily.  30 Syringe  6  . fentaNYL (DURAGESIC - DOSED MCG/HR) 75 MCG/HR Place 1 patch (75 mcg total) onto the skin every 3 (three) days.  10 patch  0  . ibuprofen (ADVIL,MOTRIN) 200 MG tablet Take 600-800 mg by mouth every 4 (four) hours as needed for pain.       Marland Kitchen lidocaine-prilocaine (EMLA) cream Apply 1 application topically every 14 (fourteen)  days. Every other Friday prior to chemo      . LORazepam (ATIVAN) 0.5 MG tablet Take 1 tablet (0.5 mg total) by mouth every 6 (six) hours as needed for anxiety.  30 tablet  0  . oxyCODONE (OXY IR/ROXICODONE) 5 MG immediate release tablet Take 1 tablet (5 mg total) by mouth every 4 (four) hours as needed for pain.  60 tablet  0  . potassium chloride SA (K-DUR,KLOR-CON) 20 MEQ tablet Take 20 mEq by mouth daily.      Marland Kitchen zolpidem (AMBIEN) 5 MG tablet Take 1 tablet (5 mg total) by mouth at bedtime as needed for sleep.  30 tablet  0   No current facility-administered medications for this visit.    Review of Systems Review of Systems  Blood pressure 132/80, pulse 74, temperature 97 F (36.1 C), temperature source Temporal, resp. rate 16, height 5' 5.5" (1.664  m), weight 238 lb 9.6 oz (108.228 kg), last menstrual period 05/30/2013.  Physical Exam Physical Exam  Constitutional: She appears well-developed and well-nourished.  Eyes: No scleral icterus.  Neck: Neck supple.  Cardiovascular: Normal rate, regular rhythm and normal heart sounds.   Pulmonary/Chest: Effort normal and breath sounds normal. Right breast exhibits no inverted nipple, no mass, no nipple discharge, no skin change and no tenderness. Left breast exhibits mass.    Lymphadenopathy:    She has no cervical adenopathy.    She has no axillary adenopathy.       Right: No supraclavicular adenopathy present.       Left: No supraclavicular adenopathy present.    Data Reviewed EXAM:  MR BILATERAL BREAST WITHOUT AND WITH CONTRAST  LABS: Does not apply  TECHNIQUE:  Multiplanar, multisequence MR images of both breasts were obtained  prior to and following the intravenous administration of 20ml of  MultiHance.  THREE-DIMENSIONAL MR IMAGE RENDERING ON INDEPENDENT WORKSTATION:  Three-dimensional MR images were rendered by post-processing of the  original MR data on an independent workstation. The  three-dimensional MR images were interpreted, and findings are  reported in the following complete MRI report for this study.  COMPARISON: Previous exams  FINDINGS:  Breast composition: b. Scattered fibroglandular tissue  Background parenchymal enhancement: Minimal  Right breast: No mass or abnormal enhancement.  Left breast: The previously noted mass is significantly smaller  compared to prior MRI of May 22, 2013. The area of enhancement in  the medial upper left breast currently measures 2.2 cm diameter, 8.6  cm anterior-posterior dimension and 4.1 cm in craniocaudal  dimension. The posterior extension of the enhancement abuts the  pectoralis muscle. The associated skin thickening in the medial left  breast persists but is decreased.  Lymph nodes: No abnormal appearing lymph nodes.  The previously noted  abnormal lymph nodes within the left axilla are normal appearing.  Ancillary findings: None.  IMPRESSION:  Previously noted left breast mass is significantly smaller post  neoadjuvant therapy compared to prior exam as described.    Assessment    Locally advanced left breast cancer     Plan    I think a mastectomy is still best surgical treatment for her although we discussed lumpectomy also.  She asked about bilateral mastectomies which I don't think is unreasonable given her age either.  She has had a great response from the tumor that was growing out of her skin previously and her nodes all appear normal right now.  She and her husband are going to discuss these options and then will come  back next week. I also discussed the alliance study for neoadjuvant chemo with negative nodes now and will ask the research nurse to discuss this with her also.         Addalyn Speedy 10/09/2013, 12:22 PM

## 2013-10-12 ENCOUNTER — Ambulatory Visit (HOSPITAL_BASED_OUTPATIENT_CLINIC_OR_DEPARTMENT_OTHER): Payer: No Typology Code available for payment source

## 2013-10-12 ENCOUNTER — Encounter: Payer: Self-pay | Admitting: Adult Health

## 2013-10-12 ENCOUNTER — Other Ambulatory Visit (HOSPITAL_BASED_OUTPATIENT_CLINIC_OR_DEPARTMENT_OTHER): Payer: PRIVATE HEALTH INSURANCE | Admitting: Lab

## 2013-10-12 ENCOUNTER — Ambulatory Visit (HOSPITAL_BASED_OUTPATIENT_CLINIC_OR_DEPARTMENT_OTHER): Payer: PRIVATE HEALTH INSURANCE | Admitting: Adult Health

## 2013-10-12 VITALS — BP 122/88 | HR 118 | Temp 98.5°F | Resp 18 | Ht 65.0 in | Wt 240.9 lb

## 2013-10-12 DIAGNOSIS — C50219 Malignant neoplasm of upper-inner quadrant of unspecified female breast: Secondary | ICD-10-CM

## 2013-10-12 DIAGNOSIS — D500 Iron deficiency anemia secondary to blood loss (chronic): Secondary | ICD-10-CM

## 2013-10-12 DIAGNOSIS — N920 Excessive and frequent menstruation with regular cycle: Secondary | ICD-10-CM

## 2013-10-12 DIAGNOSIS — I82401 Acute embolism and thrombosis of unspecified deep veins of right lower extremity: Secondary | ICD-10-CM

## 2013-10-12 DIAGNOSIS — C773 Secondary and unspecified malignant neoplasm of axilla and upper limb lymph nodes: Secondary | ICD-10-CM

## 2013-10-12 DIAGNOSIS — Z5111 Encounter for antineoplastic chemotherapy: Secondary | ICD-10-CM

## 2013-10-12 DIAGNOSIS — Z170 Estrogen receptor positive status [ER+]: Secondary | ICD-10-CM

## 2013-10-12 DIAGNOSIS — C50212 Malignant neoplasm of upper-inner quadrant of left female breast: Secondary | ICD-10-CM

## 2013-10-12 LAB — CBC WITH DIFFERENTIAL/PLATELET
BASO%: 0.3 % (ref 0.0–2.0)
Basophils Absolute: 0 10*3/uL (ref 0.0–0.1)
EOS%: 0.6 % (ref 0.0–7.0)
Eosinophils Absolute: 0 10*3/uL (ref 0.0–0.5)
HCT: 34.3 % — ABNORMAL LOW (ref 34.8–46.6)
HGB: 11.2 g/dL — ABNORMAL LOW (ref 11.6–15.9)
LYMPH%: 31.5 % (ref 14.0–49.7)
MCH: 31.6 pg (ref 25.1–34.0)
MCHC: 32.7 g/dL (ref 31.5–36.0)
MCV: 96.9 fL (ref 79.5–101.0)
NEUT#: 4 10*3/uL (ref 1.5–6.5)
NEUT%: 56.1 % (ref 38.4–76.8)
Platelets: 249 10*3/uL (ref 145–400)
lymph#: 2.3 10*3/uL (ref 0.9–3.3)

## 2013-10-12 LAB — COMPREHENSIVE METABOLIC PANEL (CC13)
Albumin: 3.1 g/dL — ABNORMAL LOW (ref 3.5–5.0)
Alkaline Phosphatase: 148 U/L (ref 40–150)
Anion Gap: 9 mEq/L (ref 3–11)
BUN: 5.2 mg/dL — ABNORMAL LOW (ref 7.0–26.0)
Calcium: 9.7 mg/dL (ref 8.4–10.4)
Creatinine: 0.8 mg/dL (ref 0.6–1.1)
Glucose: 116 mg/dl (ref 70–140)
Potassium: 4 mEq/L (ref 3.5–5.1)

## 2013-10-12 MED ORDER — HEPARIN SOD (PORK) LOCK FLUSH 100 UNIT/ML IV SOLN
500.0000 [IU] | Freq: Once | INTRAVENOUS | Status: AC | PRN
  Administered 2013-10-12: 500 [IU]
  Filled 2013-10-12: qty 5

## 2013-10-12 MED ORDER — ENOXAPARIN SODIUM 150 MG/ML ~~LOC~~ SOLN: 150.0000 mg | Freq: Every day | SUBCUTANEOUS | Status: DC

## 2013-10-12 MED ORDER — SODIUM CHLORIDE 0.9 % IJ SOLN
3.0000 mL | Freq: Once | INTRAMUSCULAR | Status: DC | PRN
  Filled 2013-10-12: qty 10

## 2013-10-12 MED ORDER — SODIUM CHLORIDE 0.9 % IV SOLN
Freq: Once | INTRAVENOUS | Status: AC
  Administered 2013-10-12: 10:00:00 via INTRAVENOUS

## 2013-10-12 MED ORDER — ONDANSETRON 8 MG/NS 50 ML IVPB
INTRAVENOUS | Status: AC
  Filled 2013-10-12: qty 8

## 2013-10-12 MED ORDER — ONDANSETRON 8 MG/50ML IVPB (CHCC)
8.0000 mg | Freq: Once | INTRAVENOUS | Status: AC
  Administered 2013-10-12: 8 mg via INTRAVENOUS

## 2013-10-12 MED ORDER — SODIUM CHLORIDE 0.9 % IV SOLN
299.2000 mg | Freq: Once | INTRAVENOUS | Status: AC
  Administered 2013-10-12: 300 mg via INTRAVENOUS
  Filled 2013-10-12: qty 30

## 2013-10-12 MED ORDER — SODIUM CHLORIDE 0.9 % IJ SOLN
10.0000 mL | INTRAMUSCULAR | Status: DC | PRN
  Administered 2013-10-12: 10 mL
  Filled 2013-10-12: qty 10

## 2013-10-12 MED ORDER — LORAZEPAM 0.5 MG PO TABS: 0.5000 mg | ORAL_TABLET | Freq: Four times a day (QID) | ORAL | Status: DC | PRN

## 2013-10-12 MED ORDER — SODIUM CHLORIDE 0.9 % IV SOLN
800.0000 mg/m2 | Freq: Once | INTRAVENOUS | Status: AC
  Administered 2013-10-12: 1710 mg via INTRAVENOUS
  Filled 2013-10-12: qty 44.97

## 2013-10-12 MED ORDER — DEXAMETHASONE SODIUM PHOSPHATE 10 MG/ML IJ SOLN
INTRAMUSCULAR | Status: AC
  Filled 2013-10-12: qty 1

## 2013-10-12 MED ORDER — OXYCODONE HCL 5 MG PO TABS: 5.0000 mg | ORAL_TABLET | ORAL | Status: DC | PRN

## 2013-10-12 MED ORDER — DEXAMETHASONE SODIUM PHOSPHATE 10 MG/ML IJ SOLN
10.0000 mg | Freq: Once | INTRAMUSCULAR | Status: AC
  Administered 2013-10-12: 10 mg via INTRAVENOUS

## 2013-10-12 MED ORDER — GOSERELIN ACETATE 3.6 MG ~~LOC~~ IMPL
3.6000 mg | DRUG_IMPLANT | SUBCUTANEOUS | Status: DC
  Administered 2013-10-12: 3.6 mg via SUBCUTANEOUS
  Filled 2013-10-12: qty 3.6

## 2013-10-12 NOTE — Patient Instructions (Signed)
[facility] Discharge Instructions for Patients Receiving Chemotherapy  Today you received the following chemotherapy agents carboplatin,gemzar and zoledex.  To help prevent nausea and vomiting after your treatment, we encourage you to take your nausea medication take as directed.   If you develop nausea and vomiting that is not controlled by your nausea medication, call the clinic.   BELOW ARE SYMPTOMS THAT SHOULD BE REPORTED IMMEDIATELY:  *FEVER GREATER THAN 100.5 F  *CHILLS WITH OR WITHOUT FEVER  NAUSEA AND VOMITING THAT IS NOT CONTROLLED WITH YOUR NAUSEA MEDICATION  *UNUSUAL SHORTNESS OF BREATH  *UNUSUAL BRUISING OR BLEEDING  TENDERNESS IN MOUTH AND THROAT WITH OR WITHOUT PRESENCE OF ULCERS  *URINARY PROBLEMS  *BOWEL PROBLEMS  UNUSUAL RASH Items with * indicate a potential emergency and should be followed up as soon as possible.  Feel free to call the clinic you have any questions or concerns. The clinic phone number is [phone].

## 2013-10-12 NOTE — Patient Instructions (Signed)
Doing well.  Congratulations.  Proceed with your chemotherapy.  Please call us if you have any questions or concerns.    We will see you back in 1 week.

## 2013-10-12 NOTE — Progress Notes (Addendum)
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    PRIOR THERAPY: 1. 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 2 months ago this began getting larger rapidly. She was seen in an urgent care where this mass was thought to be an abscess. Apparently there was an attempt to excise or drain this that was not successful and she was referred to Dr. Dwain Sarna for evaluation. She underwent punch biopsy on 04/23/13 of her skin and the mass. She was then also sent for mm/us. The biopsy shows invasive high grade carcinoma that appears to be breast. It is ER 14%, PR negative, HER-2/neu negative with a Ki-67 that approaches 100%. She has also undergone mm with at least 10 cm upper inner left breast tumor with mild overyling skin thickening. There is also a 3x10 mm cluster of calcs near there also. There is indeterminate 7 mm level 1 left axillary node, she underwent a lymph node biopsy that was positive for disease. No mm evidence of right breast malignancy.  An MRI of the breasts was done on 05/22/13 and showed a 14 cm mass in the left breast protruding from the skin and also a 2.5 cm level II axillary node.   2.  Patient evaluated and began on neoadjuvant chemotherapy.  Adriamycin and Cytoxan began on 05/25/13.  She completed 4 cycles with Neulasta support.   Taxol carbo began on 8/8. 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  #3 staging PET/CT performed on 06/13/2013 revealed no evidence of distant disease.  #4 anemia of iron deficiency  CURRENT THERAPY: s/p gemzar/Carbo cycle 6 day 1  INTERVAL HISTORY: Erika [name] 42 y.o. female returns for followup visit prior to receiving cycle 6, her final cycle of her treatment. She is literally wiped out. Her pain  is well-controlled. She is doing well today.  She had f/u after MRI with Dr. Dwain Sarna and they discussed surgery options.  She will f/u with him again on 11/7 for a definitive plan.  She denies fevers, chills, nausea, vomiting, constipation, diarrhea, or any further concerns.     MEDICAL HISTORY: Past Medical History  Diagnosis Date  . Breast cancer     Invasive High Grade Carcinoma  . Headache(784.0)     Hx: of Migraines  . Status post chemotherapy     Adriamycin and Cytoxan    ALLERGIES:  has No Known Allergies.  MEDICATIONS:  Current Outpatient Prescriptions  Medication Sig Dispense Refill  . cyclobenzaprine (FLEXERIL) 5 MG tablet Take 1 tablet (5 mg total) by mouth 3 (three) times daily as needed for muscle spasms.  30 tablet  0  . dexamethasone (DECADRON) 4 MG tablet Take 2 tablets (8 mg total) by mouth See admin instructions. Take daily for 3 days starting day after chemo  30 tablet  0  . enoxaparin (LOVENOX) 150 MG/ML injection Inject 1 mL (150 mg total) into the skin daily.  30 Syringe  6  . fentaNYL (DURAGESIC - DOSED MCG/HR) 75 MCG/HR Place 1 patch (75 mcg total) onto the skin every 3 (three) days.  10 patch  0  . ibuprofen (ADVIL,MOTRIN) 200 MG tablet Take 600-800 mg by mouth every 4 (four) hours as needed for pain.       Marland Kitchen lidocaine-prilocaine (  EMLA) cream Apply 1 application topically every 14 (fourteen) days. Every other Friday prior to chemo      . LORazepam (ATIVAN) 0.5 MG tablet Take 1 tablet (0.5 mg total) by mouth every 6 (six) hours as needed for anxiety.  30 tablet  0  . oxyCODONE (OXY IR/ROXICODONE) 5 MG immediate release tablet Take 1 tablet (5 mg total) by mouth every 4 (four) hours as needed for pain.  60 tablet  0  . potassium chloride SA (K-DUR,KLOR-CON) 20 MEQ tablet Take 20 mEq by mouth daily.      Marland Kitchen zolpidem (AMBIEN) 5 MG tablet Take 1 tablet (5 mg total) by mouth at bedtime as needed for sleep.  30 tablet  0   No current facility-administered medications  for this visit.    SURGICAL HISTORY:  Past Surgical History  Procedure Laterality Date  . Tubal ligation    . Portacath placement Right 05/22/2013    Procedure: INSERTION PORT-A-CATH;  Surgeon: Emelia Loron, MD;  Location: [facility] OR;  Service: General;  Laterality: Right;  . Left breast needle core biopsy Left 05/21/13  . Left breast needle core  biopsy Left 06/22/13    REVIEW OF SYSTEMS:  A 10 point review of systems was conducted and is otherwise negative except for what is noted above.    PHYSICAL EXAMINATION: Blood pressure 122/88, pulse 118, temperature 98.5 F (36.9 C), temperature source Oral, resp. rate 18, height 5\' 5"  (1.651 m), weight 240 lb 14.4 oz (109.272 kg), last menstrual period 05/30/2013. Body mass index is 40.09 kg/(m^2). General: Patient is a well appearing female in no acute distress HEENT: PERRLA, sclerae anicteric no conjunctival pallor, MMM Neck: supple, no palpable adenopathy Lungs: clear to auscultation bilaterally, no wheezes, rhonchi, or rales Cardiovascular: regular rate rhythm, S1, S2, no murmurs, rubs or gallops Abdomen: Soft, non-tender, non-distended, normoactive bowel sounds, no HSM Extremities: warm and well perfused, no clubbing, cyanosis, or edema Skin: No rashes or lesions Neuro: Non-focal Breasts: left breast mass is much smaller, now hardly palpable, skin change from where mass ulcerated is evident with mild thickness, however lesion is much improved.   ECOG PERFORMANCE STATUS: 1 - Symptomatic but completely ambulatory  LABORATORY DATA: Lab Results  Component Value Date   WBC 7.1 10/12/2013   HGB 11.2* 10/12/2013   HCT 34.3* 10/12/2013   MCV 96.9 10/12/2013   PLT 249 10/12/2013      Chemistry      Component Value Date/Time   NA 138 10/12/2013 0904   NA 139 05/18/2013 1205   K 4.0 10/12/2013 0904   K 3.8 05/18/2013 1205   CL 102 06/01/2013 1500   CL 103 05/18/2013 1205   CO2 24 10/12/2013 0904   CO2 28 05/18/2013 1205   BUN 5.2*  10/12/2013 0904   BUN 8 05/18/2013 1205   CREATININE 0.8 10/12/2013 0904   CREATININE 0.84 05/18/2013 1205      Component Value Date/Time   CALCIUM 9.7 10/12/2013 0904   CALCIUM 9.1 05/18/2013 1205   ALKPHOS 148 10/12/2013 0904   ALKPHOS 111 05/18/2013 1205   AST 32 10/12/2013 0904   AST 18 05/18/2013 1205   ALT 46 10/12/2013 0904   ALT 16 05/18/2013 1205   BILITOT <0.20 10/12/2013 0904   BILITOT 0.2* 05/18/2013 1205       RADIOGRAPHIC STUDIES:  Chest 2 View  05/18/2013   *RADIOLOGY REPORT*  Clinical Data: Breast carcinoma  CHEST - 2 VIEW  Comparison: None.  Findings: Probable left breast  mass. Lungs clear.  Heart size and pulmonary vascularity normal.  No effusion.  Visualized bones unremarkable.  IMPRESSION: No acute disease   Original Report Authenticated By: D. Andria Rhein, MD   US Breast Left  05/08/2013   *RADIOLOGY REPORT*  Clinical Data:  42 year old female with large left breast mass - recent punch biopsy demonstrating high-grade carcinoma.  DIGITAL DIAGNOSTIC BILATERAL MAMMOGRAM WITH CAD AND LEFT BREAST ULTRASOUND:  Comparison:  None.  Findings:  ACR Breast Density Category 2: There is a scattered fibroglandular pattern.  A large irregular mass within the upper inner posterior left breast is identified - measuring at least 10 cm. Mild overlying skin thickening is identified compatible with dermal invasion. A 3 x 10 mm cluster of slightly heterogeneous left breast calcifications approximate 4 cm anterior and lateral to this mass is identified.  There is no evidence of suspicious mass, distortion or worrisome calcifications within the right breast. Mammographic images were processed with CAD.  On physical exam, a very large protuberant mass within the upper inner left breast is identified.  Ultrasound is performed, showing a large heterogeneous mass within the upper inner left breast at the 10-11 o'clock position, measuring at least 6.5 cm, but evaluation is limited by transducer skin area and  protuberant portion of the mass. A 7 mm level I left axillary lymph node is identified with slightly echogenic rim - suspicious.  IMPRESSION: Large biopsy-proven carcinoma within the upper inner left breast, measuring at least 10 cm mammographically. Mild overlying skin thickening compatible with dermal invasion.  3 x 10 mm cluster of indeterminate left breast calcifications 4 cm anterior and lateral to the biopsy-proven neoplasm.  This may represent DCIS and consider tissue sampling.  Indeterminate 7 mm level I left axillary lymph node - metastatic disease not excluded.  No mammographic evidence of right breast malignancy.  BI-RADS CATEGORY 6:  Known biopsy-proven malignancy - appropriate action should be taken.  RECOMMENDATION: Treatment plan.  Consider biopsies of the indeterminate left axillary lymph node and left breast calcifications as clinically indicated.  Consider bilateral breast MRI as clinically indicated.  I have discussed the findings and recommendations with the patient. Results were also provided in writing at the conclusion of the visit.   Original Report Authenticated By: Harmon Pier, M.D.   Mr Breast Bilateral W Wo Contrast  05/22/2013   *RADIOLOGY REPORT*  Clinical Data: large invasive carcinoma left upper inner quadrant with dermal involvement  BILATERAL BREAST MRI WITH AND WITHOUT CONTRAST  Technique: Multiplanar, multisequence MR images of both breasts were obtained prior to and following the intravenous administration of 20ml of Multihance.  Three dimensional images were evaluated at the independent DynaCad workstation.  Comparison:  05/08/13  Findings: There is moderate background parenchymal enhancement. The right breast and axilla are negative.  On the left, there is a large irregular mass with irregular borders in the upper inner quadrant, demonstrating heterogenous internal enhancement.  The mass measures 14 (AP x 7.5 (lateral) x 8 (craniocaudal)cm.  It causes protrusion of the skin of  the breast in the 10 - 11 o'clock position, and it is associated with edematous change in the skin of the left breast, primarily in the upper inner quadrant but also in the periareolar in more infra- areolar left breast.  The mass extends through pectoralis major musculature posteriorly, with enhancing mass extending all the way to the posterior edge of the muscle.  In the 2 o'clock position posteriorly there is a 9mm oval enhancing mass with a  mixture of plateau and washout kinetics; it is an indeterminate finding but shows the suggestion of a fatty hilum, and it may be an intramammary lymph node.  In the left axilla, there is an enlarged level II lymph node demonstrating hypervascularity and cortical thickening.  It measures 2.5cm in greatest dimension, and the appearance is concerning for metastasis.  There are no other abnormalities on the left.  IMPRESSION: Large left breast malignancy associated with involvement of the dermis and the chest wall, as well as with probable axillary lymph node metastasis.  There is an indeterminate 9mm mass in the 2 o'clock position, that may be an intramammary lymph node.  BI-RADS CATEGORY 6:  Known biopsy-proven malignancy - appropriate action should be taken.  THREE-DIMENSIONAL MR IMAGE RENDERING ON INDEPENDENT WORKSTATION:  Three-dimensional MR images were rendered by post-processing of the original MR data on an independent workstation.  The three- dimensional MR images were interpreted, and findings were reported in the accompanying complete MRI report for this study.   Original Report Authenticated By: Esperanza Heir, M.D.   Dg Chest Port 1 View  05/22/2013   *RADIOLOGY REPORT*  Clinical Data: Port-A-Cath placement.  PORTABLE CHEST - 1 VIEW  Comparison: 05/18/2013  Findings: Right-sided power injectable Port-A-Cath noted with distal tip projecting over the SVC.  Low lung volumes are present, causing crowding of the pulmonary vasculature.  No pneumothorax noted.   Bilateral midlung subsegmental atelectasis noted.  Borderline cardiomegaly.  IMPRESSION:  1.  Power injectable catheter tip:  SVC.  No pneumothorax.   Original Report Authenticated By: Gaylyn Rong, M.D.   Mm Digital Diagnostic Bilat  05/08/2013   *RADIOLOGY REPORT*  Clinical Data:  42 year old female with large left breast mass - recent punch biopsy demonstrating high-grade carcinoma.  DIGITAL DIAGNOSTIC BILATERAL MAMMOGRAM WITH CAD AND LEFT BREAST ULTRASOUND:  Comparison:  None.  Findings:  ACR Breast Density Category 2: There is a scattered fibroglandular pattern.  A large irregular mass within the upper inner posterior left breast is identified - measuring at least 10 cm. Mild overlying skin thickening is identified compatible with dermal invasion. A 3 x 10 mm cluster of slightly heterogeneous left breast calcifications approximate 4 cm anterior and lateral to this mass is identified.  There is no evidence of suspicious mass, distortion or worrisome calcifications within the right breast. Mammographic images were processed with CAD.  On physical exam, a very large protuberant mass within the upper inner left breast is identified.  Ultrasound is performed, showing a large heterogeneous mass within the upper inner left breast at the 10-11 o'clock position, measuring at least 6.5 cm, but evaluation is limited by transducer skin area and protuberant portion of the mass. A 7 mm level I left axillary lymph node is identified with slightly echogenic rim - suspicious.  IMPRESSION: Large biopsy-proven carcinoma within the upper inner left breast, measuring at least 10 cm mammographically. Mild overlying skin thickening compatible with dermal invasion.  3 x 10 mm cluster of indeterminate left breast calcifications 4 cm anterior and lateral to the biopsy-proven neoplasm.  This may represent DCIS and consider tissue sampling.  Indeterminate 7 mm level I left axillary lymph node - metastatic disease not excluded.  No  mammographic evidence of right breast malignancy.  BI-RADS CATEGORY 6:  Known biopsy-proven malignancy - appropriate action should be taken.  RECOMMENDATION: Treatment plan.  Consider biopsies of the indeterminate left axillary lymph node and left breast calcifications as clinically indicated.  Consider bilateral breast MRI as clinically indicated.  I have discussed the findings and recommendations with the patient. Results were also provided in writing at the conclusion of the visit.   Original Report Authenticated By: Harmon Pier, M.D.   Dg Fluoro Guide Cv Line-no Report  05/22/2013   CLINICAL DATA: surgery   FLOURO GUIDE CV LINE  Fluoroscopy was utilized by the requesting physician.  No radiographic  interpretation.    [place] Lt Breast Bx W Loc Dev 1st Lesion Img Bx Spec US Guide  05/22/2013   **ADDENDUM** CREATED: 05/22/2013 11:12:58  The patient was called by myself, Dr. Micheline Maze today.  The patient states she is doing well since her left axillary node biopsy without problems at the biopsy site.  The patient was given the results of her biopsy which was compatible with metastatic invasive mammary carcinoma.  This is concordant with imaging findings and patient's known history of recent positive biopsy of a large inner upper left breast mass demonstrating invasive mammary carcinoma. The patient had her breast MRI this morning and will receive Port-A- Cath placement by Dr. Dwain Sarna this afternoon.  Recommend follow- up pending MRI results and per treatment plan.  **END ADDENDUM** SIGNED BY: Melton Alar. Micheline Maze, M.D.  05/21/2013   *RADIOLOGY REPORT*  Clinical Data:  New diagnosis of invasive mammary carcinoma in the left upper inner quadrant.  Abnormal lymph node noted on ultrasound dated 05/08/2013.  ULTRASOUND GUIDED CORE BIOPSY OF THE LEFT AXILLA  The patient and I discussed the procedure of ultrasound-guided biopsy, including benefits and alternatives.  We discussed the high likelihood of a successful procedure. We  discussed the risks of the procedure, including infection, bleeding, tissue injury, clip migration, and inadequate sampling.  Written informed consent was given. Appropriate time-out was performed.  Using sterile technique, 2% lidocaine, ultrasound guidance, and a 14 gauge automated biopsy device, biopsy was performed of the abnormal left axillary lymph node using a lateromedial approach. At the conclusion of the procedure, a dumbbell shaped tissue marker clip was deployed into the biopsy cavity. Post biopsy mammogram was not performed.  IMPRESSION: Ultrasound guided biopsy of an abnormal left axillary lymph node. No apparent complications.  Original Report Authenticated By: Cain Saupe, M.D.    ASSESSMENT:   1. Patient is a 42 year old female with high grade invasive breast carcinoma ER 14%, PR negative, HER-2/neu negative.  She was evaluated by Dr. Dwain Sarna.      #2 patient had CT and PET scan performed. She has no evidence of metastatic disease distantly.  #3 she was begun on neoadjuvant chemotherapy consisting of Adriamycin Cytoxan given Q2 weekly beginning on 06/08/2013. She completed this regimen, and went on to receive Taxol/Carbo weekly. She received 1 cycle of this and it was discontinued due to progressive neuropathy.  She started Gemzar/Carbo on 8/22. She will receive 6 cycles of this.  Currently on cycle 6 day 1.    #4 anemia due to menorrhagia. She is now status post feraheme given on 06/13/2013.  #5 Pain controlled on Percocet and Fentanyl Patch. We did receive a letter from her insurance company due that reported she has had three providers prescribing her narcotics since April.  This was discussed with Erika [name] and she will only receive pain medication from myself or Dr. Welton Flakes.   PLAN:  #1 Doing well.  Labs are stable.  Patient will proceed with her final cycle of chemotherapy today as well as Zoladex.    #2 She will f/u with Dr. Dwain Sarna on 10/19/13 and they will make a  surgical plan.    #3 She will return tomorrow for neulasta and in one week for labs and a nadir check.    All questions were answered. The patient knows to call the clinic with any problems, questions or concerns. We can certainly see the patient much sooner if necessary.  I spent 25 minutes counseling the patient face to face. The total time spent in the appointment was 30 minutes.  Illa Level, NP Medical Oncology [facility] [phone]  10/13/2013, 2:12 PM     ATTENDING'S ATTESTATION:  I personally reviewed patient's chart, examined patient myself, formulated the treatment plan as followed.    Overall patient has done well. She has been tolerating her neoadjuvant chemotherapy very well she is receiving Gemzar carboplatinum. Today is her final cycle of this. She has had her MRI formed and the results are remarkable. She is also been seen by Dr. Dwain Sarna.  See her back in one week's time for followup. After which she will proceed with her surgery. Patient is planning on having bilateral mastectomies. She understands that her genetic testing was negative.  Drue Second, MD Medical/Oncology [facility] [phone] (beeper) [phone] (Office)  10/18/2013, 7:01 PM

## 2013-10-13 ENCOUNTER — Ambulatory Visit (HOSPITAL_BASED_OUTPATIENT_CLINIC_OR_DEPARTMENT_OTHER): Payer: No Typology Code available for payment source

## 2013-10-13 VITALS — BP 120/75 | HR 101 | Temp 98.4°F

## 2013-10-13 DIAGNOSIS — C50219 Malignant neoplasm of upper-inner quadrant of unspecified female breast: Secondary | ICD-10-CM

## 2013-10-13 DIAGNOSIS — Z5189 Encounter for other specified aftercare: Secondary | ICD-10-CM

## 2013-10-13 DIAGNOSIS — C50212 Malignant neoplasm of upper-inner quadrant of left female breast: Secondary | ICD-10-CM

## 2013-10-13 MED ORDER — PEGFILGRASTIM INJECTION 6 MG/0.6ML
6.0000 mg | Freq: Once | SUBCUTANEOUS | Status: AC
  Administered 2013-10-13: 6 mg via SUBCUTANEOUS

## 2013-10-19 ENCOUNTER — Encounter: Payer: Self-pay | Admitting: Adult Health

## 2013-10-19 ENCOUNTER — Other Ambulatory Visit (HOSPITAL_BASED_OUTPATIENT_CLINIC_OR_DEPARTMENT_OTHER): Payer: PRIVATE HEALTH INSURANCE | Admitting: Lab

## 2013-10-19 ENCOUNTER — Ambulatory Visit (HOSPITAL_BASED_OUTPATIENT_CLINIC_OR_DEPARTMENT_OTHER): Payer: PRIVATE HEALTH INSURANCE | Admitting: Adult Health

## 2013-10-19 ENCOUNTER — Encounter (INDEPENDENT_AMBULATORY_CARE_PROVIDER_SITE_OTHER): Payer: Self-pay | Admitting: General Surgery

## 2013-10-19 ENCOUNTER — Telehealth: Payer: Self-pay | Admitting: Oncology

## 2013-10-19 ENCOUNTER — Ambulatory Visit (INDEPENDENT_AMBULATORY_CARE_PROVIDER_SITE_OTHER): Payer: PRIVATE HEALTH INSURANCE | Admitting: General Surgery

## 2013-10-19 VITALS — BP 119/83 | HR 123 | Temp 98.4°F | Resp 18 | Ht 65.0 in | Wt 241.3 lb

## 2013-10-19 VITALS — BP 128/88 | HR 80 | Temp 97.4°F | Resp 18 | Ht 65.5 in | Wt 240.4 lb

## 2013-10-19 DIAGNOSIS — C50219 Malignant neoplasm of upper-inner quadrant of unspecified female breast: Secondary | ICD-10-CM

## 2013-10-19 DIAGNOSIS — C50212 Malignant neoplasm of upper-inner quadrant of left female breast: Secondary | ICD-10-CM

## 2013-10-19 DIAGNOSIS — R112 Nausea with vomiting, unspecified: Secondary | ICD-10-CM

## 2013-10-19 DIAGNOSIS — Z170 Estrogen receptor positive status [ER+]: Secondary | ICD-10-CM

## 2013-10-19 LAB — CBC WITH DIFFERENTIAL/PLATELET
Eosinophils Absolute: 0.1 10*3/uL (ref 0.0–0.5)
MCHC: 32.3 g/dL (ref 31.5–36.0)
MCV: 97.4 fL (ref 79.5–101.0)
MONO%: 1.8 % (ref 0.0–14.0)
NEUT#: 17.7 10*3/uL — ABNORMAL HIGH (ref 1.5–6.5)
Platelets: 280 10*3/uL (ref 145–400)
RBC: 3.3 10*6/uL — ABNORMAL LOW (ref 3.70–5.45)
RDW: 18.5 % — ABNORMAL HIGH (ref 11.2–14.5)
WBC: 20.6 10*3/uL — ABNORMAL HIGH (ref 3.9–10.3)

## 2013-10-19 LAB — COMPREHENSIVE METABOLIC PANEL (CC13)
AST: 39 U/L — ABNORMAL HIGH (ref 5–34)
Albumin: 3.2 g/dL — ABNORMAL LOW (ref 3.5–5.0)
Alkaline Phosphatase: 201 U/L — ABNORMAL HIGH (ref 40–150)
CO2: 24 mEq/L (ref 22–29)
Glucose: 80 mg/dl (ref 70–140)
Potassium: 3.3 mEq/L — ABNORMAL LOW (ref 3.5–5.1)
Sodium: 139 mEq/L (ref 136–145)
Total Protein: 7.6 g/dL (ref 6.4–8.3)

## 2013-10-19 NOTE — Progress Notes (Signed)
Patient ID: Erika [name], female   DOB: 05-18-1971, 41 y.o.   MRN: [id number]  Chief Complaint  Patient presents with  . Pre-op Exam    discuss sx    HPI Erika [name] is a 42 y.o. female.   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 She eventually was seen in an urgent care where this mass was thought to be an abscess. Apparently there was an attempt to excise or drain this that was not successful and she was referred to our office for evaluation. She was seen in our urgent office and underwent punch biopsy of her skin and the mass. She was then also sent for mm/us. 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 It is her2 not amplified, er pos at 15, pr neg and Ki approaches 100%. She has also undergone mm with at least 10 cm upper inner left breast tumor with mild overyling skin thickening. There is also a 3x10 mm cluster of calcs near there also. There is indeterminate 7 mm level 1 left axillary node. No mm evidence of right breast malignancy. The MR showed large mass with another small area on left that is negative on prior biopsy. She has undergone primary chemotherapy now with a good result.  I saw her last week to discuss surgery and to see if she was able to enter neoadjuvant/node study.  She returns today after considering whether she wanted bilateral mastectomies vs left mastectomy.  No interval change.  Past Medical History  Diagnosis Date  . Breast cancer     Invasive High Grade Carcinoma  . Headache(784.0)     Hx: of Migraines  . Status post chemotherapy     Adriamycin and Cytoxan    Past Surgical History  Procedure Laterality Date  . Tubal ligation    . Portacath placement Right 05/22/2013    Procedure: INSERTION PORT-A-CATH;  Surgeon: Emelia Loron, MD;  Location: [facility] OR;  Service: General;  Laterality: Right;  . Left breast needle core biopsy Left 05/21/13  . Left breast needle core  biopsy Left  06/22/13    Family History  Problem Relation Age of Onset  . Hypertension Mother   . Diabetes Father 69  . Stomach cancer Maternal Aunt   . Hypertension Maternal Grandmother   . Hypertension Maternal Grandfather   . Diabetes Paternal Grandmother   . Diabetes Paternal Grandfather   . Diabetes Paternal Uncle   . Diabetes Paternal Uncle   . Diabetes Paternal Uncle     Social History History  Substance Use Topics  . Smoking status: Never Smoker   . Smokeless tobacco: Never Used  . Alcohol Use: No    No Known Allergies  Current Outpatient Prescriptions  Medication Sig Dispense Refill  . cyclobenzaprine (FLEXERIL) 5 MG tablet Take 1 tablet (5 mg total) by mouth 3 (three) times daily as needed for muscle spasms.  30 tablet  0  . dexamethasone (DECADRON) 4 MG tablet Take 2 tablets (8 mg total) by mouth See admin instructions. Take daily for 3 days starting day after chemo  30 tablet  0  . enoxaparin (LOVENOX) 150 MG/ML injection Inject 1 mL (150 mg total) into the skin daily.  30 Syringe  6  . fentaNYL (DURAGESIC - DOSED MCG/HR) 75 MCG/HR Place 1 patch (75 mcg total) onto the skin every 3 (three) days.  10 patch  0  . ibuprofen (ADVIL,MOTRIN) 200 MG tablet Take  600-800 mg by mouth every 4 (four) hours as needed for pain.       Marland Kitchen lidocaine-prilocaine (EMLA) cream Apply 1 application topically every 14 (fourteen) days. Every other Friday prior to chemo      . LORazepam (ATIVAN) 0.5 MG tablet Take 1 tablet (0.5 mg total) by mouth every 6 (six) hours as needed for anxiety.  30 tablet  0  . oxyCODONE (OXY IR/ROXICODONE) 5 MG immediate release tablet Take 1 tablet (5 mg total) by mouth every 4 (four) hours as needed for pain.  60 tablet  0  . potassium chloride SA (K-DUR,KLOR-CON) 20 MEQ tablet Take 20 mEq by mouth daily.      Marland Kitchen zolpidem (AMBIEN) 5 MG tablet Take 1 tablet (5 mg total) by mouth at bedtime as needed for sleep.  30 tablet  0   No current facility-administered medications for  this visit.    Review of Systems Review of Systems  Blood pressure 128/88, pulse 80, temperature 97.4 F (36.3 C), temperature source Temporal, resp. rate 18, height 5' 5.5" (1.664 m), weight 240 lb 6.4 oz (109.045 kg), last menstrual period 05/30/2013.   Physical Exam   Constitutional: She appears well-developed and well-nourished.  Eyes: No scleral icterus.  Neck: Neck supple.  Cardiovascular: Normal rate, regular rhythm and normal heart sounds.  Pulmonary/Chest: Effort normal and breath sounds normal. Right breast exhibits no inverted nipple, no mass, no nipple discharge, no skin change and no tenderness. Left breast exhibits mass.    Lymphadenopathy:  She has no cervical adenopathy.  She has no axillary adenopathy.  Right: No supraclavicular adenopathy present.  Left: No supraclavicular adenopathy present.     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 Due to that I recommended her to standard treatment for a stage IIIB cancer preoperatively which will be a left modified radical mastectomy. We discussed the performance of that procedure as well as the risks including bleeding, infection, wound dehiscence, flap death, arm swelling or lymphedema over the long-term which a quarter or risk of up to 40% given the fact that she will need radiation, chronic shoulder pain and numbness around her shoulder. She understands all these risks. She understands that her arm and shoulder may not be normal after the surgery as well as radiation may certainly impact her ability to do her job. We discussed a sentinel node biopsy and the role of that for her disease but I don't think it is proven that that would be an acceptable procedure for her disease state. I will have her see physical therapy prior to surgery to see if we can work on prevention. We also discussed a bilateral mastectomy I  think he would be a wiser choice right down to just proceed with taking care of the left side and leaving the right side for later date when she decides if she can undergo reconstruction on this side.        Erika [name] 10/19/2013, 12:12 PM

## 2013-10-19 NOTE — Addendum Note (Signed)
Addended by: Ethlyn Gallery on: 10/19/2013 02:43 PM   Modules accepted: Orders

## 2013-10-19 NOTE — Telephone Encounter (Signed)
, °

## 2013-10-19 NOTE — Progress Notes (Signed)
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    PRIOR THERAPY: 1. 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 2 months ago this began getting larger rapidly. She was seen in an urgent care where this mass was thought to be an abscess. Apparently there was an attempt to excise or drain this that was not successful and she was referred to Dr. Dwain Sarna for evaluation. She underwent punch biopsy on 04/23/13 of her skin and the mass. She was then also sent for mm/us. The biopsy shows invasive high grade carcinoma that appears to be breast. It is ER 14%, PR negative, HER-2/neu negative with a Ki-67 that approaches 100%. She has also undergone mm with at least 10 cm upper inner left breast tumor with mild overyling skin thickening. There is also a 3x10 mm cluster of calcs near there also. There is indeterminate 7 mm level 1 left axillary node, she underwent a lymph node biopsy that was positive for disease. No mm evidence of right breast malignancy.  An MRI of the breasts was done on 05/22/13 and showed a 14 cm mass in the left breast protruding from the skin and also a 2.5 cm level II axillary node.   2.  Patient evaluated and began on neoadjuvant chemotherapy.  Adriamycin and Cytoxan began on 05/25/13.  She completed 4 cycles with Neulasta support.   Taxol carbo began on 8/8. 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  #3 staging PET/CT performed on 06/13/2013 revealed no evidence of distant disease.  #4 anemia of iron deficiency  CURRENT THERAPY: gemzar/Carbo cycle 6 day 8  INTERVAL HISTORY: Erika [name] 42 y.o. female returns for evaluation after receiving her sixth and final cycle of neoadjuvant gemzar/carbo.    She had 3 days of vomiting this  past week, but otherwise denies fevers, chills, constipation, diarrhea.  Her numbness is stable, she has retained her motor function.  Otherwise, a 10 point ROS is neg.   MEDICAL HISTORY: Past Medical History  Diagnosis Date  . Breast cancer     Invasive High Grade Carcinoma  . Headache(784.0)     Hx: of Migraines  . Status post chemotherapy     Adriamycin and Cytoxan    ALLERGIES:  has No Known Allergies.  MEDICATIONS:  Current Outpatient Prescriptions  Medication Sig Dispense Refill  . cyclobenzaprine (FLEXERIL) 5 MG tablet Take 1 tablet (5 mg total) by mouth 3 (three) times daily as needed for muscle spasms.  30 tablet  0  . dexamethasone (DECADRON) 4 MG tablet Take 2 tablets (8 mg total) by mouth See admin instructions. Take daily for 3 days starting day after chemo  30 tablet  0  . enoxaparin (LOVENOX) 150 MG/ML injection Inject 1 mL (150 mg total) into the skin daily.  30 Syringe  6  . fentaNYL (DURAGESIC - DOSED MCG/HR) 75 MCG/HR Place 1 patch (75 mcg total) onto the skin every 3 (three) days.  10 patch  0  . ibuprofen (ADVIL,MOTRIN) 200 MG tablet Take 600-800 mg by mouth every 4 (four) hours as needed for pain.       Marland Kitchen lidocaine-prilocaine (EMLA) cream Apply 1 application topically every 14 (fourteen) days. Every other Friday prior to chemo      .  LORazepam (ATIVAN) 0.5 MG tablet Take 1 tablet (0.5 mg total) by mouth every 6 (six) hours as needed for anxiety.  30 tablet  0  . oxyCODONE (OXY IR/ROXICODONE) 5 MG immediate release tablet Take 1 tablet (5 mg total) by mouth every 4 (four) hours as needed for pain.  60 tablet  0  . potassium chloride SA (K-DUR,KLOR-CON) 20 MEQ tablet Take 20 mEq by mouth daily.      Marland Kitchen zolpidem (AMBIEN) 5 MG tablet Take 1 tablet (5 mg total) by mouth at bedtime as needed for sleep.  30 tablet  0   No current facility-administered medications for this visit.    SURGICAL HISTORY:  Past Surgical History  Procedure Laterality Date  . Tubal ligation     . Portacath placement Right 05/22/2013    Procedure: INSERTION PORT-A-CATH;  Surgeon: Emelia Loron, MD;  Location: [facility];  Service: General;  Laterality: Right;  . Left breast needle core biopsy Left 05/21/13  . Left breast needle core  biopsy Left 06/22/13    REVIEW OF SYSTEMS:  A 10 point review of systems was conducted and is otherwise negative except for what is noted above.    PHYSICAL EXAMINATION: Blood pressure 119/83, pulse 123, temperature 98.4 F (36.9 C), temperature source Oral, resp. rate 18, height 5\' 5"  (1.651 m), weight 241 lb 4.8 oz (109.453 kg), last menstrual period 05/30/2013. Body mass index is 40.15 kg/(m^2). General: Patient is a well appearing female in no acute distress HEENT: PERRLA, sclerae anicteric no conjunctival pallor, MMM Neck: supple, no palpable adenopathy Lungs: clear to auscultation bilaterally, no wheezes, rhonchi, or rales Cardiovascular: regular rate rhythm, S1, S2, no murmurs, rubs or gallops Abdomen: Soft, non-tender, non-distended, normoactive bowel sounds, no HSM Extremities: warm and well perfused, no clubbing, cyanosis, or edema Skin: No rashes or lesions Neuro: Non-focal Breasts: left breast mass is much smaller, now hardly palpable, skin change from where mass ulcerated is evident with mild thickness, however lesion is much improved.   ECOG PERFORMANCE STATUS: 1 - Symptomatic but completely ambulatory  LABORATORY DATA: Lab Results  Component Value Date   WBC 20.6* 10/19/2013   HGB 10.4* 10/19/2013   HCT 32.2* 10/19/2013   MCV 97.4 10/19/2013   PLT 280 10/19/2013      Chemistry      Component Value Date/Time   NA 139 10/19/2013 0908   NA 139 05/18/2013 1205   K 3.3* 10/19/2013 0908   K 3.8 05/18/2013 1205   CL 102 06/01/2013 1500   CL 103 05/18/2013 1205   CO2 24 10/19/2013 0908   CO2 28 05/18/2013 1205   BUN 6.3* 10/19/2013 0908   BUN 8 05/18/2013 1205   CREATININE 0.9 10/19/2013 0908   CREATININE 0.84 05/18/2013 1205      Component Value  Date/Time   CALCIUM 10.2 10/19/2013 0908   CALCIUM 9.1 05/18/2013 1205   ALKPHOS 201* 10/19/2013 0908   ALKPHOS 111 05/18/2013 1205   AST 39* 10/19/2013 0908   AST 18 05/18/2013 1205   ALT 67* 10/19/2013 0908   ALT 16 05/18/2013 1205   BILITOT <0.20 10/19/2013 0908   BILITOT 0.2* 05/18/2013 1205       RADIOGRAPHIC STUDIES:  Chest 2 View  05/18/2013   *RADIOLOGY REPORT*  Clinical Data: Breast carcinoma  CHEST - 2 VIEW  Comparison: None.  Findings: Probable left breast mass. Lungs clear.  Heart size and pulmonary vascularity normal.  No effusion.  Visualized bones unremarkable.  IMPRESSION: No acute disease  Original Report Authenticated By: D. Andria Rhein, MD   US Breast Left  05/08/2013   *RADIOLOGY REPORT*  Clinical Data:  42 year old female with large left breast mass - recent punch biopsy demonstrating high-grade carcinoma.  DIGITAL DIAGNOSTIC BILATERAL MAMMOGRAM WITH CAD AND LEFT BREAST ULTRASOUND:  Comparison:  None.  Findings:  ACR Breast Density Category 2: There is a scattered fibroglandular pattern.  A large irregular mass within the upper inner posterior left breast is identified - measuring at least 10 cm. Mild overlying skin thickening is identified compatible with dermal invasion. A 3 x 10 mm cluster of slightly heterogeneous left breast calcifications approximate 4 cm anterior and lateral to this mass is identified.  There is no evidence of suspicious mass, distortion or worrisome calcifications within the right breast. Mammographic images were processed with CAD.  On physical exam, a very large protuberant mass within the upper inner left breast is identified.  Ultrasound is performed, showing a large heterogeneous mass within the upper inner left breast at the 10-11 o'clock position, measuring at least 6.5 cm, but evaluation is limited by transducer skin area and protuberant portion of the mass. A 7 mm level I left axillary lymph node is identified with slightly echogenic rim - suspicious.   IMPRESSION: Large biopsy-proven carcinoma within the upper inner left breast, measuring at least 10 cm mammographically. Mild overlying skin thickening compatible with dermal invasion.  3 x 10 mm cluster of indeterminate left breast calcifications 4 cm anterior and lateral to the biopsy-proven neoplasm.  This may represent DCIS and consider tissue sampling.  Indeterminate 7 mm level I left axillary lymph node - metastatic disease not excluded.  No mammographic evidence of right breast malignancy.  BI-RADS CATEGORY 6:  Known biopsy-proven malignancy - appropriate action should be taken.  RECOMMENDATION: Treatment plan.  Consider biopsies of the indeterminate left axillary lymph node and left breast calcifications as clinically indicated.  Consider bilateral breast MRI as clinically indicated.  I have discussed the findings and recommendations with the patient. Results were also provided in writing at the conclusion of the visit.   Original Report Authenticated By: Harmon Pier, M.D.   Mr Breast Bilateral W Wo Contrast  05/22/2013   *RADIOLOGY REPORT*  Clinical Data: large invasive carcinoma left upper inner quadrant with dermal involvement  BILATERAL BREAST MRI WITH AND WITHOUT CONTRAST  Technique: Multiplanar, multisequence MR images of both breasts were obtained prior to and following the intravenous administration of 20ml of Multihance.  Three dimensional images were evaluated at the independent DynaCad workstation.  Comparison:  05/08/13  Findings: There is moderate background parenchymal enhancement. The right breast and axilla are negative.  On the left, there is a large irregular mass with irregular borders in the upper inner quadrant, demonstrating heterogenous internal enhancement.  The mass measures 14 (AP x 7.5 (lateral) x 8 (craniocaudal)cm.  It causes protrusion of the skin of the breast in the 10 - 11 o'clock position, and it is associated with edematous change in the skin of the left breast, primarily  in the upper inner quadrant but also in the periareolar in more infra- areolar left breast.  The mass extends through pectoralis major musculature posteriorly, with enhancing mass extending all the way to the posterior edge of the muscle.  In the 2 o'clock position posteriorly there is a 9mm oval enhancing mass with a mixture of plateau and washout kinetics; it is an indeterminate finding but shows the suggestion of a fatty hilum, and it may be an  intramammary lymph node.  In the left axilla, there is an enlarged level II lymph node demonstrating hypervascularity and cortical thickening.  It measures 2.5cm in greatest dimension, and the appearance is concerning for metastasis.  There are no other abnormalities on the left.  IMPRESSION: Large left breast malignancy associated with involvement of the dermis and the chest wall, as well as with probable axillary lymph node metastasis.  There is an indeterminate 9mm mass in the 2 o'clock position, that may be an intramammary lymph node.  BI-RADS CATEGORY 6:  Known biopsy-proven malignancy - appropriate action should be taken.  THREE-DIMENSIONAL MR IMAGE RENDERING ON INDEPENDENT WORKSTATION:  Three-dimensional MR images were rendered by post-processing of the original MR data on an independent workstation.  The three- dimensional MR images were interpreted, and findings were reported in the accompanying complete MRI report for this study.   Original Report Authenticated By: Esperanza Heir, M.D.   Dg Chest Port 1 View  05/22/2013   *RADIOLOGY REPORT*  Clinical Data: Port-A-Cath placement.  PORTABLE CHEST - 1 VIEW  Comparison: 05/18/2013  Findings: Right-sided power injectable Port-A-Cath noted with distal tip projecting over the SVC.  Low lung volumes are present, causing crowding of the pulmonary vasculature.  No pneumothorax noted.  Bilateral midlung subsegmental atelectasis noted.  Borderline cardiomegaly.  IMPRESSION:  1.  Power injectable catheter tip:  SVC.  No  pneumothorax.   Original Report Authenticated By: Gaylyn Rong, M.D.   Mm Digital Diagnostic Bilat  05/08/2013   *RADIOLOGY REPORT*  Clinical Data:  42 year old female with large left breast mass - recent punch biopsy demonstrating high-grade carcinoma.  DIGITAL DIAGNOSTIC BILATERAL MAMMOGRAM WITH CAD AND LEFT BREAST ULTRASOUND:  Comparison:  None.  Findings:  ACR Breast Density Category 2: There is a scattered fibroglandular pattern.  A large irregular mass within the upper inner posterior left breast is identified - measuring at least 10 cm. Mild overlying skin thickening is identified compatible with dermal invasion. A 3 x 10 mm cluster of slightly heterogeneous left breast calcifications approximate 4 cm anterior and lateral to this mass is identified.  There is no evidence of suspicious mass, distortion or worrisome calcifications within the right breast. Mammographic images were processed with CAD.  On physical exam, a very large protuberant mass within the upper inner left breast is identified.  Ultrasound is performed, showing a large heterogeneous mass within the upper inner left breast at the 10-11 o'clock position, measuring at least 6.5 cm, but evaluation is limited by transducer skin area and protuberant portion of the mass. A 7 mm level I left axillary lymph node is identified with slightly echogenic rim - suspicious.  IMPRESSION: Large biopsy-proven carcinoma within the upper inner left breast, measuring at least 10 cm mammographically. Mild overlying skin thickening compatible with dermal invasion.  3 x 10 mm cluster of indeterminate left breast calcifications 4 cm anterior and lateral to the biopsy-proven neoplasm.  This may represent DCIS and consider tissue sampling.  Indeterminate 7 mm level I left axillary lymph node - metastatic disease not excluded.  No mammographic evidence of right breast malignancy.  BI-RADS CATEGORY 6:  Known biopsy-proven malignancy - appropriate action should be  taken.  RECOMMENDATION: Treatment plan.  Consider biopsies of the indeterminate left axillary lymph node and left breast calcifications as clinically indicated.  Consider bilateral breast MRI as clinically indicated.  I have discussed the findings and recommendations with the patient. Results were also provided in writing at the conclusion of the visit.  Original Report Authenticated By: Harmon Pier, M.D.   Dg Fluoro Guide Cv Line-no Report  05/22/2013   CLINICAL DATA: surgery   FLOURO GUIDE CV LINE  Fluoroscopy was utilized by the requesting physician.  No radiographic  interpretation.    [place] Lt Breast Bx W Loc Dev 1st Lesion Img Bx Spec US Guide  05/22/2013   **ADDENDUM** CREATED: 05/22/2013 11:12:58  The patient was called by myself, Dr. Micheline Maze today.  The patient states she is doing well since her left axillary node biopsy without problems at the biopsy site.  The patient was given the results of her biopsy which was compatible with metastatic invasive mammary carcinoma.  This is concordant with imaging findings and patient's known history of recent positive biopsy of a large inner upper left breast mass demonstrating invasive mammary carcinoma. The patient had her breast MRI this morning and will receive Port-A- Cath placement by Dr. Dwain Sarna this afternoon.  Recommend follow- up pending MRI results and per treatment plan.  **END ADDENDUM** SIGNED BY: Melton Alar. Micheline Maze, M.D.  05/21/2013   *RADIOLOGY REPORT*  Clinical Data:  New diagnosis of invasive mammary carcinoma in the left upper inner quadrant.  Abnormal lymph node noted on ultrasound dated 05/08/2013.  ULTRASOUND GUIDED CORE BIOPSY OF THE LEFT AXILLA  The patient and I discussed the procedure of ultrasound-guided biopsy, including benefits and alternatives.  We discussed the high likelihood of a successful procedure. We discussed the risks of the procedure, including infection, bleeding, tissue injury, clip migration, and inadequate sampling.  Written  informed consent was given. Appropriate time-out was performed.  Using sterile technique, 2% lidocaine, ultrasound guidance, and a 14 gauge automated biopsy device, biopsy was performed of the abnormal left axillary lymph node using a lateromedial approach. At the conclusion of the procedure, a dumbbell shaped tissue marker clip was deployed into the biopsy cavity. Post biopsy mammogram was not performed.  IMPRESSION: Ultrasound guided biopsy of an abnormal left axillary lymph node. No apparent complications.  Original Report Authenticated By: Cain Saupe, M.D.    ASSESSMENT:   1. Patient is a 42 year old female with high grade invasive breast carcinoma ER 14%, PR negative, HER-2/neu negative.  She was evaluated by Dr. Dwain Sarna.      #2 patient had CT and PET scan performed. She has no evidence of metastatic disease distantly.  #3 she was begun on neoadjuvant chemotherapy consisting of Adriamycin Cytoxan given Q2 weekly beginning on 06/08/2013. She completed this regimen, and went on to receive Taxol/Carbo weekly. She received 1 cycle of this and it was discontinued due to progressive neuropathy.  She started Gemzar/Carbo on 8/22. She will receive 6 cycles of this.  Currently on cycle 6 day 8.    #4 anemia due to menorrhagia. She is now status post feraheme given on 06/13/2013.  #5 Pain controlled on Percocet and Fentanyl Patch. We did receive a letter from her insurance company due that reported she has had three providers prescribing her narcotics since April.  This was discussed with Mrs. Bells and she will only receive pain medication from myself or Dr. Welton Flakes.   PLAN:  #1  Mrs. Tibbitts is doing well today.  She has tolerated her chemotherapy well.    #2 Should she continue to have nausea and vomiting through the weekend, she will call me on Monday.  She is eating bojangles right now during this appointment.    #3 She will f/u with Dr. Dwain Sarna today for surgical planning.  I will  plan on  seeing her back the week of December 15.    All questions were answered. The patient knows to call the clinic with any problems, questions or concerns. We can certainly see the patient much sooner if necessary.  I spent 25 minutes counseling the patient face to face. The total time spent in the appointment was 30 minutes.  Illa Level, NP Medical Oncology [facility] [phone]  10/20/2013, 10:44 AM

## 2013-10-19 NOTE — Patient Instructions (Signed)
Doing well.  Labs are stable.  Should you have any further nausea/vomiting this weekend, call us on Monday.    We will see you back the week of 11/26/13.  Please call us if you have any questions or concerns.

## 2013-10-25 ENCOUNTER — Ambulatory Visit: Payer: No Typology Code available for payment source | Attending: General Surgery | Admitting: Physical Therapy

## 2013-10-25 DIAGNOSIS — IMO0001 Reserved for inherently not codable concepts without codable children: Secondary | ICD-10-CM | POA: Insufficient documentation

## 2013-10-25 DIAGNOSIS — M24519 Contracture, unspecified shoulder: Secondary | ICD-10-CM | POA: Insufficient documentation

## 2013-10-25 DIAGNOSIS — M25519 Pain in unspecified shoulder: Secondary | ICD-10-CM | POA: Insufficient documentation

## 2013-10-26 ENCOUNTER — Other Ambulatory Visit: Payer: Self-pay | Admitting: Oncology

## 2013-10-26 DIAGNOSIS — C50212 Malignant neoplasm of upper-inner quadrant of left female breast: Secondary | ICD-10-CM

## 2013-10-26 MED ORDER — OXYCODONE HCL 5 MG PO TABS: 5.0000 mg | ORAL_TABLET | ORAL | Status: DC | PRN

## 2013-10-26 NOTE — Telephone Encounter (Signed)
Pt called with request for refill on flexeril and oxycodone. Per NP, pt may have refills until she has surgery then she will need to be seen at pain clinic for any additional refills. Notified pt Flexeril sent to [facility] and pt will need to p/u rx for pain med at [facility] prior to 4pm today. Pt verbalized understanding, no further concern.

## 2013-10-30 ENCOUNTER — Ambulatory Visit: Payer: No Typology Code available for payment source | Admitting: Physical Therapy

## 2013-11-01 ENCOUNTER — Ambulatory Visit: Payer: No Typology Code available for payment source | Admitting: Physical Therapy

## 2013-11-05 ENCOUNTER — Encounter (HOSPITAL_COMMUNITY): Payer: Self-pay | Admitting: Pharmacy Technician

## 2013-11-05 ENCOUNTER — Ambulatory Visit: Payer: No Typology Code available for payment source | Admitting: Physical Therapy

## 2013-11-06 ENCOUNTER — Encounter (HOSPITAL_COMMUNITY)
Admission: RE | Admit: 2013-11-06 | Discharge: 2013-11-06 | Disposition: A | Discharge status: Home / Self Care | Payer: No Typology Code available for payment source | Source: Ambulatory Visit | Attending: General Surgery | Admitting: General Surgery

## 2013-11-06 ENCOUNTER — Encounter (HOSPITAL_COMMUNITY): Payer: Self-pay

## 2013-11-06 ENCOUNTER — Telehealth (INDEPENDENT_AMBULATORY_CARE_PROVIDER_SITE_OTHER): Payer: Self-pay

## 2013-11-06 DIAGNOSIS — Z01818 Encounter for other preprocedural examination: Secondary | ICD-10-CM | POA: Insufficient documentation

## 2013-11-06 DIAGNOSIS — Z01812 Encounter for preprocedural laboratory examination: Secondary | ICD-10-CM | POA: Insufficient documentation

## 2013-11-06 DIAGNOSIS — Z01810 Encounter for preprocedural cardiovascular examination: Secondary | ICD-10-CM | POA: Insufficient documentation

## 2013-11-06 HISTORY — DX: Anxiety disorder, unspecified: F41.9

## 2013-11-06 HISTORY — DX: Acute embolism and thrombosis of unspecified deep veins of unspecified lower extremity: I82.409

## 2013-11-06 HISTORY — DX: Polyneuropathy, unspecified: G62.9

## 2013-11-06 LAB — BASIC METABOLIC PANEL
BUN: 13 mg/dL (ref 6–23)
CO2: 23 mEq/L (ref 19–32)
Calcium: 10.1 mg/dL (ref 8.4–10.5)
Creatinine, Ser: 0.96 mg/dL (ref 0.50–1.10)
GFR calc non Af Amer: 71 mL/min — ABNORMAL LOW (ref >90)
Glucose, Bld: 98 mg/dL (ref 70–99)
Sodium: 136 mEq/L (ref 135–145)

## 2013-11-06 LAB — CBC WITH DIFFERENTIAL/PLATELET
Basophils Absolute: 0 10*3/uL (ref 0.0–0.1)
Basophils Relative: 0 % (ref 0–1)
Eosinophils Absolute: 0.2 10*3/uL (ref 0.0–0.7)
Eosinophils Relative: 3 % (ref 0–5)
HCT: 36.1 % (ref 36.0–46.0)
MCH: 31.8 pg (ref 26.0–34.0)
MCHC: 33.5 g/dL (ref 30.0–36.0)
MCV: 95 fL (ref 78.0–100.0)
Neutrophils Relative %: 46 % (ref 43–77)
Platelets: 273 10*3/uL (ref 150–400)
RDW: 16.1 % — ABNORMAL HIGH (ref 11.5–15.5)

## 2013-11-06 LAB — HCG, SERUM, QUALITATIVE: Preg, Serum: NEGATIVE

## 2013-11-06 NOTE — Telephone Encounter (Signed)
Message copied by Ethlyn Gallery on Tue Nov 06, 2013 11:06 AM ------      Message from: Dwain Sarna, MATTHEW      Created: Mon Nov 05, 2013  8:35 AM       Elease Hashimoto      Could you please make sure she stops lovenox 24 hours prior to surgery.      MW      ----- Message -----         From: Illa Level, NP         Sent: 10/19/2013   2:24 PM           To: Emelia Loron, MD            Sounds good.  She had the right arm DVT near her port, that we were able to treat and not remove the port.  Your surgical plan sounds great.  Let me know if you need any more info.  Her last ultrasound showed that the clot was resolving.              Thanks,       L      ----- Message -----         From: Emelia Loron, MD         Sent: 10/19/2013  12:33 PM           To: Illa Level, NP            Mardella Layman      Mrs Armentrout is on lovenox.  I forgot about it when she was here.  Remind me what she is on it for.  I am going to schedule her in about 3 weeks from now for left mrm.  She doesn't qualify due to stage IIIb for study to do sentinel node.  Dont think she has another option now and reconstruction probably not a great idea immediately for her.  She wanted bilateral mastectomies but I think taking care of cancer side for now is probably best for her and if does well and desires reconstruction later I can do prophylactic at same time.      Thanks,      [employer]             ------

## 2013-11-06 NOTE — Telephone Encounter (Signed)
Called pt to remind her that she needed to hold her Lovenox 24hrs before surgery. The pt is aware.

## 2013-11-06 NOTE — Patient Instructions (Signed)
YOUR SURGERY IS SCHEDULED AT [facility]  ON:  Thursday  12/4  REPORT TO  SHORT STAY CENTER AT:  12:15 PM      PHONE # FOR SHORT STAY IS [phone]  DO NOT EAT  ANYTHING AFTER MIDNIGHT THE NIGHT BEFORE YOUR SURGERY.  NO FOOD, NO CHEWING GUM, NO MINTS, NO CANDIES, NO CHEWING TOBACCO. YOU MAY HAVE CLEAR LIQUIDS TO DRINK FROM MIDNIGHT UNTIL 8:15 AM DAY OF YOUR SURGERY - LIKE WATER, SODA.  NOTHING TO DRINK AFTER 8:15 AM THE AM OF SURGERY.  PLEASE TAKE THE FOLLOWING MEDICATIONS THE AM OF YOUR SURGERY WITH A FEW SIPS OF WATER:  ATIVAN AND OXYCODONE IF NEEDED FOR ANXIETY / PAIN.  WEAR YOUR FENTANYL PATCH.   DO NOT BRING VALUABLES, MONEY, CREDIT CARDS.  DO NOT WEAR JEWELRY, MAKE-UP, NAIL POLISH AND NO METAL PINS OR CLIPS IN YOUR HAIR. CONTACT LENS, DENTURES / PARTIALS, GLASSES SHOULD NOT BE WORN TO SURGERY AND IN MOST CASES-HEARING AIDS WILL NEED TO BE REMOVED.  BRING YOUR GLASSES CASE, ANY EQUIPMENT NEEDED FOR YOUR CONTACT LENS. FOR PATIENTS ADMITTED TO THE HOSPITAL--CHECK OUT TIME THE DAY OF DISCHARGE IS 11:00 AM.  ALL INPATIENT ROOMS ARE PRIVATE - WITH BATHROOM, TELEPHONE, TELEVISION AND WIFI INTERNET.                                                    PLEASE READ OVER ANY  FACT SHEETS THAT YOU WERE GIVEN: MRSA INFORMATION, BLOOD TRANSFUSION INFORMATION, INCENTIVE SPIROMETER INFORMATION.  FAILURE TO FOLLOW THESE INSTRUCTIONS MAY RESULT IN THE CANCELLATION OF YOUR SURGERY. PLEASE BE AWARE THAT YOU MAY NEED ADDITIONAL BLOOD DRAWN DAY OF YOUR SURGERY  PATIENT SIGNATURE_________________________________

## 2013-11-06 NOTE — Pre-Procedure Instructions (Signed)
EKG WAS DONE TODAY PREOP - HX OF CHEMOTHERAPY.

## 2013-11-09 ENCOUNTER — Other Ambulatory Visit: Payer: Self-pay | Admitting: Oncology

## 2013-11-09 DIAGNOSIS — C50212 Malignant neoplasm of upper-inner quadrant of left female breast: Secondary | ICD-10-CM

## 2013-11-13 ENCOUNTER — Ambulatory Visit (HOSPITAL_BASED_OUTPATIENT_CLINIC_OR_DEPARTMENT_OTHER): Payer: No Typology Code available for payment source

## 2013-11-13 ENCOUNTER — Ambulatory Visit: Payer: PRIVATE HEALTH INSURANCE | Attending: General Surgery

## 2013-11-13 ENCOUNTER — Other Ambulatory Visit: Payer: Self-pay | Admitting: Emergency Medicine

## 2013-11-13 DIAGNOSIS — M24519 Contracture, unspecified shoulder: Secondary | ICD-10-CM | POA: Insufficient documentation

## 2013-11-13 DIAGNOSIS — Z5111 Encounter for antineoplastic chemotherapy: Secondary | ICD-10-CM

## 2013-11-13 DIAGNOSIS — C50219 Malignant neoplasm of upper-inner quadrant of unspecified female breast: Secondary | ICD-10-CM

## 2013-11-13 DIAGNOSIS — IMO0001 Reserved for inherently not codable concepts without codable children: Secondary | ICD-10-CM | POA: Insufficient documentation

## 2013-11-13 DIAGNOSIS — M25519 Pain in unspecified shoulder: Secondary | ICD-10-CM | POA: Insufficient documentation

## 2013-11-13 MED ORDER — ENOXAPARIN SODIUM 150 MG/ML ~~LOC~~ SOLN: 150.0000 mg | Freq: Every day | SUBCUTANEOUS | Status: DC

## 2013-11-13 MED ORDER — GOSERELIN ACETATE 3.6 MG ~~LOC~~ IMPL
3.6000 mg | DRUG_IMPLANT | SUBCUTANEOUS | Status: DC
  Administered 2013-11-13: 3.6 mg via SUBCUTANEOUS
  Filled 2013-11-13: qty 3.6

## 2013-11-13 MED ORDER — OXYCODONE HCL 5 MG PO TABS: 5.0000 mg | ORAL_TABLET | ORAL | Status: DC | PRN

## 2013-11-13 MED ORDER — FENTANYL 75 MCG/HR TD PT72: 75.0000 ug | MEDICATED_PATCH | TRANSDERMAL | Status: DC

## 2013-11-14 ENCOUNTER — Telehealth (INDEPENDENT_AMBULATORY_CARE_PROVIDER_SITE_OTHER): Payer: Self-pay

## 2013-11-14 NOTE — Telephone Encounter (Signed)
Called pt to remind her not to take the Lovenox today and she understands. The pt is aware.

## 2013-11-14 NOTE — Telephone Encounter (Signed)
Message copied by Ethlyn Gallery on Wed Nov 14, 2013  9:42 AM ------      Message from: Dwain Sarna, MATTHEW      Created: Wed Nov 14, 2013  8:38 AM       Would you call her today and make sure she stops lovenox. ------

## 2013-11-15 ENCOUNTER — Encounter (HOSPITAL_COMMUNITY): Payer: Self-pay | Admitting: *Deleted

## 2013-11-15 ENCOUNTER — Inpatient Hospital Stay (HOSPITAL_COMMUNITY)
Admission: RE | Admit: 2013-11-15 | Discharge: 2013-11-18 | DRG: 582 | Disposition: A | Discharge status: Home / Self Care | Payer: No Typology Code available for payment source | Source: Ambulatory Visit | Attending: General Surgery | Admitting: General Surgery

## 2013-11-15 ENCOUNTER — Encounter (HOSPITAL_COMMUNITY)
Admission: RE | Disposition: A | Discharge status: Home / Self Care | Payer: Self-pay | Source: Ambulatory Visit | Attending: General Surgery

## 2013-11-15 ENCOUNTER — Encounter: Payer: PRIVATE HEALTH INSURANCE | Admitting: Physical Therapy

## 2013-11-15 ENCOUNTER — Encounter (HOSPITAL_COMMUNITY): Payer: No Typology Code available for payment source | Admitting: Anesthesiology

## 2013-11-15 ENCOUNTER — Ambulatory Visit (HOSPITAL_COMMUNITY): Payer: No Typology Code available for payment source | Admitting: Anesthesiology

## 2013-11-15 DIAGNOSIS — C50919 Malignant neoplasm of unspecified site of unspecified female breast: Secondary | ICD-10-CM

## 2013-11-15 DIAGNOSIS — Z800 Family history of malignant neoplasm of digestive organs: Secondary | ICD-10-CM

## 2013-11-15 DIAGNOSIS — Z8249 Family history of ischemic heart disease and other diseases of the circulatory system: Secondary | ICD-10-CM

## 2013-11-15 DIAGNOSIS — Z9010 Acquired absence of unspecified breast and nipple: Secondary | ICD-10-CM

## 2013-11-15 DIAGNOSIS — Z9012 Acquired absence of left breast and nipple: Secondary | ICD-10-CM

## 2013-11-15 DIAGNOSIS — G8929 Other chronic pain: Secondary | ICD-10-CM | POA: Diagnosis present

## 2013-11-15 DIAGNOSIS — Z833 Family history of diabetes mellitus: Secondary | ICD-10-CM

## 2013-11-15 DIAGNOSIS — Z01812 Encounter for preprocedural laboratory examination: Secondary | ICD-10-CM

## 2013-11-15 DIAGNOSIS — I82729 Chronic embolism and thrombosis of deep veins of unspecified upper extremity: Secondary | ICD-10-CM | POA: Diagnosis present

## 2013-11-15 DIAGNOSIS — Z9221 Personal history of antineoplastic chemotherapy: Secondary | ICD-10-CM

## 2013-11-15 DIAGNOSIS — C50219 Malignant neoplasm of upper-inner quadrant of unspecified female breast: Principal | ICD-10-CM | POA: Diagnosis present

## 2013-11-15 DIAGNOSIS — Z7901 Long term (current) use of anticoagulants: Secondary | ICD-10-CM

## 2013-11-15 DIAGNOSIS — D62 Acute posthemorrhagic anemia: Secondary | ICD-10-CM | POA: Diagnosis not present

## 2013-11-15 HISTORY — PX: MASTECTOMY MODIFIED RADICAL: SHX5962

## 2013-11-15 SURGERY — MASTECTOMY, MODIFIED RADICAL
Anesthesia: General | Site: Breast | Laterality: Left

## 2013-11-15 MED ORDER — OXYCODONE HCL 5 MG/5ML PO SOLN
5.0000 mg | Freq: Once | ORAL | Status: DC | PRN
  Filled 2013-11-15: qty 5

## 2013-11-15 MED ORDER — PROPOFOL 10 MG/ML IV BOLUS
INTRAVENOUS | Status: AC
Start: 2013-11-15 — End: 2013-11-15
  Filled 2013-11-15: qty 20

## 2013-11-15 MED ORDER — OXYCODONE HCL 5 MG PO TABS
5.0000 mg | ORAL_TABLET | ORAL | Status: DC | PRN
  Administered 2013-11-15: 5 mg via ORAL
  Filled 2013-11-15: qty 1

## 2013-11-15 MED ORDER — MIDAZOLAM HCL 5 MG/5ML IJ SOLN
INTRAMUSCULAR | Status: DC | PRN
  Administered 2013-11-15: 2 mg via INTRAVENOUS

## 2013-11-15 MED ORDER — SODIUM CHLORIDE 0.9 % IJ SOLN
INTRAMUSCULAR | Status: AC
  Filled 2013-11-15: qty 10

## 2013-11-15 MED ORDER — FENTANYL 75 MCG/HR TD PT72
75.0000 ug | MEDICATED_PATCH | TRANSDERMAL | Status: DC
  Administered 2013-11-18: 75 ug via TRANSDERMAL
  Filled 2013-11-15: qty 1

## 2013-11-15 MED ORDER — SUCCINYLCHOLINE CHLORIDE 20 MG/ML IJ SOLN
INTRAMUSCULAR | Status: AC
  Filled 2013-11-15: qty 1

## 2013-11-15 MED ORDER — FENTANYL CITRATE 0.05 MG/ML IJ SOLN
INTRAMUSCULAR | Status: DC | PRN
  Administered 2013-11-15: 75 ug via INTRAVENOUS
  Administered 2013-11-15: 100 ug via INTRAVENOUS
  Administered 2013-11-15: 75 ug via INTRAVENOUS

## 2013-11-15 MED ORDER — EPHEDRINE SULFATE 50 MG/ML IJ SOLN
INTRAMUSCULAR | Status: AC
  Filled 2013-11-15: qty 1

## 2013-11-15 MED ORDER — DOCUSATE SODIUM 100 MG PO CAPS
100.0000 mg | ORAL_CAPSULE | Freq: Two times a day (BID) | ORAL | Status: DC
  Administered 2013-11-15 – 2013-11-18 (×6): 100 mg via ORAL
  Filled 2013-11-15 (×7): qty 1

## 2013-11-15 MED ORDER — ACETAMINOPHEN 650 MG RE SUPP: 650.0000 mg | Freq: Four times a day (QID) | RECTAL | Status: DC | PRN

## 2013-11-15 MED ORDER — ONDANSETRON HCL 4 MG/2ML IJ SOLN
4.0000 mg | Freq: Four times a day (QID) | INTRAMUSCULAR | Status: DC | PRN
  Administered 2013-11-15 – 2013-11-16 (×3): 4 mg via INTRAVENOUS
  Filled 2013-11-15 (×2): qty 2

## 2013-11-15 MED ORDER — ONDANSETRON HCL 4 MG/2ML IJ SOLN
INTRAMUSCULAR | Status: DC | PRN
  Administered 2013-11-15: 4 mg via INTRAVENOUS

## 2013-11-15 MED ORDER — LIDOCAINE HCL (CARDIAC) 20 MG/ML IV SOLN
INTRAVENOUS | Status: AC
  Filled 2013-11-15: qty 5

## 2013-11-15 MED ORDER — FENTANYL CITRATE 0.05 MG/ML IJ SOLN
INTRAMUSCULAR | Status: AC
  Filled 2013-11-15: qty 5

## 2013-11-15 MED ORDER — LACTATED RINGERS IV SOLN
INTRAVENOUS | Status: DC
  Administered 2013-11-15: 1000 mL via INTRAVENOUS

## 2013-11-15 MED ORDER — PANTOPRAZOLE SODIUM 40 MG IV SOLR
40.0000 mg | Freq: Every day | INTRAVENOUS | Status: DC
  Administered 2013-11-15 – 2013-11-16 (×2): 40 mg via INTRAVENOUS
  Filled 2013-11-15 (×4): qty 40

## 2013-11-15 MED ORDER — CEFAZOLIN SODIUM-DEXTROSE 2-3 GM-% IV SOLR
2.0000 g | INTRAVENOUS | Status: AC
  Administered 2013-11-15: 2 g via INTRAVENOUS

## 2013-11-15 MED ORDER — HYDROMORPHONE HCL PF 2 MG/ML IJ SOLN
INTRAMUSCULAR | Status: AC
  Filled 2013-11-15: qty 1

## 2013-11-15 MED ORDER — ONDANSETRON HCL 4 MG/2ML IJ SOLN
INTRAMUSCULAR | Status: AC
  Filled 2013-11-15: qty 2

## 2013-11-15 MED ORDER — SODIUM CHLORIDE 0.9 % IV SOLN
INTRAVENOUS | Status: DC
Start: 2013-11-15 — End: 2013-11-17
  Administered 2013-11-15 (×2): via INTRAVENOUS

## 2013-11-15 MED ORDER — ACETAMINOPHEN 325 MG PO TABS: 650.0000 mg | ORAL_TABLET | Freq: Four times a day (QID) | ORAL | Status: DC | PRN

## 2013-11-15 MED ORDER — MEPERIDINE HCL 50 MG/ML IJ SOLN: 6.2500 mg | INTRAMUSCULAR | Status: DC | PRN

## 2013-11-15 MED ORDER — CEFAZOLIN SODIUM-DEXTROSE 2-3 GM-% IV SOLR
INTRAVENOUS | Status: AC
  Filled 2013-11-15: qty 50

## 2013-11-15 MED ORDER — LIDOCAINE HCL (CARDIAC) 20 MG/ML IV SOLN
INTRAVENOUS | Status: DC | PRN
  Administered 2013-11-15: 100 mg via INTRAVENOUS

## 2013-11-15 MED ORDER — 0.9 % SODIUM CHLORIDE (POUR BTL) OPTIME
TOPICAL | Status: DC | PRN
  Administered 2013-11-15: 3000 mL

## 2013-11-15 MED ORDER — CEFAZOLIN SODIUM 1-5 GM-% IV SOLN
1.0000 g | Freq: Three times a day (TID) | INTRAVENOUS | Status: AC
  Administered 2013-11-15 – 2013-11-16 (×3): 1 g via INTRAVENOUS
  Filled 2013-11-15 (×4): qty 50

## 2013-11-15 MED ORDER — HYDROMORPHONE HCL PF 1 MG/ML IJ SOLN
0.2500 mg | INTRAMUSCULAR | Status: DC | PRN
  Administered 2013-11-15: 0.25 mg via INTRAVENOUS

## 2013-11-15 MED ORDER — MIDAZOLAM HCL 2 MG/2ML IJ SOLN
INTRAMUSCULAR | Status: AC
  Filled 2013-11-15: qty 2

## 2013-11-15 MED ORDER — OXYCODONE HCL 5 MG PO TABS: 5.0000 mg | ORAL_TABLET | Freq: Once | ORAL | Status: DC | PRN

## 2013-11-15 MED ORDER — BACITRACIN ZINC 500 UNIT/GM EX OINT
TOPICAL_OINTMENT | CUTANEOUS | Status: AC
  Filled 2013-11-15: qty 28.35

## 2013-11-15 MED ORDER — PROMETHAZINE HCL 25 MG/ML IJ SOLN: 6.2500 mg | INTRAMUSCULAR | Status: DC | PRN

## 2013-11-15 MED ORDER — MORPHINE SULFATE 2 MG/ML IJ SOLN
2.0000 mg | INTRAMUSCULAR | Status: DC | PRN
  Administered 2013-11-15 – 2013-11-16 (×5): 2 mg via INTRAVENOUS
  Filled 2013-11-15 (×5): qty 1

## 2013-11-15 MED ORDER — LORAZEPAM 0.5 MG PO TABS: 0.5000 mg | ORAL_TABLET | Freq: Four times a day (QID) | ORAL | Status: DC | PRN

## 2013-11-15 MED ORDER — HYDROMORPHONE HCL PF 1 MG/ML IJ SOLN
INTRAMUSCULAR | Status: AC
  Filled 2013-11-15: qty 1

## 2013-11-15 MED ORDER — PROPOFOL 10 MG/ML IV BOLUS
INTRAVENOUS | Status: DC | PRN
  Administered 2013-11-15: 200 mg via INTRAVENOUS

## 2013-11-15 MED ORDER — HYDROMORPHONE HCL PF 1 MG/ML IJ SOLN
INTRAMUSCULAR | Status: DC | PRN
  Administered 2013-11-15: 0.5 mg via INTRAVENOUS

## 2013-11-15 MED ORDER — EPHEDRINE SULFATE 50 MG/ML IJ SOLN
INTRAMUSCULAR | Status: DC | PRN
  Administered 2013-11-15: 5 mg via INTRAVENOUS

## 2013-11-15 MED ORDER — FENTANYL 75 MCG/HR TD PT72: 75.0000 ug | MEDICATED_PATCH | TRANSDERMAL | Status: DC

## 2013-11-15 SURGICAL SUPPLY — 51 items
APPLIER CLIP 11 MED OPEN (CLIP) ×4
BENZOIN TINCTURE PRP APPL 2/3 (GAUZE/BANDAGES/DRESSINGS) IMPLANT
BINDER BREAST LRG (GAUZE/BANDAGES/DRESSINGS) IMPLANT
BINDER BREAST XLRG (GAUZE/BANDAGES/DRESSINGS) ×2 IMPLANT
BIOPATCH WHT 1IN DISK W/4.0 H (GAUZE/BANDAGES/DRESSINGS) ×4 IMPLANT
BLADE HEX COATED 2.75 (ELECTRODE) ×2 IMPLANT
BNDG COHESIVE 4X5 TAN STRL (GAUZE/BANDAGES/DRESSINGS) ×2 IMPLANT
CANISTER SUCTION 2500CC (MISCELLANEOUS) ×2 IMPLANT
CHLORAPREP W/TINT 26ML (MISCELLANEOUS) ×2 IMPLANT
CLIP APPLIE 11 MED OPEN (CLIP) ×2 IMPLANT
COVER MAYO STAND STRL (DRAPES) IMPLANT
DERMABOND ADVANCED (GAUZE/BANDAGES/DRESSINGS) ×1
DERMABOND ADVANCED .7 DNX12 (GAUZE/BANDAGES/DRESSINGS) ×1 IMPLANT
DRAIN CHANNEL RND F F (WOUND CARE) ×4 IMPLANT
DRAPE LAPAROSCOPIC ABDOMINAL (DRAPES) IMPLANT
DRAPE LG THREE QUARTER DISP (DRAPES) ×2 IMPLANT
DRAPE ORTHO SPLIT 77X108 STRL (DRAPES) ×2
DRAPE SURG ORHT 6 SPLT 77X108 (DRAPES) ×2 IMPLANT
DRAPE UTILITY XL STRL (DRAPES) ×2 IMPLANT
DRSG PAD ABDOMINAL 8X10 ST (GAUZE/BANDAGES/DRESSINGS) ×2 IMPLANT
ELECT REM PT RETURN 9FT ADLT (ELECTROSURGICAL) ×2
ELECTRODE REM PT RTRN 9FT ADLT (ELECTROSURGICAL) ×1 IMPLANT
EVACUATOR SILICONE 100CC (DRAIN) ×4 IMPLANT
GLOVE BIO SURGEON STRL SZ7 (GLOVE) ×2 IMPLANT
GLOVE BIOGEL PI IND STRL 7.5 (GLOVE) ×1 IMPLANT
GLOVE BIOGEL PI INDICATOR 7.5 (GLOVE) ×1
GOWN PREVENTION PLUS LG XLONG (DISPOSABLE) ×2 IMPLANT
GOWN STRL REIN XL XLG (GOWN DISPOSABLE) ×6 IMPLANT
KIT BASIN OR (CUSTOM PROCEDURE TRAY) ×2 IMPLANT
NS IRRIG 1000ML POUR BTL (IV SOLUTION) ×2 IMPLANT
PACK GENERAL/GYN (CUSTOM PROCEDURE TRAY) ×2 IMPLANT
PAD ABD 8X10 STRL (GAUZE/BANDAGES/DRESSINGS) IMPLANT
SPONGE DRAIN TRACH 4X4 STRL 2S (GAUZE/BANDAGES/DRESSINGS) ×4 IMPLANT
SPONGE GAUZE 4X4 12PLY (GAUZE/BANDAGES/DRESSINGS) IMPLANT
SPONGE LAP 18X18 X RAY DECT (DISPOSABLE) ×10 IMPLANT
STAPLER VISISTAT 35W (STAPLE) ×2 IMPLANT
STOCKINETTE 6  STRL (DRAPES)
STOCKINETTE 6 STRL (DRAPES) IMPLANT
STOCKINETTE 8 INCH (MISCELLANEOUS) ×2 IMPLANT
STRIP CLOSURE SKIN 1/2X4 (GAUZE/BANDAGES/DRESSINGS) ×2 IMPLANT
SUT ETHILON 3 0 PS 1 (SUTURE) ×4 IMPLANT
SUT MON AB 5-0 PS2 18 (SUTURE) ×10 IMPLANT
SUT SILK 2 0 SH CR/8 (SUTURE) ×2 IMPLANT
SUT VIC AB 2-0 BRD 54 (SUTURE) IMPLANT
SUT VIC AB 2-0 SH 18 (SUTURE) ×2 IMPLANT
SUT VIC AB 3-0 SH 8-18 (SUTURE) ×8 IMPLANT
TOWEL OR 17X26 10 PK STRL BLUE (TOWEL DISPOSABLE) ×2 IMPLANT
TOWEL OR NON WOVEN STRL DISP B (DISPOSABLE) ×2 IMPLANT
TRAY FOLEY CATH 14FRSI W/METER (CATHETERS) IMPLANT
TUBING CONNECTING 10 (TUBING) ×2 IMPLANT
YANKAUER SUCT BULB TIP NO VENT (SUCTIONS) ×2 IMPLANT

## 2013-11-15 NOTE — Preoperative (Signed)
Beta Blockers   Reason not to administer Beta Blockers:Not Applicable 

## 2013-11-15 NOTE — H&P (View-Only) (Signed)
Patient ID: Erika [name], female   DOB: 05/30/1971, 42 y.o.   MRN: [id number]  Chief Complaint  Patient presents with  . Pre-op Exam    discuss sx    HPI Erika [name] is a 42 y.o. female.   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 She eventually was seen in an urgent care where this mass was thought to be an abscess. Apparently there was an attempt to excise or drain this that was not successful and she was referred to our office for evaluation. She was seen in our urgent office and underwent punch biopsy of her skin and the mass. She was then also sent for mm/us. 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 It is her2 not amplified, er pos at 15, pr neg and Ki approaches 100%. She has also undergone mm with at least 10 cm upper inner left breast tumor with mild overyling skin thickening. There is also a 3x10 mm cluster of calcs near there also. There is indeterminate 7 mm level 1 left axillary node. No mm evidence of right breast malignancy. The MR showed large mass with another small area on left that is negative on prior biopsy. She has undergone primary chemotherapy now with a good result.  I saw her last week to discuss surgery and to see if she was able to enter neoadjuvant/node study.  She returns today after considering whether she wanted bilateral mastectomies vs left mastectomy.  No interval change.  Past Medical History  Diagnosis Date  . Breast cancer     Invasive High Grade Carcinoma  . Headache(784.0)     Hx: of Migraines  . Status post chemotherapy     Adriamycin and Cytoxan    Past Surgical History  Procedure Laterality Date  . Tubal ligation    . Portacath placement Right 05/22/2013    Procedure: INSERTION PORT-A-CATH;  Surgeon: Natlie Asfour, MD;  Location: [facility] OR;  Service: General;  Laterality: Right;  . Left breast needle core biopsy Left 05/21/13  . Left breast needle core  biopsy Left  06/22/13    Family History  Problem Relation Age of Onset  . Hypertension Mother   . Diabetes Father 16  . Stomach cancer Maternal Aunt   . Hypertension Maternal Grandmother   . Hypertension Maternal Grandfather   . Diabetes Paternal Grandmother   . Diabetes Paternal Grandfather   . Diabetes Paternal Uncle   . Diabetes Paternal Uncle   . Diabetes Paternal Uncle     Social History History  Substance Use Topics  . Smoking status: Never Smoker   . Smokeless tobacco: Never Used  . Alcohol Use: No    No Known Allergies  Current Outpatient Prescriptions  Medication Sig Dispense Refill  . cyclobenzaprine (FLEXERIL) 5 MG tablet Take 1 tablet (5 mg total) by mouth 3 (three) times daily as needed for muscle spasms.  30 tablet  0  . dexamethasone (DECADRON) 4 MG tablet Take 2 tablets (8 mg total) by mouth See admin instructions. Take daily for 3 days starting day after chemo  30 tablet  0  . enoxaparin (LOVENOX) 150 MG/ML injection Inject 1 mL (150 mg total) into the skin daily.  30 Syringe  6  . fentaNYL (DURAGESIC - DOSED MCG/HR) 75 MCG/HR Place 1 patch (75 mcg total) onto the skin every 3 (three) days.  10 patch  0  . ibuprofen (ADVIL,MOTRIN) 200 MG tablet Take   600-800 mg by mouth every 4 (four) hours as needed for pain.       . lidocaine-prilocaine (EMLA) cream Apply 1 application topically every 14 (fourteen) days. Every other Friday prior to chemo      . LORazepam (ATIVAN) 0.5 MG tablet Take 1 tablet (0.5 mg total) by mouth every 6 (six) hours as needed for anxiety.  30 tablet  0  . oxyCODONE (OXY IR/ROXICODONE) 5 MG immediate release tablet Take 1 tablet (5 mg total) by mouth every 4 (four) hours as needed for pain.  60 tablet  0  . potassium chloride SA (K-DUR,KLOR-CON) 20 MEQ tablet Take 20 mEq by mouth daily.      . zolpidem (AMBIEN) 5 MG tablet Take 1 tablet (5 mg total) by mouth at bedtime as needed for sleep.  30 tablet  0   No current facility-administered medications for  this visit.    Review of Systems Review of Systems  Blood pressure 128/88, pulse 80, temperature 97.4 F (36.3 C), temperature source Temporal, resp. rate 18, height 5' 5.5" (1.664 m), weight 240 lb 6.4 oz (109.045 kg), last menstrual period 05/30/2013.   Physical Exam   Constitutional: She appears well-developed and well-nourished.  Eyes: No scleral icterus.  Neck: Neck supple.  Cardiovascular: Normal rate, regular rhythm and normal heart sounds.  Pulmonary/Chest: Effort normal and breath sounds normal. Right breast exhibits no inverted nipple, no mass, no nipple discharge, no skin change and no tenderness. Left breast exhibits mass.    Lymphadenopathy:  She has no cervical adenopathy.  She has no axillary adenopathy.  Right: No supraclavicular adenopathy present.  Left: No supraclavicular adenopathy present.     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 Due to that I recommended her to standard treatment for a stage IIIB cancer preoperatively which will be a left modified radical mastectomy. We discussed the performance of that procedure as well as the risks including bleeding, infection, wound dehiscence, flap death, arm swelling or lymphedema over the long-term which a quarter or risk of up to 40% given the fact that she will need radiation, chronic shoulder pain and numbness around her shoulder. She understands all these risks. She understands that her arm and shoulder may not be normal after the surgery as well as radiation may certainly impact her ability to do her job. We discussed a sentinel node biopsy and the role of that for her disease but I don't think it is proven that that would be an acceptable procedure for her disease state. I will have her see physical therapy prior to surgery to see if we can work on prevention. We also discussed a bilateral mastectomy I  think he would be a wiser choice right down to just proceed with taking care of the left side and leaving the right side for later date when she decides if she can undergo reconstruction on this side.        Tiny Chaudhary 10/19/2013, 12:12 PM    

## 2013-11-15 NOTE — Transfer of Care (Signed)
Immediate Anesthesia Transfer of Care Note  Patient: Erika [name]  Procedure(s) Performed: Procedure(s): LEFT MASTECTOMY MODIFIED RADICAL (Left)  Patient Location: PACU  Anesthesia Type:General  Level of Consciousness: awake, alert  and oriented  Airway & Oxygen Therapy: Patient Spontanous Breathing and Patient connected to face mask oxygen  Post-op Assessment: Report given to PACU RN and Post -op Vital signs reviewed and stable  Post vital signs: Reviewed and stable  Complications: No apparent anesthesia complications

## 2013-11-15 NOTE — Anesthesia Preprocedure Evaluation (Addendum)
Anesthesia Evaluation  Patient identified by MRN, date of birth, ID band Patient awake    Reviewed: Allergy & Precautions, H&P , NPO status , Patient's Chart, lab work & pertinent test results  Airway Mallampati: II TM Distance: >3 FB Neck ROM: Full    Dental  (+) Dental Advisory Given   Pulmonary neg pulmonary ROS,  breath sounds clear to auscultation        Cardiovascular negative cardio ROS  Rhythm:Regular Rate:Normal     Neuro/Psych  Headaches, PSYCHIATRIC DISORDERS Anxiety    GI/Hepatic negative GI ROS, Neg liver ROS,   Endo/Other  Morbid obesity  Renal/GU negative Renal ROS     Musculoskeletal   Abdominal   Peds  Hematology   Anesthesia Other Findings   Reproductive/Obstetrics                          Anesthesia Physical  Anesthesia Plan  ASA: III  Anesthesia Plan: General   Post-op Pain Management:    Induction: Intravenous  Airway Management Planned: LMA  Additional Equipment:   Intra-op Plan:   Post-operative Plan: Extubation in OR  Informed Consent: I have reviewed the patients History and Physical, chart, labs and discussed the procedure including the risks, benefits and alternatives for the proposed anesthesia with the patient or authorized representative who has indicated his/her understanding and acceptance.   Dental advisory given  Plan Discussed with: CRNA  Anesthesia Plan Comments:        Anesthesia Quick Evaluation

## 2013-11-15 NOTE — Interval H&P Note (Signed)
History and Physical Interval Note:  11/15/2013 2:13 PM  Erika [name]  has presented today for surgery, with the diagnosis of breast cancer   The various methods of treatment have been discussed with the patient and family. After consideration of risks, benefits and other options for treatment, the patient has consented to  Procedure(s): MASTECTOMY MODIFIED RADICAL (Left) as a surgical intervention .  The patient's history has been reviewed, patient examined, no change in status, stable for surgery.  I have reviewed the patient's chart and labs.  Questions were answered to the patient's satisfaction.     [name]

## 2013-11-15 NOTE — Op Note (Signed)
Preoperative diagnosis: Stage III left breast cancer s/p primary chemotherapy Postoperative diagnosis: same as above Procedure: left modified radical mastectomy Surgeon: Dr Harden Mo Anesthesia: general EBL: 200 cc Drains: 2 19 Fr blake drains Specimen: left breast and axillary nodes with short superior and long marks axillary contents, additional axillary tissue Sponge and needle count correct at completion Disposition to recovery stable Complications none  Indications: This is a 61 yof who had locally advanced stage III left breast cancer who has undergone primary systemic chemotherapy with a good response  She has had a dvt and is on lovenox.  She is otherwise doing well and we discussed surgery.  No trials were available due to stage at beginning.  We planned a left mrm  Procedure: After informed consent was obtained the patient was taken to the operating room.  She was given cefazolin. SCDs were in place.  She was placed under general anesthesia without complication.  Her left breast and arm were prepped and draped in the standard sterile surgical fashion.  A surgical timeout was performed.  I made a large elliptical incision to include a lot of skin overlying the tumor and nipple areolar complex.  I carried flaps up to the clavicle, sternum, inframammary crease. This was difficult medially due to the tumor although it had a good response.  I then removed the breast from the pectoralis muscle including the fascia.  This was very adherent at site of tumor and some muscle was removed.  I then rolled this laterally until I was in the axilla. I proceed to remove the breast from the latissimus.  I then entered the axilla.  I identified the axilla, thoracodorsal bundle and the long thoracic nerve.  I swept the contents of the axilla caudad off the muscle.  There were not really any enlarged nodes I could feel.  I then removed this along with the breast.  This was marked as above.  I then spent  some time obtaining hemostasis with a combination of cautery and stitches.  I then placed 2 19 Fr blake drains and secured these with 2-0 nylon suture.  I then closed the dermis with 3-0 vicryl until I got lateral.  I then removed a fair amount of redundant skin and then closed this portion vertically.  The remainder of the incision was closed with 4-0 monocryl.  I did place a few staples at inferior aspect.  I then used dermabond and steristrips.  A dressing and binder was placed.  She tolerated this well and was transferred to recovery stable.

## 2013-11-15 NOTE — Brief Op Note (Signed)
11/15/2013  5:24 PM  PATIENT:  Herb Grays  42 y.o. female  PRE-OPERATIVE DIAGNOSIS:  breast cancer left  POST-OPERATIVE DIAGNOSIS:  breast cancer left  PROCEDURE:  Procedure(s): LEFT MASTECTOMY MODIFIED RADICAL (Left)  SURGEON:  Surgeon(s) and Role:    * Emelia Loron, MD - Primary  PHYSICIAN ASSISTANT: none  ASSISTANTS: none   ANESTHESIA:   general  EBL:  Total I/O In: 1800 [I.V.:1800] Out: 400 [Blood:400]  BLOOD ADMINISTERED:none  DRAINS: (19 FR) Blake drain(s) in the mastectomy space   LOCAL MEDICATIONS USED:  NONE  SPECIMEN:  Modified Radical Mastectomy  DISPOSITION OF SPECIMEN:  PATHOLOGY  COUNTS:  YES  TOURNIQUET:  * No tourniquets in log *  DICTATION: .Dragon Dictation  PLAN OF CARE: Admit to inpatient   PATIENT DISPOSITION:  PACU - hemodynamically stable.   Delay start of Pharmacological VTE agent (>24hrs) due to surgical blood loss or risk of bleeding: yes

## 2013-11-16 ENCOUNTER — Encounter (HOSPITAL_COMMUNITY): Payer: Self-pay | Admitting: General Surgery

## 2013-11-16 LAB — BASIC METABOLIC PANEL
CO2: 27 mEq/L (ref 19–32)
Calcium: 8.9 mg/dL (ref 8.4–10.5)
Chloride: 103 mEq/L (ref 96–112)
Creatinine, Ser: 0.86 mg/dL (ref 0.50–1.10)
Glucose, Bld: 116 mg/dL — ABNORMAL HIGH (ref 70–99)
Potassium: 3.5 mEq/L (ref 3.5–5.1)
Sodium: 138 mEq/L (ref 135–145)

## 2013-11-16 LAB — CBC
HCT: 28.1 % — ABNORMAL LOW (ref 36.0–46.0)
MCH: 31.5 pg (ref 26.0–34.0)
MCV: 95.3 fL (ref 78.0–100.0)
Platelets: 258 10*3/uL (ref 150–400)
RBC: 2.95 MIL/uL — ABNORMAL LOW (ref 3.87–5.11)

## 2013-11-16 MED ORDER — OXYCODONE HCL 5 MG PO TABS
5.0000 mg | ORAL_TABLET | ORAL | Status: DC | PRN
  Administered 2013-11-16: 5 mg via ORAL
  Administered 2013-11-16: 10 mg via ORAL
  Administered 2013-11-17 – 2013-11-18 (×5): 15 mg via ORAL
  Filled 2013-11-16 (×3): qty 3
  Filled 2013-11-16: qty 2
  Filled 2013-11-16: qty 1
  Filled 2013-11-16: qty 3
  Filled 2013-11-16: qty 2
  Filled 2013-11-16: qty 3

## 2013-11-16 MED ORDER — OXYCODONE HCL 5 MG PO TABS: 5.0000 mg | ORAL_TABLET | Freq: Four times a day (QID) | ORAL | Status: DC | PRN

## 2013-11-16 MED ORDER — PROMETHAZINE HCL 25 MG/ML IJ SOLN
12.5000 mg | Freq: Four times a day (QID) | INTRAMUSCULAR | Status: DC | PRN
  Administered 2013-11-16 – 2013-11-17 (×2): 12.5 mg via INTRAVENOUS
  Filled 2013-11-16 (×2): qty 1

## 2013-11-16 NOTE — Progress Notes (Signed)
1 Day Post-Op  Subjective: Feels well, still taking some iv pain meds but wants to go home  Objective: Vital signs in last 24 hours: Temp:  [98 F (36.7 C)-99 F (37.2 C)] 98.2 F (36.8 C) (12/05 0517) Pulse Rate:  [103-117] 115 (12/05 0517) Resp:  [16-20] 16 (12/05 0517) BP: (102-139)/(71-89) 113/81 mmHg (12/05 0517) SpO2:  [92 %-100 %] 92 % (12/05 0517) Weight:  [251 lb (113.853 kg)] 251 lb (113.853 kg) (12/04 1930) Last BM Date: 11/15/13  Intake/Output from previous day: 12/04 0701 - 12/05 0700 In: 2830 [I.V.:2830] Out: 990 [Urine:450; Drains:140; Blood:400] Intake/Output this shift:    General appearance: no distress Resp: clear to auscultation bilaterally Cardio: tachy rr Incision/Wound:flaps vialble, drains serosang as expected  Lab Results:   Recent Labs  11/16/13 0448  WBC 8.5  HGB 9.3*  HCT 28.1*  PLT 258   BMET  Recent Labs  11/16/13 0448  NA 138  K 3.5  CL 103  CO2 27  GLUCOSE 116*  BUN 8  CREATININE 0.86  CALCIUM 8.9    Anti-infectives: Anti-infectives   Start     Dose/Rate Route Frequency Ordered Stop   11/15/13 2200  ceFAZolin (ANCEF) IVPB 1 g/50 mL premix     1 g 100 mL/hr over 30 Minutes Intravenous 3 times per day 11/15/13 1731 11/16/13 2159   11/15/13 1245  ceFAZolin (ANCEF) IVPB 2 g/50 mL premix     2 g 100 mL/hr over 30 Minutes Intravenous On call to O.R. 11/15/13 1230 11/15/13 1430      Assessment/Plan: POD 1 left mrm 1. Will try to keep on oral pain meds, if unable to control will need to stay until tomorrow 2. Regular diet 3. If dc today will restart lovenox Sunday night, I am concerned with blood loss at surgery at size/tumor that she will have trouble if we do sooner although I know this is treatment for dvt 4. I will see Monday if dc today.  [facility] 11/16/2013

## 2013-11-16 NOTE — Progress Notes (Signed)
UR completed 

## 2013-11-17 LAB — CBC
HCT: 26.8 % — ABNORMAL LOW (ref 36.0–46.0)
MCH: 31.4 pg (ref 26.0–34.0)
MCHC: 33.2 g/dL (ref 30.0–36.0)
Platelets: 232 10*3/uL (ref 150–400)
RDW: 15.9 % — ABNORMAL HIGH (ref 11.5–15.5)

## 2013-11-17 NOTE — Progress Notes (Signed)
Utilization Review completed.  

## 2013-11-17 NOTE — Progress Notes (Addendum)
General Surgery Note  LOS: 2 days  POD -  2 Days Post-Op  Assessment/Plan: 1.  LEFT MASTECTOMY MODIFIED RADICAL - 11/15/2013 - M. [place].  Oncology - [facility].  Patient actually looks good, but she wants to wait one more day to go home.  Will stop IVF an saline lock.  Will go ahead and give patient's husband her pain presciption to get filled.  2. DVT prophylaxis - PAS.  Lovenox on hold for now.  She is to restart on Sunday. 3.  Acute blood loss anemia - stable  Hgb - 8.9 - 11/17/2013 4.  Chronic pain - non specific  On Fentanyl patch   Active Problems:   S/P mastectomy  Subjective:  Doing okay, but does not want to go home.  Husband in room. Objective:   Filed Vitals:   11/17/13 0520  BP: 112/82  Pulse: 110  Temp: 98.5 F (36.9 C)  Resp: 18     Intake/Output from previous day:  12/05 0701 - 12/06 0700 In: 1230 [P.O.:480; I.V.:600; IV Piggyback:150] Out: 1600 [Urine:1400; Drains:200]  Intake/Output this shift:      Physical Exam:   General: WN AA F who is alert and oriented.    HEENT: Normal. Pupils equal. .   Lungs: Clear.   Wound: Looks good,  No swelling,  Drain 1/2 - 160/40   Lab Results:    Recent Labs  11/16/13 0448 11/17/13 0540  WBC 8.5 7.6  HGB 9.3* 8.9*  HCT 28.1* 26.8*  PLT 258 232    BMET   Recent Labs  11/16/13 0448  NA 138  K 3.5  CL 103  CO2 27  GLUCOSE 116*  BUN 8  CREATININE 0.86  CALCIUM 8.9    PT/INR  No results found for this basename: LABPROT, INR,  in the last 72 hours  ABG  No results found for this basename: PHART, PCO2, PO2, HCO3,  in the last 72 hours   Studies/Results:  No results found.   Anti-infectives:   Anti-infectives   Start     Dose/Rate Route Frequency Ordered Stop   11/15/13 2200  ceFAZolin (ANCEF) IVPB 1 g/50 mL premix     1 g 100 mL/hr over 30 Minutes Intravenous 3 times per day 11/15/13 1731 11/16/13 1259   11/15/13 1245  ceFAZolin (ANCEF) IVPB 2 g/50 mL premix     2 g 100 mL/hr over 30  Minutes Intravenous On call to O.R. 11/15/13 1230 11/15/13 1430      Ovidio Kin, MD, FACS Pager: [phone] [facility]  Surgery Office: [phone] 11/17/2013

## 2013-11-18 NOTE — Discharge Summary (Signed)
Physician Discharge Summary  Patient ID:  Erika [name]  MRN: [id number]  DOB/AGE: 1971/08/24 42 y.o.  Admit date: 11/15/2013 Discharge date: 11/18/2013  Discharge Diagnoses:  1.  Left breast cancer, Stage IIIB 2.  Chronic pain 3.  History of right arm DVT 4.  Acute blood loss anemia  Operation: Procedure(s): LEFT MASTECTOMY MODIFIED RADICAL on 11/15/2013 -  M. Dwain Sarna  Discharged Condition: good  Hospital Course: Akyra Bouchie is an 42 y.o. female whose primary care physician is [name], MD and who was admitted 11/15/2013 with a chief complaint of left breast cancer.   She was brought to the operating room on 11/15/2013 and underwent LEFT MASTECTOMY MODIFIED RADICAL.   The patient's hgb on POD #2 was 8.9.  She was still feeling puny.  But today she is doing better, her husband is in the room, and she is ready to go home.  The discharge instructions were reviewed with the patient.  Consults: None  Significant Diagnostic Studies: Results for orders placed during the hospital encounter of 11/15/13  BASIC METABOLIC PANEL      Result Value Range   Sodium 138  135 - 145 mEq/L   Potassium 3.5  3.5 - 5.1 mEq/L   Chloride 103  96 - 112 mEq/L   CO2 27  19 - 32 mEq/L   Glucose, Bld 116 (*) 70 - 99 mg/dL   BUN 8  6 - 23 mg/dL   Creatinine, Ser 4.09  0.50 - 1.10 mg/dL   Calcium 8.9  8.4 - 81.1 mg/dL   GFR calc non Af Amer 82 (*) >90 mL/min   GFR calc Af Amer >90  >90 mL/min  CBC      Result Value Range   WBC 8.5  4.0 - 10.5 K/uL   RBC 2.95 (*) 3.87 - 5.11 MIL/uL   Hemoglobin 9.3 (*) 12.0 - 15.0 g/dL   HCT 91.4 (*) 78.2 - 95.6 %   MCV 95.3  78.0 - 100.0 fL   MCH 31.5  26.0 - 34.0 pg   MCHC 33.1  30.0 - 36.0 g/dL   RDW 21.3 (*) 08.6 - 57.8 %   Platelets 258  150 - 400 K/uL  CBC      Result Value Range   WBC 7.6  4.0 - 10.5 K/uL   RBC 2.83 (*) 3.87 - 5.11 MIL/uL   Hemoglobin 8.9 (*) 12.0 - 15.0 g/dL   HCT 46.9 (*) 62.9 - 52.8 %   MCV 94.7  78.0 - 100.0 fL   MCH  31.4  26.0 - 34.0 pg   MCHC 33.2  30.0 - 36.0 g/dL   RDW 41.3 (*) 24.4 - 01.0 %   Platelets 232  150 - 400 K/uL    No results found.  Discharge Exam:  Filed Vitals:   11/18/13 0540  BP: 110/75  Pulse: 119  Temp: 98.4 F (36.9 C)  Resp: 18    General: WN AA F who is alert and generally healthy appearing.  Chest:  Left mastectomy dressing okay.  2 drains: #1/2 - 96/174 cc over last 24 hours.  Lungs: Clear to auscultation and symmetric breath sounds.  Using new IS machine. Heart:  RRR. No murmur or rub.  Discharge Medications:     Medication List         cyclobenzaprine 5 MG tablet  Commonly known as:  FLEXERIL  TAKE 1 TABLET BY MOUTH THREE TIMES A DAY FOR MUSCLE SPASMS     dexamethasone 4 MG  tablet  Commonly known as:  DECADRON  Take 8 mg by mouth 2 (two) times daily. Take daily for 3 days starting day after chemo     enoxaparin 150 MG/ML injection  Commonly known as:  LOVENOX  Inject 1 mL (150 mg total) into the skin daily.     fentaNYL 75 MCG/HR  Commonly known as:  DURAGESIC - dosed mcg/hr  Place 1 patch (75 mcg total) onto the skin every 3 (three) days.     ibuprofen 200 MG tablet  Commonly known as:  ADVIL,MOTRIN  Take 600-800 mg by mouth every 4 (four) hours as needed for mild pain or moderate pain.     lidocaine-prilocaine cream  Commonly known as:  EMLA  Apply 1 application topically every 14 (fourteen) days. Every other Friday prior to chemo     LORazepam 0.5 MG tablet  Commonly known as:  ATIVAN  Take 0.5 mg by mouth every 6 (six) hours as needed for anxiety.     oxyCODONE 5 MG immediate release tablet  Commonly known as:  Oxy IR/ROXICODONE  Take 1 tablet (5 mg total) by mouth every 4 (four) hours as needed for moderate pain or severe pain.     oxyCODONE 5 MG immediate release tablet  Commonly known as:  Oxy IR/ROXICODONE  Take 1-3 tablets (5-15 mg total) by mouth every 6 (six) hours as needed for moderate pain.     potassium chloride SA 20 MEQ  tablet  Commonly known as:  K-DUR,KLOR-CON  Take 20 mEq by mouth daily.     PRESCRIPTION MEDICATION  PT REPORTS RECEIVING A SHOT ONCE A MONTH AT CAN[facility]ER CENTER TO STOP HER MENSTRUAL PERIODS - I SEE DOCUMENTATION IN OFFICE NOTES FROM CANCER CENTER THAT PT WAS GETTING ZOLADEX SQ EVERY 28 DAYS.     zolpidem 5 MG tablet  Commonly known as:  AMBIEN  Take 5 mg by mouth at bedtime as needed for sleep.        Disposition: 01-Home or Self Care      Discharge Orders   Future Appointments Provider Department Dept Phone   11/26/2013 3:00 PM Mauri [facility] MEDICAL ONCOLOGY [phone]   11/26/2013 3:30 PM Victorino December, MD [facility] MEDICAL ONCOLOGY [phone]   12/11/2013 3:30 PM [facility] [phone]   Future Orders Complete By Expires   Diet - low sodium heart healthy  As directed    Increase activity slowly  As directed       Follow-up Information   Call office - Emelia Loron, MD tomorrow.   Specialty:  General Surgery   Contact information:   [address] [place] [address] [phone]      [facility] - DISCHARGE INSTRUCTIONS TO PATIENT  Activity:  Driving - May drive in 3 or 4 days, if doing well.   Lifting - Move left arm normally.  No lifting > 15 pounds for 1 week.  Wound Care:   To empty drains at home and record the amount.  Diet:  As tolerated  Follow up appointment:  Call Dr. Doreen Salvage office [facility] Surgery) at [phone] for an appointment tomorrow.  Medications and dosages:  Resume your home medications.  You have a prescription for:  Oxycodone.  Signed: Ovidio Kin, M.D., [facility] Surgery Office:  [phone]  11/18/2013, 8:33 AM

## 2013-11-18 NOTE — Progress Notes (Signed)
Discharge instructions discussed with pt . Pt able to verbalize diet, activity,follow up appointment and when to call md . Pt demonstrated how to empty jp drains and record amounts for me correctly. Given ample time for questions. After question time pt able to answer questions about home meds.am assessment unchanged.

## 2013-11-19 NOTE — Anesthesia Postprocedure Evaluation (Signed)
Anesthesia Post Note  Patient: Erika [name]  Procedure(s) Performed: Procedure(s) (LRB): LEFT MASTECTOMY MODIFIED RADICAL (Left)  Anesthesia type: General  Patient location: PACU  Post pain: Pain level controlled  Post assessment: Post-op Vital signs reviewed  Last Vitals: BP 110/75  Pulse 119  Temp(Src) 36.9 C (Oral)  Resp 18  Ht 5\' 5"  (1.651 m)  Wt 251 lb (113.853 kg)  BMI 41.77 kg/m2  SpO2 98%  LMP 05/30/2013  Post vital signs: Reviewed  Level of consciousness: sedated  Complications: No apparent anesthesia complications

## 2013-11-23 ENCOUNTER — Ambulatory Visit (INDEPENDENT_AMBULATORY_CARE_PROVIDER_SITE_OTHER): Payer: PRIVATE HEALTH INSURANCE | Admitting: General Surgery

## 2013-11-23 ENCOUNTER — Encounter (INDEPENDENT_AMBULATORY_CARE_PROVIDER_SITE_OTHER): Payer: Self-pay | Admitting: General Surgery

## 2013-11-23 VITALS — BP 128/88 | HR 64 | Temp 97.8°F | Resp 20 | Ht 65.5 in | Wt 241.6 lb

## 2013-11-23 DIAGNOSIS — Z09 Encounter for follow-up examination after completed treatment for conditions other than malignant neoplasm: Secondary | ICD-10-CM

## 2013-11-23 MED ORDER — OXYCODONE HCL 5 MG PO TABS: 5.0000 mg | ORAL_TABLET | ORAL | Status: DC | PRN

## 2013-11-23 NOTE — Progress Notes (Signed)
Subjective:     Patient ID: Erika [name], female   DOB: 14-Apr-1971, 42 y.o.   MRN: [id number]  HPI This is a 42 year old female with a locally advanced left breast cancer underwent primary systemic therapy followed recently by a left modified radical mastectomy. This was difficult due to her size. 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 There was a residual invasive ductal carcinoma that was grade 3 measuring 6.5 cm it was 2 mm from the inked deep margin. There is angiolymphatic invasion. There is extensive posttreatment effect. There 12 nodes are negative. She comes in today with some pain at the site but is otherwise doing well. Her drains are still putting out over 30 cc each for 24 hours She is back on her Lovenox.  Review of Systems     Objective:   Physical Exam Left mastectomy incision is healing without infection, there is some skin separation laterally, drains are putting out serous fluid    Assessment:      status post left modified radical mastectomy     Plan:     Both of her drains are going to remain today. I did refill her pain medication. We'll keep an eye on her wound. I will plan on seeing her next week. We reviewed her pathology today.

## 2013-11-26 ENCOUNTER — Encounter: Payer: Self-pay | Admitting: Oncology

## 2013-11-26 ENCOUNTER — Ambulatory Visit (HOSPITAL_BASED_OUTPATIENT_CLINIC_OR_DEPARTMENT_OTHER): Payer: No Typology Code available for payment source | Admitting: Oncology

## 2013-11-26 ENCOUNTER — Other Ambulatory Visit (HOSPITAL_BASED_OUTPATIENT_CLINIC_OR_DEPARTMENT_OTHER): Payer: PRIVATE HEALTH INSURANCE

## 2013-11-26 VITALS — BP 141/69 | HR 105 | Temp 98.3°F | Resp 20 | Ht 65.5 in | Wt 243.9 lb

## 2013-11-26 DIAGNOSIS — N920 Excessive and frequent menstruation with regular cycle: Secondary | ICD-10-CM

## 2013-11-26 DIAGNOSIS — Z86718 Personal history of other venous thrombosis and embolism: Secondary | ICD-10-CM

## 2013-11-26 DIAGNOSIS — D509 Iron deficiency anemia, unspecified: Secondary | ICD-10-CM

## 2013-11-26 DIAGNOSIS — C50219 Malignant neoplasm of upper-inner quadrant of unspecified female breast: Secondary | ICD-10-CM

## 2013-11-26 DIAGNOSIS — C50212 Malignant neoplasm of upper-inner quadrant of left female breast: Secondary | ICD-10-CM

## 2013-11-26 DIAGNOSIS — D508 Other iron deficiency anemias: Secondary | ICD-10-CM

## 2013-11-26 HISTORY — DX: Iron deficiency anemia, unspecified: D50.9

## 2013-11-26 LAB — COMPREHENSIVE METABOLIC PANEL (CC13)
ALT: 22 U/L (ref 0–55)
AST: 24 U/L (ref 5–34)
Alkaline Phosphatase: 95 U/L (ref 40–150)
Anion Gap: 10 mEq/L (ref 3–11)
Creatinine: 0.9 mg/dL (ref 0.6–1.1)
Potassium: 3.8 mEq/L (ref 3.5–5.1)
Total Bilirubin: <0.2 mg/dL (ref 0.20–1.20)

## 2013-11-26 LAB — CBC WITH DIFFERENTIAL/PLATELET
BASO%: 0.3 % (ref 0.0–2.0)
EOS%: 8.3 % — ABNORMAL HIGH (ref 0.0–7.0)
Eosinophils Absolute: 0.5 10*3/uL (ref 0.0–0.5)
HCT: 27.5 % — ABNORMAL LOW (ref 34.8–46.6)
LYMPH%: 29.5 % (ref 14.0–49.7)
MCH: 32.2 pg (ref 25.1–34.0)
MCHC: 34.2 g/dL (ref 31.5–36.0)
MCV: 94.1 fL (ref 79.5–101.0)
MONO%: 5.2 % (ref 0.0–14.0)
NEUT#: 3.7 10*3/uL (ref 1.5–6.5)
NEUT%: 56.7 % (ref 38.4–76.8)
Platelets: 449 10*3/uL — ABNORMAL HIGH (ref 145–400)
lymph#: 1.9 10*3/uL (ref 0.9–3.3)

## 2013-11-26 NOTE — Progress Notes (Signed)
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    PRIOR THERAPY: 1. 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 2 months ago this began getting larger rapidly. She was seen in an urgent care where this mass was thought to be an abscess. Apparently there was an attempt to excise or drain this that was not successful and she was referred to Dr. Dwain Sarna for evaluation. She underwent punch biopsy on 04/23/13 of her skin and the mass. She was then also sent for mm/us. The biopsy shows invasive high grade carcinoma that appears to be breast. It is ER 14%, PR negative, HER-2/neu negative with a Ki-67 that approaches 100%. She has also undergone mm with at least 10 cm upper inner left breast tumor with mild overyling skin thickening. There is also a 3x10 mm cluster of calcs near there also. There is indeterminate 7 mm level 1 left axillary node, she underwent a lymph node biopsy that was positive for disease. No mm evidence of right breast malignancy.  An MRI of the breasts was done on 05/22/13 and showed a 14 cm mass in the left breast protruding from the skin and also a 2.5 cm level II axillary node.   2.  Patient evaluated and began on neoadjuvant chemotherapy.  Adriamycin and Cytoxan began on 05/25/13.  She completed 4 cycles with Neulasta support.   Taxol carbo began on 8/8. 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  #3 staging PET/CT performed on 06/13/2013 revealed no evidence of distant disease.  #4 anemia of iron deficiency  #5 Right upper extremity DVT: Patient is on Lovenox since July 2014  CURRENT THERAPY: refer to Dr. Lurline Hare  INTERVAL HISTORY: Erika [name] 42 y.o. female returns forfollowup visit today. She has had her  mastectomy. There was significant amount of residual disease left in the breast however lymph nodes were negative. Pathology is noted elsewhere in the medical record. I have reviewed personally. Clinically she seems to be doing well. She does still continue to have pain at the mastectomy site. She still has a JP drains in. She is denying any nausea vomiting fevers chills night sweats headaches no shortness of breath chest pains or palpitations no myalgias and arthralgias. Remainder of the 10 point review of systems is negative.  MEDICAL HISTORY: Past Medical History  Diagnosis Date  . Breast cancer     Invasive High Grade Carcinoma  . Headache(784.0)     Hx: of Migraines  . Status post chemotherapy     Adriamycin and Cytoxan  . Anxiety     ANXIOUS ABOUT HAVEING BIG SURGERY - MASTECTOMY  . 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 DVT (deep venous thrombosis)     RT ARM-PT ON LOVENOX    ALLERGIES:  has No Known Allergies.  MEDICATIONS:  Current Outpatient Prescriptions  Medication Sig Dispense Refill  . cyclobenzaprine (FLEXERIL) 5 MG tablet TAKE 1 TABLET BY MOUTH THREE TIMES A DAY FOR MUSCLE SPASMS  30 tablet  1  . dexamethasone (DECADRON) 4 MG tablet Take 8 mg by mouth 2 (two) times daily. Take daily for 3 days starting day after chemo      . enoxaparin (LOVENOX) 150 MG/ML injection Inject 1 mL (150  mg total) into the skin daily.  30 Syringe  1  . fentaNYL (DURAGESIC - DOSED MCG/HR) 75 MCG/HR Place 1 patch (75 mcg total) onto the skin every 3 (three) days.  10 patch  0  . ibuprofen (ADVIL,MOTRIN) 200 MG tablet Take 600-800 mg by mouth every 4 (four) hours as needed for mild pain or moderate pain.       Marland Kitchen lidocaine-prilocaine (EMLA) cream Apply 1 application topically every 14 (fourteen) days. Every other Friday prior to chemo      . LORazepam (ATIVAN) 0.5 MG tablet Take 0.5 mg by mouth every 6 (six) hours as needed for anxiety.      Marland Kitchen oxyCODONE (OXY IR/ROXICODONE) 5 MG immediate  release tablet Take 1-3 tablets (5-15 mg total) by mouth every 6 (six) hours as needed for moderate pain.  40 tablet  0  . oxyCODONE (OXY IR/ROXICODONE) 5 MG immediate release tablet Take 1 tablet (5 mg total) by mouth every 4 (four) hours as needed for moderate pain or severe pain.  30 tablet  0  . potassium chloride SA (K-DUR,KLOR-CON) 20 MEQ tablet Take 20 mEq by mouth daily.      Marland Kitchen PRESCRIPTION MEDICATION PT REPORTS RECEIVING A SHOT ONCE A MONTH AT [facility]ER CENTER TO STOP HER MENSTRUAL PERIODS - I SEE DOCUMENTATION IN OFFICE NOTES FROM CANCER CENTER THAT PT WAS GETTING ZOLADEX SQ EVERY 28 DAYS.      Marland Kitchen zolpidem (AMBIEN) 5 MG tablet Take 5 mg by mouth at bedtime as needed for sleep.       No current facility-administered medications for this visit.   Facility-Administered Medications Ordered in Other Visits  Medication Dose Route Frequency Provider Last Rate Last Dose  . goserelin (ZOLADEX) injection 3.6 mg  3.6 mg Subcutaneous Q28 days Victorino December, MD   3.6 mg at 11/13/13 1632    SURGICAL HISTORY:  Past Surgical History  Procedure Laterality Date  . Tubal ligation    . Portacath placement Right 05/22/2013    Procedure: INSERTION PORT-A-CATH;  Surgeon: Emelia Loron, MD;  Location: [facility];  Service: General;  Laterality: Right;  . Left breast needle core biopsy Left 05/21/13  . Left breast needle core  biopsy Left 06/22/13  . Mastectomy modified radical Left 11/15/2013    Procedure: LEFT MASTECTOMY MODIFIED RADICAL;  Surgeon: Emelia Loron, MD;  Location: [facility] ORS;  Service: General;  Laterality: Left;    REVIEW OF SYSTEMS:  A 10 point review of systems was conducted and is otherwise negative except for what is noted above.    PHYSICAL EXAMINATION: Blood pressure 141/69, pulse 105, temperature 98.3 F (36.8 C), temperature source Oral, resp. rate 20, height 5' 5.5" (1.664 m), weight 243 lb 14.4 oz (110.632 kg), last menstrual period 05/30/2013. Body mass index is 39.96  kg/(m^2). General: Patient is a well appearing female in no acute distress HEENT: PERRLA, sclerae anicteric no conjunctival pallor, MMM Neck: supple, no palpable adenopathy Lungs: clear to auscultation bilaterally, no wheezes, rhonchi, or rales Cardiovascular: regular rate rhythm, S1, S2, no murmurs, rubs or gallops Abdomen: Soft, non-tender, non-distended, normoactive bowel sounds, no HSM Extremities: warm and well perfused, no clubbing, cyanosis, or edema Skin: No rashes or lesions Neuro: Non-focal Breasts: left breast mass is much smaller, now hardly palpable, skin change from where mass ulcerated is evident with mild thickness, however lesion is much improved.   ECOG PERFORMANCE STATUS: 1 - Symptomatic but completely ambulatory  LABORATORY DATA: Lab Results  Component Value Date  WBC 6.5 11/26/2013   HGB 9.4* 11/26/2013   HCT 27.5* 11/26/2013   MCV 94.1 11/26/2013   PLT 449* 11/26/2013      Chemistry      Component Value Date/Time   NA 138 11/16/2013 0448   NA 139 10/19/2013 0908   K 3.5 11/16/2013 0448   K 3.3* 10/19/2013 0908   CL 103 11/16/2013 0448   CL 102 06/01/2013 1500   CO2 27 11/16/2013 0448   CO2 24 10/19/2013 0908   BUN 8 11/16/2013 0448   BUN 6.3* 10/19/2013 0908   CREATININE 0.86 11/16/2013 0448   CREATININE 0.9 10/19/2013 0908      Component Value Date/Time   CALCIUM 8.9 11/16/2013 0448   CALCIUM 10.2 10/19/2013 0908   ALKPHOS 201* 10/19/2013 0908   ALKPHOS 111 05/18/2013 1205   AST 39* 10/19/2013 0908   AST 18 05/18/2013 1205   ALT 67* 10/19/2013 0908   ALT 16 05/18/2013 1205   BILITOT <0.20 10/19/2013 0908   BILITOT 0.2* 05/18/2013 1205       RADIOGRAPHIC STUDIES:  Chest 2 View  05/18/2013   *RADIOLOGY REPORT*  Clinical Data: Breast carcinoma  CHEST - 2 VIEW  Comparison: None.  Findings: Probable left breast mass. Lungs clear.  Heart size and pulmonary vascularity normal.  No effusion.  Visualized bones unremarkable.  IMPRESSION: No acute disease   Original Report  Authenticated By: D. Andria Rhein, MD   US Breast Left  05/08/2013   *RADIOLOGY REPORT*  Clinical Data:  42 year old female with large left breast mass - recent punch biopsy demonstrating high-grade carcinoma.  DIGITAL DIAGNOSTIC BILATERAL MAMMOGRAM WITH CAD AND LEFT BREAST ULTRASOUND:  Comparison:  None.  Findings:  ACR Breast Density Category 2: There is a scattered fibroglandular pattern.  A large irregular mass within the upper inner posterior left breast is identified - measuring at least 10 cm. Mild overlying skin thickening is identified compatible with dermal invasion. A 3 x 10 mm cluster of slightly heterogeneous left breast calcifications approximate 4 cm anterior and lateral to this mass is identified.  There is no evidence of suspicious mass, distortion or worrisome calcifications within the right breast. Mammographic images were processed with CAD.  On physical exam, a very large protuberant mass within the upper inner left breast is identified.  Ultrasound is performed, showing a large heterogeneous mass within the upper inner left breast at the 10-11 o'clock position, measuring at least 6.5 cm, but evaluation is limited by transducer skin area and protuberant portion of the mass. A 7 mm level I left axillary lymph node is identified with slightly echogenic rim - suspicious.  IMPRESSION: Large biopsy-proven carcinoma within the upper inner left breast, measuring at least 10 cm mammographically. Mild overlying skin thickening compatible with dermal invasion.  3 x 10 mm cluster of indeterminate left breast calcifications 4 cm anterior and lateral to the biopsy-proven neoplasm.  This may represent DCIS and consider tissue sampling.  Indeterminate 7 mm level I left axillary lymph node - metastatic disease not excluded.  No mammographic evidence of right breast malignancy.  BI-RADS CATEGORY 6:  Known biopsy-proven malignancy - appropriate action should be taken.  RECOMMENDATION: Treatment plan.  Consider  biopsies of the indeterminate left axillary lymph node and left breast calcifications as clinically indicated.  Consider bilateral breast MRI as clinically indicated.  I have discussed the findings and recommendations with the patient. Results were also provided in writing at the conclusion of the visit.   Original Report  Authenticated By: Harmon Pier, M.D.   Mr Breast Bilateral W Wo Contrast  05/22/2013   *RADIOLOGY REPORT*  Clinical Data: large invasive carcinoma left upper inner quadrant with dermal involvement  BILATERAL BREAST MRI WITH AND WITHOUT CONTRAST  Technique: Multiplanar, multisequence MR images of both breasts were obtained prior to and following the intravenous administration of 20ml of Multihance.  Three dimensional images were evaluated at the independent DynaCad workstation.  Comparison:  05/08/13  Findings: There is moderate background parenchymal enhancement. The right breast and axilla are negative.  On the left, there is a large irregular mass with irregular borders in the upper inner quadrant, demonstrating heterogenous internal enhancement.  The mass measures 14 (AP x 7.5 (lateral) x 8 (craniocaudal)cm.  It causes protrusion of the skin of the breast in the 10 - 11 o'clock position, and it is associated with edematous change in the skin of the left breast, primarily in the upper inner quadrant but also in the periareolar in more infra- areolar left breast.  The mass extends through pectoralis major musculature posteriorly, with enhancing mass extending all the way to the posterior edge of the muscle.  In the 2 o'clock position posteriorly there is a 9mm oval enhancing mass with a mixture of plateau and washout kinetics; it is an indeterminate finding but shows the suggestion of a fatty hilum, and it may be an intramammary lymph node.  In the left axilla, there is an enlarged level II lymph node demonstrating hypervascularity and cortical thickening.  It measures 2.5cm in greatest dimension,  and the appearance is concerning for metastasis.  There are no other abnormalities on the left.  IMPRESSION: Large left breast malignancy associated with involvement of the dermis and the chest wall, as well as with probable axillary lymph node metastasis.  There is an indeterminate 9mm mass in the 2 o'clock position, that may be an intramammary lymph node.  BI-RADS CATEGORY 6:  Known biopsy-proven malignancy - appropriate action should be taken.  THREE-DIMENSIONAL MR IMAGE RENDERING ON INDEPENDENT WORKSTATION:  Three-dimensional MR images were rendered by post-processing of the original MR data on an independent workstation.  The three- dimensional MR images were interpreted, and findings were reported in the accompanying complete MRI report for this study.   Original Report Authenticated By: Esperanza Heir, M.D.   Dg Chest Port 1 View  05/22/2013   *RADIOLOGY REPORT*  Clinical Data: Port-A-Cath placement.  PORTABLE CHEST - 1 VIEW  Comparison: 05/18/2013  Findings: Right-sided power injectable Port-A-Cath noted with distal tip projecting over the SVC.  Low lung volumes are present, causing crowding of the pulmonary vasculature.  No pneumothorax noted.  Bilateral midlung subsegmental atelectasis noted.  Borderline cardiomegaly.  IMPRESSION:  1.  Power injectable catheter tip:  SVC.  No pneumothorax.   Original Report Authenticated By: Gaylyn Rong, M.D.   Mm Digital Diagnostic Bilat  05/08/2013   *RADIOLOGY REPORT*  Clinical Data:  42 year old female with large left breast mass - recent punch biopsy demonstrating high-grade carcinoma.  DIGITAL DIAGNOSTIC BILATERAL MAMMOGRAM WITH CAD AND LEFT BREAST ULTRASOUND:  Comparison:  None.  Findings:  ACR Breast Density Category 2: There is a scattered fibroglandular pattern.  A large irregular mass within the upper inner posterior left breast is identified - measuring at least 10 cm. Mild overlying skin thickening is identified compatible with dermal invasion. A 3  x 10 mm cluster of slightly heterogeneous left breast calcifications approximate 4 cm anterior and lateral to this mass is identified.  There is  no evidence of suspicious mass, distortion or worrisome calcifications within the right breast. Mammographic images were processed with CAD.  On physical exam, a very large protuberant mass within the upper inner left breast is identified.  Ultrasound is performed, showing a large heterogeneous mass within the upper inner left breast at the 10-11 o'clock position, measuring at least 6.5 cm, but evaluation is limited by transducer skin area and protuberant portion of the mass. A 7 mm level I left axillary lymph node is identified with slightly echogenic rim - suspicious.  IMPRESSION: Large biopsy-proven carcinoma within the upper inner left breast, measuring at least 10 cm mammographically. Mild overlying skin thickening compatible with dermal invasion.  3 x 10 mm cluster of indeterminate left breast calcifications 4 cm anterior and lateral to the biopsy-proven neoplasm.  This may represent DCIS and consider tissue sampling.  Indeterminate 7 mm level I left axillary lymph node - metastatic disease not excluded.  No mammographic evidence of right breast malignancy.  BI-RADS CATEGORY 6:  Known biopsy-proven malignancy - appropriate action should be taken.  RECOMMENDATION: Treatment plan.  Consider biopsies of the indeterminate left axillary lymph node and left breast calcifications as clinically indicated.  Consider bilateral breast MRI as clinically indicated.  I have discussed the findings and recommendations with the patient. Results were also provided in writing at the conclusion of the visit.   Original Report Authenticated By: Harmon Pier, M.D.   Dg Fluoro Guide Cv Line-no Report  05/22/2013   CLINICAL DATA: surgery   FLOURO GUIDE CV LINE  Fluoroscopy was utilized by the requesting physician.  No radiographic  interpretation.    [place] Lt Breast Bx W Loc Dev 1st Lesion  Img Bx Spec US Guide  05/22/2013   **ADDENDUM** CREATED: 05/22/2013 11:12:58  The patient was called by myself, Dr. Micheline Maze today.  The patient states she is doing well since her left axillary node biopsy without problems at the biopsy site.  The patient was given the results of her biopsy which was compatible with metastatic invasive mammary carcinoma.  This is concordant with imaging findings and patient's known history of recent positive biopsy of a large inner upper left breast mass demonstrating invasive mammary carcinoma. The patient had her breast MRI this morning and will receive Port-A- Cath placement by Dr. Dwain Sarna this afternoon.  Recommend follow- up pending MRI results and per treatment plan.  **END ADDENDUM** SIGNED BY: Melton Alar. Micheline Maze, M.D.  05/21/2013   *RADIOLOGY REPORT*  Clinical Data:  New diagnosis of invasive mammary carcinoma in the left upper inner quadrant.  Abnormal lymph node noted on ultrasound dated 05/08/2013.  ULTRASOUND GUIDED CORE BIOPSY OF THE LEFT AXILLA  The patient and I discussed the procedure of ultrasound-guided biopsy, including benefits and alternatives.  We discussed the high likelihood of a successful procedure. We discussed the risks of the procedure, including infection, bleeding, tissue injury, clip migration, and inadequate sampling.  Written informed consent was given. Appropriate time-out was performed.  Using sterile technique, 2% lidocaine, ultrasound guidance, and a 14 gauge automated biopsy device, biopsy was performed of the abnormal left axillary lymph node using a lateromedial approach. At the conclusion of the procedure, a dumbbell shaped tissue marker clip was deployed into the biopsy cavity. Post biopsy mammogram was not performed.  IMPRESSION: Ultrasound guided biopsy of an abnormal left axillary lymph node. No apparent complications.  Original Report Authenticated By: Cain Saupe, M.D.    ASSESSMENT:   1. Patient is a 42 year old female  with high  grade invasive breast carcinoma ER 14%, PR negative, HER-2/neu negative.  She was evaluated by Dr. Dwain Sarna.      #2 patient had CT and PET scan performed. She has no evidence of metastatic disease distantly.  #3 she was begun on neoadjuvant chemotherapy consisting of Adriamycin Cytoxan given Q2 weekly beginning on 06/08/2013. She completed this regimen, and went on to receive Taxol/Carbo weekly. She received 1 cycle of this and it was discontinued due to progressive neuropathy.  She started Gemzar/Carbo on 8/22. She will receive 6 cycles of this.  Currently on cycle 6 day 8.    #4 anemia due to menorrhagia. She is now status post feraheme given on 06/13/2013.  #5 Pain controlled on Percocet and Fentanyl Patch.   #6 upper extremity DVT: Patient will continue Lovenox. She will finish out a full 6 months in January 2015.  PLAN:   #1 patient will be referred to Dr. Lurline Hare for postmastectomy radiation therapy.  #2 due to to significant amount of residual disease we may plan on giving her Xeloda as a radiosensitizer.  #3 patient will continue Lovenox until January 2015 and then she will discontinue anticoagulation.  #4 patient will be seen back in one month's time for followup  All questions were answered. The patient knows to call the clinic with any problems, questions or concerns. We can certainly see the patient much sooner if necessary.  I spent 25 minutes counseling the patient face to face. The total time spent in the appointment was 30 minutes.    Drue Second, MD Medical/Oncology [facility] [phone] (beeper) [phone] (Office)  11/26/2013, 4:10 PM

## 2013-11-29 ENCOUNTER — Encounter (INDEPENDENT_AMBULATORY_CARE_PROVIDER_SITE_OTHER): Payer: Self-pay | Admitting: General Surgery

## 2013-11-29 ENCOUNTER — Ambulatory Visit (INDEPENDENT_AMBULATORY_CARE_PROVIDER_SITE_OTHER): Payer: PRIVATE HEALTH INSURANCE | Admitting: General Surgery

## 2013-11-29 VITALS — BP 128/82 | HR 74 | Resp 18 | Ht 65.5 in | Wt 240.0 lb

## 2013-11-29 DIAGNOSIS — Z09 Encounter for follow-up examination after completed treatment for conditions other than malignant neoplasm: Secondary | ICD-10-CM

## 2013-11-29 MED ORDER — OXYCODONE HCL 5 MG PO TABS: 5.0000 mg | ORAL_TABLET | ORAL | Status: DC | PRN

## 2013-11-29 MED ORDER — DOXYCYCLINE HYCLATE 100 MG PO TABS: 100.0000 mg | ORAL_TABLET | Freq: Two times a day (BID) | ORAL | Status: DC

## 2013-11-29 NOTE — Progress Notes (Signed)
Subjective:     Patient ID: Erika [name], female   DOB: Jan 08, 1971, 42 y.o.   MRN: [id number]  HPI This is a 42 year old female with a locally advanced left breast cancer underwent primary systemic therapy followed recently by a left modified radical mastectomy. This was difficult due to her size. She comes in today with some pain at the site but is otherwise doing well. Her drains are still putting out over 30 cc each for 24 hours She is back on her Lovenox.Her pain is increased today due to the fact she ran out of her pain medication. She also has some greenish fluid coming out of one of her drains. She denies any fevers or any other symptoms.   Review of Systems     Objective:   Physical Exam Left mastectomy incision with some superficial breakdown at t portion, axillary drain with some greenish fluid although no evidence infection on exam    Assessment:     S/p left mrm     Plan:     We will continue some local wound care at this area. I'm going to continue both drains. I refilled her pain medication. I am going to give her some antibiotics and I will see her again on Monday.

## 2013-12-03 ENCOUNTER — Ambulatory Visit (INDEPENDENT_AMBULATORY_CARE_PROVIDER_SITE_OTHER): Payer: PRIVATE HEALTH INSURANCE | Admitting: General Surgery

## 2013-12-03 ENCOUNTER — Encounter (INDEPENDENT_AMBULATORY_CARE_PROVIDER_SITE_OTHER): Payer: Self-pay | Admitting: General Surgery

## 2013-12-03 VITALS — BP 118/86 | HR 100 | Temp 98.0°F | Resp 15 | Ht 65.5 in | Wt 235.2 lb

## 2013-12-03 DIAGNOSIS — Z09 Encounter for follow-up examination after completed treatment for conditions other than malignant neoplasm: Secondary | ICD-10-CM

## 2013-12-03 MED ORDER — OXYCODONE HCL 5 MG PO TABS: 5.0000 mg | ORAL_TABLET | Freq: Four times a day (QID) | ORAL | Status: DC | PRN

## 2013-12-03 MED ORDER — SULFAMETHOXAZOLE-TRIMETHOPRIM 400-80 MG PO TABS: 1.0000 | ORAL_TABLET | Freq: Two times a day (BID) | ORAL | Status: AC

## 2013-12-03 NOTE — Progress Notes (Signed)
Subjective:     Patient ID: Erika [name], female   DOB: 1971-01-25, 42 y.o.   MRN: [id number]  HPI This is a 42 year old female with a locally advanced left breast cancer underwent primary systemic therapy followed recently by a left modified radical mastectomy.  She comes in today with some pain at the site but is otherwise doing well. Her drains are still putting out over 30 cc each for 24 hours She is back on her Lovenox.She was unable to take the doxycycline due to emesis. She is out of her pain medication again today. She feels better overall.   Review of Systems     Objective:   Physical Exam Left mastectomy incision with some sloughing of the most superficial layer of skin laterally. The drains are all still functional and are draining serous to light greenish appearing fluid. This does not appear to be purulent. There is no infection of her flaps.    Assessment:     Status post left modified radical mastectomy     Plan:    I refilled her pain medication today. I will change her over to Bactrim for a possible infection. I will see her in one week. I will leave both of her drains in place today. She is going to apply triple antibiotic cream to that lateral portion and keep this covered.

## 2013-12-10 ENCOUNTER — Encounter (INDEPENDENT_AMBULATORY_CARE_PROVIDER_SITE_OTHER): Payer: Self-pay | Admitting: General Surgery

## 2013-12-10 ENCOUNTER — Ambulatory Visit (INDEPENDENT_AMBULATORY_CARE_PROVIDER_SITE_OTHER): Payer: PRIVATE HEALTH INSURANCE | Admitting: General Surgery

## 2013-12-10 DIAGNOSIS — Z09 Encounter for follow-up examination after completed treatment for conditions other than malignant neoplasm: Secondary | ICD-10-CM

## 2013-12-10 MED ORDER — OXYCODONE HCL 5 MG PO TABS: 5.0000 mg | ORAL_TABLET | ORAL | Status: DC | PRN

## 2013-12-11 ENCOUNTER — Other Ambulatory Visit: Payer: Self-pay | Admitting: Emergency Medicine

## 2013-12-11 ENCOUNTER — Ambulatory Visit (HOSPITAL_BASED_OUTPATIENT_CLINIC_OR_DEPARTMENT_OTHER): Payer: PRIVATE HEALTH INSURANCE

## 2013-12-11 VITALS — BP 113/77 | HR 104 | Temp 98.8°F

## 2013-12-11 DIAGNOSIS — C50212 Malignant neoplasm of upper-inner quadrant of left female breast: Secondary | ICD-10-CM

## 2013-12-11 DIAGNOSIS — C50219 Malignant neoplasm of upper-inner quadrant of unspecified female breast: Secondary | ICD-10-CM

## 2013-12-11 DIAGNOSIS — Z5111 Encounter for antineoplastic chemotherapy: Secondary | ICD-10-CM

## 2013-12-11 MED ORDER — ENOXAPARIN SODIUM 150 MG/ML ~~LOC~~ SOLN: 150.0000 mg | Freq: Every day | SUBCUTANEOUS | Status: DC

## 2013-12-11 MED ORDER — FENTANYL 75 MCG/HR TD PT72: 75.0000 ug | MEDICATED_PATCH | TRANSDERMAL | Status: DC

## 2013-12-11 MED ORDER — GOSERELIN ACETATE 3.6 MG ~~LOC~~ IMPL
3.6000 mg | DRUG_IMPLANT | SUBCUTANEOUS | Status: DC
  Administered 2013-12-11: 3.6 mg via SUBCUTANEOUS
  Filled 2013-12-11: qty 3.6

## 2013-12-11 NOTE — Progress Notes (Signed)
Subjective:     Patient ID: Erika [name], female   DOB: 1971-04-30, 43 y.o.   MRN: [id number]  HPI This is a 43 year old female with a locally advanced left breast cancer underwent primary systemic therapy followed recently by a left modified radical mastectomy. She comes in today with some pain at the site but is otherwise doing well.  She is back on her Lovenox.She is on bactrim and is able to take once daily.  She is out of her pain medication again today. She feels better overall. She has less than 30 cc out of one drain.  Denies fevers   Review of Systems     Objective:   Physical Exam Left mastectomy incision clean without infection, superficial breakdown of lateral portion, drains with minimal fluid present    Assessment:     S/p left mrm     Plan:     I removed one drain today.  Will continue bactrim.  I will see back in one week.

## 2013-12-17 ENCOUNTER — Ambulatory Visit (INDEPENDENT_AMBULATORY_CARE_PROVIDER_SITE_OTHER): Payer: PRIVATE HEALTH INSURANCE | Admitting: General Surgery

## 2013-12-17 ENCOUNTER — Encounter (INDEPENDENT_AMBULATORY_CARE_PROVIDER_SITE_OTHER): Payer: Self-pay | Admitting: General Surgery

## 2013-12-17 VITALS — BP 132/86 | HR 84 | Temp 99.2°F | Resp 14 | Ht 65.5 in | Wt 225.0 lb

## 2013-12-17 DIAGNOSIS — C50912 Malignant neoplasm of unspecified site of left female breast: Secondary | ICD-10-CM

## 2013-12-17 DIAGNOSIS — C50919 Malignant neoplasm of unspecified site of unspecified female breast: Secondary | ICD-10-CM

## 2013-12-17 DIAGNOSIS — Z09 Encounter for follow-up examination after completed treatment for conditions other than malignant neoplasm: Secondary | ICD-10-CM

## 2013-12-17 MED ORDER — TRAMADOL HCL 50 MG PO TABS: 50.0000 mg | ORAL_TABLET | Freq: Four times a day (QID) | ORAL | Status: DC | PRN

## 2013-12-17 MED ORDER — OXYCODONE HCL 5 MG PO TABS: 5.0000 mg | ORAL_TABLET | Freq: Four times a day (QID) | ORAL | Status: DC | PRN

## 2013-12-17 NOTE — Progress Notes (Signed)
Subjective:     Patient ID: Erika [name], female   DOB: 05/26/1971, 43 y.o.   MRN: [id number]  HPI 43-year-old female with large tumor eventually underwent a left modified radical mastectomy after primary chemotherapy. She has had a slow course recovering. She had some pain issues preoperatively. These are slowly getting better. I removed her last remaining drain today. She has had a little bit of an infection at that drain site as well as some superficial separation in the lateral portion of her mastectomy wound which is healing slowly.  Review of Systems     Objective:   Physical Exam Mastectomy incision without any infection, there is superficial separation of the lateral portion without any infection, drain it is functional with decreased output although she does not measured this    Assessment:     Status post left modified radical mastectomy     Plan:     We're going to do twice daily dressing changes on the lateral portion of this wound. I did remove her drain today. I cautioned her about fluid buildup or any drainage from her wound although I think this will be okay. She's going to continue the antibiotics until they are complete although she can only take these once per day. Plan on seeing her back in one week. We are also trying to get her off of narcotics I gave her prescription for tramadol as well as some OxyContin today with instructions to try to use the tramadol as much as possible at this point. She should also be seen by physical therapy.

## 2013-12-24 ENCOUNTER — Ambulatory Visit: Payer: No Typology Code available for payment source | Admitting: Physical Therapy

## 2013-12-26 ENCOUNTER — Ambulatory Visit
Admission: RE | Admit: 2013-12-26 | Discharge: 2013-12-26 | Disposition: A | Discharge status: Home / Self Care | Payer: No Typology Code available for payment source | Source: Ambulatory Visit | Attending: Radiation Oncology | Admitting: Radiation Oncology

## 2013-12-26 VITALS — BP 122/83 | HR 96 | Temp 98.6°F | Ht 65.5 in | Wt 223.0 lb

## 2013-12-26 DIAGNOSIS — T8189XA Other complications of procedures, not elsewhere classified, initial encounter: Secondary | ICD-10-CM | POA: Insufficient documentation

## 2013-12-26 DIAGNOSIS — Y836 Removal of other organ (partial) (total) as the cause of abnormal reaction of the patient, or of later complication, without mention of misadventure at the time of the procedure: Secondary | ICD-10-CM | POA: Insufficient documentation

## 2013-12-26 DIAGNOSIS — C50219 Malignant neoplasm of upper-inner quadrant of unspecified female breast: Secondary | ICD-10-CM

## 2013-12-26 DIAGNOSIS — R110 Nausea: Secondary | ICD-10-CM | POA: Insufficient documentation

## 2013-12-26 DIAGNOSIS — C50919 Malignant neoplasm of unspecified site of unspecified female breast: Secondary | ICD-10-CM | POA: Insufficient documentation

## 2013-12-26 DIAGNOSIS — L918 Other hypertrophic disorders of the skin: Secondary | ICD-10-CM | POA: Insufficient documentation

## 2013-12-26 DIAGNOSIS — Z9221 Personal history of antineoplastic chemotherapy: Secondary | ICD-10-CM | POA: Insufficient documentation

## 2013-12-26 DIAGNOSIS — Z9010 Acquired absence of unspecified breast and nipple: Secondary | ICD-10-CM | POA: Insufficient documentation

## 2013-12-26 NOTE — Progress Notes (Signed)
Please see the Nurse Progress Note in the MD Initial Consult Encounter for this patient. 

## 2013-12-27 ENCOUNTER — Ambulatory Visit (INDEPENDENT_AMBULATORY_CARE_PROVIDER_SITE_OTHER): Payer: PRIVATE HEALTH INSURANCE | Admitting: General Surgery

## 2013-12-27 ENCOUNTER — Encounter (INDEPENDENT_AMBULATORY_CARE_PROVIDER_SITE_OTHER): Payer: Self-pay | Admitting: General Surgery

## 2013-12-27 VITALS — BP 118/92 | HR 72 | Temp 98.2°F | Resp 16 | Ht 65.5 in | Wt 223.8 lb

## 2013-12-27 DIAGNOSIS — Z09 Encounter for follow-up examination after completed treatment for conditions other than malignant neoplasm: Secondary | ICD-10-CM

## 2013-12-27 NOTE — Progress Notes (Signed)
Subjective:     Patient ID: Erika [name], female   DOB: [birth year], 43 y.o.   MRN: [id number]  HPI 44 year old female with large locally advanced tumor eventually underwent a left modified radical mastectomy after primary chemotherapy. She has had a slow course recovering. She had some pain issues preoperatively. These are slowly getting better. Her wound laterally is much improved today. She has been simulated and hopefully will heal to begin radiation soon.   Review of Systems     Objective:   Physical Exam Left breast incision healed except for laterally with 4x1.5 cm superficially open area without infection    Assessment:     S/p left mrm     Plan:     This area should heal fairly quickly and I think she'll be ready for radiation in a couple of weeks. I will plan on seeing her back in about 4 months. She is going to the [facility] tomorrow. I told her from here on out a primary would be reasonable for her to get the pain medication at the [facility]. She was having the pain issues preoperatively. If not I can continue this but would be better to do at one source and she has trasnportation issues.  I think from her surgical standpoint she could be on a non- narcotic medication at this point. She also missed an appointment to see physical therapy. She is going to call them as I very strongly encouraged her to get back in to see them.

## 2013-12-27 NOTE — Progress Notes (Signed)
Department of Radiation Oncology  Phone:  [phone] Fax:        [phone]   Name: Erika [name] MRN: [id number]  DOB: Nov 07, 1971  Date: 12/26/2013  Follow Up Visit Note  Diagnosis: Stage III left breast cancer  Interval History: Erika [name] presents today for routine followup.  She completed neoadjuvant chemotherapy and actually felt like she had a pretty good clinical response. She stated that her tumor had shrunk down to the size of a pea and her skin head turned back to normal. She underwent a left mastectomy and lymph node dissection on 11/15/2013. 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 The closest margin was deep at 0.2 cm. Lymphovascular invasion was noted. Extensive posttreatment effect within the breast was noted. The skin only showed edema. There was treatment effect in lymph nodes although none were positive and 12 were removed. She has had some pain issues. She has been referred to physical therapy although she is canceled that appointment as she has some nausea. She has some motion of her left arm. She denies any headaches or new bone pain. She is doing dressing changes to the left lateral wound twice a day.  Allergies:  Allergies  Allergen Reactions  . Doxycycline Nausea And Vomiting    Medications:  Current Outpatient Prescriptions  Medication Sig Dispense Refill  . enoxaparin (LOVENOX) 150 MG/ML injection Inject 1 mL (150 mg total) into the skin daily.  30 Syringe  1  . fentaNYL (DURAGESIC - DOSED MCG/HR) 75 MCG/HR Place 1 patch (75 mcg total) onto the skin every 3 (three) days.  10 patch  0  . lidocaine-prilocaine (EMLA) cream Apply 1 application topically every 14 (fourteen) days. Every other Friday prior to chemo      . LORazepam (ATIVAN) 0.5 MG tablet Take 0.5 mg by mouth every 6 (six) hours as needed for anxiety.      Marland Kitchen oxyCODONE (OXY IR/ROXICODONE) 5 MG immediate release tablet Take 1 tablet (5 mg total) by mouth  every 4 (four) hours as needed for moderate pain or severe pain.  30 tablet  0  . oxyCODONE (OXY IR/ROXICODONE) 5 MG immediate release tablet Take 1-2 tablets (5-10 mg total) by mouth every 6 (six) hours as needed for moderate pain.  30 tablet  0  . PRESCRIPTION MEDICATION PT REPORTS RECEIVING A SHOT ONCE A MONTH AT [facility]ER CENTER TO STOP HER MENSTRUAL PERIODS - I SEE DOCUMENTATION IN OFFICE NOTES FROM CANCER CENTER THAT PT WAS GETTING ZOLADEX SQ EVERY 28 DAYS.      Marland Kitchen traMADol (ULTRAM) 50 MG tablet Take 1-2 tablets (50-100 mg total) by mouth every 6 (six) hours as needed.  30 tablet  1  . zolpidem (AMBIEN) 5 MG tablet Take 5 mg by mouth at bedtime as needed for sleep.      . cyclobenzaprine (FLEXERIL) 5 MG tablet TAKE 1 TABLET BY MOUTH THREE TIMES A DAY FOR MUSCLE SPASMS  30 tablet  1  . dexamethasone (DECADRON) 4 MG tablet Take 8 mg by mouth 2 (two) times daily. Take daily for 3 days starting day after chemo      . doxycycline (VIBRA-TABS) 100 MG tablet Take 1 tablet (100 mg total) by mouth 2 (two) times daily.  14 tablet  2  . ibuprofen (ADVIL,MOTRIN) 200 MG tablet Take 600-800 mg by mouth every 4 (four) hours as needed for mild pain or moderate pain.        No current  facility-administered medications for this encounter.   Facility-Administered Medications Ordered in Other Encounters  Medication Dose Route Frequency Provider Last Rate Last Dose  . goserelin (ZOLADEX) injection 3.6 mg  3.6 mg Subcutaneous Q28 days Deatra Robinson, MD   3.6 mg at 11/13/13 1632    Physical Exam:  Filed Vitals:   12/26/13 1441  BP: 122/83  Pulse: 96  Temp: 98.6 F (37 C)  Height: 5' 5.5" (1.664 m)  Weight: 223 lb (101.152 kg)  SpO2: 100%   her mastectomy incision is well healed until lobe mid axillary line where there is an approximate 5 x 3 cm open area with some granulation tissue on the outside. Steri-Strips are still in place over her mastectomy scar. Her drain sites are healing well. She is alert and  oriented x3. She has somewhat diminished range of motion of her left arm. No evidence of lymphedema.  IMPRESSION: Erika [name] is a 43 y.o. female status post neoadjuvant chemotherapy for a locally advanced left breast cancer with residual disease at mastectomy although her lymph nodes were negative now with prolonged wound healing after surgery  PLAN:  I spoke with the patient about the need for radiation for local control. We discussed the process of simulation the placement of tattoos. We discussed 6 weeks of treatment as an outpatient. We discussed the possible use of breath hold technique for cardiac sparing. We discussed treatment of her left chest wall and her supraclavicular lymph nodes. We discussed skin darkening and fatigue as possible side effects. We discussed increased complications with reconstruction after radiation. She has about 2 more weeks ago in terms of healing so I set her up with Dr. Isidore Moos the first week of February with simulation to follow.    Thea Silversmith, MD

## 2013-12-28 ENCOUNTER — Ambulatory Visit (HOSPITAL_BASED_OUTPATIENT_CLINIC_OR_DEPARTMENT_OTHER): Payer: PRIVATE HEALTH INSURANCE

## 2013-12-28 ENCOUNTER — Telehealth: Payer: Self-pay | Admitting: Oncology

## 2013-12-28 ENCOUNTER — Other Ambulatory Visit (HOSPITAL_BASED_OUTPATIENT_CLINIC_OR_DEPARTMENT_OTHER): Payer: PRIVATE HEALTH INSURANCE

## 2013-12-28 ENCOUNTER — Ambulatory Visit (HOSPITAL_BASED_OUTPATIENT_CLINIC_OR_DEPARTMENT_OTHER): Payer: PRIVATE HEALTH INSURANCE | Admitting: Adult Health

## 2013-12-28 ENCOUNTER — Encounter: Payer: Self-pay | Admitting: Adult Health

## 2013-12-28 VITALS — BP 109/75 | HR 80 | Temp 97.4°F | Resp 18 | Ht 65.5 in | Wt 221.4 lb

## 2013-12-28 DIAGNOSIS — C50219 Malignant neoplasm of upper-inner quadrant of unspecified female breast: Secondary | ICD-10-CM

## 2013-12-28 DIAGNOSIS — Z95828 Presence of other vascular implants and grafts: Secondary | ICD-10-CM

## 2013-12-28 DIAGNOSIS — N920 Excessive and frequent menstruation with regular cycle: Secondary | ICD-10-CM

## 2013-12-28 DIAGNOSIS — Z7901 Long term (current) use of anticoagulants: Secondary | ICD-10-CM

## 2013-12-28 DIAGNOSIS — Z452 Encounter for adjustment and management of vascular access device: Secondary | ICD-10-CM

## 2013-12-28 DIAGNOSIS — I82409 Acute embolism and thrombosis of unspecified deep veins of unspecified lower extremity: Secondary | ICD-10-CM

## 2013-12-28 DIAGNOSIS — D500 Iron deficiency anemia secondary to blood loss (chronic): Secondary | ICD-10-CM

## 2013-12-28 LAB — CBC WITH DIFFERENTIAL/PLATELET
BASO%: 0.2 % (ref 0.0–2.0)
Basophils Absolute: 0 10*3/uL (ref 0.0–0.1)
EOS%: 5.9 % (ref 0.0–7.0)
Eosinophils Absolute: 0.3 10*3/uL (ref 0.0–0.5)
HCT: 33.7 % — ABNORMAL LOW (ref 34.8–46.6)
HGB: 11.1 g/dL — ABNORMAL LOW (ref 11.6–15.9)
LYMPH%: 39.1 % (ref 14.0–49.7)
MCH: 29.2 pg (ref 25.1–34.0)
MCHC: 33 g/dL (ref 31.5–36.0)
MCV: 88.3 fL (ref 79.5–101.0)
MONO#: 0.3 10*3/uL (ref 0.1–0.9)
MONO%: 4.9 % (ref 0.0–14.0)
NEUT#: 2.7 10*3/uL (ref 1.5–6.5)
NEUT%: 49.9 % (ref 38.4–76.8)
Platelets: 335 10*3/uL (ref 145–400)
RBC: 3.82 10*6/uL (ref 3.70–5.45)
RDW: 16.2 % — AB (ref 11.2–14.5)
WBC: 5.5 10*3/uL (ref 3.9–10.3)
lymph#: 2.1 10*3/uL (ref 0.9–3.3)

## 2013-12-28 LAB — COMPREHENSIVE METABOLIC PANEL (CC13)
ALK PHOS: 98 U/L (ref 40–150)
ALT: 15 U/L (ref 0–55)
ANION GAP: 10 meq/L (ref 3–11)
AST: 20 U/L (ref 5–34)
Albumin: 3.3 g/dL — ABNORMAL LOW (ref 3.5–5.0)
BUN: 9.1 mg/dL (ref 7.0–26.0)
CO2: 28 meq/L (ref 22–29)
Calcium: 10 mg/dL (ref 8.4–10.4)
Chloride: 101 mEq/L (ref 98–109)
Creatinine: 0.8 mg/dL (ref 0.6–1.1)
Glucose: 113 mg/dl (ref 70–140)
POTASSIUM: 3.3 meq/L — AB (ref 3.5–5.1)
SODIUM: 139 meq/L (ref 136–145)
TOTAL PROTEIN: 8.2 g/dL (ref 6.4–8.3)
Total Bilirubin: 0.2 mg/dL (ref 0.20–1.20)

## 2013-12-28 MED ORDER — HEPARIN SOD (PORK) LOCK FLUSH 100 UNIT/ML IV SOLN
500.0000 [IU] | Freq: Once | INTRAVENOUS | Status: AC
  Administered 2013-12-28: 500 [IU] via INTRAVENOUS
  Filled 2013-12-28: qty 5

## 2013-12-28 MED ORDER — SODIUM CHLORIDE 0.9 % IJ SOLN
10.0000 mL | INTRAMUSCULAR | Status: DC | PRN
  Administered 2013-12-28: 10 mL via INTRAVENOUS
  Filled 2013-12-28: qty 10

## 2013-12-28 MED ORDER — OXYCODONE HCL 5 MG PO TABS: 5.0000 mg | ORAL_TABLET | ORAL | Status: DC | PRN

## 2013-12-28 NOTE — Addendum Note (Signed)
Encounter addended by: Tykeria Wawrzyniak Mintz Toya Palacios, RN on: 12/28/2013  4:57 PM<BR>     Documentation filed: Charges VN

## 2013-12-28 NOTE — Patient Instructions (Signed)

## 2013-12-28 NOTE — Progress Notes (Signed)
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    PRIOR THERAPY: 1. 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 2 months prior this began getting larger rapidly. She was seen in an urgent care where this mass was thought to be an abscess. Apparently there was an attempt to excise or drain this that was not successful and she was referred to Dr. [name] for evaluation. She underwent punch biopsy on 04/23/13 of her skin and the mass. She was then also sent for mm/us. The biopsy shows invasive high grade carcinoma that appears to be breast. It is ER 14%, PR negative, HER-2/neu negative with a Ki-67 that approaches 100%. She has also undergone mm with at least 10 cm upper inner left breast tumor with mild overyling skin thickening. There is also a 3x10 mm cluster of calcs near there also. There is indeterminate 7 mm level 1 left axillary node, she underwent a lymph node biopsy that was positive for disease. No mm evidence of right breast malignancy.  An MRI of the breasts was done on 05/22/13 and showed a 14 cm mass in the left breast protruding from the skin and also a 2.5 cm level II axillary node.   2.  Patient evaluated and began on neoadjuvant chemotherapy.  Adriamycin and Cytoxan began on 05/25/13.  She completed 4 cycles with Neulasta support.   Taxol carbo began on 8/8. 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  #3 staging PET/CT performed on 06/13/2013 revealed no evidence of distant disease.  #4 anemia of iron deficiency.  She has received parenteral iron in the past.  This was associated with menorrhagia, the patient has since been receiving Zoladex with great improvement of her hemoglobin and iron studies.   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 Thea Silversmith  INTERVAL HISTORY: Erika [name] 43 y.o. female returns for followup visit today. She underwent mastectomy followed by evaluation for radiation therapy.  She did have a right chest wall wound, that needs to heal prior to receiving radiation therapy.  She was seen by Dr. Humphrey Rolls post surgery as well who recommended concurrent Xeloda with radiation therapy.  Her radiation is scheduled to begin at the end of February.  She is doing well today.  Her wound is continuing to heal and she is requesting a refill of her pain medication for her wound and pain associated with it.  She has also continued on Lovenox for a previous right upper extremity DVT and has tolerated it quite well.  She denies easy bleeding, bruising or any other problems with it.  She denies fevers, chills, drainage from her wound, or any other concerns.    MEDICAL HISTORY: Past Medical History  Diagnosis Date  . Breast cancer     Invasive High Grade Carcinoma  . Headache(784.0)     Hx: of Migraines  . Status post chemotherapy     Adriamycin and Cytoxan  . Anxiety     ANXIOUS ABOUT HAVEING BIG SURGERY - MASTECTOMY  . 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 DVT (deep venous thrombosis)     RT ARM-PT ON LOVENOX    ALLERGIES:  is allergic to doxycycline.  MEDICATIONS:  Current Outpatient Prescriptions  Medication Sig Dispense Refill  . cyclobenzaprine (FLEXERIL) 5 MG tablet TAKE 1 TABLET BY MOUTH THREE TIMES A DAY FOR MUSCLE SPASMS  30 tablet  1  . enoxaparin (LOVENOX) 150 MG/ML injection Inject 1 mL (150 mg total) into the skin daily.  30 Syringe  1  . fentaNYL (DURAGESIC - DOSED MCG/HR) 75 MCG/HR Place 1 patch (75 mcg total) onto the skin every 3 (three) days.  10 patch  0  . ibuprofen (ADVIL,MOTRIN) 200 MG tablet Take 600-800 mg by mouth every 4 (four) hours as needed for mild pain or moderate pain.       Marland Kitchen  lidocaine-prilocaine (EMLA) cream Apply 1 application topically every 14 (fourteen) days. Every other Friday prior to chemo      . LORazepam (ATIVAN) 0.5 MG tablet Take 0.5 mg by mouth every 6 (six) hours as needed for anxiety.      Marland Kitchen oxyCODONE (OXY IR/ROXICODONE) 5 MG immediate release tablet Take 1 tablet (5 mg total) by mouth every 4 (four) hours as needed for moderate pain or severe pain.  30 tablet  0  . PRESCRIPTION MEDICATION PT REPORTS RECEIVING A SHOT ONCE A MONTH AT [facility]ER [facility] TO STOP HER MENSTRUAL PERIODS - I SEE DOCUMENTATION IN OFFICE NOTES FROM CANCER CENTER THAT PT WAS GETTING ZOLADEX SQ EVERY 28 DAYS.      Marland Kitchen traMADol (ULTRAM) 50 MG tablet Take 1-2 tablets (50-100 mg total) by mouth every 6 (six) hours as needed.  30 tablet  1  . dexamethasone (DECADRON) 4 MG tablet Take 8 mg by mouth 2 (two) times daily. Take daily for 3 days starting day after chemo      . zolpidem (AMBIEN) 5 MG tablet Take 5 mg by mouth at bedtime as needed for sleep.       No current facility-administered medications for this visit.    SURGICAL HISTORY:  Past Surgical History  Procedure Laterality Date  . Tubal ligation    . Portacath placement Right 05/22/2013    Procedure: INSERTION PORT-A-CATH;  Surgeon: [name], MD;  Location: [facility];  Service: General;  Laterality: Right;  . Left breast needle core biopsy Left 05/21/13  . Left breast needle core  biopsy Left 06/22/13  . Mastectomy modified radical Left 11/15/2013    Procedure: LEFT MASTECTOMY MODIFIED RADICAL;  Surgeon: [name], MD;  Location: [facility];  Service: General;  Laterality: Left;    REVIEW OF SYSTEMS:  A 10 point review of systems was conducted and is otherwise negative except for what is noted above.    PHYSICAL EXAMINATION: Blood pressure 109/75, pulse 80, temperature 97.4 F (36.3 C), temperature source Oral, resp. rate 18, height 5' 5.5" (1.664 m), weight 221 lb 6.4 oz (100.426 kg), last menstrual period 05/30/2013.  Body mass index is 36.27 kg/(m^2). GENERAL: Patient is a well appearing female in no acute distress HEENT:  Sclerae anicteric.  Oropharynx clear and moist. No ulcerations or evidence of oropharyngeal candidiasis. Neck is supple.  NODES:  No cervical, supraclavicular, or axillary lymphadenopathy palpated.  BREAST EXAM: left chest wall incision almost completely healed, there is an approximate 4x2cm laterally that is open, no drainage or any sign of infection LUNGS:  Clear to auscultation bilaterally.  No wheezes or rhonchi. HEART:  Regular rate and rhythm. No murmur appreciated. ABDOMEN:  Soft, nontender.  Positive, normoactive bowel sounds. No organomegaly palpated. MSK:  No focal spinal tenderness to palpation. Full  range of motion bilaterally in the upper extremities. EXTREMITIES:  No peripheral edema.   SKIN:  Clear with no obvious rashes or skin changes. No nail dyscrasia. NEURO:  Nonfocal. Well oriented.  Appropriate affect. ECOG PERFORMANCE STATUS: 1 - Symptomatic but completely ambulatory  LABORATORY DATA: Lab Results  Component Value Date   WBC 5.5 12/28/2013   HGB 11.1* 12/28/2013   HCT 33.7* 12/28/2013   MCV 88.3 12/28/2013   PLT 335 12/28/2013      Chemistry      Component Value Date/Time   NA 139 12/28/2013 1506   NA 138 11/16/2013 0448   K 3.3* 12/28/2013 1506   K 3.5 11/16/2013 0448   CL 103 11/16/2013 0448   CL 102 06/01/2013 1500   CO2 28 12/28/2013 1506   CO2 27 11/16/2013 0448   BUN 9.1 12/28/2013 1506   BUN 8 11/16/2013 0448   CREATININE 0.8 12/28/2013 1506   CREATININE 0.86 11/16/2013 0448      Component Value Date/Time   CALCIUM 10.0 12/28/2013 1506   CALCIUM 8.9 11/16/2013 0448   ALKPHOS 98 12/28/2013 1506   ALKPHOS 111 05/18/2013 1205   AST 20 12/28/2013 1506   AST 18 05/18/2013 1205   ALT 15 12/28/2013 1506   ALT 16 05/18/2013 1205   BILITOT 0.20 12/28/2013 1506   BILITOT 0.2* 05/18/2013 1205       RADIOGRAPHIC STUDIES:  Chest 2 View  05/18/2013   *RADIOLOGY REPORT*   Clinical Data: Breast carcinoma  CHEST - 2 VIEW  Comparison: None.  Findings: Probable left breast mass. Lungs clear.  Heart size and pulmonary vascularity normal.  No effusion.  Visualized bones unremarkable.  IMPRESSION: No acute disease   Original Report Authenticated By: D. Wallace Going, MD   US Breast Left  05/08/2013   *RADIOLOGY REPORT*  Clinical Data:  43 year old female with large left breast mass - recent punch biopsy demonstrating high-grade carcinoma.  DIGITAL DIAGNOSTIC BILATERAL MAMMOGRAM WITH CAD AND LEFT BREAST ULTRASOUND:  Comparison:  None.  Findings:  ACR Breast Density Category 2: There is a scattered fibroglandular pattern.  A large irregular mass within the upper inner posterior left breast is identified - measuring at least 10 cm. Mild overlying skin thickening is identified compatible with dermal invasion. A 3 x 10 mm cluster of slightly heterogeneous left breast calcifications approximate 4 cm anterior and lateral to this mass is identified.  There is no evidence of suspicious mass, distortion or worrisome calcifications within the right breast. Mammographic images were processed with CAD.  On physical exam, a very large protuberant mass within the upper inner left breast is identified.  Ultrasound is performed, showing a large heterogeneous mass within the upper inner left breast at the 10-11 o'clock position, measuring at least 6.5 cm, but evaluation is limited by transducer skin area and protuberant portion of the mass. A 7 mm level I left axillary lymph node is identified with slightly echogenic rim - suspicious.  IMPRESSION: Large biopsy-proven carcinoma within the upper inner left breast, measuring at least 10 cm mammographically. Mild overlying skin thickening compatible with dermal invasion.  3 x 10 mm cluster of indeterminate left breast calcifications 4 cm anterior and lateral to the biopsy-proven neoplasm.  This may represent DCIS and consider tissue sampling.  Indeterminate 7  mm level I left axillary lymph node - metastatic disease not excluded.  No mammographic evidence of right breast malignancy.  BI-RADS CATEGORY 6:  Known biopsy-proven malignancy - appropriate action should be taken.  RECOMMENDATION: Treatment plan.  Consider biopsies of the indeterminate left axillary lymph node and left breast calcifications as clinically indicated.  Consider bilateral breast MRI as clinically indicated.  I have discussed the findings and recommendations with the patient. Results were also provided in writing at the conclusion of the visit.   Original Report Authenticated By: Margarette Canada, M.D.   Mr Breast Bilateral W Wo Contrast  05/22/2013   *RADIOLOGY REPORT*  Clinical Data: large invasive carcinoma left upper inner quadrant with dermal involvement  BILATERAL BREAST MRI WITH AND WITHOUT CONTRAST  Technique: Multiplanar, multisequence MR images of both breasts were obtained prior to and following the intravenous administration of 55m of Multihance.  Three dimensional images were evaluated at the independent DynaCad workstation.  Comparison:  05/08/13  Findings: There is moderate background parenchymal enhancement. The right breast and axilla are negative.  On the left, there is a large irregular mass with irregular borders in the upper inner quadrant, demonstrating heterogenous internal enhancement.  The mass measures 14 (AP x 7.5 (lateral) x 8 (craniocaudal)cm.  It causes protrusion of the skin of the breast in the 10 - 11 o'clock position, and it is associated with edematous change in the skin of the left breast, primarily in the upper inner quadrant but also in the periareolar in more infra- areolar left breast.  The mass extends through pectoralis major musculature posteriorly, with enhancing mass extending all the way to the posterior edge of the muscle.  In the 2 o'clock position posteriorly there is a 964moval enhancing mass with a mixture of plateau and washout kinetics; it is an  indeterminate finding but shows the suggestion of a fatty hilum, and it may be an intramammary lymph node.  In the left axilla, there is an enlarged level II lymph node demonstrating hypervascularity and cortical thickening.  It measures 2.5cm in greatest dimension, and the appearance is concerning for metastasis.  There are no other abnormalities on the left.  IMPRESSION: Large left breast malignancy associated with involvement of the dermis and the chest wall, as well as with probable axillary lymph node metastasis.  There is an indeterminate 51m70mass in the 2 o'clock position, that may be an intramammary lymph node.  BI-RADS CATEGORY 6:  Known biopsy-proven malignancy - appropriate action should be taken.  THREE-DIMENSIONAL MR IMAGE RENDERING ON INDEPENDENT WORKSTATION:  Three-dimensional MR images were rendered by post-processing of the original MR data on an independent workstation.  The three- dimensional MR images were interpreted, and findings were reported in the accompanying complete MRI report for this study.   Original Report Authenticated By: RaySkipper Cliche.D.   Dg Chest Port 1 View  05/22/2013   *RADIOLOGY REPORT*  Clinical Data: Port-A-Cath placement.  PORTABLE CHEST - 1 VIEW  Comparison: 05/18/2013  Findings: Right-sided power injectable Port-A-Cath noted with distal tip projecting over the SVC.  Low lung volumes are present, causing crowding of the pulmonary vasculature.  No pneumothorax noted.  Bilateral midlung subsegmental atelectasis noted.  Borderline cardiomegaly.  IMPRESSION:  1.  Power injectable catheter tip:  SVC.  No pneumothorax.   Original Report Authenticated By: WalVan Clines.D.   Mm Digital Diagnostic Bilat  05/08/2013   *RADIOLOGY REPORT*  Clinical Data:  [age]ar old female with large left breast mass - recent punch biopsy demonstrating high-grade carcinoma.  DIGITAL DIAGNOSTIC BILATERAL MAMMOGRAM WITH CAD AND LEFT BREAST ULTRASOUND:  Comparison:  None.  Findings:   ACR Breast Density Category 2: There is a scattered fibroglandular pattern.  A large irregular mass within the upper inner posterior left breast is identified - measuring at least 10 cm. Mild overlying skin thickening is identified compatible with dermal invasion. A 3 x 10 mm cluster of slightly heterogeneous left breast calcifications approximate 4 cm anterior and lateral to this mass is identified.  There is no evidence of suspicious mass, distortion or worrisome calcifications within the right breast. Mammographic images were processed with CAD.  On physical exam, a very large protuberant mass within the upper inner left breast is identified.  Ultrasound is performed, showing a large heterogeneous mass within the upper inner left breast at the 10-11 o'clock position, measuring at least 6.5 cm, but evaluation is limited by transducer skin area and protuberant portion of the mass. A 7 mm level I left axillary lymph node is identified with slightly echogenic rim - suspicious.  IMPRESSION: Large biopsy-proven carcinoma within the upper inner left breast, measuring at least 10 cm mammographically. Mild overlying skin thickening compatible with dermal invasion.  3 x 10 mm cluster of indeterminate left breast calcifications 4 cm anterior and lateral to the biopsy-proven neoplasm.  This may represent DCIS and consider tissue sampling.  Indeterminate 7 mm level I left axillary lymph node - metastatic disease not excluded.  No mammographic evidence of right breast malignancy.  BI-RADS CATEGORY 6:  Known biopsy-proven malignancy - appropriate action should be taken.  RECOMMENDATION: Treatment plan.  Consider biopsies of the indeterminate left axillary lymph node and left breast calcifications as clinically indicated.  Consider bilateral breast MRI as clinically indicated.  I have discussed the findings and recommendations with the patient. Results were also provided in writing at the conclusion of the visit.   Original  Report Authenticated By: Margarette Canada, M.D.   Dg Fluoro Guide Cv Line-no Report  05/22/2013   CLINICAL DATA: surgery   FLOURO GUIDE CV LINE  Fluoroscopy was utilized by the requesting physician.  No radiographic  interpretation.    [place] Lt Breast Bx W Loc Dev 1st Lesion Img Bx Spec US Guide  05/22/2013   **ADDENDUM** CREATED: 05/22/2013 11:12:58  The patient was called by myself, Dr. Derrel Nip today.  The patient states she is doing well since her left axillary node biopsy without problems at the biopsy site.  The patient was given the results of her biopsy which was compatible with metastatic invasive mammary carcinoma.  This is concordant with imaging findings and patient's known history of recent positive biopsy of a large inner upper left breast mass demonstrating invasive mammary carcinoma. The patient had her breast MRI this morning and will receive Port-A- Cath placement by Dr. [name] this afternoon.  Recommend follow- up pending MRI results and per treatment plan.  **END ADDENDUM** SIGNED BY: Melene Plan. Derrel Nip, M.D.  05/21/2013   *RADIOLOGY REPORT*  Clinical Data:  New diagnosis of invasive mammary carcinoma in the left upper inner quadrant.  Abnormal lymph node noted on ultrasound dated 05/08/2013.  ULTRASOUND GUIDED CORE BIOPSY OF THE LEFT AXILLA  The patient and I discussed the procedure of ultrasound-guided biopsy, including benefits and alternatives.  We discussed the high likelihood of a successful procedure. We discussed the risks of the procedure, including infection, bleeding, tissue injury, clip migration, and inadequate sampling.  Written informed consent was given. Appropriate time-out was performed.  Using sterile technique, 2% lidocaine, ultrasound guidance, and a 14 gauge automated biopsy device, biopsy was performed of the abnormal left axillary lymph node using a lateromedial approach. At the conclusion of the procedure,  a dumbbell shaped tissue marker clip was deployed into the biopsy cavity.  Post biopsy mammogram was not performed.  IMPRESSION: Ultrasound guided biopsy of an abnormal left axillary lymph node. No apparent complications.  Original Report Authenticated By: Ulyess Blossom, M.D.    ASSESSMENT:   1. Patient is a 43 year old female with high grade invasive breast carcinoma ER 14%, PR negative, HER-2/neu negative.  She was evaluated by Dr. [name].      #2 patient had CT and PET scan performed. She has no evidence of metastatic disease distantly.  #3 s/p neoadjuvant chemotherapy consisting of Adriamycin Cytoxan given Q2 weekly beginning on 06/08/2013. She completed this regimen, and went on to receive Taxol/Carbo weekly. She received 1 cycle of this and it was discontinued due to progressive neuropathy.  She started Gemzar/Carbo on 8/22. She will receive 6 cycles of this.    #4 anemia due to menorrhagia. She is now status post feraheme given on 06/13/2013.  #5 Pain controlled on Percocet and Fentanyl Patch.   #6 upper extremity DVT: Patient will continue Lovenox. She will finish out a full 6 months in January 2015.  PLAN:   #1 Ms. Hannig is doing well today.  Her wound is healing very well.  We discussed her pain and I will refill her Oxycodone for her today.  She will continue to receive Zoladex.   #2 I discussed her plan for radiation therapy with concurrent Xeloda.  We discussed the indications, risks, benefits, adverse effects.  I gave her detailed information about Xeloda in her AVS.  She will return on 01/21/14 prior to beginning radiation therapy and I will prescribe it.    #3 She has 10-15 Lovenox syringes left.  She will finish the Lovenox injections that she has now.  Once complete, she will be finished with anticoagulant therapy.    #4  I will check her iron studies with her next set of labs.    #5 We will see the patient back on 01/21/14 to start Xeloda therapy, and for evaluation and follow up.   All questions were answered. The patient knows to call the  clinic with any problems, questions or concerns. We can certainly see the patient much sooner if necessary.  I spent 25 minutes counseling the patient face to face. The total time spent in the appointment was 30 minutes.   Minette Headland, [place] [phone] 01/01/2014, 8:47 AM

## 2013-12-28 NOTE — Patient Instructions (Signed)
Capecitabine tablets What is this medicine? CAPECITABINE (ka pe SITE a been) is a chemotherapy drug. It slows the growth of cancer cells. This medicine is used to treat breast cancer, and also colon or rectal cancer. This medicine may be used for other purposes; ask your health care provider or pharmacist if you have questions. COMMON BRAND NAME(S): Xeloda What should I tell my health care provider before I take this medicine? They need to know if you have any of these conditions: -bleeding or blood disorders -dihydropyrimidine dehydrogenase (DPD) deficiency -heart disease -infection (especially a virus infection such as chickenpox, cold sores, or herpes) -kidney disease -liver disease -an unusual or allergic reaction to capecitabine, 5-fluorouracil, other medicines, foods, dyes, or preservatives -pregnant or trying to get pregnant -breast-feeding How should I use this medicine? Take this medicine by mouth with a glass of water, within 30 minutes of the end of a meal. Follow the directions on the prescription label. Take your medicine at regular intervals. Do not take it more often than directed. Do not stop taking except on your doctor's advice. Your doctor may want you to take a combination of 150 mg and 500 mg tablets for each dose. It is very important that you know how to correctly take your dose. Taking the wrong tablets could result in an overdose (too much medication) or underdose (too little medication). Talk to your pediatrician regarding the use of this medicine in children. Special care may be needed. Overdosage: If you think you have taken too much of this medicine contact a poison control center or emergency room at once. NOTE: This medicine is only for you. Do not share this medicine with others. What if I miss a dose? If you miss a dose, do not take the missed dose at all. Do not take double or extra doses. Instead, continue with your next scheduled dose and check with your  doctor. What may interact with this medicine? -antacids with aluminum and/or magnesium -folic acid -leucovorin -medicines to increase blood counts like filgrastim, pegfilgrastim, sargramostim -phenytoin -vaccines -warfarin Talk to your doctor or health care professional before taking any of these medicines: -acetaminophen -aspirin -ibuprofen -ketoprofen -naproxen This list may not describe all possible interactions. Give your health care provider a list of all the medicines, herbs, non-prescription drugs, or dietary supplements you use. Also tell them if you smoke, drink alcohol, or use illegal drugs. Some items may interact with your medicine. What should I watch for while using this medicine? Visit your doctor for checks on your progress. This drug may make you feel generally unwell. This is not uncommon, as chemotherapy can affect healthy cells as well as cancer cells. Report any side effects. Continue your course of treatment even though you feel ill unless your doctor tells you to stop. In some cases, you may be given additional medicines to help with side effects. Follow all directions for their use. Call your doctor or health care professional for advice if you get a fever, chills or sore throat, or other symptoms of a cold or flu. Do not treat yourself. This drug decreases your body's ability to fight infections. Try to avoid being around people who are sick. This medicine may increase your risk to bruise or bleed. Call your doctor or health care professional if you notice any unusual bleeding. Be careful brushing and flossing your teeth or using a toothpick because you may get an infection or bleed more easily. If you have any dental work done, tell your   dentist you are receiving this medicine. Avoid taking products that contain aspirin, acetaminophen, ibuprofen, naproxen, or ketoprofen unless instructed by your doctor. These medicines may hide a fever. Do not become pregnant while  taking this medicine. Women should inform their doctor if they wish to become pregnant or think they might be pregnant. There is a potential for serious side effects to an unborn child. Talk to your health care professional or pharmacist for more information. Do not breast-feed an infant while taking this medicine. Men are advised not to father a child while taking this medicine. What side effects may I notice from receiving this medicine? Side effects that you should report to your doctor or health care professional as soon as possible: -allergic reactions like skin rash, itching or hives, swelling of the face, lips, or tongue -low blood counts - this medicine may decrease the number of white blood cells, red blood cells and platelets. You may be at increased risk for infections and bleeding. -signs of infection - fever or chills, cough, sore throat, pain or difficulty passing urine -signs of decreased platelets or bleeding - bruising, pinpoint red spots on the skin, black, tarry stools, blood in the urine -signs of decreased red blood cells - unusually weak or tired, fainting spells, lightheadedness -breathing problems -changes in vision -chest pain -diarrhea of more than 4 bowel movements in one day or any diarrhea at night -mouth sores -nausea and vomiting -pain, swelling, redness at site where injected -pain, tingling, numbness in the hands or feet -redness, swelling, or sores on hands or feet -stomach pain -vomiting -yellow color of skin or eyes Side effects that usually do not require medical attention (report to your doctor or health care professional if they continue or are bothersome): -constipation -diarrhea -dry or itchy skin -hair loss -loss of appetite -nausea -weak or tired This list may not describe all possible side effects. Call your doctor for medical advice about side effects. You may report side effects to FDA at 1-800-FDA-1088. Where should I keep my  medicine? Keep out of the reach of children. Store at room temperature between 15 and 30 degrees C (59 and 86 degrees F). Keep container tightly closed. Throw away any unused medicine after the expiration date. NOTE: This sheet is a summary. It may not cover all possible information. If you have questions about this medicine, talk to your doctor, pharmacist, or health care provider.  2014, [employer] Standard. (2008-11-11 11:47:41)   

## 2014-01-01 ENCOUNTER — Ambulatory Visit: Payer: No Typology Code available for payment source | Attending: General Surgery | Admitting: Physical Therapy

## 2014-01-01 DIAGNOSIS — M24519 Contracture, unspecified shoulder: Secondary | ICD-10-CM | POA: Insufficient documentation

## 2014-01-01 DIAGNOSIS — M25519 Pain in unspecified shoulder: Secondary | ICD-10-CM | POA: Insufficient documentation

## 2014-01-01 DIAGNOSIS — IMO0001 Reserved for inherently not codable concepts without codable children: Secondary | ICD-10-CM | POA: Insufficient documentation

## 2014-01-03 ENCOUNTER — Ambulatory Visit: Payer: No Typology Code available for payment source | Admitting: Physical Therapy

## 2014-01-03 ENCOUNTER — Ambulatory Visit: Payer: No Typology Code available for payment source

## 2014-01-03 NOTE — Progress Notes (Signed)
[facility] Psychosocial Distress Screening Clinical Social Work  Clinical Social Work was referred by distress screening protocol.  The patient scored a 8 on the Psychosocial Distress Thermometer which indicates severe distress. Clinical Social Worker Intern phoned to assess for distress and other psychosocial needs. Patient stated that everything was "okay".  Patient said that she verbalized health concerns/questions to doctors and felt that she was getting the information she needed.  Patient was made aware of services and agrees to seek out further assistance if needed.   Clinical Social Worker follow up needed: no  If yes, follow up plan:   Kyshaun Barnette S. [place] Work Intern [employer] [phone]

## 2014-01-07 ENCOUNTER — Other Ambulatory Visit: Payer: Self-pay | Admitting: Emergency Medicine

## 2014-01-07 DIAGNOSIS — C50219 Malignant neoplasm of upper-inner quadrant of unspecified female breast: Secondary | ICD-10-CM

## 2014-01-07 MED ORDER — OXYCODONE HCL 5 MG PO TABS: 5.0000 mg | ORAL_TABLET | ORAL | Status: DC | PRN

## 2014-01-07 MED ORDER — FENTANYL 75 MCG/HR TD PT72: 75.0000 ug | MEDICATED_PATCH | TRANSDERMAL | Status: DC

## 2014-01-08 ENCOUNTER — Other Ambulatory Visit: Payer: Self-pay | Admitting: Adult Health

## 2014-01-08 ENCOUNTER — Ambulatory Visit (HOSPITAL_BASED_OUTPATIENT_CLINIC_OR_DEPARTMENT_OTHER): Payer: PRIVATE HEALTH INSURANCE

## 2014-01-08 ENCOUNTER — Other Ambulatory Visit: Payer: Self-pay

## 2014-01-08 ENCOUNTER — Ambulatory Visit: Payer: No Typology Code available for payment source | Admitting: Physical Therapy

## 2014-01-08 VITALS — BP 119/82 | HR 79 | Temp 97.7°F

## 2014-01-08 DIAGNOSIS — C50219 Malignant neoplasm of upper-inner quadrant of unspecified female breast: Secondary | ICD-10-CM

## 2014-01-08 DIAGNOSIS — Z5111 Encounter for antineoplastic chemotherapy: Secondary | ICD-10-CM

## 2014-01-08 MED ORDER — GOSERELIN ACETATE 3.6 MG ~~LOC~~ IMPL
3.6000 mg | DRUG_IMPLANT | Freq: Once | SUBCUTANEOUS | Status: AC
  Administered 2014-01-08: 3.6 mg via SUBCUTANEOUS
  Filled 2014-01-08: qty 3.6

## 2014-01-10 ENCOUNTER — Ambulatory Visit (INDEPENDENT_AMBULATORY_CARE_PROVIDER_SITE_OTHER): Payer: No Typology Code available for payment source

## 2014-01-10 ENCOUNTER — Ambulatory Visit: Payer: No Typology Code available for payment source | Admitting: Physical Therapy

## 2014-01-10 DIAGNOSIS — Z4802 Encounter for removal of sutures: Secondary | ICD-10-CM

## 2014-01-10 NOTE — Progress Notes (Signed)
The pt came in for suture removal.  It was left in where the jp drain was.  Her mastectomy incision is looking good per the patient and her physical therapist.  It is healing and has some scabbing.  She has a bandage over that incision.  I removed the one stitch and covered it with a band aid.  I asked her to call with any further concerns.

## 2014-01-11 ENCOUNTER — Other Ambulatory Visit: Payer: Self-pay | Admitting: Emergency Medicine

## 2014-01-11 MED ORDER — ONDANSETRON HCL 8 MG PO TABS: 8.0000 mg | ORAL_TABLET | Freq: Three times a day (TID) | ORAL | Status: DC | PRN

## 2014-01-15 ENCOUNTER — Other Ambulatory Visit: Payer: Self-pay | Admitting: *Deleted

## 2014-01-15 ENCOUNTER — Ambulatory Visit: Payer: PRIVATE HEALTH INSURANCE | Attending: General Surgery | Admitting: Physical Therapy

## 2014-01-15 DIAGNOSIS — M25519 Pain in unspecified shoulder: Secondary | ICD-10-CM | POA: Insufficient documentation

## 2014-01-15 DIAGNOSIS — Z9010 Acquired absence of unspecified breast and nipple: Secondary | ICD-10-CM

## 2014-01-15 DIAGNOSIS — IMO0001 Reserved for inherently not codable concepts without codable children: Secondary | ICD-10-CM | POA: Insufficient documentation

## 2014-01-15 DIAGNOSIS — M24519 Contracture, unspecified shoulder: Secondary | ICD-10-CM | POA: Insufficient documentation

## 2014-01-15 DIAGNOSIS — C50219 Malignant neoplasm of upper-inner quadrant of unspecified female breast: Secondary | ICD-10-CM

## 2014-01-15 MED ORDER — FENTANYL 25 MCG/HR TD PT72: 25.0000 ug | MEDICATED_PATCH | TRANSDERMAL | Status: DC

## 2014-01-15 MED ORDER — OXYCODONE HCL 5 MG PO TABS: 5.0000 mg | ORAL_TABLET | ORAL | Status: DC | PRN

## 2014-01-15 NOTE — Progress Notes (Signed)
Radiation Oncology         [phone]) [phone] ________________________________  Name: Erika [name] MRN: [id number]  Date: 01/16/2014  DOB: 10/15/1971  Follow-Up Visit Note  Outpatient  CC: [name], MD  Deatra Robinson, MD  Diagnosis:   Clinical [phone],  ypT4b, ypN0 left breast  INVASIVE DUCTAL CARCINOMA, triple negative, grade III  Narrative:  The patient returns today for routine follow-up.  She saw Dr. Pablo Ledger in January following surgery but at that time still needed to heal further. Surgery was performed in December after a partial response to neoadjuvant chemotherapy.  Pathology revealed: 6.5 cm tumor, 0/12 nodes were positive, closest margin was 0.2cm from deep margin.  Her ROM in her shoulder is much better thanks to PT.  She denies lymphedema.  She is coping with peripheral neuropathy. She is in great spirits.                             ALLERGIES:  is allergic to doxycycline.  Meds: Current Outpatient Prescriptions  Medication Sig Dispense Refill  . cyclobenzaprine (FLEXERIL) 5 MG tablet TAKE 1 TABLET BY MOUTH THREE TIMES A DAY FOR MUSCLE SPASMS  30 tablet  1  . fentaNYL (DURAGESIC - DOSED MCG/HR) 25 MCG/HR patch Place 1 patch (25 mcg total) onto the skin every 3 (three) days. Use with 75 mcg patch to equal 100 mcg.  10 patch  0  . fentaNYL (DURAGESIC - DOSED MCG/HR) 75 MCG/HR Place 1 patch (75 mcg total) onto the skin every 3 (three) days.  10 patch  0  . ibuprofen (ADVIL,MOTRIN) 200 MG tablet Take 600-800 mg by mouth every 4 (four) hours as needed for mild pain or moderate pain.       Marland Kitchen lidocaine-prilocaine (EMLA) cream Apply 1 application topically every 14 (fourteen) days. Every other Friday prior to chemo      . LORazepam (ATIVAN) 0.5 MG tablet Take 0.5 mg by mouth every 6 (six) hours as needed for anxiety.      . ondansetron (ZOFRAN) 8 MG tablet Take 1 tablet (8 mg total) by mouth every 8 (eight) hours as needed for nausea or vomiting.  30 tablet  0  . oxyCODONE  (OXY IR/ROXICODONE) 5 MG immediate release tablet Take 1 tablet (5 mg total) by mouth every 4 (four) hours as needed for moderate pain or severe pain.  60 tablet  0  . PRESCRIPTION MEDICATION PT REPORTS RECEIVING A SHOT ONCE A MONTH AT [facility] TO STOP HER MENSTRUAL PERIODS - I SEE DOCUMENTATION IN OFFICE NOTES FROM CANCER CENTER THAT PT WAS GETTING ZOLADEX SQ EVERY 28 DAYS.      Marland Kitchen traMADol (ULTRAM) 50 MG tablet Take 1-2 tablets (50-100 mg total) by mouth every 6 (six) hours as needed.  30 tablet  1  . enoxaparin (LOVENOX) 150 MG/ML injection Inject 1 mL (150 mg total) into the skin daily.  30 Syringe  1  . zolpidem (AMBIEN) 5 MG tablet Take 5 mg by mouth at bedtime as needed for sleep.       No current facility-administered medications for this encounter.    Physical Findings: The patient is in no acute distress. Patient is alert and oriented.  height is 5' 5.5" (1.664 m) and weight is 226 lb 8 oz (102.74 kg). Her temperature is 98.3 F (36.8 C). Her blood pressure is 124/86 and her pulse is 86. Marland Kitchen   Left chest wall demonstrates some residual  scabs over her surgical scars but no active bleeding or oozing. No drains. Extremely faint lymphedema in left forearm.  Excellent range of motion in her shoulders.   Lab Findings: Lab Results  Component Value Date   WBC 5.5 12/28/2013   HGB 11.1* 12/28/2013   HCT 33.7* 12/28/2013   MCV 88.3 12/28/2013   PLT 335 12/28/2013    PATHOLOGY: 1. Breast, modified radical mastectomy , left - RESIDUAL INVASIVE DUCTAL CARCINOMA, NOTTINGHAM COMBINED HISTOLOGIC GRADE III, 6.5 CM, 0.2 CM FROM THE INKED DEEP MARGIN. - ANGIOLYMPHATIC INVASION PRESENT. - EXTENSIVE POST- TREATMENT EFFECT. - TWELVE LYMPH NODES, NEGATIVE FOR RESIDUAL CARCINOMA (0/12). 2. Soft tissue mass, simple excision, left axillary - MATURE ADIPOSE TISSUE, NO LYMPHOID TISSUE IDENTIFIED. - NO EVIDENCE OF MALIGNANCY.   Procedure: Left modified radical mastectomy Histologic type: Invasive  ductal carcinoma Grade: III Tubule formation: 3 Nuclear pleomorphism: 3 Mitotic: 3 Tumor size (gross measurement): residual invasive tumor,6.5 cm. Margins: Negative Invasive, distance to closest margin: Deep margin, 0.2 cm In-situ, distance to closest margin: N/A Lymphovascular invasion: Present Ductal carcinoma in situ: Not identified Lobular neoplasia: N/A Tumor focality: unifocal Treatment effect: Present If present, treatment effect in breast tissue, lymph nodes or both: In both lymph nodes and breast tissue, extensively Extent of tumor: Skin: Dermis involved with edematous changes grossly Nipple: Not involved Skeletal muscle: N/A Lymph nodes: Examined: 0 Sentinel 12 Non-sentinel 12 Total Lymph nodes with metastasis: 0 Isolated tumor cells (< 0.2 mm): 0 Micrometastasis: (> 0.2 mm and < 2.0 mm): 0 Macrometastasis: (> 2.0 mm): 0 Extracapsular extension: 0 Breast prognostic profile: Will be repeated and an addendum report will follow. Please correlate with previous case [phone]. Estrogen receptor: 14%, positive, moderate staining intensity Progesterone receptor: 0%, negative Her 2 neu: No amplification of Her 2 neu detected. Ratio of Her 2:CEP 17 signals is 0.90. Average Her 2 copy number per cell is 1.75 Ki-67: 100% Non-neoplastic breast: Extensive post-treatment effect. TNM: y pT4b, ypN0  Radiographic Findings: No results found.  Impression/Plan: Plan for simulation this Monday, February 9. We reviewed the risks benefits and side effects of radiotherapy to the left chest wall and regional lymph nodes. She understands the most common side effects would be skin irritation and fatigue. We also spoke about rare irritation to the internal organs such as the heart or lungs. We spoke about the increased risk of lymphedema. She is enthusiastic to proceed with treatment. Anticipate she'll receive 6-7 weeks of radiotherapy. I plan to treat the axilla supraclavicular fossa and  chest wall comprehensively.  I spent 15 minutes face to face with the patient and more than 50% of that time was spent in counseling and/or coordination of care. _____________________________________   [name], MD

## 2014-01-16 ENCOUNTER — Ambulatory Visit
Admission: RE | Admit: 2014-01-16 | Discharge: 2014-01-16 | Disposition: A | Discharge status: Home / Self Care | Payer: PRIVATE HEALTH INSURANCE | Source: Ambulatory Visit | Attending: Radiation Oncology | Admitting: Radiation Oncology

## 2014-01-16 ENCOUNTER — Telehealth: Payer: Self-pay | Admitting: *Deleted

## 2014-01-16 ENCOUNTER — Encounter: Payer: Self-pay | Admitting: Radiation Oncology

## 2014-01-16 ENCOUNTER — Ambulatory Visit
Admission: RE | Admit: 2014-01-16 | Discharge: 2014-01-16 | Disposition: A | Discharge status: Home / Self Care | Payer: No Typology Code available for payment source | Source: Ambulatory Visit | Attending: Radiation Oncology | Admitting: Radiation Oncology

## 2014-01-16 VITALS — BP 124/86 | HR 86 | Temp 98.3°F | Ht 65.5 in | Wt 226.5 lb

## 2014-01-16 DIAGNOSIS — I890 Lymphedema, not elsewhere classified: Secondary | ICD-10-CM | POA: Insufficient documentation

## 2014-01-16 DIAGNOSIS — C50219 Malignant neoplasm of upper-inner quadrant of unspecified female breast: Secondary | ICD-10-CM

## 2014-01-16 DIAGNOSIS — C50919 Malignant neoplasm of unspecified site of unspecified female breast: Secondary | ICD-10-CM | POA: Insufficient documentation

## 2014-01-16 DIAGNOSIS — Z79899 Other long term (current) drug therapy: Secondary | ICD-10-CM | POA: Insufficient documentation

## 2014-01-16 DIAGNOSIS — G609 Hereditary and idiopathic neuropathy, unspecified: Secondary | ICD-10-CM | POA: Insufficient documentation

## 2014-01-16 DIAGNOSIS — Z9010 Acquired absence of unspecified breast and nipple: Secondary | ICD-10-CM | POA: Insufficient documentation

## 2014-01-16 NOTE — Telephone Encounter (Signed)
This RN called patient and informed her that her two prescriptions are ready for pick up. (Oxycodone and Fentanyl 25 mcg patch). Patient verbalized understanding.

## 2014-01-16 NOTE — Progress Notes (Signed)
Location of Breast Cancer:Left Breast - Upper Inner Quadrant   Histology per Pathology Report: Invasive Ductal Carcinoma Biopsy was performed on 04/23/2013. Pathology revealed :  Breast, biopsy, left mass  - INVASIVE HIGH GRADE CARCINOMA, SEE COMMENT.  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 on the surface of her left breast about 6 months prior to diagnosis . This began to grow and she saw a Dr. in urgent care and he cut into the mass assuming it was an abscess. Patient was subsequently referred to Dr. Rosendo Gros   .  Past/Anticipated interventions by surgeon, if JSC:BIPJRP Left Breast   Past/Anticipated interventions by medical oncology, if any : Neoadjuvant chemotherapy. Adriamycin and Cytoxan began on 05/25/13. She completed 4 cycles with Neulasta support. Taxol carbo began on 8/8. 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   Radiation therapy with concurrent Xeloda    Lymphedema issues, if any:  None, but receiving physical therapy to increase her range of motion.  Tingling of fingers and toes.  When ambulating she states an increase in pain on the bottom of her feet and has fallen twice and cracked a tooth on the right upper jaw region.   Pain issues, if any: left chest wall/ left shoulder - throbbing and lower back pain. Grades as a level 5 on a scale of 0 -10  SAFETY ISSUES:  Prior radiation?No  Pacemaker/ICD? No  Possible current pregnancy?No  Is the patient on methotrexate? No   Current Complaints / other details: Right upper extremity DVT: Patient is on Lovenox since  July 2014

## 2014-01-17 ENCOUNTER — Ambulatory Visit: Payer: PRIVATE HEALTH INSURANCE | Admitting: Physical Therapy

## 2014-01-17 ENCOUNTER — Other Ambulatory Visit (INDEPENDENT_AMBULATORY_CARE_PROVIDER_SITE_OTHER): Payer: Self-pay | Admitting: *Deleted

## 2014-01-17 MED ORDER — UNABLE TO FIND: Status: DC

## 2014-01-18 MED ORDER — BIAFINE EX EMUL
Freq: Every day | CUTANEOUS | Status: DC
  Administered 2014-01-18: 10:00:00 via TOPICAL

## 2014-01-18 NOTE — Addendum Note (Signed)
Encounter addended by: Heywood Footman, RN on: 01/18/2014  9:32 AM<BR>     Documentation filed: Inpatient MAR, Orders

## 2014-01-21 ENCOUNTER — Other Ambulatory Visit (HOSPITAL_BASED_OUTPATIENT_CLINIC_OR_DEPARTMENT_OTHER): Payer: PRIVATE HEALTH INSURANCE

## 2014-01-21 ENCOUNTER — Encounter: Payer: Self-pay | Admitting: Oncology

## 2014-01-21 ENCOUNTER — Ambulatory Visit (HOSPITAL_BASED_OUTPATIENT_CLINIC_OR_DEPARTMENT_OTHER): Payer: PRIVATE HEALTH INSURANCE | Admitting: Oncology

## 2014-01-21 ENCOUNTER — Telehealth: Payer: Self-pay | Admitting: Oncology

## 2014-01-21 ENCOUNTER — Ambulatory Visit
Admission: RE | Admit: 2014-01-21 | Discharge: 2014-01-21 | Disposition: A | Discharge status: Home / Self Care | Payer: PRIVATE HEALTH INSURANCE | Source: Ambulatory Visit | Attending: Radiation Oncology | Admitting: Radiation Oncology

## 2014-01-21 VITALS — BP 115/79 | HR 72 | Temp 98.0°F | Resp 20 | Ht 65.5 in | Wt 220.8 lb

## 2014-01-21 DIAGNOSIS — I82409 Acute embolism and thrombosis of unspecified deep veins of unspecified lower extremity: Secondary | ICD-10-CM

## 2014-01-21 DIAGNOSIS — C773 Secondary and unspecified malignant neoplasm of axilla and upper limb lymph nodes: Secondary | ICD-10-CM

## 2014-01-21 DIAGNOSIS — C50219 Malignant neoplasm of upper-inner quadrant of unspecified female breast: Secondary | ICD-10-CM

## 2014-01-21 DIAGNOSIS — R5383 Other fatigue: Secondary | ICD-10-CM

## 2014-01-21 DIAGNOSIS — D649 Anemia, unspecified: Secondary | ICD-10-CM

## 2014-01-21 DIAGNOSIS — Z86718 Personal history of other venous thrombosis and embolism: Secondary | ICD-10-CM

## 2014-01-21 DIAGNOSIS — Z510 Encounter for antineoplastic radiation therapy: Secondary | ICD-10-CM | POA: Insufficient documentation

## 2014-01-21 DIAGNOSIS — D509 Iron deficiency anemia, unspecified: Secondary | ICD-10-CM

## 2014-01-21 DIAGNOSIS — Z171 Estrogen receptor negative status [ER-]: Secondary | ICD-10-CM

## 2014-01-21 DIAGNOSIS — G8929 Other chronic pain: Secondary | ICD-10-CM

## 2014-01-21 DIAGNOSIS — C50419 Malignant neoplasm of upper-outer quadrant of unspecified female breast: Secondary | ICD-10-CM | POA: Insufficient documentation

## 2014-01-21 DIAGNOSIS — R5381 Other malaise: Secondary | ICD-10-CM

## 2014-01-21 LAB — CBC WITH DIFFERENTIAL/PLATELET
BASO%: 0 % (ref 0.0–2.0)
BASOS ABS: 0 10*3/uL (ref 0.0–0.1)
EOS ABS: 0.2 10*3/uL (ref 0.0–0.5)
EOS%: 4.1 % (ref 0.0–7.0)
HCT: 34.9 % (ref 34.8–46.6)
HEMOGLOBIN: 11 g/dL — AB (ref 11.6–15.9)
LYMPH%: 35.2 % (ref 14.0–49.7)
MCH: 27.5 pg (ref 25.1–34.0)
MCHC: 31.5 g/dL (ref 31.5–36.0)
MCV: 87.3 fL (ref 79.5–101.0)
MONO#: 0.3 10*3/uL (ref 0.1–0.9)
MONO%: 4.9 % (ref 0.0–14.0)
NEUT%: 55.8 % (ref 38.4–76.8)
NEUTROS ABS: 2.9 10*3/uL (ref 1.5–6.5)
Platelets: 295 10*3/uL (ref 145–400)
RBC: 4 10*6/uL (ref 3.70–5.45)
RDW: 14.8 % — AB (ref 11.2–14.5)
WBC: 5.1 10*3/uL (ref 3.9–10.3)
lymph#: 1.8 10*3/uL (ref 0.9–3.3)

## 2014-01-21 LAB — FERRITIN CHCC: Ferritin: 59 ng/ml (ref 9–269)

## 2014-01-21 LAB — IRON AND TIBC CHCC
%SAT: 14 % — AB (ref 21–57)
Iron: 39 ug/dL — ABNORMAL LOW (ref 41–142)
TIBC: 280 ug/dL (ref 236–444)
UIBC: 241 ug/dL (ref 120–384)

## 2014-01-21 MED ORDER — CAPECITABINE 500 MG PO TABS: ORAL_TABLET | ORAL | Status: DC

## 2014-01-21 NOTE — Patient Instructions (Signed)
Capecitabine tablets What is this medicine? CAPECITABINE (ka pe SITE a been) is a chemotherapy drug. It slows the growth of cancer cells. This medicine is used to treat breast cancer, and also colon or rectal cancer. This medicine may be used for other purposes; ask your health care provider or pharmacist if you have questions. COMMON BRAND NAME(S): Xeloda What should I tell my health care provider before I take this medicine? They need to know if you have any of these conditions: -bleeding or blood disorders -dihydropyrimidine dehydrogenase (DPD) deficiency -heart disease -infection (especially a virus infection such as chickenpox, cold sores, or herpes) -kidney disease -liver disease -an unusual or allergic reaction to capecitabine, 5-fluorouracil, other medicines, foods, dyes, or preservatives -pregnant or trying to get pregnant -breast-feeding How should I use this medicine? Take this medicine by mouth with a glass of water, within 30 minutes of the end of a meal. Follow the directions on the prescription label. Take your medicine at regular intervals. Do not take it more often than directed. Do not stop taking except on your doctor's advice. Your doctor may want you to take a combination of 150 mg and 500 mg tablets for each dose. It is very important that you know how to correctly take your dose. Taking the wrong tablets could result in an overdose (too much medication) or underdose (too little medication). Talk to your pediatrician regarding the use of this medicine in children. Special care may be needed. Overdosage: If you think you have taken too much of this medicine contact a poison control center or emergency room at once. NOTE: This medicine is only for you. Do not share this medicine with others. What if I miss a dose? If you miss a dose, do not take the missed dose at all. Do not take double or extra doses. Instead, continue with your next scheduled dose and check with your  doctor. What may interact with this medicine? -antacids with aluminum and/or magnesium -folic acid -leucovorin -medicines to increase blood counts like filgrastim, pegfilgrastim, sargramostim -phenytoin -vaccines -warfarin Talk to your doctor or health care professional before taking any of these medicines: -acetaminophen -aspirin -ibuprofen -ketoprofen -naproxen This list may not describe all possible interactions. Give your health care provider a list of all the medicines, herbs, non-prescription drugs, or dietary supplements you use. Also tell them if you smoke, drink alcohol, or use illegal drugs. Some items may interact with your medicine. What should I watch for while using this medicine? Visit your doctor for checks on your progress. This drug may make you feel generally unwell. This is not uncommon, as chemotherapy can affect healthy cells as well as cancer cells. Report any side effects. Continue your course of treatment even though you feel ill unless your doctor tells you to stop. In some cases, you may be given additional medicines to help with side effects. Follow all directions for their use. Call your doctor or health care professional for advice if you get a fever, chills or sore throat, or other symptoms of a cold or flu. Do not treat yourself. This drug decreases your body's ability to fight infections. Try to avoid being around people who are sick. This medicine may increase your risk to bruise or bleed. Call your doctor or health care professional if you notice any unusual bleeding. Be careful brushing and flossing your teeth or using a toothpick because you may get an infection or bleed more easily. If you have any dental work done, tell your   dentist you are receiving this medicine. Avoid taking products that contain aspirin, acetaminophen, ibuprofen, naproxen, or ketoprofen unless instructed by your doctor. These medicines may hide a fever. Do not become pregnant while  taking this medicine. Women should inform their doctor if they wish to become pregnant or think they might be pregnant. There is a potential for serious side effects to an unborn child. Talk to your health care professional or pharmacist for more information. Do not breast-feed an infant while taking this medicine. Men are advised not to father a child while taking this medicine. What side effects may I notice from receiving this medicine? Side effects that you should report to your doctor or health care professional as soon as possible: -allergic reactions like skin rash, itching or hives, swelling of the face, lips, or tongue -low blood counts - this medicine may decrease the number of white blood cells, red blood cells and platelets. You may be at increased risk for infections and bleeding. -signs of infection - fever or chills, cough, sore throat, pain or difficulty passing urine -signs of decreased platelets or bleeding - bruising, pinpoint red spots on the skin, black, tarry stools, blood in the urine -signs of decreased red blood cells - unusually weak or tired, fainting spells, lightheadedness -breathing problems -changes in vision -chest pain -diarrhea of more than 4 bowel movements in one day or any diarrhea at night -mouth sores -nausea and vomiting -pain, swelling, redness at site where injected -pain, tingling, numbness in the hands or feet -redness, swelling, or sores on hands or feet -stomach pain -vomiting -yellow color of skin or eyes Side effects that usually do not require medical attention (report to your doctor or health care professional if they continue or are bothersome): -constipation -diarrhea -dry or itchy skin -hair loss -loss of appetite -nausea -weak or tired This list may not describe all possible side effects. Call your doctor for medical advice about side effects. You may report side effects to FDA at 1-800-FDA-1088. Where should I keep my  medicine? Keep out of the reach of children. Store at room temperature between 15 and 30 degrees C (59 and 86 degrees F). Keep container tightly closed. Throw away any unused medicine after the expiration date. NOTE: This sheet is a summary. It may not cover all possible information. If you have questions about this medicine, talk to your doctor, pharmacist, or health care provider.  2014, [employer] Standard. (2008-11-11 11:47:41)   

## 2014-01-21 NOTE — Telephone Encounter (Signed)
gv adn printed appt sched and avs forpt for Feb and March °

## 2014-01-21 NOTE — Progress Notes (Signed)
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    PRIOR THERAPY: 1.  left sided breast mass show up about 6 months prior to diagnosis. 2 months prior this began getting larger rapidly. She was seen in an urgent care where this mass was thought to be an abscess. Apparently there was an attempt to excise or drain this that was not successful and she was referred to Dr. [name] for evaluation. She underwent punch biopsy on 04/23/13 of her skin and the mass. She was then also sent for mm/us. The biopsy shows invasive high grade carcinoma that appears to be breast. It is ER 14%, PR negative, HER-2/neu negative with a Ki-67 that approaches 100%. She has also undergone mm with at least 10 cm upper inner left breast tumor with mild overyling skin thickening. There is also a 3x10 mm cluster of calcs near there also. There is indeterminate 7 mm level 1 left axillary node, she underwent a lymph node biopsy that was positive for disease. No mm evidence of right breast malignancy.  An MRI of the breasts was done on 05/22/13 and showed a 14 cm mass in the left breast protruding from the skin and also a 2.5 cm level II axillary node.   2.  S/P neoadjuvant chemotherapy.  Adriamycin and Cytoxan began on 05/25/13.  She completed 4 cycles with Neulasta support.   Taxol carbo began on 8/8. This was discontinued due to neuropathy, she started on Gemzar/Carbo on 8/22- 11/2013.  #3 staging PET/CT performed on 06/13/2013 revealed no evidence of distant disease.  #4 anemia of iron deficiency.  She has received parenteral iron in the past.  This was associated with menorrhagia, the patient has since been receiving Zoladex with great improvement of her hemoglobin and iron studies.   #5 Right upper extremity DVT: Patient is on Lovenox since July 2014 completed all  anti-coagulation in 12/14.  #6 status post left mastectomy with axillary lymph node dissection. Final pathology did reveal 6.5 cm residual disease 0 of 12 lymph nodes positive for metastatic disease. Tumor was estrogen receptor +14% progesterone receptor negative HER-2/neu negative  CURRENT THERAPY: begin concurrent radiation therapy and Xeloda  INTERVAL HISTORY: Erika [name] 43 y.o. female returns for followup visit today. Overall she is doing well. She does continue to complain of significant amount of pain in the left mastectomy site as well as all over. This has been going on for quite some time. We have been treating this with analgesics such as fentanyl patches and morphine derivatives. She states and tells me that she forgot to put her fentanyl patches on and therefore that was one of the reasons why she was in so much pain over the last 1 week. She otherwise has no complaints she denies any headaches double vision blurring of vision fevers chills or night sweats. She is just fatigue.. She has been seen by Dr. Pablo Ledger. She is planning on starting radiation soon.  I gave her a prescription for a low.to be taken concurrently with the radiation therapy. We discussed risks benefits and side effects including rashes diarrhea mucositis myelosuppression. She knows to report any of this to [place].remainder of the template review of systems is negative.  MEDICAL HISTORY: Past Medical History  Diagnosis Date  . Breast cancer     Invasive High Grade Carcinoma  . Headache(784.0)  Hx: of Migraines  . Status post chemotherapy     Adriamycin and Cytoxan  . Anxiety     ANXIOUS ABOUT HAVEING BIG SURGERY - MASTECTOMY  . 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 DVT (deep venous thrombosis)     RT ARM-PT ON LOVENOX  . Hx of iron deficiency anemia 11/26/13    ALLERGIES:  is allergic to doxycycline.  MEDICATIONS:  Current Outpatient Prescriptions  Medication Sig Dispense Refill  . cyclobenzaprine  (FLEXERIL) 5 MG tablet TAKE 1 TABLET BY MOUTH THREE TIMES A DAY FOR MUSCLE SPASMS  30 tablet  1  . fentaNYL (DURAGESIC - DOSED MCG/HR) 25 MCG/HR patch Place 1 patch (25 mcg total) onto the skin every 3 (three) days. Use with 75 mcg patch to equal 100 mcg.  10 patch  0  . fentaNYL (DURAGESIC - DOSED MCG/HR) 75 MCG/HR Place 1 patch (75 mcg total) onto the skin every 3 (three) days.  10 patch  0  . ibuprofen (ADVIL,MOTRIN) 200 MG tablet Take 600-800 mg by mouth every 4 (four) hours as needed for mild pain or moderate pain.       Marland Kitchen lidocaine-prilocaine (EMLA) cream Apply 1 application topically every 14 (fourteen) days. Every other Friday prior to chemo      . LORazepam (ATIVAN) 0.5 MG tablet Take 0.5 mg by mouth every 6 (six) hours as needed for anxiety.      . ondansetron (ZOFRAN) 8 MG tablet Take 1 tablet (8 mg total) by mouth every 8 (eight) hours as needed for nausea or vomiting.  30 tablet  0  . oxyCODONE (OXY IR/ROXICODONE) 5 MG immediate release tablet Take 1 tablet (5 mg total) by mouth every 4 (four) hours as needed for moderate pain or severe pain.  60 tablet  0  . PRESCRIPTION MEDICATION PT REPORTS RECEIVING A SHOT ONCE A MONTH AT CAN[facility]ER CENTER TO STOP HER MENSTRUAL PERIODS - I SEE DOCUMENTATION IN OFFICE NOTES FROM CANCER CENTER THAT PT WAS GETTING ZOLADEX SQ EVERY 28 DAYS.      Marland Kitchen traMADol (ULTRAM) 50 MG tablet Take 1-2 tablets (50-100 mg total) by mouth every 6 (six) hours as needed.  30 tablet  1  . UNABLE TO FIND Rx: L8000- Post Surgical Bra (Quantity: 6) D1761- Non Silicone Breast prosthesis (Quantity: 1) Dx: 174.9; Left mastectomy  1 each  0  . zolpidem (AMBIEN) 5 MG tablet Take 5 mg by mouth at bedtime as needed for sleep.       No current facility-administered medications for this visit.   Facility-Administered Medications Ordered in Other Visits  Medication Dose Route Frequency Provider Last Rate Last Dose  . topical emolient (BIAFINE) emulsion   Topical Daily [name], MD         SURGICAL HISTORY:  Past Surgical History  Procedure Laterality Date  . Tubal ligation    . Portacath placement Right 05/22/2013    Procedure: INSERTION PORT-A-CATH;  Surgeon: [name], MD;  Location: [facility];  Service: General;  Laterality: Right;  . Left breast needle core biopsy Left 05/21/13  . Left breast needle core  biopsy Left 06/22/13  . Mastectomy modified radical Left 11/15/2013    Procedure: LEFT MASTECTOMY MODIFIED RADICAL;  Surgeon: [name], MD;  Location: [facility];  Service: General;  Laterality: Left;    REVIEW OF SYSTEMS:  A 10 point review of systems was conducted and is otherwise negative except for what is noted above.    PHYSICAL  EXAMINATION: Blood pressure 115/79, pulse 72, temperature 98 F (36.7 C), temperature source Oral, resp. rate 20, height 5' 5.5" (1.664 m), weight 220 lb 12.8 oz (100.154 kg), last menstrual period 05/30/2013. Body mass index is 36.17 kg/(m^2). GENERAL: Patient is a well appearing female in no acute distress HEENT:  Sclerae anicteric.  Oropharynx clear and moist. No ulcerations or evidence of oropharyngeal candidiasis. Neck is supple.  NODES:  No cervical, supraclavicular, or axillary lymphadenopathy palpated.  BREAST EXAM: left chest wall incision almost completely healed, there is an approximate 4x2cm laterally that is open, no drainage or any sign of infection LUNGS:  Clear to auscultation bilaterally.  No wheezes or rhonchi. HEART:  Regular rate and rhythm. No murmur appreciated. ABDOMEN:  Soft, nontender.  Positive, normoactive bowel sounds. No organomegaly palpated. MSK:  No focal spinal tenderness to palpation. Full range of motion bilaterally in the upper extremities. EXTREMITIES:  No peripheral edema.   SKIN:  Clear with no obvious rashes or skin changes. No nail dyscrasia. NEURO:  Nonfocal. Well oriented.  Appropriate affect. ECOG PERFORMANCE STATUS: 1 - Symptomatic but completely ambulatory  LABORATORY DATA: Lab  Results  Component Value Date   WBC 5.1 01/21/2014   HGB 11.0* 01/21/2014   HCT 34.9 01/21/2014   MCV 87.3 01/21/2014   PLT 295 01/21/2014      Chemistry      Component Value Date/Time   NA 139 12/28/2013 1506   NA 138 11/16/2013 0448   K 3.3* 12/28/2013 1506   K 3.5 11/16/2013 0448   CL 103 11/16/2013 0448   CL 102 06/01/2013 1500   CO2 28 12/28/2013 1506   CO2 27 11/16/2013 0448   BUN 9.1 12/28/2013 1506   BUN 8 11/16/2013 0448   CREATININE 0.8 12/28/2013 1506   CREATININE 0.86 11/16/2013 0448      Component Value Date/Time   CALCIUM 10.0 12/28/2013 1506   CALCIUM 8.9 11/16/2013 0448   ALKPHOS 98 12/28/2013 1506   ALKPHOS 111 05/18/2013 1205   AST 20 12/28/2013 1506   AST 18 05/18/2013 1205   ALT 15 12/28/2013 1506   ALT 16 05/18/2013 1205   BILITOT 0.20 12/28/2013 1506   BILITOT 0.2* 05/18/2013 1205     ADDITIONAL INFORMATION: 1. CHROMOGENIC IN-SITU HYBRIDIZATION Results: HER-2/NEU BY CISH - NO AMPLIFICATION OF HER-2 DETECTED. RESULT RATIO OF HER2: CEP 17 SIGNALS 1.19 AVERAGE HER2 COPY NUMBER PER CELL 1.25 REFERENCE RANGE NEGATIVE HER2/Chr17 Ratio <2.0 and Average HER2 copy number <4.0 EQUIVOCAL HER2/Chr17 Ratio <2.0 and Average HER2 copy number 4.0 and <6.0 POSITIVE HER2/Chr17 Ratio >=2.0 and/or Average HER2 copy number >=6.0 Enid Cutter MD Pathologist, Electronic Signature ( Signed 11/23/2013) 1. PROGNOSTIC INDICATORS - ACIS Results: IMMUNOHISTOCHEMICAL AND MORPHOMETRIC ANALYSIS BY THE AUTOMATED CELLULAR IMAGING SYSTEM (ACIS) Estrogen Receptor: 0%, NEGATIVE Progesterone Receptor: 0%, NEGATIVE COMMENT: The negative hormone receptor study(ies) in this case have no internal positive control. REFERENCE RANGE ESTROGEN RECEPTOR NEGATIVE <1% POSITIVE =>1% PROGESTERONE RECEPTOR 1 of 4 FINAL for Kolton, Tayla ([id number]) ADDITIONAL INFORMATION:(continued) NEGATIVE <1% POSITIVE =>1% All controls stained appropriately Enid Cutter MD Pathologist, Electronic Signature ( Signed  11/21/2013) FINAL DIAGNOSIS Diagnosis 1. Breast, modified radical mastectomy , left - RESIDUAL INVASIVE DUCTAL CARCINOMA, NOTTINGHAM COMBINED HISTOLOGIC GRADE III, 6.5 CM, 0.2 CM FROM THE INKED DEEP MARGIN. - ANGIOLYMPHATIC INVASION PRESENT. - EXTENSIVE POST- TREATMENT EFFECT. - TWELVE LYMPH NODES, NEGATIVE FOR RESIDUAL CARCINOMA (0/12). 2. Soft tissue mass, simple excision, left axillary - MATURE ADIPOSE TISSUE, NO LYMPHOID TISSUE IDENTIFIED. - NO  EVIDENCE OF MALIGNANCY. Microscopic Comment 1. BREAST, INVASIVE TUMOR, WITH LYMPH NODE SAMPLING Specimen, including laterality and lymph node sampling (sentinel, non-sentinel): Left breast and axillary lymph nodes Procedure: Left modified radical mastectomy Histologic type: Invasive ductal carcinoma Grade: III Tubule formation: 3 Nuclear pleomorphism: 3 Mitotic: 3 Tumor size (gross measurement): residual invasive tumor,6.5 cm. Margins: Negative Invasive, distance to closest margin: Deep margin, 0.2 cm In-situ, distance to closest margin: N/A Lymphovascular invasion: Present Ductal carcinoma in situ: Not identified Lobular neoplasia: N/A Tumor focality: unifocal Treatment effect: Present If present, treatment effect in breast tissue, lymph nodes or both: In both lymph nodes and breast tissue, extensively Extent of tumor: Skin: Dermis involved with edematous changes grossly Nipple: Not involved Skeletal muscle: N/A Lymph nodes: Examined: 0 Sentinel 12 Non-sentinel 2 of 4 FINAL for Bastien, Donnelle ([id number]) Microscopic Comment(continued) 12 Total Lymph nodes with metastasis: 0 Isolated tumor cells (< 0.2 mm): 0 Micrometastasis: (> 0.2 mm and < 2.0 mm): 0 Macrometastasis: (> 2.0 mm): 0 Extracapsular extension: 0 Breast prognostic profile: Will be repeated and an addendum report will follow. Please correlate with previous case [phone]. Estrogen receptor: 14%, positive, moderate staining intensity Progesterone receptor:  0%, negative Her 2 neu: No amplification of Her 2 neu detected. Ratio of Her 2:CEP 17 signals is 0.90. Average Her 2 copy number per cell is 1.75 Ki-67: 100% Non-neoplastic breast: Extensive post-treatment effect. TNM: y pT4b, ypN0 (HCL:kh 11/19/13 RADIOGRAPHIC STUDIES:  Chest 2 View  05/18/2013   *RADIOLOGY REPORT*  Clinical Data: Breast carcinoma  CHEST - 2 VIEW  Comparison: None.  Findings: Probable left breast mass. Lungs clear.  Heart size and pulmonary vascularity normal.  No effusion.  Visualized bones unremarkable.  IMPRESSION: No acute disease   Original Report Authenticated By: D. Wallace Going, MD   US Breast Left  05/08/2013   *RADIOLOGY REPORT*  Clinical Data:  43 year old female with large left breast mass - recent punch biopsy demonstrating high-grade carcinoma.  DIGITAL DIAGNOSTIC BILATERAL MAMMOGRAM WITH CAD AND LEFT BREAST ULTRASOUND:  Comparison:  None.  Findings:  ACR Breast Density Category 2: There is a scattered fibroglandular pattern.  A large irregular mass within the upper inner posterior left breast is identified - measuring at least 10 cm. Mild overlying skin thickening is identified compatible with dermal invasion. A 3 x 10 mm cluster of slightly heterogeneous left breast calcifications approximate 4 cm anterior and lateral to this mass is identified.  There is no evidence of suspicious mass, distortion or worrisome calcifications within the right breast. Mammographic images were processed with CAD.  On physical exam, a very large protuberant mass within the upper inner left breast is identified.  Ultrasound is performed, showing a large heterogeneous mass within the upper inner left breast at the 10-11 o'clock position, measuring at least 6.5 cm, but evaluation is limited by transducer skin area and protuberant portion of the mass. A 7 mm level I left axillary lymph node is identified with slightly echogenic rim - suspicious.  IMPRESSION: Large biopsy-proven carcinoma within  the upper inner left breast, measuring at least 10 cm mammographically. Mild overlying skin thickening compatible with dermal invasion.  3 x 10 mm cluster of indeterminate left breast calcifications 4 cm anterior and lateral to the biopsy-proven neoplasm.  This may represent DCIS and consider tissue sampling.  Indeterminate 7 mm level I left axillary lymph node - metastatic disease not excluded.  No mammographic evidence of right breast malignancy.  BI-RADS CATEGORY 6:  Known biopsy-proven malignancy - appropriate  action should be taken.  RECOMMENDATION: Treatment plan.  Consider biopsies of the indeterminate left axillary lymph node and left breast calcifications as clinically indicated.  Consider bilateral breast MRI as clinically indicated.  I have discussed the findings and recommendations with the patient. Results were also provided in writing at the conclusion of the visit.   Original Report Authenticated By: Margarette Canada, M.D.   Mr Breast Bilateral W Wo Contrast  05/22/2013   *RADIOLOGY REPORT*  Clinical Data: large invasive carcinoma left upper inner quadrant with dermal involvement  BILATERAL BREAST MRI WITH AND WITHOUT CONTRAST  Technique: Multiplanar, multisequence MR images of both breasts were obtained prior to and following the intravenous administration of 50m of Multihance.  Three dimensional images were evaluated at the independent DynaCad workstation.  Comparison:  05/08/13  Findings: There is moderate background parenchymal enhancement. The right breast and axilla are negative.  On the left, there is a large irregular mass with irregular borders in the upper inner quadrant, demonstrating heterogenous internal enhancement.  The mass measures 14 (AP x 7.5 (lateral) x 8 (craniocaudal)cm.  It causes protrusion of the skin of the breast in the 10 - 11 o'clock position, and it is associated with edematous change in the skin of the left breast, primarily in the upper inner quadrant but also in the  periareolar in more infra- areolar left breast.  The mass extends through pectoralis major musculature posteriorly, with enhancing mass extending all the way to the posterior edge of the muscle.  In the 2 o'clock position posteriorly there is a 953moval enhancing mass with a mixture of plateau and washout kinetics; it is an indeterminate finding but shows the suggestion of a fatty hilum, and it may be an intramammary lymph node.  In the left axilla, there is an enlarged level II lymph node demonstrating hypervascularity and cortical thickening.  It measures 2.5cm in greatest dimension, and the appearance is concerning for metastasis.  There are no other abnormalities on the left.  IMPRESSION: Large left breast malignancy associated with involvement of the dermis and the chest wall, as well as with probable axillary lymph node metastasis.  There is an indeterminate 78m17mass in the 2 o'clock position, that may be an intramammary lymph node.  BI-RADS CATEGORY 6:  Known biopsy-proven malignancy - appropriate action should be taken.  THREE-DIMENSIONAL MR IMAGE RENDERING ON INDEPENDENT WORKSTATION:  Three-dimensional MR images were rendered by post-processing of the original MR data on an independent workstation.  The three- dimensional MR images were interpreted, and findings were reported in the accompanying complete MRI report for this study.   Original Report Authenticated By: RaySkipper Cliche.D.   Dg Chest Port 1 View  05/22/2013   *RADIOLOGY REPORT*  Clinical Data: Port-A-Cath placement.  PORTABLE CHEST - 1 VIEW  Comparison: 05/18/2013  Findings: Right-sided power injectable Port-A-Cath noted with distal tip projecting over the SVC.  Low lung volumes are present, causing crowding of the pulmonary vasculature.  No pneumothorax noted.  Bilateral midlung subsegmental atelectasis noted.  Borderline cardiomegaly.  IMPRESSION:  1.  Power injectable catheter tip:  SVC.  No pneumothorax.   Original Report Authenticated  By: WalVan Clines.D.   Mm Digital Diagnostic Bilat  05/08/2013   *RADIOLOGY REPORT*  Clinical Data:  [age]ar old female with large left breast mass - recent punch biopsy demonstrating high-grade carcinoma.  DIGITAL DIAGNOSTIC BILATERAL MAMMOGRAM WITH CAD AND LEFT BREAST ULTRASOUND:  Comparison:  None.  Findings:  ACR Breast Density Category 2: There is  a scattered fibroglandular pattern.  A large irregular mass within the upper inner posterior left breast is identified - measuring at least 10 cm. Mild overlying skin thickening is identified compatible with dermal invasion. A 3 x 10 mm cluster of slightly heterogeneous left breast calcifications approximate 4 cm anterior and lateral to this mass is identified.  There is no evidence of suspicious mass, distortion or worrisome calcifications within the right breast. Mammographic images were processed with CAD.  On physical exam, a very large protuberant mass within the upper inner left breast is identified.  Ultrasound is performed, showing a large heterogeneous mass within the upper inner left breast at the 10-11 o'clock position, measuring at least 6.5 cm, but evaluation is limited by transducer skin area and protuberant portion of the mass. A 7 mm level I left axillary lymph node is identified with slightly echogenic rim - suspicious.  IMPRESSION: Large biopsy-proven carcinoma within the upper inner left breast, measuring at least 10 cm mammographically. Mild overlying skin thickening compatible with dermal invasion.  3 x 10 mm cluster of indeterminate left breast calcifications 4 cm anterior and lateral to the biopsy-proven neoplasm.  This may represent DCIS and consider tissue sampling.  Indeterminate 7 mm level I left axillary lymph node - metastatic disease not excluded.  No mammographic evidence of right breast malignancy.  BI-RADS CATEGORY 6:  Known biopsy-proven malignancy - appropriate action should be taken.  RECOMMENDATION: Treatment plan.   Consider biopsies of the indeterminate left axillary lymph node and left breast calcifications as clinically indicated.  Consider bilateral breast MRI as clinically indicated.  I have discussed the findings and recommendations with the patient. Results were also provided in writing at the conclusion of the visit.   Original Report Authenticated By: Margarette Canada, M.D.   Dg Fluoro Guide Cv Line-no Report  05/22/2013   CLINICAL DATA: surgery   FLOURO GUIDE CV LINE  Fluoroscopy was utilized by the requesting physician.  No radiographic  interpretation.    [place] Lt Breast Bx W Loc Dev 1st Lesion Img Bx Spec US Guide  05/22/2013   **ADDENDUM** CREATED: 05/22/2013 11:12:58  The patient was called by myself, Dr. Derrel Nip today.  The patient states she is doing well since her left axillary node biopsy without problems at the biopsy site.  The patient was given the results of her biopsy which was compatible with metastatic invasive mammary carcinoma.  This is concordant with imaging findings and patient's known history of recent positive biopsy of a large inner upper left breast mass demonstrating invasive mammary carcinoma. The patient had her breast MRI this morning and will receive Port-A- Cath placement by Dr. [name] this afternoon.  Recommend follow- up pending MRI results and per treatment plan.  **END ADDENDUM** SIGNED BY: Melene Plan. Derrel Nip, M.D.  05/21/2013   *RADIOLOGY REPORT*  Clinical Data:  New diagnosis of invasive mammary carcinoma in the left upper inner quadrant.  Abnormal lymph node noted on ultrasound dated 05/08/2013.  ULTRASOUND GUIDED CORE BIOPSY OF THE LEFT AXILLA  The patient and I discussed the procedure of ultrasound-guided biopsy, including benefits and alternatives.  We discussed the high likelihood of a successful procedure. We discussed the risks of the procedure, including infection, bleeding, tissue injury, clip migration, and inadequate sampling.  Written informed consent was given. Appropriate  time-out was performed.  Using sterile technique, 2% lidocaine, ultrasound guidance, and a 14 gauge automated biopsy device, biopsy was performed of the abnormal left axillary lymph node using a lateromedial approach.  At the conclusion of the procedure, a dumbbell shaped tissue marker clip was deployed into the biopsy cavity. Post biopsy mammogram was not performed.  IMPRESSION: Ultrasound guided biopsy of an abnormal left axillary lymph node. No apparent complications.  Original Report Authenticated By: Ulyess Blossom, M.D.    ASSESSMENT/PLAN:   1.Stage IIIB (T4N1) high grade invasive breast carcinoma ER 14%, PR negative, HER-2/neu negative. Status post neoadjuvant chemotherapy consisting of Adriamycin and Cytoxan given dose dense x4 cycles from 05/25/2013 through 07/13/2013. She then started paclitaxel and carboplatinum but received only one cycle from 07/20/2013 and discontinued on 07/27/2013. She could not tolerate this. She was switched to gemcitabine and carboplatinum given dose dense for total of 6 cycles from 08/03/2013 through November 2014. She tolerated this well. She has gone on to have a mastectomy by Dr. [name] performed on 11/15/2013. The final pathology revealed 6.5 cm residual disease with 0 12 lymph nodes positive for metastatic disease.  #2 patient has been seen by Dr. Thea Silversmith she will get post mastectomy radiation therapy. Patient and I discussed radiosensitizing chemotherapy consisting of Xeloda 1000 mg twice a day. we discussed rationale for this.the prescription has been sent to her pharmacy.  #3 DVT: Patient has completed all of her anticoagulation she is now off of Lovenox.  #4 anemia: stable  #5 chronic pain: Patient is having a lot of pain postmastectomy. We discussed ways to manage it. She is on a fentanyl patch we will continue this. She also has breakthrough medications. I have emphasized to her the need to continue to have good control on her pain  without overt medicating herself.  #6 we will plan on seeing the patient back in about 2-3 weeks' time for followup   All questions were answered. The patient knows to call the clinic with any problems, questions or concerns. We can certainly see the patient much sooner if necessary.  I spent 25 minutes counseling the patient face to face. The total time spent in the appointment was 30 minutes.  Marcy Panning, MD Medical/Oncology [facility] [phone] (beeper) [phone] (Office)

## 2014-01-21 NOTE — Progress Notes (Addendum)
  Radiation Oncology         [phone] ________________________________  Name: Erika [name] MRN: [id number]  Date: 01/21/2014  DOB: 1971-12-12  SIMULATION AND TREATMENT PLANNING NOTE  Outpatient  DIAGNOSIS:  Left Breast Cancer, locally advanced  NARRATIVE:  The patient was brought to the [facility].  Identity was confirmed.  All relevant records and images related to the planned course of therapy were reviewed.  The patient freely provided informed written consent to proceed with treatment after reviewing the details related to the planned course of therapy. The consent form was witnessed and verified by the simulation staff.    Then, the patient was set-up in a stable reproducible  supine position for radiation therapy in vaclock.  CT images were obtained.  Surface markings were placed.  The CT images were loaded into the planning software.    4DCT technology with breathhold technique was used, but this appeared not to draw the heart out of the fields in a significant way.  She also had difficulty holding her breath, so this technique will not be used.  Optical Surface Tracking Plan:  Since intensity modulated radiotherapy (IMRT) and 3D conformal radiation treatment methods are predicated on accurate and precise positioning for treatment, intrafraction motion monitoring is medically necessary to ensure accurate and safe treatment delivery. The ability to quantify intrafraction motion without excessive ionizing radiation dose can only be performed with optical surface tracking. Accordingly, surface imaging offers the opportunity to obtain 3D measurements of patient position throughout IMRT and 3D treatments without excessive radiation exposure. I am ordering optical surface tracking for this patient's upcoming course of radiotherapy.  ________________________________   Reference:  Ursula Alert, J, et al. Surface imaging-based analysis of intrafraction  motion for breast radiotherapy patients.Journal of [place], n. 6, nov. 2014. ISSN [id number].  Available at: <http://www.jacmp.org/index.php/jacmp/article/view/4957>.    TREATMENT PLANNING NOTE: Treatment planning then occurred.  The radiation prescription was entered and confirmed.    A total of 7 medically necessary complex treatment devices were fabricated and supervised by me - 4 parent fields with MLCs to block lungs and heart and 1 vaclock for her arms. Also, 2 field in fields for dose homogeneity of the chest wall.  I have requested : 3D Simulation  I have requested a DVH of the following structures: lung, heart, esophagus.  The patient will receive 50 Gy in 25 fractions to her chest wall, 46 Gy in 23 fractions to her axillary and supraclavicular nodes, and an electron scar boost of 10Gy/5 fractions.   Special Treatment Procedure Note: The patient will be receiving chemotherapy - Xeloda- concurrently. Chemotherapy heightens the risk of side effects. I have considered this during the patient's treatment planning process and will monitor the patient accordingly for side effects on a weekly basis. Concurrent chemotherapy increases the complexity of this patient's treatment and therefore this constitutes a special treatment procedure. -----------------------------------  [name], MD

## 2014-01-21 NOTE — Progress Notes (Signed)
Faxed xeloda prescription to [facility] °

## 2014-01-22 ENCOUNTER — Ambulatory Visit: Payer: PRIVATE HEALTH INSURANCE | Admitting: Physical Therapy

## 2014-01-22 ENCOUNTER — Encounter: Payer: Self-pay | Admitting: *Deleted

## 2014-01-22 NOTE — Progress Notes (Signed)
RECEIVED A FAX FROM [facility] CONCERNING A CONFIRMATION OF FACSIMILE RECEIPT FOR PT. REFERRAL. 

## 2014-01-23 ENCOUNTER — Encounter: Payer: Self-pay | Admitting: Oncology

## 2014-01-23 NOTE — Progress Notes (Signed)
Patient has to use [employer], faxed info to them @ [phone] phone # [phone]

## 2014-01-24 ENCOUNTER — Ambulatory Visit: Payer: PRIVATE HEALTH INSURANCE

## 2014-01-24 NOTE — Progress Notes (Signed)
RECEIVED A FAX FROM [facility] CONCERNING A NOTICE OF RX TRANSFER FOR CAPECITABINE TO [facility].

## 2014-01-28 ENCOUNTER — Ambulatory Visit: Payer: PRIVATE HEALTH INSURANCE | Admitting: Radiation Oncology

## 2014-01-29 ENCOUNTER — Ambulatory Visit: Payer: PRIVATE HEALTH INSURANCE

## 2014-01-29 ENCOUNTER — Ambulatory Visit
Admission: RE | Admit: 2014-01-29 | Payer: PRIVATE HEALTH INSURANCE | Source: Ambulatory Visit | Admitting: Radiation Oncology

## 2014-01-29 ENCOUNTER — Encounter: Payer: No Typology Code available for payment source | Admitting: Physical Therapy

## 2014-01-29 NOTE — Addendum Note (Signed)
Encounter addended by: Deirdre Evener, RN on: 01/29/2014 11:55 AM<BR>     Documentation filed: Charges VN

## 2014-01-30 ENCOUNTER — Ambulatory Visit: Payer: PRIVATE HEALTH INSURANCE

## 2014-01-30 ENCOUNTER — Ambulatory Visit
Admission: RE | Admit: 2014-01-30 | Discharge: 2014-01-30 | Disposition: A | Discharge status: Home / Self Care | Payer: PRIVATE HEALTH INSURANCE | Source: Ambulatory Visit | Attending: Radiation Oncology | Admitting: Radiation Oncology

## 2014-01-30 ENCOUNTER — Encounter: Payer: Self-pay | Admitting: Radiation Oncology

## 2014-01-30 NOTE — Progress Notes (Signed)
  Radiation Oncology         [phone] ________________________________  Name: Erika [name] MRN: [id number]  Date: 01/30/2014  DOB: 1971/04/06  Simulation Verification Note  Status: outpatient  NARRATIVE: The patient was brought to the treatment unit and placed in the planned treatment position. The clinical setup was verified. Then port films were obtained and uploaded to the radiation oncology medical record software.  The treatment beams were carefully compared against the planned radiation fields. The position location and shape of the radiation fields was reviewed. The targeted volume of tissue appears appropriately covered by the radiation beams. Organs at risk appear to be excluded as planned.  Based on my personal review, I approved the simulation verification. The patient's treatment will proceed as planned.  ------------------------------------------------  Thea Silversmith, MD

## 2014-01-31 ENCOUNTER — Ambulatory Visit: Payer: PRIVATE HEALTH INSURANCE | Admitting: Physical Therapy

## 2014-01-31 ENCOUNTER — Ambulatory Visit: Payer: PRIVATE HEALTH INSURANCE

## 2014-02-01 ENCOUNTER — Ambulatory Visit: Payer: PRIVATE HEALTH INSURANCE

## 2014-02-04 ENCOUNTER — Ambulatory Visit: Payer: PRIVATE HEALTH INSURANCE

## 2014-02-04 ENCOUNTER — Ambulatory Visit: Payer: PRIVATE HEALTH INSURANCE | Admitting: Physical Therapy

## 2014-02-04 ENCOUNTER — Ambulatory Visit
Admission: RE | Admit: 2014-02-04 | Discharge: 2014-02-04 | Disposition: A | Discharge status: Home / Self Care | Payer: PRIVATE HEALTH INSURANCE | Source: Ambulatory Visit | Attending: Radiation Oncology | Admitting: Radiation Oncology

## 2014-02-05 ENCOUNTER — Encounter: Payer: Self-pay | Admitting: Oncology

## 2014-02-05 ENCOUNTER — Ambulatory Visit: Payer: PRIVATE HEALTH INSURANCE

## 2014-02-05 ENCOUNTER — Ambulatory Visit
Admission: RE | Admit: 2014-02-05 | Discharge: 2014-02-05 | Disposition: A | Discharge status: Home / Self Care | Payer: PRIVATE HEALTH INSURANCE | Source: Ambulatory Visit | Attending: Radiation Oncology | Admitting: Radiation Oncology

## 2014-02-05 ENCOUNTER — Telehealth: Payer: Self-pay | Admitting: *Deleted

## 2014-02-05 ENCOUNTER — Ambulatory Visit: Payer: No Typology Code available for payment source | Admitting: Radiation Oncology

## 2014-02-05 DIAGNOSIS — C50219 Malignant neoplasm of upper-inner quadrant of unspecified female breast: Secondary | ICD-10-CM

## 2014-02-05 MED ORDER — ALRA NON-METALLIC DEODORANT (RAD-ONC)
1.0000 "application " | Freq: Once | TOPICAL | Status: AC
  Administered 2014-02-05: 1 via TOPICAL

## 2014-02-05 MED ORDER — RADIAPLEXRX EX GEL
Freq: Once | CUTANEOUS | Status: AC
  Administered 2014-02-05: 14:00:00 via TOPICAL

## 2014-02-05 NOTE — Telephone Encounter (Signed)
Called [phone] with Dr.khan's nurse, Meredith,RN, informe jher patient stated "she hasn't received her Xeloda in the mail as yet", this is her 2nd rad tx Left chest wall, pteducation done, Ailene Ravel stated she would check on it,thanked her 2:52 PM

## 2014-02-05 NOTE — Progress Notes (Signed)
Weekly rad txs left breast, patient education done, rad book, alra, and radiaplex gel given to patient, discusssed skin iritation, pain, fatigue, apply radiaplex gel to breast area after rad tx And bedtime, sees MD weekly/prn , patient has open area drainage on chest wall size of a nickel, places telfa pad over it till MD sees, gave extra supplies and mesh tube top, , patient hasn't started Xeloda as yet,hasn't come in the mail , will call DrKhan's nurse and inform of status 2:48 PM

## 2014-02-05 NOTE — Progress Notes (Signed)
Weekly Management Note:  outpatient Current Dose:  4 Gy  Projected Dose: 60 Gy   Narrative:  The patient presents for routine under treatment assessment.  CBCT/MVCT images/Port film x-rays were reviewed.  The chart was checked.  I saw Erika [name] after her second RT treatment to L Chest wall and nodes - she has a small open area along the chest wall scar that definitely was not open at the time of treatment planning two weeks ago.  She cannot recall any trauma to that area other than rubbing from her prosthetic bra. She is not smoking. Eating well.  Physical Findings:  vitals were not taken for this visit.  1 x 1.5cm area of dehiscence along chest wall scar. No sign of infection or obvious tumor  CBC    Component Value Date/Time   WBC 5.1 01/21/2014 1510   WBC 7.6 11/17/2013 0540   RBC 4.00 01/21/2014 1510   RBC 2.83* 11/17/2013 0540   HGB 11.0* 01/21/2014 1510   HGB 8.9* 11/17/2013 0540   HCT 34.9 01/21/2014 1510   HCT 26.8* 11/17/2013 0540   PLT 295 01/21/2014 1510   PLT 232 11/17/2013 0540   MCV 87.3 01/21/2014 1510   MCV 94.7 11/17/2013 0540   MCH 27.5 01/21/2014 1510   MCH 31.4 11/17/2013 0540   MCHC 31.5 01/21/2014 1510   MCHC 33.2 11/17/2013 0540   RDW 14.8* 01/21/2014 1510   RDW 15.9* 11/17/2013 0540   LYMPHSABS 1.8 01/21/2014 1510   LYMPHSABS 2.8 11/06/2013 1030   MONOABS 0.3 01/21/2014 1510   MONOABS 0.5 11/06/2013 1030   EOSABS 0.2 01/21/2014 1510   EOSABS 0.2 11/06/2013 1030   BASOSABS 0.0 01/21/2014 1510   BASOSABS 0.0 11/06/2013 1030     CMP     Component Value Date/Time   NA 139 12/28/2013 1506   NA 138 11/16/2013 0448   K 3.3* 12/28/2013 1506   K 3.5 11/16/2013 0448   CL 103 11/16/2013 0448   CL 102 06/01/2013 1500   CO2 28 12/28/2013 1506   CO2 27 11/16/2013 0448   GLUCOSE 113 12/28/2013 1506   GLUCOSE 116* 11/16/2013 0448   GLUCOSE 95 06/01/2013 1500   BUN 9.1 12/28/2013 1506   BUN 8 11/16/2013 0448   CREATININE 0.8 12/28/2013 1506   CREATININE 0.86 11/16/2013 0448   CALCIUM 10.0  12/28/2013 1506   CALCIUM 8.9 11/16/2013 0448   PROT 8.2 12/28/2013 1506   PROT 8.2 05/18/2013 1205   ALBUMIN 3.3* 12/28/2013 1506   ALBUMIN 2.8* 05/18/2013 1205   AST 20 12/28/2013 1506   AST 18 05/18/2013 1205   ALT 15 12/28/2013 1506   ALT 16 05/18/2013 1205   ALKPHOS 98 12/28/2013 1506   ALKPHOS 111 05/18/2013 1205   BILITOT 0.20 12/28/2013 1506   BILITOT 0.2* 05/18/2013 1205   GFRNONAA 82* 11/16/2013 0448   GFRAA >90 11/16/2013 0448     Impression:  The patient is tolerating radiotherapy.  Open area along scar.  Plan:  . I told her to 1) Hold Xeloda until Dr. Humphrey Rolls tells her to start it (it still hasn't arrived in the mail).   2) Apply Neosporin and tefla pads 3) Wear a bra or camisole that doesn't rub or chafe 4) That I would email a wound photo to Dr Humphrey Rolls and Dr [name] and that Dr. [name] may want to see her sooner for followup.  I am going to continue RT as her disease is very high risk and it has  already been delayed quite a while. I have informed the team of this development and plan. -----------------------------------  [name], MD

## 2014-02-05 NOTE — Progress Notes (Signed)
[facility], [phone], per the rep they did receive the prescription from [facility] 01/23/14 but they have not finished processing her claim.  I expressed this is important and she should have had it 2 weeks ago, they should get this to her within 72 hours.  I called the patient and explained what is happening and gave her the number to call to check on her shipment.

## 2014-02-05 NOTE — Progress Notes (Signed)
Weekly rad txs left breast, patient education done, rad book, alra, and radiaplex gel given to patient, discusssed skin iritation, pain, fatigue, apply radiaplex gel to breast area after rad tx  And bedtime, sees MD weekly/prn

## 2014-02-06 ENCOUNTER — Telehealth: Payer: Self-pay | Admitting: Oncology

## 2014-02-06 ENCOUNTER — Ambulatory Visit: Payer: PRIVATE HEALTH INSURANCE

## 2014-02-06 ENCOUNTER — Encounter: Payer: Self-pay | Admitting: *Deleted

## 2014-02-06 ENCOUNTER — Ambulatory Visit
Admission: RE | Admit: 2014-02-06 | Discharge: 2014-02-06 | Disposition: A | Discharge status: Home / Self Care | Payer: PRIVATE HEALTH INSURANCE | Source: Ambulatory Visit | Attending: Radiation Oncology | Admitting: Radiation Oncology

## 2014-02-06 NOTE — Telephone Encounter (Signed)
, °

## 2014-02-06 NOTE — Progress Notes (Signed)
Received call from patient if she could get a refill on her OxyIR 5 mg #60. Per Dr. Humphrey Rolls, the prescription can be refilled on 02/12/14. The new directions will be take one tablet by mouth every 12 hours as needed for pain. I will call this prescription in on 02/12/14.

## 2014-02-07 ENCOUNTER — Ambulatory Visit: Payer: PRIVATE HEALTH INSURANCE

## 2014-02-07 ENCOUNTER — Other Ambulatory Visit: Payer: Self-pay | Admitting: Adult Health

## 2014-02-08 ENCOUNTER — Other Ambulatory Visit: Payer: Self-pay | Admitting: *Deleted

## 2014-02-08 ENCOUNTER — Ambulatory Visit (HOSPITAL_BASED_OUTPATIENT_CLINIC_OR_DEPARTMENT_OTHER): Payer: PRIVATE HEALTH INSURANCE

## 2014-02-08 ENCOUNTER — Ambulatory Visit
Admission: RE | Admit: 2014-02-08 | Discharge: 2014-02-08 | Disposition: A | Discharge status: Home / Self Care | Payer: PRIVATE HEALTH INSURANCE | Source: Ambulatory Visit | Attending: Radiation Oncology | Admitting: Radiation Oncology

## 2014-02-08 ENCOUNTER — Ambulatory Visit: Payer: PRIVATE HEALTH INSURANCE

## 2014-02-08 ENCOUNTER — Telehealth: Payer: Self-pay | Admitting: Oncology

## 2014-02-08 VITALS — BP 106/76 | HR 92 | Temp 98.2°F

## 2014-02-08 DIAGNOSIS — C50219 Malignant neoplasm of upper-inner quadrant of unspecified female breast: Secondary | ICD-10-CM

## 2014-02-08 DIAGNOSIS — Z5111 Encounter for antineoplastic chemotherapy: Secondary | ICD-10-CM

## 2014-02-08 MED ORDER — OXYCODONE HCL 5 MG PO TABS: 5.0000 mg | ORAL_TABLET | Freq: Two times a day (BID) | ORAL | Status: DC | PRN

## 2014-02-08 MED ORDER — GOSERELIN ACETATE 3.6 MG ~~LOC~~ IMPL
3.6000 mg | DRUG_IMPLANT | Freq: Once | SUBCUTANEOUS | Status: AC
  Administered 2014-02-08: 3.6 mg via SUBCUTANEOUS
  Filled 2014-02-08: qty 3.6

## 2014-02-08 NOTE — Telephone Encounter (Signed)
, °

## 2014-02-11 ENCOUNTER — Encounter: Payer: Self-pay | Admitting: Radiation Oncology

## 2014-02-11 ENCOUNTER — Ambulatory Visit: Payer: PRIVATE HEALTH INSURANCE

## 2014-02-11 ENCOUNTER — Ambulatory Visit
Admission: RE | Admit: 2014-02-11 | Discharge: 2014-02-11 | Disposition: A | Discharge status: Home / Self Care | Payer: PRIVATE HEALTH INSURANCE | Source: Ambulatory Visit | Attending: Radiation Oncology | Admitting: Radiation Oncology

## 2014-02-11 VITALS — BP 109/90 | HR 65 | Temp 98.2°F | Resp 20 | Wt 220.4 lb

## 2014-02-11 DIAGNOSIS — C50219 Malignant neoplasm of upper-inner quadrant of unspecified female breast: Secondary | ICD-10-CM

## 2014-02-11 NOTE — Progress Notes (Signed)
   Weekly Management Note:  outpatient Current Dose:  10 Gy  Projected Dose: 60 Gy   Narrative:  The patient presents for routine under treatment assessment.  CBCT/MVCT images/Port film x-rays were reviewed.  The chart was checked. She is applying neosporin to her scar where it is open over the L chest wall. Has some nausea after treatments, taking Zofran. Receives Xeloda in mail tomorrow and sees Dr Humphrey Rolls tomorrow.  Physical Findings:  weight is 220 lb 6.4 oz (99.973 kg). Her oral temperature is 98.2 F (36.8 C). Her blood pressure is 109/90 and her pulse is 65. Her respiration is 20.  NAD, left chest wall scar wound is drying up and looks slightly better.  CBC    Component Value Date/Time   WBC 5.1 01/21/2014 1510   WBC 7.6 11/17/2013 0540   RBC 4.00 01/21/2014 1510   RBC 2.83* 11/17/2013 0540   HGB 11.0* 01/21/2014 1510   HGB 8.9* 11/17/2013 0540   HCT 34.9 01/21/2014 1510   HCT 26.8* 11/17/2013 0540   PLT 295 01/21/2014 1510   PLT 232 11/17/2013 0540   MCV 87.3 01/21/2014 1510   MCV 94.7 11/17/2013 0540   MCH 27.5 01/21/2014 1510   MCH 31.4 11/17/2013 0540   MCHC 31.5 01/21/2014 1510   MCHC 33.2 11/17/2013 0540   RDW 14.8* 01/21/2014 1510   RDW 15.9* 11/17/2013 0540   LYMPHSABS 1.8 01/21/2014 1510   LYMPHSABS 2.8 11/06/2013 1030   MONOABS 0.3 01/21/2014 1510   MONOABS 0.5 11/06/2013 1030   EOSABS 0.2 01/21/2014 1510   EOSABS 0.2 11/06/2013 1030   BASOSABS 0.0 01/21/2014 1510   BASOSABS 0.0 11/06/2013 1030     CMP     Component Value Date/Time   NA 139 12/28/2013 1506   NA 138 11/16/2013 0448   K 3.3* 12/28/2013 1506   K 3.5 11/16/2013 0448   CL 103 11/16/2013 0448   CL 102 06/01/2013 1500   CO2 28 12/28/2013 1506   CO2 27 11/16/2013 0448   GLUCOSE 113 12/28/2013 1506   GLUCOSE 116* 11/16/2013 0448   GLUCOSE 95 06/01/2013 1500   BUN 9.1 12/28/2013 1506   BUN 8 11/16/2013 0448   CREATININE 0.8 12/28/2013 1506   CREATININE 0.86 11/16/2013 0448   CALCIUM 10.0 12/28/2013 1506   CALCIUM 8.9 11/16/2013 0448   PROT 8.2 12/28/2013 1506   PROT 8.2 05/18/2013 1205   ALBUMIN 3.3* 12/28/2013 1506   ALBUMIN 2.8* 05/18/2013 1205   AST 20 12/28/2013 1506   AST 18 05/18/2013 1205   ALT 15 12/28/2013 1506   ALT 16 05/18/2013 1205   ALKPHOS 98 12/28/2013 1506   ALKPHOS 111 05/18/2013 1205   BILITOT 0.20 12/28/2013 1506   BILITOT 0.2* 05/18/2013 1205   GFRNONAA 82* 11/16/2013 0448   GFRAA >90 11/16/2013 0448     Impression:  The patient is tolerating radiotherapy.   Plan:  Continue radiotherapy as planned. Advised pt to discuss whether to start Xeloda when she sees Dr. Humphrey Rolls, in light of her chest wall scar wound.  -----------------------------------  [name], MD

## 2014-02-11 NOTE — Progress Notes (Signed)
Pt c/o generalized pain, and intermittent sharp pains in her left chest wall area. She is wearing Duragesic patch 100 mcg, states she alternates taking Oxy-IR with Tramadol. She states she gets nauseated after treatment every day. She takes Zofran and eats freq small meals to control her nausea.  Pt states she spoke w/company handling her Xeloda. She was told she will receive Xeloda tomorrow.

## 2014-02-12 ENCOUNTER — Ambulatory Visit
Admission: RE | Admit: 2014-02-12 | Discharge: 2014-02-12 | Disposition: A | Discharge status: Home / Self Care | Payer: PRIVATE HEALTH INSURANCE | Source: Ambulatory Visit | Attending: Radiation Oncology | Admitting: Radiation Oncology

## 2014-02-12 ENCOUNTER — Encounter: Payer: Self-pay | Admitting: Hematology and Oncology

## 2014-02-12 ENCOUNTER — Ambulatory Visit: Payer: PRIVATE HEALTH INSURANCE | Admitting: Oncology

## 2014-02-12 ENCOUNTER — Ambulatory Visit: Payer: PRIVATE HEALTH INSURANCE

## 2014-02-12 ENCOUNTER — Telehealth: Payer: Self-pay

## 2014-02-12 ENCOUNTER — Ambulatory Visit: Payer: No Typology Code available for payment source | Attending: General Surgery | Admitting: Physical Therapy

## 2014-02-12 ENCOUNTER — Other Ambulatory Visit: Payer: PRIVATE HEALTH INSURANCE

## 2014-02-12 ENCOUNTER — Other Ambulatory Visit (HOSPITAL_BASED_OUTPATIENT_CLINIC_OR_DEPARTMENT_OTHER): Payer: PRIVATE HEALTH INSURANCE

## 2014-02-12 ENCOUNTER — Ambulatory Visit (HOSPITAL_BASED_OUTPATIENT_CLINIC_OR_DEPARTMENT_OTHER): Payer: PRIVATE HEALTH INSURANCE | Admitting: Hematology and Oncology

## 2014-02-12 VITALS — BP 110/71 | HR 89 | Temp 98.0°F | Resp 18 | Ht 65.5 in | Wt 215.8 lb

## 2014-02-12 DIAGNOSIS — C50219 Malignant neoplasm of upper-inner quadrant of unspecified female breast: Secondary | ICD-10-CM

## 2014-02-12 DIAGNOSIS — D649 Anemia, unspecified: Secondary | ICD-10-CM

## 2014-02-12 DIAGNOSIS — Z86718 Personal history of other venous thrombosis and embolism: Secondary | ICD-10-CM

## 2014-02-12 DIAGNOSIS — G629 Polyneuropathy, unspecified: Secondary | ICD-10-CM

## 2014-02-12 DIAGNOSIS — M24519 Contracture, unspecified shoulder: Secondary | ICD-10-CM | POA: Insufficient documentation

## 2014-02-12 DIAGNOSIS — M25519 Pain in unspecified shoulder: Secondary | ICD-10-CM | POA: Insufficient documentation

## 2014-02-12 DIAGNOSIS — IMO0001 Reserved for inherently not codable concepts without codable children: Secondary | ICD-10-CM | POA: Insufficient documentation

## 2014-02-12 DIAGNOSIS — Z9010 Acquired absence of unspecified breast and nipple: Secondary | ICD-10-CM

## 2014-02-12 LAB — COMPREHENSIVE METABOLIC PANEL (CC13)
ALT: 15 U/L (ref 0–55)
AST: 21 U/L (ref 5–34)
Albumin: 3.4 g/dL — ABNORMAL LOW (ref 3.5–5.0)
Alkaline Phosphatase: 105 U/L (ref 40–150)
Anion Gap: 10 mEq/L (ref 3–11)
BUN: 6.7 mg/dL — AB (ref 7.0–26.0)
CALCIUM: 10 mg/dL (ref 8.4–10.4)
CHLORIDE: 103 meq/L (ref 98–109)
CO2: 28 mEq/L (ref 22–29)
CREATININE: 0.9 mg/dL (ref 0.6–1.1)
GLUCOSE: 112 mg/dL (ref 70–140)
Potassium: 3.2 mEq/L — ABNORMAL LOW (ref 3.5–5.1)
Sodium: 140 mEq/L (ref 136–145)
Total Bilirubin: 0.31 mg/dL (ref 0.20–1.20)
Total Protein: 8.2 g/dL (ref 6.4–8.3)

## 2014-02-12 LAB — CBC WITH DIFFERENTIAL/PLATELET
BASO%: 0.3 % (ref 0.0–2.0)
Basophils Absolute: 0 10*3/uL (ref 0.0–0.1)
EOS ABS: 0.2 10*3/uL (ref 0.0–0.5)
EOS%: 3.7 % (ref 0.0–7.0)
HCT: 35.6 % (ref 34.8–46.6)
HGB: 11.4 g/dL — ABNORMAL LOW (ref 11.6–15.9)
LYMPH%: 25.8 % (ref 14.0–49.7)
MCH: 27.5 pg (ref 25.1–34.0)
MCHC: 32.1 g/dL (ref 31.5–36.0)
MCV: 85.6 fL (ref 79.5–101.0)
MONO#: 0.2 10*3/uL (ref 0.1–0.9)
MONO%: 3.8 % (ref 0.0–14.0)
NEUT%: 66.4 % (ref 38.4–76.8)
NEUTROS ABS: 2.7 10*3/uL (ref 1.5–6.5)
Platelets: 270 10*3/uL (ref 145–400)
RBC: 4.15 10*6/uL (ref 3.70–5.45)
RDW: 16.8 % — AB (ref 11.2–14.5)
WBC: 4.1 10*3/uL (ref 3.9–10.3)
lymph#: 1.1 10*3/uL (ref 0.9–3.3)

## 2014-02-12 MED ORDER — FENTANYL 75 MCG/HR TD PT72: 75.0000 ug | MEDICATED_PATCH | TRANSDERMAL | Status: DC

## 2014-02-12 MED ORDER — FENTANYL 25 MCG/HR TD PT72: 25.0000 ug | MEDICATED_PATCH | TRANSDERMAL | Status: DC

## 2014-02-12 MED ORDER — GABAPENTIN 300 MG PO CAPS: 300.0000 mg | ORAL_CAPSULE | Freq: Three times a day (TID) | ORAL | Status: DC

## 2014-02-12 NOTE — Addendum Note (Signed)
Encounter addended by: [name], MD on: 02/12/2014  8:48 AM<BR>     Documentation filed: Notes Section

## 2014-02-12 NOTE — Progress Notes (Signed)
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    PRIOR THERAPY: 1.  left sided breast mass show up about 6 months prior to diagnosis. 2 months prior this began getting larger rapidly. She was seen in an urgent care where this mass was thought to be an abscess. Apparently there was an attempt to excise or drain this that was not successful and she was referred to Dr. [name] for evaluation. She underwent punch biopsy on 04/23/13 of her skin and the mass. She was then also sent for mm/us. The biopsy shows invasive high grade carcinoma that appears to be breast. It is ER 14%, PR negative, HER-2/neu negative with a Ki-67 that approaches 100%. She has also undergone mm with at least 10 cm upper inner left breast tumor with mild overyling skin thickening. There is also a 3x10 mm cluster of calcs near there also. There is indeterminate 7 mm level 1 left axillary node, she underwent a lymph node biopsy that was positive for disease. No mm evidence of right breast malignancy.  An MRI of the breasts was done on 05/22/13 and showed a 14 cm mass in the left breast protruding from the skin and also a 2.5 cm level II axillary node.   2.  S/P neoadjuvant chemotherapy.  Adriamycin and Cytoxan began on 05/25/13.  She completed 4 cycles with Neulasta support.   Taxol carbo began on 8/8. This was discontinued due to neuropathy, she started on Gemzar/Carbo on 8/22- 11/2013.  #3 staging PET/CT performed on 06/13/2013 revealed no evidence of distant disease.  #4 anemia of iron deficiency.  She has received parenteral iron in the past.  This was associated with menorrhagia, the patient has since been receiving Zoladex with great improvement of her hemoglobin and iron studies.   #5 Right upper extremity DVT: Patient is on Lovenox since July 2014 completed all  anti-coagulation in 12/14.  #6 status post left mastectomy with axillary lymph node dissection. Final pathology did reveal 6.5 cm residual disease 0 of 12 lymph nodes positive for metastatic disease. Tumor was estrogen receptor +14% progesterone receptor negative HER-2/neu negative  CURRENT THERAPY: begin concurrent radiation therapy and Xeloda  INTERVAL HISTORY: Guinevere Stephenson 43 y.o. female returns for followup visit today. Overall she is doing well. She does continue to complain of significant amount of pain in the left mastectomy site as well as all over.She describes pain ia stabbing in the midsternum/left chest wall area since surgery,recurrent 20-30 times daily. We have been treating this with analgesics such as fentanyl patches and morphine derivatives.  She denies any headaches double vision blurring of vision fevers chills or night sweats. She is just fatigued. She reports numbness and tingling of her fingers and feet  She has been seen by Dr. Pablo Ledger. She has  started radiation  about 2 weeks ago.She was given a prescription  for Capecitabine to be taken concurrently with  radiation .Apparently patient  Communicated several times with pharmacistt but due to insurance complexities she has not received the Capecitabine yet. She continues on Zoladex on a monthly basis.  MEDICAL HISTORY: Past Medical History  Diagnosis Date  . Breast cancer     Invasive High Grade Carcinoma  . Headache(784.0)     Hx: of Migraines  . Status post chemotherapy     Adriamycin and Cytoxan  .  Anxiety     ANXIOUS ABOUT HAVEING BIG SURGERY - MASTECTOMY  . 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 DVT (deep venous thrombosis)     RT ARM-PT ON LOVENOX  . Hx of iron deficiency anemia 11/26/13    ALLERGIES:  is allergic to doxycycline.  MEDICATIONS:  Current Outpatient Prescriptions  Medication Sig Dispense Refill  . cyclobenzaprine (FLEXERIL) 5 MG tablet TAKE 1 TABLET BY MOUTH THREE TIMES A DAY FOR  MUSCLE SPASMS  30 tablet  1  . fentaNYL (DURAGESIC - DOSED MCG/HR) 25 MCG/HR patch Place 1 patch (25 mcg total) onto the skin every 3 (three) days. Use with 75 mcg patch to equal 100 mcg.  10 patch  0  . fentaNYL (DURAGESIC - DOSED MCG/HR) 75 MCG/HR Place 1 patch (75 mcg total) onto the skin every 3 (three) days.  10 patch  0  . hyaluronate sodium (RADIAPLEXRX) GEL Apply 1 application topically 2 (two) times daily. Apply to breast after rad tx and bedtime daily      . ibuprofen (ADVIL,MOTRIN) 200 MG tablet Take 600-800 mg by mouth every 4 (four) hours as needed for mild pain or moderate pain.       Marland Kitchen lidocaine-prilocaine (EMLA) cream Apply 1 application topically every 14 (fourteen) days. Every other Friday prior to chemo      . LORazepam (ATIVAN) 0.5 MG tablet Take 0.5 mg by mouth every 6 (six) hours as needed for anxiety.      . non-metallic deodorant Jethro Poling) MISC Apply 1 application topically daily as needed.      . ondansetron (ZOFRAN) 8 MG tablet Take 1 tablet (8 mg total) by mouth every 8 (eight) hours as needed for nausea or vomiting.  30 tablet  0  . oxyCODONE (OXY IR/ROXICODONE) 5 MG immediate release tablet Take 1 tablet (5 mg total) by mouth every 12 (twelve) hours as needed for moderate pain or severe pain.  60 tablet  0  . PRESCRIPTION MEDICATION PT REPORTS RECEIVING A SHOT ONCE A MONTH AT [facility]ER CENTER TO STOP HER MENSTRUAL PERIODS - I SEE DOCUMENTATION IN OFFICE NOTES FROM CANCER CENTER THAT PT WAS GETTING ZOLADEX SQ EVERY 28 DAYS.      Marland Kitchen traMADol (ULTRAM) 50 MG tablet Take 1-2 tablets (50-100 mg total) by mouth every 6 (six) hours as needed.  30 tablet  1  . UNABLE TO FIND Rx: L8000- Post Surgical Bra (Quantity: 6) M4268- Non Silicone Breast prosthesis (Quantity: 1) Dx: 174.9; Left mastectomy  1 each  0  . zolpidem (AMBIEN) 5 MG tablet Take 5 mg by mouth at bedtime as needed for sleep.      . capecitabine (XELODA) 500 MG tablet Take 2 tabs every 12 hours with radiation therapy  120  tablet  3  . gabapentin (NEURONTIN) 300 MG capsule Take 1 capsule (300 mg total) by mouth 3 (three) times daily.  90 capsule  3   No current facility-administered medications for this visit.   Facility-Administered Medications Ordered in Other Visits  Medication Dose Route Frequency Provider Last Rate Last Dose  . topical emolient (BIAFINE) emulsion   Topical Daily [name], MD        SURGICAL HISTORY:  Past Surgical History  Procedure Laterality Date  . Tubal ligation    . Portacath placement Right 05/22/2013    Procedure: INSERTION PORT-A-CATH;  Surgeon: [name], MD;  Location: [facility];  Service: General;  Laterality: Right;  . Left breast needle core  biopsy Left 05/21/13  . Left breast needle core  biopsy Left 06/22/13  . Mastectomy modified radical Left 11/15/2013    Procedure: LEFT MASTECTOMY MODIFIED RADICAL;  Surgeon: [name], MD;  Location: [facility];  Service: General;  Laterality: Left;    REVIEW OF SYSTEMS:  A 10 point review of systems was conducted and is otherwise negative except for what is noted above.    PHYSICAL EXAMINATION: Blood pressure 110/71, pulse 89, temperature 98 F (36.7 C), temperature source Oral, resp. rate 18, height 5' 5.5" (1.664 m), weight 215 lb 12.8 oz (97.886 kg), last menstrual period 05/30/2013. Body mass index is 35.35 kg/(m^2). GENERAL: Patient is a well appearing female in no acute distress HEENT:  Sclerae anicteric.  Oropharynx clear and moist. No ulcerations or evidence of oropharyngeal candidiasis. Neck is supple.  NODES:  No cervical, supraclavicular, or axillary lymphadenopathy palpated.  BREAST EXAM: left chest wall incision almost completely healed, besides an approximate 4x2cm area laterally that is open, no drainage or any sign of infection,has partial reepithelization. LUNGS:  Clear to auscultation bilaterally.  No wheezes or rhonchi. HEART:  Regular rate and rhythm. No murmurs. ABDOMEN:  Soft, nontender.  Positive,  normoactive bowel sounds. No organomegaly . MSK:  No focal spinal tenderness to palpation. Full range of motion bilaterally in the upper extremities. EXTREMITIES:  No peripheral edema.   SKIN:  Clear with no obvious rashes or skin changes. No nail dyscrasia. NEURO:  Nonfocal. Well oriented.  Appropriate affect.Little anxious. ECOG PERFORMANCE STATUS: 1 - Symptomatic but completely ambulatory  LABORATORY DATA: Lab Results  Component Value Date   WBC 4.1 02/12/2014   HGB 11.4* 02/12/2014   HCT 35.6 02/12/2014   MCV 85.6 02/12/2014   PLT 270 02/12/2014      Chemistry      Component Value Date/Time   NA 140 02/12/2014 1313   NA 138 11/16/2013 0448   K 3.2* 02/12/2014 1313   K 3.5 11/16/2013 0448   CL 103 11/16/2013 0448   CL 102 06/01/2013 1500   CO2 28 02/12/2014 1313   CO2 27 11/16/2013 0448   BUN 6.7* 02/12/2014 1313   BUN 8 11/16/2013 0448   CREATININE 0.9 02/12/2014 1313   CREATININE 0.86 11/16/2013 0448      Component Value Date/Time   CALCIUM 10.0 02/12/2014 1313   CALCIUM 8.9 11/16/2013 0448   ALKPHOS 105 02/12/2014 1313   ALKPHOS 111 05/18/2013 1205   AST 21 02/12/2014 1313   AST 18 05/18/2013 1205   ALT 15 02/12/2014 1313   ALT 16 05/18/2013 1205   BILITOT 0.31 02/12/2014 1313   BILITOT 0.2* 05/18/2013 1205     ADDITIONAL INFORMATION: 1. CHROMOGENIC IN-SITU HYBRIDIZATION Results: HER-2/NEU BY CISH - NO AMPLIFICATION OF HER-2 DETECTED. RESULT RATIO OF HER2: CEP 17 SIGNALS 1.19 AVERAGE HER2 COPY NUMBER PER CELL 1.25 REFERENCE RANGE NEGATIVE HER2/Chr17 Ratio <2.0 and Average HER2 copy number <4.0 EQUIVOCAL HER2/Chr17 Ratio <2.0 and Average HER2 copy number 4.0 and <6.0 POSITIVE HER2/Chr17 Ratio >=2.0 and/or Average HER2 copy number >=6.0 Enid Cutter MD Pathologist, Electronic Signature ( Signed 11/23/2013) 1. PROGNOSTIC INDICATORS - ACIS Results: IMMUNOHISTOCHEMICAL AND MORPHOMETRIC ANALYSIS BY THE AUTOMATED CELLULAR IMAGING SYSTEM (ACIS) Estrogen Receptor: 0%, NEGATIVE Progesterone Receptor:  0%, NEGATIVE COMMENT: The negative hormone receptor study(ies) in this case have no internal positive control. REFERENCE RANGE ESTROGEN RECEPTOR NEGATIVE <1% POSITIVE =>1% PROGESTERONE RECEPTOR 1 of 4 FINAL for Wixon, Laiya ([id number]) ADDITIONAL INFORMATION:(continued) NEGATIVE <1% POSITIVE =>1% All controls stained appropriately  Enid Cutter MD Pathologist, Electronic Signature ( Signed 11/21/2013) FINAL DIAGNOSIS Diagnosis 1. Breast, modified radical mastectomy , left - RESIDUAL INVASIVE DUCTAL CARCINOMA, NOTTINGHAM COMBINED HISTOLOGIC GRADE III, 6.5 CM, 0.2 CM FROM THE INKED DEEP MARGIN. - ANGIOLYMPHATIC INVASION PRESENT. - EXTENSIVE POST- TREATMENT EFFECT. - TWELVE LYMPH NODES, NEGATIVE FOR RESIDUAL CARCINOMA (0/12). 2. Soft tissue mass, simple excision, left axillary - MATURE ADIPOSE TISSUE, NO LYMPHOID TISSUE IDENTIFIED. - NO EVIDENCE OF MALIGNANCY. Microscopic Comment 1. BREAST, INVASIVE TUMOR, WITH LYMPH NODE SAMPLING Specimen, including laterality and lymph node sampling (sentinel, non-sentinel): Left breast and axillary lymph nodes Procedure: Left modified radical mastectomy Histologic type: Invasive ductal carcinoma Grade: III Tubule formation: 3 Nuclear pleomorphism: 3 Mitotic: 3 Tumor size (gross measurement): residual invasive tumor,6.5 cm. Margins: Negative Invasive, distance to closest margin: Deep margin, 0.2 cm In-situ, distance to closest margin: N/A Lymphovascular invasion: Present Ductal carcinoma in situ: Not identified Lobular neoplasia: N/A Tumor focality: unifocal Treatment effect: Present If present, treatment effect in breast tissue, lymph nodes or both: In both lymph nodes and breast tissue, extensively Extent of tumor: Skin: Dermis involved with edematous changes grossly Nipple: Not involved Skeletal muscle: N/A Lymph nodes: Examined: 0 Sentinel 12 Non-sentinel 2 of 4 FINAL for Bitton, Shelsie ([id number]) Microscopic  Comment(continued) 12 Total Lymph nodes with metastasis: 0 Isolated tumor cells (< 0.2 mm): 0 Micrometastasis: (> 0.2 mm and < 2.0 mm): 0 Macrometastasis: (> 2.0 mm): 0 Extracapsular extension: 0 Breast prognostic profile: Will be repeated and an addendum report will follow. Please correlate with previous case [id number]. Estrogen receptor: 14%, positive, moderate staining intensity Progesterone receptor: 0%, negative Her 2 neu: No amplification of Her 2 neu detected. Ratio of Her 2:CEP 17 signals is 0.90. Average Her 2 copy number per cell is 1.75 Ki-67: 100% Non-neoplastic breast: Extensive post-treatment effect. TNM: y pT4b, ypN0 (HCL:kh 11/19/13 RADIOGRAPHIC STUDIES:  Chest 2 View  05/18/2013   *RADIOLOGY REPORT*  Clinical Data: Breast carcinoma  CHEST - 2 VIEW  Comparison: None.  Findings: Probable left breast mass. Lungs clear.  Heart size and pulmonary vascularity normal.  No effusion.  Visualized bones unremarkable.  IMPRESSION: No acute disease   Original Report Authenticated By: D. Wallace Going, MD   US Breast Left  05/08/2013   *RADIOLOGY REPORT*  Clinical Data:  43 year old female with large left breast mass - recent punch biopsy demonstrating high-grade carcinoma.  DIGITAL DIAGNOSTIC BILATERAL MAMMOGRAM WITH CAD AND LEFT BREAST ULTRASOUND:  Comparison:  None.  Findings:  ACR Breast Density Category 2: There is a scattered fibroglandular pattern.  A large irregular mass within the upper inner posterior left breast is identified - measuring at least 10 cm. Mild overlying skin thickening is identified compatible with dermal invasion. A 3 x 10 mm cluster of slightly heterogeneous left breast calcifications approximate 4 cm anterior and lateral to this mass is identified.  There is no evidence of suspicious mass, distortion or worrisome calcifications within the right breast. Mammographic images were processed with CAD.  On physical exam, a very large protuberant mass within the upper  inner left breast is identified.  Ultrasound is performed, showing a large heterogeneous mass within the upper inner left breast at the 10-11 o'clock position, measuring at least 6.5 cm, but evaluation is limited by transducer skin area and protuberant portion of the mass. A 7 mm level I left axillary lymph node is identified with slightly echogenic rim - suspicious.  IMPRESSION: Large biopsy-proven carcinoma within the upper inner left breast, measuring at  least 10 cm mammographically. Mild overlying skin thickening compatible with dermal invasion.  3 x 10 mm cluster of indeterminate left breast calcifications 4 cm anterior and lateral to the biopsy-proven neoplasm.  This may represent DCIS and consider tissue sampling.  Indeterminate 7 mm level I left axillary lymph node - metastatic disease not excluded.  No mammographic evidence of right breast malignancy.  BI-RADS CATEGORY 6:  Known biopsy-proven malignancy - appropriate action should be taken.  RECOMMENDATION: Treatment plan.  Consider biopsies of the indeterminate left axillary lymph node and left breast calcifications as clinically indicated.  Consider bilateral breast MRI as clinically indicated.  I have discussed the findings and recommendations with the patient. Results were also provided in writing at the conclusion of the visit.   Original Report Authenticated By: Margarette Canada, M.D.   Mr Breast Bilateral W Wo Contrast  05/22/2013   *RADIOLOGY REPORT*  Clinical Data: large invasive carcinoma left upper inner quadrant with dermal involvement  BILATERAL BREAST MRI WITH AND WITHOUT CONTRAST  Technique: Multiplanar, multisequence MR images of both breasts were obtained prior to and following the intravenous administration of 70ml of Multihance.  Three dimensional images were evaluated at the independent DynaCad workstation.  Comparison:  05/08/13  Findings: There is moderate background parenchymal enhancement. The right breast and axilla are negative.  On  the left, there is a large irregular mass with irregular borders in the upper inner quadrant, demonstrating heterogenous internal enhancement.  The mass measures 14 (AP x 7.5 (lateral) x 8 (craniocaudal)cm.  It causes protrusion of the skin of the breast in the 10 - 11 o'clock position, and it is associated with edematous change in the skin of the left breast, primarily in the upper inner quadrant but also in the periareolar in more infra- areolar left breast.  The mass extends through pectoralis major musculature posteriorly, with enhancing mass extending all the way to the posterior edge of the muscle.  In the 2 o'clock position posteriorly there is a 48mm oval enhancing mass with a mixture of plateau and washout kinetics; it is an indeterminate finding but shows the suggestion of a fatty hilum, and it may be an intramammary lymph node.  In the left axilla, there is an enlarged level II lymph node demonstrating hypervascularity and cortical thickening.  It measures 2.5cm in greatest dimension, and the appearance is concerning for metastasis.  There are no other abnormalities on the left.  IMPRESSION: Large left breast malignancy associated with involvement of the dermis and the chest wall, as well as with probable axillary lymph node metastasis.  There is an indeterminate 36mm mass in the 2 o'clock position, that may be an intramammary lymph node.  BI-RADS CATEGORY 6:  Known biopsy-proven malignancy - appropriate action should be taken.  THREE-DIMENSIONAL MR IMAGE RENDERING ON INDEPENDENT WORKSTATION:  Three-dimensional MR images were rendered by post-processing of the original MR data on an independent workstation.  The three- dimensional MR images were interpreted, and findings were reported in the accompanying complete MRI report for this study.   Original Report Authenticated By: Skipper Cliche, M.D.   Dg Chest Port 1 View  05/22/2013   *RADIOLOGY REPORT*  Clinical Data: Port-A-Cath placement.  PORTABLE CHEST  - 1 VIEW  Comparison: 05/18/2013  Findings: Right-sided power injectable Port-A-Cath noted with distal tip projecting over the SVC.  Low lung volumes are present, causing crowding of the pulmonary vasculature.  No pneumothorax noted.  Bilateral midlung subsegmental atelectasis noted.  Borderline cardiomegaly.  IMPRESSION:  1.  Power injectable catheter tip:  SVC.  No pneumothorax.   Original Report Authenticated By: Van Clines, M.D.   Mm Digital Diagnostic Bilat  05/08/2013   *RADIOLOGY REPORT*  Clinical Data:  43 year old female with large left breast mass - recent punch biopsy demonstrating high-grade carcinoma.  DIGITAL DIAGNOSTIC BILATERAL MAMMOGRAM WITH CAD AND LEFT BREAST ULTRASOUND:  Comparison:  None.  Findings:  ACR Breast Density Category 2: There is a scattered fibroglandular pattern.  A large irregular mass within the upper inner posterior left breast is identified - measuring at least 10 cm. Mild overlying skin thickening is identified compatible with dermal invasion. A 3 x 10 mm cluster of slightly heterogeneous left breast calcifications approximate 4 cm anterior and lateral to this mass is identified.  There is no evidence of suspicious mass, distortion or worrisome calcifications within the right breast. Mammographic images were processed with CAD.  On physical exam, a very large protuberant mass within the upper inner left breast is identified.  Ultrasound is performed, showing a large heterogeneous mass within the upper inner left breast at the 10-11 o'clock position, measuring at least 6.5 cm, but evaluation is limited by transducer skin area and protuberant portion of the mass. A 7 mm level I left axillary lymph node is identified with slightly echogenic rim - suspicious.  IMPRESSION: Large biopsy-proven carcinoma within the upper inner left breast, measuring at least 10 cm mammographically. Mild overlying skin thickening compatible with dermal invasion.  3 x 10 mm cluster of  indeterminate left breast calcifications 4 cm anterior and lateral to the biopsy-proven neoplasm.  This may represent DCIS and consider tissue sampling.  Indeterminate 7 mm level I left axillary lymph node - metastatic disease not excluded.  No mammographic evidence of right breast malignancy.  BI-RADS CATEGORY 6:  Known biopsy-proven malignancy - appropriate action should be taken.  RECOMMENDATION: Treatment plan.  Consider biopsies of the indeterminate left axillary lymph node and left breast calcifications as clinically indicated.  Consider bilateral breast MRI as clinically indicated.  I have discussed the findings and recommendations with the patient. Results were also provided in writing at the conclusion of the visit.   Original Report Authenticated By: Margarette Canada, M.D.   Dg Fluoro Guide Cv Line-no Report  05/22/2013   CLINICAL DATA: surgery   FLOURO GUIDE CV LINE  Fluoroscopy was utilized by the requesting physician.  No radiographic  interpretation.    [place] Lt Breast Bx W Loc Dev 1st Lesion Img Bx Spec US Guide  05/22/2013   **ADDENDUM** CREATED: 05/22/2013 11:12:58  The patient was called by myself, Dr. Derrel Nip today.  The patient states she is doing well since her left axillary node biopsy without problems at the biopsy site.  The patient was given the results of her biopsy which was compatible with metastatic invasive mammary carcinoma.  This is concordant with imaging findings and patient's known history of recent positive biopsy of a large inner upper left breast mass demonstrating invasive mammary carcinoma. The patient had her breast MRI this morning and will receive Port-A- Cath placement by Dr. [name] this afternoon.  Recommend follow- up pending MRI results and per treatment plan.  **END ADDENDUM** SIGNED BY: Melene Plan. Derrel Nip, M.D.  05/21/2013   *RADIOLOGY REPORT*  Clinical Data:  New diagnosis of invasive mammary carcinoma in the left upper inner quadrant.  Abnormal lymph node noted on  ultrasound dated 05/08/2013.  ULTRASOUND GUIDED CORE BIOPSY OF THE LEFT AXILLA  The patient and I discussed the procedure  of ultrasound-guided biopsy, including benefits and alternatives.  We discussed the high likelihood of a successful procedure. We discussed the risks of the procedure, including infection, bleeding, tissue injury, clip migration, and inadequate sampling.  Written informed consent was given. Appropriate time-out was performed.  Using sterile technique, 2% lidocaine, ultrasound guidance, and a 14 gauge automated biopsy device, biopsy was performed of the abnormal left axillary lymph node using a lateromedial approach. At the conclusion of the procedure, a dumbbell shaped tissue marker clip was deployed into the biopsy cavity. Post biopsy mammogram was not performed.  IMPRESSION: Ultrasound guided biopsy of an abnormal left axillary lymph node. No apparent complications.  Original Report Authenticated By: Ulyess Blossom, M.D.    ASSESSMENT/PLAN:   1.Stage IIIB (T4N1) high grade invasive breast carcinoma ER 14%, PR negative, HER-2/neu negative. Status post neoadjuvant chemotherapy consisting of Adriamycin and Cytoxan given dose dense x4 cycles from 05/25/2013 through 07/13/2013. She then started paclitaxel and carboplatinum but received only one cycle from 07/20/2013 and discontinued on 07/27/2013. She could not tolerate this. She was switched to gemcitabine and carboplatinum given dose dense for total of 6 cycles from 08/03/2013 through November 2014. She tolerated this well. She has gone on to have a mastectomy by Dr. [name] performed on 11/15/2013. The final pathology revealed 6.5 cm residual disease with 0 12 lymph nodes positive for metastatic disease.  #2 patient has been seen by Dr. Thea Silversmith  ongoing post mastectomy radiation therapy. Patient and Dr.Khan had discussed radiosensitizing chemotherapy consisting of Xeloda 1000 mg twice a day. Discussed rationale for this  and the prescription has been sent to her pharmacy. Our nurse today made repeat contacts to clarify the problem with Xeloda and the reason patient has not received the medication yet.Apparently her insurance will cover and not Medicaid,if the patient will pay her copay which is about 400 dollars. Dr.Wentworth has suggested to hold on Xeloda due to the non healing area of her mastectomy incision.This appears to be due to the prosthesis patient previously used rubing against her mastectomy incision that never well healed as a result.  #3 DVT: Patient has completed all of her anticoagulation she is now off of Lovenox.  #4 anemia: stable  #5 chronic pain: Patient is having a lot of pain postmastectomy. We discussed ways to manage it. She is on a fentanyl patch we will continue this. She also has breakthrough medications and given refill on fentanyl patches 25 and 75 strength as well. She has neuropathy type symptoms and Neurontin 300 mg 3 times daily was prescribed today that might help with the chest wall pain as well.  #6 we will plan on seeing the patient back in about 4 weeks' time for followup  CBC 2 weeks after patient starts Xeloda?  Continue on Zoladex every month for now.  All questions were answered. The patient knows to call the clinic with any problems, questions or concerns. We can certainly see the patient much sooner if necessary.  I spent 25 minutes counseling the patient face to face. The total time spent in the appointment was 30 minutes.  Marland KitchenVicente Males

## 2014-02-12 NOTE — Progress Notes (Signed)
Amada Kingfisher, M.D. Oncology/Hematology [place] [phone] (Office)

## 2014-02-12 NOTE — Telephone Encounter (Signed)
Called patient's insurance about xeloda.  Insurance company stated that Xeloda is ready to be shipped but patient needs to pay a copay and set up a delivery time.  Copay is $390 and patient is unsure she is able to pay this.  [employer] agent said she can set up a payment plan. Explained this to patient and she stated she would call insurance company back this afternoon.

## 2014-02-13 ENCOUNTER — Other Ambulatory Visit: Payer: Self-pay

## 2014-02-13 ENCOUNTER — Telehealth: Payer: Self-pay | Admitting: Oncology

## 2014-02-13 ENCOUNTER — Ambulatory Visit
Admission: RE | Admit: 2014-02-13 | Discharge: 2014-02-13 | Disposition: A | Discharge status: Home / Self Care | Payer: PRIVATE HEALTH INSURANCE | Source: Ambulatory Visit | Attending: Radiation Oncology | Admitting: Radiation Oncology

## 2014-02-13 ENCOUNTER — Ambulatory Visit: Payer: PRIVATE HEALTH INSURANCE

## 2014-02-13 MED ORDER — POTASSIUM CHLORIDE CRYS ER 20 MEQ PO TBCR: 20.0000 meq | EXTENDED_RELEASE_TABLET | Freq: Two times a day (BID) | ORAL | Status: DC

## 2014-02-13 NOTE — Telephone Encounter (Signed)
lvm for pt regarding to March and April appt....mailed pt appt sched/avs and letter °

## 2014-02-13 NOTE — Telephone Encounter (Signed)
Patient is to take 27meq potassium BID x3days and then 1 a day until her next visit per Dr Owens Loffler.  The patient wanted to know when she could start her Xeloda.  Per email from Dr Isidore Moos, they are waiting for her wound to heal.  Patient was advised to ask at her radiation appointment tomorrow when they want her to start.  Patient voiced her understanding and will call back with any questions.

## 2014-02-13 NOTE — Telephone Encounter (Signed)
gv adn printed appt sched adn avs forpt for March adn April °

## 2014-02-14 ENCOUNTER — Ambulatory Visit: Payer: No Typology Code available for payment source

## 2014-02-14 ENCOUNTER — Ambulatory Visit
Admission: RE | Admit: 2014-02-14 | Discharge: 2014-02-14 | Disposition: A | Discharge status: Home / Self Care | Payer: PRIVATE HEALTH INSURANCE | Source: Ambulatory Visit | Attending: Radiation Oncology | Admitting: Radiation Oncology

## 2014-02-14 ENCOUNTER — Ambulatory Visit: Payer: No Typology Code available for payment source | Admitting: Nutrition

## 2014-02-14 ENCOUNTER — Ambulatory Visit: Payer: PRIVATE HEALTH INSURANCE

## 2014-02-14 NOTE — Progress Notes (Signed)
43 year old female diagnosed with breast cancer.  Past medical history includes anxiety, DVT, neuropathy, chemotherapy.  Medications include Xeloda, Ativan, and Zofran.  Labs include potassium 3.2, BUN 6.7, and albumin 3.4 on March 3.  Height: 65.5 inches. Weight: 215.8 pounds. Usual body weight: 251 pounds December 2014. BMI: 35.35.  Patient reports she is experiencing nausea on a daily basis.  She does try to take her nausea medication but vomits it up often.  She reports she has had some issues with healing and has had a nonhealing mastectomy incision.  She verbalizes she needs to eat more protein.  Patient is trying to eat a healthier diet and would like more information.  Nutrition diagnosis: Food and nutrition related knowledge deficit related to new diagnosis of breast cancer and associated treatments as evidenced by no prior need for nutrition related information.  Intervention: Patient was educated on a healthy, plant-based diet with adequate calories and protein to promote maintenance of lean body mass and healing. Patient was educated on ways to add protein every 3 hours during the day to her snacks.  Patient was educated on strategies for improving nausea and foods to choose if she did have nausea and vomiting.  Questions were answered and teach back method was used.  Monitoring, evaluation, goals: Patient will tolerate a healthy, plant-based diet to maintain lean body mass and promote healing.  Next visit: Patient will contact me if she has questions or concerns.

## 2014-02-15 ENCOUNTER — Ambulatory Visit: Payer: PRIVATE HEALTH INSURANCE

## 2014-02-15 ENCOUNTER — Ambulatory Visit
Admission: RE | Admit: 2014-02-15 | Discharge: 2014-02-15 | Disposition: A | Discharge status: Home / Self Care | Payer: PRIVATE HEALTH INSURANCE | Source: Ambulatory Visit | Attending: Radiation Oncology | Admitting: Radiation Oncology

## 2014-02-18 ENCOUNTER — Ambulatory Visit
Admission: RE | Admit: 2014-02-18 | Discharge: 2014-02-18 | Disposition: A | Discharge status: Home / Self Care | Payer: PRIVATE HEALTH INSURANCE | Source: Ambulatory Visit | Attending: Radiation Oncology | Admitting: Radiation Oncology

## 2014-02-18 ENCOUNTER — Encounter: Payer: Self-pay | Admitting: Radiation Oncology

## 2014-02-18 VITALS — BP 122/83 | HR 85 | Temp 98.6°F | Resp 20 | Wt 220.0 lb

## 2014-02-18 DIAGNOSIS — C50219 Malignant neoplasm of upper-inner quadrant of unspecified female breast: Secondary | ICD-10-CM

## 2014-02-18 NOTE — Progress Notes (Addendum)
Weekly rad tx left breast chest wall, has a pea size open area  Left chest wall incision with drainage,using neosporin on that area and telfa dressing,  Uses radiaplex gel bid elsewhere., appetite fair, eats small amounts every 3 hours, drinking more water, gave extra telfa pads for patient, has Xeloda  Capsules pain $300,00 out of pocket herself, hasn't started taking as yet, she stated,"No one has told me when to start takingg" , duragesic patches 71mcg & 30mcg on right breast "helping her pain stated patient", nausea has gotten better takes her nausea med bid 8am and 4pm 3:08 PM

## 2014-02-18 NOTE — Progress Notes (Signed)
   Weekly Management Note:  Outpatient Current Dose:  20 Gy  Projected Dose: 60 Gy   Narrative:  The patient presents for routine under treatment assessment.  CBCT/MVCT images/Port film x-rays were reviewed.  The chart was checked. Received her Xeloda.  Nausea improving with small meals and anti emetics.  Had some nausea preceding RT, but it got worse after starting RT.  Physical Findings:  weight is 220 lb (99.791 kg). Her oral temperature is 98.6 F (37 C). Her blood pressure is 122/83 and her pulse is 85. Her respiration is 20.  NAD, chest wall scar wound stable. Mild hyperpigmentation of L chest wall skin.  CBC    Component Value Date/Time   WBC 4.1 02/12/2014 1313   WBC 7.6 11/17/2013 0540   RBC 4.15 02/12/2014 1313   RBC 2.83* 11/17/2013 0540   HGB 11.4* 02/12/2014 1313   HGB 8.9* 11/17/2013 0540   HCT 35.6 02/12/2014 1313   HCT 26.8* 11/17/2013 0540   PLT 270 02/12/2014 1313   PLT 232 11/17/2013 0540   MCV 85.6 02/12/2014 1313   MCV 94.7 11/17/2013 0540   MCH 27.5 02/12/2014 1313   MCH 31.4 11/17/2013 0540   MCHC 32.1 02/12/2014 1313   MCHC 33.2 11/17/2013 0540   RDW 16.8* 02/12/2014 1313   RDW 15.9* 11/17/2013 0540   LYMPHSABS 1.1 02/12/2014 1313   LYMPHSABS 2.8 11/06/2013 1030   MONOABS 0.2 02/12/2014 1313   MONOABS 0.5 11/06/2013 1030   EOSABS 0.2 02/12/2014 1313   EOSABS 0.2 11/06/2013 1030   BASOSABS 0.0 02/12/2014 1313   BASOSABS 0.0 11/06/2013 1030    CMP     Component Value Date/Time   NA 140 02/12/2014 1313   NA 138 11/16/2013 0448   K 3.2* 02/12/2014 1313   K 3.5 11/16/2013 0448   CL 103 11/16/2013 0448   CL 102 06/01/2013 1500   CO2 28 02/12/2014 1313   CO2 27 11/16/2013 0448   GLUCOSE 112 02/12/2014 1313   GLUCOSE 116* 11/16/2013 0448   GLUCOSE 95 06/01/2013 1500   BUN 6.7* 02/12/2014 1313   BUN 8 11/16/2013 0448   CREATININE 0.9 02/12/2014 1313   CREATININE 0.86 11/16/2013 0448   CALCIUM 10.0 02/12/2014 1313   CALCIUM 8.9 11/16/2013 0448   PROT 8.2 02/12/2014 1313   PROT 8.2 05/18/2013 1205   ALBUMIN 3.4* 02/12/2014 1313   ALBUMIN 2.8* 05/18/2013 1205   AST 21 02/12/2014 1313   AST 18 05/18/2013 1205   ALT 15 02/12/2014 1313   ALT 16 05/18/2013 1205   ALKPHOS 105 02/12/2014 1313   ALKPHOS 111 05/18/2013 1205   BILITOT 0.31 02/12/2014 1313   BILITOT 0.2* 05/18/2013 1205   GFRNONAA 82* 11/16/2013 0448   GFRAA >90 11/16/2013 0448     Impression:  The patient is tolerating radiotherapy.  Plan:  Continue radiotherapy as planned. I spoke with Dr Owens Loffler - we concur it is prudent for her to start Xeloda now that she has it.  [name] to begin taking it.  Nausea could be due to small amount of L upper abdomen being in the treatment fields.  She seems to be managing it relatively well right now.  Will continue to follow wound, continue neosporin.  ________________________________   [name], M.D.

## 2014-02-19 ENCOUNTER — Ambulatory Visit: Payer: No Typology Code available for payment source | Admitting: Physical Therapy

## 2014-02-19 ENCOUNTER — Ambulatory Visit
Admission: RE | Admit: 2014-02-19 | Discharge: 2014-02-19 | Disposition: A | Discharge status: Home / Self Care | Payer: PRIVATE HEALTH INSURANCE | Source: Ambulatory Visit | Attending: Radiation Oncology | Admitting: Radiation Oncology

## 2014-02-19 ENCOUNTER — Ambulatory Visit: Payer: PRIVATE HEALTH INSURANCE

## 2014-02-20 ENCOUNTER — Ambulatory Visit
Admission: RE | Admit: 2014-02-20 | Discharge: 2014-02-20 | Disposition: A | Discharge status: Home / Self Care | Payer: PRIVATE HEALTH INSURANCE | Source: Ambulatory Visit | Attending: Radiation Oncology | Admitting: Radiation Oncology

## 2014-02-21 ENCOUNTER — Ambulatory Visit: Payer: No Typology Code available for payment source

## 2014-02-21 ENCOUNTER — Ambulatory Visit: Payer: PRIVATE HEALTH INSURANCE

## 2014-02-21 ENCOUNTER — Ambulatory Visit
Admission: RE | Admit: 2014-02-21 | Discharge: 2014-02-21 | Disposition: A | Discharge status: Home / Self Care | Payer: PRIVATE HEALTH INSURANCE | Source: Ambulatory Visit | Attending: Radiation Oncology | Admitting: Radiation Oncology

## 2014-02-22 ENCOUNTER — Ambulatory Visit: Payer: PRIVATE HEALTH INSURANCE

## 2014-02-22 ENCOUNTER — Ambulatory Visit
Admission: RE | Admit: 2014-02-22 | Discharge: 2014-02-22 | Disposition: A | Discharge status: Home / Self Care | Payer: PRIVATE HEALTH INSURANCE | Source: Ambulatory Visit | Attending: Radiation Oncology | Admitting: Radiation Oncology

## 2014-02-25 ENCOUNTER — Ambulatory Visit: Payer: PRIVATE HEALTH INSURANCE

## 2014-02-25 ENCOUNTER — Ambulatory Visit
Admission: RE | Admit: 2014-02-25 | Discharge: 2014-02-25 | Disposition: A | Discharge status: Home / Self Care | Payer: PRIVATE HEALTH INSURANCE | Source: Ambulatory Visit | Attending: Radiation Oncology | Admitting: Radiation Oncology

## 2014-02-25 ENCOUNTER — Encounter: Payer: Self-pay | Admitting: Radiation Oncology

## 2014-02-25 VITALS — BP 105/77 | HR 73 | Temp 98.6°F | Resp 20 | Wt 218.4 lb

## 2014-02-25 DIAGNOSIS — C50219 Malignant neoplasm of upper-inner quadrant of unspecified female breast: Secondary | ICD-10-CM

## 2014-02-25 NOTE — Progress Notes (Signed)
Patient seen in the back on treatment area by Dr.Squire, vitals taken, patient denys pain except in her back and occasional stinging in left chest wall area, using radiaplex bid stated,"going to physical therapy today for my back," in good spirits 3:16 PM

## 2014-02-25 NOTE — Progress Notes (Signed)
   Weekly Management Note:  outpatient Current Dose:  30 Gy  Projected Dose: 60 Gy   Narrative:  The patient presents for routine under treatment assessment.  CBCT/MVCT images/Port film x-rays were reviewed.  The chart was checked. No new complaints  Physical Findings:  weight is 218 lb 6.4 oz (99.066 kg). Her oral temperature is 98.6 F (37 C). Her blood pressure is 105/77 and her pulse is 73. Her respiration is 20.  wound is drier that last week, same size  Impression:  The patient is tolerating radiotherapy.  Plan:  Continue radiotherapy as planned.   ________________________________   [name], M.D.

## 2014-02-26 ENCOUNTER — Ambulatory Visit: Payer: No Typology Code available for payment source | Admitting: Physical Therapy

## 2014-02-26 ENCOUNTER — Ambulatory Visit
Admission: RE | Admit: 2014-02-26 | Discharge: 2014-02-26 | Disposition: A | Discharge status: Home / Self Care | Payer: PRIVATE HEALTH INSURANCE | Source: Ambulatory Visit | Attending: Radiation Oncology | Admitting: Radiation Oncology

## 2014-02-26 ENCOUNTER — Other Ambulatory Visit (HOSPITAL_BASED_OUTPATIENT_CLINIC_OR_DEPARTMENT_OTHER): Payer: PRIVATE HEALTH INSURANCE

## 2014-02-26 DIAGNOSIS — C50219 Malignant neoplasm of upper-inner quadrant of unspecified female breast: Secondary | ICD-10-CM

## 2014-02-26 DIAGNOSIS — Z9010 Acquired absence of unspecified breast and nipple: Secondary | ICD-10-CM

## 2014-02-26 LAB — CBC WITH DIFFERENTIAL/PLATELET
BASO%: 0.2 % (ref 0.0–2.0)
Basophils Absolute: 0 10*3/uL (ref 0.0–0.1)
EOS ABS: 0.2 10*3/uL (ref 0.0–0.5)
EOS%: 4.2 % (ref 0.0–7.0)
HCT: 36.4 % (ref 34.8–46.6)
HGB: 11.8 g/dL (ref 11.6–15.9)
LYMPH%: 20.2 % (ref 14.0–49.7)
MCH: 27.2 pg (ref 25.1–34.0)
MCHC: 32.4 g/dL (ref 31.5–36.0)
MCV: 83.9 fL (ref 79.5–101.0)
MONO#: 0.3 10*3/uL (ref 0.1–0.9)
MONO%: 8.4 % (ref 0.0–14.0)
NEUT#: 2.7 10*3/uL (ref 1.5–6.5)
NEUT%: 67 % (ref 38.4–76.8)
NRBC: 0 % (ref 0–0)
PLATELETS: 257 10*3/uL (ref 145–400)
RBC: 4.34 10*6/uL (ref 3.70–5.45)
RDW: 16.1 % — ABNORMAL HIGH (ref 11.2–14.5)
WBC: 4.1 10*3/uL (ref 3.9–10.3)
lymph#: 0.8 10*3/uL — ABNORMAL LOW (ref 0.9–3.3)

## 2014-02-27 ENCOUNTER — Ambulatory Visit
Admission: RE | Admit: 2014-02-27 | Discharge: 2014-02-27 | Disposition: A | Discharge status: Home / Self Care | Payer: PRIVATE HEALTH INSURANCE | Source: Ambulatory Visit | Attending: Radiation Oncology | Admitting: Radiation Oncology

## 2014-02-27 ENCOUNTER — Ambulatory Visit: Payer: PRIVATE HEALTH INSURANCE

## 2014-02-28 ENCOUNTER — Ambulatory Visit: Payer: PRIVATE HEALTH INSURANCE

## 2014-02-28 ENCOUNTER — Ambulatory Visit
Admission: RE | Admit: 2014-02-28 | Discharge: 2014-02-28 | Disposition: A | Discharge status: Home / Self Care | Payer: PRIVATE HEALTH INSURANCE | Source: Ambulatory Visit | Attending: Radiation Oncology | Admitting: Radiation Oncology

## 2014-03-01 ENCOUNTER — Encounter: Payer: Self-pay | Admitting: Radiation Oncology

## 2014-03-01 ENCOUNTER — Ambulatory Visit
Admission: RE | Admit: 2014-03-01 | Discharge: 2014-03-01 | Disposition: A | Discharge status: Home / Self Care | Payer: PRIVATE HEALTH INSURANCE | Source: Ambulatory Visit | Attending: Radiation Oncology | Admitting: Radiation Oncology

## 2014-03-01 ENCOUNTER — Ambulatory Visit: Payer: PRIVATE HEALTH INSURANCE

## 2014-03-04 ENCOUNTER — Ambulatory Visit
Admission: RE | Admit: 2014-03-04 | Discharge: 2014-03-04 | Disposition: A | Discharge status: Home / Self Care | Payer: PRIVATE HEALTH INSURANCE | Source: Ambulatory Visit | Attending: Radiation Oncology | Admitting: Radiation Oncology

## 2014-03-04 ENCOUNTER — Inpatient Hospital Stay
Admission: RE | Admit: 2014-03-04 | Discharge: 2014-03-04 | Disposition: A | Discharge status: Home / Self Care | Payer: No Typology Code available for payment source | Source: Ambulatory Visit | Attending: Radiation Oncology | Admitting: Radiation Oncology

## 2014-03-04 ENCOUNTER — Ambulatory Visit: Admission: RE | Admit: 2014-03-04 | Payer: PRIVATE HEALTH INSURANCE | Source: Ambulatory Visit

## 2014-03-04 ENCOUNTER — Encounter: Payer: Self-pay | Admitting: Radiation Oncology

## 2014-03-04 VITALS — BP 102/77 | HR 76 | Temp 97.5°F | Resp 20 | Wt 217.9 lb

## 2014-03-04 DIAGNOSIS — C50219 Malignant neoplasm of upper-inner quadrant of unspecified female breast: Secondary | ICD-10-CM

## 2014-03-04 NOTE — Progress Notes (Signed)
   Weekly Management Note:  Outpatient Current Dose:  40 Gy  Projected Dose: 60 Gy   Narrative:  The patient presents for routine under treatment assessment.  CBCT/MVCT images/Port film x-rays were reviewed.  The chart was checked. No new complaints.  Taking Xeloda.  Nausea stable to improved. No diarrhea or new rash. Getting labs in med/onc. Skin relatively stable. Wound drying  Physical Findings:  weight is 217 lb 14.4 oz (98.839 kg). Her oral temperature is 97.5 F (36.4 C). Her blood pressure is 102/77 and her pulse is 76. Her respiration is 20.  incisional wound is dry. Skin otherwise intact, and hyperpigmented.  CBC    Component Value Date/Time   WBC 4.1 02/26/2014 1322   WBC 7.6 11/17/2013 0540   RBC 4.34 02/26/2014 1322   RBC 2.83* 11/17/2013 0540   HGB 11.8 02/26/2014 1322   HGB 8.9* 11/17/2013 0540   HCT 36.4 02/26/2014 1322   HCT 26.8* 11/17/2013 0540   PLT 257 02/26/2014 1322   PLT 232 11/17/2013 0540   MCV 83.9 02/26/2014 1322   MCV 94.7 11/17/2013 0540   MCH 27.2 02/26/2014 1322   MCH 31.4 11/17/2013 0540   MCHC 32.4 02/26/2014 1322   MCHC 33.2 11/17/2013 0540   RDW 16.1* 02/26/2014 1322   RDW 15.9* 11/17/2013 0540   LYMPHSABS 0.8* 02/26/2014 1322   LYMPHSABS 2.8 11/06/2013 1030   MONOABS 0.3 02/26/2014 1322   MONOABS 0.5 11/06/2013 1030   EOSABS 0.2 02/26/2014 1322   EOSABS 0.2 11/06/2013 1030   BASOSABS 0.0 02/26/2014 1322   BASOSABS 0.0 11/06/2013 1030     CMP     Component Value Date/Time   NA 140 02/12/2014 1313   NA 138 11/16/2013 0448   K 3.2* 02/12/2014 1313   K 3.5 11/16/2013 0448   CL 103 11/16/2013 0448   CL 102 06/01/2013 1500   CO2 28 02/12/2014 1313   CO2 27 11/16/2013 0448   GLUCOSE 112 02/12/2014 1313   GLUCOSE 116* 11/16/2013 0448   GLUCOSE 95 06/01/2013 1500   BUN 6.7* 02/12/2014 1313   BUN 8 11/16/2013 0448   CREATININE 0.9 02/12/2014 1313   CREATININE 0.86 11/16/2013 0448   CALCIUM 10.0 02/12/2014 1313   CALCIUM 8.9 11/16/2013 0448   PROT 8.2 02/12/2014 1313   PROT  8.2 05/18/2013 1205   ALBUMIN 3.4* 02/12/2014 1313   ALBUMIN 2.8* 05/18/2013 1205   AST 21 02/12/2014 1313   AST 18 05/18/2013 1205   ALT 15 02/12/2014 1313   ALT 16 05/18/2013 1205   ALKPHOS 105 02/12/2014 1313   ALKPHOS 111 05/18/2013 1205   BILITOT 0.31 02/12/2014 1313   BILITOT 0.2* 05/18/2013 1205   GFRNONAA 82* 11/16/2013 0448   GFRAA >90 11/16/2013 0448     Impression:  The patient is tolerating radiotherapy.   Plan:  Continue radiotherapy as planned.   -----------------------------------  [name], MD

## 2014-03-04 NOTE — Progress Notes (Signed)
Weekly rad txs left chest wall, hyperpigmentaion, no skin breakdown , incision has healing dry wound,no drainage "Dry as a bone" states patient, using radiaplex bid, good appetite no c/o 2:49 PM

## 2014-03-05 ENCOUNTER — Ambulatory Visit
Admission: RE | Admit: 2014-03-05 | Discharge: 2014-03-05 | Disposition: A | Discharge status: Home / Self Care | Payer: PRIVATE HEALTH INSURANCE | Source: Ambulatory Visit | Attending: Radiation Oncology | Admitting: Radiation Oncology

## 2014-03-05 ENCOUNTER — Ambulatory Visit: Payer: PRIVATE HEALTH INSURANCE

## 2014-03-06 ENCOUNTER — Ambulatory Visit
Admission: RE | Admit: 2014-03-06 | Discharge: 2014-03-06 | Disposition: A | Discharge status: Home / Self Care | Payer: PRIVATE HEALTH INSURANCE | Source: Ambulatory Visit | Attending: Radiation Oncology | Admitting: Radiation Oncology

## 2014-03-06 ENCOUNTER — Ambulatory Visit: Payer: PRIVATE HEALTH INSURANCE

## 2014-03-07 ENCOUNTER — Ambulatory Visit: Payer: PRIVATE HEALTH INSURANCE

## 2014-03-07 ENCOUNTER — Ambulatory Visit
Admission: RE | Admit: 2014-03-07 | Discharge: 2014-03-07 | Disposition: A | Discharge status: Home / Self Care | Payer: PRIVATE HEALTH INSURANCE | Source: Ambulatory Visit | Attending: Radiation Oncology | Admitting: Radiation Oncology

## 2014-03-08 ENCOUNTER — Ambulatory Visit: Payer: PRIVATE HEALTH INSURANCE

## 2014-03-08 ENCOUNTER — Ambulatory Visit (HOSPITAL_BASED_OUTPATIENT_CLINIC_OR_DEPARTMENT_OTHER): Payer: PRIVATE HEALTH INSURANCE

## 2014-03-08 ENCOUNTER — Ambulatory Visit
Admission: RE | Admit: 2014-03-08 | Discharge: 2014-03-08 | Disposition: A | Discharge status: Home / Self Care | Payer: PRIVATE HEALTH INSURANCE | Source: Ambulatory Visit | Attending: Radiation Oncology | Admitting: Radiation Oncology

## 2014-03-08 VITALS — BP 99/70 | HR 79 | Temp 98.3°F

## 2014-03-08 DIAGNOSIS — C50219 Malignant neoplasm of upper-inner quadrant of unspecified female breast: Secondary | ICD-10-CM

## 2014-03-08 DIAGNOSIS — Z5111 Encounter for antineoplastic chemotherapy: Secondary | ICD-10-CM

## 2014-03-08 DIAGNOSIS — Z95828 Presence of other vascular implants and grafts: Secondary | ICD-10-CM

## 2014-03-08 MED ORDER — GOSERELIN ACETATE 3.6 MG ~~LOC~~ IMPL
3.6000 mg | DRUG_IMPLANT | Freq: Once | SUBCUTANEOUS | Status: AC
  Administered 2014-03-08: 3.6 mg via SUBCUTANEOUS
  Filled 2014-03-08: qty 3.6

## 2014-03-08 MED ORDER — HEPARIN SOD (PORK) LOCK FLUSH 100 UNIT/ML IV SOLN
500.0000 [IU] | Freq: Once | INTRAVENOUS | Status: AC
  Administered 2014-03-08: 500 [IU] via INTRAVENOUS
  Filled 2014-03-08: qty 5

## 2014-03-08 MED ORDER — SODIUM CHLORIDE 0.9 % IJ SOLN
10.0000 mL | INTRAMUSCULAR | Status: DC | PRN
  Administered 2014-03-08: 10 mL via INTRAVENOUS
  Filled 2014-03-08: qty 10

## 2014-03-11 ENCOUNTER — Encounter: Payer: Self-pay | Admitting: Radiation Oncology

## 2014-03-11 ENCOUNTER — Ambulatory Visit: Payer: PRIVATE HEALTH INSURANCE

## 2014-03-11 ENCOUNTER — Encounter: Payer: PRIVATE HEALTH INSURANCE | Admitting: Radiation Oncology

## 2014-03-11 ENCOUNTER — Ambulatory Visit
Admission: RE | Admit: 2014-03-11 | Discharge: 2014-03-11 | Disposition: A | Discharge status: Home / Self Care | Payer: PRIVATE HEALTH INSURANCE | Source: Ambulatory Visit | Attending: Radiation Oncology | Admitting: Radiation Oncology

## 2014-03-11 VITALS — BP 108/77 | HR 92 | Temp 98.3°F | Ht 65.5 in | Wt 213.7 lb

## 2014-03-11 DIAGNOSIS — C50219 Malignant neoplasm of upper-inner quadrant of unspecified female breast: Secondary | ICD-10-CM

## 2014-03-11 NOTE — Progress Notes (Signed)
   Weekly Management Note:  outpatient Current Dose:  50 Gy  Projected Dose: 60 Gy   Narrative:  The patient presents for routine under treatment assessment.  CBCT/MVCT images/Port film x-rays were reviewed.  The chart was checked. More skin irritation  Physical Findings:  height is 5' 5.5" (1.664 m) and weight is 213 lb 11.2 oz (96.934 kg). Her temperature is 98.3 F (36.8 C). Her blood pressure is 108/77 and her pulse is 92.  dry desquamation along lateral left chest wall.  Wound stable.  Diffuse hyperpigmentation over chest wall.   CBC    Component Value Date/Time   WBC 4.1 02/26/2014 1322   WBC 7.6 11/17/2013 0540   RBC 4.34 02/26/2014 1322   RBC 2.83* 11/17/2013 0540   HGB 11.8 02/26/2014 1322   HGB 8.9* 11/17/2013 0540   HCT 36.4 02/26/2014 1322   HCT 26.8* 11/17/2013 0540   PLT 257 02/26/2014 1322   PLT 232 11/17/2013 0540   MCV 83.9 02/26/2014 1322   MCV 94.7 11/17/2013 0540   MCH 27.2 02/26/2014 1322   MCH 31.4 11/17/2013 0540   MCHC 32.4 02/26/2014 1322   MCHC 33.2 11/17/2013 0540   RDW 16.1* 02/26/2014 1322   RDW 15.9* 11/17/2013 0540   LYMPHSABS 0.8* 02/26/2014 1322   LYMPHSABS 2.8 11/06/2013 1030   MONOABS 0.3 02/26/2014 1322   MONOABS 0.5 11/06/2013 1030   EOSABS 0.2 02/26/2014 1322   EOSABS 0.2 11/06/2013 1030   BASOSABS 0.0 02/26/2014 1322   BASOSABS 0.0 11/06/2013 1030   CMP     Component Value Date/Time   NA 140 02/12/2014 1313   NA 138 11/16/2013 0448   K 3.2* 02/12/2014 1313   K 3.5 11/16/2013 0448   CL 103 11/16/2013 0448   CL 102 06/01/2013 1500   CO2 28 02/12/2014 1313   CO2 27 11/16/2013 0448   GLUCOSE 112 02/12/2014 1313   GLUCOSE 116* 11/16/2013 0448   GLUCOSE 95 06/01/2013 1500   BUN 6.7* 02/12/2014 1313   BUN 8 11/16/2013 0448   CREATININE 0.9 02/12/2014 1313   CREATININE 0.86 11/16/2013 0448   CALCIUM 10.0 02/12/2014 1313   CALCIUM 8.9 11/16/2013 0448   PROT 8.2 02/12/2014 1313   PROT 8.2 05/18/2013 1205   ALBUMIN 3.4* 02/12/2014 1313   ALBUMIN 2.8* 05/18/2013 1205   AST 21  02/12/2014 1313   AST 18 05/18/2013 1205   ALT 15 02/12/2014 1313   ALT 16 05/18/2013 1205   ALKPHOS 105 02/12/2014 1313   ALKPHOS 111 05/18/2013 1205   BILITOT 0.31 02/12/2014 1313   BILITOT 0.2* 05/18/2013 1205   GFRNONAA 82* 11/16/2013 0448   GFRAA >90 11/16/2013 0448     Impression:  The patient is tolerating radiotherapy.  Plan:  Continue radiotherapy as planned.  Change cream to Biafine and use neosporin in all peeling areas of skin.  ________________________________   [name], M.D.

## 2014-03-11 NOTE — Progress Notes (Signed)
Erika [name] has received 25 fractions to her left chest wall.  Note marked hyperpigmentation with dry desquamation for which she is applying neosporin.  The area is clean and dry.  She c/o stinging  And burning in her left chest wall region. She has a [employer]ch[employer], retail, Oxycodone and Gabapentin for relief.  She admits to fatigue and reports that she takes frequent naps.

## 2014-03-12 ENCOUNTER — Ambulatory Visit
Admission: RE | Admit: 2014-03-12 | Discharge: 2014-03-12 | Disposition: A | Discharge status: Home / Self Care | Payer: PRIVATE HEALTH INSURANCE | Source: Ambulatory Visit | Attending: Radiation Oncology | Admitting: Radiation Oncology

## 2014-03-12 ENCOUNTER — Ambulatory Visit: Payer: PRIVATE HEALTH INSURANCE

## 2014-03-12 NOTE — Progress Notes (Signed)
Hematology and Oncology Follow Up Visit  Erika [name] [id number] 1971-04-08 43 y.o. 03/14/2014 10:58 PM     Principle Diagnosis:Erika [name] 43 y.o. female with stage IIIB breast cancer.     Prior Therapy:  1. left sided breast mass show up about 6 months prior to diagnosis. 2 months prior this began getting larger rapidly. She was seen in an urgent care where this mass was thought to be an abscess. Apparently there was an attempt to excise or drain this that was not successful and she was referred to Dr. [name] for evaluation. She underwent punch biopsy on 04/23/13 of her skin and the mass. She was then also sent for mm/us. The biopsy shows invasive high grade carcinoma that appears to be breast. It is ER 14%, PR negative, HER-2/neu negative with a Ki-67 that approaches 100%. She has also undergone mm with at least 10 cm upper inner left breast tumor with mild overyling skin thickening. There is also a 3x10 mm cluster of calcs near there also. There is indeterminate 7 mm level 1 left axillary node, she underwent a lymph node biopsy that was positive for disease. No mm evidence of right breast malignancy. An MRI of the breasts was done on 05/22/13 and showed a 14 cm mass in the left breast protruding from the skin and also a 2.5 cm level II axillary node.   2. S/P neoadjuvant chemotherapy. Adriamycin and Cytoxan began on 05/25/13. She completed 4 cycles with Neulasta support. Taxol carbo began on 8/8. This was discontinued due to neuropathy, she started on Gemzar/Carbo on 8/22- 11/2013.   #3 staging PET/CT performed on 06/13/2013 revealed no evidence of distant disease.   #4 anemia of iron deficiency. She has received parenteral iron in the past. This was associated with menorrhagia, the patient has since been receiving Zoladex with great improvement of her hemoglobin and iron studies.   #5 Right upper extremity DVT: Patient is on Lovenox since July 2014 completed all anti-coagulation in 12/14.    #6 status post left mastectomy with axillary lymph node dissection. Final pathology did reveal 6.5 cm residual disease 0 of 12 lymph nodes positive for metastatic disease. Tumor was estrogen receptor +14% progesterone receptor negative HER-2/neu negative  #7 Patient began adjuvant radiation therapy on 02/04/14, Xeloda was added 2-3 weeks into treatment  Current therapy: Adjuvant radiation with concurrent Xeloda  Interim History: Erika [name] 43 y.o. female with stage IIIB breast cancer currently undergoing adjuvant radiation with concurrent Xeloda.  She is doing moderately well today.  She is having some peeling and pain to her left chest wall.  It does feel like a sunburn and she is wearing loose clothing and staying at home as much as possible.  She is otherwise doing well.  She denies fevers, chills, nausea, vomiting, constipation, diarrhea, numbness, dryness or cracking on her hands and feet, mucositis, or any other concerns.    Medications:  Current Outpatient Prescriptions  Medication Sig Dispense Refill  . capecitabine (XELODA) 500 MG tablet Take 2 tabs every 12 hours with radiation therapy  120 tablet  3  . fentaNYL (DURAGESIC - DOSED MCG/HR) 25 MCG/HR patch Place 1 patch (25 mcg total) onto the skin every 3 (three) days. Use with 75 mcg patch to equal 100 mcg.  10 patch  0  . fentaNYL (DURAGESIC - DOSED MCG/HR) 75 MCG/HR Place 1 patch (75 mcg total) onto the skin every 3 (three) days.  10 patch  0  . gabapentin (NEURONTIN) 300 MG capsule  Take 1 capsule (300 mg total) by mouth 3 (three) times daily.  90 capsule  3  . ibuprofen (ADVIL,MOTRIN) 200 MG tablet Take 600-800 mg by mouth every 4 (four) hours as needed for mild pain or moderate pain.       Marland Kitchen lidocaine-prilocaine (EMLA) cream Apply 1 application topically every 14 (fourteen) days. Every other Friday prior to chemo      . LORazepam (ATIVAN) 0.5 MG tablet Take 0.5 mg by mouth every 6 (six) hours as needed for anxiety.      .  non-metallic deodorant Jethro Poling) MISC Apply 1 application topically daily as needed.      Marland Kitchen oxyCODONE (OXY IR/ROXICODONE) 5 MG immediate release tablet Take 1 tablet (5 mg total) by mouth every 12 (twelve) hours as needed for moderate pain or severe pain.  60 tablet  0  . potassium chloride SA (K-DUR,KLOR-CON) 20 MEQ tablet Take 1 tablet (20 mEq total) by mouth 2 (two) times daily. Take 2 per day for 3 days and then 1 per day until next visit  33 tablet  1  . PRESCRIPTION MEDICATION PT REPORTS RECEIVING A SHOT ONCE A MONTH AT [facility]ER CENTER TO STOP HER MENSTRUAL PERIODS - I SEE DOCUMENTATION IN OFFICE NOTES FROM CANCER CENTER THAT PT WAS GETTING ZOLADEX SQ EVERY 28 DAYS.      Marland Kitchen traMADol (ULTRAM) 50 MG tablet Take 1-2 tablets (50-100 mg total) by mouth every 6 (six) hours as needed.  30 tablet  1  . UNABLE TO FIND Rx: L8000- Post Surgical Bra (Quantity: 6) Q3009- Non Silicone Breast prosthesis (Quantity: 1) Dx: 174.9; Left mastectomy  1 each  0  . zolpidem (AMBIEN) 5 MG tablet Take 5 mg by mouth at bedtime as needed for sleep.      . cyclobenzaprine (FLEXERIL) 5 MG tablet TAKE 1 TABLET BY MOUTH THREE TIMES A DAY FOR MUSCLE SPASMS  30 tablet  1  . hyaluronate sodium (RADIAPLEXRX) GEL Apply 1 application topically 2 (two) times daily. Apply to breast after rad tx and bedtime daily      . ondansetron (ZOFRAN) 8 MG tablet Take 1 tablet (8 mg total) by mouth every 8 (eight) hours as needed for nausea or vomiting.  30 tablet  0   No current facility-administered medications for this visit.   Facility-Administered Medications Ordered in Other Visits  Medication Dose Route Frequency Provider Last Rate Last Dose  . topical emolient (BIAFINE) emulsion   Topical Daily [name], MD         Allergies:  Allergies  Allergen Reactions  . Doxycycline Nausea And Vomiting    Medical History: Past Medical History  Diagnosis Date  . Breast cancer     Invasive High Grade Carcinoma  . Headache(784.0)      Hx: of Migraines  . Status post chemotherapy     Adriamycin and Cytoxan  . Anxiety     ANXIOUS ABOUT HAVEING BIG SURGERY - MASTECTOMY  . 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 DVT (deep venous thrombosis)     RT ARM-PT ON LOVENOX  . Hx of iron deficiency anemia 11/26/13    Surgical History:  Past Surgical History  Procedure Laterality Date  . Tubal ligation    . Portacath placement Right 05/22/2013    Procedure: INSERTION PORT-A-CATH;  Surgeon: [name], MD;  Location: [facility];  Service: General;  Laterality: Right;  . Left breast needle core biopsy Left 05/21/13  . Left breast needle  core  biopsy Left 06/22/13  . Mastectomy modified radical Left 11/15/2013    Procedure: LEFT MASTECTOMY MODIFIED RADICAL;  Surgeon: [name], MD;  Location: [facility];  Service: General;  Laterality: Left;     Review of Systems: A 10 point review of systems was conducted and is otherwise negative except for what is noted above.     Physical Exam: Blood pressure 122/82, pulse 70, temperature 97.8 F (36.6 C), temperature source Oral, resp. rate 18, height 5' 5.5" (1.664 m), weight 213 lb 4.8 oz (96.752 kg), last menstrual period 05/30/2013. GENERAL: Patient is a well appearing female in no acute distress HEENT:  Sclerae anicteric.  Oropharynx clear and moist. No ulcerations or evidence of oropharyngeal candidiasis. Neck is supple.  NODES:  No cervical, supraclavicular, or axillary lymphadenopathy palpated.  BREAST EXAM:  Left chest wall s/p mastectomy, desquamation present, no drainage, swelling, or sign of infection.  LUNGS:  Clear to auscultation bilaterally.  No wheezes or rhonchi. HEART:  Regular rate and rhythm. No murmur appreciated. ABDOMEN:  Soft, nontender.  Positive, normoactive bowel sounds. No organomegaly palpated. MSK:  No focal spinal tenderness to palpation. Full range of motion bilaterally in the upper extremities. EXTREMITIES:  No peripheral edema.   SKIN:  Clear with  no obvious rashes or skin changes. No nail dyscrasia. NEURO:  Nonfocal. Well oriented.  Appropriate affect. ECOG PERFORMANCE STATUS: 1 - Symptomatic but completely ambulatory   Lab Results: Lab Results  Component Value Date   WBC 9.1 03/14/2014   HGB 11.0* 03/14/2014   HCT 33.5* 03/14/2014   MCV 84.1 03/14/2014   PLT 269 03/14/2014     Chemistry      Component Value Date/Time   NA 142 03/14/2014 1333   NA 138 11/16/2013 0448   K 3.5 03/14/2014 1333   K 3.5 11/16/2013 0448   CL 103 11/16/2013 0448   CL 102 06/01/2013 1500   CO2 26 03/14/2014 1333   CO2 27 11/16/2013 0448   BUN 13.6 03/14/2014 1333   BUN 8 11/16/2013 0448   CREATININE 0.9 03/14/2014 1333   CREATININE 0.86 11/16/2013 0448      Component Value Date/Time   CALCIUM 9.7 03/14/2014 1333   CALCIUM 8.9 11/16/2013 0448   ALKPHOS 91 03/14/2014 1333   ALKPHOS 111 05/18/2013 1205   AST 13 03/14/2014 1333   AST 18 05/18/2013 1205   ALT 7 03/14/2014 1333   ALT 16 05/18/2013 1205   BILITOT 0.20 03/14/2014 1333   BILITOT 0.2* 05/18/2013 1205       Assessment and Plan: Erika [name] 43 y.o. female with  1.  Stage IIIB breast cancer:  Patient is s/p neoadjuvant chemotherapy, mastectomy, and is now undergoing radiation therapy with concurrent Xeloda.  She is tolerating it relatively well.  She does have desquamation.  I instructed her to stop taking the Xeloda once she is finished with radiation.  2.  Pain control:  The patient does have severe pain at her left mastectomy site and generalized pain.  She is taking Fentanyl patches 133mg every 72 hours and Oxycodone 570mtwice a day.  I refilled both of these for her and I counseled her on using them and that in a couple of months we would look at ways to try to get her off of these.  3.  Left arm lymphedema.  She previously went to PT.  She plans on resuming this when she is finished with radiation therapy.    Ms. MoUmholtzill return  tomorrow for labs, and in one month for labs and evaluation.  She knows that if she  has any questions or concerns in the interim she knows to contact us, we can certainly see her sooner if needed.    I spent 25 minutes counseling the patient face to face.  The total time spent in the appointment was 30 minutes.  Minette Headland, [place] [phone] 03/14/2014 10:58 PM

## 2014-03-13 ENCOUNTER — Ambulatory Visit: Payer: PRIVATE HEALTH INSURANCE

## 2014-03-13 ENCOUNTER — Telehealth: Payer: Self-pay | Admitting: Adult Health

## 2014-03-13 ENCOUNTER — Ambulatory Visit
Admission: RE | Admit: 2014-03-13 | Discharge: 2014-03-13 | Disposition: A | Discharge status: Home / Self Care | Payer: PRIVATE HEALTH INSURANCE | Source: Ambulatory Visit | Attending: Radiation Oncology | Admitting: Radiation Oncology

## 2014-03-13 ENCOUNTER — Ambulatory Visit (HOSPITAL_BASED_OUTPATIENT_CLINIC_OR_DEPARTMENT_OTHER): Payer: PRIVATE HEALTH INSURANCE | Admitting: Adult Health

## 2014-03-13 VITALS — BP 122/82 | HR 70 | Temp 97.8°F | Resp 18 | Ht 65.5 in | Wt 213.3 lb

## 2014-03-13 DIAGNOSIS — C50219 Malignant neoplasm of upper-inner quadrant of unspecified female breast: Secondary | ICD-10-CM

## 2014-03-13 DIAGNOSIS — Z9010 Acquired absence of unspecified breast and nipple: Secondary | ICD-10-CM

## 2014-03-13 DIAGNOSIS — G8918 Other acute postprocedural pain: Secondary | ICD-10-CM

## 2014-03-13 DIAGNOSIS — G629 Polyneuropathy, unspecified: Secondary | ICD-10-CM

## 2014-03-13 DIAGNOSIS — I890 Lymphedema, not elsewhere classified: Secondary | ICD-10-CM

## 2014-03-13 MED ORDER — OXYCODONE HCL 5 MG PO TABS: 5.0000 mg | ORAL_TABLET | Freq: Two times a day (BID) | ORAL | Status: DC | PRN

## 2014-03-13 MED ORDER — FENTANYL 75 MCG/HR TD PT72: 75.0000 ug | MEDICATED_PATCH | TRANSDERMAL | Status: DC

## 2014-03-13 MED ORDER — FENTANYL 25 MCG/HR TD PT72: 25.0000 ug | MEDICATED_PATCH | TRANSDERMAL | Status: DC

## 2014-03-13 NOTE — Patient Instructions (Signed)
You are doing well.  We need to check your labs today.  Stop taking the Xeloda when you finish your radiation treatment.  Please call us if you have any questions or concerns.    We will see you back in 4 weeks.

## 2014-03-13 NOTE — Telephone Encounter (Signed)
, °

## 2014-03-14 ENCOUNTER — Other Ambulatory Visit (HOSPITAL_BASED_OUTPATIENT_CLINIC_OR_DEPARTMENT_OTHER): Payer: PRIVATE HEALTH INSURANCE

## 2014-03-14 ENCOUNTER — Ambulatory Visit
Admission: RE | Admit: 2014-03-14 | Discharge: 2014-03-14 | Disposition: A | Discharge status: Home / Self Care | Payer: PRIVATE HEALTH INSURANCE | Source: Ambulatory Visit | Attending: Radiation Oncology | Admitting: Radiation Oncology

## 2014-03-14 ENCOUNTER — Encounter: Payer: Self-pay | Admitting: Adult Health

## 2014-03-14 DIAGNOSIS — C50219 Malignant neoplasm of upper-inner quadrant of unspecified female breast: Secondary | ICD-10-CM

## 2014-03-14 DIAGNOSIS — D649 Anemia, unspecified: Secondary | ICD-10-CM

## 2014-03-14 LAB — COMPREHENSIVE METABOLIC PANEL (CC13)
ALK PHOS: 91 U/L (ref 40–150)
ALT: 7 U/L (ref 0–55)
AST: 13 U/L (ref 5–34)
Albumin: 3.2 g/dL — ABNORMAL LOW (ref 3.5–5.0)
Anion Gap: 9 mEq/L (ref 3–11)
BUN: 13.6 mg/dL (ref 7.0–26.0)
CO2: 26 mEq/L (ref 22–29)
CREATININE: 0.9 mg/dL (ref 0.6–1.1)
Calcium: 9.7 mg/dL (ref 8.4–10.4)
Chloride: 108 mEq/L (ref 98–109)
Glucose: 86 mg/dl (ref 70–140)
Potassium: 3.5 mEq/L (ref 3.5–5.1)
Sodium: 142 mEq/L (ref 136–145)
Total Bilirubin: 0.2 mg/dL (ref 0.20–1.20)
Total Protein: 8 g/dL (ref 6.4–8.3)

## 2014-03-14 LAB — CBC WITH DIFFERENTIAL/PLATELET
BASO%: 0.3 % (ref 0.0–2.0)
BASOS ABS: 0 10*3/uL (ref 0.0–0.1)
EOS%: 0.3 % (ref 0.0–7.0)
Eosinophils Absolute: 0 10*3/uL (ref 0.0–0.5)
HEMATOCRIT: 33.5 % — AB (ref 34.8–46.6)
HEMOGLOBIN: 11 g/dL — AB (ref 11.6–15.9)
LYMPH%: 10.5 % — ABNORMAL LOW (ref 14.0–49.7)
MCH: 27.5 pg (ref 25.1–34.0)
MCHC: 32.7 g/dL (ref 31.5–36.0)
MCV: 84.1 fL (ref 79.5–101.0)
MONO#: 0.6 10*3/uL (ref 0.1–0.9)
MONO%: 6.9 % (ref 0.0–14.0)
NEUT#: 7.5 10*3/uL — ABNORMAL HIGH (ref 1.5–6.5)
NEUT%: 82 % — AB (ref 38.4–76.8)
Platelets: 269 10*3/uL (ref 145–400)
RBC: 3.99 10*6/uL (ref 3.70–5.45)
RDW: 18.6 % — ABNORMAL HIGH (ref 11.2–14.5)
WBC: 9.1 10*3/uL (ref 3.9–10.3)
lymph#: 1 10*3/uL (ref 0.9–3.3)

## 2014-03-15 ENCOUNTER — Other Ambulatory Visit: Payer: PRIVATE HEALTH INSURANCE

## 2014-03-15 ENCOUNTER — Ambulatory Visit
Admission: RE | Admit: 2014-03-15 | Discharge: 2014-03-15 | Disposition: A | Discharge status: Home / Self Care | Payer: PRIVATE HEALTH INSURANCE | Source: Ambulatory Visit | Attending: Radiation Oncology | Admitting: Radiation Oncology

## 2014-03-15 ENCOUNTER — Ambulatory Visit: Payer: PRIVATE HEALTH INSURANCE

## 2014-03-18 ENCOUNTER — Ambulatory Visit: Payer: PRIVATE HEALTH INSURANCE

## 2014-03-18 ENCOUNTER — Encounter: Payer: Self-pay | Admitting: Radiation Oncology

## 2014-03-18 ENCOUNTER — Ambulatory Visit
Admission: RE | Admit: 2014-03-18 | Discharge: 2014-03-18 | Disposition: A | Discharge status: Home / Self Care | Payer: PRIVATE HEALTH INSURANCE | Source: Ambulatory Visit | Attending: Radiation Oncology | Admitting: Radiation Oncology

## 2014-03-18 VITALS — BP 94/68 | HR 90 | Temp 98.5°F | Resp 20 | Wt 212.0 lb

## 2014-03-18 DIAGNOSIS — C50219 Malignant neoplasm of upper-inner quadrant of unspecified female breast: Secondary | ICD-10-CM

## 2014-03-18 MED ORDER — SILVER SULFADIAZINE 1 % EX CREA
TOPICAL_CREAM | Freq: Two times a day (BID) | CUTANEOUS | Status: DC
  Administered 2014-03-18: 15:00:00 via TOPICAL

## 2014-03-18 NOTE — Progress Notes (Signed)
Weekly Management Note:  Site: Left chest wall boost Current Dose:  1000  cGy Projected Dose: 1000  cGy  Narrative: The patient is seen today for routine under treatment assessment. CBCT/MVCT images/port films were reviewed. The chart was reviewed.   Treatment is completed today. She does report left chest wall discomfort as expected secondary to her moist desquamation. She has been using Neosporin ointment.  Physical Examination:  Filed Vitals:   03/18/14 1459  BP: 94/68  Pulse: 90  Temp: 98.5 F (36.9 C)  Resp: 20  .  Weight: 212 lb (96.163 kg). There is extensive hyperpigmentation the skin along the left chest wall and nodal regions. There is significant desquamation along the mid to lateral left chest wall with hypo-pigmentation of the skin.  Impression: Tolerating radiation therapy well with extensive moist desquamation as expected.  Plan: Radiation therapy completed. We will go ahead and start Silvadene cream and stop Neosporin to minimize risk for contact dermatitis. She may use hydrogel pads as well. Follow visit with Dr. Isidore Moos in one week.

## 2014-03-18 NOTE — Progress Notes (Addendum)
Pt c/o pain w/skin in left chest wall treatment area. Pt has dry desquamation over chest wall and moist desquamation in left axilla area. She has been applying Biafine to her dry area and Neosporin to the moist areas. She states "it is peeling more than last week" indicating her axilla. Pt c/o "bone pain all over" and pain of left chest wall w/skin irritation. She is wearing Duragesic patch 100 mcg, takes Oxy-IR 5 mg every 12 hours. She states she is out of Tramadol. Advised she may try Advil prn. Pt denies fatigue and loss of appetite.  She completed treatment today, will discuss possible FU w/Dr Isidore Moos in a week for skin check. Per Dr Valere Dross, pt will return in one week to see Dr Isidore Moos. Gave pt Silvadene to apply to moist areas bid w/instructions for proper use. Pt verbalized understanding.

## 2014-03-19 ENCOUNTER — Ambulatory Visit: Payer: PRIVATE HEALTH INSURANCE

## 2014-03-19 NOTE — Progress Notes (Addendum)
[facility] End of Treatment Note  Name:Erika [name]  Date: 03/18/2014 YTK:[id number] DOB:07-23-1971    DIAGNOSIS: Clinical T4bN1M0, ypT4b, ypN0 left breast INVASIVE DUCTAL CARCINOMA, triple negative, grade III   INDICATION FOR TREATMENT: Curative   TREATMENT DATES: 02-04-14 to 03-18-14                        Site/dose:    1) Left Chest Wall / 50 Gy in 25 fractions 2) Left Supraclavicular fossa/ 46 Gy in 23 fractions 3) Left Posterior Axillary boost / 8.533 Gy in 23 fractions 4) Left Chest Wall Scar boost / 10 Gy in 5 fractions  Beams/energy:  1) Opposed tangents /  6 MV photons 2) Right anterior oblique / 10 MV photons 3) PA / 6MV photons 4) En face electrons / 6 MeV electrons                    NARRATIVE:   She tolerated RT with XELODA reasonably well, developing extensive hyperpigmentation the skin along the left chest wall and nodal regions. There was significant moist desquamation along the mid to lateral left chest wall with hypo-pigmentation of the skin.    Small dehiscence of chest wall scar had appeared before start of RT, but therapy proceeded due to aggressiveness of her cancer. The wound dried up and remained relatively stable in size over treatment.                PLAN: Routine followup in one week. Patient instructed to call if questions or worsening complaints in interim.  -----------------------------------  [name], MD

## 2014-03-21 ENCOUNTER — Encounter: Payer: Self-pay | Admitting: Radiation Oncology

## 2014-03-27 ENCOUNTER — Encounter: Payer: Self-pay | Admitting: Radiation Oncology

## 2014-03-27 ENCOUNTER — Ambulatory Visit
Admission: RE | Admit: 2014-03-27 | Discharge: 2014-03-27 | Disposition: A | Discharge status: Home / Self Care | Payer: PRIVATE HEALTH INSURANCE | Source: Ambulatory Visit | Attending: Radiation Oncology | Admitting: Radiation Oncology

## 2014-03-27 VITALS — BP 107/77 | HR 84 | Temp 98.9°F | Ht 65.5 in | Wt 217.1 lb

## 2014-03-27 DIAGNOSIS — C50219 Malignant neoplasm of upper-inner quadrant of unspecified female breast: Secondary | ICD-10-CM

## 2014-03-27 DIAGNOSIS — C50419 Malignant neoplasm of upper-outer quadrant of unspecified female breast: Secondary | ICD-10-CM | POA: Insufficient documentation

## 2014-03-27 HISTORY — DX: Personal history of irradiation: Z92.3

## 2014-03-27 MED ORDER — BIAFINE EX EMUL
CUTANEOUS | Status: DC | PRN
  Administered 2014-03-27: 17:00:00 via TOPICAL

## 2014-03-27 NOTE — Progress Notes (Signed)
Erika [name] returns today s/o radiation therapy to her Left Chest Wall which completed on 03/18/14.  She c/o pain the int prior tx.field as shooting, stabbing, and burning pain and currently grades this a a level 12.  Discovered that her 75 mg Fentanyl patch has fallen off at this time.  Has Oxycodone 5 mg tabs scheduled as Q12 hours prn.  Her left chest wall has crusting in the medial area, and the moist desquamation has resolved.

## 2014-03-27 NOTE — Progress Notes (Signed)
[facility]iation [facility]         [phone] ________________________________  Name: Erika [name] MRN: [id number]  Date: 03/27/2014  DOB: Aug 19, 1971  Follow-Up Visit Note  outpatient  CC: [name], MD  [name], MD  Diagnosis and Prior Radiotherapy:   ypT4b, ypN0 left breast INVASIVE DUCTAL CARCINOMA, triple negative, grade III   TREATMENT DATES: 02-04-14 to 03-18-14  Site/dose:  1) Left Chest Wall / 50 Gy in 25 fractions  2) Left Supraclavicular fossa/ 46 Gy in 23 fractions  3) Left Posterior Axillary boost / 8.533 Gy in 23 fractions  4) Left Chest Wall Scar boost / 10 Gy in 5 fractions   Narrative:  The patient returns today for routine follow-up.  Doing well, but in pain and realized her fentanyl patch fell off.  Her skin has become dry and scaly over the left chest wall.  Wound along incision has dried up, no oozing.                              ALLERGIES:  is allergic to doxycycline.  Meds: Current Outpatient Prescriptions  Medication Sig Dispense Refill  . cyclobenzaprine (FLEXERIL) 5 MG tablet TAKE 1 TABLET BY MOUTH THREE TIMES A DAY FOR MUSCLE SPASMS  30 tablet  1  . fentaNYL (DURAGESIC - DOSED MCG/HR) 25 MCG/HR patch Place 1 patch (25 mcg total) onto the skin every 3 (three) days. Use with 75 mcg patch to equal 100 mcg.  10 patch  0  . fentaNYL (DURAGESIC - DOSED MCG/HR) 75 MCG/HR Place 1 patch (75 mcg total) onto the skin every 3 (three) days.  10 patch  0  . gabapentin (NEURONTIN) 300 MG capsule Take 1 capsule (300 mg total) by mouth 3 (three) times daily.  90 capsule  3  . ibuprofen (ADVIL,MOTRIN) 200 MG tablet Take 600-800 mg by mouth every 4 (four) hours as needed for mild pain or moderate pain.       Marland Kitchen lidocaine-prilocaine (EMLA) cream Apply 1 application topically every 14 (fourteen) days. Every other Friday prior to chemo      . LORazepam (ATIVAN) 0.5 MG tablet Take 0.5 mg by mouth every 6 (six) hours as needed for anxiety.      . non-metallic  deodorant Jethro Poling) MISC Apply 1 application topically daily as needed.      . ondansetron (ZOFRAN) 8 MG tablet Take 1 tablet (8 mg total) by mouth every 8 (eight) hours as needed for nausea or vomiting.  30 tablet  0  . oxyCODONE (OXY IR/ROXICODONE) 5 MG immediate release tablet Take 1 tablet (5 mg total) by mouth every 12 (twelve) hours as needed for moderate pain or severe pain.  60 tablet  0  . potassium chloride SA (K-DUR,KLOR-CON) 20 MEQ tablet Take 1 tablet (20 mEq total) by mouth 2 (two) times daily. Take 2 per day for 3 days and then 1 per day until next visit  33 tablet  1  . PRESCRIPTION MEDICATION PT REPORTS RECEIVING A SHOT ONCE A MONTH AT [facility]ER CENTER TO STOP HER MENSTRUAL PERIODS - I SEE DOCUMENTATION IN OFFICE NOTES FROM CANCER CENTER THAT PT WAS GETTING ZOLADEX SQ EVERY 28 DAYS.      Marland Kitchen silver sulfADIAZINE (SILVADENE) 1 % cream Apply 1 application topically 2 (two) times daily.      Marland Kitchen zolpidem (AMBIEN) 5 MG tablet Take 5 mg by mouth at bedtime as needed for sleep.      Marland Kitchen  capecitabine (XELODA) 500 MG tablet Take 2 tabs every 12 hours with radiation therapy  120 tablet  3  . traMADol (ULTRAM) 50 MG tablet Take 1-2 tablets (50-100 mg total) by mouth every 6 (six) hours as needed.  30 tablet  1  . UNABLE TO FIND Rx: L8000- Post Surgical Bra (Quantity: 6) X4128- Non Silicone Breast prosthesis (Quantity: 1) Dx: 174.9; Left mastectomy  1 each  0   Current Facility-Administered Medications  Medication Dose Route Frequency Provider Last Rate Last Dose  . topical emolient (BIAFINE) emulsion   Topical PRN [name], MD      . topical emolient (BIAFINE) emulsion   Topical PRN [name], MD       Facility-Administered Medications Ordered in Other Encounters  Medication Dose Route Frequency Provider Last Rate Last Dose  . topical emolient (BIAFINE) emulsion   Topical Daily [name], MD        Physical Findings: The patient is in no acute distress. Patient is alert and oriented.   height is 5' 5.5" (1.664 m) and weight is 217 lb 1.6 oz (98.476 kg). Her temperature is 98.9 F (37.2 C). Her blood pressure is 107/77 and her pulse is 84. Marland Kitchen   Her skin has become dry and scaly over the left chest wall.  Wound along incision has dried up, no oozing.       Dry skin over upper left neck/back. No moistness.                         Lab Findings: Lab Results  Component Value Date   WBC 9.1 03/14/2014   HGB 11.0* 03/14/2014   HCT 33.5* 03/14/2014   MCV 84.1 03/14/2014   PLT 269 03/14/2014    Radiographic Findings: No results found.  Impression/Plan:  Doing well. Healing appropriately. Stop silvadene and use biafine on skin.  We gave her two tubes. When she runs out of this, used Radiaplex, and if skin is no longer irritated after that, may start using VitE lotion. F/u in 48mo, sooner if needed.    _____________________________________   [name], MD

## 2014-04-05 ENCOUNTER — Ambulatory Visit (HOSPITAL_BASED_OUTPATIENT_CLINIC_OR_DEPARTMENT_OTHER): Payer: PRIVATE HEALTH INSURANCE

## 2014-04-05 VITALS — BP 120/76 | HR 88 | Temp 97.3°F

## 2014-04-05 DIAGNOSIS — Z5111 Encounter for antineoplastic chemotherapy: Secondary | ICD-10-CM

## 2014-04-05 DIAGNOSIS — Z95828 Presence of other vascular implants and grafts: Secondary | ICD-10-CM

## 2014-04-05 DIAGNOSIS — C50219 Malignant neoplasm of upper-inner quadrant of unspecified female breast: Secondary | ICD-10-CM

## 2014-04-05 MED ORDER — HEPARIN SOD (PORK) LOCK FLUSH 100 UNIT/ML IV SOLN
500.0000 [IU] | Freq: Once | INTRAVENOUS | Status: DC
  Filled 2014-04-05: qty 5

## 2014-04-05 MED ORDER — SODIUM CHLORIDE 0.9 % IJ SOLN
10.0000 mL | INTRAMUSCULAR | Status: DC | PRN
  Filled 2014-04-05: qty 10

## 2014-04-05 MED ORDER — GOSERELIN ACETATE 3.6 MG ~~LOC~~ IMPL
3.6000 mg | DRUG_IMPLANT | Freq: Once | SUBCUTANEOUS | Status: AC
  Administered 2014-04-05: 3.6 mg via SUBCUTANEOUS
  Filled 2014-04-05: qty 3.6

## 2014-04-10 ENCOUNTER — Telehealth: Payer: Self-pay | Admitting: Adult Health

## 2014-04-10 ENCOUNTER — Other Ambulatory Visit (HOSPITAL_BASED_OUTPATIENT_CLINIC_OR_DEPARTMENT_OTHER): Payer: PRIVATE HEALTH INSURANCE

## 2014-04-10 ENCOUNTER — Encounter: Payer: Self-pay | Admitting: Adult Health

## 2014-04-10 ENCOUNTER — Ambulatory Visit (HOSPITAL_BASED_OUTPATIENT_CLINIC_OR_DEPARTMENT_OTHER): Payer: PRIVATE HEALTH INSURANCE | Admitting: Adult Health

## 2014-04-10 VITALS — BP 113/81 | HR 84 | Temp 98.0°F | Resp 20 | Ht 65.5 in | Wt 216.9 lb

## 2014-04-10 DIAGNOSIS — D649 Anemia, unspecified: Secondary | ICD-10-CM

## 2014-04-10 DIAGNOSIS — E876 Hypokalemia: Secondary | ICD-10-CM

## 2014-04-10 DIAGNOSIS — Z9010 Acquired absence of unspecified breast and nipple: Secondary | ICD-10-CM

## 2014-04-10 DIAGNOSIS — C50219 Malignant neoplasm of upper-inner quadrant of unspecified female breast: Secondary | ICD-10-CM

## 2014-04-10 DIAGNOSIS — I890 Lymphedema, not elsewhere classified: Secondary | ICD-10-CM

## 2014-04-10 DIAGNOSIS — G629 Polyneuropathy, unspecified: Secondary | ICD-10-CM

## 2014-04-10 DIAGNOSIS — G8918 Other acute postprocedural pain: Secondary | ICD-10-CM

## 2014-04-10 LAB — CBC WITH DIFFERENTIAL/PLATELET
BASO%: 0.2 % (ref 0.0–2.0)
Basophils Absolute: 0 10*3/uL (ref 0.0–0.1)
EOS%: 3.8 % (ref 0.0–7.0)
Eosinophils Absolute: 0.2 10*3/uL (ref 0.0–0.5)
HCT: 35.2 % (ref 34.8–46.6)
HGB: 11.3 g/dL — ABNORMAL LOW (ref 11.6–15.9)
LYMPH%: 35.1 % (ref 14.0–49.7)
MCH: 27.7 pg (ref 25.1–34.0)
MCHC: 32.1 g/dL (ref 31.5–36.0)
MCV: 86.3 fL (ref 79.5–101.0)
MONO#: 0.3 10*3/uL (ref 0.1–0.9)
MONO%: 5.4 % (ref 0.0–14.0)
NEUT#: 3.2 10*3/uL (ref 1.5–6.5)
NEUT%: 55.5 % (ref 38.4–76.8)
Platelets: 258 10*3/uL (ref 145–400)
RBC: 4.08 10*6/uL (ref 3.70–5.45)
RDW: 17.6 % — AB (ref 11.2–14.5)
WBC: 5.8 10*3/uL (ref 3.9–10.3)
lymph#: 2 10*3/uL (ref 0.9–3.3)

## 2014-04-10 LAB — COMPREHENSIVE METABOLIC PANEL (CC13)
ALT: 11 U/L (ref 0–55)
AST: 17 U/L (ref 5–34)
Albumin: 3.4 g/dL — ABNORMAL LOW (ref 3.5–5.0)
Alkaline Phosphatase: 123 U/L (ref 40–150)
Anion Gap: 8 mEq/L (ref 3–11)
BILIRUBIN TOTAL: 0.36 mg/dL (ref 0.20–1.20)
BUN: 7.7 mg/dL (ref 7.0–26.0)
CO2: 29 meq/L (ref 22–29)
Calcium: 10.3 mg/dL (ref 8.4–10.4)
Chloride: 103 mEq/L (ref 98–109)
Creatinine: 0.9 mg/dL (ref 0.6–1.1)
Glucose: 105 mg/dl (ref 70–140)
POTASSIUM: 3.3 meq/L — AB (ref 3.5–5.1)
SODIUM: 140 meq/L (ref 136–145)
TOTAL PROTEIN: 8.3 g/dL (ref 6.4–8.3)

## 2014-04-10 MED ORDER — ONDANSETRON HCL 8 MG PO TABS: 8.0000 mg | ORAL_TABLET | Freq: Three times a day (TID) | ORAL | Status: DC | PRN

## 2014-04-10 MED ORDER — FENTANYL 75 MCG/HR TD PT72: 75.0000 ug | MEDICATED_PATCH | TRANSDERMAL | Status: DC

## 2014-04-10 MED ORDER — POTASSIUM CHLORIDE CRYS ER 20 MEQ PO TBCR: 20.0000 meq | EXTENDED_RELEASE_TABLET | Freq: Every day | ORAL | Status: DC

## 2014-04-10 MED ORDER — OXYCODONE HCL 5 MG PO TABS: 5.0000 mg | ORAL_TABLET | Freq: Two times a day (BID) | ORAL | Status: DC | PRN

## 2014-04-10 MED ORDER — FENTANYL 25 MCG/HR TD PT72: 25.0000 ug | MEDICATED_PATCH | TRANSDERMAL | Status: DC

## 2014-04-10 NOTE — Progress Notes (Signed)
[employer] representative Note Outpatient Diagnosis: Breast Cancer   The patient's CT images from her initial simulation were reviewed to plan her boost treatment to her left chest wall scar.  Measurements were made regarding the area and depth of her tissues. The boost to the lumpectomy cavity will be delivered with 6 MeV electrons prescribed to the 93% isodose line.  0.5cm bolus daily.  A special port plan was reviewed and approved. A custom electron cut-out will be used for her boost field.  10 Gy in 5 fractions prescribed.  -----------------------------------  [name], MD

## 2014-04-10 NOTE — Telephone Encounter (Signed)
m, °

## 2014-04-10 NOTE — Patient Instructions (Signed)
Potassium Content of Foods Potassium is a mineral found in many foods and drinks. It helps keep fluids and minerals balanced in your body and also affects how steadily your heart beats. The body needs potassium to control blood pressure and to keep the muscles and nervous system healthy. However, certain health conditions and medicine may require you to eat more or less potassium-rich foods and drinks. Your caregiver or dietitian will tell you how much potassium you should have each day. COMMON SERVING SIZES The list below tells you how big or small common portion sizes are:  1 oz.........4 stacked dice.  3 oz.........Deck of cards.  1 tsp........Tip of little finger.  1 tbsp......Thumb.  2 tbsp......Golf ball.   c...........Half of a fist.  1 c............A fist. FOODS AND DRINKS HIGH IN POTASSIUM More than 200 mg of potassium per serving. A serving size is  c (120 mL or noted gram weight) unless otherwise stated. While all the items on this list are high in potassium, some items are higher in potassium than others. Fruits  Apricots (sliced), 83 g.  Apricots (dried halves), 3 oz / 24 g.  Avocado (cubed),  c / 50 g.  Banana (sliced), 75 g.  Cantaloupe (cubed), 80 g.  Dates (pitted), 5 whole / 35 g.  Figs (dried), 4 whole / 32 g.  Guava, c / 55 g.  Honeydew, 1 wedge / 85 g.  Kiwi (sliced), 90 g.  Nectarine, 1 small / 129 g.  Orange, 1 medium / 131 g.  Orange juice.  Pomegranate seeds, 87 g.  Pomegranate juice.  Prunes (pitted), 3 whole / 30 g.  Prune juice, 3 oz / 90 mL.  Seedless raisins, 3 tbsp / 27 g. Vegetables  Artichoke,  of a medium / 64 g.  Asparagus (boiled), 90 g.  Baked beans,  c / 63 g.  Bamboo shoots,  c / 38 g.  Beets (cooked slices), 85 g.  Broccoli (boiled), 78 g.  Brussels sprout (boiled), 78 g.  Butternut squash (baked), 103 g.  Chickpea (cooked), 82 g.  Green peas (cooked), 80 g.  Hubbard squash (baked cubes),  c /  68 g.  Kidney beans (cooked), 5 tbsp / 55 g.  Lima beans (cooked),  c / 43 g.  Navy beans (cooked),  c / 61 g.  Potato (baked), 61 g.  Potato (boiled), 78 g.  Pumpkin (boiled), 123 g.  Refried beans,  c / 79 g.  Spinach (cooked),  c / 45 g.  Split peas (cooked),  c / 65 g.  Sun-dried tomatoes, 2 tbsp / 7 g.  Sweet potato (baked),  c / 50 g.  Tomato (chopped or sliced), 90 g.  Tomato juice.  Tomato paste, 4 tsp / 21 g.  Tomato sauce,  c / 61 g.  Vegetable juice.  White mushrooms (cooked), 78 g.  Yam (cooked or baked),  c / 34 g.  Zucchini squash (boiled), 90 g. Other Foods and Drinks  Almonds (whole),  c / 36 g.  Cashews (oil roasted),  c / 32 g.  Chocolate milk.  Chocolate pudding, 142 g.  Clams (steamed), 1.5 oz / 43 g.  Dark chocolate, 1.5 oz / 42 g.  Fish, 3 oz / 85 g.  King crab (steamed), 3 oz / 85 g.  Lobster (steamed), 4 oz / 113 g.  Milk (skim, 1%, 2%, whole), 1 c / 240 mL.  Milk chocolate, 2.3 oz / 66 g.  Milk shake.  Nonfat fruit   variety yogurt, 123 g.  Peanuts (oil roasted), 1 oz / 28 g.  Peanut butter, 2 tbsp / 32 g.  Pistachio nuts, 1 oz / 28 g.  Pumpkin seeds, 1 oz / 28 g.  Red meat (broiled, cooked, grilled), 3 oz / 85 g.  Scallops (steamed), 3 oz / 85 g.  Shredded wheat cereal (dry), 3 oblong biscuits / 75 g.  Spaghetti sauce,  c / 66 g.  Sunflower seeds (dry roasted), 1 oz / 28 g.  Veggie burger, 1 patty / 70 g. FOODS MODERATE IN POTASSIUM Between 150 mg and 200 mg per serving. A serving is  c (120 mL or noted gram weight) unless otherwise stated. Fruits  Grapefruit,  of the fruit / 123 g.  Grapefruit juice.  Pineapple juice.  Plums (sliced), 83 g.  Tangerine, 1 large / 120 g. Vegetables  Carrots (boiled), 78 g.  Carrots (sliced), 61 g.  Rhubarb (cooked with sugar), 120 g.  Rutabaga (cooked), 120 g.  Sweet corn (cooked), 75 g.  Yellow snap beans (cooked), 63 g. Other Foods and  Drinks   Bagel, 1 bagel / 98 g.  Chicken breast (roasted and chopped),  c / 70 g.  Chocolate ice cream / 66 g.  Pita bread, 1 large / 64 g.  Shrimp (steamed), 4 oz / 113 g.  Swiss cheese (diced), 70 g.  Vanilla ice cream, 66 g.  Vanilla pudding, 140 g. FOODS LOW IN POTASSIUM Less than 150 mg per serving. A serving size is  cup (120 mL or noted gram weight) unless otherwise stated. If you eat more than 1 serving of a food low in potassium, the food may be considered a food high in potassium. Fruits  Apple (slices), 55 g.  Apple juice.  Applesauce, 122 g.  Blackberries, 72 g.  Blueberries, 74 g.  Cranberries, 50 g.  Cranberry juice.  Fruit cocktail, 119 g.  Fruit punch.  Grapes, 46 g.  Grape juice.  Mandarin oranges (canned), 126 g.  Peach (slices), 77 g.  Pineapple (chunks), 83 g.  Raspberries, 62 g.  Red cherries (without pits), 78 g.  Strawberries (sliced), 83 g.  Watermelon (diced), 76 g. Vegetables  Alfalfa sprouts, 17 g.  Bell peppers (sliced), 46 g.  Cabbage (shredded), 35 g.  Cauliflower (boiled), 62 g.  Celery, 51 g.  Collard greens (boiled), 95 g.  Cucumber (sliced), 52 g.  Eggplant (cubed), 41 g.  Green beans (boiled), 63 g.  Lettuce (shredded), 1 c / 36 g.  Onions (sauteed), 44 g.  Radishes (sliced), 58 g.  Spaghetti squash, 51 g. Other Foods and Drinks  Angel food cake, 1 slice / 28 g.  Black tea.  Brown rice (cooked), 98 g.  Butter croissant, 1 medium / 57 g.  Carbonated soda.  Coffee.  Cheddar cheese (diced), 66 g.  Corn flake cereal (dry), 14 g.  Cottage cheese, 118 g.  Cream of rice cereal (cooked), 122 g.  Cream of wheat cereal (cooked), 126 g.  Crisped rice cereal (dry), 14 g.  Egg (boiled, fried, poached, omelet, scrambled), 1 large / 46 61 g.  English muffin, 1 muffin / 57 g.  Frozen ice pop, 1 pop / 55 g.  Graham cracker, 1 large rectangular cracker / 14 g.  Jelly beans, 112  g.  Non-dairy whipped topping.  Oatmeal, 88 g.  Orange sherbet, 74 g.  Puffed rice cereal (dry), 7 g.  Pasta (cooked), 70 g.  Rice cakes, 4 cakes / 36   g.  Sugared doughnut, 4 oz / 116 g.  White bread, 1 slice / 30 g.  White rice (cooked), 79 93 g.  Wild rice (cooked), 82 g.  Yellow cake, 1 slice / 68 g. Document Released: 07/13/2005 Document Revised: 11/15/2012 Document Reviewed: 04/14/2012 [employer] Patient Information 2014 [employer].  

## 2014-04-12 NOTE — Progress Notes (Signed)
Hematology and Oncology Follow Up Visit  Erika [name] [id number] 12/18/1970 43 y.o. 04/12/2014 10:53 PM     Principle Diagnosis:Erika [name] 43 y.o. female with stage IIIB breast cancer.     Prior Therapy:  1. left sided breast mass show up about 6 months prior to diagnosis. 2 months prior this began getting larger rapidly. She was seen in an urgent care where this mass was thought to be an abscess. Apparently there was an attempt to excise or drain this that was not successful and she was referred to Dr. Donne [name] for evaluation. She underwent punch biopsy on 04/23/13 of her skin and the mass. She was then also sent for mm/us. The biopsy shows invasive high grade carcinoma that appears to be breast. It is ER 14%, PR negative, HER-2/neu negative with a Ki-67 that approaches 100%. She has also undergone mm with at least 10 cm upper inner left breast tumor with mild overyling skin thickening. There is also a 3x10 mm cluster of calcs near there also. There is indeterminate 7 mm level 1 left axillary node, she underwent a lymph node biopsy that was positive for disease. No mm evidence of right breast malignancy. An MRI of the breasts was done on 05/22/13 and showed a 14 cm mass in the left breast protruding from the skin and also a 2.5 cm level II axillary node.   2. S/P neoadjuvant chemotherapy. Adriamycin and Cytoxan began on 05/25/13. She completed 4 cycles with Neulasta support. Taxol carbo began on 8/8. This was discontinued due to neuropathy, she started on Gemzar/Carbo on 8/22- 11/2013.   #3 staging PET/CT performed on 06/13/2013 revealed no evidence of distant disease.   #4 anemia of iron deficiency. She has received parenteral iron in the past. This was associated with menorrhagia, the patient has since been receiving Zoladex with great improvement of her hemoglobin and iron studies.   #5 Right upper extremity DVT: Patient is on Lovenox since July 2014 completed all anti-coagulation in 12/14.    #6 status post left mastectomy with axillary lymph node dissection. Final pathology did reveal 6.5 cm residual disease 0 of 12 lymph nodes positive for metastatic disease. Tumor was estrogen receptor +14% progesterone receptor negative HER-2/neu negative  #7 Patient is s/p adjuvant radiation therapy from 02/04/14 through 03/18/2014, Xeloda was added 2-3 weeks into treatment  Current therapy:  Observation  Interim History: Erika [name] 43 y.o. female with stage IIIB breast cancer here for follow up and evaluation today. She recently completed radiation therapy with concurrent Xeloda.  She tolerated this essentially well.  She did have some desquamation on her left chest wall that is almost completely healed.  She denies fevers, chills, nausea, vomiting, constipation, diarrhea, unintentional weight loss, and any new pain.   She continues to have a port, and would like another set of scans prior to having her port removed.  Otherwise, a 10 point ROS is neg.   Medications:  Current Outpatient Prescriptions  Medication Sig Dispense Refill  . cyclobenzaprine (FLEXERIL) 5 MG tablet TAKE 1 TABLET BY MOUTH THREE TIMES A DAY FOR MUSCLE SPASMS  30 tablet  1  . fentaNYL (DURAGESIC - DOSED MCG/HR) 25 MCG/HR patch Place 1 patch (25 mcg total) onto the skin every 3 (three) days. Use with 75 mcg patch to equal 100 mcg.  10 patch  0  . fentaNYL (DURAGESIC - DOSED MCG/HR) 75 MCG/HR Place 1 patch (75 mcg total) onto the skin every 3 (three) days.  10 patch  0  .  gabapentin (NEURONTIN) 300 MG capsule Take 1 capsule (300 mg total) by mouth 3 (three) times daily.  90 capsule  3  . ibuprofen (ADVIL,MOTRIN) 200 MG tablet Take 600-800 mg by mouth every 4 (four) hours as needed for mild pain or moderate pain.       Marland Kitchen oxyCODONE (OXY IR/ROXICODONE) 5 MG immediate release tablet Take 1 tablet (5 mg total) by mouth every 12 (twelve) hours as needed for moderate pain or severe pain.  60 tablet  0  . PRESCRIPTION MEDICATION PT  REPORTS RECEIVING A SHOT ONCE A MONTH AT CANCER CENTER TO STOP HER MENSTRUAL PERIODS - I SEE DOCUMENTATION IN OFFICE NOTES FROM CANCER CENTER THAT PT WAS GETTING ZOLADEX SQ EVERY 28 DAYS.      Marland Kitchen traMADol (ULTRAM) 50 MG tablet Take 1-2 tablets (50-100 mg total) by mouth every 6 (six) hours as needed.  30 tablet  1  . UNABLE TO FIND Rx: L8000- Post Surgical Bra (Quantity: 6) E4235- Non Silicone Breast prosthesis (Quantity: 1) Dx: 174.9; Left mastectomy  1 each  0  . zolpidem (AMBIEN) 5 MG tablet Take 5 mg by mouth at bedtime as needed for sleep.      Marland Kitchen lidocaine-prilocaine (EMLA) cream Apply 1 application topically every 14 (fourteen) days. Every other Friday prior to chemo      . LORazepam (ATIVAN) 0.5 MG tablet Take 0.5 mg by mouth every 6 (six) hours as needed for anxiety.      . non-metallic deodorant Erika [name]) MISC Apply 1 application topically daily as needed.      . ondansetron (ZOFRAN) 8 MG tablet Take 1 tablet (8 mg total) by mouth every 8 (eight) hours as needed for nausea or vomiting.  30 tablet  0  . potassium chloride SA (K-DUR,KLOR-CON) 20 MEQ tablet Take 1 tablet (20 mEq total) by mouth daily.  7 tablet  0  . silver sulfADIAZINE (SILVADENE) 1 % cream Apply 1 application topically 2 (two) times daily.       No current facility-administered medications for this visit.   Facility-Administered Medications Ordered in Other Visits  Medication Dose Route Frequency Provider Last Rate Last Dose  . topical emolient (BIAFINE) emulsion   Topical Daily Erika Gibson, MD         Allergies:  Allergies  Allergen Reactions  . Doxycycline Nausea And Vomiting    Medical History: Past Medical History  Diagnosis Date  . Breast cancer     Invasive High Grade Carcinoma  . Headache(784.0)     Hx: of Migraines  . Status post chemotherapy     Adriamycin and Cytoxan  . Anxiety     ANXIOUS ABOUT HAVEING BIG SURGERY - MASTECTOMY  . 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 DVT (deep venous  thrombosis)     RT ARM-PT ON LOVENOX  . Hx of iron deficiency anemia 11/26/13  . S/P radiation therapy 02-04-14 to 03-18-14                               1) Left Chest Wall / 50 Gy in 25 fractions/ 2) Left Supraclavicular fossa/ 46 Gy in 23 fractions / 3) Left Posterior Axillary boost / 8.533 Gy in 23 fractions/ 4) Left Chest Wall Scar boost / 10 Gy in 5 fractions    Surgical History:  Past Surgical History  Procedure Laterality Date  . Tubal ligation    . Portacath placement  Right 05/22/2013    Procedure: INSERTION PORT-A-CATH;  Surgeon: [name], MD;  Location: [facility];  Service: General;  Laterality: Right;  . Left breast needle core biopsy Left 05/21/13  . Left breast needle core  biopsy Left 06/22/13  . Mastectomy modified radical Left 11/15/2013    Procedure: LEFT MASTECTOMY MODIFIED RADICAL;  Surgeon: [name], MD;  Location: [facility];  Service: General;  Laterality: Left;     Review of Systems: A 10 point review of systems was conducted and is otherwise negative except for what is noted above.     Physical Exam: Blood pressure 113/81, pulse 84, temperature 98 F (36.7 C), temperature source Oral, resp. rate 20, height 5' 5.5" (1.664 m), weight 216 lb 14.4 oz (98.385 kg), last menstrual period 05/30/2013. GENERAL: Patient is a well appearing female in no acute distress HEENT:  Sclerae anicteric.  Oropharynx clear and moist. No ulcerations or evidence of oropharyngeal candidiasis. Neck is supple.  NODES:  No cervical, supraclavicular, or axillary lymphadenopathy palpated.  BREAST EXAM:  Left chest wall s/p mastectomy, Area is hyperpigmented and previous desquamation nearly completely healed.   LUNGS:  Clear to auscultation bilaterally.  No wheezes or rhonchi. HEART:  Regular rate and rhythm. No murmur appreciated. ABDOMEN:  Soft, nontender.  Positive, normoactive bowel sounds. No organomegaly palpated. MSK:  No focal spinal tenderness to palpation. Full range of motion  bilaterally in the upper extremities. EXTREMITIES:  No peripheral edema.   SKIN:  Clear with no obvious rashes or skin changes. No nail dyscrasia. NEURO:  Nonfocal. Well oriented.  Appropriate affect. ECOG PERFORMANCE STATUS: 1 - Symptomatic but completely ambulatory   Lab Results: Lab Results  Component Value Date   WBC 5.8 04/10/2014   HGB 11.3* 04/10/2014   HCT 35.2 04/10/2014   MCV 86.3 04/10/2014   PLT 258 04/10/2014     Chemistry      Component Value Date/Time   NA 140 04/10/2014 1445   NA 138 11/16/2013 0448   K 3.3* 04/10/2014 1445   K 3.5 11/16/2013 0448   CL 103 11/16/2013 0448   CL 102 06/01/2013 1500   CO2 29 04/10/2014 1445   CO2 27 11/16/2013 0448   BUN 7.7 04/10/2014 1445   BUN 8 11/16/2013 0448   CREATININE 0.9 04/10/2014 1445   CREATININE 0.86 11/16/2013 0448      Component Value Date/Time   CALCIUM 10.3 04/10/2014 1445   CALCIUM 8.9 11/16/2013 0448   ALKPHOS 123 04/10/2014 1445   ALKPHOS 111 05/18/2013 1205   AST 17 04/10/2014 1445   AST 18 05/18/2013 1205   ALT 11 04/10/2014 1445   ALT 16 05/18/2013 1205   BILITOT 0.36 04/10/2014 1445   BILITOT 0.2* 05/18/2013 1205       Assessment and Plan: [name] 43 y.o. female with  1.  Stage IIIB breast cancer:  Patient is s/p neoadjuvant chemotherapy, mastectomy, and radiation with concurrent Xeloda.  She is nearly completely healed from the radiation therapy.  I have ordered her a CT chest/abdomen/pelvis to ensure she has no sign of progression or further breast cancer.  After her scans, we will send her back to Dr. Donne [name] for port removal.  She is receiving zoladex every 4 weeks and will continue this.    2.  Pain control:  The patient does have severe pain at her left mastectomy site and generalized pain.  She is taking Fentanyl patches 165mg every 72 hours and Oxycodone 512mtwice a day.  I refilled both of these today.   3.  Left arm lymphedema.  She previously went to PT.  She plans on contacting PT for a f/u appointment.     4. Hypokalemia:  Her potassium is slightly low today.  I prescribed potassium for her x 1 week and gave her information on potassium rich foods in her AVS.    Ms. Weismann will return tomorrow for labs, and in one month for labs and evaluation.  She knows that if she has any questions or concerns in the interim she knows to contact us, we can certainly see her sooner if needed.    I spent 25 minutes counseling the patient face to face.  The total time spent in the appointment was 30 minutes.  Minette Headland, [place] [phone] 04/12/2014 10:53 PM

## 2014-04-17 ENCOUNTER — Telehealth: Payer: Self-pay | Admitting: Adult Health

## 2014-04-17 ENCOUNTER — Ambulatory Visit
Admission: RE | Admit: 2014-04-17 | Payer: PRIVATE HEALTH INSURANCE | Source: Ambulatory Visit | Admitting: Radiation Oncology

## 2014-04-17 NOTE — Telephone Encounter (Signed)
m °

## 2014-04-29 ENCOUNTER — Telehealth: Payer: Self-pay

## 2014-04-29 NOTE — Telephone Encounter (Signed)
Pt called requesting referral to PT.   Referral made by [name] has been closed.

## 2014-04-30 ENCOUNTER — Telehealth: Payer: Self-pay

## 2014-04-30 NOTE — Telephone Encounter (Signed)
Pt would like to know if it is ok to take supplements: Chaga, Ganoaderma.   Routed to [employer]

## 2014-04-30 NOTE — Telephone Encounter (Signed)
Im fine with that.  Thanks,  Vina

## 2014-05-03 ENCOUNTER — Other Ambulatory Visit: Payer: Self-pay | Admitting: Adult Health

## 2014-05-03 ENCOUNTER — Encounter: Payer: Self-pay | Admitting: *Deleted

## 2014-05-03 ENCOUNTER — Ambulatory Visit (HOSPITAL_BASED_OUTPATIENT_CLINIC_OR_DEPARTMENT_OTHER): Payer: PRIVATE HEALTH INSURANCE

## 2014-05-03 VITALS — BP 104/62 | HR 90 | Temp 97.7°F

## 2014-05-03 DIAGNOSIS — C50219 Malignant neoplasm of upper-inner quadrant of unspecified female breast: Secondary | ICD-10-CM

## 2014-05-03 DIAGNOSIS — Z95828 Presence of other vascular implants and grafts: Secondary | ICD-10-CM

## 2014-05-03 DIAGNOSIS — Z5111 Encounter for antineoplastic chemotherapy: Secondary | ICD-10-CM

## 2014-05-03 MED ORDER — HEPARIN SOD (PORK) LOCK FLUSH 100 UNIT/ML IV SOLN
500.0000 [IU] | Freq: Once | INTRAVENOUS | Status: AC
  Administered 2014-05-03: 500 [IU] via INTRAVENOUS
  Filled 2014-05-03: qty 5

## 2014-05-03 MED ORDER — SODIUM CHLORIDE 0.9 % IJ SOLN
10.0000 mL | INTRAMUSCULAR | Status: DC | PRN
  Administered 2014-05-03: 10 mL via INTRAVENOUS
  Filled 2014-05-03: qty 10

## 2014-05-03 MED ORDER — GOSERELIN ACETATE 3.6 MG ~~LOC~~ IMPL
3.6000 mg | DRUG_IMPLANT | Freq: Once | SUBCUTANEOUS | Status: AC
  Administered 2014-05-03: 3.6 mg via SUBCUTANEOUS
  Filled 2014-05-03: qty 3.6

## 2014-05-03 NOTE — Progress Notes (Signed)
Advised pt is was okay to travel to [place] as long as she wears her compression sleeve when flying per Charlestine Massed, NP.

## 2014-05-03 NOTE — Telephone Encounter (Signed)
Patient was in office today for injection and wanted to know if we were able to determine if the previous questioned supplements was safe to take. Instructed patient that we have forwarded this to our pharmacist to compare with her current meds and breast cancer treatment. Patient also wants to know if it is safe for her to travel by plane, a 3 hour flight, to [place] in the next 2-4 weeks. If she can she would like a note written since she has history of blood clot. Informed patient I would discuss with Charlestine Massed, NP, and we would be back with her after [facility]. Patient verbalized understanding.

## 2014-05-13 ENCOUNTER — Other Ambulatory Visit: Payer: Self-pay | Admitting: *Deleted

## 2014-05-13 DIAGNOSIS — C50219 Malignant neoplasm of upper-inner quadrant of unspecified female breast: Secondary | ICD-10-CM

## 2014-05-13 DIAGNOSIS — Z9010 Acquired absence of unspecified breast and nipple: Secondary | ICD-10-CM

## 2014-05-13 MED ORDER — OXYCODONE HCL 5 MG PO TABS: 5.0000 mg | ORAL_TABLET | Freq: Two times a day (BID) | ORAL | Status: DC | PRN

## 2014-05-13 MED ORDER — FENTANYL 100 MCG/HR TD PT72: 100.0000 ug | MEDICATED_PATCH | TRANSDERMAL | Status: DC

## 2014-05-13 NOTE — Telephone Encounter (Signed)
Patient calling in for refills on Duragesic 75 & 25 mcg patches. We will combine doses at this time to 1 patch total dose of 161mcg. Patient informed.

## 2014-05-14 ENCOUNTER — Ambulatory Visit: Payer: No Typology Code available for payment source | Admitting: Physical Therapy

## 2014-05-15 ENCOUNTER — Ambulatory Visit: Payer: No Typology Code available for payment source | Attending: Oncology | Admitting: Physical Therapy

## 2014-05-15 DIAGNOSIS — M25519 Pain in unspecified shoulder: Secondary | ICD-10-CM | POA: Insufficient documentation

## 2014-05-15 DIAGNOSIS — IMO0001 Reserved for inherently not codable concepts without codable children: Secondary | ICD-10-CM | POA: Insufficient documentation

## 2014-05-15 DIAGNOSIS — M24519 Contracture, unspecified shoulder: Secondary | ICD-10-CM | POA: Insufficient documentation

## 2014-05-21 ENCOUNTER — Ambulatory Visit: Payer: No Typology Code available for payment source | Admitting: Physical Therapy

## 2014-05-23 ENCOUNTER — Ambulatory Visit: Payer: No Typology Code available for payment source

## 2014-05-28 ENCOUNTER — Other Ambulatory Visit: Payer: Self-pay | Admitting: *Deleted

## 2014-05-28 ENCOUNTER — Ambulatory Visit: Payer: No Typology Code available for payment source | Admitting: Physical Therapy

## 2014-05-28 MED ORDER — FENTANYL 50 MCG/HR TD PT72: 100.0000 ug | MEDICATED_PATCH | TRANSDERMAL | Status: DC

## 2014-05-28 NOTE — Telephone Encounter (Signed)
Patient calling in to request refills on Duragesic patches. She states that she is having difficulty with the large ill fitting patch sticking for the entire 3 days, states they "crinkle up on me". We will attempt reducing the size of patches to 35mcg (x2) in efforts patient will get more use of pain meds.

## 2014-05-30 ENCOUNTER — Ambulatory Visit: Payer: No Typology Code available for payment source

## 2014-05-30 ENCOUNTER — Other Ambulatory Visit: Payer: Self-pay

## 2014-05-31 ENCOUNTER — Ambulatory Visit: Payer: No Typology Code available for payment source

## 2014-05-31 ENCOUNTER — Ambulatory Visit (HOSPITAL_BASED_OUTPATIENT_CLINIC_OR_DEPARTMENT_OTHER): Payer: No Typology Code available for payment source

## 2014-05-31 DIAGNOSIS — C50212 Malignant neoplasm of upper-inner quadrant of left female breast: Secondary | ICD-10-CM

## 2014-05-31 DIAGNOSIS — Z5111 Encounter for antineoplastic chemotherapy: Secondary | ICD-10-CM

## 2014-05-31 DIAGNOSIS — C50219 Malignant neoplasm of upper-inner quadrant of unspecified female breast: Secondary | ICD-10-CM

## 2014-05-31 MED ORDER — GOSERELIN ACETATE 3.6 MG ~~LOC~~ IMPL
3.6000 mg | DRUG_IMPLANT | Freq: Once | SUBCUTANEOUS | Status: AC
  Administered 2014-05-31: 3.6 mg via SUBCUTANEOUS
  Filled 2014-05-31: qty 3.6

## 2014-05-31 NOTE — Patient Instructions (Signed)
Goserelin injection What is this medicine? GOSERELIN (GOE se rel in) is similar to a hormone found in the body. It lowers the amount of sex hormones that the body makes. Men will have lower testosterone levels and women will have lower estrogen levels while taking this medicine. In men, this medicine is used to treat prostate cancer; the injection is either given once per month or once every 12 weeks. A once per month injection (only) is used to treat women with endometriosis, dysfunctional uterine bleeding, or advanced breast cancer. This medicine may be used for other purposes; ask your health care provider or pharmacist if you have questions. COMMON BRAND NAME(S): Zoladex What should I tell my health care provider before I take this medicine? They need to know if you have any of these conditions (some only apply to women): -diabetes -heart disease or previous heart attack -high blood pressure -high cholesterol -kidney disease -osteoporosis or low bone density -problems passing urine -spinal cord injury -stroke -tobacco smoker -an unusual or allergic reaction to goserelin, hormone therapy, other medicines, foods, dyes, or preservatives -pregnant or trying to get pregnant -breast-feeding How should I use this medicine? This medicine is for injection under the skin. It is given by a health care professional in a hospital or clinic setting. Men receive this injection once every 4 weeks or once every 12 weeks. Women will only receive the once every 4 weeks injection. Talk to your pediatrician regarding the use of this medicine in children. Special care may be needed. Overdosage: If you think you have taken too much of this medicine contact a poison control center or emergency room at once. NOTE: This medicine is only for you. Do not share this medicine with others. What if I miss a dose? It is important not to miss your dose. Call your doctor or health care professional if you are unable to  keep an appointment. What may interact with this medicine? -female hormones like estrogen -herbal or dietary supplements like black cohosh, chasteberry, or DHEA -female hormones like testosterone -prasterone This list may not describe all possible interactions. Give your health care provider a list of all the medicines, herbs, non-prescription drugs, or dietary supplements you use. Also tell them if you smoke, drink alcohol, or use illegal drugs. Some items may interact with your medicine. What should I watch for while using this medicine? Visit your doctor or health care professional for regular checks on your progress. Your symptoms may appear to get worse during the first weeks of this therapy. Tell your doctor or healthcare professional if your symptoms do not start to get better or if they get worse after this time. Your bones may get weaker if you take this medicine for a long time. If you smoke or frequently drink alcohol you may increase your risk of bone loss. A family history of osteoporosis, chronic use of drugs for seizures (convulsions), or corticosteroids can also increase your risk of bone loss. Talk to your doctor about how to keep your bones strong. This medicine should stop regular monthly menstration in women. Tell your doctor if you continue to menstrate. Women should not become pregnant while taking this medicine or for 12 weeks after stopping this medicine. Women should inform their doctor if they wish to become pregnant or think they might be pregnant. There is a potential for serious side effects to an unborn child. Talk to your health care professional or pharmacist for more information. Do not breast-feed an infant while taking   this medicine. Men should inform their doctors if they wish to father a child. This medicine may lower sperm counts. Talk to your health care professional or pharmacist for more information. What side effects may I notice from receiving this  medicine? Side effects that you should report to your doctor or health care professional as soon as possible: -allergic reactions like skin rash, itching or hives, swelling of the face, lips, or tongue -bone pain -breathing problems -changes in vision -chest pain -feeling faint or lightheaded, falls -fever, chills -pain, swelling, warmth in the leg -pain, tingling, numbness in the hands or feet -swelling of the ankles, feet, hands -trouble passing urine or change in the amount of urine -unusually high or low blood pressure -unusually weak or tired Side effects that usually do not require medical attention (report to your doctor or health care professional if they continue or are bothersome): -change in sex drive or performance -changes in breast size in both males and females -changes in emotions or moods -headache -hot flashes -irritation at site where injected -loss of appetite -skin problems like acne, dry skin -vaginal dryness This list may not describe all possible side effects. Call your doctor for medical advice about side effects. You may report side effects to FDA at 1-800-FDA-1088. Where should I keep my medicine? This drug is given in a hospital or clinic and will not be stored at home. NOTE: This sheet is a summary. It may not cover all possible information. If you have questions about this medicine, talk to your doctor, pharmacist, or health care provider.  2015, [employer]. (2009-04-15 13:28:29)

## 2014-05-31 NOTE — Progress Notes (Signed)
Patient refused flush at this time. States she will get it next time she is scheduled. Advised patient of getting flushes done every 6-8 weeks.

## 2014-06-04 ENCOUNTER — Ambulatory Visit: Payer: No Typology Code available for payment source | Admitting: Physical Therapy

## 2014-06-06 ENCOUNTER — Ambulatory Visit: Payer: No Typology Code available for payment source

## 2014-06-11 ENCOUNTER — Ambulatory Visit: Payer: No Typology Code available for payment source | Admitting: Physical Therapy

## 2014-06-12 ENCOUNTER — Other Ambulatory Visit: Payer: Self-pay

## 2014-06-12 DIAGNOSIS — C50212 Malignant neoplasm of upper-inner quadrant of left female breast: Secondary | ICD-10-CM

## 2014-06-12 MED ORDER — FENTANYL 25 MCG/HR TD PT72: 25.0000 ug | MEDICATED_PATCH | TRANSDERMAL | Status: DC

## 2014-06-12 MED ORDER — OXYCODONE HCL 5 MG PO TABS: 5.0000 mg | ORAL_TABLET | Freq: Two times a day (BID) | ORAL | Status: DC | PRN

## 2014-06-12 MED ORDER — FENTANYL 75 MCG/HR TD PT72: 75.0000 ug | MEDICATED_PATCH | TRANSDERMAL | Status: DC

## 2014-06-12 NOTE — Telephone Encounter (Signed)
Pt requesting change in Duragesic patches from 2x 50 to 1x25 and 1x75.  Oxy and duragesic patches printed, signed  and placed in scrip book for patient pick up.  Let pt know would be ready for pick up after 145 today.  Pt voiced understanding.

## 2014-06-13 ENCOUNTER — Ambulatory Visit: Payer: No Typology Code available for payment source | Attending: Oncology

## 2014-06-13 DIAGNOSIS — IMO0001 Reserved for inherently not codable concepts without codable children: Secondary | ICD-10-CM | POA: Insufficient documentation

## 2014-06-13 DIAGNOSIS — M25519 Pain in unspecified shoulder: Secondary | ICD-10-CM | POA: Insufficient documentation

## 2014-06-13 DIAGNOSIS — M24519 Contracture, unspecified shoulder: Secondary | ICD-10-CM | POA: Insufficient documentation

## 2014-06-18 ENCOUNTER — Ambulatory Visit: Payer: No Typology Code available for payment source | Admitting: Physical Therapy

## 2014-06-20 ENCOUNTER — Ambulatory Visit: Payer: No Typology Code available for payment source

## 2014-06-25 ENCOUNTER — Other Ambulatory Visit: Payer: Self-pay | Admitting: *Deleted

## 2014-06-25 ENCOUNTER — Ambulatory Visit: Payer: No Typology Code available for payment source | Admitting: Physical Therapy

## 2014-06-25 DIAGNOSIS — C50219 Malignant neoplasm of upper-inner quadrant of unspecified female breast: Secondary | ICD-10-CM

## 2014-06-26 ENCOUNTER — Other Ambulatory Visit (HOSPITAL_BASED_OUTPATIENT_CLINIC_OR_DEPARTMENT_OTHER): Payer: PRIVATE HEALTH INSURANCE

## 2014-06-26 ENCOUNTER — Ambulatory Visit (HOSPITAL_COMMUNITY): Payer: PRIVATE HEALTH INSURANCE

## 2014-06-26 ENCOUNTER — Ambulatory Visit (HOSPITAL_COMMUNITY)
Admission: RE | Admit: 2014-06-26 | Discharge: 2014-06-26 | Disposition: A | Discharge status: Home / Self Care | Payer: PRIVATE HEALTH INSURANCE | Source: Ambulatory Visit | Attending: Adult Health | Admitting: Adult Health

## 2014-06-26 ENCOUNTER — Telehealth: Payer: Self-pay | Admitting: *Deleted

## 2014-06-26 DIAGNOSIS — J90 Pleural effusion, not elsewhere classified: Secondary | ICD-10-CM | POA: Insufficient documentation

## 2014-06-26 DIAGNOSIS — C50219 Malignant neoplasm of upper-inner quadrant of unspecified female breast: Secondary | ICD-10-CM

## 2014-06-26 DIAGNOSIS — J9819 Other pulmonary collapse: Secondary | ICD-10-CM | POA: Insufficient documentation

## 2014-06-26 DIAGNOSIS — K8020 Calculus of gallbladder without cholecystitis without obstruction: Secondary | ICD-10-CM | POA: Insufficient documentation

## 2014-06-26 DIAGNOSIS — E876 Hypokalemia: Secondary | ICD-10-CM

## 2014-06-26 DIAGNOSIS — K7689 Other specified diseases of liver: Secondary | ICD-10-CM | POA: Insufficient documentation

## 2014-06-26 DIAGNOSIS — R918 Other nonspecific abnormal finding of lung field: Secondary | ICD-10-CM | POA: Insufficient documentation

## 2014-06-26 DIAGNOSIS — Z9010 Acquired absence of unspecified breast and nipple: Secondary | ICD-10-CM | POA: Insufficient documentation

## 2014-06-26 DIAGNOSIS — C50919 Malignant neoplasm of unspecified site of unspecified female breast: Secondary | ICD-10-CM | POA: Insufficient documentation

## 2014-06-26 LAB — COMPREHENSIVE METABOLIC PANEL (CC13)
ALBUMIN: 2.9 g/dL — AB (ref 3.5–5.0)
ALK PHOS: 120 U/L (ref 40–150)
ALT: 10 U/L (ref 0–55)
AST: 16 U/L (ref 5–34)
Anion Gap: 9 mEq/L (ref 3–11)
BUN: 8.1 mg/dL (ref 7.0–26.0)
CALCIUM: 9.7 mg/dL (ref 8.4–10.4)
CO2: 30 mEq/L — ABNORMAL HIGH (ref 22–29)
Chloride: 100 mEq/L (ref 98–109)
Creatinine: 0.8 mg/dL (ref 0.6–1.1)
GLUCOSE: 98 mg/dL (ref 70–140)
POTASSIUM: 3.3 meq/L — AB (ref 3.5–5.1)
Sodium: 138 mEq/L (ref 136–145)
TOTAL PROTEIN: 8.2 g/dL (ref 6.4–8.3)
Total Bilirubin: 0.4 mg/dL (ref 0.20–1.20)

## 2014-06-26 LAB — CBC WITH DIFFERENTIAL/PLATELET
BASO%: 0.7 % (ref 0.0–2.0)
Basophils Absolute: 0.1 10*3/uL (ref 0.0–0.1)
EOS ABS: 0.3 10*3/uL (ref 0.0–0.5)
EOS%: 3.8 % (ref 0.0–7.0)
HEMATOCRIT: 35.1 % (ref 34.8–46.6)
HGB: 11.3 g/dL — ABNORMAL LOW (ref 11.6–15.9)
LYMPH%: 22 % (ref 14.0–49.7)
MCH: 27.8 pg (ref 25.1–34.0)
MCHC: 32.1 g/dL (ref 31.5–36.0)
MCV: 86.7 fL (ref 79.5–101.0)
MONO#: 0.5 10*3/uL (ref 0.1–0.9)
MONO%: 7.2 % (ref 0.0–14.0)
NEUT#: 5 10*3/uL (ref 1.5–6.5)
NEUT%: 66.3 % (ref 38.4–76.8)
Platelets: 425 10*3/uL — ABNORMAL HIGH (ref 145–400)
RBC: 4.05 10*6/uL (ref 3.70–5.45)
RDW: 14.2 % (ref 11.2–14.5)
WBC: 7.5 10*3/uL (ref 3.9–10.3)
lymph#: 1.6 10*3/uL (ref 0.9–3.3)

## 2014-06-26 MED ORDER — IOHEXOL 300 MG/ML  SOLN
50.0000 mL | Freq: Once | INTRAMUSCULAR | Status: AC | PRN
  Administered 2014-06-26: 50 mL via ORAL

## 2014-06-26 MED ORDER — IOHEXOL 300 MG/ML  SOLN
100.0000 mL | Freq: Once | INTRAMUSCULAR | Status: AC | PRN
  Administered 2014-06-26: 100 mL via INTRAVENOUS

## 2014-06-26 NOTE — Telephone Encounter (Signed)
Left vm with date and time of f/u appt with Dr. Jana Hakim for 07/05/14 at 4:00PM. Request pt to call back to confirm. Contact information given.

## 2014-06-27 ENCOUNTER — Ambulatory Visit (HOSPITAL_BASED_OUTPATIENT_CLINIC_OR_DEPARTMENT_OTHER): Payer: PRIVATE HEALTH INSURANCE

## 2014-06-27 ENCOUNTER — Ambulatory Visit: Payer: PRIVATE HEALTH INSURANCE

## 2014-06-27 ENCOUNTER — Telehealth: Payer: Self-pay | Admitting: Adult Health

## 2014-06-27 ENCOUNTER — Encounter: Payer: Self-pay | Admitting: Adult Health

## 2014-06-27 ENCOUNTER — Other Ambulatory Visit: Payer: Self-pay | Admitting: Adult Health

## 2014-06-27 ENCOUNTER — Ambulatory Visit (HOSPITAL_BASED_OUTPATIENT_CLINIC_OR_DEPARTMENT_OTHER): Payer: PRIVATE HEALTH INSURANCE | Admitting: Adult Health

## 2014-06-27 ENCOUNTER — Ambulatory Visit: Payer: No Typology Code available for payment source

## 2014-06-27 VITALS — BP 120/85 | HR 100 | Temp 98.5°F | Resp 20

## 2014-06-27 VITALS — BP 125/73 | HR 107 | Temp 98.8°F | Resp 20 | Ht 65.5 in | Wt 201.1 lb

## 2014-06-27 DIAGNOSIS — D509 Iron deficiency anemia, unspecified: Secondary | ICD-10-CM

## 2014-06-27 DIAGNOSIS — C50212 Malignant neoplasm of upper-inner quadrant of left female breast: Secondary | ICD-10-CM

## 2014-06-27 DIAGNOSIS — C50219 Malignant neoplasm of upper-inner quadrant of unspecified female breast: Secondary | ICD-10-CM

## 2014-06-27 DIAGNOSIS — Z86718 Personal history of other venous thrombosis and embolism: Secondary | ICD-10-CM

## 2014-06-27 DIAGNOSIS — J90 Pleural effusion, not elsewhere classified: Secondary | ICD-10-CM

## 2014-06-27 DIAGNOSIS — Z5111 Encounter for antineoplastic chemotherapy: Secondary | ICD-10-CM

## 2014-06-27 DIAGNOSIS — C773 Secondary and unspecified malignant neoplasm of axilla and upper limb lymph nodes: Secondary | ICD-10-CM

## 2014-06-27 DIAGNOSIS — C787 Secondary malignant neoplasm of liver and intrahepatic bile duct: Secondary | ICD-10-CM

## 2014-06-27 DIAGNOSIS — N644 Mastodynia: Secondary | ICD-10-CM

## 2014-06-27 DIAGNOSIS — G4700 Insomnia, unspecified: Secondary | ICD-10-CM

## 2014-06-27 MED ORDER — ZOLPIDEM TARTRATE 5 MG PO TABS: 5.0000 mg | ORAL_TABLET | Freq: Every evening | ORAL | Status: DC | PRN

## 2014-06-27 MED ORDER — GOSERELIN ACETATE 3.6 MG ~~LOC~~ IMPL
3.6000 mg | DRUG_IMPLANT | Freq: Once | SUBCUTANEOUS | Status: AC
  Administered 2014-06-27: 3.6 mg via SUBCUTANEOUS
  Filled 2014-06-27: qty 3.6

## 2014-06-27 NOTE — Progress Notes (Signed)
ID: Erika [name] OB: 09-Aug-1971  MR#: [id number]  CSN#:[id number]  PCP: [name], MD GYN:   SU: Dr. [name] OTHER MD:  CHIEF COMPLAINT:  Newly metastatic breast cancer here for evaluation and discussion of CT scans.    BREAST CANCER HISTORY:  Patient developed a left sided breast mass show up about 6 months prior to diagnosis. 2 months prior this began getting larger rapidly. She was seen in an urgent care where this mass was thought to be an abscess. Apparently there was an attempt to excise or drain this that was not successful and she was referred to Dr. [name] for evaluation. She underwent punch biopsy on 04/23/13 of her skin and the mass. She was then also sent for mm/us. The biopsy shows invasive high grade carcinoma that appears to be breast. It is ER 14%, PR negative, HER-2/neu negative with a Ki-67 that approaches 100%. She has also undergone mm with at least 10 cm upper inner left breast tumor with mild overyling skin thickening. There is also a 3x10 mm cluster of calcs near there also. There is indeterminate 7 mm level 1 left axillary node, she underwent a lymph node biopsy that was positive for disease. No mm evidence of right breast malignancy. An MRI of the breasts was done on 05/22/13 and showed a 14 cm mass in the left breast protruding from the skin and also a 2.5 cm level II axillary node.  A staging PET/CT was performed on 06/13/2013 revealed no evidence of distant disease.   INTERVAL HISTORY:  Erika [name] is doing moderately well today. She is here today for evaluation and discussion of her recent CT scans.  She has recently completed adjuvant radiation therapy with concurrent Xeloda a couple of months ago.  She had previously had left mastectomy post surgical pain, and breast pain that she was on Fentanyl patches and Oxycodone for as recommended by Dr. Humphrey Rolls.  However, recently she has noticed an increase in pain in her right side.  She has had some headaches as well.  (She  did repeat herself a couple of times unknowingly during the appointment).  She is fatigued, however she is walking 2 miles per day and is losing weight.  Otherwise, she denies fevers, chills, nausea, vomiting, constipation, diarrhea, shortness of breath, cough, or any further concerns.   REVIEW OF SYSTEMS: A 10 point review of systems was conducted and is otherwise negative except for what is noted above.     PAST MEDICAL HISTORY: Past Medical History  Diagnosis Date  . Breast cancer     Invasive High Grade Carcinoma  . Headache(784.0)     Hx: of Migraines  . Status post chemotherapy     Adriamycin and Cytoxan  . Anxiety     ANXIOUS ABOUT HAVEING BIG SURGERY - MASTECTOMY  . 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 DVT (deep venous thrombosis)     RT ARM-PT ON LOVENOX  . Hx of iron deficiency anemia 11/26/13  . S/P radiation therapy 02-04-14 to 03-18-14                               1) Left Chest Wall / 50 Gy in 25 fractions/ 2) Left Supraclavicular fossa/ 46 Gy in 23 fractions / 3) Left Posterior Axillary boost / 8.533 Gy in 23 fractions/ 4) Left Chest Wall Scar boost / 10 Gy in 5 fractions    PAST SURGICAL HISTORY:  Past Surgical History  Procedure Laterality Date  . Tubal ligation    . Portacath placement Right 05/22/2013    Procedure: INSERTION PORT-A-CATH;  Surgeon: [name], MD;  Location: [facility];  Service: General;  Laterality: Right;  . Left breast needle core biopsy Left 05/21/13  . Left breast needle core  biopsy Left 06/22/13  . Mastectomy modified radical Left 11/15/2013    Procedure: LEFT MASTECTOMY MODIFIED RADICAL;  Surgeon: [name], MD;  Location: [facility];  Service: General;  Laterality: Left;    FAMILY HISTORY Family History  Problem Relation Age of Onset  . Hypertension Mother   . Diabetes Father 70  . Stomach cancer Maternal Aunt   . Hypertension Maternal Grandmother   . Hypertension Maternal Grandfather   . Diabetes Paternal Grandmother   .  Diabetes Paternal Grandfather   . Diabetes Paternal Uncle   . Diabetes Paternal Uncle   . Diabetes Paternal Uncle     GYNECOLOGIC HISTORY:   SOCIAL HISTORY:     ADVANCED DIRECTIVES:   HEALTH MAINTENANCE: History  Substance Use Topics  . Smoking status: Never Smoker   . Smokeless tobacco: Never Used  . Alcohol Use: No      Mammogram: Colonoscopy: Bone Density Scan:  Pap Smear:  Eye Exam:  Vitamin D Level:   Lipid Panel:    Allergies  Allergen Reactions  . Doxycycline Nausea And Vomiting    Current Outpatient Prescriptions  Medication Sig Dispense Refill  . cyclobenzaprine (FLEXERIL) 5 MG tablet TAKE 1 TABLET BY MOUTH THREE TIMES A DAY FOR MUSCLE SPASMS  30 tablet  1  . fentaNYL (DURAGESIC - DOSED MCG/HR) 25 MCG/HR patch Place 1 patch (25 mcg total) onto the skin every 3 (three) days.  10 patch  0  . fentaNYL (DURAGESIC - DOSED MCG/HR) 75 MCG/HR Place 1 patch (75 mcg total) onto the skin every 3 (three) days.  10 patch  0  . gabapentin (NEURONTIN) 300 MG capsule Take 1 capsule (300 mg total) by mouth 3 (three) times daily.  90 capsule  3  . LORazepam (ATIVAN) 0.5 MG tablet Take 0.5 mg by mouth every 6 (six) hours as needed for anxiety.      Marland Kitchen oxyCODONE (OXY IR/ROXICODONE) 5 MG immediate release tablet Take 1 tablet (5 mg total) by mouth every 12 (twelve) hours as needed for severe pain or breakthrough pain.  60 tablet  0  . PRESCRIPTION MEDICATION PT REPORTS RECEIVING A SHOT ONCE A MONTH AT CANCER CENTER TO STOP HER MENSTRUAL PERIODS - I SEE DOCUMENTATION IN OFFICE NOTES FROM CANCER CENTER THAT PT WAS GETTING ZOLADEX SQ EVERY 28 DAYS.      Marland Kitchen traMADol (ULTRAM) 50 MG tablet Take 1-2 tablets (50-100 mg total) by mouth every 6 (six) hours as needed.  30 tablet  1  . UNABLE TO FIND Rx: L8000- Post Surgical Bra (Quantity: 6) V4008- Non Silicone Breast prosthesis (Quantity: 1) Dx: 174.9; Left mastectomy  1 each  0  . zolpidem (AMBIEN) 5 MG tablet Take 1 tablet (5 mg total) by  mouth at bedtime as needed for sleep.  30 tablet  0  . ibuprofen (ADVIL,MOTRIN) 200 MG tablet Take 600-800 mg by mouth every 4 (four) hours as needed for mild pain or moderate pain.       Marland Kitchen lidocaine-prilocaine (EMLA) cream Apply 1 application topically every 14 (fourteen) days. Every other Friday prior to chemo      . non-metallic deodorant (ALRA) MISC Apply 1 application topically  daily as needed.      . ondansetron (ZOFRAN) 8 MG tablet Take 1 tablet (8 mg total) by mouth every 8 (eight) hours as needed for nausea or vomiting.  30 tablet  0  . potassium chloride SA (K-DUR,KLOR-CON) 20 MEQ tablet Take 1 tablet (20 mEq total) by mouth daily.  7 tablet  0  . silver sulfADIAZINE (SILVADENE) 1 % cream Apply 1 application topically 2 (two) times daily.       No current facility-administered medications for this visit.   Facility-Administered Medications Ordered in Other Visits  Medication Dose Route Frequency Provider Last Rate Last Dose  . topical emolient (BIAFINE) emulsion   Topical Daily [name], MD        OBJECTIVE: Filed Vitals:   06/27/14 1547  BP: 125/73  Pulse: 107  Temp: 98.8 F (37.1 C)  Resp: 20     Body mass index is 32.94 kg/(m^2).     GENERAL: Patient is a well appearing female in no acute distress HEENT:  Sclerae anicteric.  Oropharynx clear and moist. No ulcerations or evidence of oropharyngeal candidiasis. Neck is supple.  NODES:  No cervical, supraclavicular, or axillary lymphadenopathy palpated.  BREAST EXAM:  Deferred. LUNGS:  Clear to auscultation bilaterally.  No wheezes or rhonchi. HEART:  Regular rate and rhythm. No murmur appreciated. ABDOMEN:  Soft, nontender.  Positive, normoactive bowel sounds. No organomegaly palpated. MSK:  No focal spinal tenderness to palpation. Full range of motion bilaterally in the upper extremities. EXTREMITIES:  No peripheral edema.   SKIN:  Clear with no obvious rashes or skin changes. No nail dyscrasia. NEURO:  Nonfocal. Well  oriented.  Appropriate affect. ECOG FS:1 - Symptomatic but completely ambulatory  LAB RESULTS:  CMP     Component Value Date/Time   NA 138 06/26/2014 1324   NA 138 11/16/2013 0448   K 3.3* 06/26/2014 1324   K 3.5 11/16/2013 0448   CL 103 11/16/2013 0448   CL 102 06/01/2013 1500   CO2 30* 06/26/2014 1324   CO2 27 11/16/2013 0448   GLUCOSE 98 06/26/2014 1324   GLUCOSE 116* 11/16/2013 0448   GLUCOSE 95 06/01/2013 1500   BUN 8.1 06/26/2014 1324   BUN 8 11/16/2013 0448   CREATININE 0.8 06/26/2014 1324   CREATININE 0.86 11/16/2013 0448   CALCIUM 9.7 06/26/2014 1324   CALCIUM 8.9 11/16/2013 0448   PROT 8.2 06/26/2014 1324   PROT 8.2 05/18/2013 1205   ALBUMIN 2.9* 06/26/2014 1324   ALBUMIN 2.8* 05/18/2013 1205   AST 16 06/26/2014 1324   AST 18 05/18/2013 1205   ALT 10 06/26/2014 1324   ALT 16 05/18/2013 1205   ALKPHOS 120 06/26/2014 1324   ALKPHOS 111 05/18/2013 1205   BILITOT 0.40 06/26/2014 1324   BILITOT 0.2* 05/18/2013 1205   GFRNONAA 82* 11/16/2013 0448   GFRAA >90 11/16/2013 0448    I No results found for this basename: SPEP, UPEP,  kappa and lambda light chains    Lab Results  Component Value Date   WBC 7.5 06/26/2014   NEUTROABS 5.0 06/26/2014   HGB 11.3* 06/26/2014   HCT 35.1 06/26/2014   MCV 86.7 06/26/2014   PLT 425* 06/26/2014      Chemistry      Component Value Date/Time   NA 138 06/26/2014 1324   NA 138 11/16/2013 0448   K 3.3* 06/26/2014 1324   K 3.5 11/16/2013 0448   CL 103 11/16/2013 0448   CL 102 06/01/2013 1500  CO2 30* 06/26/2014 1324   CO2 27 11/16/2013 0448   BUN 8.1 06/26/2014 1324   BUN 8 11/16/2013 0448   CREATININE 0.8 06/26/2014 1324   CREATININE 0.86 11/16/2013 0448      Component Value Date/Time   CALCIUM 9.7 06/26/2014 1324   CALCIUM 8.9 11/16/2013 0448   ALKPHOS 120 06/26/2014 1324   ALKPHOS 111 05/18/2013 1205   AST 16 06/26/2014 1324   AST 18 05/18/2013 1205   ALT 10 06/26/2014 1324   ALT 16 05/18/2013 1205   BILITOT 0.40 06/26/2014 1324   BILITOT 0.2* 05/18/2013 1205        Lab Results  Component Value Date   LABCA2 35 05/18/2013    No components found with this basename: [id number]    No results found for this basename: INR,  in the last 168 hours  Urinalysis No results found for this basename: colorurine, appearanceur, labspec, phurine, glucoseu, hgbur, bilirubinur, ketonesur, proteinur, urobilinogen, nitrite, leukocytesur    STUDIES: Ct Chest W Contrast  06/26/2014   CLINICAL DATA:  Breast cancer.  EXAM: CT CHEST, ABDOMEN, AND PELVIS WITH CONTRAST  TECHNIQUE: Multidetector CT imaging of the chest, abdomen and pelvis was performed following the standard protocol during bolus administration of intravenous contrast.  CONTRAST:  16m OMNIPAQUE IOHEXOL 300 MG/ML  SOLN  COMPARISON:  CT scan of June 13, 2013.  FINDINGS: CT CHEST FINDINGS  Large right pleural effusion is now noted with atelectasis of the right lower lobe. 14 x 9 mm pleural-based nodule is seen anteriorly in the right upper lobe consistent with metastatic disease. 4 mm subpleural nodule is noted anteriorly in the left upper lobe also concerning for metastatic disease. Another 5 mm nodule seen along the left cardiac border in the left upper lobe concerning for metastatic disease. Right subclavian Port-A-Cath is noted with tip in SVC. Status post left mastectomy. Surgical clips are noted in left axillary region. Postoperative scarring is noted in this area. No definite evidence of mediastinal mass or adenopathy is noted. Multiple pleural nodules are noted in the inferior portion of the right lung base consistent with metastatic disease.  CT ABDOMEN AND PELVIS FINDINGS: CT ABDOMEN AND PELVIS FINDINGS 42 x 27 mm lesion is noted laterally in right hepatic lobe as well as 27 x 25 mm lesion seen in posterior segment of right hepatic lobe concerning for metastatic lesion. Cholelithiasis is noted. The spleen and pancreas appear normal. Adrenal glands and kidneys appear normal. No hydronephrosis or renal  obstruction is noted. There is no evidence of bowel obstruction. Urinary bladder is decompressed. Uterus and ovaries appear normal. Appendix appears normal. No significant adenopathy is noted.  IMPRESSION: Status post left mastectomy. Large right pleural effusion is noted with multiple associated pleural based nodules consistent with metastatic disease. At least 2 smaller nodules are noted in the left upper lobe concerning for metastatic disease as well.  At least 2 metastatic lesions are noted in the right hepatic lobe.   Electronically Signed   By: JSabino DickM.D.   On: 06/26/2014 15:27   Ct Abdomen Pelvis W Contrast  06/26/2014   CLINICAL DATA:  Breast cancer.  EXAM: CT CHEST, ABDOMEN, AND PELVIS WITH CONTRAST  TECHNIQUE: Multidetector CT imaging of the chest, abdomen and pelvis was performed following the standard protocol during bolus administration of intravenous contrast.  CONTRAST:  1037mOMNIPAQUE IOHEXOL 300 MG/ML  SOLN  COMPARISON:  CT scan of June 13, 2013.  FINDINGS: CT CHEST FINDINGS  Large right pleural effusion  is now noted with atelectasis of the right lower lobe. 14 x 9 mm pleural-based nodule is seen anteriorly in the right upper lobe consistent with metastatic disease. 4 mm subpleural nodule is noted anteriorly in the left upper lobe also concerning for metastatic disease. Another 5 mm nodule seen along the left cardiac border in the left upper lobe concerning for metastatic disease. Right subclavian Port-A-Cath is noted with tip in SVC. Status post left mastectomy. Surgical clips are noted in left axillary region. Postoperative scarring is noted in this area. No definite evidence of mediastinal mass or adenopathy is noted. Multiple pleural nodules are noted in the inferior portion of the right lung base consistent with metastatic disease.  CT ABDOMEN AND PELVIS FINDINGS: CT ABDOMEN AND PELVIS FINDINGS 42 x 27 mm lesion is noted laterally in right hepatic lobe as well as 27 x 25 mm lesion  seen in posterior segment of right hepatic lobe concerning for metastatic lesion. Cholelithiasis is noted. The spleen and pancreas appear normal. Adrenal glands and kidneys appear normal. No hydronephrosis or renal obstruction is noted. There is no evidence of bowel obstruction. Urinary bladder is decompressed. Uterus and ovaries appear normal. Appendix appears normal. No significant adenopathy is noted.  IMPRESSION: Status post left mastectomy. Large right pleural effusion is noted with multiple associated pleural based nodules consistent with metastatic disease. At least 2 smaller nodules are noted in the left upper lobe concerning for metastatic disease as well.  At least 2 metastatic lesions are noted in the right hepatic lobe.   Electronically Signed   By: Sabino Dick M.D.   On: 06/26/2014 15:27    ASSESSMENT: 43 y.o. [place], [place] woman with newly metastatic breast cancer.  (1) Patient was originally diagnosed with clinical T4 N1, stage IIIB, invasive ductal carcinoma grade 3, ER 14%, PR negative, Ki-67 100%, HER-2/neu negative.   (2) Patient underwent neoadjuvant chemotherapy. Adriamycin and Cytoxan began on 05/25/13. She completed 4 cycles with Neulasta support. Taxol carbo began on 8/8. This was discontinued due to neuropathy, she started on Gemzar/Carbo on 8/22- 11/2013.   (2) Anemia of iron deficiency. She has received parenteral iron in the past. This was associated with menorrhagia, the patient has since been receiving Zoladex with great improvement of her hemoglobin and iron studies.   (3) During neoadjuvant treatment, she developed a right upper extremity DVT: She was treated with Lovenox from 06/2013 through 11/2013    (4) Malyn underwent left mastectomy with axillary lymph node dissection. Final pathology did reveal 6.5 cm residual disease 0 of 12 lymph nodes positive for metastatic disease. Tumor was estrogen receptor +14% progesterone receptor negative HER-2/neu negative  (5)  Patient is s/p adjuvant radiation therapy from 02/04/14 through 03/18/2014, Xeloda was added 2-3 weeks into treatment.  (6) CT chest, abdomen, and pelvis demonstrated a large right pleural effusion and right hepatic lobe metastases.    PLAN:   I discussed the scans with Erika [name] in detail.  She has not had any significant shortness of breath, and tells me that just recently she developed the pain in her right breast.  She also admits that she does down play things from time to time.  She has been fatigued, and she has lost weight.  She has also been walking two miles per day which I encouraged her to continue.     I discussed the following plan with Erika [name]:  1--Thoracentesis with cytology   2--MRI of her brain to evaluate for metastases   3--f/u with Dr.  Magrinat in one week to discuss chemotherapy options.    Kawanna is in agreement with the above plan.  She will return in one week for evaluation and discussion with Dr. Jana Hakim.  I have requested the scheduling of the above.    I spent 25 minutes counseling the patient face to face.  The total time spent in the appointment was 30 minutes.  Erika [name], [place] [phone] 07/01/2014 9:56 AM

## 2014-06-27 NOTE — Telephone Encounter (Signed)
per pof to sch appt-sent to inj room to have inj-adv pt MRI & [place] [facility] will sch-adv pt to expect call-per pof to CX appt for 7/17-gave pt updated sch

## 2014-06-28 ENCOUNTER — Ambulatory Visit: Payer: PRIVATE HEALTH INSURANCE

## 2014-06-28 ENCOUNTER — Other Ambulatory Visit: Payer: PRIVATE HEALTH INSURANCE

## 2014-06-28 ENCOUNTER — Ambulatory Visit: Payer: PRIVATE HEALTH INSURANCE | Admitting: Adult Health

## 2014-07-01 ENCOUNTER — Other Ambulatory Visit: Payer: Self-pay | Admitting: *Deleted

## 2014-07-02 ENCOUNTER — Ambulatory Visit: Payer: No Typology Code available for payment source | Admitting: Physical Therapy

## 2014-07-03 ENCOUNTER — Ambulatory Visit (HOSPITAL_COMMUNITY)
Admission: RE | Admit: 2014-07-03 | Discharge: 2014-07-03 | Disposition: A | Discharge status: Home / Self Care | Payer: PRIVATE HEALTH INSURANCE | Source: Ambulatory Visit | Attending: Adult Health | Admitting: Adult Health

## 2014-07-03 ENCOUNTER — Ambulatory Visit (HOSPITAL_COMMUNITY)
Admission: RE | Admit: 2014-07-03 | Discharge: 2014-07-03 | Disposition: A | Discharge status: Home / Self Care | Payer: PRIVATE HEALTH INSURANCE | Source: Ambulatory Visit | Attending: Radiology | Admitting: Radiology

## 2014-07-03 ENCOUNTER — Telehealth: Payer: Self-pay | Admitting: Oncology

## 2014-07-03 DIAGNOSIS — J90 Pleural effusion, not elsewhere classified: Secondary | ICD-10-CM | POA: Diagnosis not present

## 2014-07-03 DIAGNOSIS — J9819 Other pulmonary collapse: Secondary | ICD-10-CM | POA: Diagnosis not present

## 2014-07-03 DIAGNOSIS — C50919 Malignant neoplasm of unspecified site of unspecified female breast: Secondary | ICD-10-CM | POA: Diagnosis not present

## 2014-07-03 DIAGNOSIS — C50212 Malignant neoplasm of upper-inner quadrant of left female breast: Secondary | ICD-10-CM

## 2014-07-03 LAB — BODY FLUID CELL COUNT WITH DIFFERENTIAL
Eos, Fluid: 6 %
Lymphs, Fluid: 27 %
Monocyte-Macrophage-Serous Fluid: 15 % — ABNORMAL LOW (ref 50–90)
NEUTROPHIL FLUID: 52 % — AB (ref 0–25)
Total Nucleated Cell Count, Fluid: 1018 cu mm — ABNORMAL HIGH (ref 0–1000)

## 2014-07-03 LAB — PROTEIN, BODY FLUID: Total protein, fluid: 5.8 g/dL

## 2014-07-03 LAB — LACTATE DEHYDROGENASE, PLEURAL OR PERITONEAL FLUID: LD FL: 364 U/L — AB (ref 3–23)

## 2014-07-03 LAB — GLUCOSE, SEROUS FLUID: GLUCOSE FL: 92 mg/dL

## 2014-07-03 NOTE — Telephone Encounter (Signed)
s.w. pt and advised on July 30 MRI first available

## 2014-07-03 NOTE — Procedures (Signed)
Successful US guided right thoracentesis. Yielded 1.2L of blood tinged fluid. Pt tolerated procedure well. No immediate complications.  Specimen was sent for labs. CXR ordered.  Ascencion Dike PA-C 07/03/2014 1:47 PM

## 2014-07-04 ENCOUNTER — Ambulatory Visit: Payer: No Typology Code available for payment source | Admitting: Physical Therapy

## 2014-07-05 ENCOUNTER — Ambulatory Visit (HOSPITAL_BASED_OUTPATIENT_CLINIC_OR_DEPARTMENT_OTHER): Payer: PRIVATE HEALTH INSURANCE | Admitting: Oncology

## 2014-07-05 VITALS — BP 119/75 | HR 87 | Temp 98.1°F | Resp 20 | Ht 65.5 in | Wt 195.6 lb

## 2014-07-05 DIAGNOSIS — C50912 Malignant neoplasm of unspecified site of left female breast: Secondary | ICD-10-CM

## 2014-07-05 DIAGNOSIS — C50212 Malignant neoplasm of upper-inner quadrant of left female breast: Secondary | ICD-10-CM

## 2014-07-05 DIAGNOSIS — C50219 Malignant neoplasm of upper-inner quadrant of unspecified female breast: Secondary | ICD-10-CM

## 2014-07-05 DIAGNOSIS — C773 Secondary and unspecified malignant neoplasm of axilla and upper limb lymph nodes: Secondary | ICD-10-CM

## 2014-07-05 DIAGNOSIS — Z171 Estrogen receptor negative status [ER-]: Secondary | ICD-10-CM

## 2014-07-05 DIAGNOSIS — Z86718 Personal history of other venous thrombosis and embolism: Secondary | ICD-10-CM

## 2014-07-05 DIAGNOSIS — C787 Secondary malignant neoplasm of liver and intrahepatic bile duct: Secondary | ICD-10-CM

## 2014-07-05 DIAGNOSIS — R911 Solitary pulmonary nodule: Secondary | ICD-10-CM

## 2014-07-05 DIAGNOSIS — J90 Pleural effusion, not elsewhere classified: Secondary | ICD-10-CM

## 2014-07-05 MED ORDER — DEXAMETHASONE 4 MG PO TABS: 8.0000 mg | ORAL_TABLET | Freq: Two times a day (BID) | ORAL | Status: DC

## 2014-07-05 MED ORDER — ONDANSETRON HCL 8 MG PO TABS: 8.0000 mg | ORAL_TABLET | Freq: Two times a day (BID) | ORAL | Status: DC

## 2014-07-05 MED ORDER — PROCHLORPERAZINE MALEATE 10 MG PO TABS: 10.0000 mg | ORAL_TABLET | Freq: Four times a day (QID) | ORAL | Status: DC | PRN

## 2014-07-05 MED ORDER — LIDOCAINE-PRILOCAINE 2.5-2.5 % EX CREA: 1.0000 "application " | TOPICAL_CREAM | CUTANEOUS | Status: DC | PRN

## 2014-07-05 NOTE — Progress Notes (Signed)
ID: Erika [name] OB: [birth year]  MR#: [id number]  CSN#:[id number]  PCP: [name], MD GYN:   SU: Dr. [name] OTHER MD:  CHIEF COMPLAINT:  Newly metastatic breast cancer CURRENT TREATMENT: Chemotherapy   BREAST CANCER HISTORY: From Dr. [name] original intake note:  "Patient developed a left sided breast mass show up about 6 months prior to diagnosis. 2 months prior this began getting larger rapidly. She was seen in an urgent care where this mass was thought to be an abscess. Apparently there was an attempt to excise or drain this that was not successful and she was referred to Dr. [name] for evaluation. She underwent punch biopsy on 04/23/13 of her skin and the mass. She was then also sent for mm/us. The biopsy shows invasive high grade carcinoma that appears to be breast. It is ER 14%, PR negative, HER-2/neu negative with a Ki-67 that approaches 100%. She has also undergone mm with at least 10 cm upper inner left breast tumor with mild overyling skin thickening. There is also a 3x10 mm cluster of calcs near there also. There is indeterminate 7 mm level 1 left axillary node, she underwent a lymph node biopsy that was positive for disease. No mm evidence of right breast malignancy. An MRI of the breasts was done on 05/22/13 and showed a 14 cm mass in the left breast protruding from the skin and also a 2.5 cm level II axillary node."  Her subsequent history is as detailed below  INTERVAL HISTORY:  Erika [name] returns today for followup of her breast cancer, accompanied by her significant other [name]. She had been advised that she could have the port removed, but she requested some restaging scans prior to that procedure. At the last visit here the patient was advised that the CT scans of the chest abdomen and pelvis obtained 06/26/2014 for restaging purposes showed a large right pleural effusion with pleural based nodules on the right; 2 small pulmonary nodules on the left, and 2  liver nodules also consistent with metastatic disease. Since that visit the patient underwent right thoracentesis, with the cytology (07/03/2014, NZB [id number]) showing malignant cells consistent with the patient's known primary breast cancer. There was insufficient tissue for a prognostic panel. The patient is establishing herself on my service today  REVIEW OF SYSTEMS:  The patient reports no pulmonary symptoms and she did not feel her breathing was any better, or worse, after the thoracentesis. She specifically denies cough, pleurisy, hemoptysis, or shortness of breath. She was having some right chest wall pain but that is not a problem currently. She has had some mild headaches, but these are not persistent or intense. A detailed review of systems today was otherwise noncontributory  PAST MEDICAL HISTORY: Past Medical History  Diagnosis Date  . Breast cancer     Invasive High Grade Carcinoma  . Headache(784.0)     Hx: of Migraines  . Status post chemotherapy     Adriamycin and Cytoxan  . Anxiety     ANXIOUS ABOUT HAVEING BIG SURGERY - MASTECTOMY  . 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 DVT (deep venous thrombosis)     RT ARM-PT ON LOVENOX  . Hx of iron deficiency anemia 11/26/13  . S/P radiation therapy 02-04-14 to 03-18-14                               1) Left Chest Wall / 50 Gy  in 25 fractions/ 2) Left Supraclavicular fossa/ 46 Gy in 23 fractions / 3) Left Posterior Axillary boost / 8.533 Gy in 23 fractions/ 4) Left Chest Wall Scar boost / 10 Gy in 5 fractions    PAST SURGICAL HISTORY: Past Surgical History  Procedure Laterality Date  . Tubal ligation    . Portacath placement Right 05/22/2013    Procedure: INSERTION PORT-A-CATH;  Surgeon: [name], MD;  Location: [facility];  Service: General;  Laterality: Right;  . Left breast needle core biopsy Left 05/21/13  . Left breast needle core  biopsy Left 06/22/13  . Mastectomy modified radical Left 11/15/2013    Procedure: LEFT  MASTECTOMY MODIFIED RADICAL;  Surgeon: [name], MD;  Location: [facility];  Service: General;  Laterality: Left;    FAMILY HISTORY Family History  Problem Relation Age of Onset  . Hypertension Mother   . Diabetes Father 16  . Stomach cancer Maternal Aunt   . Hypertension Maternal Grandmother   . Hypertension Maternal Grandfather   . Diabetes Paternal Grandmother   . Diabetes Paternal Grandfather   . Diabetes Paternal Uncle   . Diabetes Paternal Uncle   . Diabetes Paternal Uncle   The patient's mother is alive, age 35. The patient's father died from complications of diabetes at the age of 14. The patient has 2 brothers, one sister. There is no history of breast or ovarian cancer in the family to her knowledge .the patient has not undergone genetic testing   GYNECOLOGIC HISTORY:  Menarche age 63, first live birth age 60. The patient is GX P3. The patient has been on goserelin since 06/13/2013, given monthly  SOCIAL HISTORY:  The patient has worked at as a Furniture conservator/restorer and a Retail buyer. She is currently applying for disability. At home she lives with her youngest son, [name], 55; with the patient's brother Ishia Tenorio; and with the patient's fiance [name].   ADVANCED DIRECTIVES:   HEALTH MAINTENANCE: History  Substance Use Topics  . Smoking status: Never Smoker   . Smokeless tobacco: Never Used  . Alcohol Use: No      Mammogram: 06/22/2013 Colonoscopy: Bone Density Scan:  Pap Smear:  Eye Exam:  Vitamin D Level:   Lipid Panel:    Allergies  Allergen Reactions  . Doxycycline Nausea And Vomiting    Current Outpatient Prescriptions  Medication Sig Dispense Refill  . cyclobenzaprine (FLEXERIL) 5 MG tablet TAKE 1 TABLET BY MOUTH THREE TIMES A DAY FOR MUSCLE SPASMS  30 tablet  1  . fentaNYL (DURAGESIC - DOSED MCG/HR) 25 MCG/HR patch Place 1 patch (25 mcg total) onto the skin every 3 (three) days.  10 patch  0  . fentaNYL (DURAGESIC - DOSED MCG/HR) 75 MCG/HR  Place 1 patch (75 mcg total) onto the skin every 3 (three) days.  10 patch  0  . gabapentin (NEURONTIN) 300 MG capsule Take 1 capsule (300 mg total) by mouth 3 (three) times daily.  90 capsule  3  . ibuprofen (ADVIL,MOTRIN) 200 MG tablet Take 600-800 mg by mouth every 4 (four) hours as needed for mild pain or moderate pain.       Marland Kitchen lidocaine-prilocaine (EMLA) cream Apply 1 application topically every 14 (fourteen) days. Every other Friday prior to chemo      . LORazepam (ATIVAN) 0.5 MG tablet Take 0.5 mg by mouth every 6 (six) hours as needed for anxiety.      . non-metallic deodorant Jethro Poling) MISC Apply 1 application topically daily as needed.      Marland Kitchen  ondansetron (ZOFRAN) 8 MG tablet Take 1 tablet (8 mg total) by mouth every 8 (eight) hours as needed for nausea or vomiting.  30 tablet  0  . oxyCODONE (OXY IR/ROXICODONE) 5 MG immediate release tablet Take 1 tablet (5 mg total) by mouth every 12 (twelve) hours as needed for severe pain or breakthrough pain.  60 tablet  0  . potassium chloride SA (K-DUR,KLOR-CON) 20 MEQ tablet Take 1 tablet (20 mEq total) by mouth daily.  7 tablet  0  . PRESCRIPTION MEDICATION PT REPORTS RECEIVING A SHOT ONCE A MONTH AT CANCER CENTER TO STOP HER MENSTRUAL PERIODS - I SEE DOCUMENTATION IN OFFICE NOTES FROM CANCER CENTER THAT PT WAS GETTING ZOLADEX SQ EVERY 28 DAYS.      Marland Kitchen silver sulfADIAZINE (SILVADENE) 1 % cream Apply 1 application topically 2 (two) times daily.      . traMADol (ULTRAM) 50 MG tablet Take 1-2 tablets (50-100 mg total) by mouth every 6 (six) hours as needed.  30 tablet  1  . UNABLE TO FIND Rx: L8000- Post Surgical Bra (Quantity: 6) T6226- Non Silicone Breast prosthesis (Quantity: 1) Dx: 174.9; Left mastectomy  1 each  0  . zolpidem (AMBIEN) 5 MG tablet Take 1 tablet (5 mg total) by mouth at bedtime as needed for sleep.  30 tablet  0   No current facility-administered medications for this visit.   Facility-Administered Medications Ordered in Other Visits   Medication Dose Route Frequency Provider Last Rate Last Dose  . topical emolient (BIAFINE) emulsion   Topical Daily [name], MD        OBJECTIVE: middle-aged African American woman who appears stated age 43 Vitals:   07/05/14 1621  BP: 119/75  Pulse: 87  Temp: 98.1 F (36.7 C)  Resp: 20     Body mass index is 32.04 kg/(m^2).      ECOG FS:1 - Symptomatic but completely ambulatory  Sclerae unicteric, pupils equal and reactive Oropharynx clear and moist No cervical or supraclavicular adenopathy Lungs no rales or rhonchi Heart regular rate and rhythm Abd soft, nontender, positive bowel sounds MSK no focal spinal tenderness, no upper extremity lymphedema Neuro: nonfocal, well oriented, appropriate affect Breasts: Deferred   LAB RESULTS:  CMP     Component Value Date/Time   NA 138 06/26/2014 1324   NA 138 11/16/2013 0448   K 3.3* 06/26/2014 1324   K 3.5 11/16/2013 0448   CL 103 11/16/2013 0448   CL 102 06/01/2013 1500   CO2 30* 06/26/2014 1324   CO2 27 11/16/2013 0448   GLUCOSE 98 06/26/2014 1324   GLUCOSE 116* 11/16/2013 0448   GLUCOSE 95 06/01/2013 1500   BUN 8.1 06/26/2014 1324   BUN 8 11/16/2013 0448   CREATININE 0.8 06/26/2014 1324   CREATININE 0.86 11/16/2013 0448   CALCIUM 9.7 06/26/2014 1324   CALCIUM 8.9 11/16/2013 0448   PROT 8.2 06/26/2014 1324   PROT 8.2 05/18/2013 1205   ALBUMIN 2.9* 06/26/2014 1324   ALBUMIN 2.8* 05/18/2013 1205   AST 16 06/26/2014 1324   AST 18 05/18/2013 1205   ALT 10 06/26/2014 1324   ALT 16 05/18/2013 1205   ALKPHOS 120 06/26/2014 1324   ALKPHOS 111 05/18/2013 1205   BILITOT 0.40 06/26/2014 1324   BILITOT 0.2* 05/18/2013 1205   GFRNONAA 82* 11/16/2013 0448   GFRAA >90 11/16/2013 0448    I No results found for this basename: SPEP,  UPEP,   kappa and lambda light chains  Lab Results  Component Value Date   WBC 7.5 06/26/2014   NEUTROABS 5.0 06/26/2014   HGB 11.3* 06/26/2014   HCT 35.1 06/26/2014   MCV 86.7 06/26/2014   PLT 425* 06/26/2014       Chemistry      Component Value Date/Time   NA 138 06/26/2014 1324   NA 138 11/16/2013 0448   K 3.3* 06/26/2014 1324   K 3.5 11/16/2013 0448   CL 103 11/16/2013 0448   CL 102 06/01/2013 1500   CO2 30* 06/26/2014 1324   CO2 27 11/16/2013 0448   BUN 8.1 06/26/2014 1324   BUN 8 11/16/2013 0448   CREATININE 0.8 06/26/2014 1324   CREATININE 0.86 11/16/2013 0448      Component Value Date/Time   CALCIUM 9.7 06/26/2014 1324   CALCIUM 8.9 11/16/2013 0448   ALKPHOS 120 06/26/2014 1324   ALKPHOS 111 05/18/2013 1205   AST 16 06/26/2014 1324   AST 18 05/18/2013 1205   ALT 10 06/26/2014 1324   ALT 16 05/18/2013 1205   BILITOT 0.40 06/26/2014 1324   BILITOT 0.2* 05/18/2013 1205       Lab Results  Component Value Date   LABCA2 35 05/18/2013    No components found with this basename: BOFBP102    No results found for this basename: INR,  in the last 168 hours  Urinalysis No results found for this basename: colorurine,  appearanceur,  labspec,  phurine,  glucoseu,  hgbur,  bilirubinur,  ketonesur,  proteinur,  urobilinogen,  nitrite,  leukocytesur    STUDIES: Ct Chest W Contrast  06/26/2014   CLINICAL DATA:  Breast cancer.  EXAM: CT CHEST, ABDOMEN, AND PELVIS WITH CONTRAST  TECHNIQUE: Multidetector CT imaging of the chest, abdomen and pelvis was performed following the standard protocol during bolus administration of intravenous contrast.  CONTRAST:  128m OMNIPAQUE IOHEXOL 300 MG/ML  SOLN  COMPARISON:  CT scan of June 13, 2013.  FINDINGS: CT CHEST FINDINGS  Large right pleural effusion is now noted with atelectasis of the right lower lobe. 14 x 9 mm pleural-based nodule is seen anteriorly in the right upper lobe consistent with metastatic disease. 4 mm subpleural nodule is noted anteriorly in the left upper lobe also concerning for metastatic disease. Another 5 mm nodule seen along the left cardiac border in the left upper lobe concerning for metastatic disease. Right subclavian Port-A-Cath is noted with tip in SVC.  Status post left mastectomy. Surgical clips are noted in left axillary region. Postoperative scarring is noted in this area. No definite evidence of mediastinal mass or adenopathy is noted. Multiple pleural nodules are noted in the inferior portion of the right lung base consistent with metastatic disease.  CT ABDOMEN AND PELVIS FINDINGS: CT ABDOMEN AND PELVIS FINDINGS 42 x 27 mm lesion is noted laterally in right hepatic lobe as well as 27 x 25 mm lesion seen in posterior segment of right hepatic lobe concerning for metastatic lesion. Cholelithiasis is noted. The spleen and pancreas appear normal. Adrenal glands and kidneys appear normal. No hydronephrosis or renal obstruction is noted. There is no evidence of bowel obstruction. Urinary bladder is decompressed. Uterus and ovaries appear normal. Appendix appears normal. No significant adenopathy is noted.  IMPRESSION: Status post left mastectomy. Large right pleural effusion is noted with multiple associated pleural based nodules consistent with metastatic disease. At least 2 smaller nodules are noted in the left upper lobe concerning for metastatic disease as well.  At least 2 metastatic lesions are  noted in the right hepatic lobe.   Electronically Signed   By: Sabino Dick M.D.   On: 06/26/2014 15:27   Ct Abdomen Pelvis W Contrast  06/26/2014   CLINICAL DATA:  Breast cancer.  EXAM: CT CHEST, ABDOMEN, AND PELVIS WITH CONTRAST  TECHNIQUE: Multidetector CT imaging of the chest, abdomen and pelvis was performed following the standard protocol during bolus administration of intravenous contrast.  CONTRAST:  159m OMNIPAQUE IOHEXOL 300 MG/ML  SOLN  COMPARISON:  CT scan of June 13, 2013.  FINDINGS: CT CHEST FINDINGS  Large right pleural effusion is now noted with atelectasis of the right lower lobe. 14 x 9 mm pleural-based nodule is seen anteriorly in the right upper lobe consistent with metastatic disease. 4 mm subpleural nodule is noted anteriorly in the left upper  lobe also concerning for metastatic disease. Another 5 mm nodule seen along the left cardiac border in the left upper lobe concerning for metastatic disease. Right subclavian Port-A-Cath is noted with tip in SVC. Status post left mastectomy. Surgical clips are noted in left axillary region. Postoperative scarring is noted in this area. No definite evidence of mediastinal mass or adenopathy is noted. Multiple pleural nodules are noted in the inferior portion of the right lung base consistent with metastatic disease.  CT ABDOMEN AND PELVIS FINDINGS: CT ABDOMEN AND PELVIS FINDINGS 42 x 27 mm lesion is noted laterally in right hepatic lobe as well as 27 x 25 mm lesion seen in posterior segment of right hepatic lobe concerning for metastatic lesion. Cholelithiasis is noted. The spleen and pancreas appear normal. Adrenal glands and kidneys appear normal. No hydronephrosis or renal obstruction is noted. There is no evidence of bowel obstruction. Urinary bladder is decompressed. Uterus and ovaries appear normal. Appendix appears normal. No significant adenopathy is noted.  IMPRESSION: Status post left mastectomy. Large right pleural effusion is noted with multiple associated pleural based nodules consistent with metastatic disease. At least 2 smaller nodules are noted in the left upper lobe concerning for metastatic disease as well.  At least 2 metastatic lesions are noted in the right hepatic lobe.   Electronically Signed   By: JSabino DickM.D.   On: 06/26/2014 15:27    ASSESSMENT: 43y.o.  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  (0) genetic testing of the ATM, BARD1, BRCA1, BRCA2, BRIP1, CDH1, CHEK2, EPCAM, FANCC, MLH1, MSH2, MSH6, NBN, PALB2, PMS2, PTEN, RAD51C, RAD51D, STK11, TP53, and XRCC2 genes showed no deleterious mutations  (1) status post left breast biopsy 04/23/2013 for a clinical T4 N1, stage IIIB invasive ductal carcinoma, grade 3,  estrogen receptor 14%  positive with moderate staining intensity, progesterone receptor negative,  with an MIB-1 of  100% and HER-2/neu  nonamplified  (2) biopsy of a left axillary lymph node [id number] positive  (3) started monthly goserelin July of 02/01/2013  (4)  received  neoadjuvant chemotherapy with doxorubicin and cyclophosphamide in dose dense fashion x4, followed by carboplatin and paclitaxel x1, discontinued due to neuropathy, followed by carboplatin and gemcitabine, both given day 1 of each 14 day cycle x6, completed 10/12/2013 .   (5) right upper extremity DVT documented 06/29/2013,  treated with Lovenox from  July  through  December 2014    (6) status post left modified radical mastectomy 11/15/2013 for a residual pT3 pN0 invasive ductal carcinoma, grade 3, with repeat prognostic panel triple negative. All 12 axillary lymph nodes were clear. Margins were negative   (5) completed adjuvant  radiation therapy 03/18/2014,  with capecitabine added 2-3 weeks into treatment.  METASTATIC DISEASE  (6) CT scans of the chest, abdomen, and pelvis 06/26/2014 showed a large right pleural effusion  with pleural studding, 2 left upper lobe nodules, and two  liver lesions. Cytology from right thoracentesis 07/03/2014 showed malignant cells consistent with the patient's known primary  (7) to start eribulin 07/09/2014, given days 1 and 8 of each 21 day cycle  (8) referral to [facility] initiated 07/08/2014  PLAN:  I spent approximately one hour with the patient and her significant other Mr. Berline Lopes going over her situation. She understands stage IV breast cancer is not curable with her current knowledge base. Accordingly the goal of treatment is control. If we can't obtain control with one drug, there is no need to add others, a secondary goal being to maintain as good a quality of life as possible while undergoing treatment.   Although her initial biopsy showed moderate estrogen receptor positivity, the definitive mastectomy  showed her tumor to be triple negative. Accordingly chemotherapy is her only option for systemic disease. We're going to continue the goserelin, partly because of the initial estrogen receptor positivity, but also because the patient has a significant history of menorrhagia and iron deficiency anemia. All of that resolved with the goserelin shots  With regard to chemotherapy we discussed eribulin  and the patient will receive her first dose July 28. Treatment will be given days 1 and 8 of each 21 day cycle. The plan is to go for 3 cycles and then restage. The patient has a good understanding of the possible toxicities, side effects and complications of this form of therapy. I also went over her the appropriate antinausea medications and the [facility] were sent to her pharmacy.  Finally I am also referring the patient to [facility] for a discussion of available studies and enrollment in [place], so that if after the initial 3 cycles of eribulin there is disease progression we may be able to consider experimental as well as more standard options.  Erika [name] has a good understanding of the overall plan. She agrees with that. She knows the goal of treatment in her case is control. She will call with any problems that may develop before her next visit here.    [name], MD  07/05/2014 4:46 PM

## 2014-07-06 DIAGNOSIS — C50212 Malignant neoplasm of upper-inner quadrant of left female breast: Secondary | ICD-10-CM | POA: Insufficient documentation

## 2014-07-09 ENCOUNTER — Ambulatory Visit: Payer: No Typology Code available for payment source | Admitting: Physical Therapy

## 2014-07-09 ENCOUNTER — Telehealth: Payer: Self-pay

## 2014-07-09 ENCOUNTER — Telehealth: Payer: Self-pay | Admitting: Oncology

## 2014-07-09 NOTE — Telephone Encounter (Signed)
s/w pt re appt for tomorrow 7/29 and 12:45pm. pt aware i s/w her dtr re a 1pm time but time has been changed to 12:45pm. pt will arrive early for registration and get new schedule when she comes in. not able to get tx scheduled for 7/28. per desk nurse 7/29 and 8/5 ok. inj appts scheduled in line with exisiting inj appt already on schedule for Fridays vs thursdays. referral inbasket message sent to HIM re referral to Dr. Albertina Parr at [facility].

## 2014-07-09 NOTE — Telephone Encounter (Signed)
Returned pt call - let her know the halaven could be done on Wednesday this cycle and would be moved to Tuesday next cycle.  Pt voiced understanding.

## 2014-07-10 ENCOUNTER — Ambulatory Visit (HOSPITAL_BASED_OUTPATIENT_CLINIC_OR_DEPARTMENT_OTHER): Payer: PRIVATE HEALTH INSURANCE

## 2014-07-10 ENCOUNTER — Telehealth: Payer: Self-pay | Admitting: Oncology

## 2014-07-10 ENCOUNTER — Other Ambulatory Visit (HOSPITAL_BASED_OUTPATIENT_CLINIC_OR_DEPARTMENT_OTHER): Payer: PRIVATE HEALTH INSURANCE

## 2014-07-10 ENCOUNTER — Other Ambulatory Visit: Payer: Self-pay | Admitting: Adult Health

## 2014-07-10 VITALS — BP 107/71 | HR 107 | Temp 98.7°F | Resp 16

## 2014-07-10 DIAGNOSIS — E876 Hypokalemia: Secondary | ICD-10-CM

## 2014-07-10 DIAGNOSIS — C50219 Malignant neoplasm of upper-inner quadrant of unspecified female breast: Secondary | ICD-10-CM

## 2014-07-10 DIAGNOSIS — C50212 Malignant neoplasm of upper-inner quadrant of left female breast: Secondary | ICD-10-CM

## 2014-07-10 DIAGNOSIS — Z5111 Encounter for antineoplastic chemotherapy: Secondary | ICD-10-CM

## 2014-07-10 LAB — CBC WITH DIFFERENTIAL/PLATELET
BASO%: 0.7 % (ref 0.0–2.0)
Basophils Absolute: 0.1 10*3/uL (ref 0.0–0.1)
EOS ABS: 0.2 10*3/uL (ref 0.0–0.5)
EOS%: 2.1 % (ref 0.0–7.0)
HCT: 35.9 % (ref 34.8–46.6)
HGB: 11.5 g/dL — ABNORMAL LOW (ref 11.6–15.9)
LYMPH%: 16.2 % (ref 14.0–49.7)
MCH: 27.1 pg (ref 25.1–34.0)
MCHC: 32 g/dL (ref 31.5–36.0)
MCV: 84.7 fL (ref 79.5–101.0)
MONO#: 0.4 10*3/uL (ref 0.1–0.9)
MONO%: 4.8 % (ref 0.0–14.0)
NEUT#: 6.3 10*3/uL (ref 1.5–6.5)
NEUT%: 76.2 % (ref 38.4–76.8)
Platelets: 527 10*3/uL — ABNORMAL HIGH (ref 145–400)
RBC: 4.24 10*6/uL (ref 3.70–5.45)
RDW: 14.7 % — AB (ref 11.2–14.5)
WBC: 8.3 10*3/uL (ref 3.9–10.3)
lymph#: 1.3 10*3/uL (ref 0.9–3.3)

## 2014-07-10 LAB — COMPREHENSIVE METABOLIC PANEL (CC13)
ALT: 12 U/L (ref 0–55)
AST: 24 U/L (ref 5–34)
Albumin: 2.8 g/dL — ABNORMAL LOW (ref 3.5–5.0)
Alkaline Phosphatase: 126 U/L (ref 40–150)
Anion Gap: 9 meq/L (ref 3–11)
BUN: 7.6 mg/dL (ref 7.0–26.0)
CO2: 34 meq/L — ABNORMAL HIGH (ref 22–29)
Calcium: 10 mg/dL (ref 8.4–10.4)
Chloride: 97 meq/L — ABNORMAL LOW (ref 98–109)
Creatinine: 0.8 mg/dL (ref 0.6–1.1)
Glucose: 107 mg/dL (ref 70–140)
Potassium: 2.7 meq/L — CL (ref 3.5–5.1)
Sodium: 140 meq/L (ref 136–145)
Total Bilirubin: 0.37 mg/dL (ref 0.20–1.20)
Total Protein: 8.6 g/dL — ABNORMAL HIGH (ref 6.4–8.3)

## 2014-07-10 MED ORDER — SODIUM CHLORIDE 0.9 % IV SOLN
Freq: Once | INTRAVENOUS | Status: AC
  Administered 2014-07-10: 15:00:00 via INTRAVENOUS

## 2014-07-10 MED ORDER — DEXAMETHASONE SODIUM PHOSPHATE 10 MG/ML IJ SOLN
INTRAMUSCULAR | Status: AC
  Filled 2014-07-10: qty 1

## 2014-07-10 MED ORDER — POTASSIUM CHLORIDE 10 MEQ/100ML IV SOLN
10.0000 meq | Freq: Once | INTRAVENOUS | Status: DC
  Filled 2014-07-10: qty 100

## 2014-07-10 MED ORDER — SODIUM CHLORIDE 0.9 % IV SOLN
Freq: Once | INTRAVENOUS | Status: AC
  Administered 2014-07-10: 16:00:00 via INTRAVENOUS
  Filled 2014-07-10: qty 100

## 2014-07-10 MED ORDER — DEXAMETHASONE SODIUM PHOSPHATE 10 MG/ML IJ SOLN
10.0000 mg | Freq: Once | INTRAMUSCULAR | Status: AC
  Administered 2014-07-10: 10 mg via INTRAVENOUS

## 2014-07-10 MED ORDER — ONDANSETRON 8 MG/50ML IVPB (CHCC)
8.0000 mg | Freq: Once | INTRAVENOUS | Status: AC
  Administered 2014-07-10: 8 mg via INTRAVENOUS

## 2014-07-10 MED ORDER — HEPARIN SOD (PORK) LOCK FLUSH 100 UNIT/ML IV SOLN
500.0000 [IU] | Freq: Once | INTRAVENOUS | Status: AC | PRN
  Administered 2014-07-10: 500 [IU]
  Filled 2014-07-10: qty 5

## 2014-07-10 MED ORDER — SODIUM CHLORIDE 0.9 % IJ SOLN
10.0000 mL | INTRAMUSCULAR | Status: DC | PRN
  Administered 2014-07-10: 10 mL
  Filled 2014-07-10: qty 10

## 2014-07-10 MED ORDER — SODIUM CHLORIDE 0.9 % IV SOLN
1.4000 mg/m2 | Freq: Once | INTRAVENOUS | Status: AC
  Administered 2014-07-10: 2.85 mg via INTRAVENOUS
  Filled 2014-07-10: qty 5.7

## 2014-07-10 NOTE — Patient Instructions (Addendum)
[facility] Discharge Instructions for Patients Receiving Chemotherapy  Today you received the following chemotherapy agents halaven  To help prevent nausea and vomiting after your treatment, we encourage you to take your nausea medication as directed   If you develop nausea and vomiting that is not controlled by your nausea medication, call the clinic.   BELOW ARE SYMPTOMS THAT SHOULD BE REPORTED IMMEDIATELY:  *FEVER GREATER THAN 100.5 F  *CHILLS WITH OR WITHOUT FEVER  NAUSEA AND VOMITING THAT IS NOT CONTROLLED WITH YOUR NAUSEA MEDICATION  *UNUSUAL SHORTNESS OF BREATH  *UNUSUAL BRUISING OR BLEEDING  TENDERNESS IN MOUTH AND THROAT WITH OR WITHOUT PRESENCE OF ULCERS  *URINARY PROBLEMS  *BOWEL PROBLEMS  UNUSUAL RASH Items with * indicate a potential emergency and should be followed up as soon as possible.  Feel free to call the clinic you have any questions or concerns. The clinic phone number is [phone].  Eribulin solution for injection What is this medicine? ERIBULIN is a chemotherapy drug. It is used to treat breast cancer. This medicine may be used for other purposes; ask your health care provider or pharmacist if you have questions. COMMON BRAND NAME(S): Halaven What should I tell my health care provider before I take this medicine? They need to know if you have any of these conditions: -heart disease -kidney disease -liver disease -low blood counts, like low white cell, platelet, or red cell counts -an unusual or allergic reaction to eribulin, other medicines, foods, dyes, or preservatives -pregnant or trying to get pregnant -breast-feeding How should I use this medicine? This medicine is for infusion into a vein. It is given by a health care professional in a hospital or clinic setting. Talk to your pediatrician regarding the use of this medicine in children. Special care may be needed. Overdosage: If you think you've taken too much of this  medicine contact a poison control center or emergency room at once. Overdosage: If you think you have taken too much of this medicine contact a poison control center or emergency room at once. NOTE: This medicine is only for you. Do not share this medicine with others. What if I miss a dose? It is important not to miss your dose. Call your doctor or health care professional if you are unable to keep an appointment. What may interact with this medicine? Do not take this medicine with any of the following medications: -amiodarone -astemizole -arsenic trioxide -bepridil -bretylium -chloroquine -chlorpromazine -cisapride -clarithromycin -dextromethorphan, quinidine -disopyramide -dofetilide -droperidol -dronedarone -erythromycin -grepafloxacin -halofantrine -haloperidol -ibutilide -levomethadyl -mesoridazine -methadone -pentamidine -procainamide -quinidine -pimozide -posaconazole -probucol -propafenone -saquinavir -sotalol -sparfloxacin -terfenadine -thioridazine -troleandomycin -ziprasidone This list may not describe all possible interactions. Give your health care provider a list of all the medicines, herbs, non-prescription drugs, or dietary supplements you use. Also tell them if you smoke, drink alcohol, or use illegal drugs. Some items may interact with your medicine. What should I watch for while using this medicine? Your condition will be monitored carefully while you are receiving this medicine. This drug may make you feel generally unwell. This is not uncommon, as chemotherapy can affect healthy cells as well as cancer cells. Report any side effects. Continue your course of treatment even though you feel ill unless your doctor tells you to stop. Call your doctor or health care professional for advice if you get a fever, chills or sore throat, or other symptoms of a cold or flu. Do not treat yourself. This drug decreases your body's ability to   fight infections. Try  to avoid being around people who are sick. This medicine may increase your risk to bruise or bleed. Call your doctor or health care professional if you notice any unusual bleeding. Be careful brushing and flossing your teeth or using a toothpick because you may get an infection or bleed more easily. If you have any dental work done, tell your dentist you are receiving this medicine. Avoid taking products that contain aspirin, acetaminophen, ibuprofen, naproxen, or ketoprofen unless instructed by your doctor. These medicines may hide a fever. Do not become pregnant while taking this medicine. Women should inform their doctor if they wish to become pregnant or think they might be pregnant. There is a potential for serious side effects to an unborn child. Talk to your health care professional or pharmacist for more information. Do not breast-feed an infant while taking this medicine. What side effects may I notice from receiving this medicine? Side effects that you should report to your doctor or health care professional as soon as possible: -allergic reactions like skin rash, itching or hives, swelling of the face, lips, or tongue -low blood counts - this medicine may decrease the number of white blood cells, red blood cells and platelets. You may be at increased risk for infections and bleeding. -signs of infection - fever or chills, cough, sore throat, pain or difficulty passing urine -signs of decreased platelets or bleeding - bruising, pinpoint red spots on the skin, black, tarry stools, blood in the urine -signs of decreased red blood cells - unusually weak or tired, fainting spells, lightheadedness -pain, tingling, numbness in the hands or feet Side effects that usually do not require medical attention (Report these to your doctor or health care professional if they continue or are bothersome.): -constipation -hair loss -headache -loss of appetite -muscle or joint pain -nausea,  vomiting -stomach pain This list may not describe all possible side effects. Call your doctor for medical advice about side effects. You may report side effects to FDA at 1-800-FDA-1088. Where should I keep my medicine? This drug is given in a hospital or clinic and will not be stored at home. NOTE: This sheet is a summary. It may not cover all possible information. If you have questions about this medicine, talk to your doctor, pharmacist, or health care provider.  2015, Elsevier/Gold Standard. (2009-11-20 23:04:37)   

## 2014-07-10 NOTE — Progress Notes (Signed)
Pt c/o constant generalized pain, has home pain meds, but does not want to currently take anything, bc she wants to see how she reacts to the treatment.  Pt notified that treatment is short but that if at any time she becomes too uncomfortable RN would be happy to provide pain medication for her.  Pt verbalized understanding.    Ok to treat today per Dr. Jana Hakim.  K 2.7.  Pt to get IV potassium today.  Notified pt of plan, pt verbalized understanding.

## 2014-07-10 NOTE — Telephone Encounter (Signed)
DEMOGRAPHIC FAXED TO [facility] @ [phone].  SCANS HAVE BEEN REQUESTED.

## 2014-07-11 ENCOUNTER — Ambulatory Visit: Payer: No Typology Code available for payment source | Admitting: Physical Therapy

## 2014-07-11 ENCOUNTER — Ambulatory Visit
Admission: RE | Admit: 2014-07-11 | Discharge: 2014-07-11 | Disposition: A | Discharge status: Home / Self Care | Payer: No Typology Code available for payment source | Source: Ambulatory Visit | Attending: Adult Health | Admitting: Adult Health

## 2014-07-11 ENCOUNTER — Telehealth: Payer: Self-pay | Admitting: *Deleted

## 2014-07-11 DIAGNOSIS — C50212 Malignant neoplasm of upper-inner quadrant of left female breast: Secondary | ICD-10-CM

## 2014-07-11 MED ORDER — GADOBENATE DIMEGLUMINE 529 MG/ML IV SOLN
16.0000 mL | Freq: Once | INTRAVENOUS | Status: AC | PRN
  Administered 2014-07-11: 16 mL via INTRAVENOUS

## 2014-07-11 NOTE — Telephone Encounter (Signed)
Chemo f/u call from treatment yest.  Pt reports no problems other than aching all over & trying to manage this.  Reviewed medications & she has oxycodone which she states she tries to only take daily & has @ 4 left.  She is trying to make these last until time for refill.  She does have ultram & was encouraged to try this or the ibuprofen at times between her oxycodone to see if this would help.  She knows she can call with any questions or concerns.

## 2014-07-12 ENCOUNTER — Telehealth: Payer: Self-pay | Admitting: *Deleted

## 2014-07-12 NOTE — Telephone Encounter (Signed)
Called pt to inform her that MRI results were good, showed no metastases. Pt was not home left a detailed message to her VM concerning these results and if she has any questions to call me @ [phone]. Message to be forwarded to Charlestine Massed, NP.

## 2014-07-12 NOTE — Telephone Encounter (Signed)
Message copied by Harmon Pier on Fri Jul 12, 2014  4:52 PM ------      Message from: Minette Headland      Created: Fri Jul 12, 2014  4:06 PM       Please call patient and give her good results       ----- Message -----         From: Rad Results In Interface         Sent: 07/12/2014   4:34 AM           To: Minette Headland, NP                   ------

## 2014-07-15 ENCOUNTER — Other Ambulatory Visit: Payer: Self-pay | Admitting: *Deleted

## 2014-07-15 MED ORDER — FENTANYL 25 MCG/HR TD PT72: 25.0000 ug | MEDICATED_PATCH | TRANSDERMAL | Status: DC

## 2014-07-15 MED ORDER — FENTANYL 75 MCG/HR TD PT72: 75.0000 ug | MEDICATED_PATCH | TRANSDERMAL | Status: DC

## 2014-07-16 ENCOUNTER — Other Ambulatory Visit: Payer: Self-pay | Admitting: *Deleted

## 2014-07-16 DIAGNOSIS — C50212 Malignant neoplasm of upper-inner quadrant of left female breast: Secondary | ICD-10-CM

## 2014-07-16 MED ORDER — OXYCODONE HCL 5 MG PO TABS: 5.0000 mg | ORAL_TABLET | Freq: Two times a day (BID) | ORAL | Status: DC | PRN

## 2014-07-17 ENCOUNTER — Other Ambulatory Visit (HOSPITAL_BASED_OUTPATIENT_CLINIC_OR_DEPARTMENT_OTHER): Payer: PRIVATE HEALTH INSURANCE

## 2014-07-17 ENCOUNTER — Other Ambulatory Visit: Payer: Self-pay | Admitting: *Deleted

## 2014-07-17 ENCOUNTER — Ambulatory Visit (HOSPITAL_BASED_OUTPATIENT_CLINIC_OR_DEPARTMENT_OTHER): Payer: Medicaid Other | Admitting: Nurse Practitioner

## 2014-07-17 ENCOUNTER — Encounter: Payer: Self-pay | Admitting: Nurse Practitioner

## 2014-07-17 ENCOUNTER — Ambulatory Visit (HOSPITAL_BASED_OUTPATIENT_CLINIC_OR_DEPARTMENT_OTHER): Payer: PRIVATE HEALTH INSURANCE

## 2014-07-17 VITALS — BP 113/69 | HR 108 | Temp 98.7°F | Resp 18 | Ht 65.5 in | Wt 192.1 lb

## 2014-07-17 DIAGNOSIS — E876 Hypokalemia: Secondary | ICD-10-CM

## 2014-07-17 DIAGNOSIS — C50219 Malignant neoplasm of upper-inner quadrant of unspecified female breast: Secondary | ICD-10-CM | POA: Diagnosis not present

## 2014-07-17 DIAGNOSIS — Z171 Estrogen receptor negative status [ER-]: Secondary | ICD-10-CM | POA: Diagnosis not present

## 2014-07-17 DIAGNOSIS — K7689 Other specified diseases of liver: Secondary | ICD-10-CM

## 2014-07-17 DIAGNOSIS — J90 Pleural effusion, not elsewhere classified: Secondary | ICD-10-CM

## 2014-07-17 DIAGNOSIS — C773 Secondary and unspecified malignant neoplasm of axilla and upper limb lymph nodes: Secondary | ICD-10-CM | POA: Diagnosis not present

## 2014-07-17 DIAGNOSIS — C50912 Malignant neoplasm of unspecified site of left female breast: Secondary | ICD-10-CM

## 2014-07-17 DIAGNOSIS — Z86718 Personal history of other venous thrombosis and embolism: Secondary | ICD-10-CM

## 2014-07-17 DIAGNOSIS — R911 Solitary pulmonary nodule: Secondary | ICD-10-CM | POA: Diagnosis not present

## 2014-07-17 DIAGNOSIS — G629 Polyneuropathy, unspecified: Secondary | ICD-10-CM

## 2014-07-17 DIAGNOSIS — C50212 Malignant neoplasm of upper-inner quadrant of left female breast: Secondary | ICD-10-CM

## 2014-07-17 DIAGNOSIS — G609 Hereditary and idiopathic neuropathy, unspecified: Secondary | ICD-10-CM

## 2014-07-17 DIAGNOSIS — Z5111 Encounter for antineoplastic chemotherapy: Secondary | ICD-10-CM

## 2014-07-17 LAB — CBC WITH DIFFERENTIAL/PLATELET
BASO%: 0.8 % (ref 0.0–2.0)
Basophils Absolute: 0 10*3/uL (ref 0.0–0.1)
EOS%: 0.4 % (ref 0.0–7.0)
Eosinophils Absolute: 0 10*3/uL (ref 0.0–0.5)
HCT: 33.4 % — ABNORMAL LOW (ref 34.8–46.6)
HGB: 10.4 g/dL — ABNORMAL LOW (ref 11.6–15.9)
LYMPH%: 15.4 % (ref 14.0–49.7)
MCH: 26.5 pg (ref 25.1–34.0)
MCHC: 31.1 g/dL — AB (ref 31.5–36.0)
MCV: 85 fL (ref 79.5–101.0)
MONO#: 0.2 10*3/uL (ref 0.1–0.9)
MONO%: 3.9 % (ref 0.0–14.0)
NEUT#: 3.9 10*3/uL (ref 1.5–6.5)
NEUT%: 79.5 % — ABNORMAL HIGH (ref 38.4–76.8)
PLATELETS: 362 10*3/uL (ref 145–400)
RBC: 3.93 10*6/uL (ref 3.70–5.45)
RDW: 14.3 % (ref 11.2–14.5)
WBC: 4.9 10*3/uL (ref 3.9–10.3)
lymph#: 0.8 10*3/uL — ABNORMAL LOW (ref 0.9–3.3)

## 2014-07-17 LAB — COMPREHENSIVE METABOLIC PANEL (CC13)
ALK PHOS: 110 U/L (ref 40–150)
ALT: 23 U/L (ref 0–55)
ANION GAP: 7 meq/L (ref 3–11)
AST: 19 U/L (ref 5–34)
Albumin: 2.6 g/dL — ABNORMAL LOW (ref 3.5–5.0)
BILIRUBIN TOTAL: 0.43 mg/dL (ref 0.20–1.20)
BUN: 6.6 mg/dL — ABNORMAL LOW (ref 7.0–26.0)
CO2: 32 meq/L — AB (ref 22–29)
CREATININE: 0.7 mg/dL (ref 0.6–1.1)
Calcium: 9.6 mg/dL (ref 8.4–10.4)
Chloride: 102 mEq/L (ref 98–109)
Glucose: 105 mg/dl (ref 70–140)
Potassium: 3.1 mEq/L — ABNORMAL LOW (ref 3.5–5.1)
Sodium: 141 mEq/L (ref 136–145)
TOTAL PROTEIN: 7.4 g/dL (ref 6.4–8.3)

## 2014-07-17 MED ORDER — GABAPENTIN 300 MG PO CAPS: 300.0000 mg | ORAL_CAPSULE | Freq: Three times a day (TID) | ORAL | Status: DC

## 2014-07-17 MED ORDER — DEXAMETHASONE SODIUM PHOSPHATE 10 MG/ML IJ SOLN
10.0000 mg | Freq: Once | INTRAMUSCULAR | Status: AC
  Administered 2014-07-17: 10 mg via INTRAVENOUS

## 2014-07-17 MED ORDER — SODIUM CHLORIDE 0.9 % IJ SOLN
10.0000 mL | INTRAMUSCULAR | Status: DC | PRN
  Administered 2014-07-17: 10 mL
  Filled 2014-07-17: qty 10

## 2014-07-17 MED ORDER — HEPARIN SOD (PORK) LOCK FLUSH 100 UNIT/ML IV SOLN
500.0000 [IU] | Freq: Once | INTRAVENOUS | Status: AC | PRN
  Administered 2014-07-17: 500 [IU]
  Filled 2014-07-17: qty 5

## 2014-07-17 MED ORDER — ONDANSETRON 8 MG/NS 50 ML IVPB
INTRAVENOUS | Status: AC
  Filled 2014-07-17: qty 8

## 2014-07-17 MED ORDER — ONDANSETRON 8 MG/50ML IVPB (CHCC)
8.0000 mg | Freq: Once | INTRAVENOUS | Status: AC
  Administered 2014-07-17: 8 mg via INTRAVENOUS

## 2014-07-17 MED ORDER — SODIUM CHLORIDE 0.9 % IV SOLN
1.4000 mg/m2 | Freq: Once | INTRAVENOUS | Status: AC
  Administered 2014-07-17: 2.85 mg via INTRAVENOUS
  Filled 2014-07-17: qty 5.7

## 2014-07-17 MED ORDER — SODIUM CHLORIDE 0.9 % IV SOLN
Freq: Once | INTRAVENOUS | Status: AC
  Administered 2014-07-17: 12:00:00 via INTRAVENOUS

## 2014-07-17 MED ORDER — TRAMADOL HCL 50 MG PO TABS: 50.0000 mg | ORAL_TABLET | Freq: Four times a day (QID) | ORAL | Status: DC | PRN

## 2014-07-17 NOTE — Progress Notes (Signed)
Per Susanne Borders, NP ok to treat patient with potassium of 3.1.  Pt taking oral potassium

## 2014-07-17 NOTE — Patient Instructions (Signed)
[facility] Discharge Instructions for Patients Receiving Chemotherapy  Today you received the following chemotherapy agents Halaven.  To help prevent nausea and vomiting after your treatment, we encourage you to take your nausea medication as directed.    If you develop nausea and vomiting that is not controlled by your nausea medication, call the clinic.   BELOW ARE SYMPTOMS THAT SHOULD BE REPORTED IMMEDIATELY:  *FEVER GREATER THAN 100.5 F  *CHILLS WITH OR WITHOUT FEVER  NAUSEA AND VOMITING THAT IS NOT CONTROLLED WITH YOUR NAUSEA MEDICATION  *UNUSUAL SHORTNESS OF BREATH  *UNUSUAL BRUISING OR BLEEDING  TENDERNESS IN MOUTH AND THROAT WITH OR WITHOUT PRESENCE OF ULCERS  *URINARY PROBLEMS  *BOWEL PROBLEMS  UNUSUAL RASH Items with * indicate a potential emergency and should be followed up as soon as possible.  Feel free to call the clinic you have any questions or concerns. The clinic phone number is [phone].    

## 2014-07-17 NOTE — Progress Notes (Addendum)
ID: Erika [name] OB: 1971-05-21  MR#: [id number]  CSN#:[id number]  PCP: Erika Mink, MD GYN:   SU: Dr. Donne [name] OTHER MD:  CHIEF COMPLAINT:  Newly metastatic breast cancer CURRENT TREATMENT: Chemotherapy   BREAST CANCER HISTORY: From Dr. Dana [name] original intake note:  "Patient developed a left sided breast mass show up about 6 months prior to diagnosis. 2 months prior this began getting larger rapidly. She was seen in an urgent care where this mass was thought to be an abscess. Apparently there was an attempt to excise or drain this that was not successful and she was referred to Dr. Donne [name] for evaluation. She underwent punch biopsy on 04/23/13 of her skin and the mass. She was then also sent for mm/us. The biopsy shows invasive high grade carcinoma that appears to be breast. It is ER 14%, PR negative, HER-2/neu negative with a Ki-67 that approaches 100%. She has also undergone mm with at least 10 cm upper inner left breast tumor with mild overyling skin thickening. There is also a 3x10 mm cluster of calcs near there also. There is indeterminate 7 mm level 1 left axillary node, she underwent a lymph node biopsy that was positive for disease. No mm evidence of right breast malignancy. An MRI of the breasts was done on 05/22/13 and showed a 14 cm mass in the left breast protruding from the skin and also a 2.5 cm level II axillary node."  Her subsequent history is as detailed below  INTERVAL HISTORY:   Erika [name] returns today for followup of her breast cancer, accompanied by her significant other Erika [name]. Today is day 8 cycle 1 of 4 of eribulin.    REVIEW OF SYSTEMS:  Erika [name] states that her first does of eribulin went "well." She had a little nausea on day 2 and 3 but this was controlled with the home antiemetic plan. Her appetite is still decreased due to altered sensation, but she has been drinking supplemental shakes and making herself eat something everyday. She experienced  no vomiting, diarrhea, or constipation. When she is feeling constipated she is sure to eat vegetables, drink lots of water, and be active. 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 Now she only feels the sensation in the tips of her fingers, whereas previously it was numb all the way to the 1st knuckle. Her toes on both feet are still very numb and sensitive. She denies any loss of balance or coordination due to this. She complains of constant throbbing pain to her left mastectomy site, as well as her back . She is satisfied with tramadol and the fentanyl patch for pain control. She denies cough, shortness of breath, pleurisy, and hemoptysis since her thoracentesis last month. A detailed review of systems was otherwise negative.   PAST MEDICAL HISTORY: Past Medical History  Diagnosis Date  . Breast cancer     Invasive High Grade Carcinoma  . Headache(784.0)     Hx: of Migraines  . Status post chemotherapy     Adriamycin and Cytoxan  . Anxiety     ANXIOUS ABOUT HAVEING BIG SURGERY - MASTECTOMY  . 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 DVT (deep venous thrombosis)     RT ARM-PT ON LOVENOX  . Hx of iron deficiency anemia 11/26/13  . S/P radiation therapy 02-04-14 to 03-18-14  1) Left Chest Wall / 50 Gy in 25 fractions/ 2) Left Supraclavicular fossa/ 46 Gy in 23 fractions / 3) Left Posterior Axillary boost / 8.533 Gy in 23 fractions/ 4) Left Chest Wall Scar boost / 10 Gy in 5 fractions    PAST SURGICAL HISTORY: Past Surgical History  Procedure Laterality Date  . Tubal ligation    . Portacath placement Right 05/22/2013    Procedure: INSERTION PORT-A-CATH;  Surgeon: [name], MD;  Location: [facility];  Service: General;  Laterality: Right;  . Left breast needle core biopsy Left 05/21/13  . Left breast needle core  biopsy Left 06/22/13  . Mastectomy modified radical Left 11/15/2013    Procedure: LEFT MASTECTOMY MODIFIED RADICAL;   Surgeon: [name], MD;  Location: [facility];  Service: General;  Laterality: Left;    FAMILY HISTORY Family History  Problem Relation Age of Onset  . Hypertension Mother   . Diabetes Father 51  . Stomach cancer Maternal Aunt   . Hypertension Maternal Grandmother   . Hypertension Maternal Grandfather   . Diabetes Paternal Grandmother   . Diabetes Paternal Grandfather   . Diabetes Paternal Uncle   . Diabetes Paternal Uncle   . Diabetes Paternal Uncle   The patient's mother is alive, age 32. The patient's father died from complications of diabetes at the age of 18. The patient has 2 brothers, one sister. There is no history of breast or ovarian cancer in the family to her knowledge .the patient has not undergone genetic testing   GYNECOLOGIC HISTORY:  Menarche age 69, first live birth age 27. The patient is GX P3. The patient has been on goserelin since 06/13/2013, given monthly  SOCIAL HISTORY:  The patient has worked at as a Furniture conservator/restorer and a Retail buyer. She is currently applying for disability. At home she lives with her youngest son, Erika [name], 70; with [name]; and with [name].   ADVANCED DIRECTIVES:   HEALTH MAINTENANCE: History  Substance Use Topics  . Smoking status: Never Smoker   . Smokeless tobacco: Never Used  . Alcohol Use: No     Mammogram: 06/22/2013 Colonoscopy: Bone Density Scan:  Pap Smear:  Eye Exam:  Vitamin D Level:   Lipid Panel:    Allergies  Allergen Reactions  . Doxycycline Nausea And Vomiting    Current Outpatient Prescriptions  Medication Sig Dispense Refill  . cyclobenzaprine (FLEXERIL) 5 MG tablet TAKE 1 TABLET BY MOUTH THREE TIMES A DAY FOR MUSCLE SPASMS  30 tablet  1  . dexamethasone (DECADRON) 4 MG tablet Take 2 tablets (8 mg total) by mouth 2 (two) times daily with a meal. Start the day after chemotherapy for 2 days. Take with food.  30 tablet  1  . fentaNYL (DURAGESIC - DOSED  MCG/HR) 25 MCG/HR patch Place 1 patch (25 mcg total) onto the skin every 3 (three) days.  10 patch  0  . fentaNYL (DURAGESIC - DOSED MCG/HR) 75 MCG/HR Place 1 patch (75 mcg total) onto the skin every 3 (three) days.  10 patch  0  . gabapentin (NEURONTIN) 300 MG capsule Take 1 capsule (300 mg total) by mouth 3 (three) times daily.  90 capsule  4  . lidocaine-prilocaine (EMLA) cream Apply 1 application topically as needed. Apply over port area 1-2 hours before chemo, then cover with plastic wrap  30 g  0  . LORazepam (ATIVAN) 0.5 MG tablet Take 0.5 mg by mouth every 6 (six) hours  as needed for anxiety.      . ondansetron (ZOFRAN) 8 MG tablet Take 1 tablet (8 mg total) by mouth 2 (two) times daily. Start the day after chemo for 2 days. Then take as needed for nausea or vomiting.  30 tablet  1  . oxyCODONE (OXY IR/ROXICODONE) 5 MG immediate release tablet Take 1 tablet (5 mg total) by mouth every 12 (twelve) hours as needed for severe pain or breakthrough pain.  60 tablet  0  . potassium chloride SA (K-DUR,KLOR-CON) 20 MEQ tablet Take 1 tablet (20 mEq total) by mouth daily.  7 tablet  0  . PRESCRIPTION MEDICATION PT REPORTS RECEIVING A SHOT ONCE A MONTH AT CANCER CENTER TO STOP HER MENSTRUAL PERIODS - I SEE DOCUMENTATION IN OFFICE NOTES FROM CANCER CENTER THAT PT WAS GETTING ZOLADEX SQ EVERY 28 DAYS.      Marland Kitchen UNABLE TO FIND Rx: L8000- Post Surgical Bra (Quantity: 6) O8786- Non Silicone Breast prosthesis (Quantity: 1) Dx: 174.9; Left mastectomy  1 each  0  . zolpidem (AMBIEN) 5 MG tablet Take 1 tablet (5 mg total) by mouth at bedtime as needed for sleep.  30 tablet  0  . ibuprofen (ADVIL,MOTRIN) 200 MG tablet Take 600-800 mg by mouth every 4 (four) hours as needed for mild pain or moderate pain.       Marland Kitchen prochlorperazine (COMPAZINE) 10 MG tablet Take 1 tablet (10 mg total) by mouth every 6 (six) hours as needed (Nausea or vomiting).  30 tablet  1  . traMADol (ULTRAM) 50 MG tablet Take 1-2 tablets (50-100 mg  total) by mouth every 6 (six) hours as needed.  30 tablet  1   No current facility-administered medications for this visit.   Facility-Administered Medications Ordered in Other Visits  Medication Dose Route Frequency Provider Last Rate Last Dose  . topical emolient (BIAFINE) emulsion   Topical Daily [name], MD        OBJECTIVE: middle-aged African American woman who appears stated age 43 Vitals:   07/17/14 0925  BP: 113/69  Pulse: 108  Temp: 98.7 F (37.1 C)  Resp: 18     Body mass index is 31.47 kg/(m^2).      ECOG FS:1 - Symptomatic but completely ambulatory  Skin: warm, dry  HEENT: sclerae anicteric, conjunctivae pink, oropharynx clear. No thrush or mucositis.  Lymph Nodes: No cervical supraclavicular, or axilliary lymphadenopathy  Lungs: left lung clear, right lung diminished in the bases, no rales, wheezes, or rhonci in either Heart: regular rate and rhythm  Abdomen: round, soft, non tender, positive bowel sounds  Musculoskeletal: No focal spinal tenderness, no peripheral edema  Neuro: non focal, well oriented, positive affect  Breast:   LAB RESULTS:  CMP     Component Value Date/Time   NA 141 07/17/2014 0910   NA 138 11/16/2013 0448   K 3.1* 07/17/2014 0910   K 3.5 11/16/2013 0448   CL 103 11/16/2013 0448   CL 102 06/01/2013 1500   CO2 32* 07/17/2014 0910   CO2 27 11/16/2013 0448   GLUCOSE 105 07/17/2014 0910   GLUCOSE 116* 11/16/2013 0448   GLUCOSE 95 06/01/2013 1500   BUN 6.6* 07/17/2014 0910   BUN 8 11/16/2013 0448   CREATININE 0.7 07/17/2014 0910   CREATININE 0.86 11/16/2013 0448   CALCIUM 9.6 07/17/2014 0910   CALCIUM 8.9 11/16/2013 0448   PROT 7.4 07/17/2014 0910   PROT 8.2 05/18/2013 1205   ALBUMIN 2.6* 07/17/2014 0910   ALBUMIN 2.8*  05/18/2013 1205   AST 19 07/17/2014 0910   AST 18 05/18/2013 1205   ALT 23 07/17/2014 0910   ALT 16 05/18/2013 1205   ALKPHOS 110 07/17/2014 0910   ALKPHOS 111 05/18/2013 1205   BILITOT 0.43 07/17/2014 0910   BILITOT 0.2* 05/18/2013 1205    GFRNONAA 82* 11/16/2013 0448   GFRAA >90 11/16/2013 0448    I No results found for this basename: SPEP,  UPEP,   kappa and lambda light chains    Lab Results  Component Value Date   WBC 4.9 07/17/2014   NEUTROABS 3.9 07/17/2014   HGB 10.4* 07/17/2014   HCT 33.4* 07/17/2014   MCV 85.0 07/17/2014   PLT 362 07/17/2014      Chemistry      Component Value Date/Time   NA 141 07/17/2014 0910   NA 138 11/16/2013 0448   K 3.1* 07/17/2014 0910   K 3.5 11/16/2013 0448   CL 103 11/16/2013 0448   CL 102 06/01/2013 1500   CO2 32* 07/17/2014 0910   CO2 27 11/16/2013 0448   BUN 6.6* 07/17/2014 0910   BUN 8 11/16/2013 0448   CREATININE 0.7 07/17/2014 0910   CREATININE 0.86 11/16/2013 0448      Component Value Date/Time   CALCIUM 9.6 07/17/2014 0910   CALCIUM 8.9 11/16/2013 0448   ALKPHOS 110 07/17/2014 0910   ALKPHOS 111 05/18/2013 1205   AST 19 07/17/2014 0910   AST 18 05/18/2013 1205   ALT 23 07/17/2014 0910   ALT 16 05/18/2013 1205   BILITOT 0.43 07/17/2014 0910   BILITOT 0.2* 05/18/2013 1205       Lab Results  Component Value Date   LABCA2 35 05/18/2013    No components found with this basename: [id number]    No results found for this basename: INR,  in the last 168 hours  Urinalysis No results found for this basename: colorurine,  appearanceur,  labspec,  phurine,  glucoseu,  hgbur,  bilirubinur,  ketonesur,  proteinur,  urobilinogen,  nitrite,  leukocytesur    STUDIES: Dg Chest 1 View  07/03/2014   CLINICAL DATA:  Post right thoracentesis, history of breast carcinoma  EXAM: CHEST - 1 VIEW  COMPARISON:  CT chest of 06/26/2014 and chest x-ray of 05/22/2013  FINDINGS: Post right thoracentesis there has been reduction in the volume of the right effusion although a small to moderate size right effusion remains. There is right basilar atelectasis present. No pneumothorax is seen. The left lung is clear. A right-sided Port-A-Cath is noted with the tip overlying the lower SVC. The heart is within normal limits in size.   IMPRESSION: Reduction in volume of the right pleural effusion after right thoracentesis. No pneumothorax.   Electronically Signed   By: Ivar Drape M.D.   On: 07/03/2014 14:01    ASSESSMENT: 43 y.o.  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  (0) genetic testing of the ATM, BARD1, BRCA1, BRCA2, BRIP1, CDH1, CHEK2, EPCAM, FANCC, MLH1, MSH2, MSH6, NBN, PALB2, PMS2, PTEN, RAD51C, RAD51D, STK11, TP53, and XRCC2 genes showed no deleterious mutations  (1) status post left breast biopsy 04/23/2013 for a clinical T4 N1, stage IIIB invasive ductal carcinoma, grade 3,  estrogen receptor 14% positive with moderate staining intensity, progesterone receptor negative,  with an MIB-1 of  100% and HER-2/neu  nonamplified  (2) biopsy of a left axillary lymph node [phone] was positive  (3) started monthly goserelin July of 02/01/2013  (4)  received  neoadjuvant chemotherapy with doxorubicin and cyclophosphamide in dose dense fashion x4, followed by carboplatin and paclitaxel x1, discontinued due to neuropathy, followed by carboplatin and gemcitabine, both given day 1 of each 14 day cycle x6, completed 10/12/2013 .   (5) right upper extremity DVT documented 06/29/2013,  treated with Lovenox from  July  through  December 2014    (6) status post left modified radical mastectomy 11/15/2013 for a residual pT3 pN0 invasive ductal carcinoma, grade 3, with repeat prognostic panel triple negative. All 12 axillary lymph nodes were clear. Margins were negative   (5) completed adjuvant radiation therapy 03/18/2014,  with capecitabine added 2-3 weeks into treatment.  METASTATIC DISEASE  (6) CT scans of the chest, abdomen, and pelvis 06/26/2014 showed a large right pleural effusion  with pleural studding, 2 left upper lobe nodules, and two  liver lesions. Cytology from right thoracentesis 07/03/2014 showed malignant cells consistent with the patient's known primary  (7) to start eribulin  07/09/2014, given days 1 and 8 of each 21 day cycle  (8) referral to [facility] initiated 07/08/2014  PLAN:  Tim is tolerating the eribulin well. Her labs were stable and reviewed in detail with her. Her K+ has improved to 3.1 after the IV replacement last week, and she has been taking her supplements faithfully. She was advised to increase her dose from 45mq daily to BID for the next three days then resume the regular schedule. Her tramadol and gabapentin prescriptions were refilled today also.   The plan is to proceed with day 2, cycle 1 of 4 of eribulin today. She will return for labs and an office visit before day 1 of cycle 2 on 07/30/14. The plan is to go for 3 cycles then restage. The patient understands that the goal of treatment in this case is control. She has been encouraged to call with any issues that may arise before her next visit.   FMarcelino Duster NP  07/17/2014 10:24 AM   ADDENDUM:    I personally saw this patient and performed a substantive portion of this encounter with the listed APP documented above.   MChauncey Cruel MD

## 2014-07-18 ENCOUNTER — Encounter: Payer: Self-pay | Admitting: *Deleted

## 2014-07-18 NOTE — Progress Notes (Signed)
Erika [name]  Clinical Social [name] was referred by patient navigator to sign up pt for [facility] [name].  Clinical Social Worker contacted patient at home to offer support and assess for needs. CSW explained Erika [name] and signed pt up for [name]. Pt denied other concerns currently and is looking forward to the [name].      Erika Racer, LCSW Clinical Social Worker [facility] Wednesday, Thursday and Friday Phone: [phone] Fax: [phone]

## 2014-07-25 ENCOUNTER — Telehealth: Payer: Self-pay | Admitting: *Deleted

## 2014-07-25 NOTE — Telephone Encounter (Signed)
No answer.  LM for pt.

## 2014-07-26 ENCOUNTER — Ambulatory Visit (HOSPITAL_BASED_OUTPATIENT_CLINIC_OR_DEPARTMENT_OTHER): Payer: No Typology Code available for payment source

## 2014-07-26 DIAGNOSIS — C50219 Malignant neoplasm of upper-inner quadrant of unspecified female breast: Secondary | ICD-10-CM

## 2014-07-26 DIAGNOSIS — Z5111 Encounter for antineoplastic chemotherapy: Secondary | ICD-10-CM | POA: Diagnosis not present

## 2014-07-26 DIAGNOSIS — C50212 Malignant neoplasm of upper-inner quadrant of left female breast: Secondary | ICD-10-CM

## 2014-07-26 MED ORDER — GOSERELIN ACETATE 3.6 MG ~~LOC~~ IMPL
3.6000 mg | DRUG_IMPLANT | Freq: Once | SUBCUTANEOUS | Status: AC
  Administered 2014-07-26: 3.6 mg via SUBCUTANEOUS
  Filled 2014-07-26: qty 3.6

## 2014-07-29 ENCOUNTER — Other Ambulatory Visit: Payer: Self-pay | Admitting: *Deleted

## 2014-07-29 DIAGNOSIS — C50212 Malignant neoplasm of upper-inner quadrant of left female breast: Secondary | ICD-10-CM

## 2014-07-30 ENCOUNTER — Other Ambulatory Visit: Payer: Self-pay | Admitting: Oncology

## 2014-07-30 ENCOUNTER — Other Ambulatory Visit (HOSPITAL_BASED_OUTPATIENT_CLINIC_OR_DEPARTMENT_OTHER): Payer: PRIVATE HEALTH INSURANCE

## 2014-07-30 ENCOUNTER — Ambulatory Visit (HOSPITAL_BASED_OUTPATIENT_CLINIC_OR_DEPARTMENT_OTHER): Payer: PRIVATE HEALTH INSURANCE | Admitting: Nurse Practitioner

## 2014-07-30 ENCOUNTER — Ambulatory Visit (HOSPITAL_BASED_OUTPATIENT_CLINIC_OR_DEPARTMENT_OTHER): Payer: PRIVATE HEALTH INSURANCE

## 2014-07-30 VITALS — BP 117/75 | HR 100 | Temp 98.2°F | Resp 18 | Ht 65.5 in | Wt 193.5 lb

## 2014-07-30 DIAGNOSIS — C50912 Malignant neoplasm of unspecified site of left female breast: Secondary | ICD-10-CM

## 2014-07-30 DIAGNOSIS — C773 Secondary and unspecified malignant neoplasm of axilla and upper limb lymph nodes: Secondary | ICD-10-CM

## 2014-07-30 DIAGNOSIS — C50219 Malignant neoplasm of upper-inner quadrant of unspecified female breast: Secondary | ICD-10-CM

## 2014-07-30 DIAGNOSIS — C50212 Malignant neoplasm of upper-inner quadrant of left female breast: Secondary | ICD-10-CM

## 2014-07-30 DIAGNOSIS — E876 Hypokalemia: Secondary | ICD-10-CM

## 2014-07-30 DIAGNOSIS — I82629 Acute embolism and thrombosis of deep veins of unspecified upper extremity: Secondary | ICD-10-CM

## 2014-07-30 DIAGNOSIS — G620 Drug-induced polyneuropathy: Secondary | ICD-10-CM | POA: Insufficient documentation

## 2014-07-30 DIAGNOSIS — J910 Malignant pleural effusion: Secondary | ICD-10-CM

## 2014-07-30 DIAGNOSIS — T451X5A Adverse effect of antineoplastic and immunosuppressive drugs, initial encounter: Secondary | ICD-10-CM

## 2014-07-30 LAB — COMPREHENSIVE METABOLIC PANEL (CC13)
ALT: 14 U/L (ref 0–55)
AST: 15 U/L (ref 5–34)
Albumin: 2.9 g/dL — ABNORMAL LOW (ref 3.5–5.0)
Alkaline Phosphatase: 134 U/L (ref 40–150)
Anion Gap: 5 mEq/L (ref 3–11)
BUN: 8.1 mg/dL (ref 7.0–26.0)
CALCIUM: 9.9 mg/dL (ref 8.4–10.4)
CHLORIDE: 105 meq/L (ref 98–109)
CO2: 29 meq/L (ref 22–29)
Creatinine: 0.8 mg/dL (ref 0.6–1.1)
Glucose: 90 mg/dl (ref 70–140)
Potassium: 3.8 mEq/L (ref 3.5–5.1)
Sodium: 140 mEq/L (ref 136–145)
Total Bilirubin: <0.2 mg/dL (ref 0.20–1.20)
Total Protein: 7.9 g/dL (ref 6.4–8.3)

## 2014-07-30 LAB — CBC WITH DIFFERENTIAL/PLATELET
BASO%: 0.3 % (ref 0.0–2.0)
BASOS ABS: 0 10*3/uL (ref 0.0–0.1)
EOS%: 0.1 % (ref 0.0–7.0)
Eosinophils Absolute: 0 10*3/uL (ref 0.0–0.5)
HEMATOCRIT: 34.9 % (ref 34.8–46.6)
HEMOGLOBIN: 11 g/dL — AB (ref 11.6–15.9)
LYMPH#: 1.2 10*3/uL (ref 0.9–3.3)
LYMPH%: 22.5 % (ref 14.0–49.7)
MCH: 26.2 pg (ref 25.1–34.0)
MCHC: 31.6 g/dL (ref 31.5–36.0)
MCV: 82.8 fL (ref 79.5–101.0)
MONO#: 0.3 10*3/uL (ref 0.1–0.9)
MONO%: 6.2 % (ref 0.0–14.0)
NEUT#: 3.8 10*3/uL (ref 1.5–6.5)
NEUT%: 70.9 % (ref 38.4–76.8)
PLATELETS: 420 10*3/uL — AB (ref 145–400)
RBC: 4.22 10*6/uL (ref 3.70–5.45)
RDW: 15.3 % — ABNORMAL HIGH (ref 11.2–14.5)
WBC: 5.3 10*3/uL (ref 3.9–10.3)

## 2014-07-30 MED ORDER — SODIUM CHLORIDE 0.9 % IV SOLN
1.4000 mg/m2 | Freq: Once | INTRAVENOUS | Status: AC
  Administered 2014-07-30: 2.85 mg via INTRAVENOUS
  Filled 2014-07-30: qty 5.7

## 2014-07-30 MED ORDER — SODIUM CHLORIDE 0.9 % IV SOLN
Freq: Once | INTRAVENOUS | Status: AC
  Administered 2014-07-30: 13:00:00 via INTRAVENOUS

## 2014-07-30 MED ORDER — ONDANSETRON 8 MG/50ML IVPB (CHCC)
8.0000 mg | Freq: Once | INTRAVENOUS | Status: AC
  Administered 2014-07-30: 8 mg via INTRAVENOUS

## 2014-07-30 MED ORDER — DEXAMETHASONE SODIUM PHOSPHATE 10 MG/ML IJ SOLN
INTRAMUSCULAR | Status: AC
  Filled 2014-07-30: qty 1

## 2014-07-30 MED ORDER — ONDANSETRON 8 MG/NS 50 ML IVPB
INTRAVENOUS | Status: AC
  Filled 2014-07-30: qty 8

## 2014-07-30 MED ORDER — DEXAMETHASONE SODIUM PHOSPHATE 10 MG/ML IJ SOLN
10.0000 mg | Freq: Once | INTRAMUSCULAR | Status: AC
  Administered 2014-07-30: 10 mg via INTRAVENOUS

## 2014-07-30 MED ORDER — HEPARIN SOD (PORK) LOCK FLUSH 100 UNIT/ML IV SOLN
500.0000 [IU] | Freq: Once | INTRAVENOUS | Status: AC | PRN
  Administered 2014-07-30: 500 [IU]
  Filled 2014-07-30: qty 5

## 2014-07-30 MED ORDER — SODIUM CHLORIDE 0.9 % IJ SOLN
10.0000 mL | INTRAMUSCULAR | Status: DC | PRN
  Administered 2014-07-30: 10 mL
  Filled 2014-07-30: qty 10

## 2014-07-30 NOTE — Patient Instructions (Signed)
[facility] Discharge Instructions for Patients Receiving Chemotherapy  Today you received the following chemotherapy agents: [place]  To help prevent nausea and vomiting after your treatment, we encourage you to take your nausea medication as prescribed by your physician.    If you develop nausea and vomiting that is not controlled by your nausea medication, call the clinic.   BELOW ARE SYMPTOMS THAT SHOULD BE REPORTED IMMEDIATELY:  *FEVER GREATER THAN 100.5 F  *CHILLS WITH OR WITHOUT FEVER  NAUSEA AND VOMITING THAT IS NOT CONTROLLED WITH YOUR NAUSEA MEDICATION  *UNUSUAL SHORTNESS OF BREATH  *UNUSUAL BRUISING OR BLEEDING  TENDERNESS IN MOUTH AND THROAT WITH OR WITHOUT PRESENCE OF ULCERS  *URINARY PROBLEMS  *BOWEL PROBLEMS  UNUSUAL RASH Items with * indicate a potential emergency and should be followed up as soon as possible.  Feel free to call the clinic you have any questions or concerns. The clinic phone number is [phone].    

## 2014-07-30 NOTE — Progress Notes (Signed)
Unable to get adequate blood return, slight red-tinged. PAC flushed with NS and heparin, explained to patient cath-flo may be needed next visit, as she does not have time to wait. Discussed this with charge RN Diane, patient agreeable and knows to call with any concerns or issues.

## 2014-07-30 NOTE — Progress Notes (Signed)
ID: Erika [name] OB: 1971-04-13  MR#: [id number]  CSN#:[id number]  PCP: Erika Mink, MD GYN:   SU: Dr. Donne [name] OTHER MD:  CHIEF COMPLAINT:  Newly metastatic breast cancer CURRENT TREATMENT: active hemotherapy, goserelin   BREAST CANCER HISTORY: From Dr. Dana [name] original intake note:  "Patient developed a left sided breast mass show up about 6 months prior to diagnosis. 2 months prior this began getting larger rapidly. She was seen in an urgent care where this mass was thought to be an abscess. Apparently there was an attempt to excise or drain this that was not successful and she was referred to Dr. Donne [name] for evaluation. She underwent punch biopsy on 04/23/13 of her skin and the mass. She was then also sent for mm/us. The biopsy shows invasive high grade carcinoma that appears to be breast. It is ER 14%, PR negative, HER-2/neu negative with a Ki-67 that approaches 100%. She has also undergone mm with at least 10 cm upper inner left breast tumor with mild overyling skin thickening. There is also a 3x10 mm cluster of calcs near there also. There is indeterminate 7 mm level 1 left axillary node, she underwent a lymph node biopsy that was positive for disease. No mm evidence of right breast malignancy. An MRI of the breasts was done on 05/22/13 and showed a 14 cm mass in the left breast protruding from the skin and also a 2.5 cm level II axillary node."  Her subsequent history is as detailed below  INTERVAL HISTORY:   Erika [name] returns today for followup of her breast cancer, accompanied by her significant other Erika [name]. Today is day 1 cycle 2 of 4 of eribulin. She also receives goserelin every 4 weeks. Erika [name] is tolerating the eribulin very well, and says she is feeling "great." She denies fevers, chills, nausea, vomiting, diarrhea, or constipation. 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 This  is not new and has only improved so far.   REVIEW OF SYSTEMS:  [name] has documented lower back and side pain for which she is prescribed a fentanyl patch and tramadol. She is satisfied with these pain meds. She complains of a runny nose and some congestion but will be trying claratin later this afternoon. A detailed review of systems was otherwise noncontributory.   PAST MEDICAL HISTORY: Past Medical History  Diagnosis Date  . Breast cancer     Invasive High Grade Carcinoma  . Headache(784.0)     Hx: of Migraines  . Status post chemotherapy     Adriamycin and Cytoxan  . Anxiety     ANXIOUS ABOUT HAVEING BIG SURGERY - MASTECTOMY  . 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 DVT (deep venous thrombosis)     RT ARM-PT ON LOVENOX  . Hx of iron deficiency anemia 11/26/13  . S/P radiation therapy 02-04-14 to 03-18-14                               1) Left Chest Wall / 50 Gy in 25 fractions/ 2) Left Supraclavicular fossa/ 46 Gy in 23 fractions / 3) Left Posterior Axillary boost / 8.533 Gy in 23 fractions/ 4) Left Chest Wall Scar boost / 10 Gy in 5 fractions    PAST SURGICAL HISTORY: Past Surgical History  Procedure Laterality Date  . Tubal ligation    .  Portacath placement Right 05/22/2013    Procedure: INSERTION PORT-A-CATH;  Surgeon: Erika Bookbinder, MD;  Location: [facility];  Service: General;  Laterality: Right;  . Left breast needle core biopsy Left 05/21/13  . Left breast needle core  biopsy Left 06/22/13  . Mastectomy modified radical Left 11/15/2013    Procedure: LEFT MASTECTOMY MODIFIED RADICAL;  Surgeon: Erika Bookbinder, MD;  Location: [facility];  Service: General;  Laterality: Left;    FAMILY HISTORY Family History  Problem Relation Age of Onset  . Hypertension Mother   . Diabetes Father 4  . Stomach cancer Maternal Aunt   . Hypertension Maternal Grandmother   . Hypertension Maternal Grandfather   . Diabetes Paternal Grandmother   . Diabetes Paternal Grandfather   . Diabetes  Paternal Uncle   . Diabetes Paternal Uncle   . Diabetes Paternal Uncle   The patient's mother is alive, age 57. The patient's father died from complications of diabetes at the age of 69. The patient has 2 brothers, one sister. There is no history of breast or ovarian cancer in the family to her knowledge .the patient has not undergone genetic testing   GYNECOLOGIC HISTORY:  Menarche age 25, first live birth age 9. The patient is GX P3. The patient has been on goserelin since 06/13/2013, given monthly  SOCIAL HISTORY:  The patient has worked at as a Furniture conservator/restorer and a Retail buyer. She is currently applying for disability. At home she lives with her youngest son, Erika [name], 46; with [name]; and with [name].   ADVANCED DIRECTIVES:   HEALTH MAINTENANCE: History  Substance Use Topics  . Smoking status: Never Smoker   . Smokeless tobacco: Never Used  . Alcohol Use: No     Mammogram: 06/22/2013 Colonoscopy: Bone Density Scan:  Pap Smear:  Eye Exam:  Vitamin D Level:   Lipid Panel:    Allergies  Allergen Reactions  . Doxycycline Nausea And Vomiting    Current Outpatient Prescriptions  Medication Sig Dispense Refill  . cyclobenzaprine (FLEXERIL) 5 MG tablet TAKE 1 TABLET BY MOUTH THREE TIMES A DAY FOR MUSCLE SPASMS  30 tablet  1  . dexamethasone (DECADRON) 4 MG tablet Take 2 tablets (8 mg total) by mouth 2 (two) times daily with a meal. Start the day after chemotherapy for 2 days. Take with food.  30 tablet  1  . fentaNYL (DURAGESIC - DOSED MCG/HR) 25 MCG/HR patch Place 1 patch (25 mcg total) onto the skin every 3 (three) days.  10 patch  0  . fentaNYL (DURAGESIC - DOSED MCG/HR) 75 MCG/HR Place 1 patch (75 mcg total) onto the skin every 3 (three) days.  10 patch  0  . gabapentin (NEURONTIN) 300 MG capsule Take 1 capsule (300 mg total) by mouth 3 (three) times daily.  90 capsule  4  . ibuprofen (ADVIL,MOTRIN) 200 MG tablet Take  600-800 mg by mouth every 4 (four) hours as needed for mild pain or moderate pain.       Marland Kitchen lidocaine-prilocaine (EMLA) cream Apply 1 application topically as needed. Apply over port area 1-2 hours before chemo, then cover with plastic wrap  30 g  0  . LORazepam (ATIVAN) 0.5 MG tablet Take 0.5 mg by mouth every 6 (six) hours as needed for anxiety.      . ondansetron (ZOFRAN) 8 MG tablet Take 1 tablet (8 mg total) by mouth 2 (two) times daily. Start the day after chemo for 2 days.  Then take as needed for nausea or vomiting.  30 tablet  1  . oxyCODONE (OXY IR/ROXICODONE) 5 MG immediate release tablet Take 1 tablet (5 mg total) by mouth every 12 (twelve) hours as needed for severe pain or breakthrough pain.  60 tablet  0  . potassium chloride SA (K-DUR,KLOR-CON) 20 MEQ tablet Take 1 tablet (20 mEq total) by mouth daily.  7 tablet  0  . PRESCRIPTION MEDICATION PT REPORTS RECEIVING A SHOT ONCE A MONTH AT CANCER CENTER TO STOP HER MENSTRUAL PERIODS - I SEE DOCUMENTATION IN OFFICE NOTES FROM CANCER CENTER THAT PT WAS GETTING ZOLADEX SQ EVERY 28 DAYS.      Marland Kitchen traMADol (ULTRAM) 50 MG tablet Take 1-2 tablets (50-100 mg total) by mouth every 6 (six) hours as needed.  30 tablet  1  . UNABLE TO FIND Rx: L8000- Post Surgical Bra (Quantity: 6) U2025- Non Silicone Breast prosthesis (Quantity: 1) Dx: 174.9; Left mastectomy  1 each  0  . zolpidem (AMBIEN) 5 MG tablet Take 1 tablet (5 mg total) by mouth at bedtime as needed for sleep.  30 tablet  0  . prochlorperazine (COMPAZINE) 10 MG tablet Take 1 tablet (10 mg total) by mouth every 6 (six) hours as needed (Nausea or vomiting).  30 tablet  1   No current facility-administered medications for this visit.   Facility-Administered Medications Ordered in Other Visits  Medication Dose Route Frequency Provider Last Rate Last Dose  . topical emolient (BIAFINE) emulsion   Topical Daily [name], MD        OBJECTIVE: middle-aged African American woman who appears stated  age 43 Vitals:   07/30/14 1123  BP: 117/75  Pulse: 100  Temp: 98.2 F (36.8 C)  Resp: 18     Body mass index is 31.7 kg/(m^2).      ECOG FS:1 - Symptomatic but completely ambulatory  BP 117/75  Pulse 100  Temp(Src) 98.2 F (36.8 C) (Oral)  Resp 18  Ht 5' 5.5" (1.664 m)  Wt 193 lb 8 oz (87.771 kg)  BMI 31.70 kg/m2  SpO2 100%  LMP 05/30/2013  Sclerae unicteric, pupils equal and reactive Oropharynx clear and moist-- no thrush No cervical or supraclavicular adenopathy Lungs no rales or rhonchi Heart regular rate and rhythm Abd soft, nontender, positive bowel sounds MSK no focal spinal tenderness, no upper extremity lymphedema Neuro: nonfocal, well oriented, appropriate affect Breasts: deferred   LAB RESULTS:  CMP     Component Value Date/Time   NA 140 07/30/2014 1108   NA 138 11/16/2013 0448   K 3.8 07/30/2014 1108   K 3.5 11/16/2013 0448   CL 103 11/16/2013 0448   CL 102 06/01/2013 1500   CO2 29 07/30/2014 1108   CO2 27 11/16/2013 0448   GLUCOSE 90 07/30/2014 1108   GLUCOSE 116* 11/16/2013 0448   GLUCOSE 95 06/01/2013 1500   BUN 8.1 07/30/2014 1108   BUN 8 11/16/2013 0448   CREATININE 0.8 07/30/2014 1108   CREATININE 0.86 11/16/2013 0448   CALCIUM 9.9 07/30/2014 1108   CALCIUM 8.9 11/16/2013 0448   PROT 7.9 07/30/2014 1108   PROT 8.2 05/18/2013 1205   ALBUMIN 2.9* 07/30/2014 1108   ALBUMIN 2.8* 05/18/2013 1205   AST 15 07/30/2014 1108   AST 18 05/18/2013 1205   ALT 14 07/30/2014 1108   ALT 16 05/18/2013 1205   ALKPHOS 134 07/30/2014 1108   ALKPHOS 111 05/18/2013 1205   BILITOT <0.20 07/30/2014 1108   BILITOT 0.2* 05/18/2013  1205   GFRNONAA 82* 11/16/2013 0448   GFRAA >90 11/16/2013 0448    I No results found for this basename: SPEP,  UPEP,   kappa and lambda light chains    Lab Results  Component Value Date   WBC 5.3 07/30/2014   NEUTROABS 3.8 07/30/2014   HGB 11.0* 07/30/2014   HCT 34.9 07/30/2014   MCV 82.8 07/30/2014   PLT 420* 07/30/2014      Chemistry      Component  Value Date/Time   NA 140 07/30/2014 1108   NA 138 11/16/2013 0448   K 3.8 07/30/2014 1108   K 3.5 11/16/2013 0448   CL 103 11/16/2013 0448   CL 102 06/01/2013 1500   CO2 29 07/30/2014 1108   CO2 27 11/16/2013 0448   BUN 8.1 07/30/2014 1108   BUN 8 11/16/2013 0448   CREATININE 0.8 07/30/2014 1108   CREATININE 0.86 11/16/2013 0448      Component Value Date/Time   CALCIUM 9.9 07/30/2014 1108   CALCIUM 8.9 11/16/2013 0448   ALKPHOS 134 07/30/2014 1108   ALKPHOS 111 05/18/2013 1205   AST 15 07/30/2014 1108   AST 18 05/18/2013 1205   ALT 14 07/30/2014 1108   ALT 16 05/18/2013 1205   BILITOT <0.20 07/30/2014 1108   BILITOT 0.2* 05/18/2013 1205       Lab Results  Component Value Date   LABCA2 35 05/18/2013    No components found with this basename: [id number]    No results found for this basename: INR,  in the last 168 hours  Urinalysis No results found for this basename: colorurine,  appearanceur,  labspec,  phurine,  glucoseu,  hgbur,  bilirubinur,  ketonesur,  proteinur,  urobilinogen,  nitrite,  leukocytesur    STUDIES: Dg Chest 1 View  07/03/2014   CLINICAL DATA:  Post right thoracentesis, history of breast carcinoma  EXAM: CHEST - 1 VIEW  COMPARISON:  CT chest of 06/26/2014 and chest x-ray of 05/22/2013  FINDINGS: Post right thoracentesis there has been reduction in the volume of the right effusion although a small to moderate size right effusion remains. There is right basilar atelectasis present. No pneumothorax is seen. The left lung is clear. A right-sided Port-A-Cath is noted with the tip overlying the lower SVC. The heart is within normal limits in size.  IMPRESSION: Reduction in volume of the right pleural effusion after right thoracentesis. No pneumothorax.   Electronically Signed   By: Ivar Drape M.D.   On: 07/03/2014 14:01   Mr Jeri Cos OZ Contrast  07/12/2014   CLINICAL DATA:  Breast cancer, evaluate for metastatic disease.  EXAM: MRI HEAD WITHOUT AND WITH CONTRAST  TECHNIQUE: Multiplanar,  multiecho pulse sequences of the brain and surrounding structures were obtained without and with intravenous contrast.  CONTRAST:  36m MULTIHANCE GADOBENATE DIMEGLUMINE 529 MG/ML IV SOLN  COMPARISON:  None.  FINDINGS: The CSF containing spaces are within normal limits for patient age. No focal parenchymal signal abnormality is identified. No mass lesion, midline shift, or extra-axial fluid collection. Ventricles are normal in size without evidence of hydrocephalus. No abnormal enhancement.  No diffusion-weighted signal abnormality is identified to suggest acute intracranial infarct. Gray-white matter differentiation is maintained. Normal flow voids are seen within the intracranial vasculature. No intracranial hemorrhage identified.  The cervicomedullary junction is normal. Pituitary gland is within normal limits. Pituitary stalk is midline. The globes and optic nerves demonstrate a normal appearance with normal signal intensity.  The bone marrow signal  intensity is normal. Calvarium is intact. Visualized upper cervical spine is within normal limits.  Scalp soft tissues are unremarkable.  Paranasal sinuses are clear.  No mastoid effusion.  IMPRESSION: Normal contrast enhanced MRI of the brain. No evidence of intracranial metastasis.   Electronically Signed   By: Jeannine Boga M.D.   On: 07/12/2014 04:31   US Thoracentesis Asp Pleural Space W/img Guide  07/03/2014   CLINICAL DATA:  History of breast cancer. Right-sided pleural effusion. Request diagnostic and therapeutic thoracentesis.  EXAM: ULTRASOUND GUIDED RIGHT THORACENTESIS  COMPARISON:  None.  PROCEDURE: An ultrasound guided thoracentesis was thoroughly discussed with the patient and questions answered. The benefits, risks, alternatives and complications were also discussed. The patient understands and wishes to proceed with the procedure. Written consent was obtained.  Ultrasound was performed to localize and mark an adequate pocket of fluid in the  right chest. The area was then prepped and draped in the normal sterile fashion. 1% Lidocaine was used for local anesthesia. Under ultrasound guidance a 19 gauge Yueh catheter was introduced. Thoracentesis was performed. The catheter was removed and a dressing applied.  Complications:  None immediate.  FINDINGS: A total of approximately 1.2 L of amber/ blood-tinged fluid was removed. A fluid sample well assent for laboratory analysis.  IMPRESSION: Successful ultrasound guided right thoracentesis yielding 1.2 L of pleural fluid.  Read by: Ascencion Dike PA-C   Electronically Signed   By: Aletta Edouard M.D.   On: 07/03/2014 14:40    ASSESSMENT: 43 y.o.  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  (0) genetic testing of the ATM, BARD1, BRCA1, BRCA2, BRIP1, CDH1, CHEK2, EPCAM, FANCC, MLH1, MSH2, MSH6, NBN, PALB2, PMS2, PTEN, RAD51C, RAD51D, STK11, TP53, and XRCC2 genes showed no deleterious mutations  (1) status post left breast biopsy 04/23/2013 for a clinical T4 N1, stage IIIB invasive ductal carcinoma, grade 3,  estrogen receptor 14% positive with moderate staining intensity, progesterone receptor negative,  with an MIB-1 of  100% and HER-2/neu  nonamplified  (2) biopsy of a left axillary lymph node [phone] was positive  (3) started monthly goserelin July of 02/01/2013  (4)  received  neoadjuvant chemotherapy with doxorubicin and cyclophosphamide in dose dense fashion x4, followed by carboplatin and paclitaxel x1, discontinued due to neuropathy, followed by carboplatin and gemcitabine, both given day 1 of each 14 day cycle x6, completed 10/12/2013 .   (5) right upper extremity DVT documented 06/29/2013,  treated with Lovenox from  July  through  December 2014    (6) status post left modified radical mastectomy 11/15/2013 for a residual pT3 pN0 invasive ductal carcinoma, grade 3, with repeat prognostic panel triple negative. All 12 axillary lymph nodes were clear.  Margins were negative   (5) completed adjuvant radiation therapy 03/18/2014,  with capecitabine added 2-3 weeks into treatment.  METASTATIC DISEASE  (6) CT scans of the chest, abdomen, and pelvis 06/26/2014 showed a large right pleural effusion  with pleural studding, 2 left upper lobe nodules, and two  liver lesions. Cytology from right thoracentesis 07/03/2014 showed malignant cells consistent with the patient's known primary  (7) to start eribulin 07/09/2014, given days 1 and 8 of each 21 day cycle  (8) referral to [facility] initiated 07/08/2014  PLAN:  Aliyah is tolerating the eribulin well. Her labs were stable and reviewed in detail with her. Her K+ has improved to 3.8 with the oral daily supplements, which she has been taking faithfully.    The plan  is to proceed with day 1, cycle 2 of eribulin today. She will return for labs and an office visit before day 2 of cycle 2 on 08/06/14. The plan is to go for 3 cycles then restage at the end of Sept. She will continue the goserelin injections every 4 weeks. The patient understands that the goal of treatment in this case is control. She has been encouraged to call with any issues that may arise before her next visit.   Marcelino Duster, NP  07/30/2014 12:16 PM

## 2014-08-02 ENCOUNTER — Telehealth: Payer: Self-pay | Admitting: Nurse Practitioner

## 2014-08-02 ENCOUNTER — Telehealth: Payer: Self-pay | Admitting: *Deleted

## 2014-08-02 NOTE — Telephone Encounter (Signed)
Per staff message I have adjusted appt 

## 2014-08-02 NOTE — Telephone Encounter (Signed)
, °

## 2014-08-06 ENCOUNTER — Other Ambulatory Visit: Payer: PRIVATE HEALTH INSURANCE

## 2014-08-06 ENCOUNTER — Encounter: Payer: Self-pay | Admitting: Nurse Practitioner

## 2014-08-06 ENCOUNTER — Ambulatory Visit (HOSPITAL_BASED_OUTPATIENT_CLINIC_OR_DEPARTMENT_OTHER): Payer: PRIVATE HEALTH INSURANCE

## 2014-08-06 ENCOUNTER — Other Ambulatory Visit (HOSPITAL_BASED_OUTPATIENT_CLINIC_OR_DEPARTMENT_OTHER): Payer: PRIVATE HEALTH INSURANCE

## 2014-08-06 ENCOUNTER — Ambulatory Visit (HOSPITAL_BASED_OUTPATIENT_CLINIC_OR_DEPARTMENT_OTHER): Payer: PRIVATE HEALTH INSURANCE | Admitting: Nurse Practitioner

## 2014-08-06 VITALS — BP 104/73 | HR 54 | Temp 98.9°F | Resp 18 | Ht 65.5 in | Wt 195.1 lb

## 2014-08-06 DIAGNOSIS — Z86718 Personal history of other venous thrombosis and embolism: Secondary | ICD-10-CM

## 2014-08-06 DIAGNOSIS — E876 Hypokalemia: Secondary | ICD-10-CM

## 2014-08-06 DIAGNOSIS — J90 Pleural effusion, not elsewhere classified: Secondary | ICD-10-CM

## 2014-08-06 DIAGNOSIS — C50219 Malignant neoplasm of upper-inner quadrant of unspecified female breast: Secondary | ICD-10-CM

## 2014-08-06 DIAGNOSIS — C50912 Malignant neoplasm of unspecified site of left female breast: Secondary | ICD-10-CM

## 2014-08-06 DIAGNOSIS — C787 Secondary malignant neoplasm of liver and intrahepatic bile duct: Secondary | ICD-10-CM

## 2014-08-06 DIAGNOSIS — Z5111 Encounter for antineoplastic chemotherapy: Secondary | ICD-10-CM

## 2014-08-06 DIAGNOSIS — T451X5A Adverse effect of antineoplastic and immunosuppressive drugs, initial encounter: Secondary | ICD-10-CM

## 2014-08-06 DIAGNOSIS — C50212 Malignant neoplasm of upper-inner quadrant of left female breast: Secondary | ICD-10-CM

## 2014-08-06 DIAGNOSIS — G620 Drug-induced polyneuropathy: Secondary | ICD-10-CM

## 2014-08-06 DIAGNOSIS — Z171 Estrogen receptor negative status [ER-]: Secondary | ICD-10-CM

## 2014-08-06 DIAGNOSIS — G609 Hereditary and idiopathic neuropathy, unspecified: Secondary | ICD-10-CM

## 2014-08-06 DIAGNOSIS — C773 Secondary and unspecified malignant neoplasm of axilla and upper limb lymph nodes: Secondary | ICD-10-CM

## 2014-08-06 LAB — CBC WITH DIFFERENTIAL/PLATELET
BASO%: 0.8 % (ref 0.0–2.0)
BASOS ABS: 0.1 10*3/uL (ref 0.0–0.1)
EOS%: 0.3 % (ref 0.0–7.0)
Eosinophils Absolute: 0 10*3/uL (ref 0.0–0.5)
HEMATOCRIT: 32.2 % — AB (ref 34.8–46.6)
HEMOGLOBIN: 10.2 g/dL — AB (ref 11.6–15.9)
LYMPH#: 1.1 10*3/uL (ref 0.9–3.3)
LYMPH%: 15.1 % (ref 14.0–49.7)
MCH: 26.2 pg (ref 25.1–34.0)
MCHC: 31.7 g/dL (ref 31.5–36.0)
MCV: 82.5 fL (ref 79.5–101.0)
MONO#: 0.2 10*3/uL (ref 0.1–0.9)
MONO%: 2.5 % (ref 0.0–14.0)
NEUT#: 6.1 10*3/uL (ref 1.5–6.5)
NEUT%: 81.3 % — AB (ref 38.4–76.8)
PLATELETS: 338 10*3/uL (ref 145–400)
RBC: 3.91 10*6/uL (ref 3.70–5.45)
RDW: 16 % — ABNORMAL HIGH (ref 11.2–14.5)
WBC: 7.5 10*3/uL (ref 3.9–10.3)

## 2014-08-06 LAB — COMPREHENSIVE METABOLIC PANEL (CC13)
ALT: 26 U/L (ref 0–55)
AST: 19 U/L (ref 5–34)
Albumin: 2.7 g/dL — ABNORMAL LOW (ref 3.5–5.0)
Alkaline Phosphatase: 124 U/L (ref 40–150)
Anion Gap: 5 mEq/L (ref 3–11)
BILIRUBIN TOTAL: 0.39 mg/dL (ref 0.20–1.20)
BUN: 7 mg/dL (ref 7.0–26.0)
CALCIUM: 9.2 mg/dL (ref 8.4–10.4)
CO2: 30 mEq/L — ABNORMAL HIGH (ref 22–29)
CREATININE: 0.7 mg/dL (ref 0.6–1.1)
Chloride: 105 mEq/L (ref 98–109)
Glucose: 95 mg/dl (ref 70–140)
Potassium: 3.4 mEq/L — ABNORMAL LOW (ref 3.5–5.1)
Sodium: 140 mEq/L (ref 136–145)
Total Protein: 6.6 g/dL (ref 6.4–8.3)

## 2014-08-06 MED ORDER — SODIUM CHLORIDE 0.9 % IJ SOLN
10.0000 mL | INTRAMUSCULAR | Status: DC | PRN
  Administered 2014-08-06: 10 mL
  Filled 2014-08-06: qty 10

## 2014-08-06 MED ORDER — ONDANSETRON 8 MG/50ML IVPB (CHCC)
8.0000 mg | Freq: Once | INTRAVENOUS | Status: AC
  Administered 2014-08-06: 8 mg via INTRAVENOUS

## 2014-08-06 MED ORDER — DEXAMETHASONE SODIUM PHOSPHATE 10 MG/ML IJ SOLN
INTRAMUSCULAR | Status: AC
  Filled 2014-08-06: qty 1

## 2014-08-06 MED ORDER — SODIUM CHLORIDE 0.9 % IV SOLN
Freq: Once | INTRAVENOUS | Status: AC
  Administered 2014-08-06: 14:00:00 via INTRAVENOUS

## 2014-08-06 MED ORDER — HEPARIN SOD (PORK) LOCK FLUSH 100 UNIT/ML IV SOLN
500.0000 [IU] | Freq: Once | INTRAVENOUS | Status: AC | PRN
  Administered 2014-08-06: 500 [IU]
  Filled 2014-08-06: qty 5

## 2014-08-06 MED ORDER — ONDANSETRON 8 MG/NS 50 ML IVPB
INTRAVENOUS | Status: AC
  Filled 2014-08-06: qty 8

## 2014-08-06 MED ORDER — SODIUM CHLORIDE 0.9 % IV SOLN
1.4000 mg/m2 | Freq: Once | INTRAVENOUS | Status: AC
  Administered 2014-08-06: 2.85 mg via INTRAVENOUS
  Filled 2014-08-06: qty 5.7

## 2014-08-06 MED ORDER — DEXAMETHASONE SODIUM PHOSPHATE 10 MG/ML IJ SOLN
10.0000 mg | Freq: Once | INTRAMUSCULAR | Status: AC
  Administered 2014-08-06: 10 mg via INTRAVENOUS

## 2014-08-06 NOTE — Patient Instructions (Addendum)
[facility] Discharge Instructions for Patients Receiving Chemotherapy  Today you received the following chemotherapy agents Erika [name]  To help prevent nausea and vomiting after your treatment, we encourage you to take your nausea medication Zofran, Compazine   If you develop nausea and vomiting that is not controlled by your nausea medication, call the clinic.   BELOW ARE SYMPTOMS THAT SHOULD BE REPORTED IMMEDIATELY:  *FEVER GREATER THAN 100.5 F  *CHILLS WITH OR WITHOUT FEVER  NAUSEA AND VOMITING THAT IS NOT CONTROLLED WITH YOUR NAUSEA MEDICATION  *UNUSUAL SHORTNESS OF BREATH  *UNUSUAL BRUISING OR BLEEDING  TENDERNESS IN MOUTH AND THROAT WITH OR WITHOUT PRESENCE OF ULCERS  *URINARY PROBLEMS  *BOWEL PROBLEMS  UNUSUAL RASH Items with * indicate a potential emergency and should be followed up as soon as possible.  Feel free to call the clinic you have any questions or concerns. The clinic phone number is [phone].

## 2014-08-06 NOTE — Progress Notes (Signed)
ID: Erika [name] OB: 13-Oct-1971  MR#: [id number]  CSN#:[id number]  PCP: [name], MD GYN:   SU: Dr. [name] OTHER MD:  CHIEF COMPLAINT:  Newly metastatic breast cancer CURRENT TREATMENT: active hemotherapy, goserelin   BREAST CANCER HISTORY: From Dr. [name] original intake note:  "Patient developed a left sided breast mass show up about 6 months prior to diagnosis. 2 months prior this began getting larger rapidly. She was seen in an urgent care where this mass was thought to be an abscess. Apparently there was an attempt to excise or drain this that was not successful and she was referred to Dr. [name] for evaluation. She underwent punch biopsy on 04/23/13 of her skin and the mass. She was then also sent for mm/us. The biopsy shows invasive high grade carcinoma that appears to be breast. It is ER 14%, PR negative, HER-2/neu negative with a Ki-67 that approaches 100%. She has also undergone mm with at least 10 cm upper inner left breast tumor with mild overyling skin thickening. There is also a 3x10 mm cluster of calcs near there also. There is indeterminate 7 mm level 1 left axillary node, she underwent a lymph node biopsy that was positive for disease. No mm evidence of right breast malignancy. An MRI of the breasts was done on 05/22/13 and showed a 14 cm mass in the left breast protruding from the skin and also a 2.5 cm level II axillary node."  Her subsequent history is as detailed below  INTERVAL HISTORY:   Erika [name] returns today for followup of her breast cancer, accompanied by her significant other [name]. Today is day 8 cycle 2 of 4 of eribulin. She also receives goserelin every 4 weeks. Erika [name] is tolerating the chemo well, she has very few complaints. She denies fevers, chills, nausea, vomiting, diarrhea, or constipation. She continues to complain of the peripheral neuropathy,  but only in the very tips of her fingers and in the bilateral great toes of each foot.  This is not new and is unchanged from the previous visit. The sinus drainage/congestion she was facing last week has resolved with the use of Claritin.   REVIEW OF SYSTEMS:  Erika [name] has documented lower back and side pain for which she is prescribed a fentanyl patch and tramadol. She is satisfied with these pain meds.  A detailed review of systems was otherwise noncontributory.   PAST MEDICAL HISTORY: Past Medical History  Diagnosis Date  . Breast cancer     Invasive High Grade Carcinoma  . Headache(784.0)     Hx: of Migraines  . Status post chemotherapy     Adriamycin and Cytoxan  . Anxiety     ANXIOUS ABOUT HAVEING BIG SURGERY - MASTECTOMY  . 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 DVT (deep venous thrombosis)     RT ARM-PT ON LOVENOX  . Hx of iron deficiency anemia 11/26/13  . S/P radiation therapy 02-04-14 to 03-18-14                               1) Left Chest Wall / 50 Gy in 25 fractions/ 2) Left Supraclavicular fossa/ 46 Gy in 23 fractions / 3) Left Posterior Axillary boost / 8.533 Gy in 23 fractions/ 4) Left Chest Wall Scar boost / 10 Gy in 5 fractions    PAST SURGICAL HISTORY: Past Surgical History  Procedure Laterality Date  . Tubal ligation    .  Portacath placement Right 05/22/2013    Procedure: INSERTION PORT-A-CATH;  Surgeon: [name], MD;  Location: [facility];  Service: General;  Laterality: Right;  . Left breast needle core biopsy Left 05/21/13  . Left breast needle core  biopsy Left 06/22/13  . Mastectomy modified radical Left 11/15/2013    Procedure: LEFT MASTECTOMY MODIFIED RADICAL;  Surgeon: [name], MD;  Location: [facility];  Service: General;  Laterality: Left;    FAMILY HISTORY Family History  Problem Relation Age of Onset  . Hypertension Mother   . Diabetes Father 79  . Stomach cancer Maternal Aunt   . Hypertension Maternal Grandmother   . Hypertension Maternal Grandfather   . Diabetes Paternal Grandmother   . Diabetes Paternal Grandfather    . Diabetes Paternal Uncle   . Diabetes Paternal Uncle   . Diabetes Paternal Uncle   The patient's mother is alive, age 14. The patient's father died from complications of diabetes at the age of 29. The patient has 2 brothers, one sister. There is no history of breast or ovarian cancer in the family to her knowledge .the patient has not undergone genetic testing   GYNECOLOGIC HISTORY:  Menarche age 52, first live birth age 41. The patient is GX P3. The patient has been on goserelin since 06/13/2013, given monthly  SOCIAL HISTORY:  The patient has worked at as a Furniture conservator/restorer and a Retail buyer. She is currently applying for disability. At home she lives with her youngest son, Erika [name], 37; with [name]; and with the patient's fiance [name].   ADVANCED DIRECTIVES:   HEALTH MAINTENANCE: History  Substance Use Topics  . Smoking status: Never Smoker   . Smokeless tobacco: Never Used  . Alcohol Use: No     Mammogram: 06/22/2013 Colonoscopy: Bone Density Scan:  Pap Smear:  Eye Exam:  Vitamin D Level:   Lipid Panel:    Allergies  Allergen Reactions  . Doxycycline Nausea And Vomiting    Current Outpatient Prescriptions  Medication Sig Dispense Refill  . cyclobenzaprine (FLEXERIL) 5 MG tablet TAKE 1 TABLET BY MOUTH THREE TIMES A DAY FOR MUSCLE SPASMS  30 tablet  1  . dexamethasone (DECADRON) 4 MG tablet Take 2 tablets (8 mg total) by mouth 2 (two) times daily with a meal. Start the day after chemotherapy for 2 days. Take with food.  30 tablet  1  . fentaNYL (DURAGESIC - DOSED MCG/HR) 25 MCG/HR patch Place 1 patch (25 mcg total) onto the skin every 3 (three) days.  10 patch  0  . fentaNYL (DURAGESIC - DOSED MCG/HR) 75 MCG/HR Place 1 patch (75 mcg total) onto the skin every 3 (three) days.  10 patch  0  . gabapentin (NEURONTIN) 300 MG capsule Take 1 capsule (300 mg total) by mouth 3 (three) times daily.  90 capsule  4  . lidocaine-prilocaine (EMLA) cream  Apply 1 application topically as needed. Apply over port area 1-2 hours before chemo, then cover with plastic wrap  30 g  0  . LORazepam (ATIVAN) 0.5 MG tablet Take 0.5 mg by mouth every 6 (six) hours as needed for anxiety.      . ondansetron (ZOFRAN) 8 MG tablet Take 1 tablet (8 mg total) by mouth 2 (two) times daily. Start the day after chemo for 2 days. Then take as needed for nausea or vomiting.  30 tablet  1  . oxyCODONE (OXY IR/ROXICODONE) 5 MG immediate release tablet Take 1 tablet (5 mg total)  by mouth every 12 (twelve) hours as needed for severe pain or breakthrough pain.  60 tablet  0  . potassium chloride SA (K-DUR,KLOR-CON) 20 MEQ tablet Take 1 tablet (20 mEq total) by mouth daily.  7 tablet  0  . PRESCRIPTION MEDICATION PT REPORTS RECEIVING A SHOT ONCE A MONTH AT CANCER CENTER TO STOP HER MENSTRUAL PERIODS - I SEE DOCUMENTATION IN OFFICE NOTES FROM CANCER CENTER THAT PT WAS GETTING ZOLADEX SQ EVERY 28 DAYS.      Marland Kitchen prochlorperazine (COMPAZINE) 10 MG tablet Take 1 tablet (10 mg total) by mouth every 6 (six) hours as needed (Nausea or vomiting).  30 tablet  1  . traMADol (ULTRAM) 50 MG tablet Take 1-2 tablets (50-100 mg total) by mouth every 6 (six) hours as needed.  30 tablet  1  . UNABLE TO FIND Rx: L8000- Post Surgical Bra (Quantity: 6) Z3664- Non Silicone Breast prosthesis (Quantity: 1) Dx: 174.9; Left mastectomy  1 each  0  . zolpidem (AMBIEN) 5 MG tablet Take 1 tablet (5 mg total) by mouth at bedtime as needed for sleep.  30 tablet  0  . ibuprofen (ADVIL,MOTRIN) 200 MG tablet Take 600-800 mg by mouth every 4 (four) hours as needed for mild pain or moderate pain.        No current facility-administered medications for this visit.   Facility-Administered Medications Ordered in Other Visits  Medication Dose Route Frequency Provider Last Rate Last Dose  . topical emolient (BIAFINE) emulsion   Topical Daily [name], MD        OBJECTIVE: middle-aged African American woman who  appears stated age 68 Vitals:   08/06/14 1334  BP: 104/73  Pulse: 54  Temp: 98.9 F (37.2 C)  Resp: 18     Body mass index is 31.96 kg/(m^2).      ECOG FS:0 - Asymptomatic  BP 104/73  Pulse 54  Temp(Src) 98.9 F (37.2 C) (Oral)  Resp 18  Ht 5' 5.5" (1.664 m)  Wt 195 lb 1.6 oz (88.497 kg)  BMI 31.96 kg/m2  LMP 05/30/2013  Skin: warm, dry  HEENT: sclerae anicteric, conjunctivae pink, oropharynx clear. No thrush or mucositis.  Lymph Nodes: No cervical or supraclavicular lymphadenopathy  Lungs: clear to auscultation bilaterally, no rales, wheezes, or rhonci  Heart: regular rate and rhythm  Abdomen: round, soft, non tender, positive bowel sounds  Musculoskeletal: No focal spinal tenderness, no peripheral edema  Neuro: non focal, well oriented, positive affect  Breasts: deferred  LAB RESULTS:  CMP     Component Value Date/Time   NA 140 08/06/2014 1256   NA 138 11/16/2013 0448   K 3.4* 08/06/2014 1256   K 3.5 11/16/2013 0448   CL 103 11/16/2013 0448   CL 102 06/01/2013 1500   CO2 30* 08/06/2014 1256   CO2 27 11/16/2013 0448   GLUCOSE 95 08/06/2014 1256   GLUCOSE 116* 11/16/2013 0448   GLUCOSE 95 06/01/2013 1500   BUN 7.0 08/06/2014 1256   BUN 8 11/16/2013 0448   CREATININE 0.7 08/06/2014 1256   CREATININE 0.86 11/16/2013 0448   CALCIUM 9.2 08/06/2014 1256   CALCIUM 8.9 11/16/2013 0448   PROT 6.6 08/06/2014 1256   PROT 8.2 05/18/2013 1205   ALBUMIN 2.7* 08/06/2014 1256   ALBUMIN 2.8* 05/18/2013 1205   AST 19 08/06/2014 1256   AST 18 05/18/2013 1205   ALT 26 08/06/2014 1256   ALT 16 05/18/2013 1205   ALKPHOS 124 08/06/2014 1256   ALKPHOS 111 05/18/2013  1205   BILITOT 0.39 08/06/2014 1256   BILITOT 0.2* 05/18/2013 1205   GFRNONAA 82* 11/16/2013 0448   GFRAA >90 11/16/2013 0448    I No results found for this basename: SPEP,  UPEP,   kappa and lambda light chains    Lab Results  Component Value Date   WBC 7.5 08/06/2014   NEUTROABS 6.1 08/06/2014   HGB 10.2* 08/06/2014   HCT 32.2*  08/06/2014   MCV 82.5 08/06/2014   PLT 338 08/06/2014      Chemistry      Component Value Date/Time   NA 140 08/06/2014 1256   NA 138 11/16/2013 0448   K 3.4* 08/06/2014 1256   K 3.5 11/16/2013 0448   CL 103 11/16/2013 0448   CL 102 06/01/2013 1500   CO2 30* 08/06/2014 1256   CO2 27 11/16/2013 0448   BUN 7.0 08/06/2014 1256   BUN 8 11/16/2013 0448   CREATININE 0.7 08/06/2014 1256   CREATININE 0.86 11/16/2013 0448      Component Value Date/Time   CALCIUM 9.2 08/06/2014 1256   CALCIUM 8.9 11/16/2013 0448   ALKPHOS 124 08/06/2014 1256   ALKPHOS 111 05/18/2013 1205   AST 19 08/06/2014 1256   AST 18 05/18/2013 1205   ALT 26 08/06/2014 1256   ALT 16 05/18/2013 1205   BILITOT 0.39 08/06/2014 1256   BILITOT 0.2* 05/18/2013 1205       Lab Results  Component Value Date   LABCA2 35 05/18/2013    No components found with this basename: LABCA125    No results found for this basename: INR,  in the last 168 hours  Urinalysis No results found for this basename: colorurine,  appearanceur,  labspec,  phurine,  glucoseu,  hgbur,  bilirubinur,  ketonesur,  proteinur,  urobilinogen,  nitrite,  leukocytesur    STUDIES: Dg Chest 1 View  07/03/2014   CLINICAL DATA:  Post right thoracentesis, history of breast carcinoma  EXAM: CHEST - 1 VIEW  COMPARISON:  CT chest of 06/26/2014 and chest x-ray of 05/22/2013  FINDINGS: Post right thoracentesis there has been reduction in the volume of the right effusion although a small to moderate size right effusion remains. There is right basilar atelectasis present. No pneumothorax is seen. The left lung is clear. A right-sided Port-A-Cath is noted with the tip overlying the lower SVC. The heart is within normal limits in size.  IMPRESSION: Reduction in volume of the right pleural effusion after right thoracentesis. No pneumothorax.   Electronically Signed   By: Paul  Barry M.D.   On: 07/03/2014 14:01   Mr Brain W Wo Contrast  07/12/2014   CLINICAL DATA:  Breast cancer, evaluate  for metastatic disease.  EXAM: MRI HEAD WITHOUT AND WITH CONTRAST  TECHNIQUE: Multiplanar, multiecho pulse sequences of the brain and surrounding structures were obtained without and with intravenous contrast.  CONTRAST:  16mL MULTIHANCE GADOBENATE DIMEGLUMINE 529 MG/ML IV SOLN  COMPARISON:  None.  FINDINGS: The CSF containing spaces are within normal limits for patient age. No focal parenchymal signal abnormality is identified. No mass lesion, midline shift, or extra-axial fluid collection. Ventricles are normal in size without evidence of hydrocephalus. No abnormal enhancement.  No diffusion-weighted signal abnormality is identified to suggest acute intracranial infarct. Gray-white matter differentiation is maintained. Normal flow voids are seen within the intracranial vasculature. No intracranial hemorrhage identified.  The cervicomedullary junction is normal. Pituitary gland is within normal limits. Pituitary stalk is midline. The globes and optic nerves demonstrate   a normal appearance with normal signal intensity.  The bone marrow signal intensity is normal. Calvarium is intact. Visualized upper cervical spine is within normal limits.  Scalp soft tissues are unremarkable.  Paranasal sinuses are clear.  No mastoid effusion.  IMPRESSION: Normal contrast enhanced MRI of the brain. No evidence of intracranial metastasis.   Electronically Signed   By: Benjamin  McClintock M.D.   On: 07/12/2014 04:31   Us Thoracentesis Asp Pleural Space W/img Guide  07/03/2014   CLINICAL DATA:  History of breast cancer. Right-sided pleural effusion. Request diagnostic and therapeutic thoracentesis.  EXAM: ULTRASOUND GUIDED RIGHT THORACENTESIS  COMPARISON:  None.  PROCEDURE: An ultrasound guided thoracentesis was thoroughly discussed with the patient and questions answered. The benefits, risks, alternatives and complications were also discussed. The patient understands and wishes to proceed with the procedure. Written consent was  obtained.  Ultrasound was performed to localize and mark an adequate pocket of fluid in the right chest. The area was then prepped and draped in the normal sterile fashion. 1% Lidocaine was used for local anesthesia. Under ultrasound guidance a 19 gauge Yueh catheter was introduced. Thoracentesis was performed. The catheter was removed and a dressing applied.  Complications:  None immediate.  FINDINGS: A total of approximately 1.2 L of amber/ blood-tinged fluid was removed. A fluid sample well assent for laboratory analysis.  IMPRESSION: Successful ultrasound guided right thoracentesis yielding 1.2 L of pleural fluid.  Read by: Kevin Bruning PA-C   Electronically Signed   By: Glenn  Yamagata M.D.   On: 07/03/2014 14:40    ASSESSMENT: 42 y.o.  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  (0) genetic testing of the ATM, BARD1, BRCA1, BRCA2, BRIP1, CDH1, CHEK2, EPCAM, FANCC, MLH1, MSH2, MSH6, NBN, PALB2, PMS2, PTEN, RAD51C, RAD51D, STK11, TP53, and XRCC2 genes showed no deleterious mutations  (1) status post left breast biopsy 04/23/2013 for a clinical T4 N1, stage IIIB invasive ductal carcinoma, grade 3,  estrogen receptor 14% positive with moderate staining intensity, progesterone receptor negative,  with an MIB-1 of  100% and HER-2/neu  nonamplified  (2) biopsy of a left axillary lymph node [id number] was positive  (3) started monthly goserelin July of 02/01/2013  (4)  received  neoadjuvant chemotherapy with doxorubicin and cyclophosphamide in dose dense fashion x4, followed by carboplatin and paclitaxel x1, discontinued due to neuropathy, followed by carboplatin and gemcitabine, both given day 1 of each 14 day cycle x6, completed 10/12/2013 .   (5) right upper extremity DVT documented 06/29/2013,  treated with Lovenox from  July  through  December 2014    (6) status post left modified radical mastectomy 11/15/2013 for a residual pT3 pN0 invasive ductal carcinoma,  grade 3, with repeat prognostic panel triple negative. All 12 axillary lymph nodes were clear. Margins were negative   (5) completed adjuvant radiation therapy 03/18/2014,  with capecitabine added 2-3 weeks into treatment.  METASTATIC DISEASE  (6) CT scans of the chest, abdomen, and pelvis 06/26/2014 showed a large right pleural effusion  with pleural studding, 2 left upper lobe nodules, and two  liver lesions. Cytology from right thoracentesis 07/03/2014 showed malignant cells consistent with the patient's known primary  (7) to start eribulin 07/09/2014, given days 1 and 8 of each 21 day cycle  (8) [facility] 07/08/2014  PLAN:  Mayela continues to tolerate the eribulin well. The labs were reviewed in detail with her and were stable. Her K+ has dipped slightly to 3.4, but   she continues to take her oral supplement faithfully.   We will proceed with day 8, cycle 2 of eribulin today. She will have next week off and start cycle 3 on 08/20/14. We will restage at the end of cycle 3 on 9/22. She will continue the goserelin injections every 4 weeks. Lylith understands the goal of treatment in her case is control. She agrees with the plan. She has been encouraged to call with any issues that might arise before her next visit here.   Marcelino Duster, NP  08/06/2014 1:53 PM

## 2014-08-07 ENCOUNTER — Telehealth: Payer: Self-pay | Admitting: Oncology

## 2014-08-07 NOTE — Telephone Encounter (Signed)
cld pt & left a message for pt in re to pt appt time & date-will mail copy of sch

## 2014-08-13 ENCOUNTER — Other Ambulatory Visit: Payer: Self-pay | Admitting: *Deleted

## 2014-08-13 DIAGNOSIS — C50212 Malignant neoplasm of upper-inner quadrant of left female breast: Secondary | ICD-10-CM

## 2014-08-13 MED ORDER — OXYCODONE HCL 5 MG PO TABS: 5.0000 mg | ORAL_TABLET | Freq: Two times a day (BID) | ORAL | Status: DC | PRN

## 2014-08-13 MED ORDER — FENTANYL 25 MCG/HR TD PT72: 25.0000 ug | MEDICATED_PATCH | TRANSDERMAL | Status: DC

## 2014-08-13 MED ORDER — FENTANYL 75 MCG/HR TD PT72: 75.0000 ug | MEDICATED_PATCH | TRANSDERMAL | Status: DC

## 2014-08-20 ENCOUNTER — Ambulatory Visit (HOSPITAL_BASED_OUTPATIENT_CLINIC_OR_DEPARTMENT_OTHER): Payer: No Typology Code available for payment source

## 2014-08-20 ENCOUNTER — Other Ambulatory Visit (HOSPITAL_BASED_OUTPATIENT_CLINIC_OR_DEPARTMENT_OTHER): Payer: No Typology Code available for payment source

## 2014-08-20 ENCOUNTER — Other Ambulatory Visit: Payer: PRIVATE HEALTH INSURANCE

## 2014-08-20 ENCOUNTER — Encounter: Payer: PRIVATE HEALTH INSURANCE | Admitting: Nurse Practitioner

## 2014-08-20 ENCOUNTER — Other Ambulatory Visit: Payer: Self-pay | Admitting: Oncology

## 2014-08-20 VITALS — BP 117/71 | HR 100 | Temp 98.5°F | Resp 18

## 2014-08-20 DIAGNOSIS — E876 Hypokalemia: Secondary | ICD-10-CM

## 2014-08-20 DIAGNOSIS — C50212 Malignant neoplasm of upper-inner quadrant of left female breast: Secondary | ICD-10-CM

## 2014-08-20 DIAGNOSIS — C50912 Malignant neoplasm of unspecified site of left female breast: Secondary | ICD-10-CM

## 2014-08-20 DIAGNOSIS — C50219 Malignant neoplasm of upper-inner quadrant of unspecified female breast: Secondary | ICD-10-CM

## 2014-08-20 DIAGNOSIS — Z5111 Encounter for antineoplastic chemotherapy: Secondary | ICD-10-CM

## 2014-08-20 LAB — CBC WITH DIFFERENTIAL/PLATELET
BASO%: 0.6 % (ref 0.0–2.0)
Basophils Absolute: 0 10*3/uL (ref 0.0–0.1)
EOS%: 0.3 % (ref 0.0–7.0)
Eosinophils Absolute: 0 10*3/uL (ref 0.0–0.5)
HEMATOCRIT: 33.3 % — AB (ref 34.8–46.6)
HGB: 10.5 g/dL — ABNORMAL LOW (ref 11.6–15.9)
LYMPH%: 20.2 % (ref 14.0–49.7)
MCH: 26 pg (ref 25.1–34.0)
MCHC: 31.5 g/dL (ref 31.5–36.0)
MCV: 82.6 fL (ref 79.5–101.0)
MONO#: 0.4 10*3/uL (ref 0.1–0.9)
MONO%: 5.1 % (ref 0.0–14.0)
NEUT%: 73.8 % (ref 38.4–76.8)
NEUTROS ABS: 5.1 10*3/uL (ref 1.5–6.5)
Platelets: 360 10*3/uL (ref 145–400)
RBC: 4.04 10*6/uL (ref 3.70–5.45)
RDW: 17.7 % — ABNORMAL HIGH (ref 11.2–14.5)
WBC: 6.9 10*3/uL (ref 3.9–10.3)
lymph#: 1.4 10*3/uL (ref 0.9–3.3)

## 2014-08-20 LAB — COMPREHENSIVE METABOLIC PANEL (CC13)
ALT: 15 U/L (ref 0–55)
AST: 14 U/L (ref 5–34)
Albumin: 2.9 g/dL — ABNORMAL LOW (ref 3.5–5.0)
Alkaline Phosphatase: 127 U/L (ref 40–150)
Anion Gap: 8 mEq/L (ref 3–11)
BILIRUBIN TOTAL: 0.25 mg/dL (ref 0.20–1.20)
BUN: 8.4 mg/dL (ref 7.0–26.0)
CO2: 26 meq/L (ref 22–29)
Calcium: 9.5 mg/dL (ref 8.4–10.4)
Chloride: 105 mEq/L (ref 98–109)
Creatinine: 0.8 mg/dL (ref 0.6–1.1)
GLUCOSE: 90 mg/dL (ref 70–140)
Potassium: 3.5 mEq/L (ref 3.5–5.1)
Sodium: 139 mEq/L (ref 136–145)
Total Protein: 7.2 g/dL (ref 6.4–8.3)

## 2014-08-20 MED ORDER — SODIUM CHLORIDE 0.9 % IJ SOLN
10.0000 mL | INTRAMUSCULAR | Status: DC | PRN
  Administered 2014-08-20: 10 mL
  Filled 2014-08-20: qty 10

## 2014-08-20 MED ORDER — DEXAMETHASONE SODIUM PHOSPHATE 10 MG/ML IJ SOLN
INTRAMUSCULAR | Status: AC
  Filled 2014-08-20: qty 1

## 2014-08-20 MED ORDER — DEXAMETHASONE SODIUM PHOSPHATE 10 MG/ML IJ SOLN
10.0000 mg | Freq: Once | INTRAMUSCULAR | Status: AC
  Administered 2014-08-20: 10 mg via INTRAVENOUS

## 2014-08-20 MED ORDER — ONDANSETRON 8 MG/NS 50 ML IVPB
INTRAVENOUS | Status: AC
  Filled 2014-08-20: qty 8

## 2014-08-20 MED ORDER — HEPARIN SOD (PORK) LOCK FLUSH 100 UNIT/ML IV SOLN
500.0000 [IU] | Freq: Once | INTRAVENOUS | Status: AC | PRN
  Administered 2014-08-20: 500 [IU]
  Filled 2014-08-20: qty 5

## 2014-08-20 MED ORDER — SODIUM CHLORIDE 0.9 % IV SOLN
Freq: Once | INTRAVENOUS | Status: AC
  Administered 2014-08-20: 12:00:00 via INTRAVENOUS

## 2014-08-20 MED ORDER — ONDANSETRON 8 MG/50ML IVPB (CHCC)
8.0000 mg | Freq: Once | INTRAVENOUS | Status: AC
  Administered 2014-08-20: 8 mg via INTRAVENOUS

## 2014-08-20 MED ORDER — SODIUM CHLORIDE 0.9 % IV SOLN
1.4000 mg/m2 | Freq: Once | INTRAVENOUS | Status: AC
  Administered 2014-08-20: 2.85 mg via INTRAVENOUS
  Filled 2014-08-20: qty 5.7

## 2014-08-20 NOTE — Progress Notes (Signed)
This encounter was created in error - please disregard.

## 2014-08-20 NOTE — Patient Instructions (Signed)
[facility] Discharge Instructions for Patients Receiving Chemotherapy  Today you received the following chemotherapy agents Haloven.  To help prevent nausea and vomiting after your treatment, we encourage you to take your nausea medication as directed.   If you develop nausea and vomiting that is not controlled by your nausea medication, call the clinic.   BELOW ARE SYMPTOMS THAT SHOULD BE REPORTED IMMEDIATELY:  *FEVER GREATER THAN 100.5 F  *CHILLS WITH OR WITHOUT FEVER  NAUSEA AND VOMITING THAT IS NOT CONTROLLED WITH YOUR NAUSEA MEDICATION  *UNUSUAL SHORTNESS OF BREATH  *UNUSUAL BRUISING OR BLEEDING  TENDERNESS IN MOUTH AND THROAT WITH OR WITHOUT PRESENCE OF ULCERS  *URINARY PROBLEMS  *BOWEL PROBLEMS  UNUSUAL RASH Items with * indicate a potential emergency and should be followed up as soon as possible.  Feel free to call the clinic you have any questions or concerns. The clinic phone number is [phone].

## 2014-08-21 ENCOUNTER — Encounter: Payer: Self-pay | Admitting: Radiation Oncology

## 2014-08-21 ENCOUNTER — Ambulatory Visit
Admission: RE | Admit: 2014-08-21 | Discharge: 2014-08-21 | Disposition: A | Discharge status: Home / Self Care | Payer: PRIVATE HEALTH INSURANCE | Source: Ambulatory Visit | Attending: Radiation Oncology | Admitting: Radiation Oncology

## 2014-08-21 VITALS — BP 113/79 | HR 77 | Temp 98.2°F | Ht 65.5 in | Wt 197.3 lb

## 2014-08-21 DIAGNOSIS — C50212 Malignant neoplasm of upper-inner quadrant of left female breast: Secondary | ICD-10-CM

## 2014-08-21 NOTE — Progress Notes (Signed)
Radiation Oncology         [phone] ________________________________  Name: Erika [name] MRN: [id number]  Date: 08/21/2014  DOB: 1971-08-17  Follow-Up Visit Note  Outpatient  CC: [name], MD  [name], MD  Diagnosis and Prior Radiotherapy:   ypT4b, ypN0 left breast INVASIVE DUCTAL CARCINOMA, triple negative, grade III - NOW WITH STAGE IV DISEASE TREATMENT DATES: 02-04-14 to 03-18-14  Site/dose:  1) Left Chest Wall / 50 Gy in 25 fractions  2) Left Supraclavicular fossa/ 46 Gy in 23 fractions  3) Left Posterior Axillary boost / 8.533 Gy in 23 fractions  4) Left Chest Wall Scar boost / 10 Gy in 5 fractions   Narrative:  The patient returns today for routine follow-up.  She unfortunately has hepatic and pulmonary metastases. Receiving systemic therapy.  She feels she is tolerating it well. Her main complaint is missing work.  She would like to resume working in some capacity, ie 20 hrs a week, for morale and QOL.                              ALLERGIES:  is allergic to doxycycline.  Meds: Current Outpatient Prescriptions  Medication Sig Dispense Refill  . cyclobenzaprine (FLEXERIL) 5 MG tablet TAKE 1 TABLET BY MOUTH THREE TIMES A DAY FOR MUSCLE SPASMS  30 tablet  1  . dexamethasone (DECADRON) 4 MG tablet Take 2 tablets (8 mg total) by mouth 2 (two) times daily with a meal. Start the day after chemotherapy for 2 days. Take with food.  30 tablet  1  . fentaNYL (DURAGESIC - DOSED MCG/HR) 25 MCG/HR patch Place 1 patch (25 mcg total) onto the skin every 3 (three) days.  10 patch  0  . fentaNYL (DURAGESIC - DOSED MCG/HR) 75 MCG/HR Place 1 patch (75 mcg total) onto the skin every 3 (three) days.  10 patch  0  . gabapentin (NEURONTIN) 300 MG capsule Take 1 capsule (300 mg total) by mouth 3 (three) times daily.  90 capsule  4  . ibuprofen (ADVIL,MOTRIN) 200 MG tablet Take 600-800 mg by mouth every 4 (four) hours as needed for mild pain or moderate pain.       Marland Kitchen  lidocaine-prilocaine (EMLA) cream Apply 1 application topically as needed. Apply over port area 1-2 hours before chemo, then cover with plastic wrap  30 g  0  . loratadine (CLARITIN) 10 MG tablet Take 10 mg by mouth daily as needed for allergies.      Marland Kitchen LORazepam (ATIVAN) 0.5 MG tablet Take 0.5 mg by mouth every 6 (six) hours as needed for anxiety.      . ondansetron (ZOFRAN) 8 MG tablet Take 1 tablet (8 mg total) by mouth 2 (two) times daily. Start the day after chemo for 2 days. Then take as needed for nausea or vomiting.  30 tablet  1  . oxyCODONE (OXY IR/ROXICODONE) 5 MG immediate release tablet Take 1 tablet (5 mg total) by mouth every 12 (twelve) hours as needed for severe pain or breakthrough pain.  60 tablet  0  . potassium chloride SA (K-DUR,KLOR-CON) 20 MEQ tablet Take 1 tablet (20 mEq total) by mouth daily.  7 tablet  0  . PRESCRIPTION MEDICATION PT REPORTS RECEIVING A SHOT ONCE A MONTH AT CANCER CENTER TO STOP HER MENSTRUAL PERIODS - I SEE DOCUMENTATION IN OFFICE NOTES FROM CANCER CENTER THAT PT WAS GETTING ZOLADEX SQ EVERY 28 DAYS.      Marland Kitchen  prochlorperazine (COMPAZINE) 10 MG tablet Take 1 tablet (10 mg total) by mouth every 6 (six) hours as needed (Nausea or vomiting).  30 tablet  1  . traMADol (ULTRAM) 50 MG tablet Take 1-2 tablets (50-100 mg total) by mouth every 6 (six) hours as needed.  30 tablet  1  . UNABLE TO FIND Rx: L8000- Post Surgical Bra (Quantity: 6) E9381- Non Silicone Breast prosthesis (Quantity: 1) Dx: 174.9; Left mastectomy  1 each  0  . zolpidem (AMBIEN) 5 MG tablet Take 1 tablet (5 mg total) by mouth at bedtime as needed for sleep.  30 tablet  0   No current facility-administered medications for this encounter.   Facility-Administered Medications Ordered in Other Encounters  Medication Dose Route Frequency Provider Last Rate Last Dose  . topical emolient (BIAFINE) emulsion   Topical Daily [name], MD        Physical Findings: The patient is in no acute  distress. Patient is alert and oriented.  height is 5' 5.5" (1.664 m) and weight is 197 lb 4.8 oz (89.495 kg). Her temperature is 98.2 F (36.8 C). Her blood pressure is 113/79 and her pulse is 77. Marland Kitchen    Left chest wall - dry hyperpigmentation.  Lab Findings: Lab Results  Component Value Date   WBC 6.9 08/20/2014   HGB 10.5* 08/20/2014   HCT 33.3* 08/20/2014   MCV 82.6 08/20/2014   PLT 360 08/20/2014    Radiographic Findings: No results found.  Impression/Plan:  She has a lot of Shea butter at home. I advised her to apply TID to Left chest wall to help her skin heal more.    I will send a note to medical oncology to see if she may be "allowed" to work part time, in some capacity that is relatively safe, for her QOL and morale.  I will defer to them.  I will see her back PRN. _____________________________________   [name], MD

## 2014-08-21 NOTE — Progress Notes (Signed)
Erika [name] here today s/p radiation to her left chest wall for Invasive Ductal Carcinoma.  She continues to have stabbing, shooting pains in her left chest wall region.  Currently receiving chemotherapy for Pleural based nodules, Left upper lobe  and right hepatic lobe metastases.  She is on her third cycle now.

## 2014-08-23 ENCOUNTER — Ambulatory Visit (HOSPITAL_BASED_OUTPATIENT_CLINIC_OR_DEPARTMENT_OTHER): Payer: No Typology Code available for payment source

## 2014-08-23 VITALS — BP 110/83 | HR 95

## 2014-08-23 DIAGNOSIS — Z5111 Encounter for antineoplastic chemotherapy: Secondary | ICD-10-CM

## 2014-08-23 DIAGNOSIS — C50219 Malignant neoplasm of upper-inner quadrant of unspecified female breast: Secondary | ICD-10-CM

## 2014-08-23 MED ORDER — GOSERELIN ACETATE 3.6 MG ~~LOC~~ IMPL
3.6000 mg | DRUG_IMPLANT | Freq: Once | SUBCUTANEOUS | Status: AC
Start: 2014-08-23 — End: 2014-08-23
  Administered 2014-08-23: 3.6 mg via SUBCUTANEOUS
  Filled 2014-08-23: qty 3.6

## 2014-08-23 NOTE — Patient Instructions (Signed)
Goserelin injection What is this medicine? GOSERELIN (GOE se rel in) is similar to a hormone found in the body. It lowers the amount of sex hormones that the body makes. Men will have lower testosterone levels and women will have lower estrogen levels while taking this medicine. In men, this medicine is used to treat prostate cancer; the injection is either given once per month or once every 12 weeks. A once per month injection (only) is used to treat women with endometriosis, dysfunctional uterine bleeding, or advanced breast cancer. This medicine may be used for other purposes; ask your health care provider or pharmacist if you have questions. COMMON BRAND NAME(S): Zoladex What should I tell my health care provider before I take this medicine? They need to know if you have any of these conditions (some only apply to women): -diabetes -heart disease or previous heart attack -high blood pressure -high cholesterol -kidney disease -osteoporosis or low bone density -problems passing urine -spinal cord injury -stroke -tobacco smoker -an unusual or allergic reaction to goserelin, hormone therapy, other medicines, foods, dyes, or preservatives -pregnant or trying to get pregnant -breast-feeding How should I use this medicine? This medicine is for injection under the skin. It is given by a health care professional in a hospital or clinic setting. Men receive this injection once every 4 weeks or once every 12 weeks. Women will only receive the once every 4 weeks injection. Talk to your pediatrician regarding the use of this medicine in children. Special care may be needed. Overdosage: If you think you have taken too much of this medicine contact a poison control center or emergency room at once. NOTE: This medicine is only for you. Do not share this medicine with others. What if I miss a dose? It is important not to miss your dose. Call your doctor or health care professional if you are unable to  keep an appointment. What may interact with this medicine? -female hormones like estrogen -herbal or dietary supplements like black cohosh, chasteberry, or DHEA -female hormones like testosterone -prasterone This list may not describe all possible interactions. Give your health care provider a list of all the medicines, herbs, non-prescription drugs, or dietary supplements you use. Also tell them if you smoke, drink alcohol, or use illegal drugs. Some items may interact with your medicine. What should I watch for while using this medicine? Visit your doctor or health care professional for regular checks on your progress. Your symptoms may appear to get worse during the first weeks of this therapy. Tell your doctor or healthcare professional if your symptoms do not start to get better or if they get worse after this time. Your bones may get weaker if you take this medicine for a long time. If you smoke or frequently drink alcohol you may increase your risk of bone loss. A family history of osteoporosis, chronic use of drugs for seizures (convulsions), or corticosteroids can also increase your risk of bone loss. Talk to your doctor about how to keep your bones strong. This medicine should stop regular monthly menstration in women. Tell your doctor if you continue to menstrate. Women should not become pregnant while taking this medicine or for 12 weeks after stopping this medicine. Women should inform their doctor if they wish to become pregnant or think they might be pregnant. There is a potential for serious side effects to an unborn child. Talk to your health care professional or pharmacist for more information. Do not breast-feed an infant while taking   this medicine. Men should inform their doctors if they wish to father a child. This medicine may lower sperm counts. Talk to your health care professional or pharmacist for more information. What side effects may I notice from receiving this  medicine? Side effects that you should report to your doctor or health care professional as soon as possible: -allergic reactions like skin rash, itching or hives, swelling of the face, lips, or tongue -bone pain -breathing problems -changes in vision -chest pain -feeling faint or lightheaded, falls -fever, chills -pain, swelling, warmth in the leg -pain, tingling, numbness in the hands or feet -signs and symptoms of low blood pressure like dizziness; feeling faint or lightheaded, falls; unusually weak or tired -stomach pain -swelling of the ankles, feet, hands -trouble passing urine or change in the amount of urine -unusually high or low blood pressure -unusually weak or tired Side effects that usually do not require medical attention (report to your doctor or health care professional if they continue or are bothersome): -change in sex drive or performance -changes in breast size in both males and females -changes in emotions or moods -headache -hot flashes -irritation at site where injected -loss of appetite -skin problems like acne, dry skin -vaginal dryness This list may not describe all possible side effects. Call your doctor for medical advice about side effects. You may report side effects to FDA at 1-800-FDA-1088. Where should I keep my medicine? This drug is given in a hospital or clinic and will not be stored at home. NOTE: This sheet is a summary. It may not cover all possible information. If you have questions about this medicine, talk to your doctor, pharmacist, or health care provider.  2015, [employer]. (2014-02-05 11:10:35)  

## 2014-08-27 ENCOUNTER — Other Ambulatory Visit (HOSPITAL_BASED_OUTPATIENT_CLINIC_OR_DEPARTMENT_OTHER): Payer: PRIVATE HEALTH INSURANCE

## 2014-08-27 ENCOUNTER — Ambulatory Visit (HOSPITAL_BASED_OUTPATIENT_CLINIC_OR_DEPARTMENT_OTHER): Payer: No Typology Code available for payment source

## 2014-08-27 VITALS — BP 126/80 | HR 97 | Temp 98.4°F | Resp 18

## 2014-08-27 DIAGNOSIS — C50219 Malignant neoplasm of upper-inner quadrant of unspecified female breast: Secondary | ICD-10-CM

## 2014-08-27 DIAGNOSIS — C50912 Malignant neoplasm of unspecified site of left female breast: Secondary | ICD-10-CM

## 2014-08-27 DIAGNOSIS — Z5111 Encounter for antineoplastic chemotherapy: Secondary | ICD-10-CM

## 2014-08-27 DIAGNOSIS — C50212 Malignant neoplasm of upper-inner quadrant of left female breast: Secondary | ICD-10-CM

## 2014-08-27 DIAGNOSIS — E876 Hypokalemia: Secondary | ICD-10-CM

## 2014-08-27 LAB — CBC WITH DIFFERENTIAL/PLATELET
BASO%: 1.2 % (ref 0.0–2.0)
BASOS ABS: 0.1 10*3/uL (ref 0.0–0.1)
EOS%: 0.2 % (ref 0.0–7.0)
Eosinophils Absolute: 0 10*3/uL (ref 0.0–0.5)
HEMATOCRIT: 32.2 % — AB (ref 34.8–46.6)
HGB: 10.3 g/dL — ABNORMAL LOW (ref 11.6–15.9)
LYMPH%: 15.9 % (ref 14.0–49.7)
MCH: 26.2 pg (ref 25.1–34.0)
MCHC: 32 g/dL (ref 31.5–36.0)
MCV: 81.8 fL (ref 79.5–101.0)
MONO#: 0.3 10*3/uL (ref 0.1–0.9)
MONO%: 2.7 % (ref 0.0–14.0)
NEUT#: 7.7 10*3/uL — ABNORMAL HIGH (ref 1.5–6.5)
NEUT%: 80 % — AB (ref 38.4–76.8)
Platelets: 395 10*3/uL (ref 145–400)
RBC: 3.93 10*6/uL (ref 3.70–5.45)
RDW: 17.8 % — ABNORMAL HIGH (ref 11.2–14.5)
WBC: 9.7 10*3/uL (ref 3.9–10.3)
lymph#: 1.5 10*3/uL (ref 0.9–3.3)

## 2014-08-27 LAB — COMPREHENSIVE METABOLIC PANEL (CC13)
ALK PHOS: 114 U/L (ref 40–150)
ALT: 35 U/L (ref 0–55)
AST: 40 U/L — AB (ref 5–34)
Albumin: 2.9 g/dL — ABNORMAL LOW (ref 3.5–5.0)
Anion Gap: 10 mEq/L (ref 3–11)
BUN: 6.7 mg/dL — AB (ref 7.0–26.0)
CO2: 26 mEq/L (ref 22–29)
Calcium: 9.5 mg/dL (ref 8.4–10.4)
Chloride: 102 mEq/L (ref 98–109)
Creatinine: 0.8 mg/dL (ref 0.6–1.1)
Glucose: 97 mg/dl (ref 70–140)
Potassium: 3 mEq/L — CL (ref 3.5–5.1)
Sodium: 138 mEq/L (ref 136–145)
Total Bilirubin: 0.49 mg/dL (ref 0.20–1.20)
Total Protein: 7.4 g/dL (ref 6.4–8.3)

## 2014-08-27 MED ORDER — SODIUM CHLORIDE 0.9 % IV SOLN
1.4000 mg/m2 | Freq: Once | INTRAVENOUS | Status: AC
  Administered 2014-08-27: 2.85 mg via INTRAVENOUS
  Filled 2014-08-27: qty 5.7

## 2014-08-27 MED ORDER — SODIUM CHLORIDE 0.9 % IJ SOLN
10.0000 mL | INTRAMUSCULAR | Status: DC | PRN
  Administered 2014-08-27: 10 mL
  Filled 2014-08-27: qty 10

## 2014-08-27 MED ORDER — DEXAMETHASONE SODIUM PHOSPHATE 10 MG/ML IJ SOLN
INTRAMUSCULAR | Status: AC
  Filled 2014-08-27: qty 1

## 2014-08-27 MED ORDER — HEPARIN SOD (PORK) LOCK FLUSH 100 UNIT/ML IV SOLN
500.0000 [IU] | Freq: Once | INTRAVENOUS | Status: AC | PRN
  Administered 2014-08-27: 500 [IU]
  Filled 2014-08-27: qty 5

## 2014-08-27 MED ORDER — ONDANSETRON 8 MG/NS 50 ML IVPB
INTRAVENOUS | Status: AC
  Filled 2014-08-27: qty 8

## 2014-08-27 MED ORDER — ONDANSETRON 8 MG/50ML IVPB (CHCC)
8.0000 mg | Freq: Once | INTRAVENOUS | Status: AC
  Administered 2014-08-27: 8 mg via INTRAVENOUS

## 2014-08-27 MED ORDER — SODIUM CHLORIDE 0.9 % IV SOLN
Freq: Once | INTRAVENOUS | Status: AC
  Administered 2014-08-27: 12:00:00 via INTRAVENOUS

## 2014-08-27 MED ORDER — DEXAMETHASONE SODIUM PHOSPHATE 10 MG/ML IJ SOLN
10.0000 mg | Freq: Once | INTRAMUSCULAR | Status: AC
  Administered 2014-08-27: 10 mg via INTRAVENOUS

## 2014-08-27 NOTE — Patient Instructions (Addendum)
[facility] Discharge Instructions for Patients Receiving Chemotherapy  Today you received the following chemotherapy agents: [place].  To help prevent nausea and vomiting after your treatment, we encourage you to take your nausea medication as prescribed.   If you develop nausea and vomiting that is not controlled by your nausea medication, call the clinic.   BELOW ARE SYMPTOMS THAT SHOULD BE REPORTED IMMEDIATELY:  *FEVER GREATER THAN 100.5 F  *CHILLS WITH OR WITHOUT FEVER  NAUSEA AND VOMITING THAT IS NOT CONTROLLED WITH YOUR NAUSEA MEDICATION  *UNUSUAL SHORTNESS OF BREATH  *UNUSUAL BRUISING OR BLEEDING  TENDERNESS IN MOUTH AND THROAT WITH OR WITHOUT PRESENCE OF ULCERS  *URINARY PROBLEMS  *BOWEL PROBLEMS  UNUSUAL RASH Items with * indicate a potential emergency and should be followed up as soon as possible.  Feel free to call the clinic you have any questions or concerns. The clinic phone number is [phone].   Hypokalemia Hypokalemia means that the amount of potassium in the blood is lower than normal.Potassium is a chemical, called an electrolyte, that helps regulate the amount of fluid in the body. It also stimulates muscle contraction and helps nerves function properly.Most of the body's potassium is inside of cells, and only a very small amount is in the blood. Because the amount in the blood is so small, minor changes can be life-threatening. CAUSES  Antibiotics.  Diarrhea or vomiting.  Using laxatives too much, which can cause diarrhea.  Chronic kidney disease.  Water pills (diuretics).  Eating disorders (bulimia).  Low magnesium level.  Sweating a lot. SIGNS AND SYMPTOMS  Weakness.  Constipation.  Fatigue.  Muscle cramps.  Mental confusion.  Skipped heartbeats or irregular heartbeat (palpitations).  Tingling or numbness. DIAGNOSIS  Your health care provider can diagnose hypokalemia with blood tests. In addition to  checking your potassium level, your health care provider may also check other lab tests. TREATMENT Hypokalemia can be treated with potassium supplements taken by mouth or adjustments in your current medicines. If your potassium level is very low, you may need to get potassium through a vein (IV) and be monitored in the hospital. A diet high in potassium is also helpful. Foods high in potassium are:  Nuts, such as peanuts and pistachios.  Seeds, such as sunflower seeds and pumpkin seeds.  Peas, lentils, and lima beans.  Whole grain and bran cereals and breads.  Fresh fruit and vegetables, such as apricots, avocado, bananas, cantaloupe, kiwi, oranges, tomatoes, asparagus, and potatoes.  Orange and tomato juices.  Red meats.  Fruit yogurt. HOME CARE INSTRUCTIONS  Take all medicines as prescribed by your health care provider.  Maintain a healthy diet by including nutritious food, such as fruits, vegetables, nuts, whole grains, and lean meats.  If you are taking a laxative, be sure to follow the directions on the label. SEEK MEDICAL CARE IF:  Your weakness gets worse.  You feel your heart pounding or racing.  You are vomiting or having diarrhea.  You are diabetic and having trouble keeping your blood glucose in the normal range. SEEK IMMEDIATE MEDICAL CARE IF:  You have chest pain, shortness of breath, or dizziness.  You are vomiting or having diarrhea for more than 2 days.  You faint. MAKE SURE YOU:   Understand these instructions.  Will watch your condition.  Will get help right away if you are not doing well or get worse. Document Released: 11/29/2005 Document Revised: 09/19/2013 Document Reviewed: 06/01/2013 [facility] Patient Information 2015 [place], [place]. This information  is not intended to replace advice given to you by your health care provider. Make sure you discuss any questions you have with your health care provider.  

## 2014-08-27 NOTE — Progress Notes (Signed)
Dr. Jana Hakim notified of pt's potassium level of 3.0 and that pt reports missing her dose yesterday.  MD states for pt to continue taking same dose of potassium supplement twice daily as ordered. Pt verbalized understanding and states she will work on taking the supplements everyday as ordered.

## 2014-09-03 ENCOUNTER — Ambulatory Visit (HOSPITAL_COMMUNITY)
Admission: RE | Admit: 2014-09-03 | Discharge: 2014-09-03 | Disposition: A | Discharge status: Home / Self Care | Payer: PRIVATE HEALTH INSURANCE | Source: Ambulatory Visit | Attending: Nurse Practitioner | Admitting: Nurse Practitioner

## 2014-09-03 DIAGNOSIS — Z9221 Personal history of antineoplastic chemotherapy: Secondary | ICD-10-CM | POA: Insufficient documentation

## 2014-09-03 DIAGNOSIS — C50219 Malignant neoplasm of upper-inner quadrant of unspecified female breast: Secondary | ICD-10-CM | POA: Diagnosis not present

## 2014-09-03 DIAGNOSIS — C50912 Malignant neoplasm of unspecified site of left female breast: Secondary | ICD-10-CM

## 2014-09-03 DIAGNOSIS — C50212 Malignant neoplasm of upper-inner quadrant of left female breast: Secondary | ICD-10-CM

## 2014-09-03 DIAGNOSIS — C800 Disseminated malignant neoplasm, unspecified: Secondary | ICD-10-CM | POA: Insufficient documentation

## 2014-09-03 DIAGNOSIS — R109 Unspecified abdominal pain: Secondary | ICD-10-CM | POA: Diagnosis not present

## 2014-09-03 DIAGNOSIS — Z9010 Acquired absence of unspecified breast and nipple: Secondary | ICD-10-CM | POA: Insufficient documentation

## 2014-09-03 MED ORDER — IOHEXOL 300 MG/ML  SOLN
100.0000 mL | Freq: Once | INTRAMUSCULAR | Status: AC | PRN
  Administered 2014-09-03: 100 mL via INTRAVENOUS

## 2014-09-06 ENCOUNTER — Ambulatory Visit (HOSPITAL_BASED_OUTPATIENT_CLINIC_OR_DEPARTMENT_OTHER): Payer: No Typology Code available for payment source | Admitting: Oncology

## 2014-09-06 ENCOUNTER — Telehealth: Payer: Self-pay | Admitting: Oncology

## 2014-09-06 ENCOUNTER — Telehealth: Payer: Self-pay | Admitting: *Deleted

## 2014-09-06 VITALS — BP 100/70 | HR 97 | Temp 98.3°F | Resp 18 | Ht 65.5 in | Wt 199.1 lb

## 2014-09-06 DIAGNOSIS — C50219 Malignant neoplasm of upper-inner quadrant of unspecified female breast: Secondary | ICD-10-CM

## 2014-09-06 DIAGNOSIS — C50212 Malignant neoplasm of upper-inner quadrant of left female breast: Secondary | ICD-10-CM

## 2014-09-06 DIAGNOSIS — C787 Secondary malignant neoplasm of liver and intrahepatic bile duct: Secondary | ICD-10-CM

## 2014-09-06 DIAGNOSIS — C773 Secondary and unspecified malignant neoplasm of axilla and upper limb lymph nodes: Secondary | ICD-10-CM

## 2014-09-06 DIAGNOSIS — T451X5A Adverse effect of antineoplastic and immunosuppressive drugs, initial encounter: Secondary | ICD-10-CM

## 2014-09-06 DIAGNOSIS — G609 Hereditary and idiopathic neuropathy, unspecified: Secondary | ICD-10-CM

## 2014-09-06 DIAGNOSIS — G620 Drug-induced polyneuropathy: Secondary | ICD-10-CM

## 2014-09-06 DIAGNOSIS — C50919 Malignant neoplasm of unspecified site of unspecified female breast: Secondary | ICD-10-CM

## 2014-09-06 DIAGNOSIS — Z171 Estrogen receptor negative status [ER-]: Secondary | ICD-10-CM

## 2014-09-06 DIAGNOSIS — E876 Hypokalemia: Secondary | ICD-10-CM

## 2014-09-06 DIAGNOSIS — C7800 Secondary malignant neoplasm of unspecified lung: Secondary | ICD-10-CM

## 2014-09-06 DIAGNOSIS — I82409 Acute embolism and thrombosis of unspecified deep veins of unspecified lower extremity: Secondary | ICD-10-CM

## 2014-09-06 DIAGNOSIS — Z86718 Personal history of other venous thrombosis and embolism: Secondary | ICD-10-CM

## 2014-09-06 NOTE — Telephone Encounter (Signed)
, °

## 2014-09-06 NOTE — Progress Notes (Signed)
ID: Erika [name] OB: 1971/12/08  MR#: [id number]  CSN#:[id number]  PCP: [name], MD GYN:   SU: Dr. [name] OTHER MD:  CHIEF COMPLAINT:  Newly metastatic breast cancer CURRENT TREATMENT: Eribulin, goserelin   BREAST CANCER HISTORY: From Dr. [name] original intake note:  "Patient developed a left sided breast mass show up about 6 months prior to diagnosis. 2 months prior this began getting larger rapidly. She was seen in an urgent care where this mass was thought to be an abscess. Apparently there was an attempt to excise or drain this that was not successful and she was referred to Dr. [name] for evaluation. She underwent punch biopsy on 04/23/13 of her skin and the mass. She was then also sent for mm/us. The biopsy shows invasive high grade carcinoma that appears to be breast. It is ER 14%, PR negative, HER-2/neu negative with a Ki-67 that approaches 100%. She has also undergone mm with at least 10 cm upper inner left breast tumor with mild overyling skin thickening. There is also a 3x10 mm cluster of calcs near there also. There is indeterminate 7 mm level 1 left axillary node, she underwent a lymph node biopsy that was positive for disease. No mm evidence of right breast malignancy. An MRI of the breasts was done on 05/22/13 and showed a 14 cm mass in the left breast protruding from the skin and also a 2.5 cm level II axillary node."  Her subsequent history is as detailed below  INTERVAL HISTORY:   Erika [name] returns today for followup of her breast cancer, accompanied by her significant other [name]. Today is day 8 cycle 3 of 6 planned eribulin cycles, given days 1 and 8 and repeated every 21 days.. She also receives goserelin every 4 weeks.   REVIEW OF SYSTEMS:  Erika [name] denies unusual headaches, nausea, vomiting, dizziness, or gait imbalance. She has had some blurred vision and is having a little bit of difficulty reading as a result. This is likely due to and  accommodation problems from the pre-meds. She feels great days 1 through 3 after a dose of eribulin, then slows down. However she is walking 20-30 minutes every day. She continues to have numbness and tingling in her toes and fingertips, but this has not changed from baseline. Shortly before her recent scans she was experiencing pain in her left chest wall, but that resolved. She had some [name] bowel movements from the oral contrast but that also has resolved. A detailed review of systems today was otherwise noncontributory.   PAST MEDICAL HISTORY: Past Medical History  Diagnosis Date  . Breast cancer     Invasive High Grade Carcinoma  . Headache(784.0)     Hx: of Migraines  . Status post chemotherapy     Adriamycin and Cytoxan  . Anxiety     ANXIOUS ABOUT HAVEING BIG SURGERY - MASTECTOMY  . 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 DVT (deep venous thrombosis)     RT ARM-PT ON LOVENOX  . Hx of iron deficiency anemia 11/26/13  . S/P radiation therapy 02-04-14 to 03-18-14                               1) Left Chest Wall / 50 Gy in 25 fractions/ 2) Left Supraclavicular fossa/ 46 Gy in 23 fractions / 3) Left Posterior Axillary boost / 8.533 Gy in 23 fractions/ 4) Left Chest Wall Scar boost /  10 Gy in 5 fractions    PAST SURGICAL HISTORY: Past Surgical History  Procedure Laterality Date  . Tubal ligation    . Portacath placement Right 05/22/2013    Procedure: INSERTION PORT-A-CATH;  Surgeon: [name], MD;  Location: [facility];  Service: General;  Laterality: Right;  . Left breast needle core biopsy Left 05/21/13  . Left breast needle core  biopsy Left 06/22/13  . Mastectomy modified radical Left 11/15/2013    Procedure: LEFT MASTECTOMY MODIFIED RADICAL;  Surgeon: [name], MD;  Location: [facility];  Service: General;  Laterality: Left;    FAMILY HISTORY Family History  Problem Relation Age of Onset  . Hypertension Mother   . Diabetes Father 13  . Stomach cancer Maternal Aunt    . Hypertension Maternal Grandmother   . Hypertension Maternal Grandfather   . Diabetes Paternal Grandmother   . Diabetes Paternal Grandfather   . Diabetes Paternal Uncle   . Diabetes Paternal Uncle   . Diabetes Paternal Uncle   The patient's mother is alive, age 52. The patient's father died from complications of diabetes at the age of 21. The patient has 2 brothers, one sister. There is no history of breast or ovarian cancer in the family to her knowledge .the patient has not undergone genetic testing   GYNECOLOGIC HISTORY:  Menarche age 60, first live birth age 54. The patient is GX P3. The patient has been on goserelin since 06/13/2013, given monthly  SOCIAL HISTORY:  The patient has worked at as a Furniture conservator/restorer and a Retail buyer. She is currently applying for disability. At home she lives with her youngest son, Erika [name], 9; with [name]; and with the patient's fiance [name].   ADVANCED DIRECTIVES:   HEALTH MAINTENANCE: History  Substance Use Topics  . Smoking status: Never Smoker   . Smokeless tobacco: Never Used  . Alcohol Use: No     Mammogram: 06/22/2013 Colonoscopy: Bone Density Scan:  Pap Smear:  Eye Exam:  Vitamin D Level:   Lipid Panel:    Allergies  Allergen Reactions  . Doxycycline Nausea And Vomiting    Current Outpatient Prescriptions  Medication Sig Dispense Refill  . cyclobenzaprine (FLEXERIL) 5 MG tablet TAKE 1 TABLET BY MOUTH THREE TIMES A DAY FOR MUSCLE SPASMS  30 tablet  1  . dexamethasone (DECADRON) 4 MG tablet Take 2 tablets (8 mg total) by mouth 2 (two) times daily with a meal. Start the day after chemotherapy for 2 days. Take with food.  30 tablet  1  . fentaNYL (DURAGESIC - DOSED MCG/HR) 25 MCG/HR patch Place 1 patch (25 mcg total) onto the skin every 3 (three) days.  10 patch  0  . fentaNYL (DURAGESIC - DOSED MCG/HR) 75 MCG/HR Place 1 patch (75 mcg total) onto the skin every 3 (three) days.  10 patch  0  .  gabapentin (NEURONTIN) 300 MG capsule Take 1 capsule (300 mg total) by mouth 3 (three) times daily.  90 capsule  4  . ibuprofen (ADVIL,MOTRIN) 200 MG tablet Take 600-800 mg by mouth every 4 (four) hours as needed for mild pain or moderate pain.       Marland Kitchen lidocaine-prilocaine (EMLA) cream Apply 1 application topically as needed. Apply over port area 1-2 hours before chemo, then cover with plastic wrap  30 g  0  . loratadine (CLARITIN) 10 MG tablet Take 10 mg by mouth daily as needed for allergies.      Marland Kitchen LORazepam (ATIVAN)  0.5 MG tablet Take 0.5 mg by mouth every 6 (six) hours as needed for anxiety.      . ondansetron (ZOFRAN) 8 MG tablet Take 1 tablet (8 mg total) by mouth 2 (two) times daily. Start the day after chemo for 2 days. Then take as needed for nausea or vomiting.  30 tablet  1  . oxyCODONE (OXY IR/ROXICODONE) 5 MG immediate release tablet Take 1 tablet (5 mg total) by mouth every 12 (twelve) hours as needed for severe pain or breakthrough pain.  60 tablet  0  . potassium chloride SA (K-DUR,KLOR-CON) 20 MEQ tablet Take 1 tablet (20 mEq total) by mouth daily.  7 tablet  0  . PRESCRIPTION MEDICATION PT REPORTS RECEIVING A SHOT ONCE A MONTH AT CANCER CENTER TO STOP HER MENSTRUAL PERIODS - I SEE DOCUMENTATION IN OFFICE NOTES FROM CANCER CENTER THAT PT WAS GETTING ZOLADEX SQ EVERY 28 DAYS.      Marland Kitchen prochlorperazine (COMPAZINE) 10 MG tablet Take 1 tablet (10 mg total) by mouth every 6 (six) hours as needed (Nausea or vomiting).  30 tablet  1  . traMADol (ULTRAM) 50 MG tablet Take 1-2 tablets (50-100 mg total) by mouth every 6 (six) hours as needed.  30 tablet  1  . UNABLE TO FIND Rx: L8000- Post Surgical Bra (Quantity: 6) U8828- Non Silicone Breast prosthesis (Quantity: 1) Dx: 174.9; Left mastectomy  1 each  0  . zolpidem (AMBIEN) 5 MG tablet Take 1 tablet (5 mg total) by mouth at bedtime as needed for sleep.  30 tablet  0   No current facility-administered medications for this visit.    Facility-Administered Medications Ordered in Other Visits  Medication Dose Route Frequency Provider Last Rate Last Dose  . topical emolient (BIAFINE) emulsion   Topical Daily [name], MD        OBJECTIVE: middle-aged African American woman  Filed Vitals:   09/06/14 1137  BP: 100/70  Pulse: 97  Temp: 98.3 F (36.8 C)  Resp: 18     Body mass index is 32.62 kg/(m^2).      ECOG FS:1 - Symptomatic but completely ambulatory  Sclerae unicteric, pupils round and equal Oropharynx clear and moist No cervical or supraclavicular adenopathy Lungs no rales or rhonchi Heart regular rate and rhythm Abd soft, nontender, positive bowel sounds MSK no focal spinal tenderness, no upper extremity lymphedema Neuro: nonfocal, well oriented, positive affect Breasts: The right breast is unremarkable. The left breast status post mastectomy and radiation. There is significant hyperpigmentation and still some desquamation over the radiation port area. There is no evidence of local recurrence. The axilla is benign.   LAB RESULTS:  CMP     Component Value Date/Time   NA 138 08/27/2014 1049   NA 138 11/16/2013 0448   K 3.0* 08/27/2014 1049   K 3.5 11/16/2013 0448   CL 103 11/16/2013 0448   CL 102 06/01/2013 1500   CO2 26 08/27/2014 1049   CO2 27 11/16/2013 0448   GLUCOSE 97 08/27/2014 1049   GLUCOSE 116* 11/16/2013 0448   GLUCOSE 95 06/01/2013 1500   BUN 6.7* 08/27/2014 1049   BUN 8 11/16/2013 0448   CREATININE 0.8 08/27/2014 1049   CREATININE 0.86 11/16/2013 0448   CALCIUM 9.5 08/27/2014 1049   CALCIUM 8.9 11/16/2013 0448   PROT 7.4 08/27/2014 1049   PROT 8.2 05/18/2013 1205   ALBUMIN 2.9* 08/27/2014 1049   ALBUMIN 2.8* 05/18/2013 1205   AST 40* 08/27/2014 1049   AST  18 05/18/2013 1205   ALT 35 08/27/2014 1049   ALT 16 05/18/2013 1205   ALKPHOS 114 08/27/2014 1049   ALKPHOS 111 05/18/2013 1205   BILITOT 0.49 08/27/2014 1049   BILITOT 0.2* 05/18/2013 1205   GFRNONAA 82* 11/16/2013 0448   GFRAA >90 11/16/2013 0448     I No results found for this basename: SPEP,  UPEP,   kappa and lambda light chains    Lab Results  Component Value Date   WBC 9.7 08/27/2014   NEUTROABS 7.7* 08/27/2014   HGB 10.3* 08/27/2014   HCT 32.2* 08/27/2014   MCV 81.8 08/27/2014   PLT 395 08/27/2014      Chemistry      Component Value Date/Time   NA 138 08/27/2014 1049   NA 138 11/16/2013 0448   K 3.0* 08/27/2014 1049   K 3.5 11/16/2013 0448   CL 103 11/16/2013 0448   CL 102 06/01/2013 1500   CO2 26 08/27/2014 1049   CO2 27 11/16/2013 0448   BUN 6.7* 08/27/2014 1049   BUN 8 11/16/2013 0448   CREATININE 0.8 08/27/2014 1049   CREATININE 0.86 11/16/2013 0448      Component Value Date/Time   CALCIUM 9.5 08/27/2014 1049   CALCIUM 8.9 11/16/2013 0448   ALKPHOS 114 08/27/2014 1049   ALKPHOS 111 05/18/2013 1205   AST 40* 08/27/2014 1049   AST 18 05/18/2013 1205   ALT 35 08/27/2014 1049   ALT 16 05/18/2013 1205   BILITOT 0.49 08/27/2014 1049   BILITOT 0.2* 05/18/2013 1205       Lab Results  Component Value Date   LABCA2 35 05/18/2013    No components found with this basename: [id number]    No results found for this basename: INR,  in the last 168 hours  Urinalysis No results found for this basename: colorurine,  appearanceur,  labspec,  phurine,  glucoseu,  hgbur,  bilirubinur,  ketonesur,  proteinur,  urobilinogen,  nitrite,  leukocytesur    STUDIES: Ct Chest W Contrast  09/03/2014   CLINICAL DATA:  Breast cancer. Prior chemotherapy and radiation. History of left mastectomy. Abdominal pain.  EXAM: CT CHEST, ABDOMEN, AND PELVIS WITH CONTRAST  TECHNIQUE: Multidetector CT imaging of the chest, abdomen and pelvis was performed following the standard protocol during bolus administration of intravenous contrast.  CONTRAST:  155m OMNIPAQUE IOHEXOL 300 MG/ML  SOLN  COMPARISON:  06/26/2014  FINDINGS: CT CHEST FINDINGS  Small right pleural effusion, significantly decreased since prior study. Soft tissue pleural masses again noted throughout the  pleural space, with index pleural lesion on image 36 measuring 2.3 x 1.0 cm on image 36, stable. Other pleural nodules noted and stable.  Right anterior cardiophrenic lymph nodes again noted with index node measuring up to 16 mm, stable since prior study.  Multiple pleural-based pulmonary nodules again noted. Anterior right upper lobe nodule has decreased in size measuring 11 x 8 mm compared with 14 x 9 mm previously. This is measured on image 20. Nodularity along the major fissure on image 26 is better seen on today's study due to decreasing right effusion, measuring 10 mm on today's study. Nodularity scratch has subpleural nodularity within the right upper lobe/ apex better seen due to the decreasing size of the right effusion. Medial nodule on image 20 tube measures 13 mm. This nodule was not seen on prior study.  Prior left mastectomy. Ill-defined airspace opacities noted anteriorly in the left lung underlying the mastectomy site, possibly postradiation changes. No focal  left lung nodule. No left effusion.  Heart is normal size. Aorta is normal caliber. No mediastinal, hilar, or axillary adenopathy.  Right Port-A-Cath remains in place, unchanged.  CT ABDOMEN AND PELVIS FINDINGS  Hepatic lesions have decreased in size. Lateral right hepatic lesion on image 56 measures up to 2.7 cm compared to 4.2 cm previously. Posterior right hepatic lesion on image 50 to measures up to 1.5 cm compared with 2.7 cm previously. No new liver lesions. Multiple gallstones fill the gallbladder. Spleen, pancreas, adrenals and kidneys are normal.  Appendix is visualized and is normal. Stomach, large and small bowel unremarkable. Uterus and adnexa as well as urinary bladder grossly unremarkable. No free fluid, free air or adenopathy.  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 Decreasing right pleural effusion.  Subpleural pulmonary nodules in  the right lung have decreased in size. There is a single new subpleural lesion in the anterior medial right upper lobe.  Improving hepatic metastases. No new metastatic disease in the abdomen or pelvis.  Changes of left mastectomy. Probable postradiation changes in the anterior left lung.  Cholelithiasis.   Electronically Signed   By: Rolm Baptise M.D.   On: 09/03/2014 14:16   Ct Abdomen Pelvis W Contrast  09/03/2014   CLINICAL DATA:  Breast cancer. Prior chemotherapy and radiation. History of left mastectomy. Abdominal pain.  EXAM: CT CHEST, ABDOMEN, AND PELVIS WITH CONTRAST  TECHNIQUE: Multidetector CT imaging of the chest, abdomen and pelvis was performed following the standard protocol during bolus administration of intravenous contrast.  CONTRAST:  161m OMNIPAQUE IOHEXOL 300 MG/ML  SOLN  COMPARISON:  06/26/2014  FINDINGS: CT CHEST FINDINGS  Small right pleural effusion, significantly decreased since prior study. Soft tissue pleural masses again noted throughout the pleural space, with index pleural lesion on image 36 measuring 2.3 x 1.0 cm on image 36, stable. Other pleural nodules noted and stable.  Right anterior cardiophrenic lymph nodes again noted with index node measuring up to 16 mm, stable since prior study.  Multiple pleural-based pulmonary nodules again noted. Anterior right upper lobe nodule has decreased in size measuring 11 x 8 mm compared with 14 x 9 mm previously. This is measured on image 20. Nodularity along the major fissure on image 26 is better seen on today's study due to decreasing right effusion, measuring 10 mm on today's study. Nodularity scratch has subpleural nodularity within the right upper lobe/ apex better seen due to the decreasing size of the right effusion. Medial nodule on image 20 tube measures 13 mm. This nodule was not seen on prior study.  Prior left mastectomy. Ill-defined airspace opacities noted anteriorly in the left lung underlying the mastectomy site, possibly  postradiation changes. No focal left lung nodule. No left effusion.  Heart is normal size. Aorta is normal caliber. No mediastinal, hilar, or axillary adenopathy.  Right Port-A-Cath remains in place, unchanged.  CT ABDOMEN AND PELVIS FINDINGS  Hepatic lesions have decreased in size. Lateral right hepatic lesion on image 56 measures up to 2.7 cm compared to 4.2 cm previously. Posterior right hepatic lesion on image 50 to measures up to 1.5 cm compared with 2.7 cm previously. No new liver lesions. Multiple gallstones fill the gallbladder. Spleen, pancreas, adrenals and kidneys are normal.  Appendix is visualized and is normal. Stomach, large and small bowel unremarkable. Uterus and adnexa as well as urinary bladder grossly unremarkable. No free fluid, free air or  adenopathy.  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 Decreasing right pleural effusion.  Subpleural pulmonary nodules in the right lung have decreased in size. There is a single new subpleural lesion in the anterior medial right upper lobe.  Improving hepatic metastases. No new metastatic disease in the abdomen or pelvis.  Changes of left mastectomy. Probable postradiation changes in the anterior left lung.  Cholelithiasis.   Electronically Signed   By: Rolm Baptise M.D.   On: 09/03/2014 14:16  ASSESSMENT: 43 y.o.  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  (0) genetic testing of the ATM, BARD1, BRCA1, BRCA2, BRIP1, CDH1, CHEK2, EPCAM, FANCC, MLH1, MSH2, MSH6, NBN, PALB2, PMS2, PTEN, RAD51C, RAD51D, STK11, TP53, and XRCC2 genes showed no deleterious mutations  (1) status post left breast biopsy 04/23/2013 for a clinical T4 N1, stage IIIB invasive ductal carcinoma, grade 3,  estrogen receptor 14% positive with moderate staining intensity, progesterone receptor negative,  with an MIB-1 of  100% and HER-2/neu   nonamplified  (2) biopsy of a left axillary lymph node [phone] was positive  (3) started monthly goserelin July of 02/01/2013  (4)  received  neoadjuvant chemotherapy with doxorubicin and cyclophosphamide in dose dense fashion x4, followed by carboplatin and paclitaxel x1, discontinued due to neuropathy, followed by carboplatin and gemcitabine, both given day 1 of each 14 day cycle x6, completed 10/12/2013 .   (5) right upper extremity DVT documented 06/29/2013,  treated with Lovenox from  July  through  December 2014    (6) status post left modified radical mastectomy 11/15/2013 for a residual pT3 pN0 invasive ductal carcinoma, grade 3, with repeat prognostic panel triple negative. All 12 axillary lymph nodes were clear. Margins were negative   (5) completed adjuvant radiation therapy 03/18/2014,  with capecitabine added 2-3 weeks into treatment.  METASTATIC DISEASE  (6) CT scans of the chest, abdomen, and pelvis 06/26/2014 showed a large right pleural effusion  with pleural studding, 2 left upper lobe nodules, and two  liver lesions. 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 There is one new lung spot of uncertain significance but otherwise there is either stable or improving disease.  She does have baseline peripheral neuropathy from her prior treatments, but this has not worsened with the eribulin. She is very aware of that symptom and will let us know if it worsens.  Otherwise the plan is to proceed to 3 more cycles of eribulin, given days 1 and 8 of each 21 day cycle.  I have entered those orders as well as the appropriate visits a lapse.  When she completes the next 3 cycles she will see me again, specifically November 24. The day before she will have restaging CT scans.   Shaunita understands the goal of treatment in her case is control. She agrees with the plan. She has been encouraged to call with any issues that might arise before her next visit here.   [name], MD  09/06/2014 11:44 AM

## 2014-09-06 NOTE — Telephone Encounter (Signed)
Per staff message and POF I have scheduled appts. Advised scheduler of appts. JMW  

## 2014-09-09 ENCOUNTER — Telehealth: Payer: Self-pay | Admitting: Adult Health

## 2014-09-09 NOTE — Telephone Encounter (Signed)
, °

## 2014-09-10 ENCOUNTER — Other Ambulatory Visit (HOSPITAL_BASED_OUTPATIENT_CLINIC_OR_DEPARTMENT_OTHER): Payer: No Typology Code available for payment source

## 2014-09-10 ENCOUNTER — Ambulatory Visit (HOSPITAL_BASED_OUTPATIENT_CLINIC_OR_DEPARTMENT_OTHER): Payer: No Typology Code available for payment source

## 2014-09-10 ENCOUNTER — Other Ambulatory Visit: Payer: Self-pay | Admitting: *Deleted

## 2014-09-10 VITALS — BP 110/69 | HR 95 | Temp 98.2°F | Resp 18

## 2014-09-10 DIAGNOSIS — E876 Hypokalemia: Secondary | ICD-10-CM

## 2014-09-10 DIAGNOSIS — C50212 Malignant neoplasm of upper-inner quadrant of left female breast: Secondary | ICD-10-CM

## 2014-09-10 DIAGNOSIS — C50912 Malignant neoplasm of unspecified site of left female breast: Secondary | ICD-10-CM

## 2014-09-10 DIAGNOSIS — C50219 Malignant neoplasm of upper-inner quadrant of unspecified female breast: Secondary | ICD-10-CM

## 2014-09-10 DIAGNOSIS — Z5111 Encounter for antineoplastic chemotherapy: Secondary | ICD-10-CM

## 2014-09-10 LAB — CBC WITH DIFFERENTIAL/PLATELET
BASO%: 0.6 % (ref 0.0–2.0)
Basophils Absolute: 0 10*3/uL (ref 0.0–0.1)
EOS%: 0.2 % (ref 0.0–7.0)
Eosinophils Absolute: 0 10*3/uL (ref 0.0–0.5)
HCT: 31.4 % — ABNORMAL LOW (ref 34.8–46.6)
HGB: 9.8 g/dL — ABNORMAL LOW (ref 11.6–15.9)
LYMPH%: 19.3 % (ref 14.0–49.7)
MCH: 25.9 pg (ref 25.1–34.0)
MCHC: 31.4 g/dL — AB (ref 31.5–36.0)
MCV: 82.6 fL (ref 79.5–101.0)
MONO#: 0.5 10*3/uL (ref 0.1–0.9)
MONO%: 7.1 % (ref 0.0–14.0)
NEUT#: 4.8 10*3/uL (ref 1.5–6.5)
NEUT%: 72.8 % (ref 38.4–76.8)
Platelets: 440 10*3/uL — ABNORMAL HIGH (ref 145–400)
RBC: 3.8 10*6/uL (ref 3.70–5.45)
RDW: 19.9 % — AB (ref 11.2–14.5)
WBC: 6.6 10*3/uL (ref 3.9–10.3)
lymph#: 1.3 10*3/uL (ref 0.9–3.3)

## 2014-09-10 LAB — COMPREHENSIVE METABOLIC PANEL (CC13)
ALT: 13 U/L (ref 0–55)
AST: 16 U/L (ref 5–34)
Albumin: 2.7 g/dL — ABNORMAL LOW (ref 3.5–5.0)
Alkaline Phosphatase: 120 U/L (ref 40–150)
Anion Gap: 5 mEq/L (ref 3–11)
BILIRUBIN TOTAL: 0.46 mg/dL (ref 0.20–1.20)
BUN: 4.6 mg/dL — ABNORMAL LOW (ref 7.0–26.0)
CHLORIDE: 104 meq/L (ref 98–109)
CO2: 29 mEq/L (ref 22–29)
Calcium: 9.8 mg/dL (ref 8.4–10.4)
Creatinine: 0.8 mg/dL (ref 0.6–1.1)
Glucose: 82 mg/dl (ref 70–140)
Potassium: 3.4 mEq/L — ABNORMAL LOW (ref 3.5–5.1)
SODIUM: 139 meq/L (ref 136–145)
TOTAL PROTEIN: 7.5 g/dL (ref 6.4–8.3)

## 2014-09-10 MED ORDER — SODIUM CHLORIDE 0.9 % IV SOLN
Freq: Once | INTRAVENOUS | Status: AC
  Administered 2014-09-10: 15:00:00 via INTRAVENOUS

## 2014-09-10 MED ORDER — ONDANSETRON 8 MG/50ML IVPB (CHCC)
8.0000 mg | Freq: Once | INTRAVENOUS | Status: AC
Start: 2014-09-10 — End: 2014-09-10
  Administered 2014-09-10: 8 mg via INTRAVENOUS

## 2014-09-10 MED ORDER — OXYCODONE HCL 5 MG PO TABS: 5.0000 mg | ORAL_TABLET | Freq: Two times a day (BID) | ORAL | Status: DC | PRN

## 2014-09-10 MED ORDER — SODIUM CHLORIDE 0.9 % IJ SOLN
10.0000 mL | INTRAMUSCULAR | Status: DC | PRN
  Administered 2014-09-10: 10 mL
  Filled 2014-09-10: qty 10

## 2014-09-10 MED ORDER — DEXAMETHASONE SODIUM PHOSPHATE 10 MG/ML IJ SOLN
INTRAMUSCULAR | Status: AC
  Filled 2014-09-10: qty 1

## 2014-09-10 MED ORDER — ERIBULIN MESYLATE CHEMO INJECTION 1 MG/2ML
1.4000 mg/m2 | Freq: Once | INTRAVENOUS | Status: AC
  Administered 2014-09-10: 2.85 mg via INTRAVENOUS
  Filled 2014-09-10: qty 5.7

## 2014-09-10 MED ORDER — ONDANSETRON 8 MG/NS 50 ML IVPB
INTRAVENOUS | Status: AC
  Filled 2014-09-10: qty 8

## 2014-09-10 MED ORDER — OXYCODONE-ACETAMINOPHEN 5-325 MG PO TABS
ORAL_TABLET | ORAL | Status: AC
  Filled 2014-09-10: qty 2

## 2014-09-10 MED ORDER — DEXAMETHASONE SODIUM PHOSPHATE 10 MG/ML IJ SOLN
10.0000 mg | Freq: Once | INTRAMUSCULAR | Status: AC
  Administered 2014-09-10: 10 mg via INTRAVENOUS

## 2014-09-10 MED ORDER — FENTANYL 25 MCG/HR TD PT72: 25.0000 ug | MEDICATED_PATCH | TRANSDERMAL | Status: DC

## 2014-09-10 MED ORDER — FENTANYL 75 MCG/HR TD PT72: 75.0000 ug | MEDICATED_PATCH | TRANSDERMAL | Status: DC

## 2014-09-10 MED ORDER — OXYCODONE-ACETAMINOPHEN 5-325 MG PO TABS
2.0000 | ORAL_TABLET | Freq: Once | ORAL | Status: AC
Start: 2014-09-10 — End: 2014-09-10
  Administered 2014-09-10: 2 via ORAL

## 2014-09-10 MED ORDER — HEPARIN SOD (PORK) LOCK FLUSH 100 UNIT/ML IV SOLN
500.0000 [IU] | Freq: Once | INTRAVENOUS | Status: AC | PRN
  Administered 2014-09-10: 500 [IU]
  Filled 2014-09-10: qty 5

## 2014-09-10 NOTE — Patient Instructions (Signed)
[facility] Discharge Instructions for Patients Receiving Chemotherapy  Today you received the following chemotherapy agents: [place].  To help prevent nausea and vomiting after your treatment, we encourage you to take your nausea medication as prescribed.   If you develop nausea and vomiting that is not controlled by your nausea medication, call the clinic.   BELOW ARE SYMPTOMS THAT SHOULD BE REPORTED IMMEDIATELY:  *FEVER GREATER THAN 100.5 F  *CHILLS WITH OR WITHOUT FEVER  NAUSEA AND VOMITING THAT IS NOT CONTROLLED WITH YOUR NAUSEA MEDICATION  *UNUSUAL SHORTNESS OF BREATH  *UNUSUAL BRUISING OR BLEEDING  TENDERNESS IN MOUTH AND THROAT WITH OR WITHOUT PRESENCE OF ULCERS  *URINARY PROBLEMS  *BOWEL PROBLEMS  UNUSUAL RASH Items with * indicate a potential emergency and should be followed up as soon as possible.  Feel free to call the clinic you have any questions or concerns. The clinic phone number is [phone].    

## 2014-09-11 ENCOUNTER — Other Ambulatory Visit: Payer: Self-pay | Admitting: Emergency Medicine

## 2014-09-11 DIAGNOSIS — E876 Hypokalemia: Secondary | ICD-10-CM

## 2014-09-11 MED ORDER — POTASSIUM CHLORIDE CRYS ER 20 MEQ PO TBCR: 20.0000 meq | EXTENDED_RELEASE_TABLET | Freq: Three times a day (TID) | ORAL | Status: DC

## 2014-09-16 ENCOUNTER — Other Ambulatory Visit: Payer: Self-pay | Admitting: *Deleted

## 2014-09-16 DIAGNOSIS — C50212 Malignant neoplasm of upper-inner quadrant of left female breast: Secondary | ICD-10-CM

## 2014-09-17 ENCOUNTER — Ambulatory Visit: Payer: No Typology Code available for payment source

## 2014-09-17 ENCOUNTER — Encounter: Payer: Self-pay | Admitting: Adult Health

## 2014-09-17 ENCOUNTER — Ambulatory Visit (HOSPITAL_BASED_OUTPATIENT_CLINIC_OR_DEPARTMENT_OTHER): Payer: No Typology Code available for payment source

## 2014-09-17 ENCOUNTER — Other Ambulatory Visit (HOSPITAL_BASED_OUTPATIENT_CLINIC_OR_DEPARTMENT_OTHER): Payer: No Typology Code available for payment source

## 2014-09-17 ENCOUNTER — Ambulatory Visit (HOSPITAL_BASED_OUTPATIENT_CLINIC_OR_DEPARTMENT_OTHER): Payer: No Typology Code available for payment source | Admitting: Adult Health

## 2014-09-17 VITALS — BP 122/76 | HR 95 | Temp 98.2°F | Resp 18 | Ht 65.5 in | Wt 192.0 lb

## 2014-09-17 DIAGNOSIS — J910 Malignant pleural effusion: Secondary | ICD-10-CM

## 2014-09-17 DIAGNOSIS — C50212 Malignant neoplasm of upper-inner quadrant of left female breast: Secondary | ICD-10-CM

## 2014-09-17 DIAGNOSIS — Z171 Estrogen receptor negative status [ER-]: Secondary | ICD-10-CM

## 2014-09-17 DIAGNOSIS — G620 Drug-induced polyneuropathy: Secondary | ICD-10-CM

## 2014-09-17 DIAGNOSIS — E876 Hypokalemia: Secondary | ICD-10-CM

## 2014-09-17 DIAGNOSIS — C773 Secondary and unspecified malignant neoplasm of axilla and upper limb lymph nodes: Secondary | ICD-10-CM

## 2014-09-17 DIAGNOSIS — Z5111 Encounter for antineoplastic chemotherapy: Secondary | ICD-10-CM

## 2014-09-17 DIAGNOSIS — C50219 Malignant neoplasm of upper-inner quadrant of unspecified female breast: Secondary | ICD-10-CM

## 2014-09-17 LAB — COMPREHENSIVE METABOLIC PANEL (CC13)
ALT: 16 U/L (ref 0–55)
AST: 19 U/L (ref 5–34)
Albumin: 2.8 g/dL — ABNORMAL LOW (ref 3.5–5.0)
Alkaline Phosphatase: 113 U/L (ref 40–150)
Anion Gap: 8 mEq/L (ref 3–11)
BUN: 4.8 mg/dL — ABNORMAL LOW (ref 7.0–26.0)
CO2: 30 mEq/L — ABNORMAL HIGH (ref 22–29)
Calcium: 10 mg/dL (ref 8.4–10.4)
Chloride: 104 mEq/L (ref 98–109)
Creatinine: 0.7 mg/dL (ref 0.6–1.1)
Glucose: 109 mg/dl (ref 70–140)
Potassium: 3.2 mEq/L — ABNORMAL LOW (ref 3.5–5.1)
Sodium: 141 mEq/L (ref 136–145)
Total Bilirubin: 0.36 mg/dL (ref 0.20–1.20)
Total Protein: 7.6 g/dL (ref 6.4–8.3)

## 2014-09-17 LAB — CBC WITH DIFFERENTIAL/PLATELET
BASO%: 1.1 % (ref 0.0–2.0)
Basophils Absolute: 0.1 10*3/uL (ref 0.0–0.1)
EOS%: 0.2 % (ref 0.0–7.0)
Eosinophils Absolute: 0 10*3/uL (ref 0.0–0.5)
HEMATOCRIT: 32.5 % — AB (ref 34.8–46.6)
HGB: 10.2 g/dL — ABNORMAL LOW (ref 11.6–15.9)
LYMPH%: 17.8 % (ref 14.0–49.7)
MCH: 25.7 pg (ref 25.1–34.0)
MCHC: 31.3 g/dL — ABNORMAL LOW (ref 31.5–36.0)
MCV: 82.1 fL (ref 79.5–101.0)
MONO#: 0.2 10*3/uL (ref 0.1–0.9)
MONO%: 3.6 % (ref 0.0–14.0)
NEUT#: 4.5 10*3/uL (ref 1.5–6.5)
NEUT%: 77.3 % — AB (ref 38.4–76.8)
PLATELETS: 440 10*3/uL — AB (ref 145–400)
RBC: 3.95 10*6/uL (ref 3.70–5.45)
RDW: 20.5 % — ABNORMAL HIGH (ref 11.2–14.5)
WBC: 5.8 10*3/uL (ref 3.9–10.3)
lymph#: 1 10*3/uL (ref 0.9–3.3)

## 2014-09-17 MED ORDER — SODIUM CHLORIDE 0.9 % IV SOLN
1.4000 mg/m2 | Freq: Once | INTRAVENOUS | Status: AC
  Administered 2014-09-17: 2.85 mg via INTRAVENOUS
  Filled 2014-09-17: qty 5.7

## 2014-09-17 MED ORDER — ONDANSETRON 8 MG/50ML IVPB (CHCC)
8.0000 mg | Freq: Once | INTRAVENOUS | Status: AC
  Administered 2014-09-17: 8 mg via INTRAVENOUS

## 2014-09-17 MED ORDER — HEPARIN SOD (PORK) LOCK FLUSH 100 UNIT/ML IV SOLN
500.0000 [IU] | Freq: Once | INTRAVENOUS | Status: AC | PRN
  Administered 2014-09-17: 500 [IU]
  Filled 2014-09-17: qty 5

## 2014-09-17 MED ORDER — SODIUM CHLORIDE 0.9 % IJ SOLN
10.0000 mL | INTRAMUSCULAR | Status: DC | PRN
  Administered 2014-09-17: 10 mL
  Filled 2014-09-17: qty 10

## 2014-09-17 MED ORDER — DEXAMETHASONE SODIUM PHOSPHATE 10 MG/ML IJ SOLN
INTRAMUSCULAR | Status: AC
  Filled 2014-09-17: qty 1

## 2014-09-17 MED ORDER — DEXAMETHASONE SODIUM PHOSPHATE 10 MG/ML IJ SOLN
10.0000 mg | Freq: Once | INTRAMUSCULAR | Status: AC
  Administered 2014-09-17: 10 mg via INTRAVENOUS

## 2014-09-17 MED ORDER — GOSERELIN ACETATE 3.6 MG ~~LOC~~ IMPL
3.6000 mg | DRUG_IMPLANT | Freq: Once | SUBCUTANEOUS | Status: AC
  Filled 2014-09-17: qty 3.6

## 2014-09-17 MED ORDER — GOSERELIN ACETATE 3.6 MG ~~LOC~~ IMPL
3.6000 mg | DRUG_IMPLANT | Freq: Once | SUBCUTANEOUS | Status: AC
  Administered 2014-09-17: 3.6 mg via SUBCUTANEOUS
  Filled 2014-09-17: qty 3.6

## 2014-09-17 MED ORDER — SODIUM CHLORIDE 0.9 % IV SOLN
Freq: Once | INTRAVENOUS | Status: AC
  Administered 2014-09-17: 15:00:00 via INTRAVENOUS

## 2014-09-17 MED ORDER — ONDANSETRON 8 MG/NS 50 ML IVPB
INTRAVENOUS | Status: AC
  Filled 2014-09-17: qty 8

## 2014-09-17 NOTE — Progress Notes (Signed)
ID: Erika [name] OB: 01/06/1971  MR#: [id number]  CSN#:[id number]  PCP: [name], MD GYN:   SU: Dr. [name] OTHER MD:  CHIEF COMPLAINT:  Newly metastatic breast cancer CURRENT TREATMENT: Eribulin, goserelin   BREAST CANCER HISTORY: From Dr. [name] original intake note:  "Patient developed a left sided breast mass show up about 6 months prior to diagnosis. 2 months prior this began getting larger rapidly. She was seen in an urgent care where this mass was thought to be an abscess. Apparently there was an attempt to excise or drain this that was not successful and she was referred to Dr. [name] for evaluation. She underwent punch biopsy on 04/23/13 of her skin and the mass. She was then also sent for mm/us. The biopsy shows invasive high grade carcinoma that appears to be breast. It is ER 14%, PR negative, HER-2/neu negative with a Ki-67 that approaches 100%. She has also undergone mm with at least 10 cm upper inner left breast tumor with mild overyling skin thickening. There is also a 3x10 mm cluster of calcs near there also. There is indeterminate 7 mm level 1 left axillary node, she underwent a lymph node biopsy that was positive for disease. No mm evidence of right breast malignancy. An MRI of the breasts was done on 05/22/13 and showed a 14 cm mass in the left breast protruding from the skin and also a 2.5 cm level II axillary node."  Her subsequent history is as detailed below  INTERVAL HISTORY:   Erika [name] returns today for followup of her breast cancer, accompanied by her significant other Erika [name]. Today is day 8 cycle 4 of 6 planned eribulin cycles, given days 1 and 8 and repeated every 21 days. She also receives Zoladex every 4 weeks.   She is doing moderately well today.  She appears to be tolerating the eribulin well.  She has no new numbness.  She does have a slight decrease in her appetite and is still eating all her meals.  She is still walking, and  exercising.  She denies fevers, chills, nausea, vomiting, constipation, diarrhea, skin changes, or any further concerns.  She does have pain and is managed with Fentanyl patches and PRN oxycodone.    REVIEW OF SYSTEMS:  A 10 point review of systems was conducted and is otherwise negative except for what is noted above.     PAST MEDICAL HISTORY: Past Medical History  Diagnosis Date  . Breast cancer     Invasive High Grade Carcinoma  . Headache(784.0)     Hx: of Migraines  . Status post chemotherapy     Adriamycin and Cytoxan  . Anxiety     ANXIOUS ABOUT HAVEING BIG SURGERY - MASTECTOMY  . 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 DVT (deep venous thrombosis)     RT ARM-PT ON LOVENOX  . Hx of iron deficiency anemia 11/26/13  . S/P radiation therapy 02-04-14 to 03-18-14                               1) Left Chest Wall / 50 Gy in 25 fractions/ 2) Left Supraclavicular fossa/ 46 Gy in 23 fractions / 3) Left Posterior Axillary boost / 8.533 Gy in 23 fractions/ 4) Left Chest Wall Scar boost / 10 Gy in 5 fractions    PAST SURGICAL HISTORY: Past Surgical History  Procedure Laterality Date  . Tubal ligation    .  Portacath placement Right 05/22/2013    Procedure: INSERTION PORT-A-CATH;  Surgeon: [name], MD;  Location: [facility];  Service: General;  Laterality: Right;  . Left breast needle core biopsy Left 05/21/13  . Left breast needle core  biopsy Left 06/22/13  . Mastectomy modified radical Left 11/15/2013    Procedure: LEFT MASTECTOMY MODIFIED RADICAL;  Surgeon: [name], MD;  Location: [facility];  Service: General;  Laterality: Left;    FAMILY HISTORY Family History  Problem Relation Age of Onset  . Hypertension Mother   . Diabetes Father 83  . Stomach cancer Maternal Aunt   . Hypertension Maternal Grandmother   . Hypertension Maternal Grandfather   . Diabetes Paternal Grandmother   . Diabetes Paternal Grandfather   . Diabetes Paternal Uncle   . Diabetes Paternal Uncle    . Diabetes Paternal Uncle   The patient's mother is alive, age 91. The patient's father died from complications of diabetes at the age of 92. The patient has 2 brothers, one sister. There is no history of breast or ovarian cancer in the family to her knowledge .the patient has not undergone genetic testing   GYNECOLOGIC HISTORY:  Menarche age 15, first live birth age 8. The patient is GX P3. The patient has been on goserelin since 06/13/2013, given monthly  SOCIAL HISTORY:  The patient has worked at as a Furniture conservator/restorer and a Retail buyer. She is currently applying for disability. At home she lives with her youngest son, Erika [name], 21; with [name]; and with [name].   ADVANCED DIRECTIVES:   HEALTH MAINTENANCE: History  Substance Use Topics  . Smoking status: Never Smoker   . Smokeless tobacco: Never Used  . Alcohol Use: No     Mammogram: 06/22/2013 Colonoscopy: Bone Density Scan:  Pap Smear:  Eye Exam:  Vitamin D Level:   Lipid Panel:    Allergies  Allergen Reactions  . Doxycycline Nausea And Vomiting    Current Outpatient Prescriptions  Medication Sig Dispense Refill  . cyclobenzaprine (FLEXERIL) 5 MG tablet TAKE 1 TABLET BY MOUTH THREE TIMES A DAY FOR MUSCLE SPASMS  30 tablet  1  . dexamethasone (DECADRON) 4 MG tablet Take 2 tablets (8 mg total) by mouth 2 (two) times daily with a meal. Start the day after chemotherapy for 2 days. Take with food.  30 tablet  1  . fentaNYL (DURAGESIC - DOSED MCG/HR) 25 MCG/HR patch Place 1 patch (25 mcg total) onto the skin every 3 (three) days.  10 patch  0  . fentaNYL (DURAGESIC - DOSED MCG/HR) 75 MCG/HR Place 1 patch (75 mcg total) onto the skin every 3 (three) days.  10 patch  0  . gabapentin (NEURONTIN) 300 MG capsule Take 1 capsule (300 mg total) by mouth 3 (three) times daily.  90 capsule  4  . ibuprofen (ADVIL,MOTRIN) 200 MG tablet Take 600-800 mg by mouth every 4 (four) hours as needed  for mild pain or moderate pain.       Marland Kitchen lidocaine-prilocaine (EMLA) cream Apply 1 application topically as needed. Apply over port area 1-2 hours before chemo, then cover with plastic wrap  30 g  0  . loratadine (CLARITIN) 10 MG tablet Take 10 mg by mouth daily as needed for allergies.      Marland Kitchen LORazepam (ATIVAN) 0.5 MG tablet Take 0.5 mg by mouth every 6 (six) hours as needed for anxiety.      . ondansetron (ZOFRAN) 8 MG  tablet Take 1 tablet (8 mg total) by mouth 2 (two) times daily. Start the day after chemo for 2 days. Then take as needed for nausea or vomiting.  30 tablet  1  . oxyCODONE (OXY IR/ROXICODONE) 5 MG immediate release tablet Take 1 tablet (5 mg total) by mouth every 12 (twelve) hours as needed for severe pain or breakthrough pain.  60 tablet  0  . potassium chloride SA (K-DUR,KLOR-CON) 20 MEQ tablet Take 1 tablet (20 mEq total) by mouth 3 (three) times daily.  30 tablet  1  . PRESCRIPTION MEDICATION PT REPORTS RECEIVING A SHOT ONCE A MONTH AT CANCER CENTER TO STOP HER MENSTRUAL PERIODS - I SEE DOCUMENTATION IN OFFICE NOTES FROM CANCER CENTER THAT PT WAS GETTING ZOLADEX SQ EVERY 28 DAYS.      Marland Kitchen prochlorperazine (COMPAZINE) 10 MG tablet Take 1 tablet (10 mg total) by mouth every 6 (six) hours as needed (Nausea or vomiting).  30 tablet  1  . traMADol (ULTRAM) 50 MG tablet Take 1-2 tablets (50-100 mg total) by mouth every 6 (six) hours as needed.  30 tablet  1  . UNABLE TO FIND Rx: L8000- Post Surgical Bra (Quantity: 6) B6389- Non Silicone Breast prosthesis (Quantity: 1) Dx: 174.9; Left mastectomy  1 each  0  . zolpidem (AMBIEN) 5 MG tablet Take 1 tablet (5 mg total) by mouth at bedtime as needed for sleep.  30 tablet  0   No current facility-administered medications for this visit.   Facility-Administered Medications Ordered in Other Visits  Medication Dose Route Frequency Provider Last Rate Last Dose  . topical emolient (BIAFINE) emulsion   Topical Daily [name], MD         OBJECTIVE: middle-aged African American woman  Filed Vitals:   09/17/14 1311  BP: 122/76  Pulse: 95  Temp: 98.2 F (36.8 C)  Resp: 18     Body mass index is 31.45 kg/(m^2).     GENERAL: Patient is a well appearing female in no acute distress HEENT:  Sclerae anicteric.  Oropharynx clear and moist. No ulcerations or evidence of oropharyngeal candidiasis. Neck is supple.  NODES:  No cervical, supraclavicular, or axillary lymphadenopathy palpated.  BREAST EXAM:  Deferred. LUNGS:  Clear to auscultation bilaterally.  No wheezes or rhonchi. HEART:  Regular rate and rhythm. No murmur appreciated. ABDOMEN:  Soft, nontender.  Positive, normoactive bowel sounds. No organomegaly palpated. MSK:  No focal spinal tenderness to palpation. Full range of motion bilaterally in the upper extremities. EXTREMITIES:  No peripheral edema.   SKIN:  Clear with no obvious rashes or skin changes. No nail dyscrasia. NEURO:  Nonfocal. Well oriented.  Appropriate affect. ECOG FS:1 - Symptomatic but completely ambulatory     LAB RESULTS:  CMP     Component Value Date/Time   NA 139 09/10/2014 1423   NA 138 11/16/2013 0448   K 3.4* 09/10/2014 1423   K 3.5 11/16/2013 0448   CL 103 11/16/2013 0448   CL 102 06/01/2013 1500   CO2 29 09/10/2014 1423   CO2 27 11/16/2013 0448   GLUCOSE 82 09/10/2014 1423   GLUCOSE 116* 11/16/2013 0448   GLUCOSE 95 06/01/2013 1500   BUN 4.6* 09/10/2014 1423   BUN 8 11/16/2013 0448   CREATININE 0.8 09/10/2014 1423   CREATININE 0.86 11/16/2013 0448   CALCIUM 9.8 09/10/2014 1423   CALCIUM 8.9 11/16/2013 0448   PROT 7.5 09/10/2014 1423   PROT 8.2 05/18/2013 1205   ALBUMIN 2.7* 09/10/2014 1423  ALBUMIN 2.8* 05/18/2013 1205   AST 16 09/10/2014 1423   AST 18 05/18/2013 1205   ALT 13 09/10/2014 1423   ALT 16 05/18/2013 1205   ALKPHOS 120 09/10/2014 1423   ALKPHOS 111 05/18/2013 1205   BILITOT 0.46 09/10/2014 1423   BILITOT 0.2* 05/18/2013 1205   GFRNONAA 82* 11/16/2013 0448   GFRAA >90 11/16/2013 0448     I No results found for this basename: SPEP,  UPEP,   kappa and lambda light chains    Lab Results  Component Value Date   WBC 6.6 09/10/2014   NEUTROABS 4.8 09/10/2014   HGB 9.8* 09/10/2014   HCT 31.4* 09/10/2014   MCV 82.6 09/10/2014   PLT 440* 09/10/2014      Chemistry      Component Value Date/Time   NA 139 09/10/2014 1423   NA 138 11/16/2013 0448   K 3.4* 09/10/2014 1423   K 3.5 11/16/2013 0448   CL 103 11/16/2013 0448   CL 102 06/01/2013 1500   CO2 29 09/10/2014 1423   CO2 27 11/16/2013 0448   BUN 4.6* 09/10/2014 1423   BUN 8 11/16/2013 0448   CREATININE 0.8 09/10/2014 1423   CREATININE 0.86 11/16/2013 0448      Component Value Date/Time   CALCIUM 9.8 09/10/2014 1423   CALCIUM 8.9 11/16/2013 0448   ALKPHOS 120 09/10/2014 1423   ALKPHOS 111 05/18/2013 1205   AST 16 09/10/2014 1423   AST 18 05/18/2013 1205   ALT 13 09/10/2014 1423   ALT 16 05/18/2013 1205   BILITOT 0.46 09/10/2014 1423   BILITOT 0.2* 05/18/2013 1205       Lab Results  Component Value Date   LABCA2 35 05/18/2013    No components found with this basename: BLTJQ300    No results found for this basename: INR,  in the last 168 hours  Urinalysis No results found for this basename: colorurine,  appearanceur,  labspec,  phurine,  glucoseu,  hgbur,  bilirubinur,  ketonesur,  proteinur,  urobilinogen,  nitrite,  leukocytesur    STUDIES: Ct Chest W Contrast  09/03/2014   CLINICAL DATA:  Breast cancer. Prior chemotherapy and radiation. History of left mastectomy. Abdominal pain.  EXAM: CT CHEST, ABDOMEN, AND PELVIS WITH CONTRAST  TECHNIQUE: Multidetector CT imaging of the chest, abdomen and pelvis was performed following the standard protocol during bolus administration of intravenous contrast.  CONTRAST:  148m OMNIPAQUE IOHEXOL 300 MG/ML  SOLN  COMPARISON:  06/26/2014  FINDINGS: CT CHEST FINDINGS  Small right pleural effusion, significantly decreased since prior study. Soft tissue pleural masses again noted throughout the  pleural space, with index pleural lesion on image 36 measuring 2.3 x 1.0 cm on image 36, stable. Other pleural nodules noted and stable.  Right anterior cardiophrenic lymph nodes again noted with index node measuring up to 16 mm, stable since prior study.  Multiple pleural-based pulmonary nodules again noted. Anterior right upper lobe nodule has decreased in size measuring 11 x 8 mm compared with 14 x 9 mm previously. This is measured on image 20. Nodularity along the major fissure on image 26 is better seen on today's study due to decreasing right effusion, measuring 10 mm on today's study. Nodularity scratch has subpleural nodularity within the right upper lobe/ apex better seen due to the decreasing size of the right effusion. Medial nodule on image 20 tube measures 13 mm. This nodule was not seen on prior study.  Prior left mastectomy. Ill-defined airspace opacities noted anteriorly  in the left lung underlying the mastectomy site, possibly postradiation changes. No focal left lung nodule. No left effusion.  Heart is normal size. Aorta is normal caliber. No mediastinal, hilar, or axillary adenopathy.  Right Port-A-Cath remains in place, unchanged.  CT ABDOMEN AND PELVIS FINDINGS  Hepatic lesions have decreased in size. Lateral right hepatic lesion on image 56 measures up to 2.7 cm compared to 4.2 cm previously. Posterior right hepatic lesion on image 50 to measures up to 1.5 cm compared with 2.7 cm previously. No new liver lesions. Multiple gallstones fill the gallbladder. Spleen, pancreas, adrenals and kidneys are normal.  Appendix is visualized and is normal. Stomach, large and small bowel unremarkable. Uterus and adnexa as well as urinary bladder grossly unremarkable. No free fluid, free air or adenopathy.  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 Decreasing right pleural effusion.  Subpleural pulmonary nodules in  the right lung have decreased in size. There is a single new subpleural lesion in the anterior medial right upper lobe.  Improving hepatic metastases. No new metastatic disease in the abdomen or pelvis.  Changes of left mastectomy. Probable postradiation changes in the anterior left lung.  Cholelithiasis.   Electronically Signed   By: Rolm Baptise M.D.   On: 09/03/2014 14:16   Ct Abdomen Pelvis W Contrast  09/03/2014   CLINICAL DATA:  Breast cancer. Prior chemotherapy and radiation. History of left mastectomy. Abdominal pain.  EXAM: CT CHEST, ABDOMEN, AND PELVIS WITH CONTRAST  TECHNIQUE: Multidetector CT imaging of the chest, abdomen and pelvis was performed following the standard protocol during bolus administration of intravenous contrast.  CONTRAST:  132m OMNIPAQUE IOHEXOL 300 MG/ML  SOLN  COMPARISON:  06/26/2014  FINDINGS: CT CHEST FINDINGS  Small right pleural effusion, significantly decreased since prior study. Soft tissue pleural masses again noted throughout the pleural space, with index pleural lesion on image 36 measuring 2.3 x 1.0 cm on image 36, stable. Other pleural nodules noted and stable.  Right anterior cardiophrenic lymph nodes again noted with index node measuring up to 16 mm, stable since prior study.  Multiple pleural-based pulmonary nodules again noted. Anterior right upper lobe nodule has decreased in size measuring 11 x 8 mm compared with 14 x 9 mm previously. This is measured on image 20. Nodularity along the major fissure on image 26 is better seen on today's study due to decreasing right effusion, measuring 10 mm on today's study. Nodularity scratch has subpleural nodularity within the right upper lobe/ apex better seen due to the decreasing size of the right effusion. Medial nodule on image 20 tube measures 13 mm. This nodule was not seen on prior study.  Prior left mastectomy. Ill-defined airspace opacities noted anteriorly in the left lung underlying the mastectomy site, possibly  postradiation changes. No focal left lung nodule. No left effusion.  Heart is normal size. Aorta is normal caliber. No mediastinal, hilar, or axillary adenopathy.  Right Port-A-Cath remains in place, unchanged.  CT ABDOMEN AND PELVIS FINDINGS  Hepatic lesions have decreased in size. Lateral right hepatic lesion on image 56 measures up to 2.7 cm compared to 4.2 cm previously. Posterior right hepatic lesion on image 50 to measures up to 1.5 cm compared with 2.7 cm previously. No new liver lesions. Multiple gallstones fill the gallbladder. Spleen, pancreas, adrenals and kidneys are normal.  Appendix is visualized and is normal. Stomach, large and small bowel unremarkable. Uterus and adnexa  as well as urinary bladder grossly unremarkable. No free fluid, free air or adenopathy.  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 Decreasing right pleural effusion.  Subpleural pulmonary nodules in the right lung have decreased in size. There is a single new subpleural lesion in the anterior medial right upper lobe.  Improving hepatic metastases. No new metastatic disease in the abdomen or pelvis.  Changes of left mastectomy. Probable postradiation changes in the anterior left lung.  Cholelithiasis.   Electronically Signed   By: Rolm Baptise M.D.   On: 09/03/2014 14:16  ASSESSMENT: 43 y.o.  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  (0) genetic testing of the ATM, BARD1, BRCA1, BRCA2, BRIP1, CDH1, CHEK2, EPCAM, FANCC, MLH1, MSH2, MSH6, NBN, PALB2, PMS2, PTEN, RAD51C, RAD51D, STK11, TP53, and XRCC2 genes showed no deleterious mutations  (1) status post left breast biopsy 04/23/2013 for a clinical T4 N1, stage IIIB invasive ductal carcinoma, grade 3,  estrogen receptor 14% positive with moderate staining intensity, progesterone receptor negative,  with an MIB-1 of  100% and HER-2/neu   nonamplified  (2) biopsy of a left axillary lymph node [phone] was positive  (3) started monthly goserelin July of 02/01/2013  (4)  received  neoadjuvant chemotherapy with doxorubicin and cyclophosphamide in dose dense fashion x4, followed by carboplatin and paclitaxel x1, discontinued due to neuropathy, followed by carboplatin and gemcitabine, both given day 1 of each 14 day cycle x6, completed 10/12/2013 .   (5) right upper extremity DVT documented 06/29/2013,  treated with Lovenox from  July  through  December 2014    (6) status post left modified radical mastectomy 11/15/2013 for a residual pT3 pN0 invasive ductal carcinoma, grade 3, with repeat prognostic panel triple negative. All 12 axillary lymph nodes were clear. Margins were negative   (5) completed adjuvant radiation therapy 03/18/2014,  with capecitabine added 2-3 weeks into treatment.  METASTATIC DISEASE  (6) CT scans of the chest, abdomen, and pelvis 06/26/2014 showed a large right pleural effusion  with pleural studding, 2 left upper lobe nodules, and two  liver lesions. 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  Her CBC is stable and she will proceed with Eribulin today.  She is tolerating this well.  Her CMP is pending.    She will continue on Fentanyl patch and Oxycodone for her cancer related pain.    She was recommended to continue walking and exercising.  The neuropathy from previous chemotherapy is stable, and not worsened by the Eribulin.  We will continue to monitor this closely.    Erika [name] is due for Zoladex today as well and will receive this.    Per Dr. Starleen Arms last note,  "Otherwise the plan is to proceed to 3 more cycles of eribulin, given days 1 and 8 of each 21 day cycle. When she completes the next 3 cycles she will see me again, specifically November 24. The day before she will have restaging CT scans."  Erika [name] will return on 10/01/14 for labs, an appointment and cycle 5 of treatment with Eribulin.     Erika [name] understands  the goal of treatment in her case is control. She agrees with the plan. She has been encouraged to call with any issues that might arise before her next visit here.   I spent 25 minutes counseling the patient face to face.  The total time spent in the appointment was 30 minutes.   Erika [name], [place] [phone] 09/17/2014 1:18 PM

## 2014-09-17 NOTE — Patient Instructions (Signed)
[facility] Discharge Instructions for Patients Receiving Chemotherapy  Today you received the following chemotherapy agents halaven  To help prevent nausea and vomiting after your treatment, we encourage you to take your nausea medication as needed   If you develop nausea and vomiting that is not controlled by your nausea medication, call the clinic.   BELOW ARE SYMPTOMS THAT SHOULD BE REPORTED IMMEDIATELY:  *FEVER GREATER THAN 100.5 F  *CHILLS WITH OR WITHOUT FEVER  NAUSEA AND VOMITING THAT IS NOT CONTROLLED WITH YOUR NAUSEA MEDICATION  *UNUSUAL SHORTNESS OF BREATH  *UNUSUAL BRUISING OR BLEEDING  TENDERNESS IN MOUTH AND THROAT WITH OR WITHOUT PRESENCE OF ULCERS  *URINARY PROBLEMS  *BOWEL PROBLEMS  UNUSUAL RASH Items with * indicate a potential emergency and should be followed up as soon as possible.  Feel free to call the clinic you have any questions or concerns. The clinic phone number is [phone].

## 2014-09-20 ENCOUNTER — Ambulatory Visit: Payer: PRIVATE HEALTH INSURANCE

## 2014-09-20 ENCOUNTER — Telehealth: Payer: Self-pay | Admitting: *Deleted

## 2014-09-20 NOTE — Telephone Encounter (Signed)
Called to inform pt of K+ level(3.2L) and to make sure she was taking Potassium(47mEq) three times daily. Pt told me she was taking medication three times daily. Instructed pt to start taking 1- 20 mEq Potassium tablet four times daily starting today per Charlestine Massed, NP. Pt will have labs rechecked 10/01/2014 and will be re-assessed. Pt verbalized understanding and agreed with plan. Message to be forwarded to Charlestine Massed, NP.

## 2014-09-30 ENCOUNTER — Other Ambulatory Visit: Payer: Self-pay | Admitting: *Deleted

## 2014-09-30 DIAGNOSIS — C50212 Malignant neoplasm of upper-inner quadrant of left female breast: Secondary | ICD-10-CM

## 2014-10-01 ENCOUNTER — Telehealth: Payer: Self-pay | Admitting: *Deleted

## 2014-10-01 ENCOUNTER — Ambulatory Visit (HOSPITAL_BASED_OUTPATIENT_CLINIC_OR_DEPARTMENT_OTHER): Payer: No Typology Code available for payment source | Admitting: Nurse Practitioner

## 2014-10-01 ENCOUNTER — Telehealth: Payer: Self-pay | Admitting: Nurse Practitioner

## 2014-10-01 ENCOUNTER — Encounter: Payer: Self-pay | Admitting: Nurse Practitioner

## 2014-10-01 ENCOUNTER — Other Ambulatory Visit: Payer: Self-pay | Admitting: Oncology

## 2014-10-01 ENCOUNTER — Other Ambulatory Visit (HOSPITAL_BASED_OUTPATIENT_CLINIC_OR_DEPARTMENT_OTHER): Payer: No Typology Code available for payment source

## 2014-10-01 ENCOUNTER — Ambulatory Visit (HOSPITAL_BASED_OUTPATIENT_CLINIC_OR_DEPARTMENT_OTHER): Payer: No Typology Code available for payment source

## 2014-10-01 VITALS — BP 110/64 | HR 113 | Temp 99.1°F | Resp 18 | Ht 65.5 in | Wt 190.9 lb

## 2014-10-01 DIAGNOSIS — G893 Neoplasm related pain (acute) (chronic): Secondary | ICD-10-CM | POA: Insufficient documentation

## 2014-10-01 DIAGNOSIS — C50212 Malignant neoplasm of upper-inner quadrant of left female breast: Secondary | ICD-10-CM

## 2014-10-01 DIAGNOSIS — C773 Secondary and unspecified malignant neoplasm of axilla and upper limb lymph nodes: Secondary | ICD-10-CM

## 2014-10-01 DIAGNOSIS — G620 Drug-induced polyneuropathy: Secondary | ICD-10-CM

## 2014-10-01 DIAGNOSIS — I82609 Acute embolism and thrombosis of unspecified veins of unspecified upper extremity: Secondary | ICD-10-CM

## 2014-10-01 DIAGNOSIS — E876 Hypokalemia: Secondary | ICD-10-CM

## 2014-10-01 DIAGNOSIS — Z171 Estrogen receptor negative status [ER-]: Secondary | ICD-10-CM

## 2014-10-01 LAB — CBC WITH DIFFERENTIAL/PLATELET
BASO%: 0.9 % (ref 0.0–2.0)
BASOS ABS: 0.1 10*3/uL (ref 0.0–0.1)
EOS%: 0.1 % (ref 0.0–7.0)
Eosinophils Absolute: 0 10*3/uL (ref 0.0–0.5)
HCT: 32.4 % — ABNORMAL LOW (ref 34.8–46.6)
HEMOGLOBIN: 10.2 g/dL — AB (ref 11.6–15.9)
LYMPH#: 1.9 10*3/uL (ref 0.9–3.3)
LYMPH%: 19.5 % (ref 14.0–49.7)
MCH: 25.8 pg (ref 25.1–34.0)
MCHC: 31.4 g/dL — ABNORMAL LOW (ref 31.5–36.0)
MCV: 82.1 fL (ref 79.5–101.0)
MONO#: 0.9 10*3/uL (ref 0.1–0.9)
MONO%: 9 % (ref 0.0–14.0)
NEUT#: 6.8 10*3/uL — ABNORMAL HIGH (ref 1.5–6.5)
NEUT%: 70.5 % (ref 38.4–76.8)
Platelets: 514 10*3/uL — ABNORMAL HIGH (ref 145–400)
RBC: 3.94 10*6/uL (ref 3.70–5.45)
RDW: 22.1 % — ABNORMAL HIGH (ref 11.2–14.5)
WBC: 9.6 10*3/uL (ref 3.9–10.3)

## 2014-10-01 LAB — COMPREHENSIVE METABOLIC PANEL (CC13)
ALT: 14 U/L (ref 0–55)
AST: 20 U/L (ref 5–34)
Albumin: 2.8 g/dL — ABNORMAL LOW (ref 3.5–5.0)
Alkaline Phosphatase: 132 U/L (ref 40–150)
Anion Gap: 9 mEq/L (ref 3–11)
BILIRUBIN TOTAL: 0.54 mg/dL (ref 0.20–1.20)
BUN: 7.2 mg/dL (ref 7.0–26.0)
CALCIUM: 10.1 mg/dL (ref 8.4–10.4)
CHLORIDE: 103 meq/L (ref 98–109)
CO2: 23 mEq/L (ref 22–29)
CREATININE: 0.8 mg/dL (ref 0.6–1.1)
Glucose: 96 mg/dl (ref 70–140)
Potassium: 4.3 mEq/L (ref 3.5–5.1)
Sodium: 135 mEq/L — ABNORMAL LOW (ref 136–145)
Total Protein: 7.7 g/dL (ref 6.4–8.3)

## 2014-10-01 MED ORDER — SODIUM CHLORIDE 0.9 % IJ SOLN
10.0000 mL | INTRAMUSCULAR | Status: DC | PRN
Start: 2014-10-01 — End: 2014-10-01
  Administered 2014-10-01: 10 mL
  Filled 2014-10-01: qty 10

## 2014-10-01 MED ORDER — DEXAMETHASONE SODIUM PHOSPHATE 10 MG/ML IJ SOLN
INTRAMUSCULAR | Status: AC
  Filled 2014-10-01: qty 1

## 2014-10-01 MED ORDER — ONDANSETRON 8 MG/50ML IVPB (CHCC)
8.0000 mg | Freq: Once | INTRAVENOUS | Status: AC
  Administered 2014-10-01: 8 mg via INTRAVENOUS

## 2014-10-01 MED ORDER — SODIUM CHLORIDE 0.9 % IV SOLN
1.4000 mg/m2 | Freq: Once | INTRAVENOUS | Status: AC
  Administered 2014-10-01: 2.85 mg via INTRAVENOUS
  Filled 2014-10-01: qty 5.7

## 2014-10-01 MED ORDER — ONDANSETRON 8 MG/NS 50 ML IVPB
INTRAVENOUS | Status: AC
  Filled 2014-10-01: qty 8

## 2014-10-01 MED ORDER — DEXAMETHASONE SODIUM PHOSPHATE 10 MG/ML IJ SOLN
10.0000 mg | Freq: Once | INTRAMUSCULAR | Status: AC
Start: 2014-10-01 — End: 2014-10-01
  Administered 2014-10-01: 10 mg via INTRAVENOUS

## 2014-10-01 MED ORDER — SODIUM CHLORIDE 0.9 % IV SOLN
Freq: Once | INTRAVENOUS | Status: AC
  Administered 2014-10-01: 13:00:00 via INTRAVENOUS

## 2014-10-01 MED ORDER — HEPARIN SOD (PORK) LOCK FLUSH 100 UNIT/ML IV SOLN
500.0000 [IU] | Freq: Once | INTRAVENOUS | Status: AC | PRN
  Administered 2014-10-01: 500 [IU]
  Filled 2014-10-01: qty 5

## 2014-10-01 NOTE — Telephone Encounter (Signed)
per pof to sch pt appt-sent MW emailt to sch trmt-pt to get updated copy b4 leaving trmt room

## 2014-10-01 NOTE — Telephone Encounter (Signed)
Patient asked to have treatment appts made

## 2014-10-01 NOTE — Progress Notes (Signed)
ID: Erika [name] OB: 28-Dec-1970  MR#: [id number]  CSN#:[id number]  PCP: [name], MD GYN:   SU: Dr. [name] OTHER MD:  CHIEF COMPLAINT:  Newly metastatic breast cancer CURRENT TREATMENT: Eribulin, goserelin   BREAST CANCER HISTORY: From Dr. [name] original intake note:  "Patient developed a left sided breast mass show up about 6 months prior to diagnosis. 2 months prior this began getting larger rapidly. She was seen in an urgent care where this mass was thought to be an abscess. Apparently there was an attempt to excise or drain this that was not successful and she was referred to Dr. [name] for evaluation. She underwent punch biopsy on 04/23/13 of her skin and the mass. She was then also sent for mm/us. The biopsy shows invasive high grade carcinoma that appears to be breast. It is ER 14%, PR negative, HER-2/neu negative with a Ki-67 that approaches 100%. She has also undergone mm with at least 10 cm upper inner left breast tumor with mild overyling skin thickening. There is also a 3x10 mm cluster of calcs near there also. There is indeterminate 7 mm level 1 left axillary node, she underwent a lymph node biopsy that was positive for disease. No mm evidence of right breast malignancy. An MRI of the breasts was done on 05/22/13 and showed a 14 cm mass in the left breast protruding from the skin and also a 2.5 cm level II axillary node."  Her subsequent history is as detailed below  INTERVAL HISTORY:   Erika [name] returns today for follow up of her breast cancer, accompanied by her significant other, [name]. Today is day 1, cycle 5 of 6 planned cycle of eribulin, given days 1 and 8 and repeated every 21 days. She also receives goserelin injections every 4 weeks.   Erika [name] is in quite a bit of pain today, rated 8/10 to her right upper back and right flank. She is currently wearing a 27mg fentanyl patch (and puts the additional 218m patch on at home because of drowsiness).  She takes 8m17mxycodone every 12 hrs for breakthrough pain. Her last dose was at least 4 hours before this visit. She has some trouble getting comfortable at night because of this pain. Her appetite has decreased, and on occasion she feels extremely nauseous. She has compazine, zofran, and ativan at home, but lately she has relied on the ativan alone, which is not entirely helpful.  REVIEW OF SYSTEMS:  TreCylanies fevers, chills, or changes in bowel or bladder habits. She continues to walk regularly. She denies shortness of breath, chest pain, or palpitations. 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 She has a dry cough on occasion, but this is infrequent. A detailed review of systems is otherwise noncontributory.   PAST MEDICAL HISTORY: Past Medical History  Diagnosis Date  . Breast cancer     Invasive High Grade Carcinoma  . Headache(784.0)     Hx: of Migraines  . Status post chemotherapy     Adriamycin and Cytoxan  . Anxiety     ANXIOUS ABOUT HAVEING BIG SURGERY - MASTECTOMY  . 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 DVT (deep venous thrombosis)     RT ARM-PT ON LOVENOX  . Hx of iron deficiency anemia 11/26/13  . S/P radiation therapy 02-04-14 to 03-18-14  1) Left Chest Wall / 50 Gy in 25 fractions/ 2) Left Supraclavicular fossa/ 46 Gy in 23 fractions / 3) Left Posterior Axillary boost / 8.533 Gy in 23 fractions/ 4) Left Chest Wall Scar boost / 10 Gy in 5 fractions    PAST SURGICAL HISTORY: Past Surgical History  Procedure Laterality Date  . Tubal ligation    . Portacath placement Right 05/22/2013    Procedure: INSERTION PORT-A-CATH;  Surgeon: [name], MD;  Location: [facility];  Service: General;  Laterality: Right;  . Left breast needle core biopsy Left 05/21/13  . Left breast needle core  biopsy Left 06/22/13  . Mastectomy modified  radical Left 11/15/2013    Procedure: LEFT MASTECTOMY MODIFIED RADICAL;  Surgeon: [name], MD;  Location: [facility];  Service: General;  Laterality: Left;    FAMILY HISTORY Family History  Problem Relation Age of Onset  . Hypertension Mother   . Diabetes Father 72  . Stomach cancer Maternal Aunt   . Hypertension Maternal Grandmother   . Hypertension Maternal Grandfather   . Diabetes Paternal Grandmother   . Diabetes Paternal Grandfather   . Diabetes Paternal Uncle   . Diabetes Paternal Uncle   . Diabetes Paternal Uncle   The patient's mother is alive, age 28. The patient's father died from complications of diabetes at the age of 19. The patient has 2 brothers, one sister. There is no history of breast or ovarian cancer in the family to her knowledge .the patient has not undergone genetic testing   GYNECOLOGIC HISTORY:  Menarche age 67, first live birth age 11. The patient is GX P3. The patient has been on goserelin since 06/13/2013, given monthly  SOCIAL HISTORY:  The patient has worked at as a Furniture conservator/restorer and a Retail buyer. She is currently applying for disability. At home she lives with her youngest son, [name], 100; with the patient's brother Tanja Gift; and with the patient's fiance [name].   ADVANCED DIRECTIVES:   HEALTH MAINTENANCE: History  Substance Use Topics  . Smoking status: Never Smoker   . Smokeless tobacco: Never Used  . Alcohol Use: No     Mammogram: 06/22/2013 Colonoscopy: Bone Density Scan:  Pap Smear:  Eye Exam:  Vitamin D Level:   Lipid Panel:    Allergies  Allergen Reactions  . Doxycycline Nausea And Vomiting    Current Outpatient Prescriptions  Medication Sig Dispense Refill  . cyclobenzaprine (FLEXERIL) 5 MG tablet TAKE 1 TABLET BY MOUTH THREE TIMES A DAY FOR MUSCLE SPASMS  30 tablet  1  . dexamethasone (DECADRON) 4 MG tablet Take 2 tablets (8 mg total) by mouth 2 (two) times daily with a meal. Start the day after chemotherapy  for 2 days. Take with food.  30 tablet  1  . fentaNYL (DURAGESIC - DOSED MCG/HR) 25 MCG/HR patch Place 1 patch (25 mcg total) onto the skin every 3 (three) days.  10 patch  0  . fentaNYL (DURAGESIC - DOSED MCG/HR) 75 MCG/HR Place 1 patch (75 mcg total) onto the skin every 3 (three) days.  10 patch  0  . gabapentin (NEURONTIN) 300 MG capsule Take 1 capsule (300 mg total) by mouth 3 (three) times daily.  90 capsule  4  . ibuprofen (ADVIL,MOTRIN) 200 MG tablet Take 600-800 mg by mouth every 4 (four) hours as needed for mild pain or moderate pain.       Marland Kitchen lidocaine-prilocaine (EMLA) cream Apply 1 application topically as needed. Apply over port area 1-2  hours before chemo, then cover with plastic wrap  30 g  0  . loratadine (CLARITIN) 10 MG tablet Take 10 mg by mouth daily as needed for allergies.      Marland Kitchen LORazepam (ATIVAN) 0.5 MG tablet Take 0.5 mg by mouth every 6 (six) hours as needed for anxiety.      . ondansetron (ZOFRAN) 8 MG tablet Take 1 tablet (8 mg total) by mouth 2 (two) times daily. Start the day after chemo for 2 days. Then take as needed for nausea or vomiting.  30 tablet  1  . oxyCODONE (OXY IR/ROXICODONE) 5 MG immediate release tablet Take 1 tablet (5 mg total) by mouth every 12 (twelve) hours as needed for severe pain or breakthrough pain.  60 tablet  0  . potassium chloride SA (K-DUR,KLOR-CON) 20 MEQ tablet Take 1 tablet (20 mEq total) by mouth 3 (three) times daily.  30 tablet  1  . PRESCRIPTION MEDICATION PT REPORTS RECEIVING A SHOT ONCE A MONTH AT CANCER CENTER TO STOP HER MENSTRUAL PERIODS - I SEE DOCUMENTATION IN OFFICE NOTES FROM CANCER CENTER THAT PT WAS GETTING ZOLADEX SQ EVERY 28 DAYS.      Marland Kitchen UNABLE TO FIND Rx: L8000- Post Surgical Bra (Quantity: 6) X5400- Non Silicone Breast prosthesis (Quantity: 1) Dx: 174.9; Left mastectomy  1 each  0  . zolpidem (AMBIEN) 5 MG tablet Take 1 tablet (5 mg total) by mouth at bedtime as needed for sleep.  30 tablet  0  . prochlorperazine  (COMPAZINE) 10 MG tablet Take 1 tablet (10 mg total) by mouth every 6 (six) hours as needed (Nausea or vomiting).  30 tablet  1  . traMADol (ULTRAM) 50 MG tablet Take 1-2 tablets (50-100 mg total) by mouth every 6 (six) hours as needed.  30 tablet  1   No current facility-administered medications for this visit.   Facility-Administered Medications Ordered in Other Visits  Medication Dose Route Frequency Provider Last Rate Last Dose  . sodium chloride 0.9 % injection 10 mL  10 mL Intracatheter PRN [name], MD   10 mL at 10/01/14 1405  . topical emolient (BIAFINE) emulsion   Topical Daily [name], MD        OBJECTIVE: middle-aged African American woman  Filed Vitals:   10/01/14 1213  BP: 110/64  Pulse: 113  Temp: 99.1 F (37.3 C)  Resp: 18     Body mass index is 31.27 kg/(m^2).      Skin: warm, dry  HEENT: sclerae anicteric, conjunctivae pink, oropharynx clear. No thrush or mucositis.  Lymph Nodes: No cervical or supraclavicular lymphadenopathy  Lungs: clear to auscultation bilaterally, no rales, wheezes, or rhonci  Heart: regular rate and rhythm  Abdomen: round, soft, non tender, positive bowel sounds  Musculoskeletal: No focal spinal tenderness, no peripheral edema  Neuro: non focal, well oriented, positive affect  Breasts: deferred ECOG FS:1 - Symptomatic but completely ambulatory     LAB RESULTS:  CMP     Component Value Date/Time   NA 135* 10/01/2014 1146   NA 138 11/16/2013 0448   K 4.3 10/01/2014 1146   K 3.5 11/16/2013 0448   CL 103 11/16/2013 0448   CL 102 06/01/2013 1500   CO2 23 10/01/2014 1146   CO2 27 11/16/2013 0448   GLUCOSE 96 10/01/2014 1146   GLUCOSE 116* 11/16/2013 0448   GLUCOSE 95 06/01/2013 1500   BUN 7.2 10/01/2014 1146   BUN 8 11/16/2013 0448   CREATININE 0.8 10/01/2014  1146   CREATININE 0.86 11/16/2013 0448   CALCIUM 10.1 10/01/2014 1146   CALCIUM 8.9 11/16/2013 0448   PROT 7.7 10/01/2014 1146   PROT 8.2 05/18/2013 1205   ALBUMIN  2.8* 10/01/2014 1146   ALBUMIN 2.8* 05/18/2013 1205   AST 20 10/01/2014 1146   AST 18 05/18/2013 1205   ALT 14 10/01/2014 1146   ALT 16 05/18/2013 1205   ALKPHOS 132 10/01/2014 1146   ALKPHOS 111 05/18/2013 1205   BILITOT 0.54 10/01/2014 1146   BILITOT 0.2* 05/18/2013 1205   GFRNONAA 82* 11/16/2013 0448   GFRAA >90 11/16/2013 0448    I No results found for this basename: SPEP,  UPEP,   kappa and lambda light chains    Lab Results  Component Value Date   WBC 9.6 10/01/2014   NEUTROABS 6.8* 10/01/2014   HGB 10.2* 10/01/2014   HCT 32.4* 10/01/2014   MCV 82.1 10/01/2014   PLT 514* 10/01/2014      Chemistry      Component Value Date/Time   NA 135* 10/01/2014 1146   NA 138 11/16/2013 0448   K 4.3 10/01/2014 1146   K 3.5 11/16/2013 0448   CL 103 11/16/2013 0448   CL 102 06/01/2013 1500   CO2 23 10/01/2014 1146   CO2 27 11/16/2013 0448   BUN 7.2 10/01/2014 1146   BUN 8 11/16/2013 0448   CREATININE 0.8 10/01/2014 1146   CREATININE 0.86 11/16/2013 0448      Component Value Date/Time   CALCIUM 10.1 10/01/2014 1146   CALCIUM 8.9 11/16/2013 0448   ALKPHOS 132 10/01/2014 1146   ALKPHOS 111 05/18/2013 1205   AST 20 10/01/2014 1146   AST 18 05/18/2013 1205   ALT 14 10/01/2014 1146   ALT 16 05/18/2013 1205   BILITOT 0.54 10/01/2014 1146   BILITOT 0.2* 05/18/2013 1205       Lab Results  Component Value Date   LABCA2 35 05/18/2013    No components found with this basename: [id number]    No results found for this basename: INR,  in the last 168 hours  Urinalysis No results found for this basename: colorurine,  appearanceur,  labspec,  phurine,  glucoseu,  hgbur,  bilirubinur,  ketonesur,  proteinur,  urobilinogen,  nitrite,  leukocytesur    STUDIES: Ct Chest W Contrast  09/03/2014   CLINICAL DATA:  Breast cancer. Prior chemotherapy and radiation. History of left mastectomy. Abdominal pain.  EXAM: CT CHEST, ABDOMEN, AND PELVIS WITH CONTRAST  TECHNIQUE: Multidetector CT imaging of the chest,  abdomen and pelvis was performed following the standard protocol during bolus administration of intravenous contrast.  CONTRAST:  147m OMNIPAQUE IOHEXOL 300 MG/ML  SOLN  COMPARISON:  06/26/2014  FINDINGS: CT CHEST FINDINGS  Small right pleural effusion, significantly decreased since prior study. Soft tissue pleural masses again noted throughout the pleural space, with index pleural lesion on image 36 measuring 2.3 x 1.0 cm on image 36, stable. Other pleural nodules noted and stable.  Right anterior cardiophrenic lymph nodes again noted with index node measuring up to 16 mm, stable since prior study.  Multiple pleural-based pulmonary nodules again noted. Anterior right upper lobe nodule has decreased in size measuring 11 x 8 mm compared with 14 x 9 mm previously. This is measured on image 20. Nodularity along the major fissure on image 26 is better seen on today's study due to decreasing right effusion, measuring 10 mm on today's study. Nodularity scratch has subpleural nodularity within the right upper  lobe/ apex better seen due to the decreasing size of the right effusion. Medial nodule on image 20 tube measures 13 mm. This nodule was not seen on prior study.  Prior left mastectomy. Ill-defined airspace opacities noted anteriorly in the left lung underlying the mastectomy site, possibly postradiation changes. No focal left lung nodule. No left effusion.  Heart is normal size. Aorta is normal caliber. No mediastinal, hilar, or axillary adenopathy.  Right Port-A-Cath remains in place, unchanged.  CT ABDOMEN AND PELVIS FINDINGS  Hepatic lesions have decreased in size. Lateral right hepatic lesion on image 56 measures up to 2.7 cm compared to 4.2 cm previously. Posterior right hepatic lesion on image 50 to measures up to 1.5 cm compared with 2.7 cm previously. No new liver lesions. Multiple gallstones fill the gallbladder. Spleen, pancreas, adrenals and kidneys are normal.  Appendix is visualized and is normal.  Stomach, large and small bowel unremarkable. Uterus and adnexa as well as urinary bladder grossly unremarkable. No free fluid, free air or adenopathy.  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 Decreasing right pleural effusion.  Subpleural pulmonary nodules in the right lung have decreased in size. There is a single new subpleural lesion in the anterior medial right upper lobe.  Improving hepatic metastases. No new metastatic disease in the abdomen or pelvis.  Changes of left mastectomy. Probable postradiation changes in the anterior left lung.  Cholelithiasis.   Electronically Signed   By: Rolm Baptise M.D.   On: 09/03/2014 14:16   Ct Abdomen Pelvis W Contrast  09/03/2014   CLINICAL DATA:  Breast cancer. Prior chemotherapy and radiation. History of left mastectomy. Abdominal pain.  EXAM: CT CHEST, ABDOMEN, AND PELVIS WITH CONTRAST  TECHNIQUE: Multidetector CT imaging of the chest, abdomen and pelvis was performed following the standard protocol during bolus administration of intravenous contrast.  CONTRAST:  183m OMNIPAQUE IOHEXOL 300 MG/ML  SOLN  COMPARISON:  06/26/2014  FINDINGS: CT CHEST FINDINGS  Small right pleural effusion, significantly decreased since prior study. Soft tissue pleural masses again noted throughout the pleural space, with index pleural lesion on image 36 measuring 2.3 x 1.0 cm on image 36, stable. Other pleural nodules noted and stable.  Right anterior cardiophrenic lymph nodes again noted with index node measuring up to 16 mm, stable since prior study.  Multiple pleural-based pulmonary nodules again noted. Anterior right upper lobe nodule has decreased in size measuring 11 x 8 mm compared with 14 x 9 mm previously. This is measured on image 20. Nodularity along the major fissure on image 26 is better seen on today's study due to decreasing right effusion, measuring 10 mm on today's study.  Nodularity scratch has subpleural nodularity within the right upper lobe/ apex better seen due to the decreasing size of the right effusion. Medial nodule on image 20 tube measures 13 mm. This nodule was not seen on prior study.  Prior left mastectomy. Ill-defined airspace opacities noted anteriorly in the left lung underlying the mastectomy site, possibly postradiation changes. No focal left lung nodule. No left effusion.  Heart is normal size. Aorta is normal caliber. No mediastinal, hilar, or axillary adenopathy.  Right Port-A-Cath remains in place, unchanged.  CT ABDOMEN AND PELVIS FINDINGS  Hepatic lesions have decreased in size. Lateral right hepatic lesion on image 56 measures up to 2.7 cm compared to 4.2 cm previously. Posterior right hepatic lesion on image 50 to measures up to  1.5 cm compared with 2.7 cm previously. No new liver lesions. Multiple gallstones fill the gallbladder. Spleen, pancreas, adrenals and kidneys are normal.  Appendix is visualized and is normal. Stomach, large and small bowel unremarkable. Uterus and adnexa as well as urinary bladder grossly unremarkable. No free fluid, free air or adenopathy.  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 Decreasing right pleural effusion.  Subpleural pulmonary nodules in the right lung have decreased in size. There is a single new subpleural lesion in the anterior medial right upper lobe.  Improving hepatic metastases. No new metastatic disease in the abdomen or pelvis.  Changes of left mastectomy. Probable postradiation changes in the anterior left lung.  Cholelithiasis.   Electronically Signed   By: Rolm Baptise M.D.   On: 09/03/2014 14:16    ASSESSMENT: 43 y.o.  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  (0) genetic testing of the ATM, BARD1, BRCA1, BRCA2, BRIP1, CDH1, CHEK2, EPCAM, FANCC, MLH1, MSH2, MSH6, NBN,  PALB2, PMS2, PTEN, RAD51C, RAD51D, STK11, TP53, and XRCC2 genes showed no deleterious mutations  (1) status post left breast biopsy 04/23/2013 for a clinical T4 N1, stage IIIB invasive ductal carcinoma, grade 3,  estrogen receptor 14% positive with moderate staining intensity, progesterone receptor negative,  with an MIB-1 of  100% and HER-2/neu  nonamplified  (2) biopsy of a left axillary lymph node [id number] was positive  (3) started monthly goserelin July of 02/01/2013  (4)  received  neoadjuvant chemotherapy with doxorubicin and cyclophosphamide in dose dense fashion x4, followed by carboplatin and paclitaxel x1, discontinued due to neuropathy, followed by carboplatin and gemcitabine, both given day 1 of each 14 day cycle x6, completed 10/12/2013 .   (5) right upper extremity DVT documented 06/29/2013,  treated with Lovenox from  July  through  December 2014    (6) status post left modified radical mastectomy 11/15/2013 for a residual pT3 pN0 invasive ductal carcinoma, grade 3, with repeat prognostic panel triple negative. All 12 axillary lymph nodes were clear. Margins were negative   (5) completed adjuvant radiation therapy 03/18/2014,  with capecitabine added 2-3 weeks into treatment.  METASTATIC DISEASE  (6) CT scans of the chest, abdomen, and pelvis 06/26/2014 showed a large right pleural effusion  with pleural studding, 2 left upper lobe nodules, and two  liver lesions. 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 We discussed her pain management and it seems she could benefit from a midday dose of oxycodone to allow her to function between her  morning and bedtime doses. We will will keep the fentanyl patch dosing the same, but increase her oxycodone to 5 mg q8hrs instead of q12hrs. I filled out the necessary paperwork to obtain a handicap pass for her vehicle to assist with the walk from her car to school. The labs were reviewed in detail and were stable. We will proceed with day 1, cycle 5 of eribulin today. Erika [name] is next due for her goseralin on 10/18/14.   She will return on 10/08/14 for day 8, cycle 5 of eribulin. The plan is to proceed until the completion  of cycle 6, where she will have restaging chest CT. Tahari understands and agrees with this plan. She knows the goal of treatment in her case is control. She has been encouraged to call with any issues that might arise before her next visit here.   Erika [name], [place] [phone] 10/01/2014 3:19 PM

## 2014-10-01 NOTE — Patient Instructions (Signed)
[facility] Discharge Instructions for Patients Receiving Chemotherapy  Today you received the following chemotherapy agents halaven   To help prevent nausea and vomiting after your treatment, we encourage you to take your nausea medication as directed. If you develop nausea and vomiting that is not controlled by your nausea medication, call the clinic.   BELOW ARE SYMPTOMS THAT SHOULD BE REPORTED IMMEDIATELY:  *FEVER GREATER THAN 100.5 F  *CHILLS WITH OR WITHOUT FEVER  NAUSEA AND VOMITING THAT IS NOT CONTROLLED WITH YOUR NAUSEA MEDICATION  *UNUSUAL SHORTNESS OF BREATH  *UNUSUAL BRUISING OR BLEEDING  TENDERNESS IN MOUTH AND THROAT WITH OR WITHOUT PRESENCE OF ULCERS  *URINARY PROBLEMS  *BOWEL PROBLEMS  UNUSUAL RASH Items with * indicate a potential emergency and should be followed up as soon as possible.  Feel free to call the clinic you have any questions or concerns. The clinic phone number is [phone].  

## 2014-10-02 ENCOUNTER — Encounter: Payer: Self-pay | Admitting: *Deleted

## 2014-10-02 ENCOUNTER — Telehealth: Payer: Self-pay | Admitting: Nurse Practitioner

## 2014-10-02 NOTE — Telephone Encounter (Signed)
per pof to sch pt appt-cld & spoke to pt and gave time & date for appt-pt understood

## 2014-10-03 ENCOUNTER — Other Ambulatory Visit: Payer: Self-pay | Admitting: *Deleted

## 2014-10-03 DIAGNOSIS — E876 Hypokalemia: Secondary | ICD-10-CM

## 2014-10-03 MED ORDER — POTASSIUM CHLORIDE CRYS ER 20 MEQ PO TBCR: 20.0000 meq | EXTENDED_RELEASE_TABLET | Freq: Four times a day (QID) | ORAL | Status: DC

## 2014-10-07 ENCOUNTER — Other Ambulatory Visit: Payer: Self-pay | Admitting: *Deleted

## 2014-10-07 DIAGNOSIS — C50919 Malignant neoplasm of unspecified site of unspecified female breast: Secondary | ICD-10-CM

## 2014-10-07 DIAGNOSIS — C50212 Malignant neoplasm of upper-inner quadrant of left female breast: Secondary | ICD-10-CM

## 2014-10-08 ENCOUNTER — Other Ambulatory Visit: Payer: Self-pay | Admitting: *Deleted

## 2014-10-08 ENCOUNTER — Ambulatory Visit (HOSPITAL_COMMUNITY)
Admission: RE | Admit: 2014-10-08 | Discharge: 2014-10-08 | Disposition: A | Discharge status: Home / Self Care | Payer: No Typology Code available for payment source | Source: Ambulatory Visit | Attending: Nurse Practitioner | Admitting: Nurse Practitioner

## 2014-10-08 ENCOUNTER — Encounter: Payer: Self-pay | Admitting: Nurse Practitioner

## 2014-10-08 ENCOUNTER — Other Ambulatory Visit (HOSPITAL_BASED_OUTPATIENT_CLINIC_OR_DEPARTMENT_OTHER): Payer: No Typology Code available for payment source

## 2014-10-08 ENCOUNTER — Ambulatory Visit (HOSPITAL_BASED_OUTPATIENT_CLINIC_OR_DEPARTMENT_OTHER): Payer: No Typology Code available for payment source

## 2014-10-08 ENCOUNTER — Ambulatory Visit (HOSPITAL_BASED_OUTPATIENT_CLINIC_OR_DEPARTMENT_OTHER): Payer: No Typology Code available for payment source | Admitting: Nurse Practitioner

## 2014-10-08 VITALS — BP 106/71 | HR 99 | Temp 98.3°F | Resp 18 | Ht 65.5 in | Wt 187.5 lb

## 2014-10-08 DIAGNOSIS — C773 Secondary and unspecified malignant neoplasm of axilla and upper limb lymph nodes: Secondary | ICD-10-CM

## 2014-10-08 DIAGNOSIS — C50919 Malignant neoplasm of unspecified site of unspecified female breast: Secondary | ICD-10-CM | POA: Diagnosis not present

## 2014-10-08 DIAGNOSIS — E876 Hypokalemia: Secondary | ICD-10-CM

## 2014-10-08 DIAGNOSIS — J910 Malignant pleural effusion: Secondary | ICD-10-CM

## 2014-10-08 DIAGNOSIS — C799 Secondary malignant neoplasm of unspecified site: Secondary | ICD-10-CM | POA: Insufficient documentation

## 2014-10-08 DIAGNOSIS — R05 Cough: Secondary | ICD-10-CM

## 2014-10-08 DIAGNOSIS — R059 Cough, unspecified: Secondary | ICD-10-CM

## 2014-10-08 DIAGNOSIS — Z5111 Encounter for antineoplastic chemotherapy: Secondary | ICD-10-CM

## 2014-10-08 DIAGNOSIS — Z452 Encounter for adjustment and management of vascular access device: Secondary | ICD-10-CM

## 2014-10-08 DIAGNOSIS — G893 Neoplasm related pain (acute) (chronic): Secondary | ICD-10-CM

## 2014-10-08 DIAGNOSIS — Z171 Estrogen receptor negative status [ER-]: Secondary | ICD-10-CM

## 2014-10-08 DIAGNOSIS — C50212 Malignant neoplasm of upper-inner quadrant of left female breast: Secondary | ICD-10-CM

## 2014-10-08 DIAGNOSIS — Z86718 Personal history of other venous thrombosis and embolism: Secondary | ICD-10-CM

## 2014-10-08 DIAGNOSIS — G620 Drug-induced polyneuropathy: Secondary | ICD-10-CM

## 2014-10-08 LAB — COMPREHENSIVE METABOLIC PANEL (CC13)
ALK PHOS: 124 U/L (ref 40–150)
ALT: 26 U/L (ref 0–55)
AST: 19 U/L (ref 5–34)
Albumin: 3.1 g/dL — ABNORMAL LOW (ref 3.5–5.0)
Anion Gap: 10 mEq/L (ref 3–11)
BUN: 7 mg/dL (ref 7.0–26.0)
CALCIUM: 10.2 mg/dL (ref 8.4–10.4)
CHLORIDE: 103 meq/L (ref 98–109)
CO2: 27 mEq/L (ref 22–29)
CREATININE: 0.8 mg/dL (ref 0.6–1.1)
Glucose: 99 mg/dl (ref 70–140)
Potassium: 3.8 mEq/L (ref 3.5–5.1)
Sodium: 139 mEq/L (ref 136–145)
Total Bilirubin: 0.4 mg/dL (ref 0.20–1.20)
Total Protein: 7.9 g/dL (ref 6.4–8.3)

## 2014-10-08 LAB — CBC WITH DIFFERENTIAL/PLATELET
BASO%: 0.7 % (ref 0.0–2.0)
BASOS ABS: 0 10*3/uL (ref 0.0–0.1)
EOS ABS: 0 10*3/uL (ref 0.0–0.5)
EOS%: 0.1 % (ref 0.0–7.0)
HEMATOCRIT: 31.5 % — AB (ref 34.8–46.6)
HEMOGLOBIN: 10.1 g/dL — AB (ref 11.6–15.9)
LYMPH%: 28.8 % (ref 14.0–49.7)
MCH: 25.9 pg (ref 25.1–34.0)
MCHC: 32.1 g/dL (ref 31.5–36.0)
MCV: 80.6 fL (ref 79.5–101.0)
MONO#: 0.4 10*3/uL (ref 0.1–0.9)
MONO%: 7.5 % (ref 0.0–14.0)
NEUT#: 3.1 10*3/uL (ref 1.5–6.5)
NEUT%: 62.9 % (ref 38.4–76.8)
Platelets: 552 10*3/uL — ABNORMAL HIGH (ref 145–400)
RBC: 3.9 10*6/uL (ref 3.70–5.45)
RDW: 21.8 % — ABNORMAL HIGH (ref 11.2–14.5)
WBC: 5 10*3/uL (ref 3.9–10.3)
lymph#: 1.4 10*3/uL (ref 0.9–3.3)

## 2014-10-08 LAB — TECHNOLOGIST REVIEW

## 2014-10-08 MED ORDER — HYDROCODONE-HOMATROPINE 5-1.5 MG/5ML PO SYRP: 5.0000 mL | ORAL_SOLUTION | Freq: Four times a day (QID) | ORAL | Status: DC | PRN

## 2014-10-08 MED ORDER — DEXAMETHASONE SODIUM PHOSPHATE 10 MG/ML IJ SOLN
INTRAMUSCULAR | Status: AC
  Filled 2014-10-08: qty 1

## 2014-10-08 MED ORDER — ONDANSETRON 8 MG/NS 50 ML IVPB
INTRAVENOUS | Status: AC
  Filled 2014-10-08: qty 8

## 2014-10-08 MED ORDER — HEPARIN SOD (PORK) LOCK FLUSH 100 UNIT/ML IV SOLN
500.0000 [IU] | Freq: Once | INTRAVENOUS | Status: AC | PRN
  Administered 2014-10-08: 500 [IU]
  Filled 2014-10-08: qty 5

## 2014-10-08 MED ORDER — SODIUM CHLORIDE 0.9 % IV SOLN
1.4000 mg/m2 | Freq: Once | INTRAVENOUS | Status: AC
  Administered 2014-10-08: 2.85 mg via INTRAVENOUS
  Filled 2014-10-08: qty 5.7

## 2014-10-08 MED ORDER — SODIUM CHLORIDE 0.9 % IJ SOLN
10.0000 mL | INTRAMUSCULAR | Status: DC | PRN
  Administered 2014-10-08: 10 mL
  Filled 2014-10-08: qty 10

## 2014-10-08 MED ORDER — OXYCODONE HCL 5 MG PO TABS: 5.0000 mg | ORAL_TABLET | Freq: Three times a day (TID) | ORAL | Status: DC | PRN

## 2014-10-08 MED ORDER — ONDANSETRON 8 MG/50ML IVPB (CHCC)
8.0000 mg | Freq: Once | INTRAVENOUS | Status: AC
  Administered 2014-10-08: 8 mg via INTRAVENOUS

## 2014-10-08 MED ORDER — OXYCODONE HCL 5 MG PO TABS: 5.0000 mg | ORAL_TABLET | Freq: Two times a day (BID) | ORAL | Status: DC | PRN

## 2014-10-08 MED ORDER — DEXAMETHASONE SODIUM PHOSPHATE 10 MG/ML IJ SOLN
10.0000 mg | Freq: Once | INTRAMUSCULAR | Status: AC
  Administered 2014-10-08: 10 mg via INTRAVENOUS

## 2014-10-08 MED ORDER — ALTEPLASE 2 MG IJ SOLR
2.0000 mg | Freq: Once | INTRAMUSCULAR | Status: AC | PRN
  Administered 2014-10-08: 2 mg
  Filled 2014-10-08: qty 2

## 2014-10-08 MED ORDER — SODIUM CHLORIDE 0.9 % IV SOLN
Freq: Once | INTRAVENOUS | Status: AC
  Administered 2014-10-08: 15:00:00 via INTRAVENOUS

## 2014-10-08 NOTE — Progress Notes (Signed)
Excellent blood return obtained after 30 minutes of cathflo.  10cc serum withdrawn and wasted.

## 2014-10-08 NOTE — Patient Instructions (Signed)
[facility] Discharge Instructions for Patients Receiving Chemotherapy  Today you received the following chemotherapy agents halaven   To help prevent nausea and vomiting after your treatment, we encourage you to take your nausea medication as directed. If you develop nausea and vomiting that is not controlled by your nausea medication, call the clinic.   BELOW ARE SYMPTOMS THAT SHOULD BE REPORTED IMMEDIATELY:  *FEVER GREATER THAN 100.5 F  *CHILLS WITH OR WITHOUT FEVER  NAUSEA AND VOMITING THAT IS NOT CONTROLLED WITH YOUR NAUSEA MEDICATION  *UNUSUAL SHORTNESS OF BREATH  *UNUSUAL BRUISING OR BLEEDING  TENDERNESS IN MOUTH AND THROAT WITH OR WITHOUT PRESENCE OF ULCERS  *URINARY PROBLEMS  *BOWEL PROBLEMS  UNUSUAL RASH Items with * indicate a potential emergency and should be followed up as soon as possible.  Feel free to call the clinic you have any questions or concerns. The clinic phone number is [phone].  

## 2014-10-08 NOTE — Progress Notes (Signed)
ID: Erika [name] OB: [birth year]  MR#: [id number]  CSN#:[id number]  PCP: Erika Mink, MD GYN:   SU: Dr. Donne [name] OTHER MD:  CHIEF COMPLAINT:  Newly metastatic breast cancer CURRENT TREATMENT: Eribulin, goserelin   BREAST CANCER HISTORY: From Dr. Dana [name] original intake note:  "Patient developed a left sided breast mass show up about 6 months prior to diagnosis. 2 months prior this began getting larger rapidly. She was seen in an urgent care where this mass was thought to be an abscess. Apparently there was an attempt to excise or drain this that was not successful and she was referred to Dr. Donne [name] for evaluation. She underwent punch biopsy on 04/23/13 of her skin and the mass. She was then also sent for mm/us. The biopsy shows invasive high grade carcinoma that appears to be breast. It is ER 14%, PR negative, HER-2/neu negative with a Ki-67 that approaches 100%. She has also undergone mm with at least 10 cm upper inner left breast tumor with mild overyling skin thickening. There is also a 3x10 mm cluster of calcs near there also. There is indeterminate 7 mm level 1 left axillary node, she underwent a lymph node biopsy that was positive for disease. No mm evidence of right breast malignancy. An MRI of the breasts was done on 05/22/13 and showed a 14 cm mass in the left breast protruding from the skin and also a 2.5 cm level II axillary node."  Her subsequent history is as detailed below  INTERVAL HISTORY:   Erika [name] returns today for follow up of her breast cancer, accompanied by her significant other, Erika [name]. Today is day 8, cycle 5 of 6 planned cycle of eribulin, given days 1 and 8 and repeated every 21 days. She also receives goserelin injections every 4 weeks.   The interval history since her visit last week has been "up and down." Increasing her oxycodone from 52m BID to TID has been very helpful. The pain is still present, but "certainly knocks the edge off." She  continues to wear 1084m worth of fentanyl patches as well. Her pain is located along her right rib cage to her mid right back.Her appetite is decreased, and she feel full as soon as she begins eating. She has been coughing for the past 2 weeks and this has become productive with clear-white-yellow phlegm.   REVIEW OF SYSTEMS:  TrCharoletteenies fevers, chills, nausea, vomiting, or change in bowel or bladder habits. She has no mouth sores, rashes, headache, dizziness, or night sweats. She can be short of breath when she starts up a coughing fit, but denies chest pain or palpitations. A detailed review of systems is otherwise noncontributory.   PAST MEDICAL HISTORY: Past Medical History  Diagnosis Date  . Breast cancer     Invasive High Grade Carcinoma  . Headache(784.0)     Hx: of Migraines  . Status post chemotherapy     Adriamycin and Cytoxan  . Anxiety     ANXIOUS ABOUT HAVEING BIG SURGERY - MASTECTOMY  . 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 DVT (deep venous thrombosis)     RT ARM-PT ON LOVENOX  . Hx of iron deficiency anemia 11/26/13  . S/P radiation therapy 02-04-14 to 03-18-14                               1) Left Chest Wall / 50 Gy in 25 fractions/ 2) Left  Supraclavicular fossa/ 46 Gy in 23 fractions / 3) Left Posterior Axillary boost / 8.533 Gy in 23 fractions/ 4) Left Chest Wall Scar boost / 10 Gy in 5 fractions    PAST SURGICAL HISTORY: Past Surgical History  Procedure Laterality Date  . Tubal ligation    . Portacath placement Right 05/22/2013    Procedure: INSERTION PORT-A-CATH;  Surgeon: Erika Bookbinder, MD;  Location: [facility];  Service: General;  Laterality: Right;  . Left breast needle core biopsy Left 05/21/13  . Left breast needle core  biopsy Left 06/22/13  . Mastectomy modified radical Left 11/15/2013    Procedure: LEFT MASTECTOMY MODIFIED RADICAL;  Surgeon: Erika Bookbinder, MD;  Location: [facility];  Service: General;  Laterality: Left;    FAMILY HISTORY Family  History  Problem Relation Age of Onset  . Hypertension Mother   . Diabetes Father 43  . Stomach cancer Maternal Aunt   . Hypertension Maternal Grandmother   . Hypertension Maternal Grandfather   . Diabetes Paternal Grandmother   . Diabetes Paternal Grandfather   . Diabetes Paternal Uncle   . Diabetes Paternal Uncle   . Diabetes Paternal Uncle   The patient's mother is alive, age 68. The patient's father died from complications of diabetes at the age of 100. The patient has 2 brothers, one sister. There is no history of breast or ovarian cancer in the family to her knowledge .the patient has not undergone genetic testing   GYNECOLOGIC HISTORY:  Menarche age 89, first live birth age 70. The patient is GX P3. The patient has been on goserelin since 06/13/2013, given monthly  SOCIAL HISTORY:  The patient has worked at as a Furniture conservator/restorer and a Retail buyer. She is currently applying for disability. At home she lives with her youngest son, Erika [name], 52; with [name]; and with [name].   ADVANCED DIRECTIVES:   HEALTH MAINTENANCE: History  Substance Use Topics  . Smoking status: Never Smoker   . Smokeless tobacco: Never Used  . Alcohol Use: No     Mammogram: 06/22/2013 Colonoscopy: Bone Density Scan:  Pap Smear:  Eye Exam:  Vitamin D Level:   Lipid Panel:    Allergies  Allergen Reactions  . Doxycycline Nausea And Vomiting    Current Outpatient Prescriptions  Medication Sig Dispense Refill  . cyclobenzaprine (FLEXERIL) 5 MG tablet TAKE 1 TABLET BY MOUTH THREE TIMES A DAY FOR MUSCLE SPASMS  30 tablet  1  . dexamethasone (DECADRON) 4 MG tablet Take 2 tablets (8 mg total) by mouth 2 (two) times daily with a meal. Start the day after chemotherapy for 2 days. Take with food.  30 tablet  1  . fentaNYL (DURAGESIC - DOSED MCG/HR) 25 MCG/HR patch Place 1 patch (25 mcg total) onto the skin every 3 (three) days.  10 patch  0  . fentaNYL  (DURAGESIC - DOSED MCG/HR) 75 MCG/HR Place 1 patch (75 mcg total) onto the skin every 3 (three) days.  10 patch  0  . gabapentin (NEURONTIN) 300 MG capsule Take 1 capsule (300 mg total) by mouth 3 (three) times daily.  90 capsule  4  . lidocaine-prilocaine (EMLA) cream Apply 1 application topically as needed. Apply over port area 1-2 hours before chemo, then cover with plastic wrap  30 g  0  . loratadine (CLARITIN) 10 MG tablet Take 10 mg by mouth daily as needed for allergies.      Marland Kitchen LORazepam (ATIVAN) 0.5 MG tablet  Take 0.5 mg by mouth every 6 (six) hours as needed for anxiety.      . ondansetron (ZOFRAN) 8 MG tablet Take 1 tablet (8 mg total) by mouth 2 (two) times daily. Start the day after chemo for 2 days. Then take as needed for nausea or vomiting.  30 tablet  1  . potassium chloride SA (K-DUR,KLOR-CON) 20 MEQ tablet Take 1 tablet (20 mEq total) by mouth 4 (four) times daily.  120 tablet  1  . PRESCRIPTION MEDICATION PT REPORTS RECEIVING A SHOT ONCE A MONTH AT CANCER CENTER TO STOP HER MENSTRUAL PERIODS - I SEE DOCUMENTATION IN OFFICE NOTES FROM CANCER CENTER THAT PT WAS GETTING ZOLADEX SQ EVERY 28 DAYS.      Marland Kitchen prochlorperazine (COMPAZINE) 10 MG tablet Take 1 tablet (10 mg total) by mouth every 6 (six) hours as needed (Nausea or vomiting).  30 tablet  1  . traMADol (ULTRAM) 50 MG tablet Take 1-2 tablets (50-100 mg total) by mouth every 6 (six) hours as needed.  30 tablet  1  . UNABLE TO FIND Rx: L8000- Post Surgical Bra (Quantity: 6) X3235- Non Silicone Breast prosthesis (Quantity: 1) Dx: 174.9; Left mastectomy  1 each  0  . zolpidem (AMBIEN) 5 MG tablet Take 1 tablet (5 mg total) by mouth at bedtime as needed for sleep.  30 tablet  0  . ibuprofen (ADVIL,MOTRIN) 200 MG tablet Take 600-800 mg by mouth every 4 (four) hours as needed for mild pain or moderate pain.       Marland Kitchen oxyCODONE (OXY IR/ROXICODONE) 5 MG immediate release tablet Take 1 tablet (5 mg total) by mouth every 12 (twelve) hours as  needed for severe pain or breakthrough pain.  60 tablet  0   No current facility-administered medications for this visit.   Facility-Administered Medications Ordered in Other Visits  Medication Dose Route Frequency Provider Last Rate Last Dose  . topical emolient (BIAFINE) emulsion   Topical Daily [name], MD        OBJECTIVE: middle-aged African American woman  Filed Vitals:   10/08/14 1329  BP: 106/71  Pulse: 99  Temp: 98.3 F (36.8 C)  Resp: 18     Body mass index is 30.72 kg/(m^2).      Sclerae unicteric, pupils equal and reactive Oropharynx clear and moist-- no thrush No cervical or supraclavicular adenopathy Lungs no rales or rhonchi Heart regular rate and rhythm Abd soft, nontender, positive bowel sounds MSK no focal spinal tenderness, no upper extremity lymphedema Neuro: nonfocal, well oriented, appropriate affect Breasts: deferred  ECOG FS:1 - Symptomatic but completely ambulatory   LAB RESULTS:  CMP     Component Value Date/Time   NA 139 10/08/2014 1304   NA 138 11/16/2013 0448   K 3.8 10/08/2014 1304   K 3.5 11/16/2013 0448   CL 103 11/16/2013 0448   CL 102 06/01/2013 1500   CO2 27 10/08/2014 1304   CO2 27 11/16/2013 0448   GLUCOSE 99 10/08/2014 1304   GLUCOSE 116* 11/16/2013 0448   GLUCOSE 95 06/01/2013 1500   BUN 7.0 10/08/2014 1304   BUN 8 11/16/2013 0448   CREATININE 0.8 10/08/2014 1304   CREATININE 0.86 11/16/2013 0448   CALCIUM 10.2 10/08/2014 1304   CALCIUM 8.9 11/16/2013 0448   PROT 7.9 10/08/2014 1304   PROT 8.2 05/18/2013 1205   ALBUMIN 3.1* 10/08/2014 1304   ALBUMIN 2.8* 05/18/2013 1205   AST 19 10/08/2014 1304   AST 18 05/18/2013 1205  ALT 26 10/08/2014 1304   ALT 16 05/18/2013 1205   ALKPHOS 124 10/08/2014 1304   ALKPHOS 111 05/18/2013 1205   BILITOT 0.40 10/08/2014 1304   BILITOT 0.2* 05/18/2013 1205   GFRNONAA 82* 11/16/2013 0448   GFRAA >90 11/16/2013 0448    I No results found for this basename: SPEP,  UPEP,   kappa and lambda light  chains    Lab Results  Component Value Date   WBC 5.0 10/08/2014   NEUTROABS 3.1 10/08/2014   HGB 10.1* 10/08/2014   HCT 31.5* 10/08/2014   MCV 80.6 10/08/2014   PLT 552* 10/08/2014      Chemistry      Component Value Date/Time   NA 139 10/08/2014 1304   NA 138 11/16/2013 0448   K 3.8 10/08/2014 1304   K 3.5 11/16/2013 0448   CL 103 11/16/2013 0448   CL 102 06/01/2013 1500   CO2 27 10/08/2014 1304   CO2 27 11/16/2013 0448   BUN 7.0 10/08/2014 1304   BUN 8 11/16/2013 0448   CREATININE 0.8 10/08/2014 1304   CREATININE 0.86 11/16/2013 0448      Component Value Date/Time   CALCIUM 10.2 10/08/2014 1304   CALCIUM 8.9 11/16/2013 0448   ALKPHOS 124 10/08/2014 1304   ALKPHOS 111 05/18/2013 1205   AST 19 10/08/2014 1304   AST 18 05/18/2013 1205   ALT 26 10/08/2014 1304   ALT 16 05/18/2013 1205   BILITOT 0.40 10/08/2014 1304   BILITOT 0.2* 05/18/2013 1205       Lab Results  Component Value Date   LABCA2 35 05/18/2013    No components found with this basename: LABCA125    No results found for this basename: INR,  in the last 168 hours  Urinalysis No results found for this basename: colorurine,  appearanceur,  labspec,  phurine,  glucoseu,  hgbur,  bilirubinur,  ketonesur,  proteinur,  urobilinogen,  nitrite,  leukocytesur    STUDIES: No results found.  ASSESSMENT: 43 y.o.  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  (0) genetic testing of the ATM, BARD1, BRCA1, BRCA2, BRIP1, CDH1, CHEK2, EPCAM, FANCC, MLH1, MSH2, MSH6, NBN, PALB2, PMS2, PTEN, RAD51C, RAD51D, STK11, TP53, and XRCC2 genes showed no deleterious mutations  (1) status post left breast biopsy 04/23/2013 for a clinical T4 N1, stage IIIB invasive ductal carcinoma, grade 3,  estrogen receptor 14% positive with moderate staining intensity, progesterone receptor negative,  with an MIB-1 of  100% and HER-2/neu  nonamplified  (2) biopsy of a left axillary lymph node [phone] was  positive  (3) started monthly goserelin July of 02/01/2013  (4)  received  neoadjuvant chemotherapy with doxorubicin and cyclophosphamide in dose dense fashion x4, followed by carboplatin and paclitaxel x1, discontinued due to neuropathy, followed by carboplatin and gemcitabine, both given day 1 of each 14 day cycle x6, completed 10/12/2013 .   (5) right upper extremity DVT documented 06/29/2013,  treated with Lovenox from  July  through  December 2014    (6) status post left modified radical mastectomy 11/15/2013 for a residual pT3 pN0 invasive ductal carcinoma, grade 3, with repeat prognostic panel triple negative. All 12 axillary lymph nodes were clear. Margins were negative   (5) completed adjuvant radiation therapy 03/18/2014,  with capecitabine added 2-3 weeks into treatment.  METASTATIC DISEASE  (6) CT scans of the chest, abdomen, and pelvis 06/26/2014 showed a large right pleural effusion  with pleural studding, 2 left upper lobe  nodules, and two  liver lesions. 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 We will proceed with day 8, cycle 5 of eribulin today. For her cough I am prescribing hycodan 90m q6hrs PRN. I have also written orders for a chest Xray to rule out a pleural effusion, which the patient has experienced before. It may be the case that she will require a thoracentesis in the future. During this visit I also refilled her oxycodone [id number] PRN.  Erika [name] return in 2 weeks for the start of cycle 6. She will be restaged at the completion of this cycle. She understands and agrees with this plan. She knows the goal of  treatment in her case is control. She has been encouraged to call with any issues that might arise before her next visit here.    FeMarcelino [phone]/27/2015 1:54 PM

## 2014-10-15 ENCOUNTER — Other Ambulatory Visit: Payer: Self-pay | Admitting: *Deleted

## 2014-10-15 MED ORDER — FENTANYL 25 MCG/HR TD PT72: 25.0000 ug | MEDICATED_PATCH | TRANSDERMAL | Status: DC

## 2014-10-15 MED ORDER — FENTANYL 75 MCG/HR TD PT72: 75.0000 ug | MEDICATED_PATCH | TRANSDERMAL | Status: DC

## 2014-10-18 ENCOUNTER — Ambulatory Visit (HOSPITAL_BASED_OUTPATIENT_CLINIC_OR_DEPARTMENT_OTHER): Payer: No Typology Code available for payment source

## 2014-10-18 ENCOUNTER — Encounter: Payer: Self-pay | Admitting: *Deleted

## 2014-10-18 VITALS — BP 105/64 | HR 88 | Temp 97.9°F | Resp 18

## 2014-10-18 DIAGNOSIS — C50212 Malignant neoplasm of upper-inner quadrant of left female breast: Secondary | ICD-10-CM

## 2014-10-18 DIAGNOSIS — C50219 Malignant neoplasm of upper-inner quadrant of unspecified female breast: Secondary | ICD-10-CM

## 2014-10-18 DIAGNOSIS — C773 Secondary and unspecified malignant neoplasm of axilla and upper limb lymph nodes: Secondary | ICD-10-CM

## 2014-10-18 DIAGNOSIS — Z5111 Encounter for antineoplastic chemotherapy: Secondary | ICD-10-CM

## 2014-10-18 MED ORDER — GOSERELIN ACETATE 3.6 MG ~~LOC~~ IMPL
3.6000 mg | DRUG_IMPLANT | Freq: Once | SUBCUTANEOUS | Status: AC
  Administered 2014-10-18: 3.6 mg via SUBCUTANEOUS
  Filled 2014-10-18: qty 3.6

## 2014-10-18 NOTE — Progress Notes (Unsigned)
Received fax from [facility] - prior authorization request for fentanyl patch- 25 mcg and 75 mcg. Forms to [employer]

## 2014-10-21 ENCOUNTER — Other Ambulatory Visit: Payer: Self-pay | Admitting: Nurse Practitioner

## 2014-10-21 DIAGNOSIS — C50912 Malignant neoplasm of unspecified site of left female breast: Secondary | ICD-10-CM

## 2014-10-22 ENCOUNTER — Ambulatory Visit: Payer: No Typology Code available for payment source

## 2014-10-22 ENCOUNTER — Other Ambulatory Visit (HOSPITAL_BASED_OUTPATIENT_CLINIC_OR_DEPARTMENT_OTHER): Payer: No Typology Code available for payment source

## 2014-10-22 ENCOUNTER — Ambulatory Visit (HOSPITAL_BASED_OUTPATIENT_CLINIC_OR_DEPARTMENT_OTHER): Payer: No Typology Code available for payment source | Admitting: Nurse Practitioner

## 2014-10-22 ENCOUNTER — Encounter: Payer: Self-pay | Admitting: Nurse Practitioner

## 2014-10-22 VITALS — BP 109/70 | HR 99 | Temp 98.3°F | Resp 18 | Ht 65.5 in | Wt 188.2 lb

## 2014-10-22 DIAGNOSIS — C50912 Malignant neoplasm of unspecified site of left female breast: Secondary | ICD-10-CM

## 2014-10-22 DIAGNOSIS — T451X5A Adverse effect of antineoplastic and immunosuppressive drugs, initial encounter: Secondary | ICD-10-CM

## 2014-10-22 DIAGNOSIS — Z86718 Personal history of other venous thrombosis and embolism: Secondary | ICD-10-CM

## 2014-10-22 DIAGNOSIS — J910 Malignant pleural effusion: Secondary | ICD-10-CM

## 2014-10-22 DIAGNOSIS — C773 Secondary and unspecified malignant neoplasm of axilla and upper limb lymph nodes: Secondary | ICD-10-CM

## 2014-10-22 DIAGNOSIS — C50212 Malignant neoplasm of upper-inner quadrant of left female breast: Secondary | ICD-10-CM

## 2014-10-22 DIAGNOSIS — G620 Drug-induced polyneuropathy: Secondary | ICD-10-CM

## 2014-10-22 LAB — COMPREHENSIVE METABOLIC PANEL (CC13)
ALT: 12 U/L (ref 0–55)
AST: 17 U/L (ref 5–34)
Albumin: 3 g/dL — ABNORMAL LOW (ref 3.5–5.0)
Alkaline Phosphatase: 140 U/L (ref 40–150)
Anion Gap: 8 mEq/L (ref 3–11)
BILIRUBIN TOTAL: 0.28 mg/dL (ref 0.20–1.20)
BUN: 7.2 mg/dL (ref 7.0–26.0)
CO2: 27 meq/L (ref 22–29)
Calcium: 10.1 mg/dL (ref 8.4–10.4)
Chloride: 104 mEq/L (ref 98–109)
Creatinine: 0.8 mg/dL (ref 0.6–1.1)
Glucose: 100 mg/dl (ref 70–140)
Potassium: 3.9 mEq/L (ref 3.5–5.1)
SODIUM: 139 meq/L (ref 136–145)
Total Protein: 7.8 g/dL (ref 6.4–8.3)

## 2014-10-22 LAB — CBC WITH DIFFERENTIAL/PLATELET
BASO%: 0.5 % (ref 0.0–2.0)
Basophils Absolute: 0 10*3/uL (ref 0.0–0.1)
EOS%: 0.1 % (ref 0.0–7.0)
Eosinophils Absolute: 0 10*3/uL (ref 0.0–0.5)
HEMATOCRIT: 32.4 % — AB (ref 34.8–46.6)
HGB: 10.2 g/dL — ABNORMAL LOW (ref 11.6–15.9)
LYMPH%: 19.3 % (ref 14.0–49.7)
MCH: 25.7 pg (ref 25.1–34.0)
MCHC: 31.5 g/dL (ref 31.5–36.0)
MCV: 81.8 fL (ref 79.5–101.0)
MONO#: 0.3 10*3/uL (ref 0.1–0.9)
MONO%: 3.8 % (ref 0.0–14.0)
NEUT#: 5.1 10*3/uL (ref 1.5–6.5)
NEUT%: 76.3 % (ref 38.4–76.8)
PLATELETS: 529 10*3/uL — AB (ref 145–400)
RBC: 3.96 10*6/uL (ref 3.70–5.45)
RDW: 23.2 % — ABNORMAL HIGH (ref 11.2–14.5)
WBC: 6.6 10*3/uL (ref 3.9–10.3)
lymph#: 1.3 10*3/uL (ref 0.9–3.3)

## 2014-10-22 NOTE — Progress Notes (Signed)
ID: Erika [name] OB: July 17, 1971  MR#: [id number]  CSN#:[id number]  PCP: [name], MD GYN:   SU: Dr. [name] OTHER MD:  CHIEF COMPLAINT:  Newly metastatic breast cancer CURRENT TREATMENT: Eribulin, goserelin   BREAST CANCER HISTORY: From Dr. [name] original intake note:  "Patient developed a left sided breast mass show up about 6 months prior to diagnosis. 2 months prior this began getting larger rapidly. She was seen in an urgent care where this mass was thought to be an abscess. Apparently there was an attempt to excise or drain this that was not successful and she was referred to Dr. [name] for evaluation. She underwent punch biopsy on 04/23/13 of her skin and the mass. She was then also sent for mm/us. The biopsy shows invasive high grade carcinoma that appears to be breast. It is ER 14%, PR negative, HER-2/neu negative with a Ki-67 that approaches 100%. She has also undergone mm with at least 10 cm upper inner left breast tumor with mild overyling skin thickening. There is also a 3x10 mm cluster of calcs near there also. There is indeterminate 7 mm level 1 left axillary node, she underwent a lymph node biopsy that was positive for disease. No mm evidence of right breast malignancy. An MRI of the breasts was done on 05/22/13 and showed a 14 cm mass in the left breast protruding from the skin and also a 2.5 cm level II axillary node."  Her subsequent history is as detailed below  INTERVAL HISTORY:   Erika [name] returns today for follow up of her breast cancer, accompanied by her significant other, [name]. Today is day 1, cycle 6 of 6 planned cycle of eribulin, given days 1 and 8 and repeated every 21 days. She also receives goserelin injections every 4 weeks.   Her pain has been better this past 2 weeks. She is taking oxycodone $RemoveBeforeDE'5mg'yVCFKugLGMiJHjb$  TID and uses 125mcg worth of fentanyl patches. It is located along her right rib cage to mid right back. Her cough has subsided. Her appetite  is still diminished, but she makes a decent effort to each something small every few hours. She has previously documented numbness to her bilateral fingertips, that was stable on $RemoveB'300mg'GEXKseEF$  gabapentin TID, but this week it has started to hurt and ache. This is a new symptom for her.   REVIEW OF SYSTEMS:  Rhyli denies fevers, chills, nausea, vomiting, or changes in bowel or bladder habits. She has no mouth sores, rashes, headaches, dizziness, or night sweats. She denies shortness of breath, cough, or palpitations. She has mild to moderated levels of fatigue. A detailed review of systems is otherwise noncontributory.   PAST MEDICAL HISTORY: Past Medical History  Diagnosis Date  . Breast cancer     Invasive High Grade Carcinoma  . Headache(784.0)     Hx: of Migraines  . Status post chemotherapy     Adriamycin and Cytoxan  . Anxiety     ANXIOUS ABOUT HAVEING BIG SURGERY - MASTECTOMY  . 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 DVT (deep venous thrombosis)     RT ARM-PT ON LOVENOX  . Hx of iron deficiency anemia 11/26/13  . S/P radiation therapy 02-04-14 to 03-18-14                               1) Left Chest Wall / 50 Gy in 25 fractions/ 2) Left Supraclavicular fossa/ 46 Gy in  23 fractions / 3) Left Posterior Axillary boost / 8.533 Gy in 23 fractions/ 4) Left Chest Wall Scar boost / 10 Gy in 5 fractions    PAST SURGICAL HISTORY: Past Surgical History  Procedure Laterality Date  . Tubal ligation    . Portacath placement Right 05/22/2013    Procedure: INSERTION PORT-A-CATH;  Surgeon: [name], MD;  Location: [facility];  Service: General;  Laterality: Right;  . Left breast needle core biopsy Left 05/21/13  . Left breast needle core  biopsy Left 06/22/13  . Mastectomy modified radical Left 11/15/2013    Procedure: LEFT MASTECTOMY MODIFIED RADICAL;  Surgeon: [name], MD;  Location: [facility];  Service: General;  Laterality: Left;    FAMILY HISTORY Family History  Problem Relation Age  of Onset  . Hypertension Mother   . Diabetes Father 67  . Stomach cancer Maternal Aunt   . Hypertension Maternal Grandmother   . Hypertension Maternal Grandfather   . Diabetes Paternal Grandmother   . Diabetes Paternal Grandfather   . Diabetes Paternal Uncle   . Diabetes Paternal Uncle   . Diabetes Paternal Uncle   The patient's mother is alive, age 76. The patient's father died from complications of diabetes at the age of 37. The patient has 2 brothers, one sister. There is no history of breast or ovarian cancer in the family to her knowledge .the patient has not undergone genetic testing   GYNECOLOGIC HISTORY:  Menarche age 16, first live birth age 23. The patient is GX P3. The patient has been on goserelin since 06/13/2013, given monthly  SOCIAL HISTORY:  The patient has worked at as a Furniture conservator/restorer and a Retail buyer. She is currently applying for disability. At home she lives with her youngest son, Erika [name], 87; with [name]; and with the patient's fiance [name].   ADVANCED DIRECTIVES:   HEALTH MAINTENANCE: History  Substance Use Topics  . Smoking status: Never Smoker   . Smokeless tobacco: Never Used  . Alcohol Use: No     Mammogram: 06/22/2013 Colonoscopy: Bone Density Scan:  Pap Smear:  Eye Exam:  Vitamin D Level:   Lipid Panel:    Allergies  Allergen Reactions  . Doxycycline Nausea And Vomiting    Current Outpatient Prescriptions  Medication Sig Dispense Refill  . cyclobenzaprine (FLEXERIL) 5 MG tablet TAKE 1 TABLET BY MOUTH THREE TIMES A DAY FOR MUSCLE SPASMS 30 tablet 1  . dexamethasone (DECADRON) 4 MG tablet Take 2 tablets (8 mg total) by mouth 2 (two) times daily with a meal. Start the day after chemotherapy for 2 days. Take with food. 30 tablet 1  . fentaNYL (DURAGESIC - DOSED MCG/HR) 25 MCG/HR patch Place 1 patch (25 mcg total) onto the skin every 3 (three) days. 10 patch 0  . fentaNYL (DURAGESIC - DOSED MCG/HR) 75 MCG/HR  Place 1 patch (75 mcg total) onto the skin every 3 (three) days. 10 patch 0  . gabapentin (NEURONTIN) 300 MG capsule Take 1 capsule (300 mg total) by mouth 3 (three) times daily. 90 capsule 4  . HYDROcodone-homatropine (HYCODAN) 5-1.5 MG/5ML syrup Take 5 mLs by mouth every 6 (six) hours as needed for cough. 240 mL 0  . lidocaine-prilocaine (EMLA) cream Apply 1 application topically as needed. Apply over port area 1-2 hours before chemo, then cover with plastic wrap 30 g 0  . loratadine (CLARITIN) 10 MG tablet Take 10 mg by mouth daily as needed for allergies.    Marland Kitchen LORazepam (  ATIVAN) 0.5 MG tablet Take 0.5 mg by mouth every 6 (six) hours as needed for anxiety.    . ondansetron (ZOFRAN) 8 MG tablet Take 1 tablet (8 mg total) by mouth 2 (two) times daily. Start the day after chemo for 2 days. Then take as needed for nausea or vomiting. 30 tablet 1  . oxyCODONE (OXY IR/ROXICODONE) 5 MG immediate release tablet Take 1 tablet (5 mg total) by mouth every 8 (eight) hours as needed for severe pain or breakthrough pain. 90 tablet 0  . potassium chloride SA (K-DUR,KLOR-CON) 20 MEQ tablet Take 1 tablet (20 mEq total) by mouth 4 (four) times daily. 120 tablet 1  . PRESCRIPTION MEDICATION PT REPORTS RECEIVING A SHOT ONCE A MONTH AT CANCER CENTER TO STOP HER MENSTRUAL PERIODS - I SEE DOCUMENTATION IN OFFICE NOTES FROM CANCER CENTER THAT PT WAS GETTING ZOLADEX SQ EVERY 28 DAYS.    Marland Kitchen traMADol (ULTRAM) 50 MG tablet Take 1-2 tablets (50-100 mg total) by mouth every 6 (six) hours as needed. 30 tablet 1  . UNABLE TO FIND Rx: L8000- Post Surgical Bra (Quantity: 6) L8020- Non Silicone Breast prosthesis (Quantity: 1) Dx: 174.9; Left mastectomy 1 each 0  . zolpidem (AMBIEN) 5 MG tablet Take 1 tablet (5 mg total) by mouth at bedtime as needed for sleep. 30 tablet 0  . ibuprofen (ADVIL,MOTRIN) 200 MG tablet Take 600-800 mg by mouth every 4 (four) hours as needed for mild pain or moderate pain.     Marland Kitchen prochlorperazine  (COMPAZINE) 10 MG tablet Take 1 tablet (10 mg total) by mouth every 6 (six) hours as needed (Nausea or vomiting). 30 tablet 1   No current facility-administered medications for this visit.   Facility-Administered Medications Ordered in Other Visits  Medication Dose Route Frequency Provider Last Rate Last Dose  . topical emolient (BIAFINE) emulsion   Topical Daily Lonie Peak, MD        OBJECTIVE: middle-aged African American woman  Filed Vitals:   10/22/14 1307  BP: 109/70  Pulse: 99  Temp: 98.3 F (36.8 C)  Resp: 18     Body mass index is 30.83 kg/(m^2).      Skin: warm, dry  HEENT: sclerae anicteric, conjunctivae pink, oropharynx clear. No thrush or mucositis.  Lymph Nodes: No cervical or supraclavicular lymphadenopathy  Lungs: clear to auscultation bilaterally, no rales, wheezes, or rhonci  Heart: regular rate and rhythm  Abdomen: round, soft, non tender, positive bowel sounds  Musculoskeletal: No focal spinal tenderness, no peripheral edema  Neuro: non focal, well oriented, positive affect  Breasts: deferred  ECOG FS:1 - Symptomatic but completely ambulatory   LAB RESULTS:  CMP     Component Value Date/Time   NA 139 10/22/2014 1252   NA 138 11/16/2013 0448   K 3.9 10/22/2014 1252   K 3.5 11/16/2013 0448   CL 103 11/16/2013 0448   CL 102 06/01/2013 1500   CO2 27 10/22/2014 1252   CO2 27 11/16/2013 0448   GLUCOSE 100 10/22/2014 1252   GLUCOSE 116* 11/16/2013 0448   GLUCOSE 95 06/01/2013 1500   BUN 7.2 10/22/2014 1252   BUN 8 11/16/2013 0448   CREATININE 0.8 10/22/2014 1252   CREATININE 0.86 11/16/2013 0448   CALCIUM 10.1 10/22/2014 1252   CALCIUM 8.9 11/16/2013 0448   PROT 7.8 10/22/2014 1252   PROT 8.2 05/18/2013 1205   ALBUMIN 3.0* 10/22/2014 1252   ALBUMIN 2.8* 05/18/2013 1205   AST 17 10/22/2014 1252   AST 18 05/18/2013  1205   ALT 12 10/22/2014 1252   ALT 16 05/18/2013 1205   ALKPHOS 140 10/22/2014 1252   ALKPHOS 111 05/18/2013 1205   BILITOT  0.28 10/22/2014 1252   BILITOT 0.2* 05/18/2013 1205   GFRNONAA 82* 11/16/2013 0448   GFRAA >90 11/16/2013 0448    I No results found for: SPEP  Lab Results  Component Value Date   WBC 6.6 10/22/2014   NEUTROABS 5.1 10/22/2014   HGB 10.2* 10/22/2014   HCT 32.4* 10/22/2014   MCV 81.8 10/22/2014   PLT 529* 10/22/2014      Chemistry      Component Value Date/Time   NA 139 10/22/2014 1252   NA 138 11/16/2013 0448   K 3.9 10/22/2014 1252   K 3.5 11/16/2013 0448   CL 103 11/16/2013 0448   CL 102 06/01/2013 1500   CO2 27 10/22/2014 1252   CO2 27 11/16/2013 0448   BUN 7.2 10/22/2014 1252   BUN 8 11/16/2013 0448   CREATININE 0.8 10/22/2014 1252   CREATININE 0.86 11/16/2013 0448      Component Value Date/Time   CALCIUM 10.1 10/22/2014 1252   CALCIUM 8.9 11/16/2013 0448   ALKPHOS 140 10/22/2014 1252   ALKPHOS 111 05/18/2013 1205   AST 17 10/22/2014 1252   AST 18 05/18/2013 1205   ALT 12 10/22/2014 1252   ALT 16 05/18/2013 1205   BILITOT 0.28 10/22/2014 1252   BILITOT 0.2* 05/18/2013 1205       Lab Results  Component Value Date   LABCA2 35 05/18/2013    No components found for: [id number]  No results for input(s): INR in the last 168 hours.  Urinalysis No results found for: COLORURINE  STUDIES: Dg Chest 2 View  10/09/2014   CLINICAL DATA:  Metastatic breast cancer.  Cough.  EXAM: CHEST  2 VIEW  COMPARISON:  Chest radiograph of July 03, 2014 ; CT scan of September 03, 2014.  FINDINGS: Stable cardiomediastinal silhouette. Left lung appears clear. Surgical clips are noted in left axillary region. Stable right subclavian Port-A-Cath with distal tip overlying expected position of the SVC. Minimal right pleural effusion is noted with associated pleural thickening concerning for metastatic disease based on prior CT scan. No pneumothorax is noted.  IMPRESSION: Minimal right pleural effusion is noted with associated pleural thickening in right hemi thorax concerning for  metastatic disease based on prior CT scan.   Electronically Signed   By: Roque Lias M.D.   On: 10/09/2014 08:01    ASSESSMENT: 43 y.o.  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  (0) genetic testing of the ATM, BARD1, BRCA1, BRCA2, BRIP1, CDH1, CHEK2, EPCAM, FANCC, MLH1, MSH2, MSH6, NBN, PALB2, PMS2, PTEN, RAD51C, RAD51D, STK11, TP53, and XRCC2 genes showed no deleterious mutations  (1) status post left breast biopsy 04/23/2013 for a clinical T4 N1, stage IIIB invasive ductal carcinoma, grade 3,  estrogen receptor 14% positive with moderate staining intensity, progesterone receptor negative,  with an MIB-1 of  100% and HER-2/neu  nonamplified  (2) biopsy of a left axillary lymph node [phone] was positive  (3) started monthly goserelin July of 02/01/2013  (4)  received  neoadjuvant chemotherapy with doxorubicin and cyclophosphamide in dose dense fashion x4, followed by carboplatin and paclitaxel x1, discontinued due to neuropathy, followed by carboplatin and gemcitabine, both given day 1 of each 14 day cycle x6, completed 10/12/2013 .   (5) right upper extremity DVT documented 06/29/2013,  treated with Lovenox from  July  through  December 2014    (6) status post left modified radical mastectomy 11/15/2013 for a residual pT3 pN0 invasive ductal carcinoma, grade 3, with repeat prognostic panel triple negative. All 12 axillary lymph nodes were clear. Margins were negative   (5) completed adjuvant radiation therapy 03/18/2014,  with capecitabine added 2-3 weeks into treatment.  METASTATIC DISEASE  (6) CT scans of the chest, abdomen, and pelvis 06/26/2014 showed a large right pleural effusion  with pleural studding, 2 left upper lobe nodules, and two  liver lesions. Cytology from right thoracentesis 07/03/2014 showed malignant cells consistent with the patient's known primary  (7) started eribulin 07/09/2014, given days 1 and 8 of each 21 day cycle; restagind  studies after cycle 3 showed evidence of response. Discontinued after cycle 5 (last dose 10/08/14) because of worsening peripheral neuropathy.   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 Jana Hakim, and given the changes in her peripheral neuropathy symptoms, we are discontinuing the eribulin at this time. She will continue the gabapentin 32m TID.   TKariawas scheduled to be rescanned on 11/23, and discuss these results with Dr. MJana Hakimon the 24th. Neither of these appointments will change. Though she is also not receiving chemotherapy next week, TAleshkahas chosen to keep her office visit with me on 11/17. She understands and agrees with this plan. She knows the goal of treatment in her case is control. She has been encouraged to call with any issues that might arise before her next visit here.  FMarcelino Duster [id number]/09/2014 1:42 PM

## 2014-10-29 ENCOUNTER — Encounter: Payer: Self-pay | Admitting: Nurse Practitioner

## 2014-10-29 ENCOUNTER — Ambulatory Visit (HOSPITAL_BASED_OUTPATIENT_CLINIC_OR_DEPARTMENT_OTHER): Payer: No Typology Code available for payment source | Admitting: Nurse Practitioner

## 2014-10-29 ENCOUNTER — Other Ambulatory Visit (HOSPITAL_BASED_OUTPATIENT_CLINIC_OR_DEPARTMENT_OTHER): Payer: No Typology Code available for payment source

## 2014-10-29 VITALS — BP 105/66 | HR 98 | Temp 98.3°F | Resp 18 | Ht 65.5 in | Wt 187.4 lb

## 2014-10-29 DIAGNOSIS — C50212 Malignant neoplasm of upper-inner quadrant of left female breast: Secondary | ICD-10-CM

## 2014-10-29 DIAGNOSIS — C50912 Malignant neoplasm of unspecified site of left female breast: Secondary | ICD-10-CM

## 2014-10-29 DIAGNOSIS — R112 Nausea with vomiting, unspecified: Secondary | ICD-10-CM

## 2014-10-29 DIAGNOSIS — J910 Malignant pleural effusion: Secondary | ICD-10-CM

## 2014-10-29 DIAGNOSIS — G893 Neoplasm related pain (acute) (chronic): Secondary | ICD-10-CM

## 2014-10-29 DIAGNOSIS — C50919 Malignant neoplasm of unspecified site of unspecified female breast: Secondary | ICD-10-CM

## 2014-10-29 DIAGNOSIS — G620 Drug-induced polyneuropathy: Secondary | ICD-10-CM

## 2014-10-29 DIAGNOSIS — C773 Secondary and unspecified malignant neoplasm of axilla and upper limb lymph nodes: Secondary | ICD-10-CM

## 2014-10-29 DIAGNOSIS — T451X5A Adverse effect of antineoplastic and immunosuppressive drugs, initial encounter: Secondary | ICD-10-CM

## 2014-10-29 LAB — COMPREHENSIVE METABOLIC PANEL (CC13)
ALT: 9 U/L (ref 0–55)
AST: 16 U/L (ref 5–34)
Albumin: 2.7 g/dL — ABNORMAL LOW (ref 3.5–5.0)
Alkaline Phosphatase: 153 U/L — ABNORMAL HIGH (ref 40–150)
Anion Gap: 9 mEq/L (ref 3–11)
BUN: 6.5 mg/dL — ABNORMAL LOW (ref 7.0–26.0)
CO2: 27 mEq/L (ref 22–29)
Calcium: 10.2 mg/dL (ref 8.4–10.4)
Chloride: 102 mEq/L (ref 98–109)
Creatinine: 0.8 mg/dL (ref 0.6–1.1)
Glucose: 102 mg/dl (ref 70–140)
Potassium: 4.4 mEq/L (ref 3.5–5.1)
Sodium: 137 mEq/L (ref 136–145)
Total Bilirubin: 0.32 mg/dL (ref 0.20–1.20)
Total Protein: 7.7 g/dL (ref 6.4–8.3)

## 2014-10-29 LAB — CBC WITH DIFFERENTIAL/PLATELET
BASO%: 0.1 % (ref 0.0–2.0)
Basophils Absolute: 0 10*3/uL (ref 0.0–0.1)
EOS%: 0.5 % (ref 0.0–7.0)
Eosinophils Absolute: 0.1 10*3/uL (ref 0.0–0.5)
HEMATOCRIT: 30 % — AB (ref 34.8–46.6)
HEMOGLOBIN: 9.4 g/dL — AB (ref 11.6–15.9)
LYMPH%: 14.8 % (ref 14.0–49.7)
MCH: 25.8 pg (ref 25.1–34.0)
MCHC: 31.3 g/dL — AB (ref 31.5–36.0)
MCV: 82.2 fL (ref 79.5–101.0)
MONO#: 0.7 10*3/uL (ref 0.1–0.9)
MONO%: 6.9 % (ref 0.0–14.0)
NEUT#: 7.7 10*3/uL — ABNORMAL HIGH (ref 1.5–6.5)
NEUT%: 77.7 % — AB (ref 38.4–76.8)
PLATELETS: 442 10*3/uL — AB (ref 145–400)
RBC: 3.65 10*6/uL — ABNORMAL LOW (ref 3.70–5.45)
RDW: 20.5 % — ABNORMAL HIGH (ref 11.2–14.5)
WBC: 9.9 10*3/uL (ref 3.9–10.3)
lymph#: 1.5 10*3/uL (ref 0.9–3.3)

## 2014-10-29 NOTE — Progress Notes (Signed)
ID: Erika [name] OB: 1971/11/05  MR#: [id number]  CSN#:[id number]  PCP: [name], MD GYN:   SU: Dr. [name] OTHER MD:  CHIEF COMPLAINT:  Newly metastatic breast cancer CURRENT TREATMENT: Eribulin, goserelin   BREAST CANCER HISTORY: From Dr. [name] original intake note:  "Patient developed a left sided breast mass show up about 6 months prior to diagnosis. 2 months prior this began getting larger rapidly. She was seen in an urgent care where this mass was thought to be an abscess. Apparently there was an attempt to excise or drain this that was not successful and she was referred to Dr. [name] for evaluation. She underwent punch biopsy on 04/23/13 of her skin and the mass. She was then also sent for mm/us. The biopsy shows invasive high grade carcinoma that appears to be breast. It is ER 14%, PR negative, HER-2/neu negative with a Ki-67 that approaches 100%. She has also undergone mm with at least 10 cm upper inner left breast tumor with mild overyling skin thickening. There is also a 3x10 mm cluster of calcs near there also. There is indeterminate 7 mm level 1 left axillary node, she underwent a lymph node biopsy that was positive for disease. No mm evidence of right breast malignancy. An MRI of the breasts was done on 05/22/13 and showed a 14 cm mass in the left breast protruding from the skin and also a 2.5 cm level II axillary node."  Her subsequent history is as detailed below  INTERVAL HISTORY:   Erika [name] returns today for follow up of her breast cancer, accompanied by her significant other, [name]. Last week we stopped her eribulin as she began to have worsening peripheral neuropathy symptoms after the completion of 5 cycles. The numbness is better this week and affects less of her fingerpads now. What is is new for her is nausea and accompanying vomiting daily x 3 days. The emesis is "chunky" and basically the remnants of whatever she has eaten. It does not look  like bile. She has not vomited today, and has managed to keep 1/2 of a chicken sandwich down. She never took any of her PRN nausea meds because it never crossed her mind. Her pain has been worse for the past 48 hrs. She took a bath on Sunday night and forgot to replace her fentanyl patches until the next night. As expected, with the fentanyl out of her system, her pain has been increased. She has been using 46m oxycodone TID.   REVIEW OF SYSTEMS:  TMonzeratdenies fevers, chills, or changes in bowel or bladder habits. She has no mouth sores, rashes, headaches, dizziness, or night sweats. She denies shortness of breath, cough, or palpitations. She has mild to moderated levels of fatigue. A detailed review of systems is otherwise noncontributory.   PAST MEDICAL HISTORY: Past Medical History  Diagnosis Date  . Breast cancer     Invasive High Grade Carcinoma  . Headache(784.0)     Hx: of Migraines  . Status post chemotherapy     Adriamycin and Cytoxan  . Anxiety     ANXIOUS ABOUT HAVEING BIG SURGERY - MASTECTOMY  . 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 DVT (deep venous thrombosis)     RT ARM-PT ON LOVENOX  . Hx of iron deficiency anemia 11/26/13  . S/P radiation therapy 02-04-14 to 03-18-14  1) Left Chest Wall / 50 Gy in 25 fractions/ 2) Left Supraclavicular fossa/ 46 Gy in 23 fractions / 3) Left Posterior Axillary boost / 8.533 Gy in 23 fractions/ 4) Left Chest Wall Scar boost / 10 Gy in 5 fractions    PAST SURGICAL HISTORY: Past Surgical History  Procedure Laterality Date  . Tubal ligation    . Portacath placement Right 05/22/2013    Procedure: INSERTION PORT-A-CATH;  Surgeon: [name], MD;  Location: [facility];  Service: General;  Laterality: Right;  . Left breast needle core biopsy Left 05/21/13  . Left breast needle core  biopsy Left 06/22/13  . Mastectomy modified radical Left 11/15/2013    Procedure: LEFT MASTECTOMY MODIFIED RADICAL;  Surgeon: [name], MD;  Location: [facility];  Service: General;  Laterality: Left;    FAMILY HISTORY Family History  Problem Relation Age of Onset  . Hypertension Mother   . Diabetes Father 3  . Stomach cancer Maternal Aunt   . Hypertension Maternal Grandmother   . Hypertension Maternal Grandfather   . Diabetes Paternal Grandmother   . Diabetes Paternal Grandfather   . Diabetes Paternal Uncle   . Diabetes Paternal Uncle   . Diabetes Paternal Uncle   The patient's mother is alive, age 38. The patient's father died from complications of diabetes at the age of 19. The patient has 2 brothers, one sister. There is no history of breast or ovarian cancer in the family to her knowledge .the patient has not undergone genetic testing   GYNECOLOGIC HISTORY:  Menarche age 40, first live birth age 74. The patient is GX P3. The patient has been on goserelin since 06/13/2013, given monthly  SOCIAL HISTORY:  The patient has worked at as a Furniture conservator/restorer and a Retail buyer. She is currently applying for disability. At home she lives with her youngest son, [name], 48; with the patient's brother [name]; and with the patient's fiance [name].   ADVANCED DIRECTIVES:   HEALTH MAINTENANCE: History  Substance Use Topics  . Smoking status: Never Smoker   . Smokeless tobacco: Never Used  . Alcohol Use: No     Mammogram: 06/22/2013 Colonoscopy: Bone Density Scan:  Pap Smear:  Eye Exam:  Vitamin D Level:   Lipid Panel:    Allergies  Allergen Reactions  . Doxycycline Nausea And Vomiting    Current Outpatient Prescriptions  Medication Sig Dispense Refill  . cyclobenzaprine (FLEXERIL) 5 MG tablet TAKE 1 TABLET BY MOUTH THREE TIMES A DAY FOR MUSCLE SPASMS 30 tablet 1  . dexamethasone (DECADRON) 4 MG tablet Take 2 tablets (8 mg total) by mouth 2 (two) times daily with a meal. Start the day after chemotherapy for 2 days. Take with food. 30 tablet 1  . fentaNYL (DURAGESIC - DOSED MCG/HR) 25 MCG/HR  patch Place 1 patch (25 mcg total) onto the skin every 3 (three) days. 10 patch 0  . fentaNYL (DURAGESIC - DOSED MCG/HR) 75 MCG/HR Place 1 patch (75 mcg total) onto the skin every 3 (three) days. 10 patch 0  . gabapentin (NEURONTIN) 300 MG capsule Take 1 capsule (300 mg total) by mouth 3 (three) times daily. 90 capsule 4  . lidocaine-prilocaine (EMLA) cream Apply 1 application topically as needed. Apply over port area 1-2 hours before chemo, then cover with plastic wrap 30 g 0  . loratadine (CLARITIN) 10 MG tablet Take 10 mg by mouth daily as needed for allergies.    Marland Kitchen oxyCODONE (OXY IR/ROXICODONE) 5 MG immediate release  tablet Take 1 tablet (5 mg total) by mouth every 8 (eight) hours as needed for severe pain or breakthrough pain. 90 tablet 0  . potassium chloride SA (K-DUR,KLOR-CON) 20 MEQ tablet Take 1 tablet (20 mEq total) by mouth 4 (four) times daily. 120 tablet 1  . PRESCRIPTION MEDICATION PT REPORTS RECEIVING A SHOT ONCE A MONTH AT CANCER CENTER TO STOP HER MENSTRUAL PERIODS - I SEE DOCUMENTATION IN OFFICE NOTES FROM CANCER CENTER THAT PT WAS GETTING ZOLADEX SQ EVERY 28 DAYS.    Marland Kitchen prochlorperazine (COMPAZINE) 10 MG tablet Take 1 tablet (10 mg total) by mouth every 6 (six) hours as needed (Nausea or vomiting). 30 tablet 1  . traMADol (ULTRAM) 50 MG tablet Take 1-2 tablets (50-100 mg total) by mouth every 6 (six) hours as needed. 30 tablet 1  . UNABLE TO FIND Rx: L8000- Post Surgical Bra (Quantity: 6) D4081- Non Silicone Breast prosthesis (Quantity: 1) Dx: 174.9; Left mastectomy 1 each 0  . zolpidem (AMBIEN) 5 MG tablet Take 1 tablet (5 mg total) by mouth at bedtime as needed for sleep. 30 tablet 0  . HYDROcodone-homatropine (HYCODAN) 5-1.5 MG/5ML syrup Take 5 mLs by mouth every 6 (six) hours as needed for cough. 240 mL 0  . ibuprofen (ADVIL,MOTRIN) 200 MG tablet Take 600-800 mg by mouth every 4 (four) hours as needed for mild pain or moderate pain.     Marland Kitchen LORazepam (ATIVAN) 0.5 MG tablet Take  0.5 mg by mouth every 6 (six) hours as needed for anxiety.    . ondansetron (ZOFRAN) 8 MG tablet Take 1 tablet (8 mg total) by mouth 2 (two) times daily. Start the day after chemo for 2 days. Then take as needed for nausea or vomiting. 30 tablet 1   No current facility-administered medications for this visit.   Facility-Administered Medications Ordered in Other Visits  Medication Dose Route Frequency Provider Last Rate Last Dose  . topical emolient (BIAFINE) emulsion   Topical Daily [name], MD        OBJECTIVE: middle-aged African American woman  Filed Vitals:   10/29/14 1326  BP: 105/66  Pulse: 98  Temp: 98.3 F (36.8 C)  Resp: 18     Body mass index is 30.7 kg/(m^2).      Skin: warm, dry  HEENT: sclerae anicteric, conjunctivae pink, oropharynx clear. No thrush or mucositis.  Lymph Nodes: No cervical or supraclavicular lymphadenopathy  Lungs: clear to auscultation bilaterally, no rales, wheezes, or rhonci  Heart: regular rate and rhythm  Abdomen: round, soft, non tender, positive bowel sounds  Musculoskeletal: No focal spinal tenderness, no peripheral edema  Neuro: non focal, well oriented, positive affect  Breasts: deferred  ECOG FS:1 - Symptomatic but completely ambulatory   LAB RESULTS:  CMP     Component Value Date/Time   NA 139 10/22/2014 1252   NA 138 11/16/2013 0448   K 3.9 10/22/2014 1252   K 3.5 11/16/2013 0448   CL 103 11/16/2013 0448   CL 102 06/01/2013 1500   CO2 27 10/22/2014 1252   CO2 27 11/16/2013 0448   GLUCOSE 100 10/22/2014 1252   GLUCOSE 116* 11/16/2013 0448   GLUCOSE 95 06/01/2013 1500   BUN 7.2 10/22/2014 1252   BUN 8 11/16/2013 0448   CREATININE 0.8 10/22/2014 1252   CREATININE 0.86 11/16/2013 0448   CALCIUM 10.1 10/22/2014 1252   CALCIUM 8.9 11/16/2013 0448   PROT 7.8 10/22/2014 1252   PROT 8.2 05/18/2013 1205   ALBUMIN 3.0* 10/22/2014 1252  ALBUMIN 2.8* 05/18/2013 1205   AST 17 10/22/2014 1252   AST 18 05/18/2013 1205   ALT  12 10/22/2014 1252   ALT 16 05/18/2013 1205   ALKPHOS 140 10/22/2014 1252   ALKPHOS 111 05/18/2013 1205   BILITOT 0.28 10/22/2014 1252   BILITOT 0.2* 05/18/2013 1205   GFRNONAA 82* 11/16/2013 0448   GFRAA >90 11/16/2013 0448    I No results found for: SPEP  Lab Results  Component Value Date   WBC 9.9 10/29/2014   NEUTROABS 7.7* 10/29/2014   HGB 9.4* 10/29/2014   HCT 30.0* 10/29/2014   MCV 82.2 10/29/2014   PLT 442* 10/29/2014      Chemistry      Component Value Date/Time   NA 139 10/22/2014 1252   NA 138 11/16/2013 0448   K 3.9 10/22/2014 1252   K 3.5 11/16/2013 0448   CL 103 11/16/2013 0448   CL 102 06/01/2013 1500   CO2 27 10/22/2014 1252   CO2 27 11/16/2013 0448   BUN 7.2 10/22/2014 1252   BUN 8 11/16/2013 0448   CREATININE 0.8 10/22/2014 1252   CREATININE 0.86 11/16/2013 0448      Component Value Date/Time   CALCIUM 10.1 10/22/2014 1252   CALCIUM 8.9 11/16/2013 0448   ALKPHOS 140 10/22/2014 1252   ALKPHOS 111 05/18/2013 1205   AST 17 10/22/2014 1252   AST 18 05/18/2013 1205   ALT 12 10/22/2014 1252   ALT 16 05/18/2013 1205   BILITOT 0.28 10/22/2014 1252   BILITOT 0.2* 05/18/2013 1205       Lab Results  Component Value Date   LABCA2 35 05/18/2013    No components found for: [id number]  No results for input(s): INR in the last 168 hours.  Urinalysis No results found for: COLORURINE  STUDIES: Dg Chest 2 View  10/09/2014   CLINICAL DATA:  Metastatic breast cancer.  Cough.  EXAM: CHEST  2 VIEW  COMPARISON:  Chest radiograph of July 03, 2014 ; CT scan of September 03, 2014.  FINDINGS: Stable cardiomediastinal silhouette. Left lung appears clear. Surgical clips are noted in left axillary region. Stable right subclavian Port-A-Cath with distal tip overlying expected position of the SVC. Minimal right pleural effusion is noted with associated pleural thickening concerning for metastatic disease based on prior CT scan. No pneumothorax is noted.   IMPRESSION: Minimal right pleural effusion is noted with associated pleural thickening in right hemi thorax concerning for metastatic disease based on prior CT scan.   Electronically Signed   By: Sabino Dick M.D.   On: 10/09/2014 08:01    ASSESSMENT: 43 y.o.  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  (0) genetic testing of the ATM, BARD1, BRCA1, BRCA2, BRIP1, CDH1, CHEK2, EPCAM, FANCC, MLH1, MSH2, MSH6, NBN, PALB2, PMS2, PTEN, RAD51C, RAD51D, STK11, TP53, and XRCC2 genes showed no deleterious mutations  (1) status post left breast biopsy 04/23/2013 for a clinical T4 N1, stage IIIB invasive ductal carcinoma, grade 3,  estrogen receptor 14% positive with moderate staining intensity, progesterone receptor negative,  with an MIB-1 of  100% and HER-2/neu  nonamplified  (2) biopsy of a left axillary lymph node [phone] was positive  (3) started monthly goserelin July of 02/01/2013  (4)  received  neoadjuvant chemotherapy with doxorubicin and cyclophosphamide in dose dense fashion x4, followed by carboplatin and paclitaxel x1, discontinued due to neuropathy, followed by carboplatin and gemcitabine, both given day 1 of each 14 day cycle x6, completed 10/12/2013 .   (  5) right upper extremity DVT documented 06/29/2013,  treated with Lovenox from  July  through  December 2014    (6) status post left modified radical mastectomy 11/15/2013 for a residual pT3 pN0 invasive ductal carcinoma, grade 3, with repeat prognostic panel triple negative. All 12 axillary lymph nodes were clear. Margins were negative   (5) completed adjuvant radiation therapy 03/18/2014,  with capecitabine added 2-3 weeks into treatment.  METASTATIC DISEASE  (6) CT scans of the chest, abdomen, and pelvis 06/26/2014 showed a large right pleural effusion  with pleural studding, 2 left upper lobe nodules, and two  liver lesions. Cytology from right thoracentesis 07/03/2014 showed malignant cells  consistent with the patient's known primary  (7) started eribulin 07/09/2014, given days 1 and 8 of each 21 day cycle; restagind studies after cycle 3 showed evidence of response. Discontinued after cycle 5 (last dose 10/08/14) because of worsening peripheral neuropathy.   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 Her treatment related anemia is worse this week with a hgb of 9.4, but she is asymptomatic.  We discussed her nausea/vomiting, and she thinks this is resolving for the most part. I reminded her to stay well hydrated, to maintain a bland diet, and that she has prescriptions for both zofran and compazine for her use at home. She will continue to use the oxycodone PRN for her right sided pain, and gabapentin TID for her neuropathy.  [place] is scheduled for a repeat CT scan on 11/23. On 11/24 she will have labs and an office visit to discuss these results with Dr. Jana Hakim. She understands and agrees with this plan. She knows the goal of treatment in her case is control. She has been encouraged to call with any issues that might arise before her next visit here.   Marcelino Duster, [place] [phone] 10/29/2014 1:43 PM

## 2014-11-04 ENCOUNTER — Encounter (HOSPITAL_COMMUNITY): Payer: Self-pay

## 2014-11-04 ENCOUNTER — Ambulatory Visit (HOSPITAL_COMMUNITY)
Admission: RE | Admit: 2014-11-04 | Discharge: 2014-11-04 | Disposition: A | Discharge status: Home / Self Care | Payer: PRIVATE HEALTH INSURANCE | Source: Ambulatory Visit | Attending: Oncology | Admitting: Oncology

## 2014-11-04 DIAGNOSIS — K8020 Calculus of gallbladder without cholecystitis without obstruction: Secondary | ICD-10-CM | POA: Insufficient documentation

## 2014-11-04 DIAGNOSIS — C50912 Malignant neoplasm of unspecified site of left female breast: Secondary | ICD-10-CM | POA: Diagnosis present

## 2014-11-04 DIAGNOSIS — C50212 Malignant neoplasm of upper-inner quadrant of left female breast: Secondary | ICD-10-CM

## 2014-11-04 DIAGNOSIS — I82409 Acute embolism and thrombosis of unspecified deep veins of unspecified lower extremity: Secondary | ICD-10-CM

## 2014-11-04 DIAGNOSIS — Z9012 Acquired absence of left breast and nipple: Secondary | ICD-10-CM | POA: Diagnosis not present

## 2014-11-04 DIAGNOSIS — C7800 Secondary malignant neoplasm of unspecified lung: Secondary | ICD-10-CM | POA: Insufficient documentation

## 2014-11-04 DIAGNOSIS — G620 Drug-induced polyneuropathy: Secondary | ICD-10-CM

## 2014-11-04 DIAGNOSIS — T451X5A Adverse effect of antineoplastic and immunosuppressive drugs, initial encounter: Secondary | ICD-10-CM

## 2014-11-04 DIAGNOSIS — E876 Hypokalemia: Secondary | ICD-10-CM

## 2014-11-04 DIAGNOSIS — C787 Secondary malignant neoplasm of liver and intrahepatic bile duct: Secondary | ICD-10-CM | POA: Insufficient documentation

## 2014-11-04 DIAGNOSIS — C50919 Malignant neoplasm of unspecified site of unspecified female breast: Secondary | ICD-10-CM

## 2014-11-04 DIAGNOSIS — R160 Hepatomegaly, not elsewhere classified: Secondary | ICD-10-CM | POA: Diagnosis not present

## 2014-11-04 MED ORDER — IOHEXOL 300 MG/ML  SOLN
100.0000 mL | Freq: Once | INTRAMUSCULAR | Status: AC | PRN
  Administered 2014-11-04: 100 mL via INTRAVENOUS

## 2014-11-05 ENCOUNTER — Telehealth: Payer: Self-pay | Admitting: *Deleted

## 2014-11-05 ENCOUNTER — Other Ambulatory Visit (HOSPITAL_BASED_OUTPATIENT_CLINIC_OR_DEPARTMENT_OTHER): Payer: No Typology Code available for payment source

## 2014-11-05 ENCOUNTER — Telehealth: Payer: Self-pay | Admitting: Oncology

## 2014-11-05 ENCOUNTER — Ambulatory Visit (HOSPITAL_BASED_OUTPATIENT_CLINIC_OR_DEPARTMENT_OTHER): Payer: No Typology Code available for payment source | Admitting: Oncology

## 2014-11-05 VITALS — BP 105/78 | HR 96 | Temp 98.4°F | Resp 18 | Ht 65.5 in | Wt 185.7 lb

## 2014-11-05 DIAGNOSIS — C50212 Malignant neoplasm of upper-inner quadrant of left female breast: Secondary | ICD-10-CM

## 2014-11-05 DIAGNOSIS — C50911 Malignant neoplasm of unspecified site of right female breast: Secondary | ICD-10-CM

## 2014-11-05 DIAGNOSIS — C773 Secondary and unspecified malignant neoplasm of axilla and upper limb lymph nodes: Secondary | ICD-10-CM

## 2014-11-05 DIAGNOSIS — J910 Malignant pleural effusion: Secondary | ICD-10-CM

## 2014-11-05 DIAGNOSIS — C50912 Malignant neoplasm of unspecified site of left female breast: Secondary | ICD-10-CM

## 2014-11-05 DIAGNOSIS — G620 Drug-induced polyneuropathy: Secondary | ICD-10-CM

## 2014-11-05 LAB — COMPREHENSIVE METABOLIC PANEL (CC13)
ALBUMIN: 2.7 g/dL — AB (ref 3.5–5.0)
ALK PHOS: 150 U/L (ref 40–150)
ALT: 10 U/L (ref 0–55)
AST: 18 U/L (ref 5–34)
Anion Gap: 11 mEq/L (ref 3–11)
BUN: 8.5 mg/dL (ref 7.0–26.0)
CALCIUM: 10.3 mg/dL (ref 8.4–10.4)
CO2: 28 mEq/L (ref 22–29)
Chloride: 100 mEq/L (ref 98–109)
Creatinine: 0.9 mg/dL (ref 0.6–1.1)
Glucose: 107 mg/dl (ref 70–140)
Potassium: 3.6 mEq/L (ref 3.5–5.1)
Sodium: 139 mEq/L (ref 136–145)
Total Bilirubin: 0.3 mg/dL (ref 0.20–1.20)
Total Protein: 8.1 g/dL (ref 6.4–8.3)

## 2014-11-05 LAB — CBC WITH DIFFERENTIAL/PLATELET
BASO%: 0.7 % (ref 0.0–2.0)
Basophils Absolute: 0.1 10*3/uL (ref 0.0–0.1)
EOS%: 4 % (ref 0.0–7.0)
Eosinophils Absolute: 0.3 10*3/uL (ref 0.0–0.5)
HCT: 30.2 % — ABNORMAL LOW (ref 34.8–46.6)
HGB: 9.6 g/dL — ABNORMAL LOW (ref 11.6–15.9)
LYMPH#: 1.5 10*3/uL (ref 0.9–3.3)
LYMPH%: 17.7 % (ref 14.0–49.7)
MCH: 25.9 pg (ref 25.1–34.0)
MCHC: 31.7 g/dL (ref 31.5–36.0)
MCV: 81.7 fL (ref 79.5–101.0)
MONO#: 0.3 10*3/uL (ref 0.1–0.9)
MONO%: 4 % (ref 0.0–14.0)
NEUT#: 6.1 10*3/uL (ref 1.5–6.5)
NEUT%: 73.6 % (ref 38.4–76.8)
Platelets: 672 10*3/uL — ABNORMAL HIGH (ref 145–400)
RBC: 3.7 10*6/uL (ref 3.70–5.45)
RDW: 21.9 % — ABNORMAL HIGH (ref 11.2–14.5)
WBC: 8.3 10*3/uL (ref 3.9–10.3)

## 2014-11-05 NOTE — Telephone Encounter (Signed)
Per staff message and POF I have scheduled appts. Advised scheduler of appts. JMW  

## 2014-11-05 NOTE — Progress Notes (Signed)
ID: Erika [name] OB: [birth year]  MR#: [id number]  CSN#:[id number]  PCP: [name], MD GYN:   SU: Dr. [name] OTHER MD:  CHIEF COMPLAINT:  Newly metastatic breast cancer CURRENT TREATMENT: CMF,  goserelin   BREAST CANCER HISTORY: From Dr. [name] original intake note:  "Patient developed a left sided breast mass show up about 6 months prior to diagnosis. 2 months prior this began getting larger rapidly. She was seen in an urgent care where this mass was thought to be an abscess. Apparently there was an attempt to excise or drain this that was not successful and she was referred to Dr. [name] for evaluation. She underwent punch biopsy on 04/23/13 of her skin and the mass. She was then also sent for mm/us. The biopsy shows invasive high grade carcinoma that appears to be breast. It is ER 14%, PR negative, HER-2/neu negative with a Ki-67 that approaches 100%. She has also undergone mm with at least 10 cm upper inner left breast tumor with mild overyling skin thickening. There is also a 3x10 mm cluster of calcs near there also. There is indeterminate 7 mm level 1 left axillary node, she underwent a lymph node biopsy that was positive for disease. No mm evidence of right breast malignancy. An MRI of the breasts was done on 05/22/13 and showed a 14 cm mass in the left breast protruding from the skin and also a 2.5 cm level II axillary node."  Her subsequent history is as detailed below  INTERVAL HISTORY:   Erika [name] returns today for follow up of her breast cancer, accompanied by her significant other, Erika [name]. We had to stop her eribulin because of neuropathy issues. That has essentially gone back to baseline. She has very minimal numbness in her fingertips and toe tips. This does not affect her quality of life are functioning in daily living. On the other hand we obtained restaging CT scans, which show measurable increase in her liver and lung tumors. Accordingly today we discussed  other treatment options with her.  REVIEW OF SYSTEMS:  Erika [name]  tells me her cough is "gone". She denies unusual headaches, visual changes, dizziness, gait imbalance,  or vomiting problems. Her sense of taste is still not normal. She is using an appetite stimulant and finds that she can eat better and has less nausea. She denies pleurisy, hemoptysis, or worsening shortness of breath. Her pain is controlled on Duragesic 75+25 (she is afraid of switching to 100 patch because it might be "too large to stick") with breakthrough oxycodone. She is not constipated. A detailed review of systems today was otherwise stable  PAST MEDICAL HISTORY: Past Medical History  Diagnosis Date  . Breast cancer     Invasive High Grade Carcinoma  . Headache(784.0)     Hx: of Migraines  . Status post chemotherapy     Adriamycin and Cytoxan  . Anxiety     ANXIOUS ABOUT HAVEING BIG SURGERY - MASTECTOMY  . 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 DVT (deep venous thrombosis)     RT ARM-PT ON LOVENOX  . Hx of iron deficiency anemia 11/26/13  . S/P radiation therapy 02-04-14 to 03-18-14                               1) Left Chest Wall / 50 Gy in 25 fractions/ 2) Left Supraclavicular fossa/ 46 Gy in 23 fractions / 3) Left Posterior Axillary  boost / 8.533 Gy in 23 fractions/ 4) Left Chest Wall Scar boost / 10 Gy in 5 fractions    PAST SURGICAL HISTORY: Past Surgical History  Procedure Laterality Date  . Tubal ligation    . Portacath placement Right 05/22/2013    Procedure: INSERTION PORT-A-CATH;  Surgeon: [name], MD;  Location: [facility];  Service: General;  Laterality: Right;  . Left breast needle core biopsy Left 05/21/13  . Left breast needle core  biopsy Left 06/22/13  . Mastectomy modified radical Left 11/15/2013    Procedure: LEFT MASTECTOMY MODIFIED RADICAL;  Surgeon: [name], MD;  Location: [facility];  Service: General;  Laterality: Left;    FAMILY HISTORY Family History  Problem Relation Age  of Onset  . Hypertension Mother   . Diabetes Father 22  . Stomach cancer Maternal Aunt   . Hypertension Maternal Grandmother   . Hypertension Maternal Grandfather   . Diabetes Paternal Grandmother   . Diabetes Paternal Grandfather   . Diabetes Paternal Uncle   . Diabetes Paternal Uncle   . Diabetes Paternal Uncle   The patient's mother is alive, age 61. The patient's father died from complications of diabetes at the age of 55. The patient has 2 brothers, one sister. There is no history of breast or ovarian cancer in the family to her knowledge .the patient has not undergone genetic testing   GYNECOLOGIC HISTORY:  Menarche age 61, first live birth age 59. The patient is GX P3. The patient has been on goserelin since 06/13/2013, given monthly  SOCIAL HISTORY:  The patient has worked at as a Furniture conservator/restorer and a Retail buyer. She is currently applying for disability. At home she lives with her youngest son, Erika [name], 21; with [name]; and with [name].   ADVANCED DIRECTIVES: Not in place  HEALTH MAINTENANCE: History  Substance Use Topics  . Smoking status: Never Smoker   . Smokeless tobacco: Never Used  . Alcohol Use: No     Mammogram: 06/22/2013 Colonoscopy: Bone Density Scan:  Pap Smear:  Eye Exam:  Vitamin D Level:   Lipid Panel:    Allergies  Allergen Reactions  . Doxycycline Nausea And Vomiting    Current Outpatient Prescriptions  Medication Sig Dispense Refill  . cyclobenzaprine (FLEXERIL) 5 MG tablet TAKE 1 TABLET BY MOUTH THREE TIMES A DAY FOR MUSCLE SPASMS 30 tablet 1  . fentaNYL (DURAGESIC - DOSED MCG/HR) 25 MCG/HR patch Place 1 patch (25 mcg total) onto the skin every 3 (three) days. 10 patch 0  . fentaNYL (DURAGESIC - DOSED MCG/HR) 75 MCG/HR Place 1 patch (75 mcg total) onto the skin every 3 (three) days. 10 patch 0  . gabapentin (NEURONTIN) 300 MG capsule Take 1 capsule (300 mg total) by mouth 3 (three) times  daily. 90 capsule 4  . HYDROcodone-homatropine (HYCODAN) 5-1.5 MG/5ML syrup Take 5 mLs by mouth every 6 (six) hours as needed for cough. 240 mL 0  . ibuprofen (ADVIL,MOTRIN) 200 MG tablet Take 600-800 mg by mouth every 4 (four) hours as needed for mild pain or moderate pain.     Marland Kitchen loratadine (CLARITIN) 10 MG tablet Take 10 mg by mouth daily as needed for allergies.    Marland Kitchen LORazepam (ATIVAN) 0.5 MG tablet Take 0.5 mg by mouth every 6 (six) hours as needed for anxiety.    Marland Kitchen oxyCODONE (OXY IR/ROXICODONE) 5 MG immediate release tablet Take 1 tablet (5 mg total) by mouth every 8 (eight) hours  as needed for severe pain or breakthrough pain. 90 tablet 0  . potassium chloride SA (K-DUR,KLOR-CON) 20 MEQ tablet Take 1 tablet (20 mEq total) by mouth 4 (four) times daily. 120 tablet 1  . PRESCRIPTION MEDICATION PT REPORTS RECEIVING A SHOT ONCE A MONTH AT CANCER CENTER TO STOP HER MENSTRUAL PERIODS - I SEE DOCUMENTATION IN OFFICE NOTES FROM CANCER CENTER THAT PT WAS GETTING ZOLADEX SQ EVERY 28 DAYS.    Marland Kitchen traMADol (ULTRAM) 50 MG tablet Take 1-2 tablets (50-100 mg total) by mouth every 6 (six) hours as needed. 30 tablet 1  . UNABLE TO FIND Rx: L8000- Post Surgical Bra (Quantity: 6) Q5956- Non Silicone Breast prosthesis (Quantity: 1) Dx: 174.9; Left mastectomy 1 each 0  . zolpidem (AMBIEN) 5 MG tablet Take 1 tablet (5 mg total) by mouth at bedtime as needed for sleep. 30 tablet 0   No current facility-administered medications for this visit.   Facility-Administered Medications Ordered in Other Visits  Medication Dose Route Frequency Provider Last Rate Last Dose  . topical emolient (BIAFINE) emulsion   Topical Daily [name], MD        OBJECTIVE: middle-aged African American woman in no acute distress Filed Vitals:   11/05/14 1124  BP: 105/78  Pulse: 96  Temp: 98.4 F (36.9 C)  Resp: 18     Body mass index is 30.42 kg/(m^2).      ECOG FS:1 - Symptomatic but completely ambulatory   Sclerae  unicteric, EOMs intact Oropharynx clear, no thrush or other lesions No cervical or supraclavicular adenopathy Lungs no rales or rhonchi Heart regular rate and rhythm Abd soft, nontender, positive bowel sounds MSK no focal spinal tenderness Neuro: nonfocal, well oriented, positive affect Breasts: The right breast is unremarkable. The left breast is status post mastectomy and radiation. There is significant hyperpigmentation over the radiation port area, which is very slowly improving. There is no evidence of chest wall recurrence. The left axilla is benign.    LAB RESULTS:  CMP     Component Value Date/Time   NA 139 11/05/2014 1101   NA 138 11/16/2013 0448   K 3.6 11/05/2014 1101   K 3.5 11/16/2013 0448   CL 103 11/16/2013 0448   CL 102 06/01/2013 1500   CO2 28 11/05/2014 1101   CO2 27 11/16/2013 0448   GLUCOSE 107 11/05/2014 1101   GLUCOSE 116* 11/16/2013 0448   GLUCOSE 95 06/01/2013 1500   BUN 8.5 11/05/2014 1101   BUN 8 11/16/2013 0448   CREATININE 0.9 11/05/2014 1101   CREATININE 0.86 11/16/2013 0448   CALCIUM 10.3 11/05/2014 1101   CALCIUM 8.9 11/16/2013 0448   PROT 8.1 11/05/2014 1101   PROT 8.2 05/18/2013 1205   ALBUMIN 2.7* 11/05/2014 1101   ALBUMIN 2.8* 05/18/2013 1205   AST 18 11/05/2014 1101   AST 18 05/18/2013 1205   ALT 10 11/05/2014 1101   ALT 16 05/18/2013 1205   ALKPHOS 150 11/05/2014 1101   ALKPHOS 111 05/18/2013 1205   BILITOT 0.30 11/05/2014 1101   BILITOT 0.2* 05/18/2013 1205   GFRNONAA 82* 11/16/2013 0448   GFRAA >90 11/16/2013 0448    I No results found for: SPEP  Lab Results  Component Value Date   WBC 8.3 11/05/2014   NEUTROABS 6.1 11/05/2014   HGB 9.6* 11/05/2014   HCT 30.2* 11/05/2014   MCV 81.7 11/05/2014   PLT 672* 11/05/2014      Chemistry      Component Value Date/Time   NA  139 11/05/2014 1101   NA 138 11/16/2013 0448   K 3.6 11/05/2014 1101   K 3.5 11/16/2013 0448   CL 103 11/16/2013 0448   CL 102 06/01/2013 1500    CO2 28 11/05/2014 1101   CO2 27 11/16/2013 0448   BUN 8.5 11/05/2014 1101   BUN 8 11/16/2013 0448   CREATININE 0.9 11/05/2014 1101   CREATININE 0.86 11/16/2013 0448      Component Value Date/Time   CALCIUM 10.3 11/05/2014 1101   CALCIUM 8.9 11/16/2013 0448   ALKPHOS 150 11/05/2014 1101   ALKPHOS 111 05/18/2013 1205   AST 18 11/05/2014 1101   AST 18 05/18/2013 1205   ALT 10 11/05/2014 1101   ALT 16 05/18/2013 1205   BILITOT 0.30 11/05/2014 1101   BILITOT 0.2* 05/18/2013 1205       Lab Results  Component Value Date   LABCA2 35 05/18/2013    No components found for: EUMPN361  No results for input(s): INR in the last 168 hours.  Urinalysis No results found for: COLORURINE  STUDIES: Dg Chest 2 View  10/09/2014   CLINICAL DATA:  Metastatic breast cancer.  Cough.  EXAM: CHEST  2 VIEW  COMPARISON:  Chest radiograph of July 03, 2014 ; CT scan of September 03, 2014.  FINDINGS: Stable cardiomediastinal silhouette. Left lung appears clear. Surgical clips are noted in left axillary region. Stable right subclavian Port-A-Cath with distal tip overlying expected position of the SVC. Minimal right pleural effusion is noted with associated pleural thickening concerning for metastatic disease based on prior CT scan. No pneumothorax is noted.  IMPRESSION: Minimal right pleural effusion is noted with associated pleural thickening in right hemi thorax concerning for metastatic disease based on prior CT scan.   Electronically Signed   By: Sabino Dick M.D.   On: 10/09/2014 08:01   Ct Chest W Contrast  11/04/2014   CLINICAL DATA:  Left breast cancer, chemotherapy and ongoing.  EXAM: CT CHEST, ABDOMEN, AND PELVIS WITH CONTRAST  TECHNIQUE: Multidetector CT imaging of the chest, abdomen and pelvis was performed following the standard protocol during bolus administration of intravenous contrast.  CONTRAST:  159m OMNIPAQUE IOHEXOL 300 MG/ML  SOLN  COMPARISON:  09/03/2014.  FINDINGS: CT CHEST FINDINGS   Right IJ Port-A-Cath terminates in the low SVC. No pathologically enlarged mediastinal, hilar or axillary lymph nodes. Surgical clips in the left axilla. Postoperative changes of left mastectomy. Heart is mildly enlarged. Left ventricle appears dilated. No pericardial effusion.  Pleural nodularity and a small amount of associated complex fluid have progressed in the right hemi thorax. There is mass effect on the right hepatic lobe. Index pleural nodule along the right mediastinal border measures 1.0 x 1.8 cm (image 21), previously 0.7 x 1.2 cm. Mild changes of radiation fibrosis in the anterior left upper lobe. No left pleural fluid. Airway is unremarkable.  CT ABDOMEN AND PELVIS FINDINGS  Hepatobiliary: Intermediate density lesions in the right hepatic lobe measure up to 2.0 x 3.3 cm (image 56), previously 1.9 x 2.7 cm). Liver measures 23.9 cm. Multiple stones are seen in the gallbladder. No biliary ductal dilatation.  Pancreas: Negative.  Spleen: Negative.  Adrenals/Urinary Tract: Adrenal glands and kidneys are unremarkable. Ureters are decompressed. Bladder is low in volume.  Stomach/Bowel: Stomach, small bowel, appendix and colon are unremarkable.  Vascular/Lymphatic: Left renal vein appears circumaortic. Vascular structures are otherwise unremarkable. No pathologically enlarged lymph nodes.  Reproductive: Uterus and ovaries are visualized.  Other: No free fluid.  Mesenteries  and peritoneum are unremarkable.  Musculoskeletal: No worrisome lytic or sclerotic lesions.  IMPRESSION: 1. Progressive right pleural and hepatic metastatic disease. 2. Hepatomegaly. 3. Chololithiasis.   Electronically Signed   By: Lorin Picket M.D.   On: 11/04/2014 12:25   Ct Abdomen Pelvis W Contrast  11/04/2014   CLINICAL DATA:  Left breast cancer, chemotherapy and ongoing.  EXAM: CT CHEST, ABDOMEN, AND PELVIS WITH CONTRAST  TECHNIQUE: Multidetector CT imaging of the chest, abdomen and pelvis was performed following the standard  protocol during bolus administration of intravenous contrast.  CONTRAST:  153m OMNIPAQUE IOHEXOL 300 MG/ML  SOLN  COMPARISON:  09/03/2014.  FINDINGS: CT CHEST FINDINGS  Right IJ Port-A-Cath terminates in the low SVC. No pathologically enlarged mediastinal, hilar or axillary lymph nodes. Surgical clips in the left axilla. Postoperative changes of left mastectomy. Heart is mildly enlarged. Left ventricle appears dilated. No pericardial effusion.  Pleural nodularity and a small amount of associated complex fluid have progressed in the right hemi thorax. There is mass effect on the right hepatic lobe. Index pleural nodule along the right mediastinal border measures 1.0 x 1.8 cm (image 21), previously 0.7 x 1.2 cm. Mild changes of radiation fibrosis in the anterior left upper lobe. No left pleural fluid. Airway is unremarkable.  CT ABDOMEN AND PELVIS FINDINGS  Hepatobiliary: Intermediate density lesions in the right hepatic lobe measure up to 2.0 x 3.3 cm (image 56), previously 1.9 x 2.7 cm). Liver measures 23.9 cm. Multiple stones are seen in the gallbladder. No biliary ductal dilatation.  Pancreas: Negative.  Spleen: Negative.  Adrenals/Urinary Tract: Adrenal glands and kidneys are unremarkable. Ureters are decompressed. Bladder is low in volume.  Stomach/Bowel: Stomach, small bowel, appendix and colon are unremarkable.  Vascular/Lymphatic: Left renal vein appears circumaortic. Vascular structures are otherwise unremarkable. No pathologically enlarged lymph nodes.  Reproductive: Uterus and ovaries are visualized.  Other: No free fluid.  Mesenteries and peritoneum are unremarkable.  Musculoskeletal: No worrisome lytic or sclerotic lesions.  IMPRESSION: 1. Progressive right pleural and hepatic metastatic disease. 2. Hepatomegaly. 3. Chololithiasis.   Electronically Signed   By: MLorin PicketM.D.   On: 11/04/2014 12:25    ASSESSMENT: 43y.o.  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  (0) genetic testing of the ATM, BARD1, BRCA1, BRCA2, BRIP1, CDH1, CHEK2, EPCAM, FANCC, MLH1, MSH2, MSH6, NBN, PALB2, PMS2, PTEN, RAD51C, RAD51D, STK11, TP53, and XRCC2 genes showed no deleterious mutations  (1) status post left breast biopsy 04/23/2013 for a clinical T4 N1, stage IIIB invasive ductal carcinoma, grade 3,  estrogen receptor 14% positive with moderate staining intensity, progesterone receptor negative,  with an MIB-1 of  100% and HER-2/neu  nonamplified  (2) biopsy of a left axillary lymph node [id number] positive  (3) started monthly goserelin July of 02/01/2013  (4)  received  neoadjuvant chemotherapy with doxorubicin and cyclophosphamide in dose dense fashion x4, followed by carboplatin and paclitaxel x1, discontinued due to neuropathy, followed by carboplatin and gemcitabine, both given day 1 of each 14 day cycle x6, completed 10/12/2013 .   (5) right upper extremity DVT documented 06/29/2013,  treated with Lovenox from  July  through  December 2014    (6) status post left modified radical mastectomy 11/15/2013 for a residual pT3 pN0 invasive ductal carcinoma, grade 3, with repeat prognostic panel triple negative. All 12 axillary lymph nodes were clear. Margins were negative   (5) completed adjuvant radiation therapy 03/18/2014,  with capecitabine added 2-3  weeks into treatment.  METASTATIC DISEASE  (6) CT scans of the chest, abdomen, and pelvis 06/26/2014 showed a large right pleural effusion  with pleural studding, 2 left upper lobe nodules, and two  liver lesions. Cytology from right thoracentesis 07/03/2014 showed malignant cells consistent with the patient's known primary  (7) started eribulin 07/09/2014, given days 1 and 8 of each 21 day cycle; restagind studies after cycle 3 showed evidence of response. Discontinued after cycle 5 (last dose 10/08/14) because of worsening peripheral neuropathy--also with progression.   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 In any case we would not be able to give her any more eribulin because of the peripheral neuropathy she develop. Thankfully that now has gone back to baseline (grade 1).  We discussed several options which she might be able to tolerate without worsening the peripheral neuropathy. These include CMF, going back to [place] and Gemzar, or possibly Doxil. Of all these I think she would tolerate the CMF best and we are going to start on December 7.   We did discuss the possible toxicities, side effects and complications of these agents. She will try this standard supportive medicines, but if she tolerates the first cycle particularly well we may omit the dexamethasone day 2 through 4 and simply rely on ondansetron and prochlorperazine.  I have encouraged her to continue her walking program. She will let us know if any unusual symptoms develop. Otherwise the plan will be to try to get through 4 cycles of CMF and then restage.  She has a good understanding of the overall plan. She agrees with it. She knows the goal of treatment is control. She will call with any problems that may develop before her next visit here.  [name], [place] [phone] 11/05/2014 11:46 AM

## 2014-11-05 NOTE — Telephone Encounter (Signed)
per pof to sch pt appt-gave pt copy of sch-sent MW email to sch pt trmt

## 2014-11-06 ENCOUNTER — Other Ambulatory Visit: Payer: Self-pay | Admitting: *Deleted

## 2014-11-11 ENCOUNTER — Other Ambulatory Visit: Payer: Self-pay | Admitting: *Deleted

## 2014-11-11 DIAGNOSIS — C50212 Malignant neoplasm of upper-inner quadrant of left female breast: Secondary | ICD-10-CM

## 2014-11-11 MED ORDER — OXYCODONE HCL 5 MG PO TABS: 5.0000 mg | ORAL_TABLET | Freq: Three times a day (TID) | ORAL | Status: DC | PRN

## 2014-11-11 MED ORDER — FENTANYL 75 MCG/HR TD PT72: 75.0000 ug | MEDICATED_PATCH | TRANSDERMAL | Status: DC

## 2014-11-11 MED ORDER — FENTANYL 25 MCG/HR TD PT72: 25.0000 ug | MEDICATED_PATCH | TRANSDERMAL | Status: DC

## 2014-11-15 ENCOUNTER — Ambulatory Visit: Payer: PRIVATE HEALTH INSURANCE

## 2014-11-19 ENCOUNTER — Ambulatory Visit (HOSPITAL_BASED_OUTPATIENT_CLINIC_OR_DEPARTMENT_OTHER): Payer: No Typology Code available for payment source

## 2014-11-19 ENCOUNTER — Encounter: Payer: Self-pay | Admitting: Nurse Practitioner

## 2014-11-19 ENCOUNTER — Ambulatory Visit (HOSPITAL_BASED_OUTPATIENT_CLINIC_OR_DEPARTMENT_OTHER): Payer: No Typology Code available for payment source | Admitting: Nurse Practitioner

## 2014-11-19 ENCOUNTER — Telehealth: Payer: Self-pay | Admitting: *Deleted

## 2014-11-19 ENCOUNTER — Telehealth: Payer: Self-pay | Admitting: Nurse Practitioner

## 2014-11-19 ENCOUNTER — Other Ambulatory Visit (HOSPITAL_BASED_OUTPATIENT_CLINIC_OR_DEPARTMENT_OTHER): Payer: No Typology Code available for payment source

## 2014-11-19 VITALS — BP 114/72 | HR 84 | Temp 98.2°F | Resp 18 | Ht 65.0 in | Wt 180.3 lb

## 2014-11-19 DIAGNOSIS — C50912 Malignant neoplasm of unspecified site of left female breast: Secondary | ICD-10-CM

## 2014-11-19 DIAGNOSIS — E876 Hypokalemia: Secondary | ICD-10-CM

## 2014-11-19 DIAGNOSIS — G893 Neoplasm related pain (acute) (chronic): Secondary | ICD-10-CM

## 2014-11-19 DIAGNOSIS — Z5111 Encounter for antineoplastic chemotherapy: Secondary | ICD-10-CM

## 2014-11-19 DIAGNOSIS — C50212 Malignant neoplasm of upper-inner quadrant of left female breast: Secondary | ICD-10-CM

## 2014-11-19 DIAGNOSIS — Z170 Estrogen receptor positive status [ER+]: Secondary | ICD-10-CM

## 2014-11-19 DIAGNOSIS — G620 Drug-induced polyneuropathy: Secondary | ICD-10-CM

## 2014-11-19 DIAGNOSIS — C773 Secondary and unspecified malignant neoplasm of axilla and upper limb lymph nodes: Secondary | ICD-10-CM

## 2014-11-19 LAB — COMPREHENSIVE METABOLIC PANEL (CC13)
ALK PHOS: 130 U/L (ref 40–150)
ALT: 8 U/L (ref 0–55)
AST: 17 U/L (ref 5–34)
Albumin: 2.7 g/dL — ABNORMAL LOW (ref 3.5–5.0)
Anion Gap: 12 mEq/L — ABNORMAL HIGH (ref 3–11)
BUN: 8.8 mg/dL (ref 7.0–26.0)
CO2: 27 mEq/L (ref 22–29)
CREATININE: 0.8 mg/dL (ref 0.6–1.1)
Calcium: 10.2 mg/dL (ref 8.4–10.4)
Chloride: 101 mEq/L (ref 98–109)
EGFR: >90 mL/min/{1.73_m2} (ref >90)
Glucose: 89 mg/dl (ref 70–140)
Potassium: 3.9 mEq/L (ref 3.5–5.1)
Sodium: 139 mEq/L (ref 136–145)
Total Bilirubin: 0.26 mg/dL (ref 0.20–1.20)
Total Protein: 8.3 g/dL (ref 6.4–8.3)

## 2014-11-19 LAB — CBC WITH DIFFERENTIAL/PLATELET
BASO%: 0.8 % (ref 0.0–2.0)
BASOS ABS: 0.1 10*3/uL (ref 0.0–0.1)
EOS%: 3.6 % (ref 0.0–7.0)
Eosinophils Absolute: 0.2 10*3/uL (ref 0.0–0.5)
HCT: 30.3 % — ABNORMAL LOW (ref 34.8–46.6)
HEMOGLOBIN: 9.3 g/dL — AB (ref 11.6–15.9)
LYMPH%: 18.8 % (ref 14.0–49.7)
MCH: 24.9 pg — AB (ref 25.1–34.0)
MCHC: 30.8 g/dL — ABNORMAL LOW (ref 31.5–36.0)
MCV: 80.9 fL (ref 79.5–101.0)
MONO#: 0.5 10*3/uL (ref 0.1–0.9)
MONO%: 7.3 % (ref 0.0–14.0)
NEUT%: 69.5 % (ref 38.4–76.8)
NEUTROS ABS: 4.4 10*3/uL (ref 1.5–6.5)
PLATELETS: 645 10*3/uL — AB (ref 145–400)
RBC: 3.74 10*6/uL (ref 3.70–5.45)
RDW: 20.8 % — AB (ref 11.2–14.5)
WBC: 6.3 10*3/uL (ref 3.9–10.3)
lymph#: 1.2 10*3/uL (ref 0.9–3.3)

## 2014-11-19 MED ORDER — METHOTREXATE SODIUM CHEMO INJECTION 25 MG/ML
40.0000 mg/m2 | Freq: Once | INTRAMUSCULAR | Status: AC
  Administered 2014-11-19: 80 mg via INTRAVENOUS
  Filled 2014-11-19: qty 3.2

## 2014-11-19 MED ORDER — CYCLOPHOSPHAMIDE CHEMO INJECTION 1 GM
600.0000 mg/m2 | Freq: Once | INTRAMUSCULAR | Status: AC
  Administered 2014-11-19: 1180 mg via INTRAVENOUS
  Filled 2014-11-19: qty 59

## 2014-11-19 MED ORDER — DEXAMETHASONE SODIUM PHOSPHATE 10 MG/ML IJ SOLN
10.0000 mg | Freq: Once | INTRAMUSCULAR | Status: AC
  Administered 2014-11-19: 10 mg via INTRAVENOUS

## 2014-11-19 MED ORDER — HEPARIN SOD (PORK) LOCK FLUSH 100 UNIT/ML IV SOLN
500.0000 [IU] | Freq: Once | INTRAVENOUS | Status: AC | PRN
  Administered 2014-11-19: 500 [IU]
  Filled 2014-11-19: qty 5

## 2014-11-19 MED ORDER — SODIUM CHLORIDE 0.9 % IV SOLN
Freq: Once | INTRAVENOUS | Status: AC
  Administered 2014-11-19: 11:00:00 via INTRAVENOUS

## 2014-11-19 MED ORDER — ONDANSETRON 8 MG/50ML IVPB (CHCC)
8.0000 mg | Freq: Once | INTRAVENOUS | Status: AC
  Administered 2014-11-19: 8 mg via INTRAVENOUS

## 2014-11-19 MED ORDER — DEXAMETHASONE SODIUM PHOSPHATE 10 MG/ML IJ SOLN
INTRAMUSCULAR | Status: AC
  Filled 2014-11-19: qty 1

## 2014-11-19 MED ORDER — SODIUM CHLORIDE 0.9 % IJ SOLN
10.0000 mL | INTRAMUSCULAR | Status: DC | PRN
  Administered 2014-11-19: 10 mL
  Filled 2014-11-19: qty 10

## 2014-11-19 MED ORDER — FLUOROURACIL CHEMO INJECTION 2.5 GM/50ML
600.0000 mg/m2 | Freq: Once | INTRAVENOUS | Status: AC
  Administered 2014-11-19: 1200 mg via INTRAVENOUS
  Filled 2014-11-19: qty 24

## 2014-11-19 MED ORDER — ONDANSETRON 8 MG/NS 50 ML IVPB
INTRAVENOUS | Status: AC
  Filled 2014-11-19: qty 8

## 2014-11-19 NOTE — Telephone Encounter (Signed)
per pof to sch pt appt-gave pt copy of sch-sent MW email to sch trmt °

## 2014-11-19 NOTE — Patient Instructions (Signed)
[place] Discharge Instructions for Patients Receiving Chemotherapy  Today you received the following chemotherapy agents cytoxan/methotrexate/fluorouracil.  To help prevent nausea and vomiting after your treatment, we encourage you to take your nausea medication as directed   If you develop nausea and vomiting that is not controlled by your nausea medication, call the clinic.   BELOW ARE SYMPTOMS THAT SHOULD BE REPORTED IMMEDIATELY:  *FEVER GREATER THAN 100.5 F  *CHILLS WITH OR WITHOUT FEVER  NAUSEA AND VOMITING THAT IS NOT CONTROLLED WITH YOUR NAUSEA MEDICATION  *UNUSUAL SHORTNESS OF BREATH  *UNUSUAL BRUISING OR BLEEDING  TENDERNESS IN MOUTH AND THROAT WITH OR WITHOUT PRESENCE OF ULCERS  *URINARY PROBLEMS  *BOWEL PROBLEMS  UNUSUAL RASH Items with * indicate a potential emergency and should be followed up as soon as possible.  Feel free to call the clinic you have any questions or concerns. The clinic phone number is [phone].  Cyclophosphamide injection What is this medicine? CYCLOPHOSPHAMIDE (sye kloe FOSS fa mide) is a chemotherapy drug. It slows the growth of cancer cells. This medicine is used to treat many types of cancer like lymphoma, myeloma, leukemia, breast cancer, and ovarian cancer, to name a few. This medicine may be used for other purposes; ask your health care provider or pharmacist if you have questions. COMMON BRAND NAME(S): Cytoxan, Neosar What should I tell my health care provider before I take this medicine? They need to know if you have any of these conditions: -blood disorders -history of other chemotherapy -infection -kidney disease -liver disease -recent or ongoing radiation therapy -tumors in the bone marrow -an unusual or allergic reaction to cyclophosphamide, other chemotherapy, other medicines, foods, dyes, or preservatives -pregnant or trying to get pregnant -breast-feeding How should I use this medicine? This  drug is usually given as an injection into a vein or muscle or by infusion into a vein. It is administered in a hospital or clinic by a specially trained health care professional. Talk to your pediatrician regarding the use of this medicine in children. Special care may be needed. Overdosage: If you think you have taken too much of this medicine contact a poison control center or emergency room at once. NOTE: This medicine is only for you. Do not share this medicine with others. What if I miss a dose? It is important not to miss your dose. Call your doctor or health care professional if you are unable to keep an appointment. What may interact with this medicine? This medicine may interact with the following medications: -amiodarone -amphotericin B -azathioprine -certain antiviral medicines for HIV or AIDS such as protease inhibitors (e.g., indinavir, ritonavir) and zidovudine -certain blood pressure medications such as benazepril, captopril, enalapril, fosinopril, lisinopril, moexipril, monopril, perindopril, quinapril, ramipril, trandolapril -certain cancer medications such as anthracyclines (e.g., daunorubicin, doxorubicin), busulfan, cytarabine, paclitaxel, pentostatin, tamoxifen, trastuzumab -certain diuretics such as chlorothiazide, chlorthalidone, hydrochlorothiazide, indapamide, metolazone -certain medicines that treat or prevent blood clots like warfarin -certain muscle relaxants such as succinylcholine -cyclosporine -etanercept -indomethacin -medicines to increase blood counts like filgrastim, pegfilgrastim, sargramostim -medicines used as general anesthesia -metronidazole -natalizumab This list may not describe all possible interactions. Give your health care provider a list of all the medicines, herbs, non-prescription drugs, or dietary supplements you use. Also tell them if you smoke, drink alcohol, or use illegal drugs. Some items may interact with your medicine. What should I  watch for while using this medicine? Visit your doctor for checks on your progress. This drug may make you feel  generally unwell. This is not uncommon, as chemotherapy can affect healthy cells as well as cancer cells. Report any side effects. Continue your course of treatment even though you feel ill unless your doctor tells you to stop. Drink water or other fluids as directed. Urinate often, even at night. In some cases, you may be given additional medicines to help with side effects. Follow all directions for their use. Call your doctor or health care professional for advice if you get a fever, chills or sore throat, or other symptoms of a cold or flu. Do not treat yourself. This drug decreases your body's ability to fight infections. Try to avoid being around people who are sick. This medicine may increase your risk to bruise or bleed. Call your doctor or health care professional if you notice any unusual bleeding. Be careful brushing and flossing your teeth or using a toothpick because you may get an infection or bleed more easily. If you have any dental work done, tell your dentist you are receiving this medicine. You may get drowsy or dizzy. Do not drive, use machinery, or do anything that needs mental alertness until you know how this medicine affects you. Do not become pregnant while taking this medicine or for 1 year after stopping it. Women should inform their doctor if they wish to become pregnant or think they might be pregnant. Men should not father a child while taking this medicine and for 4 months after stopping it. There is a potential for serious side effects to an unborn child. Talk to your health care professional or pharmacist for more information. Do not breast-feed an infant while taking this medicine. This medicine may interfere with the ability to have a child. This medicine has caused ovarian failure in some women. This medicine has caused reduced sperm counts in some men. You  should talk with your doctor or health care professional if you are concerned about your fertility. If you are going to have surgery, tell your doctor or health care professional that you have taken this medicine. What side effects may I notice from receiving this medicine? Side effects that you should report to your doctor or health care professional as soon as possible: -allergic reactions like skin rash, itching or hives, swelling of the face, lips, or tongue -low blood counts - this medicine may decrease the number of white blood cells, red blood cells and platelets. You may be at increased risk for infections and bleeding. -signs of infection - fever or chills, cough, sore throat, pain or difficulty passing urine -signs of decreased platelets or bleeding - bruising, pinpoint red spots on the skin, black, tarry stools, blood in the urine -signs of decreased red blood cells - unusually weak or tired, fainting spells, lightheadedness -breathing problems -dark urine -dizziness -palpitations -swelling of the ankles, feet, hands -trouble passing urine or change in the amount of urine -weight gain -yellowing of the eyes or skin Side effects that usually do not require medical attention (report to your doctor or health care professional if they continue or are bothersome): -changes in nail or skin color -hair loss -missed menstrual periods -mouth sores -nausea, vomiting This list may not describe all possible side effects. Call your doctor for medical advice about side effects. You may report side effects to FDA at 1-800-FDA-1088. Where should I keep my medicine? This drug is given in a hospital or clinic and will not be stored at home. NOTE: This sheet is a summary. It may not cover  all possible information. If you have questions about this medicine, talk to your doctor, pharmacist, or health care provider.  2015, [employer]. (2012-10-13 16:22:58)  Methotrexate injection What  is this medicine? METHOTREXATE (METH oh TREX ate) is a chemotherapy drug. This medicine affects cells that are rapidly growing, such as cancer cells and cells in your mouth and stomach. It is used to treat many cancers and other medical conditions. It is used for leukemias, lymphomas, breast cancer, lung cancer, head and neck cancers, and other cancers. This medicine also works on the immune system and is commonly used to treat psoriasis and rheumatoid arthritis. This medicine may be used for other purposes; ask your health care provider or pharmacist if you have questions. What should I tell my health care provider before I take this medicine? They need to know if you have any of these conditions: -if you frequently drink alcohol containing drinks -infection (especially a virus infection such as chickenpox, cold sores, or herpes) -immune system problems -kidney disease -liver disease -low blood counts, like platelets, red bloods, or white blood cells -lung disease -recent or ongoing radiation therapy -an unusual or allergic reaction to methotrexate, benzyl alcohol, other medicines, foods, dyes, or preservatives -pregnant or trying to get pregnant -breast-feeding How should I use this medicine? This drug is given as an injection into a muscle or into a vein. It may also be given into the spinal fluid. It is administered in a hospital or clinic by a specially trained health care professional. Talk to your pediatrician regarding the use of this medicine in children. While this drug may be prescribed for selected conditions, precautions do apply. Overdosage: If you think you have taken too much of this medicine contact a poison control center or emergency room at once. NOTE: This medicine is only for you. Do not share this medicine with others. What if I miss a dose? It is important not to miss your dose. Call your doctor or health care professional if you are unable to keep an appointment. What  may interact with this medicine? -antibiotics and other medicines for infections -aspirin and aspirin-like medicines including bismuth subsalicylate (Pepto-Bismol) -cisplatin -dapsone -folic acid in supplements or vitamins -mercaptopurine -NSAIDs, medicines for pain and inflammation, like ibuprofen or naproxen -pemetrexed -phenylbutazone -phenytoin -probenecid -pyrimethamine -theophylline -trimetrexate -vaccines This list may not describe all possible interactions. Give your health care provider a list of all the medicines, herbs, non-prescription drugs, or dietary supplements you use. Also tell them if you smoke, drink alcohol, or use illegal drugs. Some items may interact with your medicine. What should I watch for while using this medicine? Visit your doctor for checks on your progress. You will need to have regular blood checks during your treatment to monitor your blood, liver function, and kidney function. This drug may make you feel generally unwell. This is not uncommon, as chemotherapy can affect healthy cells as well as cancer cells. Report any side effects. Continue your course of treatment even though you feel ill unless your doctor tells you to stop. In some cases, you may be given additional medicines to help with side effects. Follow all directions for their use. Call your doctor or health care professional for advice if you get a fever, chills or sore throat, or other symptoms of a cold or flu. Do not treat yourself. This drug decreases your body's ability to fight infections. Try to avoid being around people who are sick. This medicine may increase your risk to bruise  or bleed. Call your doctor or health care professional if you notice any unusual bleeding. Be careful brushing and flossing your teeth or using a toothpick because you may get an infection or bleed more easily. If you have any dental work done, tell your dentist you are receiving this medicine. Avoid taking  products that contain aspirin, acetaminophen, ibuprofen, naproxen, or ketoprofen unless instructed by your doctor. These medicines may hide a fever. This medicine can make you more sensitive to the sun. Keep out of the sun. If you cannot avoid being in the sun, wear protective clothing and use sunscreen. Do not use sun lamps or tanning beds/booths. Do not treat diarrhea with over the counter products. Contact your doctor if you have diarrhea. To protect your kidneys, drink water or other fluids as directed while you are taking this medicine. Do not drink alcohol-containing drinks while taking this medicine. Both alcohol and the medicine may cause damage to your liver. Men and women must use effective birth control while they are taking this medicine. Do not become pregnant while taking this medicine. Women must continue using effective birth control for 1 full menstrual cycle after stopping this medicine. Tell your doctor right away if you think that you or your partner might be pregnant. There is a potential for serious side effects to an unborn child. Talk to your health care professional or pharmacist for more information. Do not breast-feed an infant while taking this medicine. Men must continue effective birth control for 3 months after stopping this medicine. What side effects may I notice from receiving this medicine? Side effects that you should report to your doctor or health care professional as soon as possible: -allergic reactions like skin rash, itching or hives, swelling of the face, lips, or tongue -low blood counts - this medicine may decrease the number of white blood cells, red blood cells and platelets. You may be at increased risk for infections and bleeding. -signs of infection - fever or chills, cough, sore throat, pain or difficulty passing urine -signs of decreased platelets or bleeding - bruising, pinpoint red spots on the skin, black, tarry stools, blood in the urine -signs of  decreased red blood cells - unusually weak or tired, fainting spells, lightheadedness -breathing problems, like a dry cough -changes in vision -confusion, not alert -diarrhea -mouth or throat sores or ulcers -problems with balance, talking, walking -redness, blistering, peeling or loosening of the skin, including inside the mouth -seizures -trouble passing urine or change in the amount of urine -vomiting -yellowing of the eyes or skin Side effects that usually do not require medical attention (report to your doctor or health care professional if they continue or are bothersome): -change in skin color -eye irritation -hair loss -headache -loss of appetite -nausea -stomach upset This list may not describe all possible side effects. Call your doctor for medical advice about side effects. You may report side effects to FDA at 1-800-FDA-1088. Where should I keep my medicine? This drug is given in a hospital or clinic and will not be stored at home. NOTE: This sheet is a summary. It may not cover all possible information. If you have questions about this medicine, talk to your doctor, pharmacist, or health care provider.  2015, [employer]. (2008-06-06 11:13:24)  Fluorouracil, 5-FU injection What is this medicine? FLUOROURACIL, 5-FU (flure oh YOOR a sil) is a chemotherapy drug. It slows the growth of cancer cells. This medicine is used to treat many types of cancer like breast cancer,  colon or rectal cancer, pancreatic cancer, and stomach cancer. This medicine may be used for other purposes; ask your health care provider or pharmacist if you have questions. COMMON BRAND NAME(S): Adrucil What should I tell my health care provider before I take this medicine? They need to know if you have any of these conditions: -blood disorders -dihydropyrimidine dehydrogenase (DPD) deficiency -infection (especially a virus infection such as chickenpox, cold sores, or herpes) -kidney  disease -liver disease -malnourished, poor nutrition -recent or ongoing radiation therapy -an unusual or allergic reaction to fluorouracil, other chemotherapy, other medicines, foods, dyes, or preservatives -pregnant or trying to get pregnant -breast-feeding How should I use this medicine? This drug is given as an infusion or injection into a vein. It is administered in a hospital or clinic by a specially trained health care professional. Talk to your pediatrician regarding the use of this medicine in children. Special care may be needed. Overdosage: If you think you have taken too much of this medicine contact a poison control center or emergency room at once. NOTE: This medicine is only for you. Do not share this medicine with others. What if I miss a dose? It is important not to miss your dose. Call your doctor or health care professional if you are unable to keep an appointment. What may interact with this medicine? -allopurinol -cimetidine -dapsone -digoxin -hydroxyurea -leucovorin -levamisole -medicines for seizures like ethotoin, fosphenytoin, phenytoin -medicines to increase blood counts like filgrastim, pegfilgrastim, sargramostim -medicines that treat or prevent blood clots like warfarin, enoxaparin, and dalteparin -methotrexate -metronidazole -pyrimethamine -some other chemotherapy drugs like busulfan, cisplatin, estramustine, vinblastine -trimethoprim -trimetrexate -vaccines Talk to your doctor or health care professional before taking any of these medicines: -acetaminophen -aspirin -ibuprofen -ketoprofen -naproxen This list may not describe all possible interactions. Give your health care provider a list of all the medicines, herbs, non-prescription drugs, or dietary supplements you use. Also tell them if you smoke, drink alcohol, or use illegal drugs. Some items may interact with your medicine. What should I watch for while using this medicine? Visit your doctor  for checks on your progress. This drug may make you feel generally unwell. This is not uncommon, as chemotherapy can affect healthy cells as well as cancer cells. Report any side effects. Continue your course of treatment even though you feel ill unless your doctor tells you to stop. In some cases, you may be given additional medicines to help with side effects. Follow all directions for their use. Call your doctor or health care professional for advice if you get a fever, chills or sore throat, or other symptoms of a cold or flu. Do not treat yourself. This drug decreases your body's ability to fight infections. Try to avoid being around people who are sick. This medicine may increase your risk to bruise or bleed. Call your doctor or health care professional if you notice any unusual bleeding. Be careful brushing and flossing your teeth or using a toothpick because you may get an infection or bleed more easily. If you have any dental work done, tell your dentist you are receiving this medicine. Avoid taking products that contain aspirin, acetaminophen, ibuprofen, naproxen, or ketoprofen unless instructed by your doctor. These medicines may hide a fever. Do not become pregnant while taking this medicine. Women should inform their doctor if they wish to become pregnant or think they might be pregnant. There is a potential for serious side effects to an unborn child. Talk to your health care professional or  pharmacist for more information. Do not breast-feed an infant while taking this medicine. Men should inform their doctor if they wish to father a child. This medicine may lower sperm counts. Do not treat diarrhea with over the counter products. Contact your doctor if you have diarrhea that lasts more than 2 days or if it is severe and watery. This medicine can make you more sensitive to the sun. Keep out of the sun. If you cannot avoid being in the sun, wear protective clothing and use sunscreen. Do not  use sun lamps or tanning beds/booths. What side effects may I notice from receiving this medicine? Side effects that you should report to your doctor or health care professional as soon as possible: -allergic reactions like skin rash, itching or hives, swelling of the face, lips, or tongue -low blood counts - this medicine may decrease the number of white blood cells, red blood cells and platelets. You may be at increased risk for infections and bleeding. -signs of infection - fever or chills, cough, sore throat, pain or difficulty passing urine -signs of decreased platelets or bleeding - bruising, pinpoint red spots on the skin, black, tarry stools, blood in the urine -signs of decreased red blood cells - unusually weak or tired, fainting spells, lightheadedness -breathing problems -changes in vision -chest pain -mouth sores -nausea and vomiting -pain, swelling, redness at site where injected -pain, tingling, numbness in the hands or feet -redness, swelling, or sores on hands or feet -stomach pain -unusual bleeding Side effects that usually do not require medical attention (report to your doctor or health care professional if they continue or are bothersome): -changes in finger or toe nails -diarrhea -dry or itchy skin -hair loss -headache -loss of appetite -sensitivity of eyes to the light -stomach upset -unusually teary eyes This list may not describe all possible side effects. Call your doctor for medical advice about side effects. You may report side effects to FDA at 1-800-FDA-1088. Where should I keep my medicine? This drug is given in a hospital or clinic and will not be stored at home. NOTE: This sheet is a summary. It may not cover all possible information. If you have questions about this medicine, talk to your doctor, pharmacist, or health care provider.  2015, [employer]. (2008-04-03 13:53:16)

## 2014-11-19 NOTE — Progress Notes (Signed)
ID: [name] OB: 1971-04-22  MR#: [id number]  CSN#:[id number]  PCP: [name], MD GYN:   SU: Dr. [name] OTHER MD:  CHIEF COMPLAINT:  Newly metastatic breast cancer CURRENT TREATMENT: CMF,  goserelin   BREAST CANCER HISTORY: From Dr. [name] original intake note:  "Patient developed a left sided breast mass show up about 6 months prior to diagnosis. 2 months prior this began getting larger rapidly. She was seen in an urgent care where this mass was thought to be an abscess. Apparently there was an attempt to excise or drain this that was not successful and she was referred to Dr. [name] for evaluation. She underwent punch biopsy on 04/23/13 of her skin and the mass. She was then also sent for mm/us. The biopsy shows invasive high grade carcinoma that appears to be breast. It is ER 14%, PR negative, HER-2/neu negative with a Ki-67 that approaches 100%. She has also undergone mm with at least 10 cm upper inner left breast tumor with mild overyling skin thickening. There is also a 3x10 mm cluster of calcs near there also. There is indeterminate 7 mm level 1 left axillary node, she underwent a lymph node biopsy that was positive for disease. No mm evidence of right breast malignancy. An MRI of the breasts was done on 05/22/13 and showed a 14 cm mass in the left breast protruding from the skin and also a 2.5 cm level II axillary node."  Her subsequent history is as detailed below  INTERVAL HISTORY:   Wilmina returns today for follow up of her breast cancer, accompanied by her significant other, [name]. She is to begin "CMF" today: cyclophosphamide, methotrexate, and fluorouracil given every 21 days   REVIEW OF SYSTEMS:  Zarayah denies fevers, chills, or changes in bowel or bladder habits. She is using an appetite stimulant that has also kept her nausea at bay, however she has lost about 5lb in the last 2 weeks and 20lbs since September. The numbness to her fingers  remains at baseline. Her pain is controlled with 100 mcg worth of fentanyl patches and oxycodone PRN. She has no shortness of breath, chest pain, cough, palpitations, or fatigue. A detailed review of systems is otherwise negative.   PAST MEDICAL HISTORY: Past Medical History  Diagnosis Date  . Breast cancer     Invasive High Grade Carcinoma  . Headache(784.0)     Hx: of Migraines  . Status post chemotherapy     Adriamycin and Cytoxan  . Anxiety     ANXIOUS ABOUT HAVEING BIG SURGERY - MASTECTOMY  . 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 DVT (deep venous thrombosis)     RT ARM-PT ON LOVENOX  . Hx of iron deficiency anemia 11/26/13  . S/P radiation therapy 02-04-14 to 03-18-14                               1) Left Chest Wall / 50 Gy in 25 fractions/ 2) Left Supraclavicular fossa/ 46 Gy in 23 fractions / 3) Left Posterior Axillary boost / 8.533 Gy in 23 fractions/ 4) Left Chest Wall Scar boost / 10 Gy in 5 fractions    PAST SURGICAL HISTORY: Past Surgical History  Procedure Laterality Date  . Tubal ligation    . Portacath placement Right 05/22/2013    Procedure: INSERTION PORT-A-CATH;  Surgeon: [name], MD;  Location: [facility];  Service: General;  Laterality: Right;  .  Left breast needle core biopsy Left 05/21/13  . Left breast needle core  biopsy Left 06/22/13  . Mastectomy modified radical Left 11/15/2013    Procedure: LEFT MASTECTOMY MODIFIED RADICAL;  Surgeon: [name], MD;  Location: [facility];  Service: General;  Laterality: Left;    FAMILY HISTORY Family History  Problem Relation Age of Onset  . Hypertension Mother   . Diabetes Father 28  . Stomach cancer Maternal Aunt   . Hypertension Maternal Grandmother   . Hypertension Maternal Grandfather   . Diabetes Paternal Grandmother   . Diabetes Paternal Grandfather   . Diabetes Paternal Uncle   . Diabetes Paternal Uncle   . Diabetes Paternal Uncle   The patient's mother is alive, age 60. The patient's father died  from complications of diabetes at the age of 18. The patient has 2 brothers, one sister. There is no history of breast or ovarian cancer in the family to her knowledge .the patient has not undergone genetic testing   GYNECOLOGIC HISTORY:  Menarche age 60, first live birth age 70. The patient is GX P3. The patient has been on goserelin since 06/13/2013, given monthly  SOCIAL HISTORY:  The patient has worked at as a Furniture conservator/restorer and a Retail buyer. She is currently applying for disability. At home she lives with her youngest son, [name], 60; with the patient's brother Cheralyn Oliver; and with the patient's fiance [name].   ADVANCED DIRECTIVES: Not in place  HEALTH MAINTENANCE: History  Substance Use Topics  . Smoking status: Never Smoker   . Smokeless tobacco: Never Used  . Alcohol Use: No     Mammogram: 06/22/2013 Colonoscopy: Bone Density Scan:  Pap Smear:  Eye Exam:  Vitamin D Level:   Lipid Panel:    Allergies  Allergen Reactions  . Doxycycline Nausea And Vomiting    Current Outpatient Prescriptions  Medication Sig Dispense Refill  . cyclobenzaprine (FLEXERIL) 5 MG tablet TAKE 1 TABLET BY MOUTH THREE TIMES A DAY FOR MUSCLE SPASMS 30 tablet 1  . fentaNYL (DURAGESIC - DOSED MCG/HR) 25 MCG/HR patch Place 1 patch (25 mcg total) onto the skin every 3 (three) days. 10 patch 0  . fentaNYL (DURAGESIC - DOSED MCG/HR) 75 MCG/HR Place 1 patch (75 mcg total) onto the skin every 3 (three) days. 10 patch 0  . gabapentin (NEURONTIN) 300 MG capsule Take 1 capsule (300 mg total) by mouth 3 (three) times daily. 90 capsule 4  . ibuprofen (ADVIL,MOTRIN) 200 MG tablet Take 600-800 mg by mouth every 4 (four) hours as needed for mild pain or moderate pain.     Marland Kitchen loratadine (CLARITIN) 10 MG tablet Take 10 mg by mouth daily as needed for allergies.    Marland Kitchen LORazepam (ATIVAN) 0.5 MG tablet Take 0.5 mg by mouth every 6 (six) hours as needed for anxiety.    Marland Kitchen oxyCODONE (OXY IR/ROXICODONE) 5 MG  immediate release tablet Take 1 tablet (5 mg total) by mouth every 8 (eight) hours as needed for severe pain or breakthrough pain. 90 tablet 0  . potassium chloride SA (K-DUR,KLOR-CON) 20 MEQ tablet Take 1 tablet (20 mEq total) by mouth 4 (four) times daily. 120 tablet 1  . PRESCRIPTION MEDICATION PT REPORTS RECEIVING A SHOT ONCE A MONTH AT CANCER CENTER TO STOP HER MENSTRUAL PERIODS - I SEE DOCUMENTATION IN OFFICE NOTES FROM CANCER CENTER THAT PT WAS GETTING ZOLADEX SQ EVERY 28 DAYS.    Marland Kitchen traMADol (ULTRAM) 50 MG tablet Take 1-2 tablets (50-100 mg total) by  mouth every 6 (six) hours as needed. 30 tablet 1  . UNABLE TO FIND Rx: L8000- Post Surgical Bra (Quantity: 6) K1601- Non Silicone Breast prosthesis (Quantity: 1) Dx: 174.9; Left mastectomy 1 each 0  . zolpidem (AMBIEN) 5 MG tablet Take 1 tablet (5 mg total) by mouth at bedtime as needed for sleep. 30 tablet 0  . HYDROcodone-homatropine (HYCODAN) 5-1.5 MG/5ML syrup Take 5 mLs by mouth every 6 (six) hours as needed for cough. (Patient not taking: Reported on 11/19/2014) 240 mL 0   No current facility-administered medications for this visit.   Facility-Administered Medications Ordered in Other Visits  Medication Dose Route Frequency Provider Last Rate Last Dose  . sodium chloride 0.9 % injection 10 mL  10 mL Intracatheter PRN [name], MD   10 mL at 11/19/14 1250  . topical emolient (BIAFINE) emulsion   Topical Daily [name], MD        OBJECTIVE: middle-aged African American woman in no acute distress Filed Vitals:   11/19/14 1019  BP: 114/72  Pulse: 84  Temp: 98.2 F (36.8 C)  Resp: 18     Body mass index is 30 kg/(m^2).      ECOG FS:1 - Symptomatic but completely ambulatory   Skin: warm, dry  HEENT: sclerae anicteric, conjunctivae pink, oropharynx clear. No thrush or mucositis.  Lymph Nodes: No cervical or supraclavicular lymphadenopathy  Lungs: clear to auscultation bilaterally, no rales, wheezes, or rhonci  Heart:  regular rate and rhythm  Abdomen: round, soft, non tender, positive bowel sounds  Musculoskeletal: No focal spinal tenderness, no peripheral edema  Neuro: non focal, well oriented, positive affect  Breasts: deferred   LAB RESULTS:  CMP     Component Value Date/Time   NA 139 11/19/2014 1007   NA 138 11/16/2013 0448   K 3.9 11/19/2014 1007   K 3.5 11/16/2013 0448   CL 103 11/16/2013 0448   CL 102 06/01/2013 1500   CO2 27 11/19/2014 1007   CO2 27 11/16/2013 0448   GLUCOSE 89 11/19/2014 1007   GLUCOSE 116* 11/16/2013 0448   GLUCOSE 95 06/01/2013 1500   BUN 8.8 11/19/2014 1007   BUN 8 11/16/2013 0448   CREATININE 0.8 11/19/2014 1007   CREATININE 0.86 11/16/2013 0448   CALCIUM 10.2 11/19/2014 1007   CALCIUM 8.9 11/16/2013 0448   PROT 8.3 11/19/2014 1007   PROT 8.2 05/18/2013 1205   ALBUMIN 2.7* 11/19/2014 1007   ALBUMIN 2.8* 05/18/2013 1205   AST 17 11/19/2014 1007   AST 18 05/18/2013 1205   ALT 8 11/19/2014 1007   ALT 16 05/18/2013 1205   ALKPHOS 130 11/19/2014 1007   ALKPHOS 111 05/18/2013 1205   BILITOT 0.26 11/19/2014 1007   BILITOT 0.2* 05/18/2013 1205   GFRNONAA 82* 11/16/2013 0448   GFRAA >90 11/16/2013 0448    I No results found for: SPEP  Lab Results  Component Value Date   WBC 6.3 11/19/2014   NEUTROABS 4.4 11/19/2014   HGB 9.3* 11/19/2014   HCT 30.3* 11/19/2014   MCV 80.9 11/19/2014   PLT 645* 11/19/2014      Chemistry      Component Value Date/Time   NA 139 11/19/2014 1007   NA 138 11/16/2013 0448   K 3.9 11/19/2014 1007   K 3.5 11/16/2013 0448   CL 103 11/16/2013 0448   CL 102 06/01/2013 1500   CO2 27 11/19/2014 1007   CO2 27 11/16/2013 0448   BUN 8.8 11/19/2014 1007  BUN 8 11/16/2013 0448   CREATININE 0.8 11/19/2014 1007   CREATININE 0.86 11/16/2013 0448      Component Value Date/Time   CALCIUM 10.2 11/19/2014 1007   CALCIUM 8.9 11/16/2013 0448   ALKPHOS 130 11/19/2014 1007   ALKPHOS 111 05/18/2013 1205   AST 17 11/19/2014 1007    AST 18 05/18/2013 1205   ALT 8 11/19/2014 1007   ALT 16 05/18/2013 1205   BILITOT 0.26 11/19/2014 1007   BILITOT 0.2* 05/18/2013 1205       Lab Results  Component Value Date   LABCA2 35 05/18/2013    No components found for: KXFGH829  No results for input(s): INR in the last 168 hours.  Urinalysis No results found for: COLORURINE  STUDIES: Ct Chest W Contrast  11/04/2014   CLINICAL DATA:  Left breast cancer, chemotherapy and ongoing.  EXAM: CT CHEST, ABDOMEN, AND PELVIS WITH CONTRAST  TECHNIQUE: Multidetector CT imaging of the chest, abdomen and pelvis was performed following the standard protocol during bolus administration of intravenous contrast.  CONTRAST:  153m OMNIPAQUE IOHEXOL 300 MG/ML  SOLN  COMPARISON:  09/03/2014.  FINDINGS: CT CHEST FINDINGS  Right IJ Port-A-Cath terminates in the low SVC. No pathologically enlarged mediastinal, hilar or axillary lymph nodes. Surgical clips in the left axilla. Postoperative changes of left mastectomy. Heart is mildly enlarged. Left ventricle appears dilated. No pericardial effusion.  Pleural nodularity and a small amount of associated complex fluid have progressed in the right hemi thorax. There is mass effect on the right hepatic lobe. Index pleural nodule along the right mediastinal border measures 1.0 x 1.8 cm (image 21), previously 0.7 x 1.2 cm. Mild changes of radiation fibrosis in the anterior left upper lobe. No left pleural fluid. Airway is unremarkable.  CT ABDOMEN AND PELVIS FINDINGS  Hepatobiliary: Intermediate density lesions in the right hepatic lobe measure up to 2.0 x 3.3 cm (image 56), previously 1.9 x 2.7 cm). Liver measures 23.9 cm. Multiple stones are seen in the gallbladder. No biliary ductal dilatation.  Pancreas: Negative.  Spleen: Negative.  Adrenals/Urinary Tract: Adrenal glands and kidneys are unremarkable. Ureters are decompressed. Bladder is low in volume.  Stomach/Bowel: Stomach, small bowel, appendix and colon are  unremarkable.  Vascular/Lymphatic: Left renal vein appears circumaortic. Vascular structures are otherwise unremarkable. No pathologically enlarged lymph nodes.  Reproductive: Uterus and ovaries are visualized.  Other: No free fluid.  Mesenteries and peritoneum are unremarkable.  Musculoskeletal: No worrisome lytic or sclerotic lesions.  IMPRESSION: 1. Progressive right pleural and hepatic metastatic disease. 2. Hepatomegaly. 3. Chololithiasis.   Electronically Signed   By: MLorin PicketM.D.   On: 11/04/2014 12:25   Ct Abdomen Pelvis W Contrast  11/04/2014   CLINICAL DATA:  Left breast cancer, chemotherapy and ongoing.  EXAM: CT CHEST, ABDOMEN, AND PELVIS WITH CONTRAST  TECHNIQUE: Multidetector CT imaging of the chest, abdomen and pelvis was performed following the standard protocol during bolus administration of intravenous contrast.  CONTRAST:  1018mOMNIPAQUE IOHEXOL 300 MG/ML  SOLN  COMPARISON:  09/03/2014.  FINDINGS: CT CHEST FINDINGS  Right IJ Port-A-Cath terminates in the low SVC. No pathologically enlarged mediastinal, hilar or axillary lymph nodes. Surgical clips in the left axilla. Postoperative changes of left mastectomy. Heart is mildly enlarged. Left ventricle appears dilated. No pericardial effusion.  Pleural nodularity and a small amount of associated complex fluid have progressed in the right hemi thorax. There is mass effect on the right hepatic lobe. Index pleural nodule along the right mediastinal  border measures 1.0 x 1.8 cm (image 21), previously 0.7 x 1.2 cm. Mild changes of radiation fibrosis in the anterior left upper lobe. No left pleural fluid. Airway is unremarkable.  CT ABDOMEN AND PELVIS FINDINGS  Hepatobiliary: Intermediate density lesions in the right hepatic lobe measure up to 2.0 x 3.3 cm (image 56), previously 1.9 x 2.7 cm). Liver measures 23.9 cm. Multiple stones are seen in the gallbladder. No biliary ductal dilatation.  Pancreas: Negative.  Spleen: Negative.   Adrenals/Urinary Tract: Adrenal glands and kidneys are unremarkable. Ureters are decompressed. Bladder is low in volume.  Stomach/Bowel: Stomach, small bowel, appendix and colon are unremarkable.  Vascular/Lymphatic: Left renal vein appears circumaortic. Vascular structures are otherwise unremarkable. No pathologically enlarged lymph nodes.  Reproductive: Uterus and ovaries are visualized.  Other: No free fluid.  Mesenteries and peritoneum are unremarkable.  Musculoskeletal: No worrisome lytic or sclerotic lesions.  IMPRESSION: 1. Progressive right pleural and hepatic metastatic disease. 2. Hepatomegaly. 3. Chololithiasis.   Electronically Signed   By: Lorin Picket M.D.   On: 11/04/2014 12:25    ASSESSMENT: 44 y.o.  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  (0) genetic testing of the ATM, BARD1, BRCA1, BRCA2, BRIP1, CDH1, CHEK2, EPCAM, FANCC, MLH1, MSH2, MSH6, NBN, PALB2, PMS2, PTEN, RAD51C, RAD51D, STK11, TP53, and XRCC2 genes showed no deleterious mutations  (1) status post left breast biopsy 04/23/2013 for a clinical T4 N1, stage IIIB invasive ductal carcinoma, grade 3,  estrogen receptor 14% positive with moderate staining intensity, progesterone receptor negative,  with an MIB-1 of  100% and HER-2/neu  nonamplified  (2) biopsy of a left axillary lymph node [phone] was positive  (3) started monthly goserelin July of 02/01/2013  (4)  received  neoadjuvant chemotherapy with doxorubicin and cyclophosphamide in dose dense fashion x4, followed by carboplatin and paclitaxel x1, discontinued due to neuropathy, followed by carboplatin and gemcitabine, both given day 1 of each 14 day cycle x6, completed 10/12/2013 .   (5) right upper extremity DVT documented 06/29/2013,  treated with Lovenox from  July  through  December 2014    (6) status post left modified radical mastectomy 11/15/2013 for a residual pT3 pN0 invasive ductal carcinoma, grade 3, with repeat  prognostic panel triple negative. All 12 axillary lymph nodes were clear. Margins were negative   (5) completed adjuvant radiation therapy 03/18/2014,  with capecitabine added 2-3 weeks into treatment.  METASTATIC DISEASE  (6) CT scans of the chest, abdomen, and pelvis 06/26/2014 showed a large right pleural effusion  with pleural studding, 2 left upper lobe nodules, and two  liver lesions. Cytology from right thoracentesis 07/03/2014 showed malignant cells consistent with the patient's known primary  (7) started eribulin 07/09/2014, given days 1 and 8 of each 21 day cycle; restagind studies after cycle 3 showed evidence of response. Discontinued after cycle 5 (last dose 10/08/14) because of worsening peripheral neuropathy--also with progression.   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 The labs were reviewed in detail and were entirely stable. She will proceed with day 1, cycle 1 of CMF today. We reviewed her anti-emetic schedule and she understands how to take these medications.   Jenilee will return next week to discuss her tolerance with this cycle. I have put the rest of the appointments  to get her through 4 cycles of this chemo regimen. She will be restaged after this. She understands and agrees with this plan. She knows the goal of treatment is control. She has been encouraged to call with any issues that might arise before her next visit here.   Marcelino Duster, [place] [phone] 11/19/2014 3:16 PM

## 2014-11-19 NOTE — Telephone Encounter (Signed)
Per staff message and POF I have scheduled appts. Advised scheduler of appts. JMW  

## 2014-11-20 ENCOUNTER — Telehealth: Payer: Self-pay | Admitting: *Deleted

## 2014-11-20 NOTE — Telephone Encounter (Signed)
-----   Message from Arty Baumgartner, RN sent at 11/19/2014  1:01 PM EST ----- Regarding: First time chemo First time CMF.  Dr Jana Hakim.  [phone]

## 2014-11-20 NOTE — Telephone Encounter (Signed)
Called [name] for chemotherapy F/U.  Patient is doing well.  Has taken anti-emetic this morning due to nausea.  Has eaten two oranges and drinking lots of water.  Denies vomiting, getting hungry and will try to eat something at this time.  Denies any new side effects or symptoms.  Bowel and bladder is functioning well.  Eating and drinking well and I instructed to drink 64 oz minimum daily or at least the day before, of and after treatment.  Denies questions at this time and encouraged to call if needed.  Reviewed how to call after hours in the case of an emergency.

## 2014-11-26 ENCOUNTER — Ambulatory Visit (HOSPITAL_BASED_OUTPATIENT_CLINIC_OR_DEPARTMENT_OTHER): Payer: PRIVATE HEALTH INSURANCE | Admitting: Nurse Practitioner

## 2014-11-26 ENCOUNTER — Other Ambulatory Visit (HOSPITAL_BASED_OUTPATIENT_CLINIC_OR_DEPARTMENT_OTHER): Payer: PRIVATE HEALTH INSURANCE

## 2014-11-26 ENCOUNTER — Encounter: Payer: Self-pay | Admitting: Nurse Practitioner

## 2014-11-26 VITALS — BP 108/79 | HR 97 | Temp 98.1°F | Resp 20 | Ht 65.0 in | Wt 176.9 lb

## 2014-11-26 DIAGNOSIS — C773 Secondary and unspecified malignant neoplasm of axilla and upper limb lymph nodes: Secondary | ICD-10-CM

## 2014-11-26 DIAGNOSIS — J910 Malignant pleural effusion: Secondary | ICD-10-CM

## 2014-11-26 DIAGNOSIS — D649 Anemia, unspecified: Secondary | ICD-10-CM

## 2014-11-26 DIAGNOSIS — C50912 Malignant neoplasm of unspecified site of left female breast: Secondary | ICD-10-CM

## 2014-11-26 DIAGNOSIS — Z86718 Personal history of other venous thrombosis and embolism: Secondary | ICD-10-CM

## 2014-11-26 DIAGNOSIS — C50212 Malignant neoplasm of upper-inner quadrant of left female breast: Secondary | ICD-10-CM

## 2014-11-26 DIAGNOSIS — L271 Localized skin eruption due to drugs and medicaments taken internally: Secondary | ICD-10-CM | POA: Insufficient documentation

## 2014-11-26 DIAGNOSIS — G620 Drug-induced polyneuropathy: Secondary | ICD-10-CM

## 2014-11-26 DIAGNOSIS — Z171 Estrogen receptor negative status [ER-]: Secondary | ICD-10-CM

## 2014-11-26 LAB — CBC WITH DIFFERENTIAL/PLATELET
BASO%: 0.3 % (ref 0.0–2.0)
BASOS ABS: 0 10*3/uL (ref 0.0–0.1)
EOS%: 0.9 % (ref 0.0–7.0)
Eosinophils Absolute: 0 10*3/uL (ref 0.0–0.5)
HEMATOCRIT: 27.6 % — AB (ref 34.8–46.6)
HEMOGLOBIN: 8.6 g/dL — AB (ref 11.6–15.9)
LYMPH#: 0.6 10*3/uL — AB (ref 0.9–3.3)
LYMPH%: 14 % (ref 14.0–49.7)
MCH: 24.9 pg — ABNORMAL LOW (ref 25.1–34.0)
MCHC: 31 g/dL — AB (ref 31.5–36.0)
MCV: 80.3 fL (ref 79.5–101.0)
MONO#: 0 10*3/uL — ABNORMAL LOW (ref 0.1–0.9)
MONO%: 0.9 % (ref 0.0–14.0)
NEUT%: 83.9 % — ABNORMAL HIGH (ref 38.4–76.8)
NEUTROS ABS: 3.6 10*3/uL (ref 1.5–6.5)
Platelets: 354 10*3/uL (ref 145–400)
RBC: 3.43 10*6/uL — ABNORMAL LOW (ref 3.70–5.45)
RDW: 20.2 % — ABNORMAL HIGH (ref 11.2–14.5)
WBC: 4.2 10*3/uL (ref 3.9–10.3)

## 2014-11-26 LAB — COMPREHENSIVE METABOLIC PANEL (CC13)
ALT: 13 U/L (ref 0–55)
ANION GAP: 10 meq/L (ref 3–11)
AST: 16 U/L (ref 5–34)
Albumin: 2.6 g/dL — ABNORMAL LOW (ref 3.5–5.0)
Alkaline Phosphatase: 124 U/L (ref 40–150)
BUN: 8.5 mg/dL (ref 7.0–26.0)
CALCIUM: 9.6 mg/dL (ref 8.4–10.4)
CHLORIDE: 98 meq/L (ref 98–109)
CO2: 29 meq/L (ref 22–29)
Creatinine: 0.7 mg/dL (ref 0.6–1.1)
Glucose: 89 mg/dl (ref 70–140)
Potassium: 3.3 mEq/L — ABNORMAL LOW (ref 3.5–5.1)
Sodium: 138 mEq/L (ref 136–145)
Total Bilirubin: 0.6 mg/dL (ref 0.20–1.20)
Total Protein: 7.9 g/dL (ref 6.4–8.3)

## 2014-11-26 NOTE — Progress Notes (Signed)
ID: Erika [name] OB: [birth year]  MR#: [id number]  CSN#:[id number]  PCP: [name], MD GYN:   SU: Dr. [name] OTHER MD:  CHIEF COMPLAINT:  Newly metastatic breast cancer CURRENT TREATMENT: CMF,  goserelin   BREAST CANCER HISTORY: From Dr. [name] original intake note:  "Patient developed a left sided breast mass show up about 6 months prior to diagnosis. 2 months prior this began getting larger rapidly. She was seen in an urgent care where this mass was thought to be an abscess. Apparently there was an attempt to excise or drain this that was not successful and she was referred to Dr. [name] for evaluation. She underwent punch biopsy on 04/23/13 of her skin and the mass. She was then also sent for mm/us. The biopsy shows invasive high grade carcinoma that appears to be breast. It is ER 14%, PR negative, HER-2/neu negative with a Ki-67 that approaches 100%. She has also undergone mm with at least 10 cm upper inner left breast tumor with mild overyling skin thickening. There is also a 3x10 mm cluster of calcs near there also. There is indeterminate 7 mm level 1 left axillary node, she underwent a lymph node biopsy that was positive for disease. No mm evidence of right breast malignancy. An MRI of the breasts was done on 05/22/13 and showed a 14 cm mass in the left breast protruding from the skin and also a 2.5 cm level II axillary node."  Her subsequent history is as detailed below  INTERVAL HISTORY:   [name] returns today for follow up of her breast cancer, accompanied by her significant other, Erika [name], as well as her youngest son. Today is day 8, cycle 1 of cyclophosphamide, methotrexate, and fluorouracil given every 21 days   REVIEW OF SYSTEMS:  Naveya denies fevers, chills, nausea, vomiting, or changes in bowel or bladder habits. Her appetite is not at "100%," but has improved over the last 2 weeks. She denies mouth sores, or rashes, but does have peeling to her hands  and feet. This is not painful or erythematous. The numbness to her fingertips is unchanged. Her pain is managed well with 100 mcg worth of fentanyl patches and oxycodone PRN. She is a bit fatigued this week, however she denies shortness of breath, chest pain, cough, or palpitations. A detailed review of systems is otherwise negative.   PAST MEDICAL HISTORY: Past Medical History  Diagnosis Date  . Breast cancer     Invasive High Grade Carcinoma  . Headache(784.0)     Hx: of Migraines  . Status post chemotherapy     Adriamycin and Cytoxan  . Anxiety     ANXIOUS ABOUT HAVEING BIG SURGERY - MASTECTOMY  . 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 DVT (deep venous thrombosis)     RT ARM-PT ON LOVENOX  . Hx of iron deficiency anemia 11/26/13  . S/P radiation therapy 02-04-14 to 03-18-14                               1) Left Chest Wall / 50 Gy in 25 fractions/ 2) Left Supraclavicular fossa/ 46 Gy in 23 fractions / 3) Left Posterior Axillary boost / 8.533 Gy in 23 fractions/ 4) Left Chest Wall Scar boost / 10 Gy in 5 fractions    PAST SURGICAL HISTORY: Past Surgical History  Procedure Laterality Date  . Tubal ligation    . Portacath placement Right 05/22/2013  Procedure: INSERTION PORT-A-CATH;  Surgeon: [name], MD;  Location: [facility];  Service: General;  Laterality: Right;  . Left breast needle core biopsy Left 05/21/13  . Left breast needle core  biopsy Left 06/22/13  . Mastectomy modified radical Left 11/15/2013    Procedure: LEFT MASTECTOMY MODIFIED RADICAL;  Surgeon: [name], MD;  Location: [facility];  Service: General;  Laterality: Left;    FAMILY HISTORY Family History  Problem Relation Age of Onset  . Hypertension Mother   . Diabetes Father 13  . Stomach cancer Maternal Aunt   . Hypertension Maternal Grandmother   . Hypertension Maternal Grandfather   . Diabetes Paternal Grandmother   . Diabetes Paternal Grandfather   . Diabetes Paternal Uncle   . Diabetes  Paternal Uncle   . Diabetes Paternal Uncle   The patient's mother is alive, age 44. The patient's father died from complications of diabetes at the age of 51. The patient has 2 brothers, one sister. There is no history of breast or ovarian cancer in the family to her knowledge .the patient has not undergone genetic testing   GYNECOLOGIC HISTORY:  Menarche age 43, first live birth age 43. The patient is GX P3. The patient has been on goserelin since 06/13/2013, given monthly  SOCIAL HISTORY:  The patient has worked at as a Furniture conservator/restorer and a Retail buyer. She is currently applying for disability. At home she lives with her youngest son, Erika [name], 44; with the patient's brother Azadeh Hyder; and with [name].   ADVANCED DIRECTIVES: Not in place  HEALTH MAINTENANCE: History  Substance Use Topics  . Smoking status: Never Smoker   . Smokeless tobacco: Never Used  . Alcohol Use: No     Mammogram: 06/22/2013 Colonoscopy: Bone Density Scan:  Pap Smear:  Eye Exam:  Vitamin D Level:   Lipid Panel:    Allergies  Allergen Reactions  . Doxycycline Nausea And Vomiting    Current Outpatient Prescriptions  Medication Sig Dispense Refill  . cyclobenzaprine (FLEXERIL) 5 MG tablet TAKE 1 TABLET BY MOUTH THREE TIMES A DAY FOR MUSCLE SPASMS 30 tablet 1  . fentaNYL (DURAGESIC - DOSED MCG/HR) 25 MCG/HR patch Place 1 patch (25 mcg total) onto the skin every 3 (three) days. 10 patch 0  . fentaNYL (DURAGESIC - DOSED MCG/HR) 75 MCG/HR Place 1 patch (75 mcg total) onto the skin every 3 (three) days. 10 patch 0  . gabapentin (NEURONTIN) 300 MG capsule Take 1 capsule (300 mg total) by mouth 3 (three) times daily. 90 capsule 4  . loratadine (CLARITIN) 10 MG tablet Take 10 mg by mouth daily as needed for allergies.    Marland Kitchen LORazepam (ATIVAN) 0.5 MG tablet Take 0.5 mg by mouth every 6 (six) hours as needed for anxiety.    Marland Kitchen oxyCODONE (OXY IR/ROXICODONE) 5 MG immediate release tablet Take  1 tablet (5 mg total) by mouth every 8 (eight) hours as needed for severe pain or breakthrough pain. 90 tablet 0  . PRESCRIPTION MEDICATION PT REPORTS RECEIVING A SHOT ONCE A MONTH AT CANCER CENTER TO STOP HER MENSTRUAL PERIODS - I SEE DOCUMENTATION IN OFFICE NOTES FROM CANCER CENTER THAT PT WAS GETTING ZOLADEX SQ EVERY 28 DAYS.    Marland Kitchen traMADol (ULTRAM) 50 MG tablet Take 1-2 tablets (50-100 mg total) by mouth every 6 (six) hours as needed. 30 tablet 1  . UNABLE TO FIND Rx: L8000- Post Surgical Bra (Quantity: 6) D9833- Non Silicone Breast prosthesis (Quantity: 1) Dx: 174.9;  Left mastectomy 1 each 0  . zolpidem (AMBIEN) 5 MG tablet Take 1 tablet (5 mg total) by mouth at bedtime as needed for sleep. 30 tablet 0  . HYDROcodone-homatropine (HYCODAN) 5-1.5 MG/5ML syrup Take 5 mLs by mouth every 6 (six) hours as needed for cough. (Patient not taking: Reported on 11/19/2014) 240 mL 0  . ibuprofen (ADVIL,MOTRIN) 200 MG tablet Take 600-800 mg by mouth every 4 (four) hours as needed for mild pain or moderate pain.     . potassium chloride SA (K-DUR,KLOR-CON) 20 MEQ tablet Take 1 tablet (20 mEq total) by mouth 4 (four) times daily. (Patient not taking: Reported on 11/26/2014) 120 tablet 1   No current facility-administered medications for this visit.   Facility-Administered Medications Ordered in Other Visits  Medication Dose Route Frequency Provider Last Rate Last Dose  . topical emolient (BIAFINE) emulsion   Topical Daily Wyvonnia Lora, MD        OBJECTIVE: middle-aged African American woman in no acute distress Filed Vitals:   11/26/14 0916  BP: 108/79  Pulse: 97  Temp: 98.1 F (36.7 C)  Resp: 20     Body mass index is 29.44 kg/(m^2).      ECOG FS:1 - Symptomatic but completely ambulatory   Skin: warm, dry, scattered spots of peeling noted to her bilateral fingertips, no erythema or pain, hyperpigmented bases of nail HEENT: sclerae anicteric, conjunctivae pink, oropharynx clear. No thrush or  mucositis.  Lymph Nodes: No cervical or supraclavicular lymphadenopathy  Lungs: clear to auscultation bilaterally, no rales, wheezes, or rhonci  Heart: regular rate and rhythm  Abdomen: round, soft, non tender, positive bowel sounds  Musculoskeletal: No focal spinal tenderness, no peripheral edema  Neuro: non focal, well oriented, positive affect  Breasts: deferred   LAB RESULTS:  CMP     Component Value Date/Time   NA 139 11/19/2014 1007   NA 138 11/16/2013 0448   K 3.9 11/19/2014 1007   K 3.5 11/16/2013 0448   CL 103 11/16/2013 0448   CL 102 06/01/2013 1500   CO2 27 11/19/2014 1007   CO2 27 11/16/2013 0448   GLUCOSE 89 11/19/2014 1007   GLUCOSE 116* 11/16/2013 0448   GLUCOSE 95 06/01/2013 1500   BUN 8.8 11/19/2014 1007   BUN 8 11/16/2013 0448   CREATININE 0.8 11/19/2014 1007   CREATININE 0.86 11/16/2013 0448   CALCIUM 10.2 11/19/2014 1007   CALCIUM 8.9 11/16/2013 0448   PROT 8.3 11/19/2014 1007   PROT 8.2 05/18/2013 1205   ALBUMIN 2.7* 11/19/2014 1007   ALBUMIN 2.8* 05/18/2013 1205   AST 17 11/19/2014 1007   AST 18 05/18/2013 1205   ALT 8 11/19/2014 1007   ALT 16 05/18/2013 1205   ALKPHOS 130 11/19/2014 1007   ALKPHOS 111 05/18/2013 1205   BILITOT 0.26 11/19/2014 1007   BILITOT 0.2* 05/18/2013 1205   GFRNONAA 82* 11/16/2013 0448   GFRAA >90 11/16/2013 0448    I No results found for: SPEP  Lab Results  Component Value Date   WBC 4.2 11/26/2014   NEUTROABS 3.6 11/26/2014   HGB 8.6* 11/26/2014   HCT 27.6* 11/26/2014   MCV 80.3 11/26/2014   PLT 354 11/26/2014      Chemistry      Component Value Date/Time   NA 139 11/19/2014 1007   NA 138 11/16/2013 0448   K 3.9 11/19/2014 1007   K 3.5 11/16/2013 0448   CL 103 11/16/2013 0448   CL 102 06/01/2013 1500  CO2 27 11/19/2014 1007   CO2 27 11/16/2013 0448   BUN 8.8 11/19/2014 1007   BUN 8 11/16/2013 0448   CREATININE 0.8 11/19/2014 1007   CREATININE 0.86 11/16/2013 0448      Component Value  Date/Time   CALCIUM 10.2 11/19/2014 1007   CALCIUM 8.9 11/16/2013 0448   ALKPHOS 130 11/19/2014 1007   ALKPHOS 111 05/18/2013 1205   AST 17 11/19/2014 1007   AST 18 05/18/2013 1205   ALT 8 11/19/2014 1007   ALT 16 05/18/2013 1205   BILITOT 0.26 11/19/2014 1007   BILITOT 0.2* 05/18/2013 1205       Lab Results  Component Value Date   LABCA2 35 05/18/2013    No components found for: [id number]  No results for input(s): INR in the last 168 hours.  Urinalysis No results found for: COLORURINE  STUDIES: Ct Chest W Contrast  11/04/2014   CLINICAL DATA:  Left breast cancer, chemotherapy and ongoing.  EXAM: CT CHEST, ABDOMEN, AND PELVIS WITH CONTRAST  TECHNIQUE: Multidetector CT imaging of the chest, abdomen and pelvis was performed following the standard protocol during bolus administration of intravenous contrast.  CONTRAST:  135m OMNIPAQUE IOHEXOL 300 MG/ML  SOLN  COMPARISON:  09/03/2014.  FINDINGS: CT CHEST FINDINGS  Right IJ Port-A-Cath terminates in the low SVC. No pathologically enlarged mediastinal, hilar or axillary lymph nodes. Surgical clips in the left axilla. Postoperative changes of left mastectomy. Heart is mildly enlarged. Left ventricle appears dilated. No pericardial effusion.  Pleural nodularity and a small amount of associated complex fluid have progressed in the right hemi thorax. There is mass effect on the right hepatic lobe. Index pleural nodule along the right mediastinal border measures 1.0 x 1.8 cm (image 21), previously 0.7 x 1.2 cm. Mild changes of radiation fibrosis in the anterior left upper lobe. No left pleural fluid. Airway is unremarkable.  CT ABDOMEN AND PELVIS FINDINGS  Hepatobiliary: Intermediate density lesions in the right hepatic lobe measure up to 2.0 x 3.3 cm (image 56), previously 1.9 x 2.7 cm). Liver measures 23.9 cm. Multiple stones are seen in the gallbladder. No biliary ductal dilatation.  Pancreas: Negative.  Spleen: Negative.  Adrenals/Urinary  Tract: Adrenal glands and kidneys are unremarkable. Ureters are decompressed. Bladder is low in volume.  Stomach/Bowel: Stomach, small bowel, appendix and colon are unremarkable.  Vascular/Lymphatic: Left renal vein appears circumaortic. Vascular structures are otherwise unremarkable. No pathologically enlarged lymph nodes.  Reproductive: Uterus and ovaries are visualized.  Other: No free fluid.  Mesenteries and peritoneum are unremarkable.  Musculoskeletal: No worrisome lytic or sclerotic lesions.  IMPRESSION: 1. Progressive right pleural and hepatic metastatic disease. 2. Hepatomegaly. 3. Chololithiasis.   Electronically Signed   By: MLorin PicketM.D.   On: 11/04/2014 12:25   Ct Abdomen Pelvis W Contrast  11/04/2014   CLINICAL DATA:  Left breast cancer, chemotherapy and ongoing.  EXAM: CT CHEST, ABDOMEN, AND PELVIS WITH CONTRAST  TECHNIQUE: Multidetector CT imaging of the chest, abdomen and pelvis was performed following the standard protocol during bolus administration of intravenous contrast.  CONTRAST:  1032mOMNIPAQUE IOHEXOL 300 MG/ML  SOLN  COMPARISON:  09/03/2014.  FINDINGS: CT CHEST FINDINGS  Right IJ Port-A-Cath terminates in the low SVC. No pathologically enlarged mediastinal, hilar or axillary lymph nodes. Surgical clips in the left axilla. Postoperative changes of left mastectomy. Heart is mildly enlarged. Left ventricle appears dilated. No pericardial effusion.  Pleural nodularity and a small amount of associated complex fluid have progressed in the right  hemi thorax. There is mass effect on the right hepatic lobe. Index pleural nodule along the right mediastinal border measures 1.0 x 1.8 cm (image 21), previously 0.7 x 1.2 cm. Mild changes of radiation fibrosis in the anterior left upper lobe. No left pleural fluid. Airway is unremarkable.  CT ABDOMEN AND PELVIS FINDINGS  Hepatobiliary: Intermediate density lesions in the right hepatic lobe measure up to 2.0 x 3.3 cm (image 56), previously 1.9  x 2.7 cm). Liver measures 23.9 cm. Multiple stones are seen in the gallbladder. No biliary ductal dilatation.  Pancreas: Negative.  Spleen: Negative.  Adrenals/Urinary Tract: Adrenal glands and kidneys are unremarkable. Ureters are decompressed. Bladder is low in volume.  Stomach/Bowel: Stomach, small bowel, appendix and colon are unremarkable.  Vascular/Lymphatic: Left renal vein appears circumaortic. Vascular structures are otherwise unremarkable. No pathologically enlarged lymph nodes.  Reproductive: Uterus and ovaries are visualized.  Other: No free fluid.  Mesenteries and peritoneum are unremarkable.  Musculoskeletal: No worrisome lytic or sclerotic lesions.  IMPRESSION: 1. Progressive right pleural and hepatic metastatic disease. 2. Hepatomegaly. 3. Chololithiasis.   Electronically Signed   By: Lorin Picket M.D.   On: 11/04/2014 12:25    ASSESSMENT: 44 y.o.  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  (0) genetic testing of the ATM, BARD1, BRCA1, BRCA2, BRIP1, CDH1, CHEK2, EPCAM, FANCC, MLH1, MSH2, MSH6, NBN, PALB2, PMS2, PTEN, RAD51C, RAD51D, STK11, TP53, and XRCC2 genes showed no deleterious mutations  (1) status post left breast biopsy 04/23/2013 for a clinical T4 N1, stage IIIB invasive ductal carcinoma, grade 3,  estrogen receptor 14% positive with moderate staining intensity, progesterone receptor negative,  with an MIB-1 of  100% and HER-2/neu  nonamplified  (2) biopsy of a left axillary lymph node [phone] was positive  (3) started monthly goserelin July of 02/01/2013  (4)  received  neoadjuvant chemotherapy with doxorubicin and cyclophosphamide in dose dense fashion x4, followed by carboplatin and paclitaxel x1, discontinued due to neuropathy, followed by carboplatin and gemcitabine, both given day 1 of each 14 day cycle x6, completed 10/12/2013 .   (5) right upper extremity DVT documented 06/29/2013,  treated with Lovenox from  July  through   December 2014    (6) status post left modified radical mastectomy 11/15/2013 for a residual pT3 pN0 invasive ductal carcinoma, grade 3, with repeat prognostic panel triple negative. All 12 axillary lymph nodes were clear. Margins were negative   (5) completed adjuvant radiation therapy 03/18/2014,  with capecitabine added 2-3 weeks into treatment.  METASTATIC DISEASE  (6) CT scans of the chest, abdomen, and pelvis 06/26/2014 showed a large right pleural effusion  with pleural studding, 2 left upper lobe nodules, and two  liver lesions. Cytology from right thoracentesis 07/03/2014 showed malignant cells consistent with the patient's known primary  (7) started eribulin 07/09/2014, given days 1 and 8 of each 21 day cycle; restagind studies after cycle 3 showed evidence of response. Discontinued after cycle 5 (last dose 10/08/14) because of worsening peripheral neuropathy--also with progression.   (8) referral to [facility] initiated 07/08/2014  (9) peripheral neuropathy secondary to chemotherapy--improved with discontinuation of eribulin  (10) to start cyclophosphamide, methotrexate, fluorouracil (CMF) 11/18/2014, to be repeated every 21 days  PLAN:  The labs were reviewed in detail and were relatively stable. Her hgb is down to 8.6, but besides fatigue, Caedyn is otherwise asymptomatic. We will continue to monitor this lab value.   Arletha has what looks to be the beginning of hand-foot  syndrome. She will apply thick moisturizer such as eucerin, aquaphor, or shea butter to her palms and soles.   Marija will return in 2 weeks for the start of cycle 2 of CMF. She understands and agrees with this plan. She knows the goal of treatment in her case is control. She has been encouraged to call with any issues that might arise before her next visit here.    Marcelino Duster, [place] [phone] 11/26/2014 9:37 AM

## 2014-11-27 ENCOUNTER — Encounter: Payer: Self-pay | Admitting: Oncology

## 2014-11-27 NOTE — Progress Notes (Signed)
I received a message from [employer] (billing) stating patient's [place] coverage termed. Talked with Ms. Shenberger on 11/27/15 regarding [employer] coverage. Ms. Biederman stated her [employer] coverage should be active. I called [employer] spoke with Maudie Mercury Ref# [id number], per Maudie Mercury patient's coverage is active. Maudie Mercury also stated Ms. Stansbery needs to call the customer services line to correct her birthday. Ms. Krukowski birthday is 2071/01/09, [employer] has her birthday listed as [birth year], which is incorrect. Kim stated once Ms. Gikas correct the birthday issue,  billing should not have any problems submitting claims. Registration verify coverage as of 11/27/14 Ms. Weatherholtz has Scientific laboratory technician (primary) and Medicaid [employer] (secondary). Georges Lynch (billing), the conversation I had with Ms. Schaller and [place].

## 2014-12-10 ENCOUNTER — Other Ambulatory Visit (HOSPITAL_BASED_OUTPATIENT_CLINIC_OR_DEPARTMENT_OTHER): Payer: No Typology Code available for payment source

## 2014-12-10 ENCOUNTER — Ambulatory Visit (HOSPITAL_BASED_OUTPATIENT_CLINIC_OR_DEPARTMENT_OTHER): Payer: No Typology Code available for payment source

## 2014-12-10 ENCOUNTER — Encounter: Payer: Self-pay | Admitting: Nurse Practitioner

## 2014-12-10 ENCOUNTER — Other Ambulatory Visit: Payer: Self-pay | Admitting: Oncology

## 2014-12-10 ENCOUNTER — Telehealth: Payer: Self-pay | Admitting: *Deleted

## 2014-12-10 ENCOUNTER — Telehealth: Payer: Self-pay | Admitting: Nurse Practitioner

## 2014-12-10 ENCOUNTER — Ambulatory Visit: Payer: No Typology Code available for payment source | Admitting: Nutrition

## 2014-12-10 ENCOUNTER — Ambulatory Visit (HOSPITAL_BASED_OUTPATIENT_CLINIC_OR_DEPARTMENT_OTHER): Payer: No Typology Code available for payment source | Admitting: Nurse Practitioner

## 2014-12-10 VITALS — BP 100/76 | HR 88 | Temp 98.1°F | Resp 18 | Ht 65.0 in | Wt 171.6 lb

## 2014-12-10 DIAGNOSIS — C50212 Malignant neoplasm of upper-inner quadrant of left female breast: Secondary | ICD-10-CM

## 2014-12-10 DIAGNOSIS — C773 Secondary and unspecified malignant neoplasm of axilla and upper limb lymph nodes: Secondary | ICD-10-CM

## 2014-12-10 DIAGNOSIS — J910 Malignant pleural effusion: Secondary | ICD-10-CM

## 2014-12-10 DIAGNOSIS — C50912 Malignant neoplasm of unspecified site of left female breast: Secondary | ICD-10-CM

## 2014-12-10 DIAGNOSIS — Z171 Estrogen receptor negative status [ER-]: Secondary | ICD-10-CM

## 2014-12-10 DIAGNOSIS — E876 Hypokalemia: Secondary | ICD-10-CM

## 2014-12-10 DIAGNOSIS — Z5111 Encounter for antineoplastic chemotherapy: Secondary | ICD-10-CM

## 2014-12-10 DIAGNOSIS — G620 Drug-induced polyneuropathy: Secondary | ICD-10-CM

## 2014-12-10 DIAGNOSIS — Z86718 Personal history of other venous thrombosis and embolism: Secondary | ICD-10-CM

## 2014-12-10 DIAGNOSIS — G893 Neoplasm related pain (acute) (chronic): Secondary | ICD-10-CM

## 2014-12-10 LAB — COMPREHENSIVE METABOLIC PANEL (CC13)
ALT: 8 U/L (ref 0–55)
ANION GAP: 8 meq/L (ref 3–11)
AST: 16 U/L (ref 5–34)
Albumin: 2.8 g/dL — ABNORMAL LOW (ref 3.5–5.0)
Alkaline Phosphatase: 156 U/L — ABNORMAL HIGH (ref 40–150)
BILIRUBIN TOTAL: 0.26 mg/dL (ref 0.20–1.20)
BUN: 6 mg/dL — ABNORMAL LOW (ref 7.0–26.0)
CO2: 27 meq/L (ref 22–29)
CREATININE: 0.8 mg/dL (ref 0.6–1.1)
Calcium: 10 mg/dL (ref 8.4–10.4)
Chloride: 103 mEq/L (ref 98–109)
EGFR: >90 mL/min/{1.73_m2} (ref >90)
Glucose: 97 mg/dl (ref 70–140)
Potassium: 3.8 mEq/L (ref 3.5–5.1)
SODIUM: 138 meq/L (ref 136–145)
Total Protein: 8 g/dL (ref 6.4–8.3)

## 2014-12-10 LAB — CBC WITH DIFFERENTIAL/PLATELET
BASO%: 1.4 % (ref 0.0–2.0)
Basophils Absolute: 0.1 10*3/uL (ref 0.0–0.1)
EOS%: 0.6 % (ref 0.0–7.0)
Eosinophils Absolute: 0 10*3/uL (ref 0.0–0.5)
HCT: 32 % — ABNORMAL LOW (ref 34.8–46.6)
HEMOGLOBIN: 10.1 g/dL — AB (ref 11.6–15.9)
LYMPH#: 1.1 10*3/uL (ref 0.9–3.3)
LYMPH%: 14.6 % (ref 14.0–49.7)
MCH: 25.1 pg (ref 25.1–34.0)
MCHC: 31.4 g/dL — AB (ref 31.5–36.0)
MCV: 79.8 fL (ref 79.5–101.0)
MONO#: 0.6 10*3/uL (ref 0.1–0.9)
MONO%: 7.2 % (ref 0.0–14.0)
NEUT%: 76.2 % (ref 38.4–76.8)
NEUTROS ABS: 6 10*3/uL (ref 1.5–6.5)
PLATELETS: 666 10*3/uL — AB (ref 145–400)
RBC: 4.01 10*6/uL (ref 3.70–5.45)
RDW: 21.2 % — AB (ref 11.2–14.5)
WBC: 7.8 10*3/uL (ref 3.9–10.3)

## 2014-12-10 MED ORDER — ONDANSETRON 8 MG/NS 50 ML IVPB
INTRAVENOUS | Status: AC
  Filled 2014-12-10: qty 8

## 2014-12-10 MED ORDER — DEXAMETHASONE SODIUM PHOSPHATE 10 MG/ML IJ SOLN
INTRAMUSCULAR | Status: AC
  Filled 2014-12-10: qty 1

## 2014-12-10 MED ORDER — CYCLOBENZAPRINE HCL 5 MG PO TABS: ORAL_TABLET | ORAL | Status: DC

## 2014-12-10 MED ORDER — DEXAMETHASONE SODIUM PHOSPHATE 10 MG/ML IJ SOLN
10.0000 mg | Freq: Once | INTRAMUSCULAR | Status: AC
  Administered 2014-12-10: 10 mg via INTRAVENOUS

## 2014-12-10 MED ORDER — SODIUM CHLORIDE 0.9 % IV SOLN
Freq: Once | INTRAVENOUS | Status: AC
  Administered 2014-12-10: 10:00:00 via INTRAVENOUS

## 2014-12-10 MED ORDER — SODIUM CHLORIDE 0.9 % IV SOLN
600.0000 mg/m2 | Freq: Once | INTRAVENOUS | Status: AC
  Administered 2014-12-10: 1180 mg via INTRAVENOUS
  Filled 2014-12-10: qty 59

## 2014-12-10 MED ORDER — HEPARIN SOD (PORK) LOCK FLUSH 100 UNIT/ML IV SOLN
500.0000 [IU] | Freq: Once | INTRAVENOUS | Status: AC | PRN
  Administered 2014-12-10: 500 [IU]
  Filled 2014-12-10: qty 5

## 2014-12-10 MED ORDER — FLUOROURACIL CHEMO INJECTION 2.5 GM/50ML
600.0000 mg/m2 | Freq: Once | INTRAVENOUS | Status: AC
  Administered 2014-12-10: 1200 mg via INTRAVENOUS
  Filled 2014-12-10: qty 24

## 2014-12-10 MED ORDER — SODIUM CHLORIDE 0.9 % IJ SOLN
10.0000 mL | INTRAMUSCULAR | Status: DC | PRN
  Administered 2014-12-10: 10 mL
  Filled 2014-12-10: qty 10

## 2014-12-10 MED ORDER — METHOTREXATE SODIUM CHEMO INJECTION 25 MG/ML
40.0000 mg/m2 | Freq: Once | INTRAMUSCULAR | Status: AC
  Administered 2014-12-10: 80 mg via INTRAVENOUS
  Filled 2014-12-10: qty 3.2

## 2014-12-10 MED ORDER — ONDANSETRON 8 MG/50ML IVPB (CHCC)
8.0000 mg | Freq: Once | INTRAVENOUS | Status: AC
  Administered 2014-12-10: 8 mg via INTRAVENOUS

## 2014-12-10 NOTE — Progress Notes (Signed)
Patient was identified be at risk for malnutrition on the MST secondary to poor appetite and weight loss.  Patient is a 43 year old female diagnosed with newly metastatic breast cancer.  She is a patient of Dr. Jana Hakim.  Past medical history includes migraines, anxiety, and iron deficiency anemia.  Medications include Flexeril and Ativan.  Labs include potassium 3.3, and albumin 2.6.  Height: 65 inches. Weight: 171.6 pounds December 29. Usual body weight: 200 pounds July 2015. BMI: 28.56.  Patient endorses poor appetite and decreased oral intake.   Patient has lost approximately 30 pounds over the past 5 months. Patient has experienced nausea. States she is able to drink well.  However, eating is difficult.  Nutrition diagnosis: Unintended weight loss related to poor appetite and metastatic cancer as evidenced by 14% weight loss over 5 months.  Patient meets criteria for severe malnutrition in the context of chronic illness secondary to greater than 10% weight loss over 6 months and less than 75% energy intake for greater than one month.  Intervention: Patient educated to consume higher calorie, higher protein foods in 6 meals and snacks daily. Encouraged patient to begin Ensure Plus or boost +3 times a day between meals. Provided coupons for purchase. Educated patient on importance of weight maintenance. Fact sheets on poor appetite and increasing calories and protein along with contact information provided.  Monitoring, evaluation, goals: Patient will work to increase oral intake at meals and begin Ensure Plus 3 times a day between meals.  Next visit: Tuesday, February 9, during chemotherapy.  **Disclaimer: This note was dictated with voice recognition software. Similar sounding words can inadvertently be transcribed and this note may contain transcription errors which may not have been corrected upon publication of note.**

## 2014-12-10 NOTE — Telephone Encounter (Signed)
, °

## 2014-12-10 NOTE — Progress Notes (Signed)
ID: Erika [name] OB: [birth year]  MR#: [id number]  CSN#:[id number]  PCP: Erika Mink, MD GYN:   SU: Dr. Donne [name] OTHER MD:  CHIEF COMPLAINT:  Newly metastatic breast cancer CURRENT TREATMENT: [facility],  goserelin   BREAST CANCER HISTORY: From Dr. Dana [name] original intake note:  "Patient developed a left sided breast mass show up about 6 months prior to diagnosis. 2 months prior this began getting larger rapidly. She was seen in an urgent care where this mass was thought to be an abscess. Apparently there was an attempt to excise or drain this that was not successful and she was referred to Dr. Donne [name] for evaluation. She underwent punch biopsy on 04/23/13 of her skin and the mass. She was then also sent for mm/us. The biopsy shows invasive high grade carcinoma that appears to be breast. It is ER 14%, PR negative, HER-2/neu negative with a Ki-67 that approaches 100%. She has also undergone mm with at least 10 cm upper inner left breast tumor with mild overyling skin thickening. There is also a 3x10 mm cluster of calcs near there also. There is indeterminate 7 mm level 1 left axillary node, she underwent a lymph node biopsy that was positive for disease. No mm evidence of right breast malignancy. An MRI of the breasts was done on 05/22/13 and showed a 14 cm mass in the left breast protruding from the skin and also a 2.5 cm level II axillary node."  Her subsequent history is as detailed below  INTERVAL HISTORY:   Erika [name] returns today for follow up of her breast cancer, accompanied by her significant other, Erika [name]. Today is day 1, cycle 2 of cyclophosphamide, methotrexate, and fluorouracil given every 21 days   REVIEW OF SYSTEMS:  Erika [name] denies fevers, chills, nausea, vomiting, or changes in bowel or bladder habits. She has been trying to eat and using her appetite enhancer every other day, but has managed to lose almost 5lb since her last visit. She uses protein shakes about twice  weekly. She denies mouth sores, or rashes, but does still have peeling to her hands and feet. This is not painful or erythematous. She is using moisturizing balms to these areas of dryness.The numbness to her fingertips is unchanged. Her pain is managed well with 100 mcg worth of fentanyl patches and oxycodone PRN. She has restarted the flexeril for her leg spasms and "charlie horse" like sensation she gets to her hamstrings and glutes. Her fatigue is better managed by resting more frequently. She denies shortness of breath, chest pain, cough, or palpitations. A detailed review of systems is otherwise negative.   PAST MEDICAL HISTORY: Past Medical History  Diagnosis Date  . Breast cancer     Invasive High Grade Carcinoma  . Headache(784.0)     Hx: of Migraines  . Status post chemotherapy     Adriamycin and Cytoxan  . Anxiety     ANXIOUS ABOUT HAVEING BIG SURGERY - MASTECTOMY  . 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 DVT (deep venous thrombosis)     RT ARM-PT ON LOVENOX  . Hx of iron deficiency anemia 11/26/13  . S/P radiation therapy 02-04-14 to 03-18-14                               1) Left Chest Wall / 50 Gy in 25 fractions/ 2) Left Supraclavicular fossa/ 46 Gy in 23 fractions / 3) Left Posterior Axillary  boost / 8.533 Gy in 23 fractions/ 4) Left Chest Wall Scar boost / 10 Gy in 5 fractions    PAST SURGICAL HISTORY: Past Surgical History  Procedure Laterality Date  . Tubal ligation    . Portacath placement Right 05/22/2013    Procedure: INSERTION PORT-A-CATH;  Surgeon: [name], MD;  Location: [facility];  Service: General;  Laterality: Right;  . Left breast needle core biopsy Left 05/21/13  . Left breast needle core  biopsy Left 06/22/13  . Mastectomy modified radical Left 11/15/2013    Procedure: LEFT MASTECTOMY MODIFIED RADICAL;  Surgeon: [name], MD;  Location: [facility];  Service: General;  Laterality: Left;    FAMILY HISTORY Family History  Problem Relation Age of  Onset  . Hypertension Mother   . Diabetes Father 34  . Stomach cancer Maternal Aunt   . Hypertension Maternal Grandmother   . Hypertension Maternal Grandfather   . Diabetes Paternal Grandmother   . Diabetes Paternal Grandfather   . Diabetes Paternal Uncle   . Diabetes Paternal Uncle   . Diabetes Paternal Uncle   The patient's mother is alive, age 40. The patient's father died from complications of diabetes at the age of 88. The patient has 2 brothers, one sister. There is no history of breast or ovarian cancer in the family to her knowledge .the patient has not undergone genetic testing   GYNECOLOGIC HISTORY:  Menarche age 73, first live birth age 31. The patient is GX P3. The patient has been on goserelin since 06/13/2013, given monthly  SOCIAL HISTORY:  The patient has worked at as a Furniture conservator/restorer and a Retail buyer. She is currently applying for disability. At home she lives with her youngest son, Erika [name], 70; with [name]; and with [name].   ADVANCED DIRECTIVES: Not in place  HEALTH MAINTENANCE: History  Substance Use Topics  . Smoking status: Never Smoker   . Smokeless tobacco: Never Used  . Alcohol Use: No     Mammogram: 06/22/2013 Colonoscopy: Bone Density Scan:  Pap Smear:  Eye Exam:  Vitamin D Level:   Lipid Panel:    Allergies  Allergen Reactions  . Doxycycline Nausea And Vomiting    Current Outpatient Prescriptions  Medication Sig Dispense Refill  . cyclobenzaprine (FLEXERIL) 5 MG tablet TAKE 1 TABLET BY MOUTH THREE TIMES A DAY FOR MUSCLE SPASMS 30 tablet 1  . fentaNYL (DURAGESIC - DOSED MCG/HR) 25 MCG/HR patch Place 1 patch (25 mcg total) onto the skin every 3 (three) days. 10 patch 0  . fentaNYL (DURAGESIC - DOSED MCG/HR) 75 MCG/HR Place 1 patch (75 mcg total) onto the skin every 3 (three) days. 10 patch 0  . gabapentin (NEURONTIN) 300 MG capsule Take 1 capsule (300 mg total) by mouth 3 (three) times daily.  90 capsule 4  . ibuprofen (ADVIL,MOTRIN) 200 MG tablet Take 600-800 mg by mouth every 4 (four) hours as needed for mild pain or moderate pain.     Marland Kitchen loratadine (CLARITIN) 10 MG tablet Take 10 mg by mouth daily as needed for allergies.    Marland Kitchen LORazepam (ATIVAN) 0.5 MG tablet Take 0.5 mg by mouth every 6 (six) hours as needed for anxiety.    Marland Kitchen HYDROcodone-homatropine (HYCODAN) 5-1.5 MG/5ML syrup Take 5 mLs by mouth every 6 (six) hours as needed for cough. (Patient not taking: Reported on 11/19/2014) 240 mL 0  . oxyCODONE (OXY IR/ROXICODONE) 5 MG immediate release tablet Take 1 tablet (5 mg total)  by mouth every 8 (eight) hours as needed for severe pain or breakthrough pain. 90 tablet 0  . potassium chloride SA (K-DUR,KLOR-CON) 20 MEQ tablet Take 1 tablet (20 mEq total) by mouth 4 (four) times daily. (Patient not taking: Reported on 11/26/2014) 120 tablet 1  . PRESCRIPTION MEDICATION PT REPORTS RECEIVING A SHOT ONCE A MONTH AT CANCER CENTER TO STOP HER MENSTRUAL PERIODS - I SEE DOCUMENTATION IN OFFICE NOTES FROM CANCER CENTER THAT PT WAS GETTING ZOLADEX SQ EVERY 28 DAYS.    Marland Kitchen traMADol (ULTRAM) 50 MG tablet Take 1-2 tablets (50-100 mg total) by mouth every 6 (six) hours as needed. 30 tablet 1  . UNABLE TO FIND Rx: L8000- Post Surgical Bra (Quantity: 6) T3646- Non Silicone Breast prosthesis (Quantity: 1) Dx: 174.9; Left mastectomy 1 each 0  . zolpidem (AMBIEN) 5 MG tablet Take 1 tablet (5 mg total) by mouth at bedtime as needed for sleep. 30 tablet 0   No current facility-administered medications for this visit.   Facility-Administered Medications Ordered in Other Visits  Medication Dose Route Frequency Provider Last Rate Last Dose  . topical emolient (BIAFINE) emulsion   Topical Daily [name], MD        OBJECTIVE: middle-aged African American woman in no acute distress Filed Vitals:   12/10/14 0927  BP: 100/76  Pulse: 88  Temp: 98.1 F (36.7 C)  Resp: 18     Body mass index is 28.56  kg/(m^2).      ECOG FS:1 - Symptomatic but completely ambulatory   Skin: warm, dry, scattered spots of peeling noted to her bilateral palms, no erythema or pain, hyperpigmented bases of nail Sclerae unicteric, pupils equal and reactive Oropharynx clear and moist-- no thrush No cervical or supraclavicular adenopathy Lungs no rales or rhonchi Heart regular rate and rhythm Abd soft, nontender, positive bowel sounds MSK no focal spinal tenderness, no upper extremity lymphedema Neuro: nonfocal, well oriented, appropriate affect Breasts: deferred   LAB RESULTS:  CMP     Component Value Date/Time   NA 138 11/26/2014 0903   NA 138 11/16/2013 0448   K 3.3* 11/26/2014 0903   K 3.5 11/16/2013 0448   CL 103 11/16/2013 0448   CL 102 06/01/2013 1500   CO2 29 11/26/2014 0903   CO2 27 11/16/2013 0448   GLUCOSE 89 11/26/2014 0903   GLUCOSE 116* 11/16/2013 0448   GLUCOSE 95 06/01/2013 1500   BUN 8.5 11/26/2014 0903   BUN 8 11/16/2013 0448   CREATININE 0.7 11/26/2014 0903   CREATININE 0.86 11/16/2013 0448   CALCIUM 9.6 11/26/2014 0903   CALCIUM 8.9 11/16/2013 0448   PROT 7.9 11/26/2014 0903   PROT 8.2 05/18/2013 1205   ALBUMIN 2.6* 11/26/2014 0903   ALBUMIN 2.8* 05/18/2013 1205   AST 16 11/26/2014 0903   AST 18 05/18/2013 1205   ALT 13 11/26/2014 0903   ALT 16 05/18/2013 1205   ALKPHOS 124 11/26/2014 0903   ALKPHOS 111 05/18/2013 1205   BILITOT 0.60 11/26/2014 0903   BILITOT 0.2* 05/18/2013 1205   GFRNONAA 82* 11/16/2013 0448   GFRAA >90 11/16/2013 0448    I No results found for: SPEP  Lab Results  Component Value Date   WBC 7.8 12/10/2014   NEUTROABS 6.0 12/10/2014   HGB 10.1* 12/10/2014   HCT 32.0* 12/10/2014   MCV 79.8 12/10/2014   PLT 666* 12/10/2014      Chemistry      Component Value Date/Time   NA 138 11/26/2014 0903  NA 138 11/16/2013 0448   K 3.3* 11/26/2014 0903   K 3.5 11/16/2013 0448   CL 103 11/16/2013 0448   CL 102 06/01/2013 1500   CO2 29  11/26/2014 0903   CO2 27 11/16/2013 0448   BUN 8.5 11/26/2014 0903   BUN 8 11/16/2013 0448   CREATININE 0.7 11/26/2014 0903   CREATININE 0.86 11/16/2013 0448      Component Value Date/Time   CALCIUM 9.6 11/26/2014 0903   CALCIUM 8.9 11/16/2013 0448   ALKPHOS 124 11/26/2014 0903   ALKPHOS 111 05/18/2013 1205   AST 16 11/26/2014 0903   AST 18 05/18/2013 1205   ALT 13 11/26/2014 0903   ALT 16 05/18/2013 1205   BILITOT 0.60 11/26/2014 0903   BILITOT 0.2* 05/18/2013 1205       Lab Results  Component Value Date   LABCA2 35 05/18/2013    No components found for: OIBBC488  No results for input(s): INR in the last 168 hours.  Urinalysis No results found for: COLORURINE  STUDIES: No results found.  ASSESSMENT: 43 y.o.  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  (0) genetic testing of the ATM, BARD1, BRCA1, BRCA2, BRIP1, CDH1, CHEK2, EPCAM, FANCC, MLH1, MSH2, MSH6, NBN, PALB2, PMS2, PTEN, RAD51C, RAD51D, STK11, TP53, and XRCC2 genes showed no deleterious mutations  (1) status post left breast biopsy 04/23/2013 for a clinical T4 N1, stage IIIB invasive ductal carcinoma, grade 3,  estrogen receptor 14% positive with moderate staining intensity, progesterone receptor negative,  with an MIB-1 of  100% and HER-2/neu  nonamplified  (2) biopsy of a left axillary lymph node [phone] was positive  (3) started monthly goserelin July of 02/01/2013  (4)  received  neoadjuvant chemotherapy with doxorubicin and cyclophosphamide in dose dense fashion x4, followed by carboplatin and paclitaxel x1, discontinued due to neuropathy, followed by carboplatin and gemcitabine, both given day 1 of each 14 day cycle x6, completed 10/12/2013 .   (5) right upper extremity DVT documented 06/29/2013,  treated with Lovenox from  July  through  December 2014    (6) status post left modified radical mastectomy 11/15/2013 for a residual pT3 pN0 invasive ductal carcinoma,  grade 3, with repeat prognostic panel triple negative. All 12 axillary lymph nodes were clear. Margins were negative   (5) completed adjuvant radiation therapy 03/18/2014,  with capecitabine added 2-3 weeks into treatment.  METASTATIC DISEASE  (6) CT scans of the chest, abdomen, and pelvis 06/26/2014 showed a large right pleural effusion  with pleural studding, 2 left upper lobe nodules, and two  liver lesions. Cytology from right thoracentesis 07/03/2014 showed malignant cells consistent with the patient's known primary  (7) started eribulin 07/09/2014, given days 1 and 8 of each 21 day cycle; restagind studies after cycle 3 showed evidence of response. Discontinued after cycle 5 (last dose 10/08/14) because of worsening peripheral neuropathy--also with progression.   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 The labs were reviewed in detail and were entirely stable. Her hgb has improved to 10.1 and we will continue to monitor this value. She will proceed with cycle 2 of cyclophosphamide, methotrexate, and fluorouracil.   The nutritionist will visit her in infusion today. I suggested she increase her protein shakes from twice weekly to daily to help maintain  her weight at least.  I have refilled her flexeril for her leg spasms.  Eliot will return next week for labs and a nadir visit. She understands and agrees with this plan. She knows the goal of treatment in her case is control. She has been encouraged to call with any issues that might arise before her next visit here.     Marcelino Duster, [place] [phone] 12/10/2014 9:29 AM

## 2014-12-10 NOTE — Telephone Encounter (Signed)
Per staff message and POF I have scheduled appts. Advised scheduler of appts. JMW  

## 2014-12-10 NOTE — Patient Instructions (Signed)
[facility] Discharge Instructions for Patients Receiving Chemotherapy  Today you received the following chemotherapy agents Cytoxan/Methotrexate/5-FU.  To help prevent nausea and vomiting after your treatment, we encourage you to take your nausea medication as directed.    If you develop nausea and vomiting that is not controlled by your nausea medication, call the clinic.   BELOW ARE SYMPTOMS THAT SHOULD BE REPORTED IMMEDIATELY:  *FEVER GREATER THAN 100.5 F  *CHILLS WITH OR WITHOUT FEVER  NAUSEA AND VOMITING THAT IS NOT CONTROLLED WITH YOUR NAUSEA MEDICATION  *UNUSUAL SHORTNESS OF BREATH  *UNUSUAL BRUISING OR BLEEDING  TENDERNESS IN MOUTH AND THROAT WITH OR WITHOUT PRESENCE OF ULCERS  *URINARY PROBLEMS  *BOWEL PROBLEMS  UNUSUAL RASH Items with * indicate a potential emergency and should be followed up as soon as possible.  Feel free to call the clinic you have any questions or concerns. The clinic phone number is [phone].

## 2014-12-12 ENCOUNTER — Other Ambulatory Visit: Payer: Self-pay | Admitting: Emergency Medicine

## 2014-12-12 ENCOUNTER — Ambulatory Visit: Payer: PRIVATE HEALTH INSURANCE

## 2014-12-12 ENCOUNTER — Other Ambulatory Visit: Payer: Self-pay | Admitting: Nurse Practitioner

## 2014-12-12 DIAGNOSIS — C50212 Malignant neoplasm of upper-inner quadrant of left female breast: Secondary | ICD-10-CM

## 2014-12-12 MED ORDER — FENTANYL 75 MCG/HR TD PT72: 75.0000 ug | MEDICATED_PATCH | TRANSDERMAL | Status: DC

## 2014-12-12 MED ORDER — FENTANYL 25 MCG/HR TD PT72: 25.0000 ug | MEDICATED_PATCH | TRANSDERMAL | Status: DC

## 2014-12-12 MED ORDER — OXYCODONE HCL 5 MG PO TABS: 5.0000 mg | ORAL_TABLET | Freq: Three times a day (TID) | ORAL | Status: DC | PRN

## 2014-12-17 ENCOUNTER — Encounter: Payer: Self-pay | Admitting: Nurse Practitioner

## 2014-12-17 ENCOUNTER — Ambulatory Visit (HOSPITAL_BASED_OUTPATIENT_CLINIC_OR_DEPARTMENT_OTHER): Payer: No Typology Code available for payment source | Admitting: Nurse Practitioner

## 2014-12-17 ENCOUNTER — Other Ambulatory Visit (HOSPITAL_BASED_OUTPATIENT_CLINIC_OR_DEPARTMENT_OTHER): Payer: No Typology Code available for payment source

## 2014-12-17 VITALS — BP 99/55 | HR 90 | Temp 97.8°F | Resp 18 | Ht 65.0 in | Wt 173.5 lb

## 2014-12-17 DIAGNOSIS — C50912 Malignant neoplasm of unspecified site of left female breast: Secondary | ICD-10-CM

## 2014-12-17 DIAGNOSIS — C50212 Malignant neoplasm of upper-inner quadrant of left female breast: Secondary | ICD-10-CM

## 2014-12-17 DIAGNOSIS — Z86718 Personal history of other venous thrombosis and embolism: Secondary | ICD-10-CM

## 2014-12-17 DIAGNOSIS — G620 Drug-induced polyneuropathy: Secondary | ICD-10-CM

## 2014-12-17 DIAGNOSIS — J910 Malignant pleural effusion: Secondary | ICD-10-CM

## 2014-12-17 DIAGNOSIS — C773 Secondary and unspecified malignant neoplasm of axilla and upper limb lymph nodes: Secondary | ICD-10-CM

## 2014-12-17 DIAGNOSIS — T451X5A Adverse effect of antineoplastic and immunosuppressive drugs, initial encounter: Secondary | ICD-10-CM

## 2014-12-17 DIAGNOSIS — D630 Anemia in neoplastic disease: Secondary | ICD-10-CM | POA: Insufficient documentation

## 2014-12-17 DIAGNOSIS — D6481 Anemia due to antineoplastic chemotherapy: Secondary | ICD-10-CM

## 2014-12-17 LAB — COMPREHENSIVE METABOLIC PANEL (CC13)
ALBUMIN: 2.6 g/dL — AB (ref 3.5–5.0)
ALK PHOS: 128 U/L (ref 40–150)
ALT: 10 U/L (ref 0–55)
AST: 17 U/L (ref 5–34)
Anion Gap: 8 mEq/L (ref 3–11)
BUN: 9.7 mg/dL (ref 7.0–26.0)
CHLORIDE: 101 meq/L (ref 98–109)
CO2: 27 meq/L (ref 22–29)
CREATININE: 0.7 mg/dL (ref 0.6–1.1)
Calcium: 9.5 mg/dL (ref 8.4–10.4)
Glucose: 108 mg/dl (ref 70–140)
Potassium: 3.8 mEq/L (ref 3.5–5.1)
SODIUM: 137 meq/L (ref 136–145)
Total Bilirubin: 0.6 mg/dL (ref 0.20–1.20)
Total Protein: 7.7 g/dL (ref 6.4–8.3)

## 2014-12-17 LAB — CBC WITH DIFFERENTIAL/PLATELET
BASO%: 0 % (ref 0.0–2.0)
BASOS ABS: 0 10*3/uL (ref 0.0–0.1)
EOS ABS: 0 10*3/uL (ref 0.0–0.5)
EOS%: 0 % (ref 0.0–7.0)
HCT: 27.5 % — ABNORMAL LOW (ref 34.8–46.6)
HEMOGLOBIN: 8.6 g/dL — AB (ref 11.6–15.9)
LYMPH#: 0.6 10*3/uL — AB (ref 0.9–3.3)
LYMPH%: 15.8 % (ref 14.0–49.7)
MCH: 24.8 pg — ABNORMAL LOW (ref 25.1–34.0)
MCHC: 31.3 g/dL — ABNORMAL LOW (ref 31.5–36.0)
MCV: 79.3 fL — ABNORMAL LOW (ref 79.5–101.0)
MONO#: 0 10*3/uL — ABNORMAL LOW (ref 0.1–0.9)
MONO%: 0.3 % (ref 0.0–14.0)
NEUT#: 3.3 10*3/uL (ref 1.5–6.5)
NEUT%: 83.9 % — ABNORMAL HIGH (ref 38.4–76.8)
Platelets: 210 10*3/uL (ref 145–400)
RBC: 3.47 10*6/uL — ABNORMAL LOW (ref 3.70–5.45)
RDW: 19.6 % — AB (ref 11.2–14.5)
WBC: 3.9 10*3/uL (ref 3.9–10.3)

## 2014-12-17 MED ORDER — HYDROCODONE-HOMATROPINE 5-1.5 MG/5ML PO SYRP: 5.0000 mL | ORAL_SOLUTION | Freq: Four times a day (QID) | ORAL | Status: DC | PRN

## 2014-12-17 NOTE — Progress Notes (Signed)
ID: Erika [name] OB: 12/27/1970  MR#: [id number]  CSN#:[id number]  PCP: [name], MD GYN:   SU: Dr. [name] OTHER MD:  CHIEF COMPLAINT:  Newly metastatic breast cancer CURRENT TREATMENT: [facility]F,  goserelin   BREAST CANCER HISTORY: From Dr. [name] original intake note:  "Patient developed a left sided breast mass show up about 6 months prior to diagnosis. 2 months prior this began getting larger rapidly. She was seen in an urgent care where this mass was thought to be an abscess. Apparently there was an attempt to excise or drain this that was not successful and she was referred to Dr. [name] for evaluation. She underwent punch biopsy on 04/23/13 of her skin and the mass. She was then also sent for mm/us. The biopsy shows invasive high grade carcinoma that appears to be breast. It is ER 14%, PR negative, HER-2/neu negative with a Ki-67 that approaches 100%. She has also undergone mm with at least 10 cm upper inner left breast tumor with mild overyling skin thickening. There is also a 3x10 mm cluster of calcs near there also. There is indeterminate 7 mm level 1 left axillary node, she underwent a lymph node biopsy that was positive for disease. No mm evidence of right breast malignancy. An MRI of the breasts was done on 05/22/13 and showed a 14 cm mass in the left breast protruding from the skin and also a 2.5 cm level II axillary node."  Her subsequent history is as detailed below  INTERVAL HISTORY:   Erika [name] returns today for follow up of her breast cancer, accompanied by her significant other, Erika [name]. Today is day 8, cycle 2 of cyclophosphamide, methotrexate, and fluorouracil given every 21 days.   REVIEW OF SYSTEMS:  Erika [name] denies fevers, chills, nausea, vomiting, or changes in bowel or bladder habits. She is eating slightly better and has gained 2lb since her last visit. She denies mouth sores, or rashes. Her hands are still dry but they are not peeling.  She is  using moisturizing balms to these areas of dryness.The numbness to her fingertips is unchanged. Her pain is managed well with 100 mcg worth of fentanyl patches and oxycodone PRN. She has restarted the flexeril for her leg spasms and "charlie horse" like sensation she gets to her hamstrings and glutes. Her fatigue is better managed by resting more frequently. She denies shortness of breath, chest pain, cough, or palpitations. A detailed review of systems is otherwise negative.   PAST MEDICAL HISTORY: Past Medical History  Diagnosis Date  . Breast cancer     Invasive High Grade Carcinoma  . Headache(784.0)     Hx: of Migraines  . Status post chemotherapy     Adriamycin and Cytoxan  . Anxiety     ANXIOUS ABOUT HAVEING BIG SURGERY - MASTECTOMY  . 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 DVT (deep venous thrombosis)     RT ARM-PT ON LOVENOX  . Hx of iron deficiency anemia 11/26/13  . S/P radiation therapy 02-04-14 to 03-18-14                               1) Left Chest Wall / 50 Gy in 25 fractions/ 2) Left Supraclavicular fossa/ 46 Gy in 23 fractions / 3) Left Posterior Axillary boost / 8.533 Gy in 23 fractions/ 4) Left Chest Wall Scar boost / 10 Gy in 5 fractions    PAST SURGICAL  HISTORY: Past Surgical History  Procedure Laterality Date  . Tubal ligation    . Portacath placement Right 05/22/2013    Procedure: INSERTION PORT-A-CATH;  Surgeon: [name], MD;  Location: [place];  Service: General;  Laterality: Right;  . Left breast needle core biopsy Left 05/21/13  . Left breast needle core  biopsy Left 06/22/13  . Mastectomy modified radical Left 11/15/2013    Procedure: LEFT MASTECTOMY MODIFIED RADICAL;  Surgeon: [name], MD;  Location: [facility];  Service: General;  Laterality: Left;    FAMILY HISTORY Family History  Problem Relation Age of Onset  . Hypertension Mother   . Diabetes Father 34  . Stomach cancer Maternal Aunt   . Hypertension Maternal Grandmother   .  Hypertension Maternal Grandfather   . Diabetes Paternal Grandmother   . Diabetes Paternal Grandfather   . Diabetes Paternal Uncle   . Diabetes Paternal Uncle   . Diabetes Paternal Uncle   The patient's mother is alive, age 47. The patient's father died from complications of diabetes at the age of 71. The patient has 2 brothers, one sister. There is no history of breast or ovarian cancer in the family to her knowledge .the patient has not undergone genetic testing   GYNECOLOGIC HISTORY:  Menarche age 85, first live birth age 21. The patient is GX P3. The patient has been on goserelin since 06/13/2013, given monthly  SOCIAL HISTORY:  The patient has worked at as a Furniture conservator/restorer and a Retail buyer. She is currently applying for disability. At home she lives with her youngest son, Erika [name], 75; with [name]; and with [name].   ADVANCED DIRECTIVES: Not in place  HEALTH MAINTENANCE: History  Substance Use Topics  . Smoking status: Never Smoker   . Smokeless tobacco: Never Used  . Alcohol Use: No     Mammogram: 06/22/2013 Colonoscopy: Bone Density Scan:  Pap Smear:  Eye Exam:  Vitamin D Level:   Lipid Panel:    Allergies  Allergen Reactions  . Doxycycline Nausea And Vomiting    Current Outpatient Prescriptions  Medication Sig Dispense Refill  . cyclobenzaprine (FLEXERIL) 5 MG tablet TAKE 1 TABLET BY MOUTH THREE TIMES A DAY FOR MUSCLE SPASMS 30 tablet 1  . fentaNYL (DURAGESIC - DOSED MCG/HR) 25 MCG/HR patch Place 1 patch (25 mcg total) onto the skin every 3 (three) days. 10 patch 0  . fentaNYL (DURAGESIC - DOSED MCG/HR) 75 MCG/HR Place 1 patch (75 mcg total) onto the skin every 3 (three) days. 10 patch 0  . gabapentin (NEURONTIN) 300 MG capsule Take 1 capsule (300 mg total) by mouth 3 (three) times daily. 90 capsule 4  . HYDROcodone-homatropine (HYCODAN) 5-1.5 MG/5ML syrup Take 5 mLs by mouth every 6 (six) hours as needed for cough.  240 mL 0  . ibuprofen (ADVIL,MOTRIN) 200 MG tablet Take 600-800 mg by mouth every 4 (four) hours as needed for mild pain or moderate pain.     Marland Kitchen loratadine (CLARITIN) 10 MG tablet Take 10 mg by mouth daily as needed for allergies.    Marland Kitchen LORazepam (ATIVAN) 0.5 MG tablet Take 0.5 mg by mouth every 6 (six) hours as needed for anxiety.    Marland Kitchen oxyCODONE (OXY IR/ROXICODONE) 5 MG immediate release tablet Take 1 tablet (5 mg total) by mouth every 8 (eight) hours as needed for severe pain or breakthrough pain. 90 tablet 0  . potassium chloride SA (K-DUR,KLOR-CON) 20 MEQ tablet Take 1 tablet (20  mEq total) by mouth 4 (four) times daily. 120 tablet 1  . PRESCRIPTION MEDICATION PT REPORTS RECEIVING A SHOT ONCE A MONTH AT CANCER CENTER TO STOP HER MENSTRUAL PERIODS - I SEE DOCUMENTATION IN OFFICE NOTES FROM CANCER CENTER THAT PT WAS GETTING ZOLADEX SQ EVERY 28 DAYS.    Marland Kitchen traMADol (ULTRAM) 50 MG tablet Take 1-2 tablets (50-100 mg total) by mouth every 6 (six) hours as needed. 30 tablet 1  . UNABLE TO FIND Rx: L8000- Post Surgical Bra (Quantity: 6) Q0086- Non Silicone Breast prosthesis (Quantity: 1) Dx: 174.9; Left mastectomy 1 each 0  . zolpidem (AMBIEN) 5 MG tablet Take 1 tablet (5 mg total) by mouth at bedtime as needed for sleep. 30 tablet 0   No current facility-administered medications for this visit.   Facility-Administered Medications Ordered in Other Visits  Medication Dose Route Frequency Provider Last Rate Last Dose  . topical emolient (BIAFINE) emulsion   Topical Daily [name], MD        OBJECTIVE: middle-aged African American woman in no acute distress Filed Vitals:   12/17/14 1119  BP: 99/55  Pulse: 90  Temp: 97.8 F (36.6 C)  Resp: 18     Body mass index is 28.87 kg/(m^2).      ECOG FS:1 - Symptomatic but completely ambulatory   Skin: warm, dry  HEENT: sclerae anicteric, conjunctivae pink, oropharynx clear. No thrush or mucositis.  Lymph Nodes: No cervical or supraclavicular  lymphadenopathy  Lungs: clear to auscultation bilaterally, no rales, wheezes, or rhonci  Heart: regular rate and rhythm  Abdomen: round, soft, non tender, positive bowel sounds  Musculoskeletal: No focal spinal tenderness, no peripheral edema  Neuro: non focal, well oriented, positive affect  Breasts: deferred   LAB RESULTS:  CMP     Component Value Date/Time   NA 137 12/17/2014 1102   NA 138 11/16/2013 0448   K 3.8 12/17/2014 1102   K 3.5 11/16/2013 0448   CL 103 11/16/2013 0448   CL 102 06/01/2013 1500   CO2 27 12/17/2014 1102   CO2 27 11/16/2013 0448   GLUCOSE 108 12/17/2014 1102   GLUCOSE 116* 11/16/2013 0448   GLUCOSE 95 06/01/2013 1500   BUN 9.7 12/17/2014 1102   BUN 8 11/16/2013 0448   CREATININE 0.7 12/17/2014 1102   CREATININE 0.86 11/16/2013 0448   CALCIUM 9.5 12/17/2014 1102   CALCIUM 8.9 11/16/2013 0448   PROT 7.7 12/17/2014 1102   PROT 8.2 05/18/2013 1205   ALBUMIN 2.6* 12/17/2014 1102   ALBUMIN 2.8* 05/18/2013 1205   AST 17 12/17/2014 1102   AST 18 05/18/2013 1205   ALT 10 12/17/2014 1102   ALT 16 05/18/2013 1205   ALKPHOS 128 12/17/2014 1102   ALKPHOS 111 05/18/2013 1205   BILITOT 0.60 12/17/2014 1102   BILITOT 0.2* 05/18/2013 1205   GFRNONAA 82* 11/16/2013 0448   GFRAA >90 11/16/2013 0448    I No results found for: SPEP  Lab Results  Component Value Date   WBC 3.9 12/17/2014   NEUTROABS 3.3 12/17/2014   HGB 8.6* 12/17/2014   HCT 27.5* 12/17/2014   MCV 79.3* 12/17/2014   PLT 210 12/17/2014      Chemistry      Component Value Date/Time   NA 137 12/17/2014 1102   NA 138 11/16/2013 0448   K 3.8 12/17/2014 1102   K 3.5 11/16/2013 0448   CL 103 11/16/2013 0448   CL 102 06/01/2013 1500   CO2 27 12/17/2014 1102  CO2 27 11/16/2013 0448   BUN 9.7 12/17/2014 1102   BUN 8 11/16/2013 0448   CREATININE 0.7 12/17/2014 1102   CREATININE 0.86 11/16/2013 0448      Component Value Date/Time   CALCIUM 9.5 12/17/2014 1102   CALCIUM 8.9  11/16/2013 0448   ALKPHOS 128 12/17/2014 1102   ALKPHOS 111 05/18/2013 1205   AST 17 12/17/2014 1102   AST 18 05/18/2013 1205   ALT 10 12/17/2014 1102   ALT 16 05/18/2013 1205   BILITOT 0.60 12/17/2014 1102   BILITOT 0.2* 05/18/2013 1205       Lab Results  Component Value Date   LABCA2 35 05/18/2013    No components found for: [id number]  No results for input(s): INR in the last 168 hours.  Urinalysis No results found for: COLORURINE  STUDIES: No results found.  ASSESSMENT: 44 y.o.  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  (0) genetic testing of the ATM, BARD1, BRCA1, BRCA2, BRIP1, CDH1, CHEK2, EPCAM, FANCC, MLH1, MSH2, MSH6, NBN, PALB2, PMS2, PTEN, RAD51C, RAD51D, STK11, TP53, and XRCC2 genes showed no deleterious mutations  (1) status post left breast biopsy 04/23/2013 for a clinical T4 N1, stage IIIB invasive ductal carcinoma, grade 3,  estrogen receptor 14% positive with moderate staining intensity, progesterone receptor negative,  with an MIB-1 of  100% and HER-2/neu  nonamplified  (2) biopsy of a left axillary lymph node [phone] was positive  (3) started monthly goserelin July of 02/01/2013  (4)  received  neoadjuvant chemotherapy with doxorubicin and cyclophosphamide in dose dense fashion x4, followed by carboplatin and paclitaxel x1, discontinued due to neuropathy, followed by carboplatin and gemcitabine, both given day 1 of each 14 day cycle x6, completed 10/12/2013 .   (5) right upper extremity DVT documented 06/29/2013,  treated with Lovenox from  July  through  December 2014    (6) status post left modified radical mastectomy 11/15/2013 for a residual pT3 pN0 invasive ductal carcinoma, grade 3, with repeat prognostic panel triple negative. All 12 axillary lymph nodes were clear. Margins were negative   (5) completed adjuvant radiation therapy 03/18/2014,  with capecitabine added 2-3 weeks into treatment.  METASTATIC DISEASE   (6) CT scans of the chest, abdomen, and pelvis 06/26/2014 showed a large right pleural effusion  with pleural studding, 2 left upper lobe nodules, and two  liver lesions. Cytology from right thoracentesis 07/03/2014 showed malignant cells consistent with the patient's known primary  (7) started eribulin 07/09/2014, given days 1 and 8 of each 21 day cycle; restagind studies after cycle 3 showed evidence of response. Discontinued after cycle 5 (last dose 10/08/14) because of worsening peripheral neuropathy--also with progression.   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 The labs were reviewed in detail and were entirely stable. Her hgb is back down to 8.6, but she is asymptomatic at this time.   We spent some time discussing insurance issues. They have been refusing to pay for a large quantity of treatments and medications, despite the fact that she has been paying regularly and on time. I will speak to Erika [name] regarding this matter. The patient has corrected one known issue (correcting her birthdate) but this has not been reflected in her records with [facility].   Erika [name] will return in 2  weeks for the start of cycle 3 of CMF. She understands and agrees with this plan. She knows the goal of treatment in her case is control. She has been encouraged to call with any issues that might arise before her next visit here.  Erika [name], [place] [phone] 12/17/2014 1:39 PM

## 2014-12-31 ENCOUNTER — Ambulatory Visit (HOSPITAL_BASED_OUTPATIENT_CLINIC_OR_DEPARTMENT_OTHER): Payer: PRIVATE HEALTH INSURANCE | Admitting: Nurse Practitioner

## 2014-12-31 ENCOUNTER — Ambulatory Visit (HOSPITAL_BASED_OUTPATIENT_CLINIC_OR_DEPARTMENT_OTHER): Payer: PRIVATE HEALTH INSURANCE

## 2014-12-31 ENCOUNTER — Other Ambulatory Visit (HOSPITAL_BASED_OUTPATIENT_CLINIC_OR_DEPARTMENT_OTHER): Payer: No Typology Code available for payment source

## 2014-12-31 ENCOUNTER — Encounter: Payer: Self-pay | Admitting: Nurse Practitioner

## 2014-12-31 VITALS — BP 102/73 | HR 89 | Temp 98.6°F | Resp 18 | Ht 65.0 in | Wt 171.0 lb

## 2014-12-31 DIAGNOSIS — C773 Secondary and unspecified malignant neoplasm of axilla and upper limb lymph nodes: Secondary | ICD-10-CM

## 2014-12-31 DIAGNOSIS — G620 Drug-induced polyneuropathy: Secondary | ICD-10-CM

## 2014-12-31 DIAGNOSIS — G893 Neoplasm related pain (acute) (chronic): Secondary | ICD-10-CM

## 2014-12-31 DIAGNOSIS — C50212 Malignant neoplasm of upper-inner quadrant of left female breast: Secondary | ICD-10-CM

## 2014-12-31 DIAGNOSIS — D75839 Thrombocytosis, unspecified: Secondary | ICD-10-CM

## 2014-12-31 DIAGNOSIS — C50912 Malignant neoplasm of unspecified site of left female breast: Secondary | ICD-10-CM

## 2014-12-31 DIAGNOSIS — Z86718 Personal history of other venous thrombosis and embolism: Secondary | ICD-10-CM

## 2014-12-31 DIAGNOSIS — D473 Essential (hemorrhagic) thrombocythemia: Secondary | ICD-10-CM

## 2014-12-31 DIAGNOSIS — E876 Hypokalemia: Secondary | ICD-10-CM

## 2014-12-31 DIAGNOSIS — J910 Malignant pleural effusion: Secondary | ICD-10-CM

## 2014-12-31 DIAGNOSIS — Z5111 Encounter for antineoplastic chemotherapy: Secondary | ICD-10-CM

## 2014-12-31 DIAGNOSIS — Z452 Encounter for adjustment and management of vascular access device: Secondary | ICD-10-CM

## 2014-12-31 LAB — COMPREHENSIVE METABOLIC PANEL (CC13)
ALBUMIN: 2.8 g/dL — AB (ref 3.5–5.0)
ALT: 9 U/L (ref 0–55)
AST: 22 U/L (ref 5–34)
Alkaline Phosphatase: 171 U/L — ABNORMAL HIGH (ref 40–150)
Anion Gap: 8 mEq/L (ref 3–11)
BUN: 6.3 mg/dL — ABNORMAL LOW (ref 7.0–26.0)
CALCIUM: 9.8 mg/dL (ref 8.4–10.4)
CO2: 27 mEq/L (ref 22–29)
Chloride: 104 mEq/L (ref 98–109)
Creatinine: 0.8 mg/dL (ref 0.6–1.1)
Glucose: 85 mg/dl (ref 70–140)
Potassium: 4 mEq/L (ref 3.5–5.1)
Sodium: 140 mEq/L (ref 136–145)
Total Bilirubin: 0.27 mg/dL (ref 0.20–1.20)
Total Protein: 7.8 g/dL (ref 6.4–8.3)

## 2014-12-31 LAB — CBC WITH DIFFERENTIAL/PLATELET
BASO%: 1 % (ref 0.0–2.0)
Basophils Absolute: 0.1 10*3/uL (ref 0.0–0.1)
EOS ABS: 0.1 10*3/uL (ref 0.0–0.5)
EOS%: 1.3 % (ref 0.0–7.0)
HCT: 30.8 % — ABNORMAL LOW (ref 34.8–46.6)
HGB: 9.6 g/dL — ABNORMAL LOW (ref 11.6–15.9)
LYMPH%: 17.7 % (ref 14.0–49.7)
MCH: 24.9 pg — AB (ref 25.1–34.0)
MCHC: 31.1 g/dL — AB (ref 31.5–36.0)
MCV: 80.2 fL (ref 79.5–101.0)
MONO#: 0.6 10*3/uL (ref 0.1–0.9)
MONO%: 9.9 % (ref 0.0–14.0)
NEUT%: 70.1 % (ref 38.4–76.8)
NEUTROS ABS: 4.2 10*3/uL (ref 1.5–6.5)
PLATELETS: 760 10*3/uL — AB (ref 145–400)
RBC: 3.84 10*6/uL (ref 3.70–5.45)
RDW: 23.9 % — ABNORMAL HIGH (ref 11.2–14.5)
WBC: 6 10*3/uL (ref 3.9–10.3)
lymph#: 1.1 10*3/uL (ref 0.9–3.3)

## 2014-12-31 MED ORDER — DEXAMETHASONE SODIUM PHOSPHATE 10 MG/ML IJ SOLN
INTRAMUSCULAR | Status: AC
  Filled 2014-12-31: qty 1

## 2014-12-31 MED ORDER — FLUOROURACIL CHEMO INJECTION 2.5 GM/50ML
600.0000 mg/m2 | Freq: Once | INTRAVENOUS | Status: AC
  Administered 2014-12-31: 1200 mg via INTRAVENOUS
  Filled 2014-12-31: qty 24

## 2014-12-31 MED ORDER — SODIUM CHLORIDE 0.9 % IV SOLN
600.0000 mg/m2 | Freq: Once | INTRAVENOUS | Status: AC
  Administered 2014-12-31: 1180 mg via INTRAVENOUS
  Filled 2014-12-31: qty 59

## 2014-12-31 MED ORDER — HEPARIN SOD (PORK) LOCK FLUSH 100 UNIT/ML IV SOLN
500.0000 [IU] | Freq: Once | INTRAVENOUS | Status: AC | PRN
  Administered 2014-12-31: 500 [IU]
  Filled 2014-12-31: qty 5

## 2014-12-31 MED ORDER — ALTEPLASE 2 MG IJ SOLR
2.0000 mg | Freq: Once | INTRAMUSCULAR | Status: AC | PRN
  Administered 2014-12-31: 2 mg
  Filled 2014-12-31: qty 2

## 2014-12-31 MED ORDER — METHOTREXATE SODIUM CHEMO INJECTION 25 MG/ML
40.0000 mg/m2 | Freq: Once | INTRAMUSCULAR | Status: AC
  Administered 2014-12-31: 80 mg via INTRAVENOUS
  Filled 2014-12-31: qty 3.2

## 2014-12-31 MED ORDER — SODIUM CHLORIDE 0.9 % IJ SOLN
10.0000 mL | INTRAMUSCULAR | Status: DC | PRN
  Administered 2014-12-31: 10 mL
  Filled 2014-12-31: qty 10

## 2014-12-31 MED ORDER — SODIUM CHLORIDE 0.9 % IV SOLN
Freq: Once | INTRAVENOUS | Status: AC
  Administered 2014-12-31: 13:00:00 via INTRAVENOUS

## 2014-12-31 MED ORDER — ONDANSETRON 8 MG/NS 50 ML IVPB
INTRAVENOUS | Status: AC
  Filled 2014-12-31: qty 8

## 2014-12-31 MED ORDER — ONDANSETRON 8 MG/50ML IVPB (CHCC)
8.0000 mg | Freq: Once | INTRAVENOUS | Status: AC
  Administered 2014-12-31: 8 mg via INTRAVENOUS

## 2014-12-31 MED ORDER — DEXAMETHASONE SODIUM PHOSPHATE 10 MG/ML IJ SOLN
10.0000 mg | Freq: Once | INTRAMUSCULAR | Status: AC
  Administered 2014-12-31: 10 mg via INTRAVENOUS

## 2014-12-31 NOTE — Progress Notes (Signed)
ID: Erika [name] OB: [birth year]  MR#: [id number]  CSN#:[id number]  PCP: [name], MD GYN:   SU: Dr. [name] OTHER MD:  CHIEF COMPLAINT:  Newly metastatic breast cancer CURRENT TREATMENT: [facility],  goserelin   BREAST CANCER HISTORY: From Dr. [name] original intake note:  "Patient developed a left sided breast mass show up about 6 months prior to diagnosis. 2 months prior this began getting larger rapidly. She was seen in an urgent care where this mass was thought to be an abscess. Apparently there was an attempt to excise or drain this that was not successful and she was referred to Dr. [name] for evaluation. She underwent punch biopsy on 04/23/13 of her skin and the mass. She was then also sent for mm/us. The biopsy shows invasive high grade carcinoma that appears to be breast. It is ER 14%, PR negative, HER-2/neu negative with a Ki-67 that approaches 100%. She has also undergone mm with at least 10 cm upper inner left breast tumor with mild overyling skin thickening. There is also a 3x10 mm cluster of calcs near there also. There is indeterminate 7 mm level 1 left axillary node, she underwent a lymph node biopsy that was positive for disease. No mm evidence of right breast malignancy. An MRI of the breasts was done on 05/22/13 and showed a 14 cm mass in the left breast protruding from the skin and also a 2.5 cm level II axillary node."  Her subsequent history is as detailed below  INTERVAL HISTORY:   Erika [name] returns today for follow up of her breast cancer, accompanied by her significant other, [name]. Today is day 1, cycle 3 of cyclophosphamide, methotrexate, and fluorouracil given every 21 days.   REVIEW OF SYSTEMS:  Erika [name] denies fevers, chills, or changes in bowel or bladder habits. She has had some mild nausea. Her appetite is only fair, but she is drinking well. She has gained 1 lb since her last visit. She has not had her appetite stimulant lately which usually  fixes the nausea immediately and improves her appetite, but it makes her paranoid. She denies mouth sores or rashes. The numbness to her fingertips is unchanged. Her pain is managed at a 5 with 163mg worth of fentanyl patches and oxycodone PRN. She has had no more leg spasms with regular flexeril use. She denies shortness of breath, chest pain, cough, or palpitations. She is still fatigued and just feels like she is "not at her best" right now. She sleeps off and and on. A detailed review of systems is otherwise negative.   PAST MEDICAL HISTORY: Past Medical History  Diagnosis Date  . Breast cancer     Invasive High Grade Carcinoma  . Headache(784.0)     Hx: of Migraines  . Status post chemotherapy     Adriamycin and Cytoxan  . Anxiety     ANXIOUS ABOUT HAVEING BIG SURGERY - MASTECTOMY  . 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 DVT (deep venous thrombosis)     RT ARM-PT ON LOVENOX  . Hx of iron deficiency anemia 11/26/13  . S/P radiation therapy 02-04-14 to 03-18-14                               1) Left Chest Wall / 50 Gy in 25 fractions/ 2) Left Supraclavicular fossa/ 46 Gy in 23 fractions / 3) Left Posterior Axillary boost / 8.533 Gy in 23 fractions/  4) Left Chest Wall Scar boost / 10 Gy in 5 fractions    PAST SURGICAL HISTORY: Past Surgical History  Procedure Laterality Date  . Tubal ligation    . Portacath placement Right 05/22/2013    Procedure: INSERTION PORT-A-CATH;  Surgeon: [name], MD;  Location: [facility];  Service: General;  Laterality: Right;  . Left breast needle core biopsy Left 05/21/13  . Left breast needle core  biopsy Left 06/22/13  . Mastectomy modified radical Left 11/15/2013    Procedure: LEFT MASTECTOMY MODIFIED RADICAL;  Surgeon: [name], MD;  Location: [facility];  Service: General;  Laterality: Left;    FAMILY HISTORY Family History  Problem Relation Age of Onset  . Hypertension Mother   . Diabetes Father 62  . Stomach cancer Maternal Aunt   .  Hypertension Maternal Grandmother   . Hypertension Maternal Grandfather   . Diabetes Paternal Grandmother   . Diabetes Paternal Grandfather   . Diabetes Paternal Uncle   . Diabetes Paternal Uncle   . Diabetes Paternal Uncle   The patient's mother is alive, age 43. The patient's father died from complications of diabetes at the age of 15. The patient has 2 brothers, one sister. There is no history of breast or ovarian cancer in the family to her knowledge .the patient has not undergone genetic testing   GYNECOLOGIC HISTORY:  Menarche age 28, first live birth age 12. The patient is GX P3. The patient has been on goserelin since 06/13/2013, given monthly  SOCIAL HISTORY:  The patient has worked at as a Furniture conservator/restorer and a Retail buyer. She is currently applying for disability. At home she lives with her youngest son, [name], 31; with the patient's brother Shaeleigh Graw; and with the patient's fiance [name].   ADVANCED DIRECTIVES: Not in place  HEALTH MAINTENANCE: History  Substance Use Topics  . Smoking status: Never Smoker   . Smokeless tobacco: Never Used  . Alcohol Use: No     Mammogram: 06/22/2013 Colonoscopy: Bone Density Scan:  Pap Smear:  Eye Exam:  Vitamin D Level:   Lipid Panel:    Allergies  Allergen Reactions  . Doxycycline Nausea And Vomiting    Current Outpatient Prescriptions  Medication Sig Dispense Refill  . cyclobenzaprine (FLEXERIL) 5 MG tablet TAKE 1 TABLET BY MOUTH THREE TIMES A DAY FOR MUSCLE SPASMS 30 tablet 1  . fentaNYL (DURAGESIC - DOSED MCG/HR) 25 MCG/HR patch Place 1 patch (25 mcg total) onto the skin every 3 (three) days. 10 patch 0  . fentaNYL (DURAGESIC - DOSED MCG/HR) 75 MCG/HR Place 1 patch (75 mcg total) onto the skin every 3 (three) days. 10 patch 0  . gabapentin (NEURONTIN) 300 MG capsule Take 1 capsule (300 mg total) by mouth 3 (three) times daily. 90 capsule 4  . HYDROcodone-homatropine (HYCODAN) 5-1.5 MG/5ML syrup Take 5 mLs by mouth  every 6 (six) hours as needed for cough. 240 mL 0  . loratadine (CLARITIN) 10 MG tablet Take 10 mg by mouth daily as needed for allergies.    Marland Kitchen LORazepam (ATIVAN) 0.5 MG tablet Take 0.5 mg by mouth every 6 (six) hours as needed for anxiety.    Marland Kitchen oxyCODONE (OXY IR/ROXICODONE) 5 MG immediate release tablet Take 1 tablet (5 mg total) by mouth every 8 (eight) hours as needed for severe pain or breakthrough pain. 90 tablet 0  . potassium chloride SA (K-DUR,KLOR-CON) 20 MEQ tablet Take 1 tablet (20 mEq total) by mouth 4 (four) times daily. 120 tablet  1  . PRESCRIPTION MEDICATION PT REPORTS RECEIVING A SHOT ONCE A MONTH AT CANCER CENTER TO STOP HER MENSTRUAL PERIODS - I SEE DOCUMENTATION IN OFFICE NOTES FROM CANCER CENTER THAT PT WAS GETTING ZOLADEX SQ EVERY 28 DAYS.    Marland Kitchen UNABLE TO FIND Rx: L8000- Post Surgical Bra (Quantity: 6) R7116- Non Silicone Breast prosthesis (Quantity: 1) Dx: 174.9; Left mastectomy 1 each 0  . zolpidem (AMBIEN) 5 MG tablet Take 1 tablet (5 mg total) by mouth at bedtime as needed for sleep. 30 tablet 0  . ibuprofen (ADVIL,MOTRIN) 200 MG tablet Take 600-800 mg by mouth every 4 (four) hours as needed for mild pain or moderate pain.     . traMADol (ULTRAM) 50 MG tablet Take 1-2 tablets (50-100 mg total) by mouth every 6 (six) hours as needed. (Patient not taking: Reported on 12/31/2014) 30 tablet 1   No current facility-administered medications for this visit.   Facility-Administered Medications Ordered in Other Visits  Medication Dose Route Frequency Provider Last Rate Last Dose  . topical emolient (BIAFINE) emulsion   Topical Daily [name], MD        OBJECTIVE: middle-aged African American woman in no acute distress Filed Vitals:   12/31/14 1014  BP: 102/73  Pulse: 89  Temp: 98.6 F (37 C)  Resp: 18     Body mass index is 28.46 kg/(m^2).      ECOG FS:1 - Symptomatic but completely ambulatory   Sclerae unicteric, pupils equal and reactive Oropharynx clear and  moist-- no thrush No cervical or supraclavicular adenopathy Lungs no rales or rhonchi Heart regular rate and rhythm Abd soft, nontender, positive bowel sounds MSK no focal spinal tenderness, no upper extremity lymphedema Neuro: nonfocal, well oriented, appropriate affect Breasts: deferred   LAB RESULTS:  CMP     Component Value Date/Time   NA 137 12/17/2014 1102   NA 138 11/16/2013 0448   K 3.8 12/17/2014 1102   K 3.5 11/16/2013 0448   CL 103 11/16/2013 0448   CL 102 06/01/2013 1500   CO2 27 12/17/2014 1102   CO2 27 11/16/2013 0448   GLUCOSE 108 12/17/2014 1102   GLUCOSE 116* 11/16/2013 0448   GLUCOSE 95 06/01/2013 1500   BUN 9.7 12/17/2014 1102   BUN 8 11/16/2013 0448   CREATININE 0.7 12/17/2014 1102   CREATININE 0.86 11/16/2013 0448   CALCIUM 9.5 12/17/2014 1102   CALCIUM 8.9 11/16/2013 0448   PROT 7.7 12/17/2014 1102   PROT 8.2 05/18/2013 1205   ALBUMIN 2.6* 12/17/2014 1102   ALBUMIN 2.8* 05/18/2013 1205   AST 17 12/17/2014 1102   AST 18 05/18/2013 1205   ALT 10 12/17/2014 1102   ALT 16 05/18/2013 1205   ALKPHOS 128 12/17/2014 1102   ALKPHOS 111 05/18/2013 1205   BILITOT 0.60 12/17/2014 1102   BILITOT 0.2* 05/18/2013 1205   GFRNONAA 82* 11/16/2013 0448   GFRAA >90 11/16/2013 0448    I No results found for: SPEP  Lab Results  Component Value Date   WBC 6.0 12/31/2014   NEUTROABS 4.2 12/31/2014   HGB 9.6* 12/31/2014   HCT 30.8* 12/31/2014   MCV 80.2 12/31/2014   PLT 760* 12/31/2014      Chemistry      Component Value Date/Time   NA 137 12/17/2014 1102   NA 138 11/16/2013 0448   K 3.8 12/17/2014 1102   K 3.5 11/16/2013 0448   CL 103 11/16/2013 0448   CL 102 06/01/2013 1500   CO2 27  12/17/2014 1102   CO2 27 11/16/2013 0448   BUN 9.7 12/17/2014 1102   BUN 8 11/16/2013 0448   CREATININE 0.7 12/17/2014 1102   CREATININE 0.86 11/16/2013 0448      Component Value Date/Time   CALCIUM 9.5 12/17/2014 1102   CALCIUM 8.9 11/16/2013 0448   ALKPHOS  128 12/17/2014 1102   ALKPHOS 111 05/18/2013 1205   AST 17 12/17/2014 1102   AST 18 05/18/2013 1205   ALT 10 12/17/2014 1102   ALT 16 05/18/2013 1205   BILITOT 0.60 12/17/2014 1102   BILITOT 0.2* 05/18/2013 1205       Lab Results  Component Value Date   LABCA2 35 05/18/2013    No components found for: [id number]  No results for input(s): INR in the last 168 hours.  Urinalysis No results found for: COLORURINE  STUDIES: No results found.  ASSESSMENT: 44 y.o.  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  (0) genetic testing of the ATM, BARD1, BRCA1, BRCA2, BRIP1, CDH1, CHEK2, EPCAM, FANCC, MLH1, MSH2, MSH6, NBN, PALB2, PMS2, PTEN, RAD51C, RAD51D, STK11, TP53, and XRCC2 genes showed no deleterious mutations  (1) status post left breast biopsy 04/23/2013 for a clinical T4 N1, stage IIIB invasive ductal carcinoma, grade 3,  estrogen receptor 14% positive with moderate staining intensity, progesterone receptor negative,  with an MIB-1 of  100% and HER-2/neu  nonamplified  (2) biopsy of a left axillary lymph node [id number] was positive  (3) started monthly goserelin July of 02/01/2013  (4)  received  neoadjuvant chemotherapy with doxorubicin and cyclophosphamide in dose dense fashion x4, followed by carboplatin and paclitaxel x1, discontinued due to neuropathy, followed by carboplatin and gemcitabine, both given day 1 of each 14 day cycle x6, completed 10/12/2013 .   (5) right upper extremity DVT documented 06/29/2013,  treated with Lovenox from  July  through  December 2014    (6) status post left modified radical mastectomy 11/15/2013 for a residual pT3 pN0 invasive ductal carcinoma, grade 3, with repeat prognostic panel triple negative. All 12 axillary lymph nodes were clear. Margins were negative   (5) completed adjuvant radiation therapy 03/18/2014,  with capecitabine added 2-3 weeks into treatment.  METASTATIC DISEASE  (6) CT scans of the  chest, abdomen, and pelvis 06/26/2014 showed a large right pleural effusion  with pleural studding, 2 left upper lobe nodules, and two  liver lesions. Cytology from right thoracentesis 07/03/2014 showed malignant cells consistent with the patient's known primary  (7) started eribulin 07/09/2014, given days 1 and 8 of each 21 day cycle; restagind studies after cycle 3 showed evidence of response. Discontinued after cycle 5 (last dose 10/08/14) because of worsening peripheral neuropathy--also with progression.   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 The labs were reviewed in detail and her platelet count is elevated more so than usual. I consulted with Dr. Lindi Adie, in Dr. Virgie Dad absence, and he suggested the patient start on 39m aspirin daily to prevent blood clots. TCorabellewill continue with cycle 3 of cyclophosphamide, methotrexate, and fluorouracil as scheduled.   Erika [name] return next week for labs and a follow up visit. After the completion of cycle 4 she will have repeat scans performed. She understands and agrees with this plan. She knows the goal of treatment  in her case is control. She has been encouraged to call with any issues that might arise before her next visit here.   Marcelino Duster, [place] [phone] 12/31/2014 10:39 AM

## 2014-12-31 NOTE — Patient Instructions (Signed)
[facility] Discharge Instructions for Patients Receiving Chemotherapy  Today you received the following chemotherapy agents Cytoxan, Methatrexate and 5FU  To help prevent nausea and vomiting after your treatment, we encourage you to take your nausea medication as prescribed.   If you develop nausea and vomiting that is not controlled by your nausea medication, call the clinic.   BELOW ARE SYMPTOMS THAT SHOULD BE REPORTED IMMEDIATELY:  *FEVER GREATER THAN 100.5 F  *CHILLS WITH OR WITHOUT FEVER  NAUSEA AND VOMITING THAT IS NOT CONTROLLED WITH YOUR NAUSEA MEDICATION  *UNUSUAL SHORTNESS OF BREATH  *UNUSUAL BRUISING OR BLEEDING  TENDERNESS IN MOUTH AND THROAT WITH OR WITHOUT PRESENCE OF ULCERS  *URINARY PROBLEMS  *BOWEL PROBLEMS  UNUSUAL RASH Items with * indicate a potential emergency and should be followed up as soon as possible.  Feel free to call the clinic you have any questions or concerns. The clinic phone number is [phone].

## 2015-01-07 ENCOUNTER — Encounter: Payer: Self-pay | Admitting: Nurse Practitioner

## 2015-01-07 ENCOUNTER — Other Ambulatory Visit (HOSPITAL_BASED_OUTPATIENT_CLINIC_OR_DEPARTMENT_OTHER): Payer: PRIVATE HEALTH INSURANCE

## 2015-01-07 ENCOUNTER — Telehealth: Payer: Self-pay | Admitting: Nurse Practitioner

## 2015-01-07 ENCOUNTER — Ambulatory Visit (HOSPITAL_BASED_OUTPATIENT_CLINIC_OR_DEPARTMENT_OTHER): Payer: PRIVATE HEALTH INSURANCE | Admitting: Nurse Practitioner

## 2015-01-07 VITALS — BP 97/53 | HR 126 | Temp 99.6°F | Resp 18 | Ht 65.0 in | Wt 166.0 lb

## 2015-01-07 DIAGNOSIS — C50212 Malignant neoplasm of upper-inner quadrant of left female breast: Secondary | ICD-10-CM

## 2015-01-07 DIAGNOSIS — G620 Drug-induced polyneuropathy: Secondary | ICD-10-CM

## 2015-01-07 DIAGNOSIS — C50912 Malignant neoplasm of unspecified site of left female breast: Secondary | ICD-10-CM

## 2015-01-07 DIAGNOSIS — C773 Secondary and unspecified malignant neoplasm of axilla and upper limb lymph nodes: Secondary | ICD-10-CM

## 2015-01-07 DIAGNOSIS — K1239 Other oral mucositis (ulcerative): Secondary | ICD-10-CM

## 2015-01-07 LAB — COMPREHENSIVE METABOLIC PANEL (CC13)
ALBUMIN: 2.9 g/dL — AB (ref 3.5–5.0)
ALK PHOS: 124 U/L (ref 40–150)
ALT: 11 U/L (ref 0–55)
AST: 18 U/L (ref 5–34)
Anion Gap: 13 mEq/L — ABNORMAL HIGH (ref 3–11)
BILIRUBIN TOTAL: 0.98 mg/dL (ref 0.20–1.20)
BUN: 9.9 mg/dL (ref 7.0–26.0)
CO2: 25 meq/L (ref 22–29)
CREATININE: 0.7 mg/dL (ref 0.6–1.1)
Calcium: 9.6 mg/dL (ref 8.4–10.4)
Chloride: 101 mEq/L (ref 98–109)
EGFR: >90 mL/min/{1.73_m2} (ref >90)
Glucose: 101 mg/dl (ref 70–140)
POTASSIUM: 3.8 meq/L (ref 3.5–5.1)
Sodium: 139 mEq/L (ref 136–145)
Total Protein: 8.2 g/dL (ref 6.4–8.3)

## 2015-01-07 LAB — CBC WITH DIFFERENTIAL/PLATELET
BASO%: 0 % (ref 0.0–2.0)
Basophils Absolute: 0 10*3/uL (ref 0.0–0.1)
EOS%: 0.3 % (ref 0.0–7.0)
Eosinophils Absolute: 0 10*3/uL (ref 0.0–0.5)
HEMATOCRIT: 29.6 % — AB (ref 34.8–46.6)
HGB: 9.1 g/dL — ABNORMAL LOW (ref 11.6–15.9)
LYMPH%: 14.3 % (ref 14.0–49.7)
MCH: 24.5 pg — AB (ref 25.1–34.0)
MCHC: 30.7 g/dL — AB (ref 31.5–36.0)
MCV: 79.6 fL (ref 79.5–101.0)
MONO#: 0 10*3/uL — AB (ref 0.1–0.9)
MONO%: 0.6 % (ref 0.0–14.0)
NEUT%: 84.8 % — ABNORMAL HIGH (ref 38.4–76.8)
NEUTROS ABS: 2.7 10*3/uL (ref 1.5–6.5)
Platelets: 228 10*3/uL (ref 145–400)
RBC: 3.72 10*6/uL (ref 3.70–5.45)
RDW: 20.9 % — AB (ref 11.2–14.5)
WBC: 3.2 10*3/uL — AB (ref 3.9–10.3)
lymph#: 0.5 10*3/uL — ABNORMAL LOW (ref 0.9–3.3)

## 2015-01-07 NOTE — Telephone Encounter (Signed)
per pof to sch CT[facility] will sch Scan-pt didnt stop through-will need Contrast next visit

## 2015-01-07 NOTE — Progress Notes (Signed)
ID: [name] OB: 1971/09/10  MR#: [id number]  CSN#:[id number]  PCP: [name], MD GYN:   SU: Dr. [name] OTHER MD:  CHIEF COMPLAINT:  Newly metastatic breast cancer CURRENT TREATMENT: CMF,  goserelin   BREAST CANCER HISTORY: From Dr. [name] original intake note:  "Patient developed a left sided breast mass show up about 6 months prior to diagnosis. 2 months prior this began getting larger rapidly. She was seen in an urgent care where this mass was thought to be an abscess. Apparently there was an attempt to excise or drain this that was not successful and she was referred to Dr. [name] for evaluation. She underwent punch biopsy on 04/23/13 of her skin and the mass. She was then also sent for mm/us. The biopsy shows invasive high grade carcinoma that appears to be breast. It is ER 14%, PR negative, HER-2/neu negative with a Ki-67 that approaches 100%. She has also undergone mm with at least 10 cm upper inner left breast tumor with mild overyling skin thickening. There is also a 3x10 mm cluster of calcs near there also. There is indeterminate 7 mm level 1 left axillary node, she underwent a lymph node biopsy that was positive for disease. No mm evidence of right breast malignancy. An MRI of the breasts was done on 05/22/13 and showed a 14 cm mass in the left breast protruding from the skin and also a 2.5 cm level II axillary node."  Her subsequent history is as detailed below  INTERVAL HISTORY:   [place] returns today for follow up of her breast cancer. Today is day 8, cycle 3 of cyclophosphamide, methotrexate, and fluorouracil given every 21 days.   REVIEW OF SYSTEMS:  Mireya denies fevers, chills, or changes in bowel or bladder habits. She has had some mild nausea. Her appetite is only fair, but she makes every effort to eat and drink when she can.  This past week she has been eating soft food because her mouth and throat have been sore. She does not see any mouth sores,  but her tongue is red and clearly irritated. This pain is 9/10. The numbness to her fingertips is unchanged. Her back and side pain is managed at a 5/10 with 154mcg worth of fentanyl patches and oxycodone PRN. She has had no more leg spasms with regular flexeril use. She denies shortness of breath, chest pain, cough, or palpitations. She was greatly fatigued the day or two after chemo, but she feels improved today. A detailed review of systems is otherwise negative.   PAST MEDICAL HISTORY: Past Medical History  Diagnosis Date  . Breast cancer     Invasive High Grade Carcinoma  . Headache(784.0)     Hx: of Migraines  . Status post chemotherapy     Adriamycin and Cytoxan  . Anxiety     ANXIOUS ABOUT HAVEING BIG SURGERY - MASTECTOMY  . 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 DVT (deep venous thrombosis)     RT ARM-PT ON LOVENOX  . Hx of iron deficiency anemia 11/26/13  . S/P radiation therapy 02-04-14 to 03-18-14                               1) Left Chest Wall / 50 Gy in 25 fractions/ 2) Left Supraclavicular fossa/ 46 Gy in 23 fractions / 3) Left Posterior Axillary boost / 8.533 Gy in 23 fractions/ 4) Left Chest Wall Scar  boost / 10 Gy in 5 fractions    PAST SURGICAL HISTORY: Past Surgical History  Procedure Laterality Date  . Tubal ligation    . Portacath placement Right 05/22/2013    Procedure: INSERTION PORT-A-CATH;  Surgeon: [name], MD;  Location: [facility];  Service: General;  Laterality: Right;  . Left breast needle core biopsy Left 05/21/13  . Left breast needle core  biopsy Left 06/22/13  . Mastectomy modified radical Left 11/15/2013    Procedure: LEFT MASTECTOMY MODIFIED RADICAL;  Surgeon: [name], MD;  Location: [facility];  Service: General;  Laterality: Left;    FAMILY HISTORY Family History  Problem Relation Age of Onset  . Hypertension Mother   . Diabetes Father 4  . Stomach cancer Maternal Aunt   . Hypertension Maternal Grandmother   . Hypertension  Maternal Grandfather   . Diabetes Paternal Grandmother   . Diabetes Paternal Grandfather   . Diabetes Paternal Uncle   . Diabetes Paternal Uncle   . Diabetes Paternal Uncle   The patient's mother is alive, age 77. The patient's father died from complications of diabetes at the age of 22. The patient has 2 brothers, one sister. There is no history of breast or ovarian cancer in the family to her knowledge .the patient has not undergone genetic testing   GYNECOLOGIC HISTORY:  Menarche age 2, first live birth age 34. The patient is GX P3. The patient has been on goserelin since 06/13/2013, given monthly  SOCIAL HISTORY:  The patient has worked at as a Furniture conservator/restorer and a Retail buyer. She is currently applying for disability. At home she lives with her youngest son, [name], 42; with the patient's brother Tonique Mendonca; and with the patient's fiance [name].   ADVANCED DIRECTIVES: Not in place  HEALTH MAINTENANCE: History  Substance Use Topics  . Smoking status: Never Smoker   . Smokeless tobacco: Never Used  . Alcohol Use: No     Mammogram: 06/22/2013 Colonoscopy: Bone Density Scan:  Pap Smear:  Eye Exam:  Vitamin D Level:   Lipid Panel:    Allergies  Allergen Reactions  . Doxycycline Nausea And Vomiting    Current Outpatient Prescriptions  Medication Sig Dispense Refill  . cyclobenzaprine (FLEXERIL) 5 MG tablet TAKE 1 TABLET BY MOUTH THREE TIMES A DAY FOR MUSCLE SPASMS 30 tablet 1  . fentaNYL (DURAGESIC - DOSED MCG/HR) 25 MCG/HR patch Place 1 patch (25 mcg total) onto the skin every 3 (three) days. 10 patch 0  . fentaNYL (DURAGESIC - DOSED MCG/HR) 75 MCG/HR Place 1 patch (75 mcg total) onto the skin every 3 (three) days. 10 patch 0  . gabapentin (NEURONTIN) 300 MG capsule Take 1 capsule (300 mg total) by mouth 3 (three) times daily. 90 capsule 4  . loratadine (CLARITIN) 10 MG tablet Take 10 mg by mouth daily as needed for allergies.    Marland Kitchen LORazepam (ATIVAN) 0.5 MG tablet  Take 0.5 mg by mouth every 6 (six) hours as needed for anxiety.    Marland Kitchen oxyCODONE (OXY IR/ROXICODONE) 5 MG immediate release tablet Take 1 tablet (5 mg total) by mouth every 8 (eight) hours as needed for severe pain or breakthrough pain. 90 tablet 0  . potassium chloride SA (K-DUR,KLOR-CON) 20 MEQ tablet Take 1 tablet (20 mEq total) by mouth 4 (four) times daily. 120 tablet 1  . PRESCRIPTION MEDICATION PT REPORTS RECEIVING A SHOT ONCE A MONTH AT CANCER CENTER TO STOP HER MENSTRUAL PERIODS - I SEE DOCUMENTATION IN OFFICE NOTES  FROM CANCER CENTER THAT PT WAS GETTING ZOLADEX SQ EVERY 28 DAYS.    Marland Kitchen traMADol (ULTRAM) 50 MG tablet Take 1-2 tablets (50-100 mg total) by mouth every 6 (six) hours as needed. 30 tablet 1  . UNABLE TO FIND Rx: L8000- Post Surgical Bra (Quantity: 6) W4097- Non Silicone Breast prosthesis (Quantity: 1) Dx: 174.9; Left mastectomy 1 each 0  . zolpidem (AMBIEN) 5 MG tablet Take 1 tablet (5 mg total) by mouth at bedtime as needed for sleep. 30 tablet 0  . HYDROcodone-homatropine (HYCODAN) 5-1.5 MG/5ML syrup Take 5 mLs by mouth every 6 (six) hours as needed for cough. (Patient not taking: Reported on 01/07/2015) 240 mL 0  . ibuprofen (ADVIL,MOTRIN) 200 MG tablet Take 600-800 mg by mouth every 4 (four) hours as needed for mild pain or moderate pain.      No current facility-administered medications for this visit.   Facility-Administered Medications Ordered in Other Visits  Medication Dose Route Frequency Provider Last Rate Last Dose  . topical emolient (BIAFINE) emulsion   Topical Daily [name], MD        OBJECTIVE: middle-aged African American woman in no acute distress Filed Vitals:   01/07/15 1118  BP:   Pulse: 126  Temp:   Resp:      Body mass index is 27.62 kg/(m^2).      ECOG FS:1 - Symptomatic but completely ambulatory   Skin: warm, dry  HEENT: sclerae anicteric, conjunctivae pink, grade 1 mucositis to tongue Lymph Nodes: No cervical or supraclavicular  lymphadenopathy  Lungs: clear to auscultation bilaterally, no rales, wheezes, or rhonci  Heart: regular rate and rhythm  Abdomen: round, soft, non tender, positive bowel sounds  Musculoskeletal: No focal spinal tenderness, no peripheral edema  Neuro: non focal, well oriented, positive affect  Breast: deferred  LAB RESULTS:  CMP     Component Value Date/Time   NA 139 01/07/2015 1056   NA 138 11/16/2013 0448   K 3.8 01/07/2015 1056   K 3.5 11/16/2013 0448   CL 103 11/16/2013 0448   CL 102 06/01/2013 1500   CO2 25 01/07/2015 1056   CO2 27 11/16/2013 0448   GLUCOSE 101 01/07/2015 1056   GLUCOSE 116* 11/16/2013 0448   GLUCOSE 95 06/01/2013 1500   BUN 9.9 01/07/2015 1056   BUN 8 11/16/2013 0448   CREATININE 0.7 01/07/2015 1056   CREATININE 0.86 11/16/2013 0448   CALCIUM 9.6 01/07/2015 1056   CALCIUM 8.9 11/16/2013 0448   PROT 8.2 01/07/2015 1056   PROT 8.2 05/18/2013 1205   ALBUMIN 2.9* 01/07/2015 1056   ALBUMIN 2.8* 05/18/2013 1205   AST 18 01/07/2015 1056   AST 18 05/18/2013 1205   ALT 11 01/07/2015 1056   ALT 16 05/18/2013 1205   ALKPHOS 124 01/07/2015 1056   ALKPHOS 111 05/18/2013 1205   BILITOT 0.98 01/07/2015 1056   BILITOT 0.2* 05/18/2013 1205   GFRNONAA 82* 11/16/2013 0448   GFRAA >90 11/16/2013 0448    I No results found for: SPEP  Lab Results  Component Value Date   WBC 3.2* 01/07/2015   NEUTROABS 2.7 01/07/2015   HGB 9.1* 01/07/2015   HCT 29.6* 01/07/2015   MCV 79.6 01/07/2015   PLT 228 01/07/2015      Chemistry      Component Value Date/Time   NA 139 01/07/2015 1056   NA 138 11/16/2013 0448   K 3.8 01/07/2015 1056   K 3.5 11/16/2013 0448   CL 103 11/16/2013 0448  CL 102 06/01/2013 1500   CO2 25 01/07/2015 1056   CO2 27 11/16/2013 0448   BUN 9.9 01/07/2015 1056   BUN 8 11/16/2013 0448   CREATININE 0.7 01/07/2015 1056   CREATININE 0.86 11/16/2013 0448      Component Value Date/Time   CALCIUM 9.6 01/07/2015 1056   CALCIUM 8.9 11/16/2013  0448   ALKPHOS 124 01/07/2015 1056   ALKPHOS 111 05/18/2013 1205   AST 18 01/07/2015 1056   AST 18 05/18/2013 1205   ALT 11 01/07/2015 1056   ALT 16 05/18/2013 1205   BILITOT 0.98 01/07/2015 1056   BILITOT 0.2* 05/18/2013 1205       Lab Results  Component Value Date   LABCA2 35 05/18/2013    No components found for: [id number]  No results for input(s): INR in the last 168 hours.  Urinalysis No results found for: COLORURINE  STUDIES: No results found.  ASSESSMENT: 44 y.o.  BRCA negative [place] woman with  triple negative metastatic breast cancer.  (0) genetic testing of the ATM, BARD1, BRCA1, BRCA2, BRIP1, CDH1, CHEK2, EPCAM, FANCC, MLH1, MSH2, MSH6, NBN, PALB2, PMS2, PTEN, RAD51C, RAD51D, STK11, TP53, and XRCC2 genes showed no deleterious mutations  (1) status post left breast biopsy 04/23/2013 for a clinical T4 N1, stage IIIB invasive ductal carcinoma, grade 3,  estrogen receptor 14% positive with moderate staining intensity, progesterone receptor negative,  with an MIB-1 of  100% and HER-2/neu  nonamplified  (2) biopsy of a left axillary lymph node [id number] was positive  (3) started monthly goserelin July of 02/01/2013  (4)  received  neoadjuvant chemotherapy with doxorubicin and cyclophosphamide in dose dense fashion x4, followed by carboplatin and paclitaxel x1, discontinued due to neuropathy, followed by carboplatin and gemcitabine, both given day 1 of each 14 day cycle x6, completed 10/12/2013 .   (5) right upper extremity DVT documented 06/29/2013,  treated with Lovenox from  July  through  December 2014    (6) status post left modified radical mastectomy 11/15/2013 for a residual pT3 pN0 invasive ductal carcinoma, grade 3, with repeat prognostic panel triple negative. All 12 axillary lymph nodes were clear. Margins were negative   (5) completed adjuvant radiation therapy 03/18/2014,  with capecitabine added 2-3 weeks into treatment.  METASTATIC DISEASE  (6) CT  scans of the chest, abdomen, and pelvis 06/26/2014 showed a large right pleural effusion  with pleural studding, 2 left upper lobe nodules, and two  liver lesions. Cytology from right thoracentesis 07/03/2014 showed malignant cells consistent with the patient's known primary  (7) started eribulin 07/09/2014, given days 1 and 8 of each 21 day cycle; restagind studies after cycle 3 showed evidence of response. Discontinued after cycle 5 (last dose 10/08/14) because of worsening peripheral neuropathy--also with progression.   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 The labs were reviewed in detail and her platelet count is back down to normal. She never did start the baby aspirin daily, and at this time does not need to.   For her mucositis, I have sent a prescription for magic mouthwash with lidocaine to her pharmacy. She will use it four times daily PRN, swish and spit.   Kanesha will return in 2 weeks for cycle 4 of CMF. After that she will have repeat scans performed. She understands  and agrees with this plan. She knows the goal of treatment in her case is control. She has been encouraged to call with any issues that might arise before her next visit here.   Marcelino Duster, [place] [phone] 01/07/2015 2:35 PM

## 2015-01-10 ENCOUNTER — Ambulatory Visit: Payer: PRIVATE HEALTH INSURANCE

## 2015-01-15 ENCOUNTER — Telehealth: Payer: Self-pay

## 2015-01-15 ENCOUNTER — Ambulatory Visit (HOSPITAL_BASED_OUTPATIENT_CLINIC_OR_DEPARTMENT_OTHER): Payer: PRIVATE HEALTH INSURANCE | Admitting: Nurse Practitioner

## 2015-01-15 ENCOUNTER — Encounter: Payer: Self-pay | Admitting: Nurse Practitioner

## 2015-01-15 VITALS — Temp 98.4°F | Resp 20 | Wt 168.5 lb

## 2015-01-15 DIAGNOSIS — G893 Neoplasm related pain (acute) (chronic): Secondary | ICD-10-CM

## 2015-01-15 DIAGNOSIS — C50212 Malignant neoplasm of upper-inner quadrant of left female breast: Secondary | ICD-10-CM

## 2015-01-15 DIAGNOSIS — E876 Hypokalemia: Secondary | ICD-10-CM

## 2015-01-15 DIAGNOSIS — J069 Acute upper respiratory infection, unspecified: Secondary | ICD-10-CM | POA: Insufficient documentation

## 2015-01-15 MED ORDER — HYDROCODONE-HOMATROPINE 5-1.5 MG/5ML PO SYRP: 5.0000 mL | ORAL_SOLUTION | Freq: Four times a day (QID) | ORAL | Status: DC | PRN

## 2015-01-15 MED ORDER — POTASSIUM CHLORIDE CRYS ER 20 MEQ PO TBCR: 20.0000 meq | EXTENDED_RELEASE_TABLET | Freq: Four times a day (QID) | ORAL | Status: DC

## 2015-01-15 MED ORDER — OXYCODONE HCL 5 MG PO TABS: 5.0000 mg | ORAL_TABLET | Freq: Three times a day (TID) | ORAL | Status: DC | PRN

## 2015-01-15 MED ORDER — FENTANYL 75 MCG/HR TD PT72: 75.0000 ug | MEDICATED_PATCH | TRANSDERMAL | Status: DC

## 2015-01-15 MED ORDER — FENTANYL 25 MCG/HR TD PT72: 25.0000 ug | MEDICATED_PATCH | TRANSDERMAL | Status: DC

## 2015-01-15 NOTE — Telephone Encounter (Signed)
Called pt back: RX ready for pick up.

## 2015-01-15 NOTE — Telephone Encounter (Signed)
Pt called for med refill, fentanyl, oxycodone and potassium. Rx prepared for signature.

## 2015-01-15 NOTE — Assessment & Plan Note (Signed)
Patient complaining of cold symptoms for the past several days.  She is complaining specifically of nasal congestion, sinus drainage, and a dry cough.  She denies any recent fevers or chills.  Patient states that she has been taking Claritin over-the-counter only.  Patient is requesting a refill of her Hycodan syrup today.  On exam-patient is noted to have some moderate nasal congestion.  Oropharynx clear with no erythema or exudate.  No cough on exam.  Breath sounds clear bilaterally.  Vital signs stable.  Patient was advised to try over-the-counter mucin next and an over-the-counter decongestant when needed.  Patient was also given a refill of her Hycodan syrup per her request.  Patient was advised to call/return or go directly to the emergency department if she develops any worsening symptoms whatsoever.

## 2015-01-15 NOTE — Assessment & Plan Note (Signed)
Patient completed cycle 3 of her cyclophosphamide/methotrexate/5-FU chemotherapy on 12/31/2014.  She is scheduled to receive cycle 4 of the same regimen on 01/21/2015.

## 2015-01-15 NOTE — Assessment & Plan Note (Signed)
Patient does suffer with chronic generalized pain.  She obtained a refill of both her feet no patches and her oxycodone for breakthrough pain earlier today per another provider here at the cancer center.

## 2015-01-15 NOTE — Telephone Encounter (Signed)
Pt called asking for cough syrup. C/o a cold and cough. She wanted hycodan cough syrup. Set up appt w/[facility] today. Pt will be here in about 20 minutes.

## 2015-01-15 NOTE — Progress Notes (Signed)
SYMPTOM MANAGEMENT CLINIC   HPI: Erika [name] 44 y.o. female diagnosed with breast cancer.  Patient is status post left mastectomy in December 2014; and completed radiation therapy in April 2015.  Currently undergoing cyclophosphamide/methotrexate/5- FU chemotherapy regimen.  Patient presented as a walk-in this afternoon with complaint of a few day history of URI.  She is complaining of nasal congestion, and a dry cough.  She denies any sore throat, ear, or chills.  Also denies any GI symptoms whatsoever.  Patient has been taking Claritin over-the-counter only.    Patient states that she came to the [facility] today to pick up with her fentanyl and her oxycodone prescriptions that were filled per another provider earlier today.  Patient is also requesting a refill of her Hycodan cough syrup today.  HPI  CURRENT THERAPY: Upcoming Treatment Dates - BREAST Adjuvant CMF IV q21d Days with orders from any treatment category:  01/21/2015      SCHEDULING COMMUNICATION      ondansetron (ZOFRAN) IVPB 8 mg      dexamethasone (DECADRON) injection 10 mg      cyclophosphamide (CYTOXAN) 1,180 mg in sodium chloride 0.9 % 250 mL chemo infusion      methotrexate chemo injection 80 mg      fluorouracil (ADRUCIL) chemo injection 1,200 mg      sodium chloride 0.9 % injection 10 mL      heparin lock flush 100 unit/mL      heparin lock flush 100 unit/mL      alteplase (CATHFLO ACTIVASE) injection 2 mg      sodium chloride 0.9 % injection 3 mL      Cold Pack 1 packet      0.9 %  sodium chloride infusion      TREATMENT CONDITIONS 02/11/2015      SCHEDULING COMMUNICATION      ondansetron (ZOFRAN) IVPB 8 mg      dexamethasone (DECADRON) injection 10 mg      cyclophosphamide (CYTOXAN) 1,180 mg in sodium chloride 0.9 % 250 mL chemo infusion      methotrexate chemo injection 80 mg      fluorouracil (ADRUCIL) chemo injection 1,200 mg      sodium chloride 0.9 % injection 10 mL      heparin lock flush 100  unit/mL      heparin lock flush 100 unit/mL      alteplase (CATHFLO ACTIVASE) injection 2 mg      sodium chloride 0.9 % injection 3 mL      Cold Pack 1 packet      0.9 %  sodium chloride infusion      TREATMENT CONDITIONS 03/04/2015      SCHEDULING COMMUNICATION      ondansetron (ZOFRAN) IVPB 8 mg      dexamethasone (DECADRON) injection 10 mg      cyclophosphamide (CYTOXAN) 1,180 mg in sodium chloride 0.9 % 250 mL chemo infusion      methotrexate chemo injection 80 mg      fluorouracil (ADRUCIL) chemo injection 1,200 mg      sodium chloride 0.9 % injection 10 mL      heparin lock flush 100 unit/mL      heparin lock flush 100 unit/mL      alteplase (CATHFLO ACTIVASE) injection 2 mg      sodium chloride 0.9 % injection 3 mL      Cold Pack 1 packet      0.9 %  sodium chloride infusion  TREATMENT CONDITIONS    ROS  Past Medical History  Diagnosis Date  . Breast cancer     Invasive High Grade Carcinoma  . Headache(784.0)     Hx: of Migraines  . Status post chemotherapy     Adriamycin and Cytoxan  . Anxiety     ANXIOUS ABOUT HAVEING BIG SURGERY - MASTECTOMY  . 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 DVT (deep venous thrombosis)     RT ARM-PT ON LOVENOX  . Hx of iron deficiency anemia 11/26/13  . S/P radiation therapy 02-04-14 to 03-18-14                               1) Left Chest Wall / 50 Gy in 25 fractions/ 2) Left Supraclavicular fossa/ 46 Gy in 23 fractions / 3) Left Posterior Axillary boost / 8.533 Gy in 23 fractions/ 4) Left Chest Wall Scar boost / 10 Gy in 5 fractions    Past Surgical History  Procedure Laterality Date  . Tubal ligation    . Portacath placement Right 05/22/2013    Procedure: INSERTION PORT-A-CATH;  Surgeon: [name], MD;  Location: [facility];  Service: General;  Laterality: Right;  . Left breast needle core biopsy Left 05/21/13  . Left breast needle core  biopsy Left 06/22/13  . 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    is allergic to doxycycline.    Medication List       This list is accurate as of: 01/15/15  5:55 PM.  Always use your most recent med list.               cyclobenzaprine 5 MG tablet  Commonly known as:  FLEXERIL  TAKE 1 TABLET BY MOUTH THREE TIMES A DAY FOR MUSCLE SPASMS     fentaNYL 25 MCG/HR patch  Commonly known as:  DURAGESIC - dosed mcg/hr  Place 1 patch (25 mcg total) onto the skin every 3 (three) days.     fentaNYL 75 MCG/HR  Commonly known as:  DURAGESIC - dosed mcg/hr  Place 1 patch (75 mcg total) onto the skin every 3 (three) days.     gabapentin 300 MG capsule  Commonly known as:  NEURONTIN  Take 1 capsule (300 mg total) by mouth 3 (three) times daily.     HYDROcodone-homatropine 5-1.5 MG/5ML syrup  Commonly known as:  HYCODAN  Take 5 mLs by mouth every 6 (six) hours as needed for cough.     ibuprofen 200 MG tablet  Commonly known as:  ADVIL,MOTRIN  Take 600-800 mg by mouth every 4 (four) hours as needed for mild pain or moderate pain.     loratadine 10 MG tablet  Commonly known as:  CLARITIN  Take 10 mg by mouth daily as needed for allergies.     LORazepam 0.5 MG tablet  Commonly known as:  ATIVAN  Take 0.5 mg by mouth every 6 (six) hours as needed for anxiety.  oxyCODONE 5 MG immediate release tablet  Commonly known as:  Oxy IR/ROXICODONE  Take 1 tablet (5 mg total) by mouth every 8 (eight) hours as needed for severe pain or breakthrough pain.       potassium chloride SA 20 MEQ tablet  Commonly known as:  K-DUR,KLOR-CON  Take 1 tablet (20 mEq total) by mouth 4 (four) times daily.     PRESCRIPTION MEDICATION  PT REPORTS RECEIVING A SHOT ONCE A MONTH AT CANCER CENTER TO STOP HER MENSTRUAL PERIODS - I SEE DOCUMENTATION IN OFFICE NOTES FROM CANCER CENTER THAT PT WAS GETTING ZOLADEX SQ EVERY 28 DAYS.     traMADol 50 MG tablet  Commonly known as:  ULTRAM  Take 1-2 tablets (50-100 mg total) by mouth every 6 (six) hours as needed.     UNABLE TO FIND  - Rx: L8000- Post Surgical Bra (Quantity: 6)  - R7408- Non Silicone Breast prosthesis (Quantity: 1)  - Dx: 174.9; Left mastectomy     zolpidem 5 MG tablet  Commonly known as:  AMBIEN  Take 1 tablet (5 mg total) by mouth at bedtime as needed for sleep.         PHYSICAL EXAMINATION  Temperature 98.4 F (36.9 C), temperature source Oral, resp. rate 20, weight 168 lb 8 oz (76.431 kg), last menstrual period 05/30/2013, SpO2 100 %.  Physical Exam  Constitutional: She is oriented to person, place, and time and well-developed, well-nourished, and in no distress.  HENT:  Head: Normocephalic and atraumatic.  Mouth/Throat: Oropharynx is clear and moist.  Nasal congestion on exam.  Oropharynx clear with no erythema or exudate.  Eyes: Conjunctivae and EOM are normal. Pupils are equal, round, and reactive to light. Right eye exhibits no discharge. Left eye exhibits no discharge. No scleral icterus.  Neck: Normal range of motion. Neck supple. No JVD present. No tracheal deviation present. No thyromegaly present.  Cardiovascular: Normal rate, regular rhythm, normal heart sounds and intact distal pulses.   Pulmonary/Chest: Effort normal and breath sounds normal. No respiratory distress. She has no wheezes. She has no rales. She exhibits no tenderness.  Abdominal: Soft. Bowel sounds are normal. She exhibits no distension and no mass. There is no tenderness. There is no rebound and no guarding.   Musculoskeletal: Normal range of motion. She exhibits no edema or tenderness.  Lymphadenopathy:    She has no cervical adenopathy.  Neurological: She is alert and oriented to person, place, and time. Gait normal.  Skin: Skin is warm and dry. No rash noted. No erythema.  Psychiatric: Affect normal.  Nursing note and vitals reviewed.   LABORATORY DATA:. No visits with results within 3 Day(s) from this visit. Latest known visit with results is:  Appointment on 01/07/2015  Component Date Value Ref Range Status  . WBC 01/07/2015 3.2* 3.9 - 10.3 10e3/uL Final  . NEUT# 01/07/2015 2.7  1.5 - 6.5 10e3/uL Final  . HGB 01/07/2015 9.1* 11.6 - 15.9 g/dL Final  . HCT 01/07/2015 29.6* 34.8 - 46.6 % Final  . Platelets 01/07/2015 228  145 - 400 10e3/uL Final  . MCV 01/07/2015 79.6  79.5 - 101.0 fL Final  . MCH 01/07/2015 24.5* 25.1 - 34.0 pg Final  . MCHC 01/07/2015 30.7* 31.5 - 36.0 g/dL Final  . RBC 01/07/2015 3.72  3.70 - 5.45 10e6/uL Final  . RDW 01/07/2015 20.9* 11.2 - 14.5 % Final  . lymph# 01/07/2015 0.5* 0.9 - 3.3 10e3/uL Final  . MONO# 01/07/2015 0.0* 0.1 - 0.9 10e3/uL  Final  . Eosinophils Absolute 01/07/2015 0.0  0.0 - 0.5 10e3/uL Final  . Basophils Absolute 01/07/2015 0.0  0.0 - 0.1 10e3/uL Final  . NEUT% 01/07/2015 84.8* 38.4 - 76.8 % Final  . LYMPH% 01/07/2015 14.3  14.0 - 49.7 % Final  . MONO% 01/07/2015 0.6  0.0 - 14.0 % Final  . EOS% 01/07/2015 0.3  0.0 - 7.0 % Final  . BASO% 01/07/2015 0.0  0.0 - 2.0 % Final  . Sodium 01/07/2015 139  136 - 145 mEq/L Final  . Potassium 01/07/2015 3.8  3.5 - 5.1 mEq/L Final  . Chloride 01/07/2015 101  98 - 109 mEq/L Final  . CO2 01/07/2015 25  22 - 29 mEq/L Final  . Glucose 01/07/2015 101  70 - 140 mg/dl Final  . BUN 01/07/2015 9.9  7.0 - 26.0 mg/dL Final  . Creatinine 01/07/2015 0.7  0.6 - 1.1 mg/dL Final  . Total Bilirubin 01/07/2015 0.98  0.20 - 1.20 mg/dL Final  . Alkaline Phosphatase 01/07/2015 124  40 - 150 U/L Final  . AST 01/07/2015  18  5 - 34 U/L Final  . ALT 01/07/2015 11  0 - 55 U/L Final  . Total Protein 01/07/2015 8.2  6.4 - 8.3 g/dL Final  . Albumin 01/07/2015 2.9* 3.5 - 5.0 g/dL Final  . Calcium 01/07/2015 9.6  8.4 - 10.4 mg/dL Final  . Anion Gap 01/07/2015 13* 3 - 11 mEq/L Final  . EGFR 01/07/2015 >90  >90 ml/min/1.73 m2 Final   eGFR is calculated using the CKD-EPI Creatinine Equation (2009)     RADIOGRAPHIC STUDIES: No results found.  ASSESSMENT/PLAN:    URI (upper respiratory infection) Patient complaining of cold symptoms for the past several days.  She is complaining specifically of nasal congestion, sinus drainage, and a dry cough.  She denies any recent fevers or chills.  Patient states that she has been taking Claritin over-the-counter only.  Patient is requesting a refill of her Hycodan syrup today.  On exam-patient is noted to have some moderate nasal congestion.  Oropharynx clear with no erythema or exudate.  No cough on exam.  Breath sounds clear bilaterally.  Vital signs stable.  Patient was advised to try over-the-counter mucin next and an over-the-counter decongestant when needed.  Patient was also given a refill of her Hycodan syrup per her request.  Patient was advised to call/return or go directly to the emergency department if she develops any worsening symptoms whatsoever.   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  She obtained a refill of both her feet no patches and her oxycodone for breakthrough pain earlier today per another provider here at the cancer center.   Breast cancer of upper-inner quadrant of left female breast Patient completed cycle 3 of her cyclophosphamide/methotrexate/5-FU chemotherapy on 12/31/2014.  She is scheduled to receive cycle 4 of the same regimen on 01/21/2015.   Patient stated understanding of all instructions; and was in agreement with this plan of care. The patient knows to call the clinic with any problems, questions or  concerns.   Review/collaboration with Dr. Jana Hakim regarding all aspects of patient's visit today.   Total time spent with patient was 15 minutes;  with greater than 75 percent of that time spent in face to face counseling regarding patient's symptoms,  and coordination of care and follow up.  Disclaimer: This note was dictated with voice recognition software. Similar sounding words can inadvertently be transcribed and may not be corrected upon review.  Drue Second, NP 01/15/2015

## 2015-01-16 ENCOUNTER — Telehealth: Payer: Self-pay

## 2015-01-21 ENCOUNTER — Ambulatory Visit: Payer: PRIVATE HEALTH INSURANCE | Admitting: Nutrition

## 2015-01-21 ENCOUNTER — Encounter: Payer: Self-pay | Admitting: Nurse Practitioner

## 2015-01-21 ENCOUNTER — Other Ambulatory Visit: Payer: Self-pay | Admitting: Oncology

## 2015-01-21 ENCOUNTER — Ambulatory Visit (HOSPITAL_BASED_OUTPATIENT_CLINIC_OR_DEPARTMENT_OTHER): Payer: PRIVATE HEALTH INSURANCE | Admitting: Nurse Practitioner

## 2015-01-21 ENCOUNTER — Other Ambulatory Visit (HOSPITAL_BASED_OUTPATIENT_CLINIC_OR_DEPARTMENT_OTHER): Payer: PRIVATE HEALTH INSURANCE

## 2015-01-21 ENCOUNTER — Ambulatory Visit (HOSPITAL_BASED_OUTPATIENT_CLINIC_OR_DEPARTMENT_OTHER): Payer: PRIVATE HEALTH INSURANCE

## 2015-01-21 ENCOUNTER — Other Ambulatory Visit: Payer: Self-pay | Admitting: *Deleted

## 2015-01-21 DIAGNOSIS — C773 Secondary and unspecified malignant neoplasm of axilla and upper limb lymph nodes: Secondary | ICD-10-CM

## 2015-01-21 DIAGNOSIS — E876 Hypokalemia: Secondary | ICD-10-CM

## 2015-01-21 DIAGNOSIS — Z5111 Encounter for antineoplastic chemotherapy: Secondary | ICD-10-CM

## 2015-01-21 DIAGNOSIS — C50212 Malignant neoplasm of upper-inner quadrant of left female breast: Secondary | ICD-10-CM

## 2015-01-21 DIAGNOSIS — Z86718 Personal history of other venous thrombosis and embolism: Secondary | ICD-10-CM

## 2015-01-21 DIAGNOSIS — T451X5A Adverse effect of antineoplastic and immunosuppressive drugs, initial encounter: Secondary | ICD-10-CM

## 2015-01-21 DIAGNOSIS — G893 Neoplasm related pain (acute) (chronic): Secondary | ICD-10-CM

## 2015-01-21 DIAGNOSIS — G620 Drug-induced polyneuropathy: Secondary | ICD-10-CM

## 2015-01-21 DIAGNOSIS — C50912 Malignant neoplasm of unspecified site of left female breast: Secondary | ICD-10-CM

## 2015-01-21 DIAGNOSIS — D6481 Anemia due to antineoplastic chemotherapy: Secondary | ICD-10-CM

## 2015-01-21 DIAGNOSIS — J910 Malignant pleural effusion: Secondary | ICD-10-CM

## 2015-01-21 LAB — COMPREHENSIVE METABOLIC PANEL (CC13)
ALBUMIN: 2.5 g/dL — AB (ref 3.5–5.0)
ALT: 10 U/L (ref 0–55)
AST: 19 U/L (ref 5–34)
Alkaline Phosphatase: 149 U/L (ref 40–150)
Anion Gap: 11 mEq/L (ref 3–11)
BUN: 4.3 mg/dL — ABNORMAL LOW (ref 7.0–26.0)
CALCIUM: 9.8 mg/dL (ref 8.4–10.4)
CHLORIDE: 105 meq/L (ref 98–109)
CO2: 26 mEq/L (ref 22–29)
Creatinine: 0.7 mg/dL (ref 0.6–1.1)
Glucose: 90 mg/dl (ref 70–140)
Potassium: 4.4 mEq/L (ref 3.5–5.1)
SODIUM: 141 meq/L (ref 136–145)
Total Bilirubin: 0.23 mg/dL (ref 0.20–1.20)
Total Protein: 7.4 g/dL (ref 6.4–8.3)

## 2015-01-21 LAB — CBC WITH DIFFERENTIAL/PLATELET
BASO%: 0.2 % (ref 0.0–2.0)
Basophils Absolute: 0 10*3/uL (ref 0.0–0.1)
EOS%: 2.1 % (ref 0.0–7.0)
Eosinophils Absolute: 0.1 10*3/uL (ref 0.0–0.5)
HEMATOCRIT: 27.4 % — AB (ref 34.8–46.6)
HGB: 8.4 g/dL — ABNORMAL LOW (ref 11.6–15.9)
LYMPH%: 17 % (ref 14.0–49.7)
MCH: 25 pg — AB (ref 25.1–34.0)
MCHC: 30.7 g/dL — ABNORMAL LOW (ref 31.5–36.0)
MCV: 81.5 fL (ref 79.5–101.0)
MONO#: 0.6 10*3/uL (ref 0.1–0.9)
MONO%: 9 % (ref 0.0–14.0)
NEUT#: 4.5 10*3/uL (ref 1.5–6.5)
NEUT%: 71.7 % (ref 38.4–76.8)
Platelets: 683 10*3/uL — ABNORMAL HIGH (ref 145–400)
RBC: 3.36 10*6/uL — AB (ref 3.70–5.45)
RDW: 22.9 % — AB (ref 11.2–14.5)
WBC: 6.3 10*3/uL (ref 3.9–10.3)
lymph#: 1.1 10*3/uL (ref 0.9–3.3)

## 2015-01-21 MED ORDER — SODIUM CHLORIDE 0.9 % IJ SOLN
10.0000 mL | INTRAMUSCULAR | Status: DC | PRN
  Administered 2015-01-21: 10 mL
  Filled 2015-01-21: qty 10

## 2015-01-21 MED ORDER — HEPARIN SOD (PORK) LOCK FLUSH 100 UNIT/ML IV SOLN
500.0000 [IU] | Freq: Once | INTRAVENOUS | Status: AC | PRN
  Administered 2015-01-21: 500 [IU]
  Filled 2015-01-21: qty 5

## 2015-01-21 MED ORDER — FLUOROURACIL CHEMO INJECTION 2.5 GM/50ML
600.0000 mg/m2 | Freq: Once | INTRAVENOUS | Status: AC
  Administered 2015-01-21: 1200 mg via INTRAVENOUS
  Filled 2015-01-21: qty 24

## 2015-01-21 MED ORDER — SODIUM CHLORIDE 0.9 % IV SOLN
Freq: Once | INTRAVENOUS | Status: AC
  Administered 2015-01-21: 13:00:00 via INTRAVENOUS

## 2015-01-21 MED ORDER — ONDANSETRON 8 MG/NS 50 ML IVPB
INTRAVENOUS | Status: AC
  Filled 2015-01-21: qty 8

## 2015-01-21 MED ORDER — METHOTREXATE SODIUM CHEMO INJECTION 25 MG/ML
40.0000 mg/m2 | Freq: Once | INTRAMUSCULAR | Status: AC
  Administered 2015-01-21: 80 mg via INTRAVENOUS
  Filled 2015-01-21: qty 3.2

## 2015-01-21 MED ORDER — DEXAMETHASONE SODIUM PHOSPHATE 10 MG/ML IJ SOLN
10.0000 mg | Freq: Once | INTRAMUSCULAR | Status: AC
  Administered 2015-01-21: 10 mg via INTRAVENOUS

## 2015-01-21 MED ORDER — ONDANSETRON 8 MG/50ML IVPB (CHCC)
8.0000 mg | Freq: Once | INTRAVENOUS | Status: AC
  Administered 2015-01-21: 8 mg via INTRAVENOUS

## 2015-01-21 MED ORDER — DEXAMETHASONE SODIUM PHOSPHATE 10 MG/ML IJ SOLN
INTRAMUSCULAR | Status: AC
  Filled 2015-01-21: qty 1

## 2015-01-21 MED ORDER — SODIUM CHLORIDE 0.9 % IV SOLN
600.0000 mg/m2 | Freq: Once | INTRAVENOUS | Status: AC
  Administered 2015-01-21: 1180 mg via INTRAVENOUS
  Filled 2015-01-21: qty 59

## 2015-01-21 MED ORDER — LORAZEPAM 0.5 MG PO TABS: 0.5000 mg | ORAL_TABLET | Freq: Four times a day (QID) | ORAL | Status: DC | PRN

## 2015-01-21 NOTE — Progress Notes (Signed)
Nutrition follow-up completed with patient during chemotherapy.  Patient is being treated for triple negative breast cancer.  Patient reports she is doing well. Nausea has improved. She continues to drink liquids well.  Oral intake has improved. Weight documented as 172.7 pounds February 9 which is increased from 171.6 pounds December 29.  Nutrition diagnosis: Unintended weight loss improved.  Intervention: Patient encouraged to continue to try to consume high-calorie high-protein foods frequently throughout the day. Patient to continue Ensure Plus or boost +3 times a day between meals. Questions were answered.  Teach back method used.  Monitoring, evaluation, goals: Patient will continue to consume adequate calories and protein along with Ensure Plus to promote weight maintenance.  Next visit: To be scheduled as needed.  **Disclaimer: This note was dictated with voice recognition software. Similar sounding words can inadvertently be transcribed and this note may contain transcription errors which may not have been corrected upon publication of note.**

## 2015-01-21 NOTE — Progress Notes (Signed)
ID: Erika [name] OB: [birth year]  MR#: [id number]  CSN#:[id number]  PCP: [name], MD GYN:   SU: Dr. [name] OTHER MD:  CHIEF COMPLAINT:  Newly metastatic breast cancer CURRENT TREATMENT: [facility],  goserelin   BREAST CANCER HISTORY: From Dr. [name] original intake note:  "Patient developed a left sided breast mass show up about 6 months prior to diagnosis. 2 months prior this began getting larger rapidly. She was seen in an urgent care where this mass was thought to be an abscess. Apparently there was an attempt to excise or drain this that was not successful and she was referred to Dr. [name] for evaluation. She underwent punch biopsy on 04/23/13 of her skin and the mass. She was then also sent for mm/us. The biopsy shows invasive high grade carcinoma that appears to be breast. It is ER 14%, PR negative, HER-2/neu negative with a Ki-67 that approaches 100%. She has also undergone mm with at least 10 cm upper inner left breast tumor with mild overyling skin thickening. There is also a 3x10 mm cluster of calcs near there also. There is indeterminate 7 mm level 1 left axillary node, she underwent a lymph node biopsy that was positive for disease. No mm evidence of right breast malignancy. An MRI of the breasts was done on 05/22/13 and showed a 14 cm mass in the left breast protruding from the skin and also a 2.5 cm level II axillary node."  Her subsequent history is as detailed below  INTERVAL HISTORY:   Erika [name] returns today for follow up of her breast cancer. Today is day 1, cycle 4 of cyclophosphamide, methotrexate, and fluorouracil given every 21 days.   REVIEW OF SYSTEMS:  Erika [name] denies fevers, chills, or changes in bowel or bladder habits. She has had some mild nausea managed with herbal supplements. Her is eating better and has gained some weight. She is getting over some mild sinus symptoms. The numbness to her fingertips is unchanged. Her back and side pain is managed at a  5/10 with 145mg worth of fentanyl patches and oxycodone PRN.  She denies shortness of breath, chest pain, cough, or palpitations. She was greatly fatigued the day or two after chemo, but she feels improved afterwards. She sleeps off an on depending on the night. She needs a refill of ativan today.  A detailed review of systems is otherwise negative.   PAST MEDICAL HISTORY: Past Medical History  Diagnosis Date  . Breast cancer     Invasive High Grade Carcinoma  . Headache(784.0)     Hx: of Migraines  . Status post chemotherapy     Adriamycin and Cytoxan  . Anxiety     ANXIOUS ABOUT HAVEING BIG SURGERY - MASTECTOMY  . 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 DVT (deep venous thrombosis)     RT ARM-PT ON LOVENOX  . Hx of iron deficiency anemia 11/26/13  . S/P radiation therapy 02-04-14 to 03-18-14                               1) Left Chest Wall / 50 Gy in 25 fractions/ 2) Left Supraclavicular fossa/ 46 Gy in 23 fractions / 3) Left Posterior Axillary boost / 8.533 Gy in 23 fractions/ 4) Left Chest Wall Scar boost / 10 Gy in 5 fractions    PAST SURGICAL HISTORY: Past Surgical History  Procedure Laterality Date  . Tubal ligation    .  Portacath placement Right 05/22/2013    Procedure: INSERTION PORT-A-CATH;  Surgeon: [name], MD;  Location: [facility];  Service: General;  Laterality: Right;  . Left breast needle core biopsy Left 05/21/13  . Left breast needle core  biopsy Left 06/22/13  . Mastectomy modified radical Left 11/15/2013    Procedure: LEFT MASTECTOMY MODIFIED RADICAL;  Surgeon: [name], MD;  Location: [facility];  Service: General;  Laterality: Left;    FAMILY HISTORY Family History  Problem Relation Age of Onset  . Hypertension Mother   . Diabetes Father 46  . Stomach cancer Maternal Aunt   . Hypertension Maternal Grandmother   . Hypertension Maternal Grandfather   . Diabetes Paternal Grandmother   . Diabetes Paternal Grandfather   . Diabetes Paternal Uncle   .  Diabetes Paternal Uncle   . Diabetes Paternal Uncle   The patient's mother is alive, age 68. The patient's father died from complications of diabetes at the age of 43. The patient has 2 brothers, one sister. There is no history of breast or ovarian cancer in the family to her knowledge .the patient has not undergone genetic testing   GYNECOLOGIC HISTORY:  Menarche age 48, first live birth age 49. The patient is GX P3. The patient has been on goserelin since 06/13/2013, given monthly  SOCIAL HISTORY:  The patient has worked at as a Furniture conservator/restorer and a Retail buyer. She is currently applying for disability. At home she lives with her youngest son, Erika [name], 82; with the patient's brother Madelyn Tlatelpa; and with the patient's fiance [name].   ADVANCED DIRECTIVES: Not in place  HEALTH MAINTENANCE: History  Substance Use Topics  . Smoking status: Never Smoker   . Smokeless tobacco: Never Used  . Alcohol Use: No     Mammogram: 06/22/2013 Colonoscopy: Bone Density Scan:  Pap Smear:  Eye Exam:  Vitamin D Level:   Lipid Panel:    Allergies  Allergen Reactions  . Doxycycline Nausea And Vomiting    Current Outpatient Prescriptions  Medication Sig Dispense Refill  . cyclobenzaprine (FLEXERIL) 5 MG tablet TAKE 1 TABLET BY MOUTH THREE TIMES A DAY FOR MUSCLE SPASMS 30 tablet 1  . fentaNYL (DURAGESIC - DOSED MCG/HR) 25 MCG/HR patch Place 1 patch (25 mcg total) onto the skin every 3 (three) days. 10 patch 0  . fentaNYL (DURAGESIC - DOSED MCG/HR) 75 MCG/HR Place 1 patch (75 mcg total) onto the skin every 3 (three) days. 10 patch 0  . gabapentin (NEURONTIN) 300 MG capsule Take 1 capsule (300 mg total) by mouth 3 (three) times daily. 90 capsule 4  . HYDROcodone-homatropine (HYCODAN) 5-1.5 MG/5ML syrup Take 5 mLs by mouth every 6 (six) hours as needed for cough. 240 mL 0  . loratadine (CLARITIN) 10 MG tablet Take 10 mg by mouth daily as needed for allergies.    Marland Kitchen oxyCODONE (OXY IR/ROXICODONE) 5  MG immediate release tablet Take 1 tablet (5 mg total) by mouth every 8 (eight) hours as needed for severe pain or breakthrough pain. 90 tablet 0  . potassium chloride SA (K-DUR,KLOR-CON) 20 MEQ tablet Take 1 tablet (20 mEq total) by mouth 4 (four) times daily. 120 tablet 1  . PRESCRIPTION MEDICATION PT REPORTS RECEIVING A SHOT ONCE A MONTH AT CANCER CENTER TO STOP HER MENSTRUAL PERIODS - I SEE DOCUMENTATION IN OFFICE NOTES FROM CANCER CENTER THAT PT WAS GETTING ZOLADEX SQ EVERY 28 DAYS.    Marland Kitchen traMADol (ULTRAM) 50 MG tablet Take 1-2 tablets (50-100 mg total)  by mouth every 6 (six) hours as needed. 30 tablet 1  . UNABLE TO FIND Rx: L8000- Post Surgical Bra (Quantity: 6) Z2248- Non Silicone Breast prosthesis (Quantity: 1) Dx: 174.9; Left mastectomy 1 each 0  . zolpidem (AMBIEN) 5 MG tablet Take 1 tablet (5 mg total) by mouth at bedtime as needed for sleep. 30 tablet 0  . ibuprofen (ADVIL,MOTRIN) 200 MG tablet Take 600-800 mg by mouth every 4 (four) hours as needed for mild pain or moderate pain.     Marland Kitchen LORazepam (ATIVAN) 0.5 MG tablet Take 0.5 mg by mouth every 6 (six) hours as needed for anxiety.     No current facility-administered medications for this visit.   Facility-Administered Medications Ordered in Other Visits  Medication Dose Route Frequency Provider Last Rate Last Dose  . topical emolient (BIAFINE) emulsion   Topical Daily [name], MD        OBJECTIVE: middle-aged African American woman in no acute distress Filed Vitals:   01/21/15 1132  BP: 107/74  Pulse: 107  Temp: 98.4 F (36.9 C)  Resp: 18     Body mass index is 28.74 kg/(m^2).      ECOG FS:1 - Symptomatic but completely ambulatory   Sclerae unicteric, pupils equal and reactive Oropharynx clear and moist-- no thrush No cervical or supraclavicular adenopathy Lungs no rales or rhonchi Heart regular rate and rhythm Abd soft, nontender, positive bowel sounds MSK no focal spinal tenderness, no upper extremity  lymphedema Neuro: nonfocal, well oriented, appropriate affect Breasts: deferred  LAB RESULTS:  CMP     Component Value Date/Time   NA 139 01/07/2015 1056   NA 138 11/16/2013 0448   K 3.8 01/07/2015 1056   K 3.5 11/16/2013 0448   CL 103 11/16/2013 0448   CL 102 06/01/2013 1500   CO2 25 01/07/2015 1056   CO2 27 11/16/2013 0448   GLUCOSE 101 01/07/2015 1056   GLUCOSE 116* 11/16/2013 0448   GLUCOSE 95 06/01/2013 1500   BUN 9.9 01/07/2015 1056   BUN 8 11/16/2013 0448   CREATININE 0.7 01/07/2015 1056   CREATININE 0.86 11/16/2013 0448   CALCIUM 9.6 01/07/2015 1056   CALCIUM 8.9 11/16/2013 0448   PROT 8.2 01/07/2015 1056   PROT 8.2 05/18/2013 1205   ALBUMIN 2.9* 01/07/2015 1056   ALBUMIN 2.8* 05/18/2013 1205   AST 18 01/07/2015 1056   AST 18 05/18/2013 1205   ALT 11 01/07/2015 1056   ALT 16 05/18/2013 1205   ALKPHOS 124 01/07/2015 1056   ALKPHOS 111 05/18/2013 1205   BILITOT 0.98 01/07/2015 1056   BILITOT 0.2* 05/18/2013 1205   GFRNONAA 82* 11/16/2013 0448   GFRAA >90 11/16/2013 0448    I No results found for: SPEP  Lab Results  Component Value Date   WBC 6.3 01/21/2015   NEUTROABS 4.5 01/21/2015   HGB 8.4* 01/21/2015   HCT 27.4* 01/21/2015   MCV 81.5 01/21/2015   PLT 683* 01/21/2015      Chemistry      Component Value Date/Time   NA 139 01/07/2015 1056   NA 138 11/16/2013 0448   K 3.8 01/07/2015 1056   K 3.5 11/16/2013 0448   CL 103 11/16/2013 0448   CL 102 06/01/2013 1500   CO2 25 01/07/2015 1056   CO2 27 11/16/2013 0448   BUN 9.9 01/07/2015 1056   BUN 8 11/16/2013 0448   CREATININE 0.7 01/07/2015 1056   CREATININE 0.86 11/16/2013 0448      Component Value Date/Time  CALCIUM 9.6 01/07/2015 1056   CALCIUM 8.9 11/16/2013 0448   ALKPHOS 124 01/07/2015 1056   ALKPHOS 111 05/18/2013 1205   AST 18 01/07/2015 1056   AST 18 05/18/2013 1205   ALT 11 01/07/2015 1056   ALT 16 05/18/2013 1205   BILITOT 0.98 01/07/2015 1056   BILITOT 0.2* 05/18/2013 1205        Lab Results  Component Value Date   LABCA2 35 05/18/2013    No components found for: HFGBM211  No results for input(s): INR in the last 168 hours.  Urinalysis No results found for: COLORURINE  STUDIES: No results found.  ASSESSMENT: 44 y.o.  BRCA negative [place] woman with  triple negative metastatic breast cancer.  (0) genetic testing of the ATM, BARD1, BRCA1, BRCA2, BRIP1, CDH1, CHEK2, EPCAM, FANCC, MLH1, MSH2, MSH6, NBN, PALB2, PMS2, PTEN, RAD51C, RAD51D, STK11, TP53, and XRCC2 genes showed no deleterious mutations  (1) status post left breast biopsy 04/23/2013 for a clinical T4 N1, stage IIIB invasive ductal carcinoma, grade 3,  estrogen receptor 14% positive with moderate staining intensity, progesterone receptor negative,  with an MIB-1 of  100% and HER-2/neu  nonamplified  (2) biopsy of a left axillary lymph node [id number] was positive  (3) started monthly goserelin July of 02/01/2013  (4)  received  neoadjuvant chemotherapy with doxorubicin and cyclophosphamide in dose dense fashion x4, followed by carboplatin and paclitaxel x1, discontinued due to neuropathy, followed by carboplatin and gemcitabine, both given day 1 of each 14 day cycle x6, completed 10/12/2013 .   (5) right upper extremity DVT documented 06/29/2013,  treated with Lovenox from  July  through  December 2014    (6) status post left modified radical mastectomy 11/15/2013 for a residual pT3 pN0 invasive ductal carcinoma, grade 3, with repeat prognostic panel triple negative. All 12 axillary lymph nodes were clear. Margins were negative   (5) completed adjuvant radiation therapy 03/18/2014,  with capecitabine added 2-3 weeks into treatment.  METASTATIC DISEASE  (6) CT scans of the chest, abdomen, and pelvis 06/26/2014 showed a large right pleural effusion  with pleural studding, 2 left upper lobe nodules, and two  liver lesions. Cytology from right thoracentesis 07/03/2014 showed malignant cells  consistent with the patient's known primary  (7) started eribulin 07/09/2014, given days 1 and 8 of each 21 day cycle; restagind studies after cycle 3 showed evidence of response. Discontinued after cycle 5 (last dose 10/08/14) because of worsening peripheral neuropathy--also with progression.   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 The labs were reviewed in detail and were relatively stable. She has some treatment related anemia with an HGB of 8.4. She is asymptomatic at this time and we will continue to monitor this value.   Erika [name] will return next week for labs and a nadir visit.  After that she will have repeat scans performed. She understands and agrees with this plan. She knows the goal of treatment in her case is control. She has been encouraged to call with any issues that might arise before her next visit here.   Erika [name], [place] [phone] 01/21/2015 12:01 PM

## 2015-01-21 NOTE — Patient Instructions (Signed)
[facility] Discharge Instructions for Patients Receiving Chemotherapy  Today you received the following chemotherapy agents Cytoxan, Methatrexate and 5FU  To help prevent nausea and vomiting after your treatment, we encourage you to take your nausea medication as prescribed.   If you develop nausea and vomiting that is not controlled by your nausea medication, call the clinic.   BELOW ARE SYMPTOMS THAT SHOULD BE REPORTED IMMEDIATELY:  *FEVER GREATER THAN 100.5 F  *CHILLS WITH OR WITHOUT FEVER  NAUSEA AND VOMITING THAT IS NOT CONTROLLED WITH YOUR NAUSEA MEDICATION  *UNUSUAL SHORTNESS OF BREATH  *UNUSUAL BRUISING OR BLEEDING  TENDERNESS IN MOUTH AND THROAT WITH OR WITHOUT PRESENCE OF ULCERS  *URINARY PROBLEMS  *BOWEL PROBLEMS  UNUSUAL RASH Items with * indicate a potential emergency and should be followed up as soon as possible.  Feel free to call the clinic you have any questions or concerns. The clinic phone number is [phone].

## 2015-01-28 ENCOUNTER — Other Ambulatory Visit (HOSPITAL_BASED_OUTPATIENT_CLINIC_OR_DEPARTMENT_OTHER): Payer: No Typology Code available for payment source

## 2015-01-28 ENCOUNTER — Other Ambulatory Visit: Payer: Self-pay | Admitting: Oncology

## 2015-01-28 ENCOUNTER — Encounter: Payer: Self-pay | Admitting: Nurse Practitioner

## 2015-01-28 ENCOUNTER — Ambulatory Visit (HOSPITAL_COMMUNITY)
Admission: RE | Admit: 2015-01-28 | Discharge: 2015-01-28 | Disposition: A | Discharge status: Home / Self Care | Payer: PRIVATE HEALTH INSURANCE | Source: Ambulatory Visit | Attending: Nurse Practitioner | Admitting: Nurse Practitioner

## 2015-01-28 ENCOUNTER — Ambulatory Visit (HOSPITAL_BASED_OUTPATIENT_CLINIC_OR_DEPARTMENT_OTHER): Payer: PRIVATE HEALTH INSURANCE | Admitting: Nurse Practitioner

## 2015-01-28 VITALS — BP 110/76 | HR 115 | Temp 98.8°F | Resp 18 | Ht 65.0 in | Wt 167.2 lb

## 2015-01-28 DIAGNOSIS — T451X5A Adverse effect of antineoplastic and immunosuppressive drugs, initial encounter: Secondary | ICD-10-CM

## 2015-01-28 DIAGNOSIS — C50912 Malignant neoplasm of unspecified site of left female breast: Secondary | ICD-10-CM

## 2015-01-28 DIAGNOSIS — C782 Secondary malignant neoplasm of pleura: Secondary | ICD-10-CM | POA: Insufficient documentation

## 2015-01-28 DIAGNOSIS — K8020 Calculus of gallbladder without cholecystitis without obstruction: Secondary | ICD-10-CM | POA: Diagnosis not present

## 2015-01-28 DIAGNOSIS — I823 Embolism and thrombosis of renal vein: Secondary | ICD-10-CM

## 2015-01-28 DIAGNOSIS — C773 Secondary and unspecified malignant neoplasm of axilla and upper limb lymph nodes: Secondary | ICD-10-CM

## 2015-01-28 DIAGNOSIS — C50212 Malignant neoplasm of upper-inner quadrant of left female breast: Secondary | ICD-10-CM | POA: Diagnosis not present

## 2015-01-28 DIAGNOSIS — Z171 Estrogen receptor negative status [ER-]: Secondary | ICD-10-CM

## 2015-01-28 DIAGNOSIS — Z79899 Other long term (current) drug therapy: Secondary | ICD-10-CM | POA: Insufficient documentation

## 2015-01-28 DIAGNOSIS — I313 Pericardial effusion (noninflammatory): Secondary | ICD-10-CM | POA: Insufficient documentation

## 2015-01-28 DIAGNOSIS — D6481 Anemia due to antineoplastic chemotherapy: Secondary | ICD-10-CM

## 2015-01-28 DIAGNOSIS — G620 Drug-induced polyneuropathy: Secondary | ICD-10-CM

## 2015-01-28 DIAGNOSIS — Z923 Personal history of irradiation: Secondary | ICD-10-CM | POA: Insufficient documentation

## 2015-01-28 DIAGNOSIS — Q279 Congenital malformation of peripheral vascular system, unspecified: Secondary | ICD-10-CM | POA: Diagnosis not present

## 2015-01-28 LAB — CBC WITH DIFFERENTIAL/PLATELET
BASO%: 0.3 % (ref 0.0–2.0)
BASOS ABS: 0 10*3/uL (ref 0.0–0.1)
EOS%: 0.2 % (ref 0.0–7.0)
Eosinophils Absolute: 0 10*3/uL (ref 0.0–0.5)
HCT: 26.4 % — ABNORMAL LOW (ref 34.8–46.6)
HGB: 8.1 g/dL — ABNORMAL LOW (ref 11.6–15.9)
LYMPH#: 0.4 10*3/uL — AB (ref 0.9–3.3)
LYMPH%: 11.8 % — ABNORMAL LOW (ref 14.0–49.7)
MCH: 24.5 pg — AB (ref 25.1–34.0)
MCHC: 30.7 g/dL — ABNORMAL LOW (ref 31.5–36.0)
MCV: 79.8 fL (ref 79.5–101.0)
MONO#: 0 10*3/uL — AB (ref 0.1–0.9)
MONO%: 0.7 % (ref 0.0–14.0)
NEUT#: 3.3 10*3/uL (ref 1.5–6.5)
NEUT%: 87 % — ABNORMAL HIGH (ref 38.4–76.8)
Platelets: 414 10*3/uL — ABNORMAL HIGH (ref 145–400)
RBC: 3.31 10*6/uL — ABNORMAL LOW (ref 3.70–5.45)
RDW: 23.6 % — ABNORMAL HIGH (ref 11.2–14.5)
WBC: 3.8 10*3/uL — ABNORMAL LOW (ref 3.9–10.3)

## 2015-01-28 LAB — COMPREHENSIVE METABOLIC PANEL (CC13)
ALT: 11 U/L (ref 0–55)
ANION GAP: 9 meq/L (ref 3–11)
AST: 22 U/L (ref 5–34)
Albumin: 2.8 g/dL — ABNORMAL LOW (ref 3.5–5.0)
Alkaline Phosphatase: 115 U/L (ref 40–150)
BILIRUBIN TOTAL: 0.49 mg/dL (ref 0.20–1.20)
BUN: 8.6 mg/dL (ref 7.0–26.0)
CHLORIDE: 104 meq/L (ref 98–109)
CO2: 25 meq/L (ref 22–29)
Calcium: 9.6 mg/dL (ref 8.4–10.4)
Creatinine: 0.7 mg/dL (ref 0.6–1.1)
EGFR: >90 mL/min/{1.73_m2} (ref >90)
Glucose: 97 mg/dl (ref 70–140)
Potassium: 3.8 mEq/L (ref 3.5–5.1)
Sodium: 138 mEq/L (ref 136–145)
Total Protein: 7.7 g/dL (ref 6.4–8.3)

## 2015-01-28 MED ORDER — IOHEXOL 300 MG/ML  SOLN
100.0000 mL | Freq: Once | INTRAMUSCULAR | Status: AC | PRN
  Administered 2015-01-28: 100 mL via INTRAVENOUS

## 2015-01-28 MED ORDER — CYCLOBENZAPRINE HCL 5 MG PO TABS: ORAL_TABLET | ORAL | Status: DC

## 2015-01-28 NOTE — Progress Notes (Signed)
ID: Erika [name] OB: January 20, 1971  MR#: [id number]  CSN#:[id number]  PCP: [name], MD GYN:   SU: Dr. [name] OTHER MD:  CHIEF COMPLAINT:  Newly metastatic breast cancer CURRENT TREATMENT: [facility],  goserelin   BREAST CANCER HISTORY: From Dr. [name] original intake note:  "Patient developed a left sided breast mass show up about 6 months prior to diagnosis. 2 months prior this began getting larger rapidly. She was seen in an urgent care where this mass was thought to be an abscess. Apparently there was an attempt to excise or drain this that was not successful and she was referred to Dr. [name] for evaluation. She underwent punch biopsy on 04/23/13 of her skin and the mass. She was then also sent for mm/us. The biopsy shows invasive high grade carcinoma that appears to be breast. It is ER 14%, PR negative, HER-2/neu negative with a Ki-67 that approaches 100%. She has also undergone mm with at least 10 cm upper inner left breast tumor with mild overyling skin thickening. There is also a 3x10 mm cluster of calcs near there also. There is indeterminate 7 mm level 1 left axillary node, she underwent a lymph node biopsy that was positive for disease. No mm evidence of right breast malignancy. An MRI of the breasts was done on 05/22/13 and showed a 14 cm mass in the left breast protruding from the skin and also a 2.5 cm level II axillary node."  Her subsequent history is as detailed below  INTERVAL HISTORY:   Erika [name] returns today for follow up of her breast cancer. Today is day 8, cycle 4 of cyclophosphamide, methotrexate, and fluorouracil given every 21 days.   REVIEW OF SYSTEMS:  Erika [name] denies fevers or chisll. She has had some mild nausea managed with herbal supplements. She had chinese food last night that resulted in 3 hours of diarrhea. This has now resolved. She weighs 5lb less this morning, but this could be fluid volume loss. Her residual neuropathy symptoms are unchanged. Her  palms and soles are hyperpigmented but no longer dry and peeling like when she first began treatment.Marland Kitchen Her back and side pain is managed at a 5/10 with 174mcg worth of fentanyl patches and oxycodone PRN. She also uses flexeril QHS.  She denies shortness of breath, chest pain, cough, or palpitations. She has been anemic but denies lightheadedness or dizziness. She was greatly fatigued the day or two after chemo, but she feels improved afterwards. She sleeps off an on depending on the night.  A detailed review of systems is otherwise negative.   PAST MEDICAL HISTORY: Past Medical History  Diagnosis Date  . Breast cancer     Invasive High Grade Carcinoma  . Headache(784.0)     Hx: of Migraines  . Status post chemotherapy     Adriamycin and Cytoxan  . Anxiety     ANXIOUS ABOUT HAVEING BIG SURGERY - MASTECTOMY  . 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 DVT (deep venous thrombosis)     RT ARM-PT ON LOVENOX  . Hx of iron deficiency anemia 11/26/13  . S/P radiation therapy 02-04-14 to 03-18-14                               1) Left Chest Wall / 50 Gy in 25 fractions/ 2) Left Supraclavicular fossa/ 46 Gy in 23 fractions / 3) Left Posterior Axillary boost / 8.533 Gy in 23 fractions/  4) Left Chest Wall Scar boost / 10 Gy in 5 fractions    PAST SURGICAL HISTORY: Past Surgical History  Procedure Laterality Date  . Tubal ligation    . Portacath placement Right 05/22/2013    Procedure: INSERTION PORT-A-CATH;  Surgeon: [name], MD;  Location: [facility];  Service: General;  Laterality: Right;  . Left breast needle core biopsy Left 05/21/13  . Left breast needle core  biopsy Left 06/22/13  . Mastectomy modified radical Left 11/15/2013    Procedure: LEFT MASTECTOMY MODIFIED RADICAL;  Surgeon: [name], MD;  Location: [facility];  Service: General;  Laterality: Left;    FAMILY HISTORY Family History  Problem Relation Age of Onset  . Hypertension Mother   . Diabetes Father 11  . Stomach cancer  Maternal Aunt   . Hypertension Maternal Grandmother   . Hypertension Maternal Grandfather   . Diabetes Paternal Grandmother   . Diabetes Paternal Grandfather   . Diabetes Paternal Uncle   . Diabetes Paternal Uncle   . Diabetes Paternal Uncle   The patient's mother is alive, age 37. The patient's father died from complications of diabetes at the age of 52. The patient has 2 brothers, one sister. There is no history of breast or ovarian cancer in the family to her knowledge .the patient has not undergone genetic testing   GYNECOLOGIC HISTORY:  Menarche age 70, first live birth age 51. The patient is GX P3. The patient has been on goserelin since 06/13/2013, given monthly  SOCIAL HISTORY:  The patient has worked at as a Furniture conservator/restorer and a Retail buyer. She is currently applying for disability. At home she lives with her youngest son, Erika [name], 73; with [name]; and with [name].   ADVANCED DIRECTIVES: Not in place  HEALTH MAINTENANCE: History  Substance Use Topics  . Smoking status: Never Smoker   . Smokeless tobacco: Never Used  . Alcohol Use: No     Mammogram: 06/22/2013 Colonoscopy: Bone Density Scan:  Pap Smear:  Eye Exam:  Vitamin D Level:   Lipid Panel:    Allergies  Allergen Reactions  . Doxycycline Nausea And Vomiting    Current Outpatient Prescriptions  Medication Sig Dispense Refill  . cyclobenzaprine (FLEXERIL) 5 MG tablet TAKE 1 TABLET BY MOUTH THREE TIMES A DAY FOR MUSCLE SPASMS 30 tablet 1  . fentaNYL (DURAGESIC - DOSED MCG/HR) 25 MCG/HR patch Place 1 patch (25 mcg total) onto the skin every 3 (three) days. 10 patch 0  . fentaNYL (DURAGESIC - DOSED MCG/HR) 75 MCG/HR Place 1 patch (75 mcg total) onto the skin every 3 (three) days. 10 patch 0  . gabapentin (NEURONTIN) 300 MG capsule Take 1 capsule (300 mg total) by mouth 3 (three) times daily. 90 capsule 4  . loratadine (CLARITIN) 10 MG tablet Take 10 mg by mouth  daily as needed for allergies.    Marland Kitchen LORazepam (ATIVAN) 0.5 MG tablet Take 1 tablet (0.5 mg total) by mouth every 6 (six) hours as needed for anxiety. 30 tablet 0  . oxyCODONE (OXY IR/ROXICODONE) 5 MG immediate release tablet Take 1 tablet (5 mg total) by mouth every 8 (eight) hours as needed for severe pain or breakthrough pain. 90 tablet 0  . potassium chloride SA (K-DUR,KLOR-CON) 20 MEQ tablet Take 1 tablet (20 mEq total) by mouth 4 (four) times daily. 120 tablet 1  . PRESCRIPTION MEDICATION PT REPORTS RECEIVING A SHOT ONCE A MONTH AT CANCER CENTER TO STOP HER  MENSTRUAL PERIODS - I SEE DOCUMENTATION IN OFFICE NOTES FROM CANCER CENTER THAT PT WAS GETTING ZOLADEX SQ EVERY 28 DAYS.    Marland Kitchen traMADol (ULTRAM) 50 MG tablet Take 1-2 tablets (50-100 mg total) by mouth every 6 (six) hours as needed. 30 tablet 1  . UNABLE TO FIND Rx: L8000- Post Surgical Bra (Quantity: 6) J4492- Non Silicone Breast prosthesis (Quantity: 1) Dx: 174.9; Left mastectomy 1 each 0  . zolpidem (AMBIEN) 5 MG tablet Take 1 tablet (5 mg total) by mouth at bedtime as needed for sleep. 30 tablet 0  . HYDROcodone-homatropine (HYCODAN) 5-1.5 MG/5ML syrup Take 5 mLs by mouth every 6 (six) hours as needed for cough. (Patient not taking: Reported on 01/28/2015) 240 mL 0  . ibuprofen (ADVIL,MOTRIN) 200 MG tablet Take 600-800 mg by mouth every 4 (four) hours as needed for mild pain or moderate pain.      No current facility-administered medications for this visit.   Facility-Administered Medications Ordered in Other Visits  Medication Dose Route Frequency Provider Last Rate Last Dose  . topical emolient (BIAFINE) emulsion   Topical Daily [name], MD        OBJECTIVE: middle-aged African American woman in no acute distress Filed Vitals:   01/28/15 1112  BP: 110/76  Pulse: 115  Temp: 98.8 F (37.1 C)  Resp: 18     Body mass index is 27.82 kg/(m^2).      ECOG FS:1 - Symptomatic but completely ambulatory   Skin: warm, dry  HEENT:  sclerae anicteric, conjunctivae pink, oropharynx clear. No thrush or mucositis.  Lymph Nodes: No cervical or supraclavicular lymphadenopathy  Lungs: clear to auscultation bilaterally, no rales, wheezes, or rhonci  Heart: regular rate and rhythm  Abdomen: round, soft, non tender, positive bowel sounds  Musculoskeletal: No focal spinal tenderness, no peripheral edema  Neuro: non focal, well oriented, positive affect  Breasts: deferred  LAB RESULTS:  CMP     Component Value Date/Time   NA 141 01/21/2015 1120   NA 138 11/16/2013 0448   K 4.4 01/21/2015 1120   K 3.5 11/16/2013 0448   CL 103 11/16/2013 0448   CL 102 06/01/2013 1500   CO2 26 01/21/2015 1120   CO2 27 11/16/2013 0448   GLUCOSE 90 01/21/2015 1120   GLUCOSE 116* 11/16/2013 0448   GLUCOSE 95 06/01/2013 1500   BUN 4.3* 01/21/2015 1120   BUN 8 11/16/2013 0448   CREATININE 0.7 01/21/2015 1120   CREATININE 0.86 11/16/2013 0448   CALCIUM 9.8 01/21/2015 1120   CALCIUM 8.9 11/16/2013 0448   PROT 7.4 01/21/2015 1120   PROT 8.2 05/18/2013 1205   ALBUMIN 2.5* 01/21/2015 1120   ALBUMIN 2.8* 05/18/2013 1205   AST 19 01/21/2015 1120   AST 18 05/18/2013 1205   ALT 10 01/21/2015 1120   ALT 16 05/18/2013 1205   ALKPHOS 149 01/21/2015 1120   ALKPHOS 111 05/18/2013 1205   BILITOT 0.23 01/21/2015 1120   BILITOT 0.2* 05/18/2013 1205   GFRNONAA 82* 11/16/2013 0448   GFRAA >90 11/16/2013 0448    I No results found for: SPEP  Lab Results  Component Value Date   WBC 3.8* 01/28/2015   NEUTROABS 3.3 01/28/2015   HGB 8.1* 01/28/2015   HCT 26.4* 01/28/2015   MCV 79.8 01/28/2015   PLT 414* 01/28/2015      Chemistry      Component Value Date/Time   NA 141 01/21/2015 1120   NA 138 11/16/2013 0448   K 4.4 01/21/2015 1120  K 3.5 11/16/2013 0448   CL 103 11/16/2013 0448   CL 102 06/01/2013 1500   CO2 26 01/21/2015 1120   CO2 27 11/16/2013 0448   BUN 4.3* 01/21/2015 1120   BUN 8 11/16/2013 0448   CREATININE 0.7 01/21/2015  1120   CREATININE 0.86 11/16/2013 0448      Component Value Date/Time   CALCIUM 9.8 01/21/2015 1120   CALCIUM 8.9 11/16/2013 0448   ALKPHOS 149 01/21/2015 1120   ALKPHOS 111 05/18/2013 1205   AST 19 01/21/2015 1120   AST 18 05/18/2013 1205   ALT 10 01/21/2015 1120   ALT 16 05/18/2013 1205   BILITOT 0.23 01/21/2015 1120   BILITOT 0.2* 05/18/2013 1205       Lab Results  Component Value Date   LABCA2 35 05/18/2013    No components found for: [id number]  No results for input(s): INR in the last 168 hours.  Urinalysis No results found for: COLORURINE  STUDIES: No results found.  ASSESSMENT: 44 y.o.  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  (0) genetic testing of the ATM, BARD1, BRCA1, BRCA2, BRIP1, CDH1, CHEK2, EPCAM, FANCC, MLH1, MSH2, MSH6, NBN, PALB2, PMS2, PTEN, RAD51C, RAD51D, STK11, TP53, and XRCC2 genes showed no deleterious mutations  (1) status post left breast biopsy 04/23/2013 for a clinical T4 N1, stage IIIB invasive ductal carcinoma, grade 3,  estrogen receptor 14% positive with moderate staining intensity, progesterone receptor negative,  with an MIB-1 of  100% and HER-2/neu  nonamplified  (2) biopsy of a left axillary lymph node [id number] was positive  (3) started monthly goserelin July of 02/01/2013  (4)  received  neoadjuvant chemotherapy with doxorubicin and cyclophosphamide in dose dense fashion x4, followed by carboplatin and paclitaxel x1, discontinued due to neuropathy, followed by carboplatin and gemcitabine, both given day 1 of each 14 day cycle x6, completed 10/12/2013 .   (5) right upper extremity DVT documented 06/29/2013,  treated with Lovenox from  July  through  December 2014    (6) status post left modified radical mastectomy 11/15/2013 for a residual pT3 pN0 invasive ductal carcinoma, grade 3, with repeat prognostic panel triple negative. All 12 axillary lymph nodes were clear. Margins were negative   (5)  completed adjuvant radiation therapy 03/18/2014,  with capecitabine added 2-3 weeks into treatment.  METASTATIC DISEASE  (6) CT scans of the chest, abdomen, and pelvis 06/26/2014 showed a large right pleural effusion  with pleural studding, 2 left upper lobe nodules, and two  liver lesions. Cytology from right thoracentesis 07/03/2014 showed malignant cells consistent with the patient's known primary  (7) started eribulin 07/09/2014, given days 1 and 8 of each 21 day cycle; restagind studies after cycle 3 showed evidence of response. Discontinued after cycle 5 (last dose 10/08/14) because of worsening peripheral neuropathy--also with progression.   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 The labs were reviewed in detail and were relatively stable. She has continued treatment related anemia with an HGB of 8.1. She remains asymptomatic at this time and we will continue to monitor this value.   I refilled her flexeril during this visit, which has been instrumental in her back and leg spasms.  Kyaira is headed for a repeat CT scan shortly after our visit. She has a follow up appointment  with Dr. Jana Hakim next week to review the results. She understands and agrees with this plan. She knows the goal of treatment in her case is control. She has been encouraged to call with any issues that might arise before her next visit here.   Marcelino Duster, [place] [phone] 01/28/2015 11:33 AM

## 2015-01-28 NOTE — Addendum Note (Signed)
Addended by: [name] on: 01/28/2015 05:59 PM   Modules accepted: Orders

## 2015-01-28 NOTE — Progress Notes (Signed)
After Erika [name] left our office she proceeded to the CT scans already scheduled. 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 Fortunately there is an accessory left renal vein which is draining the kidney separately.  I discussed this with the patient. She tells me she can come here to more morning to start her Lovenox. We will refer her to the Coumadin clinic at the same time.

## 2015-01-29 ENCOUNTER — Ambulatory Visit: Payer: PRIVATE HEALTH INSURANCE

## 2015-01-29 ENCOUNTER — Other Ambulatory Visit: Payer: Self-pay | Admitting: Oncology

## 2015-01-29 ENCOUNTER — Ambulatory Visit (HOSPITAL_BASED_OUTPATIENT_CLINIC_OR_DEPARTMENT_OTHER): Payer: PRIVATE HEALTH INSURANCE | Admitting: Nurse Practitioner

## 2015-01-29 ENCOUNTER — Telehealth: Payer: Self-pay | Admitting: Oncology

## 2015-01-29 ENCOUNTER — Other Ambulatory Visit: Payer: Self-pay | Admitting: *Deleted

## 2015-01-29 ENCOUNTER — Encounter: Payer: Self-pay | Admitting: Nurse Practitioner

## 2015-01-29 ENCOUNTER — Other Ambulatory Visit: Payer: Self-pay | Admitting: Nurse Practitioner

## 2015-01-29 ENCOUNTER — Ambulatory Visit (HOSPITAL_BASED_OUTPATIENT_CLINIC_OR_DEPARTMENT_OTHER): Payer: PRIVATE HEALTH INSURANCE

## 2015-01-29 VITALS — BP 115/77 | HR 102 | Temp 98.6°F | Resp 20

## 2015-01-29 DIAGNOSIS — C50919 Malignant neoplasm of unspecified site of unspecified female breast: Secondary | ICD-10-CM

## 2015-01-29 DIAGNOSIS — D6481 Anemia due to antineoplastic chemotherapy: Secondary | ICD-10-CM

## 2015-01-29 DIAGNOSIS — C50212 Malignant neoplasm of upper-inner quadrant of left female breast: Secondary | ICD-10-CM

## 2015-01-29 DIAGNOSIS — I823 Embolism and thrombosis of renal vein: Secondary | ICD-10-CM

## 2015-01-29 DIAGNOSIS — Z7901 Long term (current) use of anticoagulants: Secondary | ICD-10-CM

## 2015-01-29 DIAGNOSIS — C787 Secondary malignant neoplasm of liver and intrahepatic bile duct: Secondary | ICD-10-CM

## 2015-01-29 DIAGNOSIS — T451X5A Adverse effect of antineoplastic and immunosuppressive drugs, initial encounter: Secondary | ICD-10-CM

## 2015-01-29 DIAGNOSIS — C50912 Malignant neoplasm of unspecified site of left female breast: Secondary | ICD-10-CM

## 2015-01-29 LAB — PROTIME-INR
INR: 1.2 — ABNORMAL LOW (ref 2.00–3.50)
PROTIME: 14.4 s — AB (ref 10.6–13.4)

## 2015-01-29 MED ORDER — WARFARIN SODIUM 7.5 MG PO TABS: 7.5000 mg | ORAL_TABLET | Freq: Every day | ORAL | Status: DC

## 2015-01-29 MED ORDER — ENOXAPARIN SODIUM 120 MG/0.8ML ~~LOC~~ SOLN
120.0000 mg | Freq: Once | SUBCUTANEOUS | Status: AC
  Administered 2015-01-29: 120 mg via SUBCUTANEOUS
  Filled 2015-01-29: qty 0.8

## 2015-01-29 NOTE — Assessment & Plan Note (Signed)
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  Patient will be initiated today with Lovenox 120 mg subcutaneously daily; as well as Coumadin 7.5 mg on a daily basis.  Patient met with the Coumadin clinic today to obtain a PT/INR.  Patient will return to the cancer Center on a daily basis for the rest of this week for her Lovenox injection.  She will be given a Lovenox injection to inject herself on Sunday, 02/02/2015.  Patient will return to the Coumadin clinic on Monday, 02/03/2015.  

## 2015-01-29 NOTE — Patient Instructions (Addendum)
You received your 1st Lovenox injection in our office today with Selena Lesser, NP on your right side. Please keep your injection appointments for your Lovenox injections.   Warfarin: What You Need to Know Warfarin is an anticoagulant. Anticoagulants help prevent the formation of blood clots. They also help stop the growth of blood clots. Warfarin is sometimes referred to as a "blood thinner."  Normally, when body tissues are cut or damaged, the blood clots in order to prevent blood loss. Sometimes clots form inside your blood vessels and obstruct the flow of blood through your circulatory system (thrombosis). These clots may travel through your bloodstream and become lodged in smaller blood vessels in your brain, which can cause a stroke, or in your lungs (pulmonary embolism). WHO SHOULD USE WARFARIN? Warfarin is prescribed for people at risk of developing harmful blood clots:  People with surgically implanted mechanical heart valves, irregular heart rhythms called atrial fibrillation, and certain clotting disorders.  People who have developed harmful blood clotting in the past, including those who have had a stroke or a pulmonary embolism, or thrombosis in their legs (deep vein thrombosis [DVT]).  People with an existing blood clot, such as a pulmonary embolism. WARFARIN DOSING Warfarin tablets come in different strengths. Each tablet strength is a different color, with the amount of warfarin (in milligrams) clearly printed on the tablet. If the color of your tablet is different than usual when you receive a new prescription, report it immediately to your pharmacist or health care provider. WARFARIN MONITORING The goal of warfarin therapy is to lessen the clotting tendency of blood but not prevent clotting completely. Your health care provider will monitor the anticoagulation effect of warfarin closely and adjust your dose as needed. For your safety, blood tests called prothrombin time (PT) or  international normalized ratio (INR) are used to measure the effects of warfarin. Both of these tests can be done with a finger stick or a blood draw. The longer it takes the blood to clot, the higher the PT or INR. Your health care provider will inform you of your "target" PT or INR range. If, at any time, your PT or INR is above the target range, there is a risk of bleeding. If your PT or INR is below the target range, there is a risk of clotting. Whether you are started on warfarin while you are in the hospital or in your health care provider's office, you will need to have your PT or INR checked within one week of starting the medicine. Initially, some people are asked to have their PT or INR checked as much as twice a week. Once you are on a stable maintenance dose, the PT or INR is checked less often, usually once every 2 to 4 weeks. The warfarin dose may be adjusted if the PT or INR is not within the target range. It is important to keep all laboratory and health care provider follow-up appointments. Not keeping appointments could result in a chronic or permanent injury, pain, or disability because warfarin is a medicine that requires close monitoring. WHAT ARE THE SIDE EFFECTS OF WARFARIN?  Too much warfarin can cause bleeding (hemorrhage) from any part of the body. This may include bleeding from the gums, blood in the urine, bloody or dark stools, a nosebleed that is not easily stopped, coughing up blood, or vomiting blood.  Too little warfarin can increase the risk of blood clots.  Too little or too much warfarin can also increase the risk  of a stroke.  Warfarin use may cause a skin rash or irritation, an unusual fever, continual nausea or stomach upset, or severe pain in your joints or back. SPECIAL PRECAUTIONS WHILE TAKING WARFARIN Warfarin should be taken exactly as directed. It is very important to take warfarin as directed since bleeding or blood clots could result in chronic or  permanent injury, pain, or disability.  Take your medicine at the same time every day. If you forget to take your dose, you can take it if it is within 6 hours of when it was due.  Do not change the dose of warfarin on your own to make up for missed or extra doses.  If you miss more than 2 doses in a row, you should contact your health care provider for advice. Avoid situations that cause bleeding. You may have a tendency to bleed more easily than usual while taking warfarin. The following actions can limit bleeding:  Using a softer toothbrush.  Flossing with waxed floss rather than unwaxed floss.  Shaving with an Copy rather than a blade.  Limiting the use of sharp objects.  Avoiding potentially harmful activities, such as contact sports. Warfarin and Pregnancy or Breastfeeding  Warfarin is not advised during the first trimester of pregnancy due to an increased risk of birth defects. In certain situations, a woman may take warfarin after her first trimester of pregnancy. A woman who becomes pregnant or plans to become pregnant while taking warfarin should notify her health care provider immediately.  Although warfarin does not pass into breast milk, a woman who wishes to breastfeed while taking warfarin should also consult with her health care provider. Alcohol, Smoking, and Illicit Drug Use  Alcohol affects how warfarin works in the body. It is best to avoid alcoholic drinks or consume very small amounts while taking warfarin. In general, alcohol intake should be limited to 1 oz (30 mL) of liquor, 6 oz (180 mL) of wine, or 12 oz (360 mL) of beer each day. Notify your health care provider if you change your alcohol intake.  Smoking affects how warfarin works. It is best to avoid smoking while taking warfarin. Notify your health care provider if you change your smoking habits.  It is best to avoid all illicit drugs while taking warfarin since there are few studies that show how  warfarin interacts with these drugs. Other Medicines and Dietary Supplements Many prescription and over-the-counter medicines can interfere with warfarin. Be sure all of your health care providers know you are taking warfarin. Notify your health care provider who prescribed warfarin for you or your pharmacist before starting or stopping any new medicines, including over-the-counter vitamins, dietary supplements, and pain medicines. Your warfarin dose may need to be adjusted. Some common over-the-counter medicines that may increase the risk of bleeding while taking warfarin include:   Acetaminophen.  Aspirin.  Nonsteroidal anti-inflammatory medicines (NSAIDs), such as ibuprofen or naproxen.  Vitamin E. Dietary Considerations  Foods that have moderate or high amounts of vitamin K can interfere with warfarin. Avoid major changes in your diet or notify your health care provider before changing your diet. Eat a consistent amount of foods that have moderate or high amounts of vitamin K. Eating less foods containing vitamin K can increase the risk of bleeding. Eating more foods containing vitamin K can increase the risk of blood clots. Additional questions about dietary considerations can be discussed with a dietitian. Foods that are very high in vitamin K:  Greens, such as  Swiss chard and beet, collard, mustard, or turnip greens (fresh or frozen, cooked).  Kale (fresh or frozen, cooked).  Parsley (raw).  Spinach (cooked). Foods that are high in vitamin K:  Asparagus (frozen, cooked).  Beans, green (frozen, cooked).  Broccoli.  Bok choy (cooked).  Brussels sprouts (fresh or frozen, cooked).  Cabbage (cooked).   Coleslaw. Foods that are moderately high in vitamin K:  Blueberries.  Black-eyed peas.  Endive (raw).  Green leaf lettuce (raw).  Green scallions (raw).  Kale (raw).  Okra (frozen, cooked).  Plantains (fried).  Romaine lettuce (raw).  Sauerkraut  (canned).  Spinach (raw). CALL YOUR CLINIC OR HEALTH CARE PROVIDER IF YOU:  Plan to have any surgery or procedure.  Feel sick, especially if you have diarrhea or vomiting.  Experience or anticipate any major changes in your diet.  Start or stop a prescription or over-the-counter medicine.  Become, plan to become, or think you may be pregnant.  Are having heavier than usual menstrual periods.  Have had a fall, accident, or any symptoms of bleeding or unusual bruising.  Develop an unusual fever. CALL 911 IN THE [place] OR GO TO THE EMERGENCY DEPARTMENT IF YOU:   Think you may be having an allergic reaction to warfarin. The signs of an allergic reaction could include itching, rash, hives, swelling, chest tightness, or trouble breathing.  See signs of blood in your urine. The signs could include reddish, pinkish, or tea-colored urine.  See signs of blood in your stools. The signs could include bright red or black stools.  Vomit or cough up blood. In these instances, the blood could have either a bright red or a "coffee-grounds" appearance.  Have bleeding that will not stop after applying pressure for 30 minutes such as cuts, nosebleeds, or other injuries.  Have severe pain in your joints or back.  Have a new and severe headache.  Have sudden weakness or numbness of your face, arm, or leg, especially on one side of your body.  Have sudden confusion or trouble understanding.  Have sudden trouble seeing in one or both eyes.  Have sudden trouble walking, dizziness, loss of balance, or coordination.  Have trouble speaking or understanding (aphasia). Document Released: 11/29/2005 Document Revised: 04/15/2014 Document Reviewed: 05/25/2013 [facility] Patient Information 2015 [place], [place]. This information is not intended to replace advice given to you by your health care provider. Make sure you discuss any questions you have with your health care provider.    Enoxaparin  injection What is this medicine? ENOXAPARIN (ee nox a PA rin) is used after knee, hip, or abdominal surgeries to prevent blood clotting. It is also used to treat existing blood clots in the lungs or in the veins. This medicine may be used for other purposes; ask your health care provider or pharmacist if you have questions. COMMON BRAND NAME(S): Lovenox What should I tell my health care provider before I take this medicine? They need to know if you have any of these conditions: -bleeding disorders, hemorrhage, or hemophilia -infection of the heart or heart valves -kidney or liver disease -previous stroke -prosthetic heart valve -recent surgery or delivery of a baby -ulcer in the stomach or intestine, diverticulitis, or other bowel disease -an unusual or allergic reaction to enoxaparin, heparin, pork or pork products, other medicines, foods, dyes, or preservatives -pregnant or trying to get pregnant -breast-feeding How should I use this medicine? This medicine is for injection under the skin. It is usually given by a health-care professional. You or  a family member may be trained on how to give the injections. If you are to give yourself injections, make sure you understand how to use the syringe, measure the dose if necessary, and give the injection. To avoid bruising, do not rub the site where this medicine has been injected. Do not take your medicine more often than directed. Do not stop taking except on the advice of your doctor or health care professional. Make sure you receive a puncture-resistant container to dispose of the needles and syringes once you have finished with them. Do not reuse these items. Return the container to your doctor or health care professional for proper disposal. Talk to your pediatrician regarding the use of this medicine in children. Special care may be needed. Overdosage: If you think you have taken too much of this medicine contact a poison control center or  emergency room at once. NOTE: This medicine is only for you. Do not share this medicine with others. What if I miss a dose? If you miss a dose, take it as soon as you can. If it is almost time for your next dose, take only that dose. Do not take double or extra doses. What may interact with this medicine? -aspirin and aspirin-like medicines -certain medicines that treat or prevent blood clots -dipyridamole -NSAIDs, medicines for pain and inflammation, like ibuprofen or naproxen This list may not describe all possible interactions. Give your health care provider a list of all the medicines, herbs, non-prescription drugs, or dietary supplements you use. Also tell them if you smoke, drink alcohol, or use illegal drugs. Some items may interact with your medicine. What should I watch for while using this medicine? Visit your doctor or health care professional for regular checks on your progress. Your condition will be monitored carefully while you are receiving this medicine. Notify your doctor or health care professional and seek emergency treatment if you develop breathing problems; changes in vision; chest pain; severe, sudden headache; pain, swelling, warmth in the leg; trouble speaking; sudden numbness or weakness of the face, arm, or leg. These can be signs that your condition has gotten worse. If you are going to have surgery, tell your doctor or health care professional that you are taking this medicine. Do not stop taking this medicine without first talking to your doctor. Be sure to refill your prescription before you run out of medicine. Avoid sports and activities that might cause injury while you are using this medicine. Severe falls or injuries can cause unseen bleeding. Be careful when using sharp tools or knives. Consider using an Copy. Take special care brushing or flossing your teeth. Report any injuries, bruising, or red spots on the skin to your doctor or health care  professional. What side effects may I notice from receiving this medicine? Side effects that you should report to your doctor or health care professional as soon as possible: -allergic reactions like skin rash, itching or hives, swelling of the face, lips, or tongue -feeling faint or lightheaded, falls -signs and symptoms of bleeding such as bloody or black, tarry stools; red or dark-brown urine; spitting up blood or brown material that looks like coffee grounds; red spots on the skin; unusual bruising or bleeding from the eye, gums, or nose Side effects that usually do not require medical attention (report to your doctor or health care professional if they continue or are bothersome): -pain, redness, or irritation at site where injected This list may not describe all possible side effects.  Call your doctor for medical advice about side effects. You may report side effects to FDA at 1-800-FDA-1088. Where should I keep my medicine? Keep out of the reach of children. Store at room temperature between 15 and 30 degrees C (59 and 86 degrees F). Do not freeze. If your injections have been specially prepared, you may need to store them in the refrigerator. Ask your pharmacist. Throw away any unused medicine after the expiration date. NOTE: This sheet is a summary. It may not cover all possible information. If you have questions about this medicine, talk to your doctor, pharmacist, or health care provider.  2015, [employer]. (2014-04-02 16:06:21)

## 2015-01-29 NOTE — Assessment & Plan Note (Signed)
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  Patient will be initiated today with Lovenox 120 mg subcutaneously daily; as well as Coumadin 7.5 mg on a daily basis.  Patient met with the Coumadin clinic today to obtain a PT/INR.  Patient will return to the [facility] on a daily basis for the rest of this week for her Lovenox injection.  She will be given a Lovenox injection to inject herself on Sunday, 02/02/2015.  Patient will return to the Coumadin clinic on Monday, 02/03/2015.

## 2015-01-29 NOTE — Telephone Encounter (Signed)
, °

## 2015-01-29 NOTE — Progress Notes (Signed)
SYMPTOM MANAGEMENT CLINIC   HPI: Erika [name] 44 y.o. female diagnosed with breast cancer; with metastasis to the liver.  Currently undergoing Cytoxan/methotrexate/5-FU chemotherapy regimen.  Patient underwent a restaging CT with contrast of the chest/abdomen/pelvis yesterday afternoon.  Scan results revealed a mixed response to therapy.  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  Patient will be initiated today with Lovenox 120 mg subcutaneously daily; as well as Coumadin 7.5 mg on a daily basis.  Patient met with the Coumadin clinic today to obtain a PT/INR.  Patient will return to the [facility] on a daily basis for the rest of this week for her Lovenox injection.  She will be given a Lovenox injection to inject herself on Sunday, 02/02/2015.  Patient denies any UTI symptoms whatsoever.  She also denies any recent fevers or chills.  HPI  CURRENT THERAPY: Upcoming Treatment Dates - BREAST Adjuvant CMF IV q21d Days with orders from any treatment category:  02/11/2015      SCHEDULING COMMUNICATION      ondansetron (ZOFRAN) IVPB 8 mg      dexamethasone (DECADRON) injection 10 mg      cyclophosphamide (CYTOXAN) 1,180 mg in sodium chloride 0.9 % 250 mL chemo infusion      methotrexate chemo injection 80 mg      fluorouracil (ADRUCIL) chemo injection 1,200 mg      sodium chloride 0.9 % injection 10 mL      heparin lock flush 100 unit/mL      heparin lock flush 100 unit/mL      alteplase (CATHFLO ACTIVASE) injection 2 mg      sodium chloride 0.9 % injection 3 mL      Cold Pack 1 packet      0.9 %  sodium chloride infusion      TREATMENT CONDITIONS 03/04/2015      SCHEDULING COMMUNICATION      ondansetron (ZOFRAN) IVPB 8 mg      dexamethasone (DECADRON) injection 10 mg      cyclophosphamide (CYTOXAN) 1,180 mg in sodium chloride 0.9 % 250 mL chemo infusion      methotrexate chemo injection 80  mg      fluorouracil (ADRUCIL) chemo injection 1,200 mg      sodium chloride 0.9 % injection 10 mL      heparin lock flush 100 unit/mL      heparin lock flush 100 unit/mL      alteplase (CATHFLO ACTIVASE) injection 2 mg      sodium chloride 0.9 % injection 3 mL      Cold Pack 1 packet      0.9 %  sodium chloride infusion      TREATMENT CONDITIONS    ROS  Past Medical History  Diagnosis Date  . Breast cancer     Invasive High Grade Carcinoma  . Headache(784.0)     Hx: of Migraines  . Status post chemotherapy     Adriamycin and Cytoxan  . Anxiety     ANXIOUS ABOUT HAVEING BIG SURGERY - MASTECTOMY  . 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 DVT (deep venous thrombosis)     RT ARM-PT ON LOVENOX  . Hx of iron deficiency anemia 11/26/13  . S/P radiation therapy 02-04-14 to 03-18-14  1) Left Chest Wall / 50 Gy in 25 fractions/ 2) Left Supraclavicular fossa/ 46 Gy in 23 fractions / 3) Left Posterior Axillary boost / 8.533 Gy in 23 fractions/ 4) Left Chest Wall Scar boost / 10 Gy in 5 fractions    Past Surgical History  Procedure Laterality Date  . Tubal ligation    . Portacath placement Right 05/22/2013    Procedure: INSERTION PORT-A-CATH;  Surgeon: [name], MD;  Location: [place];  Service: General;  Laterality: Right;  . Left breast needle core biopsy Left 05/21/13  . Left breast needle core  biopsy Left 06/22/13  . Mastectomy modified radical Left 11/15/2013    Procedure: LEFT MASTECTOMY MODIFIED RADICAL;  Surgeon: [name], MD;  Location: [facility];  Service: General;  Laterality: Left;    has DVT (deep venous thrombosis); S/P mastectomy; Breast cancer of upper-inner quadrant of left female breast; Hypokalemia; Neuropathy due to chemotherapeutic drug; Neoplasm related pain; Hand foot syndrome; Anemia associated with chemotherapy; Thrombocytosis; Cancer associated pain; URI (upper respiratory infection); Malignant neoplasm of upper-inner  quadrant of female breast, left; Long term current use of anticoagulant therapy; and Renal vein thrombosis on her problem list.    is allergic to doxycycline.    Medication List       This list is accurate as of: 01/29/15 11:00 AM.  Always use your most recent med list.               cyclobenzaprine 5 MG tablet  Commonly known as:  FLEXERIL  TAKE 1 TABLET BY MOUTH THREE TIMES A DAY FOR MUSCLE SPASMS     fentaNYL 25 MCG/HR patch  Commonly known as:  [place] - dosed mcg/hr  Place 1 patch (25 mcg total) onto the skin every 3 (three) days.     fentaNYL 75 MCG/HR  Commonly known as:  DURAGESIC - dosed mcg/hr  Place 1 patch (75 mcg total) onto the skin every 3 (three) days.     gabapentin 300 MG capsule  Commonly known as:  NEURONTIN  Take 1 capsule (300 mg total) by mouth 3 (three) times daily.     HYDROcodone-homatropine 5-1.5 MG/5ML syrup  Commonly known as:  HYCODAN  Take 5 mLs by mouth every 6 (six) hours as needed for cough.     ibuprofen 200 MG tablet  Commonly known as:  ADVIL,MOTRIN  Take 600-800 mg by mouth every 4 (four) hours as needed for mild pain or moderate pain.     loratadine 10 MG tablet  Commonly known as:  CLARITIN  Take 10 mg by mouth daily as needed for allergies.     LORazepam 0.5 MG tablet  Commonly known as:  ATIVAN  Take 1 tablet (0.5 mg total) by mouth every 6 (six) hours as needed for anxiety.     oxyCODONE 5 MG immediate release tablet  Commonly known as:  Oxy IR/ROXICODONE  Take 1 tablet (5 mg total) by mouth every 8 (eight) hours as needed for severe pain or breakthrough pain.     potassium chloride SA 20 MEQ tablet  Commonly known as:  K-DUR,KLOR-CON  Take 1 tablet (20 mEq total) by mouth 4 (four) times daily.     PRESCRIPTION MEDICATION  PT REPORTS RECEIVING A SHOT ONCE A MONTH AT [facility]ER CENTER TO STOP HER MENSTRUAL PERIODS - I SEE DOCUMENTATION IN OFFICE NOTES FROM CANCER CENTER THAT PT WAS GETTING ZOLADEX SQ EVERY 28 DAYS.      traMADol 50 MG tablet  Commonly known  as:  ULTRAM  Take 1-2 tablets (50-100 mg total) by mouth every 6 (six) hours as needed.     UNABLE TO FIND  - Rx: L8000- Post Surgical Bra (Quantity: 6)  - J8563- Non Silicone Breast prosthesis (Quantity: 1)  - Dx: 174.9; Left mastectomy     zolpidem 5 MG tablet  Commonly known as:  AMBIEN  Take 1 tablet (5 mg total) by mouth at bedtime as needed for sleep.         PHYSICAL EXAMINATION  Blood pressure 115/77, pulse 102, temperature 98.6 F (37 C), temperature source Oral, resp. rate 20, last menstrual period 05/30/2013, SpO2 100 %.  Physical Exam  Constitutional: She is oriented to person, place, and time and well-developed, well-nourished, and in no distress.  HENT:  Head: Normocephalic and atraumatic.  Eyes: Conjunctivae and EOM are normal. Pupils are equal, round, and reactive to light.  Neck: Normal range of motion.  Pulmonary/Chest: Effort normal. No respiratory distress.  Musculoskeletal: Normal range of motion.  Neurological: She is alert and oriented to person, place, and time. Gait normal.  Psychiatric: Affect normal.  Nursing note and vitals reviewed.   LABORATORY DATA:. Appointment on 01/29/2015  Component Date Value Ref Range Status  . Protime 01/29/2015 14.4* 10.6 - 13.4 Seconds Final  . INR 01/29/2015 1.20* 2.00 - 3.50 Final   Comment: INR is useful only to assess adequacy of anticoagulation with coumadin when comparing results from different labs. It should not be used to estimate bleeding risk or presence/abscense of coagulopathy in patients not on coumadin. Expected INR ranges for  nontherapeutic patients is 0.88 - 1.12.   Marland Kitchen Lovenox 01/29/2015 NO   Final  Appointment on 01/28/2015  Component Date Value Ref Range Status  . WBC 01/28/2015 3.8* 3.9 - 10.3 10e3/uL Final  . NEUT# 01/28/2015 3.3  1.5 - 6.5 10e3/uL Final  . HGB 01/28/2015 8.1* 11.6 - 15.9 g/dL Final  . HCT 01/28/2015 26.4* 34.8 - 46.6 % Final  .  Platelets 01/28/2015 414* 145 - 400 10e3/uL Final  . MCV 01/28/2015 79.8  79.5 - 101.0 fL Final  . MCH 01/28/2015 24.5* 25.1 - 34.0 pg Final  . MCHC 01/28/2015 30.7* 31.5 - 36.0 g/dL Final  . RBC 01/28/2015 3.31* 3.70 - 5.45 10e6/uL Final  . RDW 01/28/2015 23.6* 11.2 - 14.5 % Final  . lymph# 01/28/2015 0.4* 0.9 - 3.3 10e3/uL Final  . MONO# 01/28/2015 0.0* 0.1 - 0.9 10e3/uL Final  . Eosinophils Absolute 01/28/2015 0.0  0.0 - 0.5 10e3/uL Final  . Basophils Absolute 01/28/2015 0.0  0.0 - 0.1 10e3/uL Final  . NEUT% 01/28/2015 87.0* 38.4 - 76.8 % Final  . LYMPH% 01/28/2015 11.8* 14.0 - 49.7 % Final  . MONO% 01/28/2015 0.7  0.0 - 14.0 % Final  . EOS% 01/28/2015 0.2  0.0 - 7.0 % Final  . BASO% 01/28/2015 0.3  0.0 - 2.0 % Final  . Sodium 01/28/2015 138  136 - 145 mEq/L Final  . Potassium 01/28/2015 3.8  3.5 - 5.1 mEq/L Final  . Chloride 01/28/2015 104  98 - 109 mEq/L Final  . CO2 01/28/2015 25  22 - 29 mEq/L Final  . Glucose 01/28/2015 97  70 - 140 mg/dl Final  . BUN 01/28/2015 8.6  7.0 - 26.0 mg/dL Final  . Creatinine 01/28/2015 0.7  0.6 - 1.1 mg/dL Final  . Total Bilirubin 01/28/2015 0.49  0.20 - 1.20 mg/dL Final  . Alkaline Phosphatase 01/28/2015 115  40 - 150 U/L Final  .  AST 01/28/2015 22  5 - 34 U/L Final  . ALT 01/28/2015 11  0 - 55 U/L Final  . Total Protein 01/28/2015 7.7  6.4 - 8.3 g/dL Final  . Albumin 01/28/2015 2.8* 3.5 - 5.0 g/dL Final  . Calcium 01/28/2015 9.6  8.4 - 10.4 mg/dL Final  . Anion Gap 01/28/2015 9  3 - 11 mEq/L Final  . EGFR 01/28/2015 >90  >90 ml/min/1.73 m2 Final   eGFR is calculated using the CKD-EPI Creatinine Equation (2009)     RADIOGRAPHIC STUDIES: Ct Chest W Contrast  01/28/2015   ADDENDUM REPORT: 01/28/2015 17:35  ADDENDUM: Thrombus measures 36 mm along the left renal vein and 12 mm in thickness. Thrombus mildly expands the vein.  Findings conveyed toFeng, MDon 01/28/2015  at17:35.   Electronically Signed   By: Suzy Bouchard M.D.   On: 01/28/2015  17:35   01/28/2015   CLINICAL DATA:  Left breast cancer subsequent treatment strategy. Chemotherapy in progress. Radiation therapy complete. 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  EXAM: CT CHEST, ABDOMEN, AND PELVIS WITH CONTRAST  TECHNIQUE: Multidetector CT imaging of the chest, abdomen and pelvis was performed following the standard protocol during bolus administration of intravenous contrast.  CONTRAST:  158m OMNIPAQUE IOHEXOL 300 MG/ML  SOLN  COMPARISON:  11/04/2014  FINDINGS: CT CHEST FINDINGS  Mediastinum/Nodes: No axillary supraclavicular lymphadenopathy. No mediastinal hilar lymphadenopathy. No pericardial fluid.  Lungs/Pleura: There is a rind of pleural thickening with the right hemi thorax with multiple nodules consists with metastatic disease. The rind like nodular thickening occupies the entirety of the pleural surface. There does appear mild improvement of the nodularity. For example nodule along the right bronchus intermedius measures 12 mm short axis compared to 24 mm on prior (image 27, series 2). Nodule along the right upper mediastinal pleural surface measures 12 x 8 mm compared to 17 x 9 mm. The nodule pleural thickening along the oblique fissure inferiorly. For example 21 mm nodule on image 37, series is 2 compares to 24 mm on prior. Nodule in the right upper lobe abutting pleural surface measures 20 mm compared to 18 mm on prior. Small pericardial effusion. The right left lung is clear disease.  Musculoskeletal: No aggressive osseous lesion.  CT ABDOMEN AND PELVIS FINDINGS  Hepatobiliary: There is extension of the right pleural metastasis deep into the sulcus on the right with nodularity along the right abdomen wall. Direct extension into the liver parenchyma from this deep pleural metastasis. Metastatic lesion measures 29 x 21 mm in the right hepatic lobe compared to 32 x 20 mm on prior. There is benign fatty infiltration along the falciform ligament.  There are  multiple irregular gallstones in the gallbladder. The largest measures 17 mm.  Pancreas: Pancreas is normal. No ductal dilatation. No pancreatic inflammation.  Spleen: Normal spleen.  Adrenals/Urinary Tract: Adrenal glands are normal. Kidneys are ureters bladder normal.  Stomach/Bowel: Stomach, small bowel, and cecum normal. The colon and rectosigmoid colon are normal.  Vascular/Lymphatic: Abdominal or is normal caliber. There is filling defect within the left renal vein on the portal venous phase of imaging. This persists on the delayed renal phase imaging (image 17, series 4. There is a second left retro aortic renal vein which is patent. The kidneys enhance symmetrically.  No retroperitoneal periportal lymphadenopathy. No pelvic lymphadenopathy  Reproductive: Uterus and ovaries are normal.  Other: No free-fluid pattern pelvis. No peritoneal disease or omental metastasis  Musculoskeletal: Aggressive osseous lesion.  IMPRESSION: Chest Impression:  1. 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 Some lesions measures slightly smaller while others measures slightly larger. No evidence of progression per se. 2. No mediastinal lymphadenopathy  Abdomen / Pelvis Impression:  1. Extension of the pleural metastasis deep into the sulcus of the right lateral pleural space. Nodularity along the right lateral abdominal wall consistent metastatic extension into the peritoneum. 2. Direct extension to the right hepatic lobe from this pleural metastasis not changed from prior. 3. Filling defect within the left renal vein most consistent with bland thrombus. Accessory left retro aortic renal vein on the left.  Electronically Signed: By: Suzy Bouchard M.D. On: 01/28/2015 16:52   Ct Abdomen Pelvis W Contrast  01/28/2015   ADDENDUM REPORT: 01/28/2015 17:35  ADDENDUM: Thrombus measures 36 mm along the left renal vein and 12 mm in thickness. Thrombus mildly expands  the vein.  Findings conveyed toFeng, MDon 01/28/2015  at17:35.   Electronically Signed   By: Suzy Bouchard M.D.   On: 01/28/2015 17:35   01/28/2015   CLINICAL DATA:  Left breast cancer subsequent treatment strategy. Chemotherapy in progress. Radiation therapy complete. 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  EXAM: CT CHEST, ABDOMEN, AND PELVIS WITH CONTRAST  TECHNIQUE: Multidetector CT imaging of the chest, abdomen and pelvis was performed following the standard protocol during bolus administration of intravenous contrast.  CONTRAST:  160m OMNIPAQUE IOHEXOL 300 MG/ML  SOLN  COMPARISON:  11/04/2014  FINDINGS: CT CHEST FINDINGS  Mediastinum/Nodes: No axillary supraclavicular lymphadenopathy. No mediastinal hilar lymphadenopathy. No pericardial fluid.  Lungs/Pleura: There is a rind of pleural thickening with the right hemi thorax with multiple nodules consists with metastatic disease. The rind like nodular thickening occupies the entirety of the pleural surface. There does appear mild improvement of the nodularity. For example nodule along the right bronchus intermedius measures 12 mm short axis compared to 24 mm on prior (image 27, series 2). Nodule along the right upper mediastinal pleural surface measures 12 x 8 mm compared to 17 x 9 mm. The nodule pleural thickening along the oblique fissure inferiorly. For example 21 mm nodule on image 37, series is 2 compares to 24 mm on prior. Nodule in the right upper lobe abutting pleural surface measures 20 mm compared to 18 mm on prior. Small pericardial effusion. The right left lung is clear disease.  Musculoskeletal: No aggressive osseous lesion.  CT ABDOMEN AND PELVIS FINDINGS  Hepatobiliary: There is extension of the right pleural metastasis deep into the sulcus on the right with nodularity along the right abdomen wall. Direct extension into the liver parenchyma from this deep pleural metastasis. Metastatic lesion measures 29 x 21 mm in  the right hepatic lobe compared to 32 x 20 mm on prior. There is benign fatty infiltration along the falciform ligament.  There are multiple irregular gallstones in the gallbladder. The largest measures 17 mm.  Pancreas: Pancreas is normal. No ductal dilatation. No pancreatic inflammation.  Spleen: Normal spleen.  Adrenals/Urinary Tract: Adrenal glands are normal. Kidneys are ureters bladder normal.  Stomach/Bowel: Stomach, small bowel, and cecum normal. The colon and rectosigmoid colon are normal.  Vascular/Lymphatic: Abdominal or is normal caliber. There is filling defect within the left renal vein on the portal venous phase of imaging. This persists on the delayed renal phase imaging (image 17, series 4. There is a second left retro aortic renal vein which is patent. The kidneys enhance symmetrically.  No retroperitoneal periportal lymphadenopathy.  No pelvic lymphadenopathy  Reproductive: Uterus and ovaries are normal.  Other: No free-fluid pattern pelvis. No peritoneal disease or omental metastasis  Musculoskeletal: Aggressive osseous lesion.  IMPRESSION: Chest Impression:  1. 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 Some lesions measures slightly smaller while others measures slightly larger. No evidence of progression per se. 2. No mediastinal lymphadenopathy  Abdomen / Pelvis Impression:  1. Extension of the pleural metastasis deep into the sulcus of the right lateral pleural space. Nodularity along the right lateral abdominal wall consistent metastatic extension into the peritoneum. 2. Direct extension to the right hepatic lobe from this pleural metastasis not changed from prior. 3. Filling defect within the left renal vein most consistent with bland thrombus. Accessory left retro aortic renal vein on the left.  Electronically Signed: By: Suzy Bouchard M.D. On: 01/28/2015 16:52    ASSESSMENT/PLAN:    Breast cancer of upper-inner  quadrant of left female breast Patiently currently undergoing Cytoxan/methotrexate/fluorouracil chemotherapy on an every 21 day basis.  Patient received cycle 4 of this regimen on 01/21/2015.  She will return for cycle 5 of the same regimen on 02/11/2015.  Patient obtained a restaging CT with contrast of the chest/abdomen/pelvis yesterday afternoon.  Scan revealed a mixed response to therapy.  Patient has plans to return on 02/04/2015 to review scan results in detail with Dr. Jana Hakim.  Also, patient returns for her Neulasta injections tomorrow 01/30/2015 through Saturday, 02/01/2015.  She will return on 02/04/2015 for labs and a follow-up visit.  She also has an appointment with the Coumadin clinic on that same day.   Long term current use of anticoagulant therapy 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  Patient will be initiated today with Lovenox 120 mg subcutaneously daily; as well as Coumadin 7.5 mg on a daily basis.  Patient met with the Coumadin clinic today to obtain a PT/INR.  Patient will return to the [facility] on a daily basis for the rest of this week for her Lovenox injection.  She will be given a Lovenox injection to inject herself on Sunday, 02/02/2015.  Patient will return to the Coumadin clinic on Monday, 02/03/2015.   Renal vein thrombosis 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  Patient will be initiated today with Lovenox 120 mg subcutaneously daily; as well as Coumadin 7.5 mg on a daily basis.  Patient met with the Coumadin clinic today to obtain a PT/INR.  Patient will return to the [facility] on a daily basis for the rest of this week for her Lovenox injection.  She will be given a Lovenox injection to inject herself on Sunday,  02/02/2015.  Patient will return to the Coumadin clinic on Monday, 02/03/2015.     Patient stated understanding of all instructions; and was in agreement with this plan of care. The patient knows to call the clinic with any problems, questions or concerns.   Review/collaboration with Dr. Jana Hakim regarding all aspects of patient's visit today.   Total time spent with patient was 25 minutes;  with greater than 75 percent of that time spent in face to face counseling regarding patient's symptoms,  and coordination of care and follow up.  Disclaimer: This note was dictated with voice recognition software. Similar sounding  words can inadvertently be transcribed and may not be corrected upon review.   Drue Second, NP 01/29/2015

## 2015-01-29 NOTE — Assessment & Plan Note (Addendum)
Patiently currently undergoing Cytoxan/methotrexate/fluorouracil chemotherapy on an every 21 day basis.  Patient received cycle 4 of this regimen on 01/21/2015.  She will return for cycle 5 of the same regimen on 02/11/2015.  Patient obtained a restaging CT with contrast of the chest/abdomen/pelvis yesterday afternoon.  Scan revealed a mixed response to therapy.  Patient has plans to return on 02/04/2015 to review scan results in detail with Dr. Jana Hakim.  Also, patient returns for her Neulasta injections tomorrow 01/30/2015 through Saturday, 02/01/2015.  She will return on 02/04/2015 for labs and a follow-up visit.  She also has an appointment with the Coumadin clinic on that same day.

## 2015-01-30 ENCOUNTER — Ambulatory Visit (HOSPITAL_BASED_OUTPATIENT_CLINIC_OR_DEPARTMENT_OTHER): Payer: No Typology Code available for payment source

## 2015-01-30 DIAGNOSIS — I823 Embolism and thrombosis of renal vein: Secondary | ICD-10-CM

## 2015-01-30 DIAGNOSIS — C50212 Malignant neoplasm of upper-inner quadrant of left female breast: Secondary | ICD-10-CM

## 2015-01-30 DIAGNOSIS — C50919 Malignant neoplasm of unspecified site of unspecified female breast: Secondary | ICD-10-CM

## 2015-01-30 MED ORDER — ENOXAPARIN SODIUM 120 MG/0.8ML ~~LOC~~ SOLN
120.0000 mg | Freq: Once | SUBCUTANEOUS | Status: AC
  Administered 2015-01-30: 120 mg via SUBCUTANEOUS
  Filled 2015-01-30: qty 0.8

## 2015-01-31 ENCOUNTER — Ambulatory Visit (HOSPITAL_BASED_OUTPATIENT_CLINIC_OR_DEPARTMENT_OTHER): Payer: PRIVATE HEALTH INSURANCE

## 2015-01-31 DIAGNOSIS — I823 Embolism and thrombosis of renal vein: Secondary | ICD-10-CM

## 2015-01-31 DIAGNOSIS — C50919 Malignant neoplasm of unspecified site of unspecified female breast: Secondary | ICD-10-CM

## 2015-01-31 DIAGNOSIS — C50212 Malignant neoplasm of upper-inner quadrant of left female breast: Secondary | ICD-10-CM

## 2015-01-31 MED ORDER — ENOXAPARIN SODIUM 120 MG/0.8ML ~~LOC~~ SOLN
120.0000 mg | Freq: Once | SUBCUTANEOUS | Status: AC
  Administered 2015-01-31: 120 mg via SUBCUTANEOUS
  Filled 2015-01-31: qty 0.8

## 2015-02-01 ENCOUNTER — Ambulatory Visit (HOSPITAL_BASED_OUTPATIENT_CLINIC_OR_DEPARTMENT_OTHER): Payer: No Typology Code available for payment source

## 2015-02-01 DIAGNOSIS — I823 Embolism and thrombosis of renal vein: Secondary | ICD-10-CM

## 2015-02-01 DIAGNOSIS — C50919 Malignant neoplasm of unspecified site of unspecified female breast: Secondary | ICD-10-CM

## 2015-02-01 DIAGNOSIS — C50212 Malignant neoplasm of upper-inner quadrant of left female breast: Secondary | ICD-10-CM

## 2015-02-01 MED ORDER — ENOXAPARIN SODIUM 120 MG/0.8ML ~~LOC~~ SOLN
120.0000 mg | Freq: Once | SUBCUTANEOUS | Status: AC
  Administered 2015-02-01: 120 mg via SUBCUTANEOUS

## 2015-02-01 NOTE — Patient Instructions (Signed)
Enoxaparin injection  What is this medicine?  ENOXAPARIN (ee nox a PA rin) is used after knee, hip, or abdominal surgeries to prevent blood clotting. It is also used to treat existing blood clots in the lungs or in the veins.  This medicine may be used for other purposes; ask your health care provider or pharmacist if you have questions.  COMMON BRAND NAME(S): Lovenox  What should I tell my health care provider before I take this medicine?  They need to know if you have any of these conditions:  -bleeding disorders, hemorrhage, or hemophilia  -infection of the heart or heart valves  -kidney or liver disease  -previous stroke  -prosthetic heart valve  -recent surgery or delivery of a baby  -ulcer in the stomach or intestine, diverticulitis, or other bowel disease  -an unusual or allergic reaction to enoxaparin, heparin, pork or pork products, other medicines, foods, dyes, or preservatives  -pregnant or trying to get pregnant  -breast-feeding  How should I use this medicine?  This medicine is for injection under the skin. It is usually given by a health-care professional. You or a family member may be trained on how to give the injections. If you are to give yourself injections, make sure you understand how to use the syringe, measure the dose if necessary, and give the injection. To avoid bruising, do not rub the site where this medicine has been injected. Do not take your medicine more often than directed. Do not stop taking except on the advice of your doctor or health care professional.  Make sure you receive a puncture-resistant container to dispose of the needles and syringes once you have finished with them. Do not reuse these items. Return the container to your doctor or health care professional for proper disposal.  Talk to your pediatrician regarding the use of this medicine in children. Special care may be needed.  Overdosage: If you think you have taken too much of this medicine contact a poison control  center or emergency room at once.  NOTE: This medicine is only for you. Do not share this medicine with others.  What if I miss a dose?  If you miss a dose, take it as soon as you can. If it is almost time for your next dose, take only that dose. Do not take double or extra doses.  What may interact with this medicine?  -aspirin and aspirin-like medicines  -certain medicines that treat or prevent blood clots  -dipyridamole  -NSAIDs, medicines for pain and inflammation, like ibuprofen or naproxen  This list may not describe all possible interactions. Give your health care provider a list of all the medicines, herbs, non-prescription drugs, or dietary supplements you use. Also tell them if you smoke, drink alcohol, or use illegal drugs. Some items may interact with your medicine.  What should I watch for while using this medicine?  Visit your doctor or health care professional for regular checks on your progress. Your condition will be monitored carefully while you are receiving this medicine.  Notify your doctor or health care professional and seek emergency treatment if you develop breathing problems; changes in vision; chest pain; severe, sudden headache; pain, swelling, warmth in the leg; trouble speaking; sudden numbness or weakness of the face, arm, or leg. These can be signs that your condition has gotten worse.  If you are going to have surgery, tell your doctor or health care professional that you are taking this medicine.  Do not stop taking   this medicine without first talking to your doctor. Be sure to refill your prescription before you run out of medicine.  Avoid sports and activities that might cause injury while you are using this medicine. Severe falls or injuries can cause unseen bleeding. Be careful when using sharp tools or knives. Consider using an electric razor. Take special care brushing or flossing your teeth. Report any injuries, bruising, or red spots on the skin to your doctor or health care  professional.  What side effects may I notice from receiving this medicine?  Side effects that you should report to your doctor or health care professional as soon as possible:  -allergic reactions like skin rash, itching or hives, swelling of the face, lips, or tongue  -feeling faint or lightheaded, falls  -signs and symptoms of bleeding such as bloody or black, tarry stools; red or dark-brown urine; spitting up blood or brown material that looks like coffee grounds; red spots on the skin; unusual bruising or bleeding from the eye, gums, or nose  Side effects that usually do not require medical attention (report to your doctor or health care professional if they continue or are bothersome):  -pain, redness, or irritation at site where injected  This list may not describe all possible side effects. Call your doctor for medical advice about side effects. You may report side effects to FDA at 1-800-FDA-1088.  Where should I keep my medicine?  Keep out of the reach of children.  Store at room temperature between 15 and 30 degrees C (59 and 86 degrees F). Do not freeze. If your injections have been specially prepared, you may need to store them in the refrigerator. Ask your pharmacist. Throw away any unused medicine after the expiration date.  NOTE: This sheet is a summary. It may not cover all possible information. If you have questions about this medicine, talk to your doctor, pharmacist, or health care provider.  © 2015, [employer]. (2014-04-02 16:06:21)

## 2015-02-03 ENCOUNTER — Telehealth: Payer: Self-pay | Admitting: Oncology

## 2015-02-03 ENCOUNTER — Other Ambulatory Visit (HOSPITAL_BASED_OUTPATIENT_CLINIC_OR_DEPARTMENT_OTHER): Payer: No Typology Code available for payment source

## 2015-02-03 ENCOUNTER — Ambulatory Visit (HOSPITAL_BASED_OUTPATIENT_CLINIC_OR_DEPARTMENT_OTHER): Payer: PRIVATE HEALTH INSURANCE | Admitting: Pharmacist

## 2015-02-03 DIAGNOSIS — C50212 Malignant neoplasm of upper-inner quadrant of left female breast: Secondary | ICD-10-CM

## 2015-02-03 DIAGNOSIS — I823 Embolism and thrombosis of renal vein: Secondary | ICD-10-CM

## 2015-02-03 DIAGNOSIS — T451X5A Adverse effect of antineoplastic and immunosuppressive drugs, initial encounter: Secondary | ICD-10-CM

## 2015-02-03 DIAGNOSIS — D6481 Anemia due to antineoplastic chemotherapy: Secondary | ICD-10-CM

## 2015-02-03 DIAGNOSIS — C50912 Malignant neoplasm of unspecified site of left female breast: Secondary | ICD-10-CM

## 2015-02-03 LAB — COMPREHENSIVE METABOLIC PANEL (CC13)
ALK PHOS: 122 U/L (ref 40–150)
ALT: 55 U/L (ref 0–55)
ANION GAP: 7 meq/L (ref 3–11)
AST: 71 U/L — ABNORMAL HIGH (ref 5–34)
Albumin: 2.8 g/dL — ABNORMAL LOW (ref 3.5–5.0)
BUN: 6.9 mg/dL — ABNORMAL LOW (ref 7.0–26.0)
CO2: 26 mEq/L (ref 22–29)
CREATININE: 0.7 mg/dL (ref 0.6–1.1)
Calcium: 9.5 mg/dL (ref 8.4–10.4)
Chloride: 109 mEq/L (ref 98–109)
GLUCOSE: 96 mg/dL (ref 70–140)
Potassium: 3.2 mEq/L — ABNORMAL LOW (ref 3.5–5.1)
SODIUM: 142 meq/L (ref 136–145)
Total Bilirubin: 0.25 mg/dL (ref 0.20–1.20)
Total Protein: 7.4 g/dL (ref 6.4–8.3)

## 2015-02-03 LAB — POCT INR: INR: 1.2

## 2015-02-03 LAB — CBC WITH DIFFERENTIAL/PLATELET
BASO%: 0.3 % (ref 0.0–2.0)
BASOS ABS: 0 10*3/uL (ref 0.0–0.1)
EOS ABS: 0.1 10*3/uL (ref 0.0–0.5)
EOS%: 5.1 % (ref 0.0–7.0)
HEMATOCRIT: 26.7 % — AB (ref 34.8–46.6)
HEMOGLOBIN: 8.3 g/dL — AB (ref 11.6–15.9)
LYMPH%: 30.4 % (ref 14.0–49.7)
MCH: 25.7 pg (ref 25.1–34.0)
MCHC: 31.3 g/dL — ABNORMAL LOW (ref 31.5–36.0)
MCV: 82.1 fL (ref 79.5–101.0)
MONO#: 0.1 10*3/uL (ref 0.1–0.9)
MONO%: 9 % (ref 0.0–14.0)
NEUT%: 55.2 % (ref 38.4–76.8)
NEUTROS ABS: 0.9 10*3/uL — AB (ref 1.5–6.5)
Platelets: 177 10*3/uL (ref 145–400)
RBC: 3.25 10*6/uL — ABNORMAL LOW (ref 3.70–5.45)
RDW: 26.1 % — ABNORMAL HIGH (ref 11.2–14.5)
WBC: 1.6 10*3/uL — ABNORMAL LOW (ref 3.9–10.3)
lymph#: 0.5 10*3/uL — ABNORMAL LOW (ref 0.9–3.3)

## 2015-02-03 LAB — PROTIME-INR
INR: 1.2 — ABNORMAL LOW (ref 2.00–3.50)
PROTIME: 14.4 s — AB (ref 10.6–13.4)

## 2015-02-03 NOTE — Progress Notes (Signed)
INR = 1.2   Goal 2-3 INR at baseline. Patient is seen for initial Coumadin clinic visit today. 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 She was treated for this with Lovenox through December 2014. 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 Patient has been on Lovenox 120 mg daily since 2/17, but has not started Coumadin. Progress note 2/17 states patient will be on Coumadin 7.5 mg daily. Will start at this dose. Patient education done today. Erika [name] is aware that she can call us if she has additional questions or problems. She will continue Lovenox 120 mg daily. She will begin Coumadin 7.5 mg daily. She will return on Friday 02/07/15 for lab at 10am and Coumadin clinic at 10:15. She does not yet have a prescription for Coumadin, we will call this in to her drugstore the next time we see her. Lovenox 120 mg x 5 given today. Coumadin samples given:  7.5 mg  #10   Lot: [id number]   Exp:  09/12/15  Erika [name], PharmD

## 2015-02-03 NOTE — Telephone Encounter (Signed)
per Jarvis Newcomer to sch appt-pt aware

## 2015-02-04 ENCOUNTER — Ambulatory Visit (HOSPITAL_BASED_OUTPATIENT_CLINIC_OR_DEPARTMENT_OTHER): Payer: PRIVATE HEALTH INSURANCE | Admitting: Oncology

## 2015-02-04 ENCOUNTER — Telehealth: Payer: Self-pay | Admitting: Oncology

## 2015-02-04 ENCOUNTER — Other Ambulatory Visit: Payer: No Typology Code available for payment source

## 2015-02-04 ENCOUNTER — Other Ambulatory Visit: Payer: PRIVATE HEALTH INSURANCE

## 2015-02-04 ENCOUNTER — Telehealth: Payer: Self-pay | Admitting: *Deleted

## 2015-02-04 VITALS — BP 107/80 | HR 105 | Temp 98.4°F | Resp 18 | Ht 65.0 in | Wt 170.2 lb

## 2015-02-04 DIAGNOSIS — I82409 Acute embolism and thrombosis of unspecified deep veins of unspecified lower extremity: Secondary | ICD-10-CM

## 2015-02-04 DIAGNOSIS — C50212 Malignant neoplasm of upper-inner quadrant of left female breast: Secondary | ICD-10-CM

## 2015-02-04 DIAGNOSIS — I823 Embolism and thrombosis of renal vein: Secondary | ICD-10-CM

## 2015-02-04 DIAGNOSIS — D630 Anemia in neoplastic disease: Secondary | ICD-10-CM

## 2015-02-04 NOTE — Telephone Encounter (Signed)
Per staff message and POF I have scheduled appts. Advised scheduler of appts. JMW  

## 2015-02-04 NOTE — Progress Notes (Signed)
ID: [name] OB: 1971-11-02  MR#: [id number]  CSN#:[id number]  PCP: No PCP Per Patient GYN:   SU: Dr. [name] OTHER MD:  CHIEF COMPLAINT:  Newly metastatic breast cancer CURRENT TREATMENT: [facility],  Coumadin  BREAST CANCER HISTORY: From Dr. [name] original intake note:  "Patient developed a left sided breast mass show up about 6 months prior to diagnosis. 2 months prior this began getting larger rapidly. She was seen in an urgent care where this mass was thought to be an abscess. Apparently there was an attempt to excise or drain this that was not successful and she was referred to Dr. [name] for evaluation. She underwent punch biopsy on 04/23/13 of her skin and the mass. She was then also sent for mm/us. The biopsy shows invasive high grade carcinoma that appears to be breast. It is ER 14%, PR negative, HER-2/neu negative with a Ki-67 that approaches 100%. She has also undergone mm with at least 10 cm upper inner left breast tumor with mild overyling skin thickening. There is also a 3x10 mm cluster of calcs near there also. There is indeterminate 7 mm level 1 left axillary node, she underwent a lymph node biopsy that was positive for disease. No mm evidence of right breast malignancy. An MRI of the breasts was done on 05/22/13 and showed a 14 cm mass in the left breast protruding from the skin and also a 2.5 cm level II axillary node."  Her subsequent history is as detailed below  INTERVAL HISTORY:   Alfreda returns today for follow up of her breast cancer accompanied by her significant other. Today is day 15, cycle 4 of 8 planned cycles of cyclophosphamide, methotrexate, and fluorouracil given every 21 days.  Since her last visit here, the patient had restaging studies which show stable disease. They also show that one of the patient's to left renal veins is thrombosed. She was started on Lovenox Fabry 17 2016 and on Coumadin 02/03/2015. 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 She feels a little tired at times. She never had significant problems with nausea or vomiting. She does have hyperpigmentation chiefly in the palms of her hands but also on her feet. This is embarrassing to her. She hurts boat all over" but her pain is well-controlled on her current medications, including fentanyl 100 g an hour patches and when necessary hydrocodone. She is not constipated from these medications. There have been no unusual headaches, visual changes, dizziness, or gait imbalance.  She takes walks at times and does her housework as her chief form of exercise.A detailed review of systems today was otherwise stable  PAST MEDICAL HISTORY: Past Medical History  Diagnosis Date  . Breast cancer     Invasive High Grade Carcinoma  . Headache(784.0)     Hx: of Migraines  . Status post chemotherapy     Adriamycin and Cytoxan  . Anxiety     ANXIOUS ABOUT HAVEING BIG SURGERY - MASTECTOMY  . 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 DVT (deep venous thrombosis)     RT ARM-PT ON LOVENOX  . Hx of iron deficiency anemia 11/26/13  . S/P radiation therapy 02-04-14 to 03-18-14  1) Left Chest Wall / 50 Gy in 25 fractions/ 2) Left Supraclavicular fossa/ 46 Gy in 23 fractions / 3) Left Posterior Axillary boost / 8.533 Gy in 23 fractions/ 4) Left Chest Wall Scar boost / 10 Gy in 5 fractions    PAST SURGICAL HISTORY: Past Surgical History  Procedure Laterality Date  . Tubal ligation    . Portacath placement Right 05/22/2013    Procedure: INSERTION PORT-A-CATH;  Surgeon: [name], MD;  Location: [place];  Service: General;  Laterality: Right;  . Left breast needle core biopsy Left 05/21/13  . Left breast needle core  biopsy Left 06/22/13  . Mastectomy  modified radical Left 11/15/2013    Procedure: LEFT MASTECTOMY MODIFIED RADICAL;  Surgeon: [name], MD;  Location: [facility];  Service: General;  Laterality: Left;    FAMILY HISTORY Family History  Problem Relation Age of Onset  . Hypertension Mother   . Diabetes Father 72  . Stomach cancer Maternal Aunt   . Hypertension Maternal Grandmother   . Hypertension Maternal Grandfather   . Diabetes Paternal Grandmother   . Diabetes Paternal Grandfather   . Diabetes Paternal Uncle   . Diabetes Paternal Uncle   . Diabetes Paternal Uncle   The patient's mother is alive, age 2. The patient's father died from complications of diabetes at the age of 35. The patient has 2 brothers, one sister. There is no history of breast or ovarian cancer in the family to her knowledge .the patient has not undergone genetic testing   GYNECOLOGIC HISTORY:  Menarche age 17, first live birth age 45. The patient is GX P3. The patient has been on goserelin since 06/13/2013, given monthly  SOCIAL HISTORY:  The patient has worked at as a Furniture conservator/restorer and a Retail buyer. She is currently applying for disability. At home she lives with her youngest son, [name], 68; with the patient's brother Blaise Palladino; and with the patient's fiance [name].   ADVANCED DIRECTIVES: Not in place  HEALTH MAINTENANCE: History  Substance Use Topics  . Smoking status: Never Smoker   . Smokeless tobacco: Never Used  . Alcohol Use: No     Mammogram: 06/22/2013 Colonoscopy: Bone Density Scan:  Pap Smear:  Eye Exam:  Vitamin D Level:   Lipid Panel:    Allergies  Allergen Reactions  . Doxycycline Nausea And Vomiting    Current Outpatient Prescriptions  Medication Sig Dispense Refill  . cyclobenzaprine (FLEXERIL) 5 MG tablet TAKE 1 TABLET BY MOUTH THREE TIMES A DAY FOR MUSCLE SPASMS 30 tablet 1  . fentaNYL (DURAGESIC - DOSED MCG/HR) 25 MCG/HR patch Place 1 patch (25 mcg total) onto the skin every 3 (three) days. 10  patch 0  . fentaNYL (DURAGESIC - DOSED MCG/HR) 75 MCG/HR Place 1 patch (75 mcg total) onto the skin every 3 (three) days. 10 patch 0  . gabapentin (NEURONTIN) 300 MG capsule Take 1 capsule (300 mg total) by mouth 3 (three) times daily. 90 capsule 4  . HYDROcodone-homatropine (HYCODAN) 5-1.5 MG/5ML syrup Take 5 mLs by mouth every 6 (six) hours as needed for cough. (Patient not taking: Reported on 01/28/2015) 240 mL 0  . loratadine (CLARITIN) 10 MG tablet Take 10 mg by mouth daily as needed for allergies.    Marland Kitchen LORazepam (ATIVAN) 0.5 MG tablet Take 1 tablet (0.5 mg total) by mouth every 6 (six) hours as needed for anxiety. 30 tablet 0  . oxyCODONE (OXY IR/ROXICODONE) 5 MG immediate release tablet Take 1 tablet (5 mg  total) by mouth every 8 (eight) hours as needed for severe pain or breakthrough pain. 90 tablet 0  . potassium chloride SA (K-DUR,KLOR-CON) 20 MEQ tablet Take 1 tablet (20 mEq total) by mouth 4 (four) times daily. 120 tablet 1  . PRESCRIPTION MEDICATION PT REPORTS RECEIVING A SHOT ONCE A MONTH AT CANCER CENTER TO STOP HER MENSTRUAL PERIODS - I SEE DOCUMENTATION IN OFFICE NOTES FROM CANCER CENTER THAT PT WAS GETTING ZOLADEX SQ EVERY 28 DAYS.    Marland Kitchen traMADol (ULTRAM) 50 MG tablet Take 1-2 tablets (50-100 mg total) by mouth every 6 (six) hours as needed. 30 tablet 1  . UNABLE TO FIND Rx: L8000- Post Surgical Bra (Quantity: 6) F6812- Non Silicone Breast prosthesis (Quantity: 1) Dx: 174.9; Left mastectomy 1 each 0  . warfarin (COUMADIN) 7.5 MG tablet Take 1 tablet (7.5 mg total) by mouth daily. 30 tablet 0  . zolpidem (AMBIEN) 5 MG tablet Take 1 tablet (5 mg total) by mouth at bedtime as needed for sleep. 30 tablet 0   No current facility-administered medications for this visit.   Facility-Administered Medications Ordered in Other Visits  Medication Dose Route Frequency Provider Last Rate Last Dose  . topical emolient (BIAFINE) emulsion   Topical Daily [name], MD        OBJECTIVE:  middle-aged African American woman who appears stated age 44 Vitals:   02/04/15 1049  BP: 107/80  Pulse: 105  Temp: 98.4 F (36.9 C)  Resp: 18     Body mass index is 28.32 kg/(m^2).      ECOG FS:1 - Symptomatic but completely ambulatory   Sclerae unicteric, pupils equal and reactive Oropharynx clear and moist No cervical or supraclavicular adenopathy Lungs no rales or rhonchi Heart regular rate and rhythm Abd soft, nontender, positive bowel sounds MSK no focal spinal tenderness, no upper extremity lymphedema Neuro: nonfocal, well oriented, appropriate affect Breasts: deferred   LAB RESULTS:  CMP     Component Value Date/Time   NA 142 02/03/2015 1043   NA 138 11/16/2013 0448   K 3.2* 02/03/2015 1043   K 3.5 11/16/2013 0448   CL 103 11/16/2013 0448   CL 102 06/01/2013 1500   CO2 26 02/03/2015 1043   CO2 27 11/16/2013 0448   GLUCOSE 96 02/03/2015 1043   GLUCOSE 116* 11/16/2013 0448   GLUCOSE 95 06/01/2013 1500   BUN 6.9* 02/03/2015 1043   BUN 8 11/16/2013 0448   CREATININE 0.7 02/03/2015 1043   CREATININE 0.86 11/16/2013 0448   CALCIUM 9.5 02/03/2015 1043   CALCIUM 8.9 11/16/2013 0448   PROT 7.4 02/03/2015 1043   PROT 8.2 05/18/2013 1205   ALBUMIN 2.8* 02/03/2015 1043   ALBUMIN 2.8* 05/18/2013 1205   AST 71* 02/03/2015 1043   AST 18 05/18/2013 1205   ALT 55 02/03/2015 1043   ALT 16 05/18/2013 1205   ALKPHOS 122 02/03/2015 1043   ALKPHOS 111 05/18/2013 1205   BILITOT 0.25 02/03/2015 1043   BILITOT 0.2* 05/18/2013 1205   GFRNONAA 82* 11/16/2013 0448   GFRAA >90 11/16/2013 0448    I No results found for: SPEP  Lab Results  Component Value Date   WBC 1.6* 02/03/2015   NEUTROABS 0.9* 02/03/2015   HGB 8.3* 02/03/2015   HCT 26.7* 02/03/2015   MCV 82.1 02/03/2015   PLT 177 02/03/2015      Chemistry      Component Value Date/Time   NA 142 02/03/2015 1043   NA 138 11/16/2013 0448   K  3.2* 02/03/2015 1043   K 3.5 11/16/2013 0448   CL 103  11/16/2013 0448   CL 102 06/01/2013 1500   CO2 26 02/03/2015 1043   CO2 27 11/16/2013 0448   BUN 6.9* 02/03/2015 1043   BUN 8 11/16/2013 0448   CREATININE 0.7 02/03/2015 1043   CREATININE 0.86 11/16/2013 0448      Component Value Date/Time   CALCIUM 9.5 02/03/2015 1043   CALCIUM 8.9 11/16/2013 0448   ALKPHOS 122 02/03/2015 1043   ALKPHOS 111 05/18/2013 1205   AST 71* 02/03/2015 1043   AST 18 05/18/2013 1205   ALT 55 02/03/2015 1043   ALT 16 05/18/2013 1205   BILITOT 0.25 02/03/2015 1043   BILITOT 0.2* 05/18/2013 1205       Lab Results  Component Value Date   LABCA2 35 05/18/2013    No components found for: LABCA125   Recent Labs Lab 02/03/15 1046  INR 1.20*    Urinalysis No results found for: COLORURINE  STUDIES: Ct Chest W Contrast  01/28/2015   ADDENDUM REPORT: 01/28/2015 17:35  ADDENDUM: Thrombus measures 36 mm along the left renal vein and 12 mm in thickness. Thrombus mildly expands the vein.  Findings conveyed toFeng, MDon 01/28/2015  at17:35.   Electronically Signed   By: Suzy Bouchard M.D.   On: 01/28/2015 17:35   01/28/2015   CLINICAL DATA:  Left breast cancer subsequent treatment strategy. Chemotherapy in progress. Radiation therapy complete. 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  EXAM: CT CHEST, ABDOMEN, AND PELVIS WITH CONTRAST  TECHNIQUE: Multidetector CT imaging of the chest, abdomen and pelvis was performed following the standard protocol during bolus administration of intravenous contrast.  CONTRAST:  12m OMNIPAQUE IOHEXOL 300 MG/ML  SOLN  COMPARISON:  11/04/2014  FINDINGS: CT CHEST FINDINGS  Mediastinum/Nodes: No axillary supraclavicular lymphadenopathy. No mediastinal hilar lymphadenopathy. No pericardial fluid.  Lungs/Pleura: There is a rind of pleural thickening with the right hemi thorax with multiple nodules consists with metastatic disease. The rind like nodular thickening occupies the entirety of the pleural  surface. There does appear mild improvement of the nodularity. For example nodule along the right bronchus intermedius measures 12 mm short axis compared to 24 mm on prior (image 27, series 2). Nodule along the right upper mediastinal pleural surface measures 12 x 8 mm compared to 17 x 9 mm. The nodule pleural thickening along the oblique fissure inferiorly. For example 21 mm nodule on image 37, series is 2 compares to 24 mm on prior. Nodule in the right upper lobe abutting pleural surface measures 20 mm compared to 18 mm on prior. Small pericardial effusion. The right left lung is clear disease.  Musculoskeletal: No aggressive osseous lesion.  CT ABDOMEN AND PELVIS FINDINGS  Hepatobiliary: There is extension of the right pleural metastasis deep into the sulcus on the right with nodularity along the right abdomen wall. Direct extension into the liver parenchyma from this deep pleural metastasis. Metastatic lesion measures 29 x 21 mm in the right hepatic lobe compared to 32 x 20 mm on prior. There is benign fatty infiltration along the falciform ligament.  There are multiple irregular gallstones in the gallbladder. The largest measures 17 mm.  Pancreas: Pancreas is normal. No ductal dilatation. No pancreatic inflammation.  Spleen: Normal spleen.  Adrenals/Urinary Tract: Adrenal glands are normal. Kidneys are ureters bladder normal.  Stomach/Bowel: Stomach, small bowel, and cecum normal. The colon and rectosigmoid colon are normal.  Vascular/Lymphatic: Abdominal or is normal caliber. There  is filling defect within the left renal vein on the portal venous phase of imaging. This persists on the delayed renal phase imaging (image 17, series 4. There is a second left retro aortic renal vein which is patent. The kidneys enhance symmetrically.  No retroperitoneal periportal lymphadenopathy. No pelvic lymphadenopathy  Reproductive: Uterus and ovaries are normal.  Other: No free-fluid pattern pelvis. No peritoneal disease or  omental metastasis  Musculoskeletal: Aggressive osseous lesion.  IMPRESSION: Chest Impression:  1. 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 Some lesions measures slightly smaller while others measures slightly larger. No evidence of progression per se. 2. No mediastinal lymphadenopathy  Abdomen / Pelvis Impression:  1. Extension of the pleural metastasis deep into the sulcus of the right lateral pleural space. Nodularity along the right lateral abdominal wall consistent metastatic extension into the peritoneum. 2. Direct extension to the right hepatic lobe from this pleural metastasis not changed from prior. 3. Filling defect within the left renal vein most consistent with bland thrombus. Accessory left retro aortic renal vein on the left.  Electronically Signed: By: Suzy Bouchard M.D. On: 01/28/2015 16:52   Ct Abdomen Pelvis W Contrast  01/28/2015   ADDENDUM REPORT: 01/28/2015 17:35  ADDENDUM: Thrombus measures 36 mm along the left renal vein and 12 mm in thickness. Thrombus mildly expands the vein.  Findings conveyed toFeng, MDon 01/28/2015  at17:35.   Electronically Signed   By: Suzy Bouchard M.D.   On: 01/28/2015 17:35   01/28/2015   CLINICAL DATA:  Left breast cancer subsequent treatment strategy. Chemotherapy in progress. Radiation therapy complete. 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  EXAM: CT CHEST, ABDOMEN, AND PELVIS WITH CONTRAST  TECHNIQUE: Multidetector CT imaging of the chest, abdomen and pelvis was performed following the standard protocol during bolus administration of intravenous contrast.  CONTRAST:  113m OMNIPAQUE IOHEXOL 300 MG/ML  SOLN  COMPARISON:  11/04/2014  FINDINGS: CT CHEST FINDINGS  Mediastinum/Nodes: No axillary supraclavicular lymphadenopathy. No mediastinal hilar lymphadenopathy. No pericardial fluid.  Lungs/Pleura: There is a rind of pleural thickening with the right hemi  thorax with multiple nodules consists with metastatic disease. The rind like nodular thickening occupies the entirety of the pleural surface. There does appear mild improvement of the nodularity. For example nodule along the right bronchus intermedius measures 12 mm short axis compared to 24 mm on prior (image 27, series 2). Nodule along the right upper mediastinal pleural surface measures 12 x 8 mm compared to 17 x 9 mm. The nodule pleural thickening along the oblique fissure inferiorly. For example 21 mm nodule on image 37, series is 2 compares to 24 mm on prior. Nodule in the right upper lobe abutting pleural surface measures 20 mm compared to 18 mm on prior. Small pericardial effusion. The right left lung is clear disease.  Musculoskeletal: No aggressive osseous lesion.  CT ABDOMEN AND PELVIS FINDINGS  Hepatobiliary: There is extension of the right pleural metastasis deep into the sulcus on the right with nodularity along the right abdomen wall. Direct extension into the liver parenchyma from this deep pleural metastasis. Metastatic lesion measures 29 x 21 mm in the right hepatic lobe compared to 32 x 20 mm on prior. There is benign fatty infiltration along the falciform ligament.  There are multiple irregular gallstones in the gallbladder. The largest measures 17 mm.  Pancreas: Pancreas is normal. No ductal dilatation. No pancreatic inflammation.  Spleen: Normal spleen.  Adrenals/Urinary Tract:  Adrenal glands are normal. Kidneys are ureters bladder normal.  Stomach/Bowel: Stomach, small bowel, and cecum normal. The colon and rectosigmoid colon are normal.  Vascular/Lymphatic: Abdominal or is normal caliber. There is filling defect within the left renal vein on the portal venous phase of imaging. This persists on the delayed renal phase imaging (image 17, series 4. There is a second left retro aortic renal vein which is patent. The kidneys enhance symmetrically.  No retroperitoneal periportal lymphadenopathy. No  pelvic lymphadenopathy  Reproductive: Uterus and ovaries are normal.  Other: No free-fluid pattern pelvis. No peritoneal disease or omental metastasis  Musculoskeletal: Aggressive osseous lesion.  IMPRESSION: Chest Impression:  1. 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 Some lesions measures slightly smaller while others measures slightly larger. No evidence of progression per se. 2. No mediastinal lymphadenopathy  Abdomen / Pelvis Impression:  1. Extension of the pleural metastasis deep into the sulcus of the right lateral pleural space. Nodularity along the right lateral abdominal wall consistent metastatic extension into the peritoneum. 2. Direct extension to the right hepatic lobe from this pleural metastasis not changed from prior. 3. Filling defect within the left renal vein most consistent with bland thrombus. Accessory left retro aortic renal vein on the left.  Electronically Signed: By: Suzy Bouchard M.D. On: 01/28/2015 16:52    ASSESSMENT: 44 y.o.  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  (0) genetic testing of the ATM, BARD1, BRCA1, BRCA2, BRIP1, CDH1, CHEK2, EPCAM, FANCC, MLH1, MSH2, MSH6, NBN, PALB2, PMS2, PTEN, RAD51C, RAD51D, STK11, TP53, and XRCC2 genes showed no deleterious mutations  (1) status post left breast biopsy 04/23/2013 for a clinical T4 N1, stage IIIB invasive ductal carcinoma, grade 3,  estrogen receptor 14% positive with moderate staining intensity, progesterone receptor negative,  with an MIB-1 of  100% and HER-2/neu  nonamplified  (2) biopsy of a left axillary lymph node [phone] was positive  (3) started monthly goserelin July of 02/01/2013  (4)  received  neoadjuvant chemotherapy with doxorubicin and cyclophosphamide in dose dense fashion x4, followed by carboplatin and paclitaxel x1, discontinued due to neuropathy, followed by carboplatin and gemcitabine,  both given day 1 of each 14 day cycle x6, completed 10/12/2013 .   (5) right upper extremity DVT documented 06/29/2013,  treated with Lovenox from  July  through  December 2014    (6) status post left modified radical mastectomy 11/15/2013 for a residual pT3 pN0 invasive ductal carcinoma, grade 3, with repeat prognostic panel triple negative. All 12 axillary lymph nodes were clear. Margins were negative   (5) completed adjuvant radiation therapy 03/18/2014,  with capecitabine added 2-3 weeks into treatment.  METASTATIC DISEASE  (6) CT scans of the chest, abdomen, and pelvis 06/26/2014 showed a large right pleural effusion  with pleural studding, 2 left upper lobe nodules, and two  liver lesions. Cytology from right thoracentesis 07/03/2014 showed malignant cells consistent with the patient's known primary  (7) started eribulin 07/09/2014, given days 1 and 8 of each 21 day cycle; restagind studies after cycle 3 showed evidence of response. Discontinued after cycle 5 (last dose 10/08/14) because of worsening peripheral neuropathy--also with progression.   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 As far as her metastatic breast cancer is concerned, she has received 4 cycles of CMF. She has tolerated them well. The scans show stable disease. Were going to repeat 4 more cycles and then again restage.  We will see her every 3 weeks, at the time of each treatment, with the accompanying lab work.   Her left renal vein  thrombosis her second clot and I will obtain a portion of a hypercoagulable panel to see if we are dealing with an inherited thrombotic tendency. She will be on Coumadin a minimum of 6 months , but possibly lifelong depending on those results.    today we discussed the mechanism of action of Coumadin, and I have advised her to eat her regular diet. We will adjust her Coumadin dose to her diet instead of adjusting the diet to the dose. This usually results in more stable results. She understands that if she has diarrhea or takes antibiotics we may need to monitor her more closely.   Otherwise she will return for her next treatment March 1, and her next appointment 3 weeks after that. I have entered the orders for chemotherapy and also for her restaging studies in 3 months.   She has a good understanding of the overall plan. She agrees with it. She knows the goal of treatment in her case is control. She will call with any problems that may develop before her next visit here. [name], [place] [phone] 02/04/2015 11:00 AM

## 2015-02-04 NOTE — Addendum Note (Signed)
Addended by: Laureen Abrahams on: 02/04/2015 05:11 PM   Modules accepted: Medications

## 2015-02-04 NOTE — Telephone Encounter (Signed)
, °

## 2015-02-07 ENCOUNTER — Ambulatory Visit (HOSPITAL_BASED_OUTPATIENT_CLINIC_OR_DEPARTMENT_OTHER): Payer: PRIVATE HEALTH INSURANCE | Admitting: Pharmacist

## 2015-02-07 ENCOUNTER — Other Ambulatory Visit (HOSPITAL_BASED_OUTPATIENT_CLINIC_OR_DEPARTMENT_OTHER): Payer: PRIVATE HEALTH INSURANCE

## 2015-02-07 ENCOUNTER — Ambulatory Visit: Payer: PRIVATE HEALTH INSURANCE

## 2015-02-07 DIAGNOSIS — I823 Embolism and thrombosis of renal vein: Secondary | ICD-10-CM

## 2015-02-07 DIAGNOSIS — D6481 Anemia due to antineoplastic chemotherapy: Secondary | ICD-10-CM

## 2015-02-07 DIAGNOSIS — C50912 Malignant neoplasm of unspecified site of left female breast: Secondary | ICD-10-CM

## 2015-02-07 DIAGNOSIS — D630 Anemia in neoplastic disease: Secondary | ICD-10-CM

## 2015-02-07 DIAGNOSIS — T451X5A Adverse effect of antineoplastic and immunosuppressive drugs, initial encounter: Secondary | ICD-10-CM

## 2015-02-07 DIAGNOSIS — C50212 Malignant neoplasm of upper-inner quadrant of left female breast: Secondary | ICD-10-CM

## 2015-02-07 LAB — PROTIME-INR
INR: 4.8 — AB (ref 2.00–3.50)
Protime: 57.6 Seconds — ABNORMAL HIGH (ref 10.6–13.4)

## 2015-02-07 LAB — POCT INR: INR: 4.8

## 2015-02-07 NOTE — Progress Notes (Signed)
INR = 4.8 today.  Pt took her 1st dose of Coumadin just 4 days ago.  She has been on 7.5 mg daily since then without missing doses. Pt was accompanied during visit today by her significant other, Erika [name]. She is also on Lovenox until INR at goal. No bleeding/bruising. Pt understands diet & Coumadin tx.  She mentions some herbals she takes: Nutraburst - 1 Tablespoon weekly.  This has a variety of vitamins (vit E/B12, etc) and a "proprietary blend" of herbs (ex. Cocoa, green tea extract, cinnamon, citrus aurantium).  She also is considering starting Chaga (mushroom) and Ganoderm lucidum as immune stimulants to help fight her cancer. Many of the ingredients in these supplements increase risk of bleeding.  I advised her not to use ANY of these supplements until we are able to get her maintained on a stable dose of Coumadin.  She should particularly avoid them today/next week.  Some of them interact w/ her chemo from an immunity standpoint, so she will not take those during her chemo weeks. She agreed to take no more than 1.5 g/day or Ganoderm lucidum, IF she started it.  Doses > 1.5 g/day were associated with greater bleeding risk. Pt stated, "I just need to get off Coumadin so I can take these immune supplements."  I reminded her of the greater risk of re-thrombosis if she were to go off Coumadin at this stage of the game.  She understood her risks. INR quickly supratherapeutic (INR = 1.2 just 4 days ago).  Pt has low albumin, states she has lost 60 lbs or more in the past year or so.  I think 7.5 mg daily of Coumadin will be too much for her so I've backed off her Coumadin dose. She will not take Coumadin for the next 2 days.  On Sunday, she will begin 5 mg daily except 7.5 mg on Mon/Wed. Stop Lovenox. She gets next cycle of CMF on 02/11/15 so we'll see her in the infusion room that day. Pt understands she will need to be flexible w/ her dosing of Coumadin initially until we find the right dose to keep her  in goal range. She has 6 more Coumadin 7.5 mg tabs left from the supply given on 2/22, but I gave her some 5 mg tabs today as well. Samples: Coumadin 5 mg x 10 tabs (lot# [id number]; exp 03/2016) Erika [name], Pharm.D., CPP 02/07/2015@12 :34 PM

## 2015-02-11 ENCOUNTER — Ambulatory Visit (HOSPITAL_BASED_OUTPATIENT_CLINIC_OR_DEPARTMENT_OTHER): Payer: Self-pay | Admitting: Pharmacist

## 2015-02-11 ENCOUNTER — Other Ambulatory Visit (HOSPITAL_BASED_OUTPATIENT_CLINIC_OR_DEPARTMENT_OTHER): Payer: PRIVATE HEALTH INSURANCE

## 2015-02-11 ENCOUNTER — Other Ambulatory Visit (HOSPITAL_COMMUNITY)
Admission: RE | Admit: 2015-02-11 | Discharge: 2015-02-11 | Disposition: A | Discharge status: Home / Self Care | Payer: PRIVATE HEALTH INSURANCE | Source: Ambulatory Visit | Attending: Oncology | Admitting: Oncology

## 2015-02-11 ENCOUNTER — Ambulatory Visit (HOSPITAL_BASED_OUTPATIENT_CLINIC_OR_DEPARTMENT_OTHER): Payer: PRIVATE HEALTH INSURANCE

## 2015-02-11 ENCOUNTER — Other Ambulatory Visit: Payer: Self-pay | Admitting: *Deleted

## 2015-02-11 DIAGNOSIS — C50912 Malignant neoplasm of unspecified site of left female breast: Secondary | ICD-10-CM

## 2015-02-11 DIAGNOSIS — C50212 Malignant neoplasm of upper-inner quadrant of left female breast: Secondary | ICD-10-CM

## 2015-02-11 DIAGNOSIS — G893 Neoplasm related pain (acute) (chronic): Secondary | ICD-10-CM

## 2015-02-11 DIAGNOSIS — D630 Anemia in neoplastic disease: Secondary | ICD-10-CM

## 2015-02-11 DIAGNOSIS — I823 Embolism and thrombosis of renal vein: Secondary | ICD-10-CM

## 2015-02-11 DIAGNOSIS — T451X5A Adverse effect of antineoplastic and immunosuppressive drugs, initial encounter: Secondary | ICD-10-CM

## 2015-02-11 DIAGNOSIS — D6481 Anemia due to antineoplastic chemotherapy: Secondary | ICD-10-CM

## 2015-02-11 DIAGNOSIS — Z5111 Encounter for antineoplastic chemotherapy: Secondary | ICD-10-CM

## 2015-02-11 DIAGNOSIS — E876 Hypokalemia: Secondary | ICD-10-CM

## 2015-02-11 DIAGNOSIS — Z7901 Long term (current) use of anticoagulants: Secondary | ICD-10-CM | POA: Insufficient documentation

## 2015-02-11 DIAGNOSIS — I82409 Acute embolism and thrombosis of unspecified deep veins of unspecified lower extremity: Secondary | ICD-10-CM

## 2015-02-11 LAB — COMPREHENSIVE METABOLIC PANEL (CC13)
ALBUMIN: 2.8 g/dL — AB (ref 3.5–5.0)
ALK PHOS: 138 U/L (ref 40–150)
ALT: 16 U/L (ref 0–55)
AST: 21 U/L (ref 5–34)
Anion Gap: 10 mEq/L (ref 3–11)
BUN: 13.6 mg/dL (ref 7.0–26.0)
CO2: 25 mEq/L (ref 22–29)
Calcium: 9.6 mg/dL (ref 8.4–10.4)
Chloride: 105 mEq/L (ref 98–109)
Creatinine: 0.8 mg/dL (ref 0.6–1.1)
EGFR: >90 mL/min/{1.73_m2} (ref >90)
Glucose: 86 mg/dl (ref 70–140)
POTASSIUM: 4.1 meq/L (ref 3.5–5.1)
SODIUM: 141 meq/L (ref 136–145)
Total Bilirubin: <0.2 mg/dL (ref 0.20–1.20)
Total Protein: 7.7 g/dL (ref 6.4–8.3)

## 2015-02-11 LAB — PROTIME-INR
INR: 3.87 — AB (ref 0.00–1.49)
Prothrombin Time: 38.3 seconds — ABNORMAL HIGH (ref 11.6–15.2)

## 2015-02-11 LAB — CBC WITH DIFFERENTIAL/PLATELET
BASO%: 0.2 % (ref 0.0–2.0)
BASOS ABS: 0 10*3/uL (ref 0.0–0.1)
EOS ABS: 0.2 10*3/uL (ref 0.0–0.5)
EOS%: 3.2 % (ref 0.0–7.0)
HCT: 29.2 % — ABNORMAL LOW (ref 34.8–46.6)
HGB: 9 g/dL — ABNORMAL LOW (ref 11.6–15.9)
LYMPH%: 21.7 % (ref 14.0–49.7)
MCH: 26.5 pg (ref 25.1–34.0)
MCHC: 30.8 g/dL — ABNORMAL LOW (ref 31.5–36.0)
MCV: 86.1 fL (ref 79.5–101.0)
MONO#: 0.6 10*3/uL (ref 0.1–0.9)
MONO%: 11.3 % (ref 0.0–14.0)
NEUT%: 63.6 % (ref 38.4–76.8)
NEUTROS ABS: 3.6 10*3/uL (ref 1.5–6.5)
Platelets: 463 10*3/uL — ABNORMAL HIGH (ref 145–400)
RBC: 3.39 10*6/uL — AB (ref 3.70–5.45)
RDW: 25.8 % — AB (ref 11.2–14.5)
WBC: 5.7 10*3/uL (ref 3.9–10.3)
lymph#: 1.2 10*3/uL (ref 0.9–3.3)

## 2015-02-11 LAB — POCT INR: INR: 3.87

## 2015-02-11 MED ORDER — DEXAMETHASONE SODIUM PHOSPHATE 10 MG/ML IJ SOLN
10.0000 mg | Freq: Once | INTRAMUSCULAR | Status: AC
  Administered 2015-02-11: 10 mg via INTRAVENOUS

## 2015-02-11 MED ORDER — HEPARIN SOD (PORK) LOCK FLUSH 100 UNIT/ML IV SOLN
500.0000 [IU] | Freq: Once | INTRAVENOUS | Status: AC | PRN
  Administered 2015-02-11: 500 [IU]
  Filled 2015-02-11: qty 5

## 2015-02-11 MED ORDER — METHOTREXATE SODIUM CHEMO INJECTION 25 MG/ML
40.0000 mg/m2 | Freq: Once | INTRAMUSCULAR | Status: AC
Start: 2015-02-11 — End: 2015-02-11
  Administered 2015-02-11: 80 mg via INTRAVENOUS
  Filled 2015-02-11: qty 3.2

## 2015-02-11 MED ORDER — DEXAMETHASONE SODIUM PHOSPHATE 10 MG/ML IJ SOLN
INTRAMUSCULAR | Status: AC
  Filled 2015-02-11: qty 1

## 2015-02-11 MED ORDER — SODIUM CHLORIDE 0.9 % IV SOLN
Freq: Once | INTRAVENOUS | Status: AC
  Administered 2015-02-11: 10:00:00 via INTRAVENOUS

## 2015-02-11 MED ORDER — SODIUM CHLORIDE 0.9 % IJ SOLN
10.0000 mL | INTRAMUSCULAR | Status: DC | PRN
  Administered 2015-02-11: 10 mL
  Filled 2015-02-11: qty 10

## 2015-02-11 MED ORDER — SODIUM CHLORIDE 0.9 % IV SOLN
600.0000 mg/m2 | Freq: Once | INTRAVENOUS | Status: AC
  Administered 2015-02-11: 1180 mg via INTRAVENOUS
  Filled 2015-02-11: qty 59

## 2015-02-11 MED ORDER — ONDANSETRON 8 MG/50ML IVPB (CHCC)
8.0000 mg | Freq: Once | INTRAVENOUS | Status: AC
  Administered 2015-02-11: 8 mg via INTRAVENOUS

## 2015-02-11 MED ORDER — ONDANSETRON 8 MG/NS 50 ML IVPB
INTRAVENOUS | Status: AC
  Filled 2015-02-11: qty 8

## 2015-02-11 MED ORDER — FLUOROURACIL CHEMO INJECTION 2.5 GM/50ML
600.0000 mg/m2 | Freq: Once | INTRAVENOUS | Status: AC
  Administered 2015-02-11: 1200 mg via INTRAVENOUS
  Filled 2015-02-11: qty 24

## 2015-02-11 MED ORDER — FENTANYL 25 MCG/HR TD PT72: 25.0000 ug | MEDICATED_PATCH | TRANSDERMAL | Status: DC

## 2015-02-11 MED ORDER — OXYCODONE HCL 5 MG PO TABS: 5.0000 mg | ORAL_TABLET | Freq: Three times a day (TID) | ORAL | Status: DC | PRN

## 2015-02-11 MED ORDER — FENTANYL 75 MCG/HR TD PT72: 75.0000 ug | MEDICATED_PATCH | TRANSDERMAL | Status: DC

## 2015-02-11 NOTE — Patient Instructions (Signed)
INR above goal No coumadin today (3/1) Then begin on Wednesday Coumadin 5 mg daily  Recheck next week on 3/8 (tuesday) at 10:30am for lab and 10:45am for coumadin clinic

## 2015-02-11 NOTE — Patient Instructions (Addendum)
[facility] Discharge Instructions for Patients Receiving Chemotherapy  Today you received the following chemotherapy agents: Methotrexate, Cytoxan and Fluorouracil.  To help prevent nausea and vomiting after your treatment, we encourage you to take your nausea medication: Zofran. Take one every 12 hours for 3 days. Begin the morning of 02/12/15.   If you develop nausea and vomiting that is not controlled by your nausea medication, call the clinic.   BELOW ARE SYMPTOMS THAT SHOULD BE REPORTED IMMEDIATELY:  *FEVER GREATER THAN 100.5 F  *CHILLS WITH OR WITHOUT FEVER  NAUSEA AND VOMITING THAT IS NOT CONTROLLED WITH YOUR NAUSEA MEDICATION  *UNUSUAL SHORTNESS OF BREATH  *UNUSUAL BRUISING OR BLEEDING  TENDERNESS IN MOUTH AND THROAT WITH OR WITHOUT PRESENCE OF ULCERS  *URINARY PROBLEMS  *BOWEL PROBLEMS  UNUSUAL RASH Items with * indicate a potential emergency and should be followed up as soon as possible.  Feel free to call the clinic should you have any questions or concerns. The clinic phone number is [phone].

## 2015-02-11 NOTE — Progress Notes (Signed)
INR above goal today at 3.87 Pt seen in infusion area Pt is doing well with no complaints She stated she has not taking the immune supplements Pt took coumadin as instructed last week (skipped Friday and Saturday and took 5 mg on Sunday and 7.5 mg on Monday) No unusual bleeding or bruising No other diet or medication changes Will try pt on 5 mg daily for this week and recheck next week.  Plan: No coumadin today (3/1) Then begin on Wednesday Coumadin 5 mg daily  Recheck next week on 3/8 (tuesday) at 10:30am for lab and 10:45am for coumadin clinic  Samples provided:  Coumadin 5 mg x 10 tabs LOT: [id number] Exp: 4/17

## 2015-02-12 ENCOUNTER — Telehealth: Payer: Self-pay | Admitting: Oncology

## 2015-02-12 NOTE — Telephone Encounter (Signed)
per Gerald Stabs in [place] pt aware

## 2015-02-14 ENCOUNTER — Encounter: Payer: Self-pay | Admitting: Oncology

## 2015-02-14 LAB — PROTHROMBIN GENE MUTATION

## 2015-02-14 LAB — ANTITHROMBIN III: AntiThromb III Func: 122 % (ref 76–126)

## 2015-02-14 LAB — LUPUS ANTICOAGULANT PANEL
DRVVT 1:1 Mix: 38.3 secs (ref <42.9)
DRVVT: 49.6 s — AB (ref <42.9)
Lupus Anticoagulant: NOT DETECTED
PTT Lupus Anticoagulant: 66.7 secs — ABNORMAL HIGH (ref 28.0–43.0)
PTTLA 4:1 Mix: 50 secs — ABNORMAL HIGH (ref 28.0–43.0)
PTTLA Confirmation: 6.3 secs (ref <8.0)

## 2015-02-14 LAB — FACTOR 5 LEIDEN

## 2015-02-14 NOTE — Progress Notes (Signed)
Placed pre-auth form for   fentaNYL (DURAGESIC - DOSED MCG/HR) 25 MCG/HR patch [[id number]]      On nurse's desk for Dr. Jana Hakim.

## 2015-02-18 ENCOUNTER — Other Ambulatory Visit: Payer: Self-pay | Admitting: *Deleted

## 2015-02-18 ENCOUNTER — Other Ambulatory Visit (HOSPITAL_COMMUNITY)
Admission: RE | Admit: 2015-02-18 | Discharge: 2015-02-18 | Disposition: A | Discharge status: Home / Self Care | Payer: PRIVATE HEALTH INSURANCE | Source: Ambulatory Visit | Attending: Oncology | Admitting: Oncology

## 2015-02-18 ENCOUNTER — Other Ambulatory Visit (HOSPITAL_BASED_OUTPATIENT_CLINIC_OR_DEPARTMENT_OTHER): Payer: PRIVATE HEALTH INSURANCE

## 2015-02-18 ENCOUNTER — Encounter: Payer: Self-pay | Admitting: Oncology

## 2015-02-18 ENCOUNTER — Ambulatory Visit: Payer: PRIVATE HEALTH INSURANCE | Admitting: Pharmacist

## 2015-02-18 ENCOUNTER — Encounter: Payer: Self-pay | Admitting: *Deleted

## 2015-02-18 DIAGNOSIS — C50912 Malignant neoplasm of unspecified site of left female breast: Secondary | ICD-10-CM

## 2015-02-18 DIAGNOSIS — I82409 Acute embolism and thrombosis of unspecified deep veins of unspecified lower extremity: Secondary | ICD-10-CM | POA: Diagnosis present

## 2015-02-18 DIAGNOSIS — D630 Anemia in neoplastic disease: Secondary | ICD-10-CM

## 2015-02-18 DIAGNOSIS — Z86718 Personal history of other venous thrombosis and embolism: Secondary | ICD-10-CM

## 2015-02-18 DIAGNOSIS — C773 Secondary and unspecified malignant neoplasm of axilla and upper limb lymph nodes: Secondary | ICD-10-CM

## 2015-02-18 DIAGNOSIS — T451X5A Adverse effect of antineoplastic and immunosuppressive drugs, initial encounter: Secondary | ICD-10-CM

## 2015-02-18 DIAGNOSIS — C50212 Malignant neoplasm of upper-inner quadrant of left female breast: Secondary | ICD-10-CM

## 2015-02-18 DIAGNOSIS — D6481 Anemia due to antineoplastic chemotherapy: Secondary | ICD-10-CM

## 2015-02-18 LAB — COMPREHENSIVE METABOLIC PANEL (CC13)
ALBUMIN: 3 g/dL — AB (ref 3.5–5.0)
ALK PHOS: 119 U/L (ref 40–150)
ALT: 10 U/L (ref 0–55)
AST: 22 U/L (ref 5–34)
Anion Gap: 9 mEq/L (ref 3–11)
BUN: 10.1 mg/dL (ref 7.0–26.0)
CO2: 24 mEq/L (ref 22–29)
CREATININE: 0.7 mg/dL (ref 0.6–1.1)
Calcium: 9.5 mg/dL (ref 8.4–10.4)
Chloride: 105 mEq/L (ref 98–109)
EGFR: >90 mL/min/{1.73_m2} (ref >90)
Glucose: 114 mg/dl (ref 70–140)
POTASSIUM: 3.7 meq/L (ref 3.5–5.1)
Sodium: 138 mEq/L (ref 136–145)
TOTAL PROTEIN: 8.1 g/dL (ref 6.4–8.3)
Total Bilirubin: 0.4 mg/dL (ref 0.20–1.20)

## 2015-02-18 LAB — CBC WITH DIFFERENTIAL/PLATELET
BASO%: 0.5 % (ref 0.0–2.0)
BASOS ABS: 0 10*3/uL (ref 0.0–0.1)
EOS%: 0.5 % (ref 0.0–7.0)
Eosinophils Absolute: 0 10*3/uL (ref 0.0–0.5)
HCT: 30.4 % — ABNORMAL LOW (ref 34.8–46.6)
HGB: 9.8 g/dL — ABNORMAL LOW (ref 11.6–15.9)
LYMPH#: 0.4 10*3/uL — AB (ref 0.9–3.3)
LYMPH%: 20 % (ref 14.0–49.7)
MCH: 26.9 pg (ref 25.1–34.0)
MCHC: 32.2 g/dL (ref 31.5–36.0)
MCV: 83.5 fL (ref 79.5–101.0)
MONO#: 0 10*3/uL — AB (ref 0.1–0.9)
MONO%: 0.5 % (ref 0.0–14.0)
NEUT#: 1.7 10*3/uL (ref 1.5–6.5)
NEUT%: 78.5 % — ABNORMAL HIGH (ref 38.4–76.8)
Platelets: 246 10*3/uL (ref 145–400)
RBC: 3.64 10*6/uL — ABNORMAL LOW (ref 3.70–5.45)
RDW: 23.1 % — AB (ref 11.2–14.5)
WBC: 2.2 10*3/uL — ABNORMAL LOW (ref 3.9–10.3)
nRBC: 0 % (ref 0–0)

## 2015-02-18 LAB — PROTIME-INR
INR: 5.67 (ref 0.00–1.49)
Prothrombin Time: 51.6 seconds — ABNORMAL HIGH (ref 11.6–15.2)

## 2015-02-18 NOTE — Progress Notes (Signed)
INR = 5.67 (sent out for confirmation) Pt has been taking 5 mg daily.   She had CMF chemo on 02/11/15 and has experienced "mouth burning" since then.  No white patchy areas seen by pt.  She states she did have a mouthwash in the past for this same issue but she ran out and presently has not used anything to help the problem.  This has caused her to eat less, perhaps contributing to the high INR today. Pt denies EtOH consumption and she has not taken any herbals/supplements/new OTC's. No bleeding/bruising. She confirmed she is using Coumadin 5 mg (peach) tabs we gave her as samples. INR elevated.  No Coumadin x 2 days.  She will take 2.5 mg on Thurs (3/10) then 5 mg daily until we see her on Mon 3/14 for repeat protime.  We should consider dose of ~25 mg/week (25% dose reduction) if INR within range on Monday. Pt repeated instructions w/ understanding. NO CHARGE - phone encounter. Kennith Center, Pharm.D., CPP 02/18/2015@2 :13 PM

## 2015-02-18 NOTE — Progress Notes (Signed)
Called and spoke with patient. She said her insurance has denied all narcotics. She is trying to get thru [facility]. She will call her insurance and call me back. I advised her Fentanyl is denied also.

## 2015-02-18 NOTE — Progress Notes (Unsigned)
Terri(RN)

## 2015-02-19 ENCOUNTER — Telehealth: Payer: Self-pay | Admitting: Emergency Medicine

## 2015-02-19 NOTE — Telephone Encounter (Signed)
Spoke with patient; patient states she has filled her pain medications and has resolved that issue.  Patient states that her mouth is feeling better; denies any sores in her mouth. States she ate some ravioli today without any discomfort. Advised patient to call back if problem worsens. Patient verbalized understanding.

## 2015-02-21 ENCOUNTER — Inpatient Hospital Stay (HOSPITAL_COMMUNITY)
Admission: EM | Admit: 2015-02-21 | Discharge: 2015-02-24 | DRG: 871 | Disposition: A | Discharge status: Home / Self Care | Payer: No Typology Code available for payment source | Attending: Internal Medicine | Admitting: Internal Medicine

## 2015-02-21 ENCOUNTER — Encounter (HOSPITAL_COMMUNITY): Payer: Self-pay | Admitting: *Deleted

## 2015-02-21 DIAGNOSIS — E876 Hypokalemia: Secondary | ICD-10-CM | POA: Diagnosis present

## 2015-02-21 DIAGNOSIS — N390 Urinary tract infection, site not specified: Secondary | ICD-10-CM | POA: Diagnosis present

## 2015-02-21 DIAGNOSIS — Z9012 Acquired absence of left breast and nipple: Secondary | ICD-10-CM | POA: Diagnosis present

## 2015-02-21 DIAGNOSIS — C50919 Malignant neoplasm of unspecified site of unspecified female breast: Secondary | ICD-10-CM | POA: Diagnosis present

## 2015-02-21 DIAGNOSIS — C7800 Secondary malignant neoplasm of unspecified lung: Secondary | ICD-10-CM | POA: Diagnosis present

## 2015-02-21 DIAGNOSIS — R319 Hematuria, unspecified: Secondary | ICD-10-CM | POA: Diagnosis present

## 2015-02-21 DIAGNOSIS — E440 Moderate protein-calorie malnutrition: Secondary | ICD-10-CM | POA: Insufficient documentation

## 2015-02-21 DIAGNOSIS — I959 Hypotension, unspecified: Secondary | ICD-10-CM | POA: Diagnosis present

## 2015-02-21 DIAGNOSIS — R310 Gross hematuria: Secondary | ICD-10-CM | POA: Diagnosis present

## 2015-02-21 DIAGNOSIS — Z6826 Body mass index (BMI) 26.0-26.9, adult: Secondary | ICD-10-CM

## 2015-02-21 DIAGNOSIS — E860 Dehydration: Secondary | ICD-10-CM | POA: Diagnosis present

## 2015-02-21 DIAGNOSIS — F419 Anxiety disorder, unspecified: Secondary | ICD-10-CM | POA: Diagnosis present

## 2015-02-21 DIAGNOSIS — T451X5A Adverse effect of antineoplastic and immunosuppressive drugs, initial encounter: Secondary | ICD-10-CM | POA: Diagnosis present

## 2015-02-21 DIAGNOSIS — C50912 Malignant neoplasm of unspecified site of left female breast: Secondary | ICD-10-CM | POA: Diagnosis present

## 2015-02-21 DIAGNOSIS — C787 Secondary malignant neoplasm of liver and intrahepatic bile duct: Secondary | ICD-10-CM | POA: Diagnosis present

## 2015-02-21 DIAGNOSIS — R791 Abnormal coagulation profile: Secondary | ICD-10-CM | POA: Diagnosis present

## 2015-02-21 DIAGNOSIS — D61810 Antineoplastic chemotherapy induced pancytopenia: Secondary | ICD-10-CM | POA: Diagnosis present

## 2015-02-21 DIAGNOSIS — Z7901 Long term (current) use of anticoagulants: Secondary | ICD-10-CM

## 2015-02-21 DIAGNOSIS — I82409 Acute embolism and thrombosis of unspecified deep veins of unspecified lower extremity: Secondary | ICD-10-CM | POA: Diagnosis present

## 2015-02-21 DIAGNOSIS — D61818 Other pancytopenia: Secondary | ICD-10-CM

## 2015-02-21 DIAGNOSIS — A419 Sepsis, unspecified organism: Secondary | ICD-10-CM | POA: Diagnosis not present

## 2015-02-21 LAB — URINALYSIS, ROUTINE W REFLEX MICROSCOPIC
GLUCOSE, UA: NEGATIVE mg/dL
KETONES UR: 15 mg/dL — AB
Nitrite: POSITIVE — AB
PH: 5 (ref 5.0–8.0)
Protein, ur: >300 mg/dL — AB
Specific Gravity, Urine: 1.027 (ref 1.005–1.030)
Urobilinogen, UA: 1 mg/dL (ref 0.0–1.0)

## 2015-02-21 LAB — COMPREHENSIVE METABOLIC PANEL
ALBUMIN: 3.2 g/dL — AB (ref 3.5–5.2)
ALT: 20 U/L (ref 0–35)
AST: 31 U/L (ref 0–37)
Alkaline Phosphatase: 123 U/L — ABNORMAL HIGH (ref 39–117)
Anion gap: 8 (ref 5–15)
BUN: 8 mg/dL (ref 6–23)
CALCIUM: 9.8 mg/dL (ref 8.4–10.5)
CO2: 24 mmol/L (ref 19–32)
Chloride: 106 mmol/L (ref 96–112)
Creatinine, Ser: 0.76 mg/dL (ref 0.50–1.10)
GFR calc non Af Amer: >90 mL/min (ref >90)
GLUCOSE: 106 mg/dL — AB (ref 70–99)
Potassium: 3.7 mmol/L (ref 3.5–5.1)
Sodium: 138 mmol/L (ref 135–145)
TOTAL PROTEIN: 8.1 g/dL (ref 6.0–8.3)
Total Bilirubin: 0.6 mg/dL (ref 0.3–1.2)

## 2015-02-21 LAB — URINE MICROSCOPIC-ADD ON

## 2015-02-21 LAB — CBC WITH DIFFERENTIAL/PLATELET
BASOS ABS: 0 10*3/uL (ref 0.0–0.1)
Basophils Relative: 0 % (ref 0–1)
Eosinophils Absolute: 0 10*3/uL (ref 0.0–0.7)
Eosinophils Relative: 1 % (ref 0–5)
HCT: 27 % — ABNORMAL LOW (ref 36.0–46.0)
Hemoglobin: 8.9 g/dL — ABNORMAL LOW (ref 12.0–15.0)
LYMPHS ABS: 0.6 10*3/uL — AB (ref 0.7–4.0)
Lymphocytes Relative: 39 % (ref 12–46)
MCH: 27.1 pg (ref 26.0–34.0)
MCHC: 33 g/dL (ref 30.0–36.0)
MCV: 82.1 fL (ref 78.0–100.0)
Monocytes Absolute: 0.1 10*3/uL (ref 0.1–1.0)
Monocytes Relative: 5 % (ref 3–12)
NEUTROS PCT: 55 % (ref 43–77)
Neutro Abs: 0.9 10*3/uL — ABNORMAL LOW (ref 1.7–7.7)
Platelets: 142 10*3/uL — ABNORMAL LOW (ref 150–400)
RBC: 3.29 MIL/uL — ABNORMAL LOW (ref 3.87–5.11)
RDW: 22.6 % — AB (ref 11.5–15.5)
WBC: 1.6 10*3/uL — ABNORMAL LOW (ref 4.0–10.5)

## 2015-02-21 LAB — PROTIME-INR
INR: 3.68 — ABNORMAL HIGH (ref 0.00–1.49)
Prothrombin Time: 36.8 seconds — ABNORMAL HIGH (ref 11.6–15.2)

## 2015-02-21 MED ORDER — SODIUM CHLORIDE 0.9 % IV BOLUS (SEPSIS)
1000.0000 mL | Freq: Once | INTRAVENOUS | Status: AC
Start: 2015-02-21 — End: 2015-02-22
  Administered 2015-02-21: 1000 mL via INTRAVENOUS

## 2015-02-21 MED ORDER — CEFTRIAXONE SODIUM 1 G IJ SOLR
1.0000 g | Freq: Once | INTRAMUSCULAR | Status: AC
  Administered 2015-02-21: 1 g via INTRAVENOUS
  Filled 2015-02-21: qty 10

## 2015-02-21 NOTE — ED Notes (Signed)
The pt saw some blood in her urine tonight.  She has breast cancer and is on chemo every 21 days  She has a rt chest porta -cath.  Lt breast mastectomy with liver mets.  She is on coumadin and the doctors are attempting to regulate ir

## 2015-02-21 NOTE — ED Provider Notes (Signed)
CSN: [id number]     Arrival date & time 02/21/15  2027 History  This chart was scribed for Julianne Rice, MD by Delphia Grates, ED Scribe. This patient was seen in room D30C/D30C and the patient's care was started at 11:13 PM.     Chief Complaint  Patient presents with  . Hematuria    The history is provided by the patient. No language interpreter was used.     HPI Comments: Erika [name] is a 44 y.o. female who presents to the Emergency Department complaining of sudden onset hematuria onset today. She notes her urine consists of primarily dark grossly bloody urine and reports 3-4 episodes since onset. Patient reports history of breast cancer (dx 2014) that has metastasized to liver and lungs and is currently undergoing chemotherapy every 21 days, last treatment 10 days ago. She denies fever, chills, abdominal pain, nausea, vomiting, or dysuria. Patient has history of DVT and is currently on Coumadin. She reports difficulty regulating INR. Scheduled to be seen in Coumadin clinic on Monday.  Oncologist: Lurline Del at [facility]   Past Medical History  Diagnosis Date  . Breast cancer     Invasive High Grade Carcinoma  . Headache(784.0)     Hx: of Migraines  . Status post chemotherapy     Adriamycin and Cytoxan  . Anxiety     ANXIOUS ABOUT HAVEING BIG SURGERY - MASTECTOMY  . 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 DVT (deep venous thrombosis)     RT ARM-PT ON LOVENOX  . Hx of iron deficiency anemia 11/26/13  . S/P radiation therapy 02-04-14 to 03-18-14                               1) Left Chest Wall / 50 Gy in 25 fractions/ 2) Left Supraclavicular fossa/ 46 Gy in 23 fractions / 3) Left Posterior Axillary boost / 8.533 Gy in 23 fractions/ 4) Left Chest Wall Scar boost / 10 Gy in 5 fractions   Past Surgical History  Procedure Laterality Date  . Tubal ligation    . Portacath placement Right 05/22/2013    Procedure: INSERTION PORT-A-CATH;  Surgeon: [name], MD;  Location: [facility];  Service: General;  Laterality: Right;  . Left breast needle core biopsy Left 05/21/13  . Left breast needle core  biopsy Left 06/22/13  . Mastectomy modified radical Left 11/15/2013    Procedure: LEFT MASTECTOMY MODIFIED RADICAL;  Surgeon: [name], MD;  Location: [facility];  Service: General;  Laterality: Left;   Family History  Problem Relation Age of Onset  . Hypertension Mother   . Diabetes Father 32  . Stomach cancer Maternal Aunt   . Hypertension Maternal Grandmother   . Hypertension Maternal Grandfather   . Diabetes Paternal Grandmother   . Diabetes Paternal Grandfather   . Diabetes Paternal Uncle   . Diabetes Paternal Uncle   . Diabetes Paternal Uncle    History  Substance Use Topics  . Smoking status: Never Smoker   . Smokeless tobacco: Never Used  . Alcohol Use: No   OB History    No data available     Review of Systems  Constitutional: Negative for fever and chills.  Respiratory: Negative for shortness of breath.   Cardiovascular: Negative for chest pain.  Gastrointestinal: Negative for nausea, vomiting, abdominal pain, diarrhea and constipation.  Genitourinary: Positive for hematuria. Negative for dysuria, frequency and flank pain.  Musculoskeletal: Negative for back pain, neck pain and neck stiffness.  Skin: Negative for rash and wound.  Neurological: Negative for dizziness, weakness, light-headedness, numbness and headaches.  All other systems reviewed and are negative.     Allergies  Doxycycline  Home Medications   Prior to Admission medications   Medication Sig Start Date End Date Taking? Authorizing Provider  cyclobenzaprine (FLEXERIL) 5 MG tablet TAKE 1 TABLET BY MOUTH THREE TIMES A DAY FOR MUSCLE SPASMS 01/28/15  Yes [name], NP  fentaNYL (DURAGESIC - DOSED MCG/HR) 25 MCG/HR patch Place 1 patch (25 mcg total) onto the skin every 3 (three) days. 02/11/15  Yes [name], MD  fentaNYL (DURAGESIC - DOSED MCG/HR) 75 MCG/HR Place  1 patch (75 mcg total) onto the skin every 3 (three) days. 02/11/15  Yes [name], MD  gabapentin (NEURONTIN) 300 MG capsule Take 1 capsule (300 mg total) by mouth 3 (three) times daily. 07/17/14  Yes [name], NP  loratadine (CLARITIN) 10 MG tablet Take 10 mg by mouth daily as needed for allergies.   Yes Historical Provider, MD  LORazepam (ATIVAN) 0.5 MG tablet Take 1 tablet (0.5 mg total) by mouth every 6 (six) hours as needed for anxiety. 01/21/15  Yes [name], NP  ondansetron (ZOFRAN) 8 MG tablet Take by mouth every 8 (eight) hours as needed for nausea or vomiting.   Yes Historical Provider, MD  oxyCODONE (OXY IR/ROXICODONE) 5 MG immediate release tablet Take 1 tablet (5 mg total) by mouth every 8 (eight) hours as needed for severe pain or breakthrough pain. 02/11/15  Yes [name], MD  potassium chloride SA (K-DUR,KLOR-CON) 20 MEQ tablet Take 1 tablet (20 mEq total) by mouth 4 (four) times daily. 01/15/15  Yes [name], NP  traMADol (ULTRAM) 50 MG tablet Take 1-2 tablets (50-100 mg total) by mouth every 6 (six) hours as needed. 07/17/14  Yes [name], NP  warfarin (COUMADIN) 5 MG tablet Take 2.5-5 mg by mouth daily. Patient is getting regulated on Coumadin.  Take 2.5 mg on 02/21/15 then take 5 mg on Saturday and Sunday then will go back to doctor.   Yes Historical Provider, MD  zolpidem (AMBIEN) 5 MG tablet Take 1 tablet (5 mg total) by mouth at bedtime as needed for sleep. 06/27/14  Yes Minette Headland, NP  HYDROcodone-homatropine (HYCODAN) 5-1.5 MG/5ML syrup Take 5 mLs by mouth every 6 (six) hours as needed for cough. Patient not taking: Reported on 02/21/2015 01/15/15   Susanne Borders, NP  UNABLE TO FIND Rx: [phone]- Post Surgical Bra (Quantity: 6) C1660- Non Silicone Breast prosthesis (Quantity: 1) Dx: 174.9; Left mastectomy 01/17/14   [name], MD  warfarin (COUMADIN) 7.5 MG tablet Take 1 tablet (7.5 mg total) by mouth daily. Patient not taking:  Reported on 02/21/2015 01/29/15   [name], MD   Triage Vitals: BP 110/83 mmHg  Pulse 106  Temp(Src) 98.4 F (36.9 C)  Resp 18  Ht 5' 5.5" (1.664 m)  Wt 164 lb (74.39 kg)  BMI 26.87 kg/m2  SpO2 98%  LMP 05/30/2013  Physical Exam  Constitutional: She is oriented to person, place, and time. She appears well-developed and well-nourished. No distress.  HENT:  Head: Normocephalic and atraumatic.  Mouth/Throat: Oropharynx is clear and moist. No oropharyngeal exudate.  Eyes: Conjunctivae and EOM are normal. Pupils are equal, round, and reactive to light.  Neck: Normal range of motion. Neck supple. No tracheal deviation  present.  Cardiovascular: Normal rate and regular rhythm.  Exam reveals no gallop and no friction rub.   No murmur heard. Pulmonary/Chest: Effort normal and breath sounds normal. No respiratory distress. She has no wheezes. She has no rales. She exhibits no tenderness.  Left mastectomy  Abdominal: Soft. Bowel sounds are normal. She exhibits no distension and no mass. There is no tenderness. There is no rebound and no guarding.  Musculoskeletal: Normal range of motion. She exhibits no edema or tenderness.  No CVA tenderness bilaterally.  Neurological: She is alert and oriented to person, place, and time.  Skin: Skin is warm and dry. No rash noted. No erythema.  Psychiatric: She has a normal mood and affect. Her behavior is normal.  Nursing note and vitals reviewed.   ED Course  Procedures (including critical care time)  DIAGNOSTIC STUDIES: Oxygen Saturation is 98% on room air, normal by my interpretation.    COORDINATION OF CARE: At 2318 Discussed treatment plan with patient. Patient agrees.   Labs Review Labs Reviewed  CBC WITH DIFFERENTIAL/PLATELET - Abnormal; Notable for the following:    WBC 1.6 (*)    RBC 3.29 (*)    Hemoglobin 8.9 (*)    HCT 27.0 (*)    RDW 22.6 (*)    Platelets 142 (*)    Neutro Abs 0.9 (*)    Lymphs Abs 0.6 (*)    All other  components within normal limits  COMPREHENSIVE METABOLIC PANEL - Abnormal; Notable for the following:    Glucose, Bld 106 (*)    Albumin 3.2 (*)    Alkaline Phosphatase 123 (*)    All other components within normal limits  PROTIME-INR - Abnormal; Notable for the following:    Prothrombin Time 36.8 (*)    INR 3.68 (*)    All other components within normal limits  URINALYSIS, ROUTINE W REFLEX MICROSCOPIC - Abnormal; Notable for the following:    Color, Urine RED (*)    APPearance TURBID (*)    Hgb urine dipstick LARGE (*)    Bilirubin Urine LARGE (*)    Ketones, ur 15 (*)    Protein, ur >300 (*)    Nitrite POSITIVE (*)    Leukocytes, UA MODERATE (*)    All other components within normal limits  URINE MICROSCOPIC-ADD ON - Abnormal; Notable for the following:    Bacteria, UA MANY (*)    All other components within normal limits  BASIC METABOLIC PANEL - Abnormal; Notable for the following:    Potassium 3.1 (*)    Chloride 113 (*)    Glucose, Bld 148 (*)    Calcium 7.9 (*)    Anion gap 2 (*)    All other components within normal limits  CBC - Abnormal; Notable for the following:    WBC 1.2 (*)    RBC 2.53 (*)    Hemoglobin 6.9 (*)    HCT 21.1 (*)    RDW 22.9 (*)    Platelets 108 (*)    All other components within normal limits  CBC - Abnormal; Notable for the following:    WBC 1.3 (*)    RBC 2.74 (*)    Hemoglobin 7.3 (*)    HCT 23.3 (*)    RDW 23.1 (*)    Platelets 114 (*)    All other components within normal limits  HEMOGLOBIN AND HEMATOCRIT, BLOOD - Abnormal; Notable for the following:    Hemoglobin 8.3 (*)    HCT 25.3 (*)  All other components within normal limits  CBC - Abnormal; Notable for the following:    WBC 1.3 (*)    RBC 3.20 (*)    Hemoglobin 8.5 (*)    HCT 26.6 (*)    RDW 22.1 (*)    Platelets 119 (*)    All other components within normal limits  BASIC METABOLIC PANEL - Abnormal; Notable for the following:    Glucose, Bld 109 (*)    GFR calc non  Af Amer 87 (*)    All other components within normal limits  PROTIME-INR - Abnormal; Notable for the following:    Prothrombin Time 38.4 (*)    INR 3.88 (*)    All other components within normal limits  MRSA PCR SCREENING  TSH  CORTISOL  HIV ANTIBODY (ROUTINE TESTING)  TYPE AND SCREEN  ABO/RH  PREPARE RBC (CROSSMATCH)    Imaging Review No results found.   EKG Interpretation None      MDM   Final diagnoses:  Hematuria  UTI (lower urinary tract infection)  Pancytopenia  Hypotension, unspecified hypotension type   I personally performed the services described in this documentation, which was scribed in my presence. The recorded information has been reviewed and is accurate.  Very well-appearing patient on Coumadin for DVT. She presents with gross hematuria but no other symptoms. UA consistent with urinary tract infection.  Patient continues to be well-appearing though her blood pressure is dropping in the emergency department. She's been given IV fluids with little response. Discussed with Dr. Arnoldo Morale and will admit patient.  Julianne Rice, MD 02/23/15 [phone]

## 2015-02-21 NOTE — ED Notes (Signed)
The pt usually gets  Her blood drawn from her arm not her porta-cath

## 2015-02-22 DIAGNOSIS — C50212 Malignant neoplasm of upper-inner quadrant of left female breast: Secondary | ICD-10-CM | POA: Diagnosis not present

## 2015-02-22 DIAGNOSIS — D61810 Antineoplastic chemotherapy induced pancytopenia: Secondary | ICD-10-CM | POA: Diagnosis present

## 2015-02-22 DIAGNOSIS — R319 Hematuria, unspecified: Secondary | ICD-10-CM | POA: Diagnosis present

## 2015-02-22 DIAGNOSIS — Z7901 Long term (current) use of anticoagulants: Secondary | ICD-10-CM | POA: Diagnosis not present

## 2015-02-22 DIAGNOSIS — N390 Urinary tract infection, site not specified: Secondary | ICD-10-CM | POA: Diagnosis present

## 2015-02-22 DIAGNOSIS — E876 Hypokalemia: Secondary | ICD-10-CM | POA: Diagnosis present

## 2015-02-22 DIAGNOSIS — E440 Moderate protein-calorie malnutrition: Secondary | ICD-10-CM | POA: Diagnosis present

## 2015-02-22 DIAGNOSIS — Z86718 Personal history of other venous thrombosis and embolism: Secondary | ICD-10-CM | POA: Diagnosis not present

## 2015-02-22 DIAGNOSIS — D630 Anemia in neoplastic disease: Secondary | ICD-10-CM

## 2015-02-22 DIAGNOSIS — E860 Dehydration: Secondary | ICD-10-CM | POA: Diagnosis present

## 2015-02-22 DIAGNOSIS — I9589 Other hypotension: Secondary | ICD-10-CM

## 2015-02-22 DIAGNOSIS — C50912 Malignant neoplasm of unspecified site of left female breast: Secondary | ICD-10-CM

## 2015-02-22 DIAGNOSIS — I951 Orthostatic hypotension: Secondary | ICD-10-CM | POA: Diagnosis not present

## 2015-02-22 DIAGNOSIS — C782 Secondary malignant neoplasm of pleura: Secondary | ICD-10-CM | POA: Diagnosis not present

## 2015-02-22 DIAGNOSIS — R791 Abnormal coagulation profile: Secondary | ICD-10-CM | POA: Diagnosis not present

## 2015-02-22 DIAGNOSIS — A419 Sepsis, unspecified organism: Secondary | ICD-10-CM | POA: Diagnosis present

## 2015-02-22 DIAGNOSIS — T451X5A Adverse effect of antineoplastic and immunosuppressive drugs, initial encounter: Secondary | ICD-10-CM

## 2015-02-22 DIAGNOSIS — I959 Hypotension, unspecified: Secondary | ICD-10-CM | POA: Diagnosis present

## 2015-02-22 DIAGNOSIS — Z6826 Body mass index (BMI) 26.0-26.9, adult: Secondary | ICD-10-CM | POA: Diagnosis not present

## 2015-02-22 DIAGNOSIS — C50919 Malignant neoplasm of unspecified site of unspecified female breast: Secondary | ICD-10-CM | POA: Diagnosis present

## 2015-02-22 DIAGNOSIS — F419 Anxiety disorder, unspecified: Secondary | ICD-10-CM | POA: Diagnosis present

## 2015-02-22 DIAGNOSIS — I82402 Acute embolism and thrombosis of unspecified deep veins of left lower extremity: Secondary | ICD-10-CM

## 2015-02-22 DIAGNOSIS — Z9012 Acquired absence of left breast and nipple: Secondary | ICD-10-CM | POA: Diagnosis present

## 2015-02-22 DIAGNOSIS — R310 Gross hematuria: Secondary | ICD-10-CM | POA: Diagnosis present

## 2015-02-22 DIAGNOSIS — C787 Secondary malignant neoplasm of liver and intrahepatic bile duct: Secondary | ICD-10-CM | POA: Diagnosis present

## 2015-02-22 DIAGNOSIS — C7800 Secondary malignant neoplasm of unspecified lung: Secondary | ICD-10-CM | POA: Diagnosis present

## 2015-02-22 DIAGNOSIS — I82409 Acute embolism and thrombosis of unspecified deep veins of unspecified lower extremity: Secondary | ICD-10-CM | POA: Diagnosis not present

## 2015-02-22 LAB — BASIC METABOLIC PANEL
ANION GAP: 2 — AB (ref 5–15)
BUN: 7 mg/dL (ref 6–23)
CHLORIDE: 113 mmol/L — AB (ref 96–112)
CO2: 22 mmol/L (ref 19–32)
Calcium: 7.9 mg/dL — ABNORMAL LOW (ref 8.4–10.5)
Creatinine, Ser: 0.63 mg/dL (ref 0.50–1.10)
GFR calc non Af Amer: >90 mL/min (ref >90)
GLUCOSE: 148 mg/dL — AB (ref 70–99)
POTASSIUM: 3.1 mmol/L — AB (ref 3.5–5.1)
Sodium: 137 mmol/L (ref 135–145)

## 2015-02-22 LAB — CBC
HCT: 23.3 % — ABNORMAL LOW (ref 36.0–46.0)
HEMATOCRIT: 21.1 % — AB (ref 36.0–46.0)
Hemoglobin: 6.9 g/dL — CL (ref 12.0–15.0)
Hemoglobin: 7.3 g/dL — ABNORMAL LOW (ref 12.0–15.0)
MCH: 27.3 pg (ref 26.0–34.0)
MCH: 26.6 pg (ref 26.0–34.0)
MCHC: 32.7 g/dL (ref 30.0–36.0)
MCHC: 31.3 g/dL (ref 30.0–36.0)
MCV: 83.4 fL (ref 78.0–100.0)
MCV: 85 fL (ref 78.0–100.0)
PLATELETS: 114 10*3/uL — AB (ref 150–400)
Platelets: 108 10*3/uL — ABNORMAL LOW (ref 150–400)
RBC: 2.53 MIL/uL — ABNORMAL LOW (ref 3.87–5.11)
RBC: 2.74 MIL/uL — ABNORMAL LOW (ref 3.87–5.11)
RDW: 22.9 % — ABNORMAL HIGH (ref 11.5–15.5)
RDW: 23.1 % — ABNORMAL HIGH (ref 11.5–15.5)
WBC: 1.2 10*3/uL — CL (ref 4.0–10.5)
WBC: 1.3 10*3/uL — CL (ref 4.0–10.5)

## 2015-02-22 LAB — MRSA PCR SCREENING: MRSA BY PCR: NEGATIVE

## 2015-02-22 LAB — PREPARE RBC (CROSSMATCH)

## 2015-02-22 LAB — TSH: TSH: 1.133 u[IU]/mL (ref 0.350–4.500)

## 2015-02-22 LAB — ABO/RH: ABO/RH(D): A POS

## 2015-02-22 LAB — HEMOGLOBIN AND HEMATOCRIT, BLOOD
HCT: 25.3 % — ABNORMAL LOW (ref 36.0–46.0)
HEMOGLOBIN: 8.3 g/dL — AB (ref 12.0–15.0)

## 2015-02-22 MED ORDER — ALUM & MAG HYDROXIDE-SIMETH 200-200-20 MG/5ML PO SUSP: 30.0000 mL | Freq: Four times a day (QID) | ORAL | Status: DC | PRN

## 2015-02-22 MED ORDER — ONDANSETRON HCL 4 MG/2ML IJ SOLN: 4.0000 mg | Freq: Four times a day (QID) | INTRAMUSCULAR | Status: DC | PRN

## 2015-02-22 MED ORDER — VANCOMYCIN HCL IN DEXTROSE 750-5 MG/150ML-% IV SOLN
750.0000 mg | Freq: Three times a day (TID) | INTRAVENOUS | Status: DC
  Administered 2015-02-22 – 2015-02-23 (×3): 750 mg via INTRAVENOUS
  Filled 2015-02-22 (×6): qty 150

## 2015-02-22 MED ORDER — OXYCODONE HCL 5 MG PO TABS
5.0000 mg | ORAL_TABLET | ORAL | Status: DC | PRN
  Administered 2015-02-23 – 2015-02-24 (×6): 5 mg via ORAL
  Filled 2015-02-22 (×6): qty 1

## 2015-02-22 MED ORDER — SODIUM CHLORIDE 0.9 % IV SOLN
INTRAVENOUS | Status: AC
  Administered 2015-02-22: 05:00:00 via INTRAVENOUS

## 2015-02-22 MED ORDER — DEXTROSE 5 % IV SOLN
1.0000 g | Freq: Three times a day (TID) | INTRAVENOUS | Status: DC
  Administered 2015-02-22 – 2015-02-23 (×4): 1 g via INTRAVENOUS
  Filled 2015-02-22 (×7): qty 1

## 2015-02-22 MED ORDER — CYCLOBENZAPRINE HCL 10 MG PO TABS
5.0000 mg | ORAL_TABLET | Freq: Three times a day (TID) | ORAL | Status: DC | PRN
  Administered 2015-02-23 – 2015-02-24 (×5): 5 mg via ORAL
  Filled 2015-02-22 (×5): qty 1

## 2015-02-22 MED ORDER — VANCOMYCIN HCL IN DEXTROSE 750-5 MG/150ML-% IV SOLN
750.0000 mg | Freq: Once | INTRAVENOUS | Status: AC
  Administered 2015-02-22: 750 mg via INTRAVENOUS
  Filled 2015-02-22: qty 150

## 2015-02-22 MED ORDER — SODIUM CHLORIDE 0.9 % IV SOLN
Freq: Once | INTRAVENOUS | Status: AC
Start: 2015-02-22 — End: 2015-02-22

## 2015-02-22 MED ORDER — SODIUM CHLORIDE 0.9 % IV SOLN: Freq: Once | INTRAVENOUS | Status: AC

## 2015-02-22 MED ORDER — SODIUM CHLORIDE 0.9 % IV SOLN
INTRAVENOUS | Status: DC
  Administered 2015-02-22 (×2): via INTRAVENOUS

## 2015-02-22 MED ORDER — POTASSIUM CHLORIDE CRYS ER 20 MEQ PO TBCR
40.0000 meq | EXTENDED_RELEASE_TABLET | ORAL | Status: AC | PRN
  Administered 2015-02-22 – 2015-02-23 (×2): 40 meq via ORAL
  Filled 2015-02-22 (×2): qty 2

## 2015-02-22 MED ORDER — LORAZEPAM 0.5 MG PO TABS
0.5000 mg | ORAL_TABLET | Freq: Four times a day (QID) | ORAL | Status: DC | PRN
  Administered 2015-02-23 – 2015-02-24 (×5): 0.5 mg via ORAL
  Filled 2015-02-22 (×5): qty 1

## 2015-02-22 MED ORDER — SODIUM CHLORIDE 0.9 % IV BOLUS (SEPSIS)
1000.0000 mL | Freq: Once | INTRAVENOUS | Status: AC
  Administered 2015-02-22: 1000 mL via INTRAVENOUS

## 2015-02-22 MED ORDER — ENSURE COMPLETE PO LIQD
237.0000 mL | Freq: Two times a day (BID) | ORAL | Status: DC
  Administered 2015-02-22 – 2015-02-24 (×6): 237 mL via ORAL

## 2015-02-22 MED ORDER — ACETAMINOPHEN 325 MG PO TABS: 650.0000 mg | ORAL_TABLET | Freq: Four times a day (QID) | ORAL | Status: DC | PRN

## 2015-02-22 MED ORDER — ACETAMINOPHEN 650 MG RE SUPP: 650.0000 mg | Freq: Four times a day (QID) | RECTAL | Status: DC | PRN

## 2015-02-22 MED ORDER — ONDANSETRON HCL 4 MG PO TABS
4.0000 mg | ORAL_TABLET | Freq: Four times a day (QID) | ORAL | Status: DC | PRN
  Administered 2015-02-23: 4 mg via ORAL
  Filled 2015-02-22: qty 1

## 2015-02-22 MED ORDER — HYDROMORPHONE HCL 1 MG/ML IJ SOLN: 0.5000 mg | INTRAMUSCULAR | Status: DC | PRN

## 2015-02-22 NOTE — ED Notes (Signed)
No complaints.  Iv fluid has infused

## 2015-02-22 NOTE — Progress Notes (Signed)
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 DOCUMENTATION CODES Per approved criteria  -Non-severe (moderate) malnutrition in the context of chronic illness   INTERVENTION: -Ensure Enlive po BID, each supplement provides 350 kcal and 20 grams of protein  -Ordered High Pro/Cal snacks for pt  NUTRITION DIAGNOSIS: Increased protein energy needs related to needs for chronic disease state as evidenced by treatment for stage IV Breast Cancer.   Goal: Pt to meet >/= 90% of their estimated nutrition needs    Monitor:  Oral intake, snack reception, labs, management of symptoms  Reason for Assessment: MST of 5  44 y.o. 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 She says occasionally she will get a raw mouth as a complication from her cancer treatments, but will try to eat anyway. She is well aware that she needs to maintain her weight and statess he does well eating and trys to eat high protein, high calorie snacks. She "trys to make the most of her calories"  At home she will do a protein shake and was doing a "nutriburst vitamin" prior to starting coumadin which she wil restart in the near future.   She does not have a normal weight anymore. She says she notices her clothes arent fitting and specifically mentioned fat loss around her abdomen. She denies muscle loss and says is very active with plans to run 2 miles 3-4 times a week.   Nutrition Focused Physical Exam:  Subcutaneous Fat:  Orbital Region: well nourished Upper Arm Region: mild Thoracic and Lumbar Region: mild-mod (per pt)  Muscle:   Temple Region: not assessed (wears hat) Clavicle Bone Region: not assessed Clavicle and Acromion Bone Region: not assessed Scapular Bone Region: not assessed Dorsal Hand: well nourished Patellar Region: well nourished Anterior Thigh Region: well nourished Posterior Calf Region: well nourished  Edema: none    Height: Ht Readings from Last 1 Encounters:  02/21/15 5' 5.5" (1.664 m)    Weight: Wt Readings from Last 1 Encounters:  02/21/15 164 lb (74.39 kg)    Ideal Body Weight: 132.5  % Ideal Body Weight: 124%  Wt Readings from Last 10 Encounters:  02/21/15 164 lb (74.39 kg)  02/04/15 170 lb 3.2 oz (77.202 kg)  01/28/15 167 lb 3.2 oz (75.841 kg)  01/21/15 172 lb 11.2 oz (78.336 kg)  01/15/15 168 lb 8 oz (76.431 kg)  01/07/15 166 lb (75.297 kg)  12/31/14 171 lb (77.565 kg)  12/17/14 173 lb 8 oz (78.699 kg)  12/10/14 171 lb 9.6 oz (77.837 kg)  11/26/14 176 lb 14.4 oz (80.241 kg)  Pt has lost 50 lbs in 1 year. Was 220 on 02/18/14 ~25% loss However has been fairly stable for past 3 months with loss of ~12 pounds (not sig)  Usual Body Weight: 160-170  % Usual Body Weight: 100%  BMI:  Body mass index is 26.87 kg/(m^2).  Estimated Nutritional Needs: Kcal: 1850-2100 Protein: 89-103 Fluid: 1.8-2.1 liters  Skin: wdl  Diet Order: Diet regular  EDUCATION NEEDS: -No education needs identified at this time   Intake/Output Summary (Last 24 hours) at  02/22/15 0800 Last data filed at 02/22/15 0600  Gross per 24 hour  Intake   3200 ml  Output    350 ml  Net   2850 ml    Last BM: WDL  Labs:   Recent Labs Lab 02/18/15 1055 02/21/15 2100  NA 138 138  K 3.7 3.7  CL  --  106  CO2 24 24  BUN 10.1 8  CREATININE 0.7 0.76  CALCIUM 9.5 9.8  GLUCOSE 114 106*    CBG (last 3)  No results for input(s): GLUCAP in the last 72 hours.  Scheduled Meds: . sodium chloride   Intravenous STAT  . sodium chloride   Intravenous Once  . ceFEPime (MAXIPIME) IV  1 g  Intravenous 3 times per day  . vancomycin  750 mg Intravenous Q8H    Continuous Infusions: . sodium chloride 125 mL/hr at 02/22/15 0751    Past Medical History  Diagnosis Date  . Breast cancer     Invasive High Grade Carcinoma  . Headache(784.0)     Hx: of Migraines  . Status post chemotherapy     Adriamycin and Cytoxan  . Anxiety     ANXIOUS ABOUT HAVEING BIG SURGERY - MASTECTOMY  . 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 DVT (deep venous thrombosis)     RT ARM-PT ON LOVENOX  . Hx of iron deficiency anemia 11/26/13  . S/P radiation therapy 02-04-14 to 03-18-14                               1) Left Chest Wall / 50 Gy in 25 fractions/ 2) Left Supraclavicular fossa/ 46 Gy in 23 fractions / 3) Left Posterior Axillary boost / 8.533 Gy in 23 fractions/ 4) Left Chest Wall Scar boost / 10 Gy in 5 fractions    Past Surgical History  Procedure Laterality Date  . Tubal ligation    . Portacath placement Right 05/22/2013    Procedure: INSERTION PORT-A-CATH;  Surgeon: [name], MD;  Location: [facility];  Service: General;  Laterality: Right;  . Left breast needle core biopsy Left 05/21/13  . Left breast needle core  biopsy Left 06/22/13  . Mastectomy modified radical Left 11/15/2013    Procedure: LEFT MASTECTOMY MODIFIED RADICAL;  Surgeon: [name], MD;  Location: [facility];  Service: General;  Laterality: Left;    Burtis Junes RD, LDN Nutrition Pager: [phone] 02/22/2015 8:00 AM

## 2015-02-22 NOTE — Progress Notes (Signed)
44 y/o female admitted early this AM for hematuria I have reviewed the H and P and examined her.   Principal Problem:  UTI- no fever- has tachycardia, leukopenia - cont Vanc and Rocephin- blood cultures not drawn prior to anitbiotics  Active Problems:   Pancytopenia due to antineoplastic chemotherapy - had chemo last week - transfuse 1 U PRBC - differential on WBC count shows 50% Neutrophils therefore not neutropenic  - no need for plt transfusion - plt > 100  Hypokalemia - replace today    DVT (deep venous thrombosis) - renal vein thrombus found last month- started on Coumadin by Oncology- INR  Is supra therapeutic therefore it is on hold- cont to follow INR- have consulted ONC to see if it is safe to continue it in setting of thrombocytopenia    Hypotension/ tahcycardia - f/u Cortisol- Fentanyl patch removed by admitting physician- BP improved after 3 L NS    Hematuria - in relation to UTI and supratherapeutic INR    Stage IV carcinoma of breast - outpt f/u with Onc- have placed Dr Jana Hakim as consultant in [facility]    Debbe Odea, MD

## 2015-02-22 NOTE — ED Notes (Signed)
2nd liter nss added to iv

## 2015-02-22 NOTE — Progress Notes (Signed)
ANTIBIOTIC CONSULT NOTE - INITIAL  Pharmacy Consult for vancomycin and cefepime Indication: rule out sepsis  Allergies  Allergen Reactions  . Doxycycline Nausea And Vomiting    Patient Measurements: Height: 5' 5.5" (166.4 cm) Weight: 164 lb (74.39 kg) IBW/kg (Calculated) : 58.15  Vital Signs: Temp: 98.7 F (37.1 C) (03/12 0105) BP: 90/57 mmHg (03/12 0429) Pulse Rate: 104 (03/12 0429)  Labs:  Recent Labs  02/21/15 2100  WBC 1.6*  HGB 8.9*  PLT 142*  CREATININE 0.76   Estimated Creatinine Clearance: 91.7 mL/min (by C-G formula based on Cr of 0.76).  Medical History: Past Medical History  Diagnosis Date  . Breast cancer     Invasive High Grade Carcinoma  . Headache(784.0)     Hx: of Migraines  . Status post chemotherapy     Adriamycin and Cytoxan  . Anxiety     ANXIOUS ABOUT HAVEING BIG SURGERY - MASTECTOMY  . 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 DVT (deep venous thrombosis)     RT ARM-PT ON LOVENOX  . Hx of iron deficiency anemia 11/26/13  . S/P radiation therapy 02-04-14 to 03-18-14                               1) Left Chest Wall / 50 Gy in 25 fractions/ 2) Left Supraclavicular fossa/ 46 Gy in 23 fractions / 3) Left Posterior Axillary boost / 8.533 Gy in 23 fractions/ 4) Left Chest Wall Scar boost / 10 Gy in 5 fractions     Assessment: 44yo female w/ breast ca on chemo c/o blood in urine, on Coumadin for renal vein thrombosis w/ INR slightly supratherapeutic (3.68) though was >5 four days ago, also w/ abnormal UA, to begin IV ABX for suspected sepsis 2/2 UTI.  Goal of Therapy:  Vancomycin trough level 15-20 mcg/ml  Plan:  Rec'd Rocephin in ED; will begin vancomycin 750mg  IV Q8H and cefepime 1g IV Q8H and monitor CBC, Cx, levels prn; f/u resuming anticoag.  Wynona Neat, PharmD, BCPS  02/22/2015,4:31 AM

## 2015-02-22 NOTE — H&P (Signed)
[facility] Admission History and Physical       Erika [name] JGO:[id number] DOB: 03/06/1971 DOA: 02/21/2015  Referring physician: EDP PCP: No PCP Per Patient  Specialists:   Chief Complaint: Blood in Urine  HPI: Erika [name] is a 44 y.o. 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  She denies any fevers or chills or nausea vomiting or dysuria.  She was found to have a+ UA, as well as a mildly Supra-therapeutic INR.  She was also hypotensive despite 3 liters of IVFs that were given she was referred for medical admission.     Review of Systems:  Constitutional: No Weight Loss, No Weight Gain, Night Sweats, Fevers, Chills, Dizziness, Light Headedness, Fatigue, or Generalized Weakness HEENT: No Headaches, Difficulty Swallowing,Tooth/Dental Problems,Sore Throat,  No Sneezing, Rhinitis, Ear Ache, Nasal Congestion, or Post Nasal Drip,  Cardio-vascular:  No Chest pain, Orthopnea, PND, Edema in Lower Extremities, Anasarca, Dizziness, Palpitations  Resp: No Dyspnea, No DOE, No Productive Cough, No Non-Productive Cough, No Hemoptysis, No Wheezing.    GI: No Heartburn, Indigestion, Abdominal Pain, Nausea, Vomiting, Diarrhea, Constipation, Hematemesis, Hematochezia, Melena, Change in Bowel Habits,  Loss of Appetite  GU: No Dysuria, + Hematuria, No Change in Color of Urine, No Urgency or Urinary Frequency, No Flank pain.  Musculoskeletal: No Joint Pain or Swelling, No Decreased Range of Motion, No Back Pain.  Neurologic: No Syncope, No Seizures, Muscle Weakness, Paresthesia, Vision Disturbance or Loss, No Diplopia, No Vertigo, No Difficulty Walking,  Skin: No Rash or Lesions. Psych: No Change in Mood or Affect, No Depression or Anxiety,  No Memory loss, No Confusion, or Hallucinations   Past Medical History  Diagnosis Date  . Breast cancer     Invasive High Grade Carcinoma  . Headache(784.0)     Hx: of Migraines  . Status post chemotherapy     Adriamycin and Cytoxan  . Anxiety     ANXIOUS ABOUT HAVEING BIG SURGERY - MASTECTOMY  . 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 DVT (deep venous thrombosis)     RT ARM-PT ON LOVENOX  . Hx of iron deficiency anemia 11/26/13  . S/P radiation therapy 02-04-14 to 03-18-14                               1) Left Chest Wall / 50 Gy in 25 fractions/ 2) Left Supraclavicular fossa/ 46 Gy in 23 fractions / 3) Left Posterior Axillary boost / 8.533 Gy in 23 fractions/ 4) Left Chest Wall Scar boost / 10 Gy in 5 fractions     Past Surgical History  Procedure Laterality Date  . Tubal ligation    . Portacath placement Right 05/22/2013    Procedure: INSERTION PORT-A-CATH;  Surgeon: [name], MD;  Location: [facility];  Service: General;  Laterality: Right;  . Left breast needle core biopsy Left 05/21/13  . Left breast needle core  biopsy Left 06/22/13  . Mastectomy modified radical Left 11/15/2013    Procedure: LEFT MASTECTOMY MODIFIED RADICAL;  Surgeon: [name], MD;  Location: [facility];  Service: General;  Laterality: Left;      Prior to Admission medications   Medication  Sig Start Date End Date Taking? Authorizing Provider  cyclobenzaprine (FLEXERIL) 5 MG tablet TAKE 1 TABLET BY MOUTH THREE TIMES A DAY FOR MUSCLE SPASMS 01/28/15  Yes [name], NP  fentaNYL (DURAGESIC - DOSED MCG/HR) 25 MCG/HR patch Place 1 patch (25 mcg total) onto the skin every 3 (three) days. 02/11/15  Yes [name], MD  fentaNYL (DURAGESIC - DOSED MCG/HR) 75 MCG/HR Place 1 patch (75 mcg total) onto the skin every 3 (three) days. 02/11/15  Yes [name], MD  gabapentin (NEURONTIN) 300 MG capsule Take 1 capsule (300 mg total) by mouth 3 (three) times daily. 07/17/14  Yes [name], NP    loratadine (CLARITIN) 10 MG tablet Take 10 mg by mouth daily as needed for allergies.   Yes Historical Provider, MD  LORazepam (ATIVAN) 0.5 MG tablet Take 1 tablet (0.5 mg total) by mouth every 6 (six) hours as needed for anxiety. 01/21/15  Yes [name], NP  ondansetron (ZOFRAN) 8 MG tablet Take by mouth every 8 (eight) hours as needed for nausea or vomiting.   Yes Historical Provider, MD  oxyCODONE (OXY IR/ROXICODONE) 5 MG immediate release tablet Take 1 tablet (5 mg total) by mouth every 8 (eight) hours as needed for severe pain or breakthrough pain. 02/11/15  Yes [name], MD  potassium chloride SA (K-DUR,KLOR-CON) 20 MEQ tablet Take 1 tablet (20 mEq total) by mouth 4 (four) times daily. 01/15/15  Yes [name], NP  traMADol (ULTRAM) 50 MG tablet Take 1-2 tablets (50-100 mg total) by mouth every 6 (six) hours as needed. 07/17/14  Yes [name], NP  warfarin (COUMADIN) 5 MG tablet Take 2.5-5 mg by mouth daily. Patient is getting regulated on Coumadin.  Take 2.5 mg on 02/21/15 then take 5 mg on Saturday and Sunday then will go back to doctor.   Yes Historical Provider, MD  zolpidem (AMBIEN) 5 MG tablet Take 1 tablet (5 mg total) by mouth at bedtime as needed for sleep. 06/27/14  Yes Minette Headland, NP  HYDROcodone-homatropine (HYCODAN) 5-1.5 MG/5ML syrup Take 5 mLs by mouth every 6 (six) hours as needed for cough. Patient not taking: Reported on 02/21/2015 01/15/15   Susanne Borders, NP  UNABLE TO FIND Rx: [phone]- Post Surgical Bra (Quantity: 6) D9833- Non Silicone Breast prosthesis (Quantity: 1) Dx: 174.9; Left mastectomy 01/17/14   [name], MD  warfarin (COUMADIN) 7.5 MG tablet Take 1 tablet (7.5 mg total) by mouth daily. Patient not taking: Reported on 02/21/2015 01/29/15   [name], MD     Allergies  Allergen Reactions  . Doxycycline Nausea And Vomiting    Social History:  reports that she has never smoked. She has never used smokeless tobacco. She  reports that she does not drink alcohol or use illicit drugs.    Family History  Problem Relation Age of Onset  . Hypertension Mother   . Diabetes Father 9  . Stomach cancer Maternal Aunt   . Hypertension Maternal Grandmother   . Hypertension Maternal Grandfather   . Diabetes Paternal Grandmother   . Diabetes Paternal Grandfather   . Diabetes Paternal Uncle   . Diabetes Paternal Uncle   . Diabetes Paternal Uncle        Physical Exam:  GEN:  Pleasant Well Nourished and Well Devloped 44 y.o. African American female examined and in no acute distress; cooperative with exam Filed Vitals:   02/22/15 0105 02/22/15 8250 02/22/15 0215 02/22/15 0345  BP: 91/52 80/58 83/54  75/47  Pulse: 112 100 109 102  Temp: 98.7 F (37.1 C)     Resp:      Height:      Weight:      SpO2: 100% 99% 96% 95%   Blood pressure 75/47, pulse 102, temperature 98.7 F (37.1 C), resp. rate 18, height 5' 5.5" (1.664 m), weight 74.39 kg (164 lb), last menstrual period 05/30/2013, SpO2 95 %. PSYCH: She is alert and oriented x4; does not appear anxious does not appear depressed; affect is normal HEENT: Normocephalic and Atraumatic, Mucous membranes pink; PERRLA; EOM intact; Fundi:  Benign;  No scleral icterus, Nares: Patent, Oropharynx: Clear, Fair Dentition,    Neck:  FROM, No Cervical Lymphadenopathy nor Thyromegaly or Carotid Bruit; No JVD; Breasts:: Not examined CHEST WALL: No tenderness CHEST: Normal respiration, clear to auscultation bilaterally HEART: Regular rate and rhythm; no murmurs rubs or gallops BACK: No kyphosis or scoliosis; No CVA tenderness ABDOMEN: Positive Bowel Sounds, Soft Non-Tender, No Rebound or Guarding; No Masses, No Organomegaly.   Rectal Exam: Not done EXTREMITIES: No Cyanosis, Clubbing, or Edema; No Ulcerations. Genitalia: not examined PULSES: 2+ and symmetric SKIN: Normal hydration no rash or ulceration CNS:  Alert and Oriented x 4, No Focal Deficits Vascular: pulses palpable  throughout    Labs on Admission:  Basic Metabolic Panel:  Recent Labs Lab 02/18/15 1055 02/21/15 2100  NA 138 138  K 3.7 3.7  CL  --  106  CO2 24 24  GLUCOSE 114 106*  BUN 10.1 8  CREATININE 0.7 0.76  CALCIUM 9.5 9.8   Liver Function Tests:  Recent Labs Lab 02/18/15 1055 02/21/15 2100  AST 22 31  ALT 10 20  ALKPHOS 119 123*  BILITOT 0.40 0.6  PROT 8.1 8.1  ALBUMIN 3.0* 3.2*   No results for input(s): LIPASE, AMYLASE in the last 168 hours. No results for input(s): AMMONIA in the last 168 hours. CBC:  Recent Labs Lab 02/18/15 1055 02/21/15 2100  WBC 2.2* 1.6*  NEUTROABS 1.7 0.9*  HGB 9.8* 8.9*  HCT 30.4* 27.0*  MCV 83.5 82.1  PLT 246 142*   Cardiac Enzymes: No results for input(s): CKTOTAL, CKMB, CKMBINDEX, TROPONINI in the last 168 hours.  BNP (last 3 results) No results for input(s): BNP in the last 8760 hours.  ProBNP (last 3 results) No results for input(s): PROBNP in the last 8760 hours.  CBG: No results for input(s): GLUCAP in the last 168 hours.  Radiological Exams on Admission: No results found.   EKG: Independently reviewed.    Assessment/Plan:   44 y.o. female with  Principal Problem:   1.    Sepsis   Antibiotic Coverage expanded for Sepsis Workup    IV Vancomycin and Cefepime       Active Problems:   2.   Hypotension   Fentanyl Patch removed   IVFs for Fluid Resuscitation   Check Cortisol and TSH Levels     3.   Tachycardia-   Improving with IVFs      4.   UTI (lower urinary tract infection)   Covered by Antibiotics in #1     5.   Hematuria- due to #1, and #7.   Monitor     6.   Pancytopenia due to antineoplastic chemotherapy   Monitor Trends of Cell Lines     7.   DVT (deep venous thrombosis)/ Renal Vein Thrombosis   On Coumadin Rx, but on hold due to # 7  Monitor PT/INRs     8.   Supratherapeutic INR   Coumadin on Hold   Monitor PT/INRs     9.   Stage IV carcinoma of breast   On  Chemotherapy    Oncologist is Dr Jana Hakim   10.   DVT Prophylaxis   On coumadin Rx and currenlty Supra-Therapeutic          Code Status:     FULL CODE      Family Communication:  No Family Present    Disposition Plan:    Inpatient Status        Time spent:  [facility] Pager [phone]   If [place] Please Contact the Day Rounding Team MD for [facility]  If 7PM-7AM, Please Contact Night-Floor Coverage  [email or web address] Password [email or web address] 02/22/2015, 4:09 AM     ADDENDUM:   Patient was seen and examined on 02/22/2015

## 2015-02-22 NOTE — ED Notes (Signed)
nss  1/2 infused

## 2015-02-22 NOTE — Progress Notes (Signed)
Pt arrived from ED via stretcher. CHG bath given and MRSA swab sent to lab. Vital signs: BP 96/61 and HR 110. Will continue to monitor.   Hart Rochester, RN, BSN

## 2015-02-22 NOTE — Consult Note (Signed)
Reason for consult: Breast cancer, Pancytopenia and thrombosis.  HPI: 44 year old woman with history of breast cancer dating back to May 2014. She has a triple negative cancer that was treated initially with neoadjuvant chemotherapy with doxorubicin and cyclophosphamide subsequently carboplatin and paclitaxel and then carboplatin and Gemzar. She underwent left modified radical mastectomy in December 2014. T3 N0 tumor. She will received adjuvant radiation therapy with Xeloda. 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 She is chronically anticoagulated with Coumadin. She was diagnosed with metastatic breast cancer in July 2015 and currently receiving CMF chemotherapy last time was given on 02/10/2014. She presented on 02/22/2015 with hematuria and pancytopenia. She was started on broad-spectrum antibiotics and Coumadin was held. We were asked to comment about her cancer history, thrombosis history. Clinically, she feels very well this morning. She does not report any headaches, blurry vision, syncope or seizures. She does not report any fevers, chills, sweats or weight loss. She does not report any nausea, vomiting, abdominal pain. She does not report any chest pain, palpitation, leg edema. She does not report any cough or hemoptysis or hematemesis. She does not report any early satiety or abdominal distention. She continues to have hematuria but no dysuria. She does not report any epistaxis or lymphadenopathy. Rest of her review of systems unremarkable.   Past Medical History  Diagnosis Date  . Breast cancer     Invasive High Grade Carcinoma  . Headache(784.0)     Hx: of Migraines  . Status post chemotherapy     Adriamycin and Cytoxan  . Anxiety     ANXIOUS ABOUT HAVEING BIG SURGERY - MASTECTOMY  . 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 DVT (deep venous thrombosis)     RT ARM-PT ON LOVENOX  . Hx of iron  deficiency anemia 11/26/13  . S/P radiation therapy 02-04-14 to 03-18-14                               1) Left Chest Wall / 50 Gy in 25 fractions/ 2) Left Supraclavicular fossa/ 46 Gy in 23 fractions / 3) Left Posterior Axillary boost / 8.533 Gy in 23 fractions/ 4) Left Chest Wall Scar boost / 10 Gy in 5 fractions  :  Past Surgical History  Procedure Laterality Date  . Tubal ligation    . Portacath placement Right 05/22/2013    Procedure: INSERTION PORT-A-CATH;  Surgeon: [name], MD;  Location: [place];  Service: General;  Laterality: Right;  . Left breast needle core biopsy Left 05/21/13  . Left breast needle core  biopsy Left 06/22/13  . Mastectomy modified radical Left 11/15/2013    Procedure: LEFT MASTECTOMY MODIFIED RADICAL;  Surgeon: [name], MD;  Location: [facility];  Service: General;  Laterality: Left;  :   Current facility-administered medications:  .  0.9 %  sodium chloride infusion, , Intravenous, STAT, Julianne Rice, MD, Last Rate: 125 mL/hr at 02/22/15 0524 .  0.9 %  sodium chloride infusion, , Intravenous, Continuous, Theressa Millard, MD, Last Rate: 125 mL/hr at 02/22/15 0751 .  0.9 %  sodium chloride infusion, , Intravenous, Once, Saima Rizwan, MD .  0.9 %  sodium chloride infusion, , Intravenous, Once, Debbe Odea, MD .  acetaminophen (TYLENOL) tablet 650 mg, 650 mg, Oral, Q6H PRN **OR** acetaminophen (TYLENOL) suppository 650 mg, 650 mg, Rectal, Q6H PRN,  Theressa Millard, MD .  alum & mag hydroxide-simeth (MAALOX/MYLANTA) 200-200-20 MG/5ML suspension 30 mL, 30 mL, Oral, Q6H PRN, Theressa Millard, MD .  ceFEPIme (MAXIPIME) 1 g in dextrose 5 % 50 mL IVPB, 1 g, Intravenous, 3 times per day, Laren Everts, RPH, 1 g at 02/22/15 0644 .  cyclobenzaprine (FLEXERIL) tablet 5 mg, 5 mg, Oral, TID PRN, Theressa Millard, MD .  feeding supplement (ENSURE COMPLETE) (ENSURE COMPLETE) liquid 237 mL, 237 mL, Oral, BID BM, Saima Rizwan, MD .  HYDROmorphone (DILAUDID)  injection 0.5-1 mg, 0.5-1 mg, Intravenous, Q3H PRN, Theressa Millard, MD .  LORazepam (ATIVAN) tablet 0.5 mg, 0.5 mg, Oral, Q6H PRN, Theressa Millard, MD .  ondansetron (ZOFRAN) tablet 4 mg, 4 mg, Oral, Q6H PRN **OR** ondansetron (ZOFRAN) injection 4 mg, 4 mg, Intravenous, Q6H PRN, Theressa Millard, MD .  oxyCODONE (Oxy IR/ROXICODONE) immediate release tablet 5 mg, 5 mg, Oral, Q4H PRN, Theressa Millard, MD .  potassium chloride SA (K-DUR,KLOR-CON) CR tablet 40 mEq, 40 mEq, Oral, Q4H PRN, Debbe Odea, MD .  vancomycin (VANCOCIN) IVPB 750 mg/150 ml premix, 750 mg, Intravenous, Q8H, Veronda P Bryk, RPH  Facility-Administered Medications Ordered in Other Encounters:  .  topical emolient (BIAFINE) emulsion, , Topical, Daily, [name], MD:  Allergies  Allergen Reactions  . Doxycycline Nausea And Vomiting  :  Family History  Problem Relation Age of Onset  . Hypertension Mother   . Diabetes Father 76  . Stomach cancer Maternal Aunt   . Hypertension Maternal Grandmother   . Hypertension Maternal Grandfather   . Diabetes Paternal Grandmother   . Diabetes Paternal Grandfather   . Diabetes Paternal Uncle   . Diabetes Paternal Uncle   . Diabetes Paternal Uncle   :  History   Social History  . Marital Status: Single    Spouse Name: N/A  . Number of Children: 3  . Years of Education: N/A   Occupational History  .     Social History Main Topics  . Smoking status: Never Smoker   . Smokeless tobacco: Never Used  . Alcohol Use: No  . Drug Use: No  . Sexual Activity: Yes   Other Topics Concern  . Not on file   Social History Narrative  :  Pertinent items are noted in HPI.  Exam: Blood pressure 100/64, pulse 99, temperature 97.1 F (36.2 C), temperature source Axillary, resp. rate 20, height 5' 5.5" (1.664 m), weight 164 lb (74.39 kg), last menstrual period 05/30/2013, SpO2 98 %. General appearance: alert and cooperative Head: Normocephalic, without obvious  abnormality Nose: Nares normal. Septum midline. Mucosa normal. No drainage or sinus tenderness. Throat: lips, mucosa, and tongue normal; teeth and gums normal Neck: no adenopathy Back: negative Resp: clear to auscultation bilaterally Chest wall: no tenderness Cardio: regular rate and rhythm, S1, S2 normal, no murmur, click, rub or gallop GI: soft, non-tender; bowel sounds normal; no masses,  no organomegaly  extremities: No edema.   Recent Labs  02/22/15 0700 02/22/15 0754  WBC 1.2* 1.3*  HGB 6.9* 7.3*  HCT 21.1* 23.3*  PLT 108* 114*    Recent Labs  02/21/15 2100 02/22/15 0700  NA 138 137  K 3.7 3.1*  CL 106 113*  CO2 24 22  GLUCOSE 106* 148*  BUN 8 7  CREATININE 0.76 0.63  CALCIUM 9.8 7.9*      Ct Chest W Contrast  01/28/2015   ADDENDUM REPORT: 01/28/2015 17:35  ADDENDUM: Thrombus measures  36 mm along the left renal vein and 12 mm in thickness. Thrombus mildly expands the vein.  Findings conveyed toFeng, MDon 01/28/2015  at17:35.   Electronically Signed   By: Suzy Bouchard M.D.   On: 01/28/2015 17:35   01/28/2015   CLINICAL DATA:  Left breast cancer subsequent treatment strategy. Chemotherapy in progress. Radiation therapy complete. 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  EXAM: CT CHEST, ABDOMEN, AND PELVIS WITH CONTRAST  TECHNIQUE: Multidetector CT imaging of the chest, abdomen and pelvis was performed following the standard protocol during bolus administration of intravenous contrast.  CONTRAST:  147mL OMNIPAQUE IOHEXOL 300 MG/ML  SOLN  COMPARISON:  11/04/2014  FINDINGS: CT CHEST FINDINGS  Mediastinum/Nodes: No axillary supraclavicular lymphadenopathy. No mediastinal hilar lymphadenopathy. No pericardial fluid.  Lungs/Pleura: There is a rind of pleural thickening with the right hemi thorax with multiple nodules consists with metastatic disease. The rind like nodular thickening occupies the entirety of the pleural surface. There does appear  mild improvement of the nodularity. For example nodule along the right bronchus intermedius measures 12 mm short axis compared to 24 mm on prior (image 27, series 2). Nodule along the right upper mediastinal pleural surface measures 12 x 8 mm compared to 17 x 9 mm. The nodule pleural thickening along the oblique fissure inferiorly. For example 21 mm nodule on image 37, series is 2 compares to 24 mm on prior. Nodule in the right upper lobe abutting pleural surface measures 20 mm compared to 18 mm on prior. Small pericardial effusion. The right left lung is clear disease.  Musculoskeletal: No aggressive osseous lesion.  CT ABDOMEN AND PELVIS FINDINGS  Hepatobiliary: There is extension of the right pleural metastasis deep into the sulcus on the right with nodularity along the right abdomen wall. Direct extension into the liver parenchyma from this deep pleural metastasis. Metastatic lesion measures 29 x 21 mm in the right hepatic lobe compared to 32 x 20 mm on prior. There is benign fatty infiltration along the falciform ligament.  There are multiple irregular gallstones in the gallbladder. The largest measures 17 mm.  Pancreas: Pancreas is normal. No ductal dilatation. No pancreatic inflammation.  Spleen: Normal spleen.  Adrenals/Urinary Tract: Adrenal glands are normal. Kidneys are ureters bladder normal.  Stomach/Bowel: Stomach, small bowel, and cecum normal. The colon and rectosigmoid colon are normal.  Vascular/Lymphatic: Abdominal or is normal caliber. There is filling defect within the left renal vein on the portal venous phase of imaging. This persists on the delayed renal phase imaging (image 17, series 4. There is a second left retro aortic renal vein which is patent. The kidneys enhance symmetrically.  No retroperitoneal periportal lymphadenopathy. No pelvic lymphadenopathy  Reproductive: Uterus and ovaries are normal.  Other: No free-fluid pattern pelvis. No peritoneal disease or omental metastasis   Musculoskeletal: Aggressive osseous lesion.  IMPRESSION: Chest Impression:  1. 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 Some lesions measures slightly smaller while others measures slightly larger. No evidence of progression per se. 2. No mediastinal lymphadenopathy  Abdomen / Pelvis Impression:  1. Extension of the pleural metastasis deep into the sulcus of the right lateral pleural space. Nodularity along the right lateral abdominal wall consistent metastatic extension into the peritoneum. 2. Direct extension to the right hepatic lobe from this pleural metastasis not changed from prior. 3. Filling defect within the left renal vein most consistent with bland thrombus. Accessory left retro aortic renal vein on  the left.  Electronically Signed: By: Suzy Bouchard M.D. On: 01/28/2015 16:52   Ct Abdomen Pelvis W Contrast  01/28/2015   ADDENDUM REPORT: 01/28/2015 17:35  ADDENDUM: Thrombus measures 36 mm along the left renal vein and 12 mm in thickness. Thrombus mildly expands the vein.  Findings conveyed toFeng, MDon 01/28/2015  at17:35.   Electronically Signed   By: Suzy Bouchard M.D.   On: 01/28/2015 17:35   01/28/2015   CLINICAL DATA:  Left breast cancer subsequent treatment strategy. Chemotherapy in progress. Radiation therapy complete. 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  EXAM: CT CHEST, ABDOMEN, AND PELVIS WITH CONTRAST  TECHNIQUE: Multidetector CT imaging of the chest, abdomen and pelvis was performed following the standard protocol during bolus administration of intravenous contrast.  CONTRAST:  173mL OMNIPAQUE IOHEXOL 300 MG/ML  SOLN  COMPARISON:  11/04/2014  FINDINGS: CT CHEST FINDINGS  Mediastinum/Nodes: No axillary supraclavicular lymphadenopathy. No mediastinal hilar lymphadenopathy. No pericardial fluid.  Lungs/Pleura: There is a rind of pleural thickening with the right hemi thorax with multiple  nodules consists with metastatic disease. The rind like nodular thickening occupies the entirety of the pleural surface. There does appear mild improvement of the nodularity. For example nodule along the right bronchus intermedius measures 12 mm short axis compared to 24 mm on prior (image 27, series 2). Nodule along the right upper mediastinal pleural surface measures 12 x 8 mm compared to 17 x 9 mm. The nodule pleural thickening along the oblique fissure inferiorly. For example 21 mm nodule on image 37, series is 2 compares to 24 mm on prior. Nodule in the right upper lobe abutting pleural surface measures 20 mm compared to 18 mm on prior. Small pericardial effusion. The right left lung is clear disease.  Musculoskeletal: No aggressive osseous lesion.  CT ABDOMEN AND PELVIS FINDINGS  Hepatobiliary: There is extension of the right pleural metastasis deep into the sulcus on the right with nodularity along the right abdomen wall. Direct extension into the liver parenchyma from this deep pleural metastasis. Metastatic lesion measures 29 x 21 mm in the right hepatic lobe compared to 32 x 20 mm on prior. There is benign fatty infiltration along the falciform ligament.  There are multiple irregular gallstones in the gallbladder. The largest measures 17 mm.  Pancreas: Pancreas is normal. No ductal dilatation. No pancreatic inflammation.  Spleen: Normal spleen.  Adrenals/Urinary Tract: Adrenal glands are normal. Kidneys are ureters bladder normal.  Stomach/Bowel: Stomach, small bowel, and cecum normal. The colon and rectosigmoid colon are normal.  Vascular/Lymphatic: Abdominal or is normal caliber. There is filling defect within the left renal vein on the portal venous phase of imaging. This persists on the delayed renal phase imaging (image 17, series 4. There is a second left retro aortic renal vein which is patent. The kidneys enhance symmetrically.  No retroperitoneal periportal lymphadenopathy. No pelvic  lymphadenopathy  Reproductive: Uterus and ovaries are normal.  Other: No free-fluid pattern pelvis. No peritoneal disease or omental metastasis  Musculoskeletal: Aggressive osseous lesion.  IMPRESSION: Chest Impression:  1. 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 Some lesions measures slightly smaller while others measures slightly larger. No evidence of progression per se. 2. No mediastinal lymphadenopathy  Abdomen / Pelvis Impression:  1. Extension of the pleural metastasis deep into the sulcus of the right lateral pleural space. Nodularity along the right lateral abdominal wall consistent metastatic extension into the peritoneum. 2. Direct extension to  the right hepatic lobe from this pleural metastasis not changed from prior. 3. Filling defect within the left renal vein most consistent with bland thrombus. Accessory left retro aortic renal vein on the left.  Electronically Signed: By: Suzy Bouchard M.D. On: 01/28/2015 16:52    Assessment and Plan:   44 year old woman with the following issues:  1. Breast cancer with metastatic disease as documented by CT scan of the chest abdomen and pelvis done in February 2016. She is currently on CMF chemotherapy last cycle given on 02/11/2015. Her second cycle was not scheduled for 02/2015. Her subsequent chemotherapy will be on hold till the current episode subsides.  2. Pancytopenia: Related to systemic chemotherapy and possibly she is anemic from hematuria. I agree with packed red cell transfusions but no platelet transfusion or growth factor support is needed at this time. Her platelet count is adequate.   3. History of thrombosis: Her INR continues to be supratherapeutic and I will continue to hold Coumadin until she is within therapeutic range. I will restart her Coumadin after her hematuria has subsided as well.  4. Possible UTI and sepsis: I agree with broad-spectrum antibiotics  until her neutropenia resolves and an organism has been identified.  Appreciate the care from the hospitalist team and I agree with the current management.

## 2015-02-22 NOTE — Progress Notes (Signed)
CRITICAL VALUE ALERT  Critical value received: wbc 1.2 Hgb 6.9  Date of notification:  02/22/15  Time of notification:  0740  Critical value read back:yes  Nurse who received alert:  Honor Loh RN   MD notified (1st page):  Dr Wynelle Cleveland text paged

## 2015-02-22 NOTE — ED Notes (Signed)
Fentanyl  Patch removed poer oorder of dr Arnoldo Morale.    Attempting to get her bp higher

## 2015-02-23 DIAGNOSIS — I82409 Acute embolism and thrombosis of unspecified deep veins of unspecified lower extremity: Secondary | ICD-10-CM

## 2015-02-23 DIAGNOSIS — I951 Orthostatic hypotension: Secondary | ICD-10-CM

## 2015-02-23 LAB — BASIC METABOLIC PANEL
ANION GAP: 5 (ref 5–15)
BUN: 8 mg/dL (ref 6–23)
CO2: 25 mmol/L (ref 19–32)
Calcium: 9.2 mg/dL (ref 8.4–10.5)
Chloride: 109 mmol/L (ref 96–112)
Creatinine, Ser: 0.81 mg/dL (ref 0.50–1.10)
GFR, EST NON AFRICAN AMERICAN: 87 mL/min — AB (ref >90)
Glucose, Bld: 109 mg/dL — ABNORMAL HIGH (ref 70–99)
Potassium: 3.7 mmol/L (ref 3.5–5.1)
SODIUM: 139 mmol/L (ref 135–145)

## 2015-02-23 LAB — TYPE AND SCREEN
ABO/RH(D): A POS
Antibody Screen: NEGATIVE
Unit division: 0

## 2015-02-23 LAB — CBC
HCT: 26.6 % — ABNORMAL LOW (ref 36.0–46.0)
Hemoglobin: 8.5 g/dL — ABNORMAL LOW (ref 12.0–15.0)
MCH: 26.6 pg (ref 26.0–34.0)
MCHC: 32 g/dL (ref 30.0–36.0)
MCV: 83.1 fL (ref 78.0–100.0)
Platelets: 119 10*3/uL — ABNORMAL LOW (ref 150–400)
RBC: 3.2 MIL/uL — ABNORMAL LOW (ref 3.87–5.11)
RDW: 22.1 % — AB (ref 11.5–15.5)
WBC: 1.3 10*3/uL — AB (ref 4.0–10.5)

## 2015-02-23 LAB — PROTIME-INR
INR: 3.88 — ABNORMAL HIGH (ref 0.00–1.49)
Prothrombin Time: 38.4 seconds — ABNORMAL HIGH (ref 11.6–15.2)

## 2015-02-23 MED ORDER — CEFPODOXIME PROXETIL 200 MG PO TABS
200.0000 mg | ORAL_TABLET | Freq: Two times a day (BID) | ORAL | Status: DC
  Administered 2015-02-23 – 2015-02-24 (×2): 200 mg via ORAL
  Filled 2015-02-23 (×3): qty 1

## 2015-02-23 MED ORDER — CEFPODOXIME PROXETIL 200 MG PO TABS: 200.0000 mg | ORAL_TABLET | Freq: Two times a day (BID) | ORAL | Status: DC

## 2015-02-23 NOTE — Progress Notes (Signed)
Events overnight noted. Laboratory data were reviewed today and her hemoglobin is 8.5, white cell count of 1.3. Her blood count is up to 119.  Her INR is 6.12 and certainly still supratherapeutic.  I recommend continuing with the current management and I do not see any need for transfusions at this time. There is no need for any growth factor support unless she develops sepsis that I will start Neupogen at that time.  Given her history of thrombosis, I will not attempt to reverse her supratherapeutic INR rather than limited drift naturally.

## 2015-02-23 NOTE — Progress Notes (Addendum)
[facility] Progress Note   Erika [name] TML:[id number] DOB: 12/20/1970 DOA: 02/21/2015 PCP: No PCP Per Patient  Brief narrative: Erika [name] is a 44 y.o. female admitted for hematuria and found to have a UTI   Subjective: Feels well today. No complaints. 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 last month- started on Coumadin by Oncology- INR Is supra therapeutic therefore it is on hold- cont to follow INR- have consulted ONC to see if it is safe to continue it in setting of thrombocytopenia- they agree with continuing Coumadin   Hypotension/ tachycardia- due to dehydration on admission -Fentanyl patch removed by admitting physician- not in any pain- BP improved after 3 L NS   Hematuria - in relation to UTI and supratherapeutic INR- now resolved- cont Coumadin per pharmacy   Stage IV carcinoma of breast - outpt f/u with Onc- have placed Dr Erika [name] as Optometrist in [place]   Code Status: full code Family Communication:  Disposition Plan: home tomorrow if stable DVT prophylaxis: Coumadin Consultants:oncology Procedures:none  Antibiotics: Anti-infectives    Start     Dose/Rate Route Frequency Ordered Stop   02/22/15 1200  vancomycin (VANCOCIN) IVPB 750 mg/150 ml premix     750 mg 150 mL/hr over 60 Minutes Intravenous Every 8 hours 02/22/15 0438     02/22/15 0600  ceFEPIme (MAXIPIME) 1 g in dextrose 5 % 50 mL IVPB     1 g 100 mL/hr over  30 Minutes Intravenous 3 times per day 02/22/15 0438     02/22/15 0445  vancomycin (VANCOCIN) IVPB 750 mg/150 ml premix     750 mg 150 mL/hr over 60 Minutes Intravenous  Once 02/22/15 0438 02/22/15 0617   02/21/15 2330  cefTRIAXone (ROCEPHIN) 1 g in dextrose 5 % 50 mL IVPB     1 g 100 mL/hr over 30 Minutes Intravenous  Once 02/21/15 2319 02/22/15 0023      Objective: Filed Weights   02/21/15 2040  Weight: 74.39 kg (164 lb)    Intake/Output Summary (Last 24 hours) at 02/23/15 1013 Last data filed at 02/23/15 0732  Gross per 24 hour  Intake 1494.17 ml  Output   2950 ml  Net -1455.83 ml     Vitals Filed Vitals:   02/23/15 0600 02/23/15 0700 02/23/15 0800 02/23/15 0859  BP: 120/81 112/69 106/80 106/75  Pulse: 102 89 90 95  Temp:    97.9 F (36.6 C)  TempSrc:    Oral  Resp: 18 23 23 24   Height:      Weight:      SpO2: 100% 97% 97% 100%    Exam:  General:  Pt is alert, not in acute distress  HEENT: No icterus, No thrush  Cardiovascular: regular rate and rhythm, S1/S2 No murmur  Respiratory: clear to auscultation bilaterally   Abdomen: Soft, +Bowel sounds, non tender, non distended, no guarding  MSK: No LE edema, cyanosis or clubbing  Data Reviewed: Basic Metabolic Panel:  Recent Labs Lab 02/18/15  1055 02/21/15 2100 02/22/15 0700 02/23/15 0422  NA 138 138 137 139  K 3.7 3.7 3.1* 3.7  CL  --  106 113* 109  CO2 24 24 22 25   GLUCOSE 114 106* 148* 109*  BUN 10.1 8 7 8   CREATININE 0.7 0.76 0.63 0.81  CALCIUM 9.5 9.8 7.9* 9.2   Liver Function Tests:  Recent Labs Lab 02/18/15 1055 02/21/15 2100  AST 22 31  ALT 10 20  ALKPHOS 119 123*  BILITOT 0.40 0.6  PROT 8.1 8.1  ALBUMIN 3.0* 3.2*   No results for input(s): LIPASE, AMYLASE in the last 168 hours. No results for input(s): AMMONIA in the last 168 hours. CBC:  Recent Labs Lab 02/18/15 1055 02/21/15 2100 02/22/15 0700 02/22/15 0754 02/22/15 1740 02/23/15 0422  WBC 2.2* 1.6* 1.2* 1.3*  --   1.3*  NEUTROABS 1.7 0.9*  --   --   --   --   HGB 9.8* 8.9* 6.9* 7.3* 8.3* 8.5*  HCT 30.4* 27.0* 21.1* 23.3* 25.3* 26.6*  MCV 83.5 82.1 83.4 85.0  --  83.1  PLT 246 142* 108* 114*  --  119*   Cardiac Enzymes: No results for input(s): CKTOTAL, CKMB, CKMBINDEX, TROPONINI in the last 168 hours. BNP (last 3 results) No results for input(s): BNP in the last 8760 hours.  ProBNP (last 3 results) No results for input(s): PROBNP in the last 8760 hours.  CBG: No results for input(s): GLUCAP in the last 168 hours.  Recent Results (from the past 240 hour(s))  MRSA PCR Screening     Status: None   Collection Time: 02/22/15  5:56 AM  Result Value Ref Range Status   MRSA by PCR NEGATIVE NEGATIVE Final    Comment:        The GeneXpert MRSA Assay (FDA approved for NASAL specimens only), is one component of a comprehensive MRSA colonization surveillance program. It is not intended to diagnose MRSA infection nor to guide or monitor treatment for MRSA infections.      Studies:  Recent x-ray studies have been reviewed in detail by the Attending Physician  Scheduled Meds:  Scheduled Meds: . ceFEPime (MAXIPIME) IV  1 g Intravenous 3 times per day  . feeding supplement (ENSURE COMPLETE)  237 mL Oral BID BM  . vancomycin  750 mg Intravenous Q8H   Continuous Infusions: . sodium chloride 125 mL/hr at 02/22/15 1756    Time spent on care of this patient: 35 min   [place], MD 02/23/2015, 10:13 AM  LOS: 1 day   [facility] Office  [phone] Pager - Text Page per [email or web address]  If 7PM-7AM, please contact night-coverage [email or web address]

## 2015-02-23 NOTE — Progress Notes (Signed)
ANTICOAGULATION CONSULT NOTE - Initial Consult  Pharmacy Consult for Warfarin Indication: DVT  Allergies  Allergen Reactions  . Doxycycline Nausea And Vomiting    Patient Measurements: Height: 5' 5.5" (166.4 cm) Weight: 164 lb (74.39 kg) IBW/kg (Calculated) : 58.15  Vital Signs: Temp: 97.9 F (36.6 C) (03/13 0859) Temp Source: Oral (03/13 0859) BP: 106/75 mmHg (03/13 0900) Pulse Rate: 93 (03/13 0900)  Labs:  Recent Labs  02/21/15 2100 02/22/15 0700 02/22/15 0754 02/22/15 1740 02/23/15 0422  HGB 8.9* 6.9* 7.3* 8.3* 8.5*  HCT 27.0* 21.1* 23.3* 25.3* 26.6*  PLT 142* 108* 114*  --  119*  LABPROT 36.8*  --   --   --  38.4*  INR 3.68*  --   --   --  3.88*  CREATININE 0.76 0.63  --   --  0.81    Estimated Creatinine Clearance: 90.5 mL/min (by C-G formula based on Cr of 0.81).   Medical History: Past Medical History  Diagnosis Date  . Breast cancer     Invasive High Grade Carcinoma  . Headache(784.0)     Hx: of Migraines  . Status post chemotherapy     Adriamycin and Cytoxan  . Anxiety     ANXIOUS ABOUT HAVEING BIG SURGERY - MASTECTOMY  . 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 DVT (deep venous thrombosis)     RT ARM-PT ON LOVENOX  . Hx of iron deficiency anemia 11/26/13  . S/P radiation therapy 02-04-14 to 03-18-14                               1) Left Chest Wall / 50 Gy in 25 fractions/ 2) Left Supraclavicular fossa/ 46 Gy in 23 fractions / 3) Left Posterior Axillary boost / 8.533 Gy in 23 fractions/ 4) Left Chest Wall Scar boost / 10 Gy in 5 fractions     Assessment: 44yo female w/ breast ca on chemo c/o blood in urine, on PTA Coumadin for renal vein thrombosis w/ INR slightly supratherapeutic on admit (3.68) though was >5 recently.  INR today remains supratherapeutic at 3.88, H&H improved post PRBC, PLT low but trending up, no bleeding reported.  Pancytopenia due to antineoplastic chemotherapy, 1 unit PRBC 3/12 Hematuria thought to be related to UTI +  supratherapeutic INR - now resolved  PTA dose 5mg  daily (per anticoag note 3/8, but INR 5.7 that day in clinic w/o decreasing weekly dose)  Goal of Therapy:  INR 2-3 Monitor platelets by anticoagulation protocol: Yes   Plan:  - Hold warfarin tonight - Resume warfarin when INR < 3 - Monitor INR, CBC, s/sx of bleeding  Drucie Opitz, PharmD Clinical Pharmacy Resident Pager: [phone] 02/23/2015 12:37 PM

## 2015-02-23 NOTE — Progress Notes (Signed)
Report called to 5N RN 

## 2015-02-24 ENCOUNTER — Ambulatory Visit: Payer: PRIVATE HEALTH INSURANCE

## 2015-02-24 ENCOUNTER — Telehealth: Payer: Self-pay | Admitting: *Deleted

## 2015-02-24 ENCOUNTER — Other Ambulatory Visit: Payer: PRIVATE HEALTH INSURANCE

## 2015-02-24 LAB — CORTISOL: Cortisol, Plasma: 11.1 ug/dL

## 2015-02-24 LAB — URINALYSIS, ROUTINE W REFLEX MICROSCOPIC
Bilirubin Urine: NEGATIVE
Glucose, UA: NEGATIVE mg/dL
Hgb urine dipstick: NEGATIVE
KETONES UR: NEGATIVE mg/dL
NITRITE: NEGATIVE
Protein, ur: NEGATIVE mg/dL
Specific Gravity, Urine: 1.013 (ref 1.005–1.030)
UROBILINOGEN UA: 0.2 mg/dL (ref 0.0–1.0)
pH: 5 (ref 5.0–8.0)

## 2015-02-24 LAB — CBC
HCT: 26.2 % — ABNORMAL LOW (ref 36.0–46.0)
Hemoglobin: 8.4 g/dL — ABNORMAL LOW (ref 12.0–15.0)
MCH: 27.3 pg (ref 26.0–34.0)
MCHC: 32.1 g/dL (ref 30.0–36.0)
MCV: 85.1 fL (ref 78.0–100.0)
PLATELETS: 92 10*3/uL — AB (ref 150–400)
RBC: 3.08 MIL/uL — ABNORMAL LOW (ref 3.87–5.11)
RDW: 22.5 % — AB (ref 11.5–15.5)
WBC: 1.1 10*3/uL — AB (ref 4.0–10.5)

## 2015-02-24 LAB — URINE MICROSCOPIC-ADD ON

## 2015-02-24 LAB — BASIC METABOLIC PANEL
ANION GAP: 8 (ref 5–15)
BUN: 7 mg/dL (ref 6–23)
CALCIUM: 9.2 mg/dL (ref 8.4–10.5)
CHLORIDE: 103 mmol/L (ref 96–112)
CO2: 26 mmol/L (ref 19–32)
Creatinine, Ser: 0.67 mg/dL (ref 0.50–1.10)
GFR calc Af Amer: >90 mL/min (ref >90)
GFR calc non Af Amer: >90 mL/min (ref >90)
Glucose, Bld: 94 mg/dL (ref 70–99)
Potassium: 4.1 mmol/L (ref 3.5–5.1)
Sodium: 137 mmol/L (ref 135–145)

## 2015-02-24 LAB — PATHOLOGIST SMEAR REVIEW

## 2015-02-24 LAB — PROTIME-INR
INR: 2.94 — ABNORMAL HIGH (ref 0.00–1.49)
PROTHROMBIN TIME: 30.9 s — AB (ref 11.6–15.2)

## 2015-02-24 MED ORDER — WARFARIN SODIUM 1 MG PO TABS
1.0000 mg | ORAL_TABLET | Freq: Once | ORAL | Status: AC
  Administered 2015-02-24: 1 mg via ORAL
  Filled 2015-02-24: qty 1

## 2015-02-24 MED ORDER — CEFPODOXIME PROXETIL 200 MG PO TABS: 200.0000 mg | ORAL_TABLET | Freq: Two times a day (BID) | ORAL | Status: DC

## 2015-02-24 MED ORDER — WARFARIN - PHARMACIST DOSING INPATIENT: Freq: Every day | Status: DC

## 2015-02-24 NOTE — Progress Notes (Signed)
ANTICOAGULATION CONSULT NOTE - FOLLOW UP  Pharmacy Consult:  Coumadin  Indication:  History of DVT  Allergies  Allergen Reactions  . Doxycycline Nausea And Vomiting    Patient Measurements: Height: 5' 5.5" (166.4 cm) Weight: 164 lb (74.39 kg) IBW/kg (Calculated) : 58.15  Vital Signs: Temp: 98.5 F (36.9 C) (03/14 0457) Temp Source: Oral (03/14 0457) BP: 90/62 mmHg (03/14 0457) Pulse Rate: 84 (03/14 0457)  Labs:  Recent Labs  02/21/15 2100 02/22/15 0700 02/22/15 0754 02/22/15 1740 02/23/15 0422 02/24/15 0624  HGB 8.9* 6.9* 7.3* 8.3* 8.5* 8.4*  HCT 27.0* 21.1* 23.3* 25.3* 26.6* 26.2*  PLT 142* 108* 114*  --  119* 92*  LABPROT 36.8*  --   --   --  38.4* 30.9*  INR 3.68*  --   --   --  3.88* 2.94*  CREATININE 0.76 0.63  --   --  0.81 0.67    Estimated Creatinine Clearance: 91.7 mL/min (by C-G formula based on Cr of 0.67).    Assessment: 38 YOF with breast cancer on chemotherapy presented with blood in urine.  Patient was on Coumadin PTA for renal vein thrombosis and history of right arm DVT.  INR on admit was supra-therapeutic at 3.88 and Coumadin was held.  INR now within range and will resume Coumadin tonight to prevent a significant drop.  Hematuria resolved but patient remain anemia and thrombocytopenic.   Goal of Therapy:  INR 2-3 Monitor platelets by anticoagulation protocol: Yes    Plan:  - Coumadin 1mg  PO today - Daily PT / INR    Vy Badley D. Mina Marble, PharmD, BCPS Pager:  [phone] 02/24/2015, 1:26 PM

## 2015-02-24 NOTE — Progress Notes (Signed)
RN has schedule an appointment with Dr. Ron Agee at [facility] for 3/17 at 5 pm.

## 2015-02-24 NOTE — Discharge Summary (Addendum)
Physician Discharge Summary  Erika [name] IRS:[id number] DOB: [birth year] DOA: 02/21/2015  PCP: [name], MD  Admit date: 02/21/2015 Discharge date: 02/24/2015  Time spent: 45 minutes  Recommendations for Outpatient Follow-up:  1. Needs INR checked in 3 days  Discharge Condition: stable Diet recommendation: heart healthy  Discharge Diagnoses:  Principal Problem:   Sepsis Active Problems:   DVT (deep venous thrombosis)   Hypotension   UTI (lower urinary tract infection)   Hematuria   Supratherapeutic INR   Stage IV carcinoma of breast   Pancytopenia due to antineoplastic chemotherapy   Malnutrition of moderate degree   History of present illness:  Erika [name] is a 44 y.o. 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 She denies any fevers or chills or nausea vomiting or dysuria. She was found to have a+ UA, as well as a mildly Supra-therapeutic INR. She was also hypotensive despite 3 liters of IVFs that were given she was referred for medical admission.   Hospital Course:  Sepsis due to UTI- - had tachycardia, leukopenia, hypotension with SBP in 80s -- MRSA PCR neg- stop Vanc- transition Rocephin to Vantin  - blood cultures not drawn prior to antibiotics- urine culture not sent- repeat UA ordered this AM to look for clearance-no results yet- the nurse and I have called the lab numerous times for the result but there has been no response - will cont Vantin to complete a 7 day course- will f/u on UA myself patient has appt with her PCP in 3 days for f/u   Active Problems:  Pancytopenia due to antineoplastic chemotherapy - had chemo last week - transfused 1 U PRBC- Hb steady > 8 - Oncology has evaluated her and  do not feel she need neulasta or a platelet transfusion  Hypokalemia - replaced   DVT (deep venous thrombosis) - renal vein thrombus found last month- started on Coumadin by Oncology- INR Is supra therapeutic on admission but now normal -  consulted ONC to see if it is safe to continue it in setting of thrombocytopenia- they agree with continuing Coumadin   Hypotension/ tachycardia- due to dehydration on admission -Fentanyl patch removed by admitting physician- not in any pain- BP improved after 3 L NS   Hematuria - in relation to UTI and supratherapeutic INR- family member thinks he saw some blood in the patient's urine last night but it was clear this AM- at this time her Hb is stable- greater necessity is to continue to anticoagulate- - see above under "DVT" - cont Coumadin    Stage IV carcinoma of breast - outpt f/u with Onc- have placed Dr Jana Hakim as Optometrist in [facility]   Consultations:  Oncology  Discharge Exam: Danley Danker Weights   02/21/15 2040  Weight: 74.39 kg (164 lb)   Filed Vitals:   02/24/15 0457  BP: 90/62  Pulse: 84  Temp: 98.5 F (36.9 C)  Resp: 18    General: AAO x 3, no distress Cardiovascular: RRR, no murmurs  Respiratory: clear to auscultation bilaterally GI: soft, non-tender, non-distended, bowel sound positive  Discharge Instructions You were cared for by a hospitalist during your hospital stay. If you have any questions about your discharge medications or the care you received while  you were in the hospital after you are discharged, you can call the unit and asked to speak with the hospitalist on call if the hospitalist that took care of you is not available. Once you are discharged, your primary care physician will handle any further medical issues. Please note that NO REFILLS for any discharge medications will be authorized once you are discharged, as it is imperative that you return to your primary care physician (or establish a relationship with  a primary care physician if you do not have one) for your aftercare needs so that they can reassess your need for medications and monitor your lab values.      Discharge Instructions    Diet - low sodium heart healthy    Complete by:  As directed      Discharge instructions    Complete by:  As directed   Have INR checked on next visit in 2-3 days     Increase activity slowly    Complete by:  As directed             Medication List    STOP taking these medications        HYDROcodone-homatropine 5-1.5 MG/5ML syrup  Commonly known as:  HYCODAN      TAKE these medications        cefpodoxime 200 MG tablet  Commonly known as:  VANTIN  Take 1 tablet (200 mg total) by mouth every 12 (twelve) hours.     cyclobenzaprine 5 MG tablet  Commonly known as:  FLEXERIL  TAKE 1 TABLET BY MOUTH THREE TIMES A DAY FOR MUSCLE SPASMS     fentaNYL 25 MCG/HR patch  Commonly known as:  Maloy - dosed mcg/hr  Place 1 patch (25 mcg total) onto the skin every 3 (three) days.     fentaNYL 75 MCG/HR  Commonly known as:  DURAGESIC - dosed mcg/hr  Place 1 patch (75 mcg total) onto the skin every 3 (three) days.     gabapentin 300 MG capsule  Commonly known as:  NEURONTIN  Take 1 capsule (300 mg total) by mouth 3 (three) times daily.     loratadine 10 MG tablet  Commonly known as:  CLARITIN  Take 10 mg by mouth daily as needed for allergies.     LORazepam 0.5 MG tablet  Commonly known as:  ATIVAN  Take 1 tablet (0.5 mg total) by mouth every 6 (six) hours as needed for anxiety.     ondansetron 8 MG tablet  Commonly known as:  ZOFRAN  Take by mouth every 8 (eight) hours as needed for nausea or vomiting.     oxyCODONE 5 MG immediate release tablet  Commonly known as:  Oxy IR/ROXICODONE  Take 1 tablet (5 mg total) by mouth every 8 (eight) hours as needed for severe pain or breakthrough pain.     potassium chloride SA 20 MEQ tablet  Commonly known as:  K-DUR,KLOR-CON  Take 1 tablet (20 mEq  total) by mouth 4 (four) times daily.     traMADol 50 MG tablet  Commonly known as:  ULTRAM  Take 1-2 tablets (50-100 mg total) by mouth every 6 (six) hours as needed.     UNABLE TO FIND  - Rx: L8000- Post Surgical Bra (Quantity: 6)  - E9528- Non Silicone Breast prosthesis (Quantity: 1)  - Dx: 174.9; Left mastectomy     warfarin 5 MG tablet  Commonly known as:  COUMADIN  Take 2.5-5 mg by mouth daily. Patient  is getting regulated on Coumadin.  Take 2.5 mg on 02/21/15 then take 5 mg on Saturday and Sunday then will go back to doctor.     zolpidem 5 MG tablet  Commonly known as:  AMBIEN  Take 1 tablet (5 mg total) by mouth at bedtime as needed for sleep.       Allergies  Allergen Reactions  . Doxycycline Nausea And Vomiting      The results of significant diagnostics from this hospitalization (including imaging, microbiology, ancillary and laboratory) are listed below for reference.    Significant Diagnostic Studies: Ct Chest W Contrast  01/28/2015   ADDENDUM REPORT: 01/28/2015 17:35  ADDENDUM: Thrombus measures 36 mm along the left renal vein and 12 mm in thickness. Thrombus mildly expands the vein.  Findings conveyed toFeng, MDon 01/28/2015  at17:35.   Electronically Signed   By: Suzy Bouchard M.D.   On: 01/28/2015 17:35   01/28/2015   CLINICAL DATA:  Left breast cancer subsequent treatment strategy. Chemotherapy in progress. Radiation therapy complete. 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  EXAM: CT CHEST, ABDOMEN, AND PELVIS WITH CONTRAST  TECHNIQUE: Multidetector CT imaging of the chest, abdomen and pelvis was performed following the standard protocol during bolus administration of intravenous contrast.  CONTRAST:  140mL OMNIPAQUE IOHEXOL 300 MG/ML  SOLN  COMPARISON:  11/04/2014  FINDINGS: CT CHEST FINDINGS  Mediastinum/Nodes: No axillary supraclavicular lymphadenopathy. No mediastinal hilar lymphadenopathy. No pericardial fluid.  Lungs/Pleura:  There is a rind of pleural thickening with the right hemi thorax with multiple nodules consists with metastatic disease. The rind like nodular thickening occupies the entirety of the pleural surface. There does appear mild improvement of the nodularity. For example nodule along the right bronchus intermedius measures 12 mm short axis compared to 24 mm on prior (image 27, series 2). Nodule along the right upper mediastinal pleural surface measures 12 x 8 mm compared to 17 x 9 mm. The nodule pleural thickening along the oblique fissure inferiorly. For example 21 mm nodule on image 37, series is 2 compares to 24 mm on prior. Nodule in the right upper lobe abutting pleural surface measures 20 mm compared to 18 mm on prior. Small pericardial effusion. The right left lung is clear disease.  Musculoskeletal: No aggressive osseous lesion.  CT ABDOMEN AND PELVIS FINDINGS  Hepatobiliary: There is extension of the right pleural metastasis deep into the sulcus on the right with nodularity along the right abdomen wall. Direct extension into the liver parenchyma from this deep pleural metastasis. Metastatic lesion measures 29 x 21 mm in the right hepatic lobe compared to 32 x 20 mm on prior. There is benign fatty infiltration along the falciform ligament.  There are multiple irregular gallstones in the gallbladder. The largest measures 17 mm.  Pancreas: Pancreas is normal. No ductal dilatation. No pancreatic inflammation.  Spleen: Normal spleen.  Adrenals/Urinary Tract: Adrenal glands are normal. Kidneys are ureters bladder normal.  Stomach/Bowel: Stomach, small bowel, and cecum normal. The colon and rectosigmoid colon are normal.  Vascular/Lymphatic: Abdominal or is normal caliber. There is filling defect within the left renal vein on the portal venous phase of imaging. This persists on the delayed renal phase imaging (image 17, series 4. There is a second left retro aortic renal vein which is patent. The kidneys enhance  symmetrically.  No retroperitoneal periportal lymphadenopathy. No pelvic lymphadenopathy  Reproductive: Uterus and ovaries are normal.  Other: No free-fluid pattern pelvis. No peritoneal disease or omental metastasis  Musculoskeletal: Aggressive osseous lesion.  IMPRESSION: Chest Impression:  1. 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 Some lesions measures slightly smaller while others measures slightly larger. No evidence of progression per se. 2. No mediastinal lymphadenopathy  Abdomen / Pelvis Impression:  1. Extension of the pleural metastasis deep into the sulcus of the right lateral pleural space. Nodularity along the right lateral abdominal wall consistent metastatic extension into the peritoneum. 2. Direct extension to the right hepatic lobe from this pleural metastasis not changed from prior. 3. Filling defect within the left renal vein most consistent with bland thrombus. Accessory left retro aortic renal vein on the left.  Electronically Signed: By: Suzy Bouchard M.D. On: 01/28/2015 16:52   Ct Abdomen Pelvis W Contrast  01/28/2015   ADDENDUM REPORT: 01/28/2015 17:35  ADDENDUM: Thrombus measures 36 mm along the left renal vein and 12 mm in thickness. Thrombus mildly expands the vein.  Findings conveyed toFeng, MDon 01/28/2015  at17:35.   Electronically Signed   By: Suzy Bouchard M.D.   On: 01/28/2015 17:35   01/28/2015   CLINICAL DATA:  Left breast cancer subsequent treatment strategy. Chemotherapy in progress. Radiation therapy complete. 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  EXAM: CT CHEST, ABDOMEN, AND PELVIS WITH CONTRAST  TECHNIQUE: Multidetector CT imaging of the chest, abdomen and pelvis was performed following the standard protocol during bolus administration of intravenous contrast.  CONTRAST:  160mL OMNIPAQUE IOHEXOL 300 MG/ML  SOLN  COMPARISON:  11/04/2014  FINDINGS: CT CHEST FINDINGS   Mediastinum/Nodes: No axillary supraclavicular lymphadenopathy. No mediastinal hilar lymphadenopathy. No pericardial fluid.  Lungs/Pleura: There is a rind of pleural thickening with the right hemi thorax with multiple nodules consists with metastatic disease. The rind like nodular thickening occupies the entirety of the pleural surface. There does appear mild improvement of the nodularity. For example nodule along the right bronchus intermedius measures 12 mm short axis compared to 24 mm on prior (image 27, series 2). Nodule along the right upper mediastinal pleural surface measures 12 x 8 mm compared to 17 x 9 mm. The nodule pleural thickening along the oblique fissure inferiorly. For example 21 mm nodule on image 37, series is 2 compares to 24 mm on prior. Nodule in the right upper lobe abutting pleural surface measures 20 mm compared to 18 mm on prior. Small pericardial effusion. The right left lung is clear disease.  Musculoskeletal: No aggressive osseous lesion.  CT ABDOMEN AND PELVIS FINDINGS  Hepatobiliary: There is extension of the right pleural metastasis deep into the sulcus on the right with nodularity along the right abdomen wall. Direct extension into the liver parenchyma from this deep pleural metastasis. Metastatic lesion measures 29 x 21 mm in the right hepatic lobe compared to 32 x 20 mm on prior. There is benign fatty infiltration along the falciform ligament.  There are multiple irregular gallstones in the gallbladder. The largest measures 17 mm.  Pancreas: Pancreas is normal. No ductal dilatation. No pancreatic inflammation.  Spleen: Normal spleen.  Adrenals/Urinary Tract: Adrenal glands are normal. Kidneys are ureters bladder normal.  Stomach/Bowel: Stomach, small bowel, and cecum normal. The colon and rectosigmoid colon are normal.  Vascular/Lymphatic: Abdominal or is normal caliber. There is filling defect within the left renal vein on the portal venous phase of imaging. This persists on the  delayed renal phase imaging (image 17, series 4. There is a second left retro aortic renal vein which is patent.  The kidneys enhance symmetrically.  No retroperitoneal periportal lymphadenopathy. No pelvic lymphadenopathy  Reproductive: Uterus and ovaries are normal.  Other: No free-fluid pattern pelvis. No peritoneal disease or omental metastasis  Musculoskeletal: Aggressive osseous lesion.  IMPRESSION: Chest Impression:  1. 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 Some lesions measures slightly smaller while others measures slightly larger. No evidence of progression per se. 2. No mediastinal lymphadenopathy  Abdomen / Pelvis Impression:  1. Extension of the pleural metastasis deep into the sulcus of the right lateral pleural space. Nodularity along the right lateral abdominal wall consistent metastatic extension into the peritoneum. 2. Direct extension to the right hepatic lobe from this pleural metastasis not changed from prior. 3. Filling defect within the left renal vein most consistent with bland thrombus. Accessory left retro aortic renal vein on the left.  Electronically Signed: By: Suzy Bouchard M.D. On: 01/28/2015 16:52    Microbiology: Recent Results (from the past 240 hour(s))  MRSA PCR Screening     Status: None   Collection Time: 02/22/15  5:56 AM  Result Value Ref Range Status   MRSA by PCR NEGATIVE NEGATIVE Final    Comment:        The GeneXpert MRSA Assay (FDA approved for NASAL specimens only), is one component of a comprehensive MRSA colonization surveillance program. It is not intended to diagnose MRSA infection nor to guide or monitor treatment for MRSA infections.      Labs: Basic Metabolic Panel:  Recent Labs Lab 02/18/15 1055 02/21/15 2100 02/22/15 0700 02/23/15 0422 02/24/15 0624  NA 138 138 137 139 137  K 3.7 3.7 3.1* 3.7 4.1  CL  --  106 113* 109 103  CO2 24 24 22 25 26   GLUCOSE 114  106* 148* 109* 94  BUN 10.1 8 7 8 7   CREATININE 0.7 0.76 0.63 0.81 0.67  CALCIUM 9.5 9.8 7.9* 9.2 9.2   Liver Function Tests:  Recent Labs Lab 02/18/15 1055 02/21/15 2100  AST 22 31  ALT 10 20  ALKPHOS 119 123*  BILITOT 0.40 0.6  PROT 8.1 8.1  ALBUMIN 3.0* 3.2*   No results for input(s): LIPASE, AMYLASE in the last 168 hours. No results for input(s): AMMONIA in the last 168 hours. CBC:  Recent Labs Lab 02/18/15 1055  02/21/15 2100 02/22/15 0700 02/22/15 0754 02/22/15 1740 02/23/15 0422 02/24/15 0624  WBC 2.2*  --  1.6* 1.2* 1.3*  --  1.3* 1.1*  NEUTROABS 1.7  --  0.9*  --   --   --   --   --   HGB 9.8*  < > 8.9* 6.9* 7.3* 8.3* 8.5* 8.4*  HCT 30.4*  < > 27.0* 21.1* 23.3* 25.3* 26.6* 26.2*  MCV 83.5  --  82.1 83.4 85.0  --  83.1 85.1  PLT 246  --  142* 108* 114*  --  119* 92*  < > = values in this interval not displayed. Cardiac Enzymes: No results for input(s): CKTOTAL, CKMB, CKMBINDEX, TROPONINI in the last 168 hours. BNP: BNP (last 3 results) No results for input(s): BNP in the last 8760 hours.  ProBNP (last 3 results) No results for input(s): PROBNP in the last 8760 hours.  CBG: No results for input(s): GLUCAP in the last 168 hours.     SignedDebbe Odea, MD [facility] 02/24/2015, 4:42 PM

## 2015-02-24 NOTE — Progress Notes (Signed)
Pt. Got d/c instructions and follow up appointments.Pt.IV was d/c. Ready to go home with her husband.

## 2015-02-24 NOTE — Telephone Encounter (Signed)
Received call from pt hospital nurse that she received orders from the hospitalist to schedule pt in 2-3 days for f/u with Dr. Jana Hakim d/t pt now being d/c after 2 wks of hospitalization. Scheduled f/u with Dr. Jana Hakim on 02/27/15 at 5:00PM. Pt nurse confirmed appt with pt.

## 2015-02-24 NOTE — Clinical Documentation Improvement (Signed)
Pt admitted with possible UTI and sepsis.  3/13  note states pancytopenia due to antineoplastic chemo and hypotension / tachycardia due to dehydration on admission.  Please clarify if, after study, sepsis was ruled in or out and document in your progress note and carry over to the discharge summary.  Possible Clinical Conditions: -Sepsis ruled out -Sepsis ruled in / present on admission -Unable to determine at present  Thank you, Mateo Flow, RN [phone] Clinical Documentation Specialist

## 2015-02-25 ENCOUNTER — Ambulatory Visit: Payer: PRIVATE HEALTH INSURANCE

## 2015-02-25 NOTE — Progress Notes (Signed)
Repeat UA reviewed- infection resolving.

## 2015-02-27 ENCOUNTER — Other Ambulatory Visit (HOSPITAL_BASED_OUTPATIENT_CLINIC_OR_DEPARTMENT_OTHER): Payer: No Typology Code available for payment source

## 2015-02-27 ENCOUNTER — Ambulatory Visit (HOSPITAL_BASED_OUTPATIENT_CLINIC_OR_DEPARTMENT_OTHER): Payer: No Typology Code available for payment source | Admitting: Oncology

## 2015-02-27 ENCOUNTER — Ambulatory Visit (HOSPITAL_BASED_OUTPATIENT_CLINIC_OR_DEPARTMENT_OTHER): Payer: Self-pay | Admitting: Pharmacist

## 2015-02-27 ENCOUNTER — Telehealth: Payer: Self-pay | Admitting: Oncology

## 2015-02-27 ENCOUNTER — Ambulatory Visit: Payer: PRIVATE HEALTH INSURANCE | Admitting: Oncology

## 2015-02-27 VITALS — BP 96/70 | HR 100 | Temp 97.7°F | Resp 18 | Ht 65.5 in | Wt 176.9 lb

## 2015-02-27 DIAGNOSIS — I823 Embolism and thrombosis of renal vein: Secondary | ICD-10-CM | POA: Diagnosis not present

## 2015-02-27 DIAGNOSIS — Z7901 Long term (current) use of anticoagulants: Secondary | ICD-10-CM

## 2015-02-27 DIAGNOSIS — I82409 Acute embolism and thrombosis of unspecified deep veins of unspecified lower extremity: Secondary | ICD-10-CM

## 2015-02-27 DIAGNOSIS — C50212 Malignant neoplasm of upper-inner quadrant of left female breast: Secondary | ICD-10-CM | POA: Diagnosis not present

## 2015-02-27 DIAGNOSIS — Z171 Estrogen receptor negative status [ER-]: Secondary | ICD-10-CM | POA: Diagnosis not present

## 2015-02-27 DIAGNOSIS — N390 Urinary tract infection, site not specified: Secondary | ICD-10-CM

## 2015-02-27 DIAGNOSIS — D61810 Antineoplastic chemotherapy induced pancytopenia: Secondary | ICD-10-CM

## 2015-02-27 DIAGNOSIS — D6481 Anemia due to antineoplastic chemotherapy: Secondary | ICD-10-CM

## 2015-02-27 DIAGNOSIS — T451X5A Adverse effect of antineoplastic and immunosuppressive drugs, initial encounter: Secondary | ICD-10-CM

## 2015-02-27 DIAGNOSIS — D630 Anemia in neoplastic disease: Secondary | ICD-10-CM

## 2015-02-27 DIAGNOSIS — C773 Secondary and unspecified malignant neoplasm of axilla and upper limb lymph nodes: Secondary | ICD-10-CM

## 2015-02-27 DIAGNOSIS — C50912 Malignant neoplasm of unspecified site of left female breast: Secondary | ICD-10-CM

## 2015-02-27 LAB — PROTIME-INR
INR: 1.5 — AB (ref 2.00–3.50)
PROTIME: 18 s — AB (ref 10.6–13.4)

## 2015-02-27 LAB — COMPREHENSIVE METABOLIC PANEL (CC13)
ALK PHOS: 119 U/L (ref 40–150)
ALT: 19 U/L (ref 0–55)
AST: 26 U/L (ref 5–34)
Albumin: 2.9 g/dL — ABNORMAL LOW (ref 3.5–5.0)
Anion Gap: 9 mEq/L (ref 3–11)
BILIRUBIN TOTAL: 0.36 mg/dL (ref 0.20–1.20)
BUN: 15.3 mg/dL (ref 7.0–26.0)
CO2: 28 mEq/L (ref 22–29)
CREATININE: 0.7 mg/dL (ref 0.6–1.1)
Calcium: 9.6 mg/dL (ref 8.4–10.4)
Chloride: 100 mEq/L (ref 98–109)
EGFR: >90 mL/min/{1.73_m2} (ref >90)
Glucose: 78 mg/dl (ref 70–140)
POTASSIUM: 3.6 meq/L (ref 3.5–5.1)
SODIUM: 137 meq/L (ref 136–145)
Total Protein: 7.7 g/dL (ref 6.4–8.3)

## 2015-02-27 LAB — CBC WITH DIFFERENTIAL/PLATELET
BASO%: 0.5 % (ref 0.0–2.0)
BASOS ABS: 0 10*3/uL (ref 0.0–0.1)
EOS ABS: 0.2 10*3/uL (ref 0.0–0.5)
EOS%: 8.8 % — ABNORMAL HIGH (ref 0.0–7.0)
HCT: 30.9 % — ABNORMAL LOW (ref 34.8–46.6)
HGB: 9.8 g/dL — ABNORMAL LOW (ref 11.6–15.9)
LYMPH%: 52.2 % — AB (ref 14.0–49.7)
MCH: 27.8 pg (ref 25.1–34.0)
MCHC: 31.7 g/dL (ref 31.5–36.0)
MCV: 87.5 fL (ref 79.5–101.0)
MONO#: 0.4 10*3/uL (ref 0.1–0.9)
MONO%: 22.5 % — ABNORMAL HIGH (ref 0.0–14.0)
NEUT%: 16 % — ABNORMAL LOW (ref 38.4–76.8)
NEUTROS ABS: 0.3 10*3/uL — AB (ref 1.5–6.5)
Platelets: 296 10*3/uL (ref 145–400)
RBC: 3.53 10*6/uL — ABNORMAL LOW (ref 3.70–5.45)
RDW: 21.6 % — ABNORMAL HIGH (ref 11.2–14.5)
WBC: 1.8 10*3/uL — ABNORMAL LOW (ref 3.9–10.3)
lymph#: 1 10*3/uL (ref 0.9–3.3)

## 2015-02-27 LAB — POCT INR: INR: 1.5

## 2015-02-27 NOTE — Telephone Encounter (Signed)
Appointments made and avs printed for patient °

## 2015-02-27 NOTE — Progress Notes (Signed)
INR = 1.5    Goal 2-3 INR below goal range. Patient was hospitalized 3/11-3/14 with hematuria/UTI. Hematuria has resolved. Coumadin was held during hospital stay except for a 1 mg dose on 3/14. She states she was told at discharge to hold Coumadin until seen by the Coumadin clinic. She is currently taking Vantin and has 4 more days before completing therapy. Bennie Pierini is considered a moderate interaction with Coumadin and may increase INR. I called and spoke with Dr. Jana Hakim, we will not bridge her with Lovenox at present. She will take Coumadin 7.5 mg today, then resume Coumadin 5 mg daily. We will recheck INR early next week on 3/22 at 8:30am for lab, 9am for Dr. Jana Hakim and 10:00am for treatment. We will see her in the treatment area.  Samples given:  Coumadin 5 mg #30   Lot:  [id number]    Exp:  04/11/16  Theone Murdoch, PharmD

## 2015-02-27 NOTE — Progress Notes (Signed)
ID: Erika [name] OB: 16-Oct-1971  MR#: [id number]  CSN#:[id number]  PCP: [name], MD GYN:   SU: Dr. [name] OTHER MD:  CHIEF COMPLAINT:  metastatic breast cancer  CURRENT TREATMENT: CMF,  Coumadin  BREAST CANCER HISTORY: From Dr. [name] original intake note:  "Patient developed a left sided breast mass show up about 6 months prior to diagnosis. 2 months prior this began getting larger rapidly. She was seen in an urgent care where this mass was thought to be an abscess. Apparently there was an attempt to excise or drain this that was not successful and she was referred to Dr. [name] for evaluation. She underwent punch biopsy on 04/23/13 of her skin and the mass. She was then also sent for mm/us. The biopsy shows invasive high grade carcinoma that appears to be breast. It is ER 14%, PR negative, HER-2/neu negative with a Ki-67 that approaches 100%. She has also undergone mm with at least 10 cm upper inner left breast tumor with mild overyling skin thickening. There is also a 3x10 mm cluster of calcs near there also. There is indeterminate 7 mm level 1 left axillary node, she underwent a lymph node biopsy that was positive for disease. No mm evidence of right breast malignancy. An MRI of the breasts was done on 05/22/13 and showed a 14 cm mass in the left breast protruding from the skin and also a 2.5 cm level II axillary node."  Her subsequent history is as detailed below  INTERVAL HISTORY:   Chestina returns today for follow up of her breast cancer. She is currently day 17 cycle 5 of CMF. Since her last visit here she was admitted to the hospital presenting with hematuria and evaluated for sepsis. I do not find urine or blood cultures. She was treated with intravenous antibiotics and continues on Vantin, with 4 doses left. A hematuria in the meantime has resolved.  REVIEW OF SYSTEMS:  Amilah states she is "feeling fine". Her pain is controlled at "about a 5" on an 100 mcg/h  of Duragesic with oxycodone for breakthrough. Bowel movements are fine. She denies constipation or melena. She denies fever. She feels forgetful and anxious, but not depressed. A detailed review of systems today was otherwise noncontributory  PAST MEDICAL HISTORY: Past Medical History  Diagnosis Date  . Breast cancer     Invasive High Grade Carcinoma  . Headache(784.0)     Hx: of Migraines  . Status post chemotherapy     Adriamycin and Cytoxan  . Anxiety     ANXIOUS ABOUT HAVEING BIG SURGERY - MASTECTOMY  . 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 DVT (deep venous thrombosis)     RT ARM-PT ON LOVENOX  . Hx of iron deficiency anemia 11/26/13  . S/P radiation therapy 02-04-14 to 03-18-14                               1) Left Chest Wall / 50 Gy in 25 fractions/ 2) Left Supraclavicular fossa/ 46 Gy in 23 fractions / 3) Left Posterior Axillary boost / 8.533 Gy in 23 fractions/ 4) Left Chest Wall Scar boost / 10 Gy in 5 fractions    PAST SURGICAL HISTORY: Past Surgical History  Procedure Laterality Date  . Tubal ligation    . Portacath placement Right 05/22/2013    Procedure: INSERTION PORT-A-CATH;  Surgeon: [name], MD;  Location: [place];  Service: General;  Laterality: Right;  . Left breast needle core biopsy Left 05/21/13  . Left breast needle core  biopsy Left 06/22/13  . Mastectomy modified radical Left 11/15/2013    Procedure: LEFT MASTECTOMY MODIFIED RADICAL;  Surgeon: [name], MD;  Location: [facility];  Service: General;  Laterality: Left;    FAMILY HISTORY Family History  Problem Relation Age of Onset  . Hypertension Mother   . Diabetes Father 16  . Stomach cancer Maternal Aunt   . Hypertension Maternal Grandmother   . Hypertension Maternal Grandfather   . Diabetes Paternal Grandmother   . Diabetes Paternal Grandfather   . Diabetes Paternal Uncle   . Diabetes Paternal Uncle   . Diabetes Paternal Uncle   The patient's mother is alive, age 6. The patient's  father died from complications of diabetes at the age of 42. The patient has 2 brothers, one sister. There is no history of breast or ovarian cancer in the family to her knowledge .the patient has not undergone genetic testing   GYNECOLOGIC HISTORY:  Menarche age 57, first live birth age 57. The patient is GX P3. The patient has been on goserelin since 06/13/2013, given monthly  SOCIAL HISTORY:  The patient has worked at as a Furniture conservator/restorer and a Retail buyer. She is currently applying for disability. At home she lives with her youngest son, [name], 35; with the patient's brother Michayla Mcneil; and with the patient's fiance [name].   ADVANCED DIRECTIVES: Not in place  HEALTH MAINTENANCE: History  Substance Use Topics  . Smoking status: Never Smoker   . Smokeless tobacco: Never Used  . Alcohol Use: No     Mammogram: 06/22/2013 Colonoscopy: Bone Density Scan:  Pap Smear:  Eye Exam:  Vitamin D Level:   Lipid Panel:    Allergies  Allergen Reactions  . Doxycycline Nausea And Vomiting    Current Outpatient Prescriptions  Medication Sig Dispense Refill  . cefpodoxime (VANTIN) 200 MG tablet Take 1 tablet (200 mg total) by mouth every 12 (twelve) hours. 5 tablet 0  . cyclobenzaprine (FLEXERIL) 5 MG tablet TAKE 1 TABLET BY MOUTH THREE TIMES A DAY FOR MUSCLE SPASMS 30 tablet 1  . fentaNYL (DURAGESIC - DOSED MCG/HR) 25 MCG/HR patch Place 1 patch (25 mcg total) onto the skin every 3 (three) days. 10 patch 0  . fentaNYL (DURAGESIC - DOSED MCG/HR) 75 MCG/HR Place 1 patch (75 mcg total) onto the skin every 3 (three) days. 10 patch 0  . gabapentin (NEURONTIN) 300 MG capsule Take 1 capsule (300 mg total) by mouth 3 (three) times daily. 90 capsule 4  . loratadine (CLARITIN) 10 MG tablet Take 10 mg by mouth daily as needed for allergies.    Marland Kitchen LORazepam (ATIVAN) 0.5 MG tablet Take 1 tablet (0.5 mg total) by mouth every 6 (six) hours as needed for anxiety. 30 tablet 0  . ondansetron (ZOFRAN) 8 MG  tablet Take by mouth every 8 (eight) hours as needed for nausea or vomiting.    Marland Kitchen oxyCODONE (OXY IR/ROXICODONE) 5 MG immediate release tablet Take 1 tablet (5 mg total) by mouth every 8 (eight) hours as needed for severe pain or breakthrough pain. 90 tablet 0  . potassium chloride SA (K-DUR,KLOR-CON) 20 MEQ tablet Take 1 tablet (20 mEq total) by mouth 4 (four) times daily. 120 tablet 1  . traMADol (ULTRAM) 50 MG tablet Take 1-2 tablets (50-100 mg total) by mouth every 6 (six) hours as needed. 30 tablet 1  . UNABLE TO FIND Rx:  L8000- Post Surgical Bra (Quantity: 6) U5427- Non Silicone Breast prosthesis (Quantity: 1) Dx: 174.9; Left mastectomy 1 each 0  . warfarin (COUMADIN) 5 MG tablet Take 2.5-5 mg by mouth daily. Patient is getting regulated on Coumadin.  Take 2.5 mg on 02/21/15 then take 5 mg on Saturday and Sunday then will go back to doctor.    . zolpidem (AMBIEN) 5 MG tablet Take 1 tablet (5 mg total) by mouth at bedtime as needed for sleep. 30 tablet 0   No current facility-administered medications for this visit.   Facility-Administered Medications Ordered in Other Visits  Medication Dose Route Frequency Provider Last Rate Last Dose  . topical emolient (BIAFINE) emulsion   Topical Daily [name], MD        OBJECTIVE: middle-aged African American woman in no acute distress Filed Vitals:   02/27/15 1442  BP: 96/70  Pulse: 100  Temp: 97.7 F (36.5 C)  Resp: 18     Body mass index is 28.98 kg/(m^2).      ECOG FS:1 - Symptomatic but completely ambulatory   Sclerae unicteric, EOMs intact Oropharynx clear and moist-- no thrush or other lesions No cervical or supraclavicular adenopathy Lungs no rales or rhonchi Heart regular rate and rhythm Abd soft, nontender, positive bowel sounds MSK no focal spinal tenderness, no upper extremity lymphedema Neuro: nonfocal, well oriented, appropriate affect Breasts: The right breast is unremarkable. The left breast is status post mastectomy  and radiation. There is no evidence of chest wall recurrence. Left axilla is benign.     LAB RESULTS:  CMP     Component Value Date/Time   NA 137 02/27/2015 1331   NA 137 02/24/2015 0624   K 3.6 02/27/2015 1331   K 4.1 02/24/2015 0624   CL 103 02/24/2015 0624   CL 102 06/01/2013 1500   CO2 28 02/27/2015 1331   CO2 26 02/24/2015 0624   GLUCOSE 78 02/27/2015 1331   GLUCOSE 94 02/24/2015 0624   GLUCOSE 95 06/01/2013 1500   BUN 15.3 02/27/2015 1331   BUN 7 02/24/2015 0624   CREATININE 0.7 02/27/2015 1331   CREATININE 0.67 02/24/2015 0624   CALCIUM 9.6 02/27/2015 1331   CALCIUM 9.2 02/24/2015 0624   PROT 7.7 02/27/2015 1331   PROT 8.1 02/21/2015 2100   ALBUMIN 2.9* 02/27/2015 1331   ALBUMIN 3.2* 02/21/2015 2100   AST 26 02/27/2015 1331   AST 31 02/21/2015 2100   ALT 19 02/27/2015 1331   ALT 20 02/21/2015 2100   ALKPHOS 119 02/27/2015 1331   ALKPHOS 123* 02/21/2015 2100   BILITOT 0.36 02/27/2015 1331   BILITOT 0.6 02/21/2015 2100   GFRNONAA >90 02/24/2015 0624   GFRAA >90 02/24/2015 0624    I No results found for: SPEP  Lab Results  Component Value Date   WBC 1.8* 02/27/2015   NEUTROABS 0.3* 02/27/2015   HGB 9.8* 02/27/2015   HCT 30.9* 02/27/2015   MCV 87.5 02/27/2015   PLT 296 02/27/2015      Chemistry      Component Value Date/Time   NA 137 02/27/2015 1331   NA 137 02/24/2015 0624   K 3.6 02/27/2015 1331   K 4.1 02/24/2015 0624   CL 103 02/24/2015 0624   CL 102 06/01/2013 1500   CO2 28 02/27/2015 1331   CO2 26 02/24/2015 0624   BUN 15.3 02/27/2015 1331   BUN 7 02/24/2015 0624   CREATININE 0.7 02/27/2015 1331   CREATININE 0.67 02/24/2015 0623  Component Value Date/Time   CALCIUM 9.6 02/27/2015 1331   CALCIUM 9.2 02/24/2015 0624   ALKPHOS 119 02/27/2015 1331   ALKPHOS 123* 02/21/2015 2100   AST 26 02/27/2015 1331   AST 31 02/21/2015 2100   ALT 19 02/27/2015 1331   ALT 20 02/21/2015 2100   BILITOT 0.36 02/27/2015 1331   BILITOT 0.6  02/21/2015 2100       Lab Results  Component Value Date   LABCA2 35 05/18/2013    No components found for: LABCA125   Recent Labs Lab 02/27/15 1331  INR 1.50*    Urinalysis    Component Value Date/Time   COLORURINE YELLOW 02/24/2015 1632    STUDIES: No results found.  ASSESSMENT: 45 y.o.  BRCA negative [place] woman with  triple negative metastatic breast cancer.  (0) genetic testing of the ATM, BARD1, BRCA1, BRCA2, BRIP1, CDH1, CHEK2, EPCAM, FANCC, MLH1, MSH2, MSH6, NBN, PALB2, PMS2, PTEN, RAD51C, RAD51D, STK11, TP53, and XRCC2 genes showed no deleterious mutations  (1) status post left breast biopsy 04/23/2013 for a clinical T4 N1, stage IIIB invasive ductal carcinoma, grade 3,  estrogen receptor 14% positive with moderate staining intensity, progesterone receptor negative,  with an MIB-1 of  100% and HER-2/neu  nonamplified  (2) biopsy of a left axillary lymph node [phone] was positive  (3) started monthly goserelin July of 02/01/2013  (4)  received  neoadjuvant chemotherapy with doxorubicin and cyclophosphamide in dose dense fashion x4, followed by carboplatin and paclitaxel x1, discontinued due to neuropathy, followed by carboplatin and gemcitabine, both given day 1 of each 14 day cycle x6, completed 10/12/2013 .   (5) right upper extremity DVT documented 06/29/2013,  treated with Lovenox from  July  through  December 2014    (6) status post left modified radical mastectomy 11/15/2013 for a residual pT3 pN0 invasive ductal carcinoma, grade 3, with repeat prognostic panel triple negative. All 12 axillary lymph nodes were clear. Margins were negative   (5) completed adjuvant radiation therapy 03/18/2014,  with capecitabine added 2-3 weeks into treatment.  METASTATIC DISEASE  (6) CT scans of the chest, abdomen, and pelvis 06/26/2014 showed a large right pleural effusion  with pleural studding, 2 left upper lobe nodules, and two  liver lesions. Cytology from right  thoracentesis 07/03/2014 showed malignant cells consistent with the patient's known primary  (7) started eribulin 07/09/2014, given days 1 and 8 of each 21 day cycle; restagind studies after cycle 3 showed evidence of response. Discontinued after cycle 5 (last dose 10/08/14) because of worsening peripheral neuropathy--also with progression.   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 Antithrombin level was normal.  PLAN:  Tesneem has recovered from her urinary tract infection and we can resume treatment. The only difference is that I'm going to add Neulasta today to to prevent neutropenia as much as possible. The plan then will be for her CMF March 22 with Neulasta March 23. She will receive the same chemotherapy every 3 weeks until she sees me in May and before that May visit with me she will have a repeat CT scan of the chest to assess for  response.  We obtained a hypercoagulable panel to see if we could document an inherited thrombotic tendency but there is none we can detect. Likely the reason for her clot is her concurrent cancer. We resumed her Coumadin today without a bridge because of the recent bleeding.  Her pain is well-controlled on her current medications and she is not constipated from the narcotics.  Kieren has a good understanding of the overall plan. She agrees with it. She knows the goal of treatment in her case is control She knows to call for any problems that may develop before her next visit  here.    [name], [place] [phone] 02/27/2015 2:47 PM

## 2015-02-28 ENCOUNTER — Telehealth: Payer: Self-pay | Admitting: Oncology

## 2015-02-28 ENCOUNTER — Ambulatory Visit: Payer: PRIVATE HEALTH INSURANCE

## 2015-02-28 ENCOUNTER — Other Ambulatory Visit: Payer: Self-pay | Admitting: *Deleted

## 2015-02-28 NOTE — Telephone Encounter (Signed)
Confirm appointmetn for 03/23

## 2015-03-03 ENCOUNTER — Telehealth: Payer: Self-pay | Admitting: Oncology

## 2015-03-03 NOTE — Telephone Encounter (Signed)
per Melissa to add pt to CC-pt aware

## 2015-03-04 ENCOUNTER — Other Ambulatory Visit (HOSPITAL_BASED_OUTPATIENT_CLINIC_OR_DEPARTMENT_OTHER): Payer: No Typology Code available for payment source

## 2015-03-04 ENCOUNTER — Telehealth: Payer: Self-pay | Admitting: Oncology

## 2015-03-04 ENCOUNTER — Ambulatory Visit (HOSPITAL_BASED_OUTPATIENT_CLINIC_OR_DEPARTMENT_OTHER): Payer: No Typology Code available for payment source | Admitting: Oncology

## 2015-03-04 ENCOUNTER — Ambulatory Visit (HOSPITAL_BASED_OUTPATIENT_CLINIC_OR_DEPARTMENT_OTHER): Payer: No Typology Code available for payment source

## 2015-03-04 ENCOUNTER — Other Ambulatory Visit: Payer: Self-pay | Admitting: *Deleted

## 2015-03-04 ENCOUNTER — Ambulatory Visit (HOSPITAL_BASED_OUTPATIENT_CLINIC_OR_DEPARTMENT_OTHER): Payer: Self-pay | Admitting: Pharmacist

## 2015-03-04 VITALS — BP 102/67 | HR 102 | Temp 97.6°F | Resp 18 | Ht 65.5 in | Wt 178.3 lb

## 2015-03-04 DIAGNOSIS — C50212 Malignant neoplasm of upper-inner quadrant of left female breast: Secondary | ICD-10-CM

## 2015-03-04 DIAGNOSIS — I82409 Acute embolism and thrombosis of unspecified deep veins of unspecified lower extremity: Secondary | ICD-10-CM

## 2015-03-04 DIAGNOSIS — Z86718 Personal history of other venous thrombosis and embolism: Secondary | ICD-10-CM

## 2015-03-04 DIAGNOSIS — Z5111 Encounter for antineoplastic chemotherapy: Secondary | ICD-10-CM | POA: Diagnosis not present

## 2015-03-04 DIAGNOSIS — T451X5A Adverse effect of antineoplastic and immunosuppressive drugs, initial encounter: Secondary | ICD-10-CM

## 2015-03-04 DIAGNOSIS — C773 Secondary and unspecified malignant neoplasm of axilla and upper limb lymph nodes: Secondary | ICD-10-CM

## 2015-03-04 DIAGNOSIS — Z171 Estrogen receptor negative status [ER-]: Secondary | ICD-10-CM

## 2015-03-04 DIAGNOSIS — D630 Anemia in neoplastic disease: Secondary | ICD-10-CM

## 2015-03-04 DIAGNOSIS — D6481 Anemia due to antineoplastic chemotherapy: Secondary | ICD-10-CM

## 2015-03-04 DIAGNOSIS — I823 Embolism and thrombosis of renal vein: Secondary | ICD-10-CM

## 2015-03-04 DIAGNOSIS — G893 Neoplasm related pain (acute) (chronic): Secondary | ICD-10-CM

## 2015-03-04 DIAGNOSIS — C50912 Malignant neoplasm of unspecified site of left female breast: Secondary | ICD-10-CM

## 2015-03-04 DIAGNOSIS — E876 Hypokalemia: Secondary | ICD-10-CM

## 2015-03-04 LAB — CBC WITH DIFFERENTIAL/PLATELET
BASO%: 0.2 % (ref 0.0–2.0)
BASOS ABS: 0 10*3/uL (ref 0.0–0.1)
EOS ABS: 0.1 10*3/uL (ref 0.0–0.5)
EOS%: 1.8 % (ref 0.0–7.0)
HCT: 31.8 % — ABNORMAL LOW (ref 34.8–46.6)
HGB: 10.1 g/dL — ABNORMAL LOW (ref 11.6–15.9)
LYMPH%: 19.7 % (ref 14.0–49.7)
MCH: 27.7 pg (ref 25.1–34.0)
MCHC: 31.8 g/dL (ref 31.5–36.0)
MCV: 87.4 fL (ref 79.5–101.0)
MONO#: 0.9 10*3/uL (ref 0.1–0.9)
MONO%: 13.1 % (ref 0.0–14.0)
NEUT#: 4.3 10*3/uL (ref 1.5–6.5)
NEUT%: 65.2 % (ref 38.4–76.8)
Platelets: 456 10*3/uL — ABNORMAL HIGH (ref 145–400)
RBC: 3.64 10*6/uL — AB (ref 3.70–5.45)
RDW: 21.7 % — ABNORMAL HIGH (ref 11.2–14.5)
WBC: 6.6 10*3/uL (ref 3.9–10.3)
lymph#: 1.3 10*3/uL (ref 0.9–3.3)

## 2015-03-04 LAB — POCT INR: INR: 2

## 2015-03-04 LAB — COMPREHENSIVE METABOLIC PANEL (CC13)
ALT: 19 U/L (ref 0–55)
ANION GAP: 10 meq/L (ref 3–11)
AST: 30 U/L (ref 5–34)
Albumin: 2.7 g/dL — ABNORMAL LOW (ref 3.5–5.0)
Alkaline Phosphatase: 132 U/L (ref 40–150)
BUN: 9.2 mg/dL (ref 7.0–26.0)
CALCIUM: 9.7 mg/dL (ref 8.4–10.4)
CO2: 26 mEq/L (ref 22–29)
CREATININE: 0.7 mg/dL (ref 0.6–1.1)
Chloride: 105 mEq/L (ref 98–109)
EGFR: >90 mL/min/{1.73_m2} (ref >90)
Glucose: 78 mg/dl (ref 70–140)
Potassium: 3.9 mEq/L (ref 3.5–5.1)
Sodium: 141 mEq/L (ref 136–145)
TOTAL PROTEIN: 7.7 g/dL (ref 6.4–8.3)
Total Bilirubin: 0.25 mg/dL (ref 0.20–1.20)

## 2015-03-04 LAB — PROTIME-INR
INR: 2 (ref 2.00–3.50)
PROTIME: 24 s — AB (ref 10.6–13.4)

## 2015-03-04 MED ORDER — SODIUM CHLORIDE 0.9 % IV SOLN
600.0000 mg/m2 | Freq: Once | INTRAVENOUS | Status: AC
  Administered 2015-03-04: 1180 mg via INTRAVENOUS
  Filled 2015-03-04: qty 59

## 2015-03-04 MED ORDER — METHOTREXATE SODIUM CHEMO INJECTION 25 MG/ML
40.0000 mg/m2 | Freq: Once | INTRAMUSCULAR | Status: AC
  Administered 2015-03-04: 80 mg via INTRAVENOUS
  Filled 2015-03-04: qty 3.2

## 2015-03-04 MED ORDER — FLUOROURACIL CHEMO INJECTION 2.5 GM/50ML
600.0000 mg/m2 | Freq: Once | INTRAVENOUS | Status: AC
  Administered 2015-03-04: 1200 mg via INTRAVENOUS
  Filled 2015-03-04: qty 24

## 2015-03-04 MED ORDER — SODIUM CHLORIDE 0.9 % IJ SOLN
10.0000 mL | INTRAMUSCULAR | Status: DC | PRN
  Administered 2015-03-04: 10 mL
  Filled 2015-03-04: qty 10

## 2015-03-04 MED ORDER — SODIUM CHLORIDE 0.9 % IV SOLN
Freq: Once | INTRAVENOUS | Status: AC
  Administered 2015-03-04: 10:00:00 via INTRAVENOUS

## 2015-03-04 MED ORDER — HEPARIN SOD (PORK) LOCK FLUSH 100 UNIT/ML IV SOLN
500.0000 [IU] | Freq: Once | INTRAVENOUS | Status: AC | PRN
  Administered 2015-03-04: 500 [IU]
  Filled 2015-03-04: qty 5

## 2015-03-04 MED ORDER — DEXAMETHASONE SODIUM PHOSPHATE 100 MG/10ML IJ SOLN
Freq: Once | INTRAMUSCULAR | Status: AC
  Administered 2015-03-04: 10:00:00 via INTRAVENOUS
  Filled 2015-03-04: qty 4

## 2015-03-04 NOTE — Progress Notes (Signed)
ID: Erika [name] OB: 07-06-1971  MR#: [id number]  CSN#:[id number]  PCP: [name], MD GYN:   SU: Dr. [name] OTHER MD:  CHIEF COMPLAINT:  metastatic breast cancer  CURRENT TREATMENT: CMF,  Coumadin  BREAST CANCER HISTORY: From Dr. [name] original intake note:  "Patient developed a left sided breast mass show up about 6 months prior to diagnosis. 2 months prior this began getting larger rapidly. She was seen in an urgent care where this mass was thought to be an abscess. Apparently there was an attempt to excise or drain this that was not successful and she was referred to Dr. [name] for evaluation. She underwent punch biopsy on 04/23/13 of her skin and the mass. She was then also sent for mm/us. The biopsy shows invasive high grade carcinoma that appears to be breast. It is ER 14%, PR negative, HER-2/neu negative with a Ki-67 that approaches 100%. She has also undergone mm with at least 10 cm upper inner left breast tumor with mild overyling skin thickening. There is also a 3x10 mm cluster of calcs near there also. There is indeterminate 7 mm level 1 left axillary node, she underwent a lymph node biopsy that was positive for disease. No mm evidence of right breast malignancy. An MRI of the breasts was done on 05/22/13 and showed a 14 cm mass in the left breast protruding from the skin and also a 2.5 cm level II axillary node."  Her subsequent history is as detailed below  INTERVAL HISTORY:   Erika [name] returns today for follow up of her breast cancer. Today is day 1 cycle 6 of Cytoxan, methotrexate, fluorouracil, which she receives every 21 days. We are adding onpro to her treatment to avoid further problems with infection as she had recently.  REVIEW OF SYSTEMS:  Erika [name] has completed her antibiotics and currently is afebrile. She feels she is "back to baseline". She feels she did much better after her blood transfusion. When she receives CMF she feels tired for a few days then  recovers. She denies unusual headaches, visual changes, nausea, or vomiting problems. She has had no bleeding on the Coumadin. She does bruise easily. He has been no change in bowel or bladder habits. She feels a little forgetful and anxious but not depressed. She continues to hurt "everywhere" and describes the pain as stabbing throbbing and aching. She continues on Duragesic for this with oxycodone for breakthrough. There has been no significant increase in her dose for some time. She is not constipated from the narcotics. A detailed review of systems today was otherwise noncontributory  PAST MEDICAL HISTORY: Past Medical History  Diagnosis Date  . Breast cancer     Invasive High Grade Carcinoma  . Headache(784.0)     Hx: of Migraines  . Status post chemotherapy     Adriamycin and Cytoxan  . Anxiety     ANXIOUS ABOUT HAVEING BIG SURGERY - MASTECTOMY  . 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 DVT (deep venous thrombosis)     RT ARM-PT ON LOVENOX  . Hx of iron deficiency anemia 11/26/13  . S/P radiation therapy 02-04-14 to 03-18-14                               1) Left Chest Wall / 50 Gy in 25 fractions/ 2) Left Supraclavicular fossa/ 46 Gy in 23 fractions / 3) Left Posterior Axillary boost / 8.533 Gy in  23 fractions/ 4) Left Chest Wall Scar boost / 10 Gy in 5 fractions    PAST SURGICAL HISTORY: Past Surgical History  Procedure Laterality Date  . Tubal ligation    . Portacath placement Right 05/22/2013    Procedure: INSERTION PORT-A-CATH;  Surgeon: [name], MD;  Location: [facility];  Service: General;  Laterality: Right;  . Left breast needle core biopsy Left 05/21/13  . Left breast needle core  biopsy Left 06/22/13  . Mastectomy modified radical Left 11/15/2013    Procedure: LEFT MASTECTOMY MODIFIED RADICAL;  Surgeon: [name], MD;  Location: [facility];  Service: General;  Laterality: Left;    FAMILY HISTORY Family History  Problem Relation Age of Onset  . Hypertension  Mother   . Diabetes Father 13  . Stomach cancer Maternal Aunt   . Hypertension Maternal Grandmother   . Hypertension Maternal Grandfather   . Diabetes Paternal Grandmother   . Diabetes Paternal Grandfather   . Diabetes Paternal Uncle   . Diabetes Paternal Uncle   . Diabetes Paternal Uncle   The patient's mother is alive, age 28. The patient's father died from complications of diabetes at the age of 7. The patient has 2 brothers, one sister. There is no history of breast or ovarian cancer in the family to her knowledge .the patient has not undergone genetic testing   GYNECOLOGIC HISTORY:  Menarche age 54, first live birth age 35. The patient is GX P3. The patient has been on goserelin since 06/13/2013, given monthly  SOCIAL HISTORY:  The patient has worked at as a Furniture conservator/restorer and a Retail buyer. She is currently applying for disability. At home she lives with her youngest son, [name], 77; with the patient's brother Mindi Akerson; and with the patient's fiance [name].   ADVANCED DIRECTIVES: Not in place  HEALTH MAINTENANCE: History  Substance Use Topics  . Smoking status: Never Smoker   . Smokeless tobacco: Never Used  . Alcohol Use: No     Mammogram: 06/22/2013 Colonoscopy: Bone Density Scan:  Pap Smear:  Eye Exam:  Vitamin D Level:   Lipid Panel:    Allergies  Allergen Reactions  . Doxycycline Nausea And Vomiting    Current Outpatient Prescriptions  Medication Sig Dispense Refill  . cyclobenzaprine (FLEXERIL) 5 MG tablet TAKE 1 TABLET BY MOUTH THREE TIMES A DAY FOR MUSCLE SPASMS 30 tablet 1  . fentaNYL (DURAGESIC - DOSED MCG/HR) 25 MCG/HR patch Place 1 patch (25 mcg total) onto the skin every 3 (three) days. 10 patch 0  . fentaNYL (DURAGESIC - DOSED MCG/HR) 75 MCG/HR Place 1 patch (75 mcg total) onto the skin every 3 (three) days. 10 patch 0  . gabapentin (NEURONTIN) 300 MG capsule Take 1 capsule (300 mg total) by mouth 3 (three) times daily. 90 capsule 4  .  loratadine (CLARITIN) 10 MG tablet Take 10 mg by mouth daily as needed for allergies.    Marland Kitchen LORazepam (ATIVAN) 0.5 MG tablet Take 1 tablet (0.5 mg total) by mouth every 6 (six) hours as needed for anxiety. 30 tablet 0  . ondansetron (ZOFRAN) 8 MG tablet Take by mouth every 8 (eight) hours as needed for nausea or vomiting.    Marland Kitchen oxyCODONE (OXY IR/ROXICODONE) 5 MG immediate release tablet Take 1 tablet (5 mg total) by mouth every 8 (eight) hours as needed for severe pain or breakthrough pain. 90 tablet 0  . potassium chloride SA (K-DUR,KLOR-CON) 20 MEQ tablet Take 1 tablet (20 mEq total) by mouth 4 (  four) times daily. 120 tablet 1  . traMADol (ULTRAM) 50 MG tablet Take 1-2 tablets (50-100 mg total) by mouth every 6 (six) hours as needed. 30 tablet 1  . warfarin (COUMADIN) 5 MG tablet Take 2.5-5 mg by mouth daily. Patient is getting regulated on Coumadin.  Take 2.5 mg on 02/21/15 then take 5 mg on Saturday and Sunday then will go back to doctor.    . zolpidem (AMBIEN) 5 MG tablet Take 1 tablet (5 mg total) by mouth at bedtime as needed for sleep. 30 tablet 0   No current facility-administered medications for this visit.   Facility-Administered Medications Ordered in Other Visits  Medication Dose Route Frequency Provider Last Rate Last Dose  . topical emolient (BIAFINE) emulsion   Topical Daily [name], MD        OBJECTIVE: middle-aged African American woman who appears stated age 44 Vitals:   03/04/15 0856  BP: 102/67  Pulse: 102  Temp: 97.6 F (36.4 C)  Resp: 18     Body mass index is 29.21 kg/(m^2).      ECOG FS:1 - Symptomatic but completely ambulatory   Sclerae unicteric, pupils round and equal Oropharynx clear, teeth in fair repair, bridge in place No cervical or supraclavicular adenopathy Lungs no rales or rhonchi Heart regular rate and rhythm Abd soft, nontender, positive bowel sounds MSK no focal spinal tenderness, no upper extremity lymphedema Neuro: nonfocal, well  oriented, appropriate affect Breasts: Deferred    LAB RESULTS:  CMP     Component Value Date/Time   NA 137 02/27/2015 1331   NA 137 02/24/2015 0624   K 3.6 02/27/2015 1331   K 4.1 02/24/2015 0624   CL 103 02/24/2015 0624   CL 102 06/01/2013 1500   CO2 28 02/27/2015 1331   CO2 26 02/24/2015 0624   GLUCOSE 78 02/27/2015 1331   GLUCOSE 94 02/24/2015 0624   GLUCOSE 95 06/01/2013 1500   BUN 15.3 02/27/2015 1331   BUN 7 02/24/2015 0624   CREATININE 0.7 02/27/2015 1331   CREATININE 0.67 02/24/2015 0624   CALCIUM 9.6 02/27/2015 1331   CALCIUM 9.2 02/24/2015 0624   PROT 7.7 02/27/2015 1331   PROT 8.1 02/21/2015 2100   ALBUMIN 2.9* 02/27/2015 1331   ALBUMIN 3.2* 02/21/2015 2100   AST 26 02/27/2015 1331   AST 31 02/21/2015 2100   ALT 19 02/27/2015 1331   ALT 20 02/21/2015 2100   ALKPHOS 119 02/27/2015 1331   ALKPHOS 123* 02/21/2015 2100   BILITOT 0.36 02/27/2015 1331   BILITOT 0.6 02/21/2015 2100   GFRNONAA >90 02/24/2015 0624   GFRAA >90 02/24/2015 0624    I No results found for: SPEP  Lab Results  Component Value Date   WBC 6.6 03/04/2015   NEUTROABS 4.3 03/04/2015   HGB 10.1* 03/04/2015   HCT 31.8* 03/04/2015   MCV 87.4 03/04/2015   PLT 456* 03/04/2015      Chemistry      Component Value Date/Time   NA 137 02/27/2015 1331   NA 137 02/24/2015 0624   K 3.6 02/27/2015 1331   K 4.1 02/24/2015 0624   CL 103 02/24/2015 0624   CL 102 06/01/2013 1500   CO2 28 02/27/2015 1331   CO2 26 02/24/2015 0624   BUN 15.3 02/27/2015 1331   BUN 7 02/24/2015 0624   CREATININE 0.7 02/27/2015 1331   CREATININE 0.67 02/24/2015 0624      Component Value Date/Time   CALCIUM 9.6 02/27/2015 1331   CALCIUM 9.2  02/24/2015 0624   ALKPHOS 119 02/27/2015 1331   ALKPHOS 123* 02/21/2015 2100   AST 26 02/27/2015 1331   AST 31 02/21/2015 2100   ALT 19 02/27/2015 1331   ALT 20 02/21/2015 2100   BILITOT 0.36 02/27/2015 1331   BILITOT 0.6 02/21/2015 2100       Lab Results   Component Value Date   LABCA2 35 05/18/2013    No components found for: LABCA125   Recent Labs Lab 03/04/15 0842  INR 2.00    Urinalysis    Component Value Date/Time   COLORURINE YELLOW 02/24/2015 1632    STUDIES: No results found.  ASSESSMENT: 44 y.o.  BRCA negative [place] woman with  triple negative metastatic breast cancer.  (0) genetic testing of the ATM, BARD1, BRCA1, BRCA2, BRIP1, CDH1, CHEK2, EPCAM, FANCC, MLH1, MSH2, MSH6, NBN, PALB2, PMS2, PTEN, RAD51C, RAD51D, STK11, TP53, and XRCC2 genes showed no deleterious mutations  (1) status post left breast biopsy 04/23/2013 for a clinical T4 N1, stage IIIB invasive ductal carcinoma, grade 3,  estrogen receptor 14% positive with moderate staining intensity, progesterone receptor negative,  with an MIB-1 of  100% and HER-2/neu  nonamplified  (2) biopsy of a left axillary lymph node [phone] was positive  (3) started monthly goserelin July of 02/01/2013  (4)  received  neoadjuvant chemotherapy with doxorubicin and cyclophosphamide in dose dense fashion x4, followed by carboplatin and paclitaxel x1, discontinued due to neuropathy, followed by carboplatin and gemcitabine, both given day 1 of each 14 day cycle x6, completed 10/12/2013 .   (5) right upper extremity DVT documented 06/29/2013,  treated with Lovenox from  July  through  December 2014    (6) status post left modified radical mastectomy 11/15/2013 for a residual pT3 pN0 invasive ductal carcinoma, grade 3, with repeat prognostic panel triple negative. All 12 axillary lymph nodes were clear. Margins were negative   (5) completed adjuvant radiation therapy 03/18/2014,  with capecitabine added 2-3 weeks into treatment.  METASTATIC DISEASE  (6) CT scans of the chest, abdomen, and pelvis 06/26/2014 showed a large right pleural effusion  with pleural studding, 2 left upper lobe nodules, and two  liver lesions. Cytology from right thoracentesis 07/03/2014 showed malignant  cells consistent with the patient's known primary  (7) started eribulin 07/09/2014, given days 1 and 8 of each 21 day cycle; restagind studies after cycle 3 showed evidence of response. Discontinued after cycle 5 (last dose 10/08/14) because of worsening peripheral neuropathy--also with progression.   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 Antithrombin level was normal.  PLAN:  Erika [name] is doing much better and is now ready to resume CMF treatments. We are going to add Neulasta in the form of onpro to see if we can avoid further infectious complications. She is already scheduled for her next treatment on visit and also for restaging CT scan in May with a visit with me shortly thereafter. At that time we will decide whether to continue on CMF or switch to a different agent, possibly Doxil.  She has a good understanding of the overall plan. She agrees with it. She knows to  call for any problems that may develop before her next visit here.   [name], [place] [phone] 03/04/2015 9:19 AM

## 2015-03-04 NOTE — Telephone Encounter (Signed)
per Lattie Haw in [place] to sch CC-pt aware

## 2015-03-04 NOTE — Progress Notes (Signed)
Patient seen in infusion area during treatment.  INR = 2.0     Goal 2-3 INR within goal range. No complications of anticoagulation noted. Erika [name] is resuming chemotherapy today. INR has been supratherapeutic in the recent past while taking 5 mg per day. Last week, she was off Coumadin for several days while hospitalized with a UTI. Coumadin was resumed last Thursday with 7.5 mg followed by 5 mg daily. Because INR was recently > 5, will reduce dose today. We will reduce dose slightly to Coumadin 5 mg daily except 2.5mg  on Mondays and Thursdays Recheck INR on 03/25/15, 13:15 lab; 13:45 [facility], 14:45 treatment. We will see her in the treatment area.  Samples given: Coumadin 5 mg #20 Lot: [id number] Exp: 04/11/16  Theone Murdoch, PharmD

## 2015-03-04 NOTE — Patient Instructions (Signed)
[place] Discharge Instructions for Patients Receiving Chemotherapy  Today you received the following chemotherapy agents Methotrexate/5FU/Cytoxan.  To help prevent nausea and vomiting after your treatment, we encourage you to take your nausea medication as prescribed.   If you develop nausea and vomiting that is not controlled by your nausea medication, call the clinic.   BELOW ARE SYMPTOMS THAT SHOULD BE REPORTED IMMEDIATELY:  *FEVER GREATER THAN 100.5 F  *CHILLS WITH OR WITHOUT FEVER  NAUSEA AND VOMITING THAT IS NOT CONTROLLED WITH YOUR NAUSEA MEDICATION  *UNUSUAL SHORTNESS OF BREATH  *UNUSUAL BRUISING OR BLEEDING  TENDERNESS IN MOUTH AND THROAT WITH OR WITHOUT PRESENCE OF ULCERS  *URINARY PROBLEMS  *BOWEL PROBLEMS  UNUSUAL RASH Items with * indicate a potential emergency and should be followed up as soon as possible.  Feel free to call the clinic you have any questions or concerns. The clinic phone number is [phone].  Please show the [place] at check-in to the Emergency Department and triage nurse.

## 2015-03-05 ENCOUNTER — Ambulatory Visit (HOSPITAL_BASED_OUTPATIENT_CLINIC_OR_DEPARTMENT_OTHER): Payer: No Typology Code available for payment source

## 2015-03-05 DIAGNOSIS — C50212 Malignant neoplasm of upper-inner quadrant of left female breast: Secondary | ICD-10-CM | POA: Diagnosis not present

## 2015-03-05 DIAGNOSIS — C773 Secondary and unspecified malignant neoplasm of axilla and upper limb lymph nodes: Secondary | ICD-10-CM | POA: Diagnosis not present

## 2015-03-05 DIAGNOSIS — G893 Neoplasm related pain (acute) (chronic): Secondary | ICD-10-CM

## 2015-03-05 DIAGNOSIS — Z5189 Encounter for other specified aftercare: Secondary | ICD-10-CM

## 2015-03-05 DIAGNOSIS — E876 Hypokalemia: Secondary | ICD-10-CM

## 2015-03-05 MED ORDER — PEGFILGRASTIM INJECTION 6 MG/0.6ML ~~LOC~~
6.0000 mg | PREFILLED_SYRINGE | Freq: Once | SUBCUTANEOUS | Status: AC
  Administered 2015-03-05: 6 mg via SUBCUTANEOUS
  Filled 2015-03-05: qty 0.6

## 2015-03-06 ENCOUNTER — Ambulatory Visit: Payer: PRIVATE HEALTH INSURANCE

## 2015-03-14 ENCOUNTER — Other Ambulatory Visit: Payer: Self-pay | Admitting: *Deleted

## 2015-03-14 ENCOUNTER — Telehealth: Payer: Self-pay

## 2015-03-14 DIAGNOSIS — C50212 Malignant neoplasm of upper-inner quadrant of left female breast: Secondary | ICD-10-CM

## 2015-03-14 MED ORDER — CYCLOBENZAPRINE HCL 5 MG PO TABS
ORAL_TABLET | ORAL | Status: DC
Start: 2015-03-14 — End: 2015-04-14

## 2015-03-14 MED ORDER — FENTANYL 25 MCG/HR TD PT72: 25.0000 ug | MEDICATED_PATCH | TRANSDERMAL | Status: DC

## 2015-03-14 MED ORDER — LORAZEPAM 0.5 MG PO TABS: 0.5000 mg | ORAL_TABLET | Freq: Four times a day (QID) | ORAL | Status: DC | PRN

## 2015-03-14 MED ORDER — FENTANYL 75 MCG/HR TD PT72: 75.0000 ug | MEDICATED_PATCH | TRANSDERMAL | Status: DC

## 2015-03-14 MED ORDER — OXYCODONE HCL 5 MG PO TABS: 5.0000 mg | ORAL_TABLET | Freq: Three times a day (TID) | ORAL | Status: DC | PRN

## 2015-03-14 NOTE — Telephone Encounter (Signed)
Pt called requesting refill on oxycodone, fentanyl patches, lorazepam and flexeril. Routed to [facility] and Dr Jana Hakim.

## 2015-03-14 NOTE — Telephone Encounter (Signed)
Prescriptions obtained- pt made aware may pick up.

## 2015-03-25 ENCOUNTER — Ambulatory Visit (HOSPITAL_BASED_OUTPATIENT_CLINIC_OR_DEPARTMENT_OTHER): Payer: PRIVATE HEALTH INSURANCE | Admitting: Pharmacist

## 2015-03-25 ENCOUNTER — Ambulatory Visit (HOSPITAL_BASED_OUTPATIENT_CLINIC_OR_DEPARTMENT_OTHER): Payer: PRIVATE HEALTH INSURANCE

## 2015-03-25 ENCOUNTER — Other Ambulatory Visit (HOSPITAL_COMMUNITY)
Admission: RE | Admit: 2015-03-25 | Discharge: 2015-03-25 | Disposition: A | Discharge status: Home / Self Care | Payer: No Typology Code available for payment source | Source: Ambulatory Visit | Attending: Oncology | Admitting: Oncology

## 2015-03-25 ENCOUNTER — Other Ambulatory Visit (HOSPITAL_BASED_OUTPATIENT_CLINIC_OR_DEPARTMENT_OTHER): Payer: PRIVATE HEALTH INSURANCE

## 2015-03-25 ENCOUNTER — Ambulatory Visit (HOSPITAL_BASED_OUTPATIENT_CLINIC_OR_DEPARTMENT_OTHER): Payer: PRIVATE HEALTH INSURANCE | Admitting: Nurse Practitioner

## 2015-03-25 ENCOUNTER — Encounter: Payer: Self-pay | Admitting: Nurse Practitioner

## 2015-03-25 ENCOUNTER — Encounter: Payer: Self-pay | Admitting: *Deleted

## 2015-03-25 VITALS — BP 95/64 | HR 113 | Temp 97.9°F | Resp 18 | Ht 65.5 in | Wt 175.4 lb

## 2015-03-25 DIAGNOSIS — I823 Embolism and thrombosis of renal vein: Secondary | ICD-10-CM

## 2015-03-25 DIAGNOSIS — Z171 Estrogen receptor negative status [ER-]: Secondary | ICD-10-CM

## 2015-03-25 DIAGNOSIS — E876 Hypokalemia: Secondary | ICD-10-CM

## 2015-03-25 DIAGNOSIS — C50212 Malignant neoplasm of upper-inner quadrant of left female breast: Secondary | ICD-10-CM | POA: Diagnosis not present

## 2015-03-25 DIAGNOSIS — C773 Secondary and unspecified malignant neoplasm of axilla and upper limb lymph nodes: Secondary | ICD-10-CM | POA: Diagnosis not present

## 2015-03-25 DIAGNOSIS — G893 Neoplasm related pain (acute) (chronic): Secondary | ICD-10-CM

## 2015-03-25 DIAGNOSIS — Z5111 Encounter for antineoplastic chemotherapy: Secondary | ICD-10-CM

## 2015-03-25 DIAGNOSIS — G620 Drug-induced polyneuropathy: Secondary | ICD-10-CM

## 2015-03-25 DIAGNOSIS — D6481 Anemia due to antineoplastic chemotherapy: Secondary | ICD-10-CM

## 2015-03-25 DIAGNOSIS — T451X5A Adverse effect of antineoplastic and immunosuppressive drugs, initial encounter: Secondary | ICD-10-CM

## 2015-03-25 DIAGNOSIS — I82409 Acute embolism and thrombosis of unspecified deep veins of unspecified lower extremity: Secondary | ICD-10-CM | POA: Insufficient documentation

## 2015-03-25 DIAGNOSIS — C50912 Malignant neoplasm of unspecified site of left female breast: Secondary | ICD-10-CM

## 2015-03-25 LAB — CBC WITH DIFFERENTIAL/PLATELET
BASO%: 0.1 % (ref 0.0–2.0)
BASOS ABS: 0 10*3/uL (ref 0.0–0.1)
EOS ABS: 0.1 10*3/uL (ref 0.0–0.5)
EOS%: 1.2 % (ref 0.0–7.0)
HCT: 31.5 % — ABNORMAL LOW (ref 34.8–46.6)
HGB: 10.3 g/dL — ABNORMAL LOW (ref 11.6–15.9)
LYMPH%: 15.1 % (ref 14.0–49.7)
MCH: 28.6 pg (ref 25.1–34.0)
MCHC: 32.7 g/dL (ref 31.5–36.0)
MCV: 87.5 fL (ref 79.5–101.0)
MONO#: 0.7 10*3/uL (ref 0.1–0.9)
MONO%: 7.1 % (ref 0.0–14.0)
NEUT%: 76.5 % (ref 38.4–76.8)
NEUTROS ABS: 7.1 10*3/uL — AB (ref 1.5–6.5)
PLATELETS: 441 10*3/uL — AB (ref 145–400)
RBC: 3.6 10*6/uL — ABNORMAL LOW (ref 3.70–5.45)
RDW: 19.5 % — ABNORMAL HIGH (ref 11.2–14.5)
WBC: 9.3 10*3/uL (ref 3.9–10.3)
lymph#: 1.4 10*3/uL (ref 0.9–3.3)

## 2015-03-25 LAB — PROTIME-INR
INR: 8.3 — AB (ref 0.00–1.49)
PROTHROMBIN TIME: 69.5 s — AB (ref 11.6–15.2)

## 2015-03-25 LAB — COMPREHENSIVE METABOLIC PANEL (CC13)
ALBUMIN: 2.7 g/dL — AB (ref 3.5–5.0)
ALT: 10 U/L (ref 0–55)
AST: 24 U/L (ref 5–34)
Alkaline Phosphatase: 140 U/L (ref 40–150)
Anion Gap: 6 mEq/L (ref 3–11)
BUN: 10.7 mg/dL (ref 7.0–26.0)
CALCIUM: 9.7 mg/dL (ref 8.4–10.4)
CHLORIDE: 104 meq/L (ref 98–109)
CO2: 27 mEq/L (ref 22–29)
CREATININE: 0.7 mg/dL (ref 0.6–1.1)
EGFR: >90 mL/min/{1.73_m2} (ref >90)
Glucose: 104 mg/dl (ref 70–140)
Potassium: 3.9 mEq/L (ref 3.5–5.1)
SODIUM: 137 meq/L (ref 136–145)
TOTAL PROTEIN: 7.9 g/dL (ref 6.4–8.3)
Total Bilirubin: 0.25 mg/dL (ref 0.20–1.20)

## 2015-03-25 LAB — POCT INR: INR: 8.3

## 2015-03-25 MED ORDER — SODIUM CHLORIDE 0.9 % IJ SOLN
10.0000 mL | INTRAMUSCULAR | Status: DC | PRN
  Administered 2015-03-25: 10 mL
  Filled 2015-03-25: qty 10

## 2015-03-25 MED ORDER — SODIUM CHLORIDE 0.9 % IV SOLN
Freq: Once | INTRAVENOUS | Status: AC
  Administered 2015-03-25: 15:00:00 via INTRAVENOUS
  Filled 2015-03-25: qty 4

## 2015-03-25 MED ORDER — METHOTREXATE SODIUM CHEMO INJECTION 25 MG/ML
40.0000 mg/m2 | Freq: Once | INTRAMUSCULAR | Status: AC
  Administered 2015-03-25: 80 mg via INTRAVENOUS
  Filled 2015-03-25: qty 3.2

## 2015-03-25 MED ORDER — PHYTONADIONE 5 MG PO TABS
2.5000 mg | ORAL_TABLET | Freq: Once | ORAL | Status: AC
  Administered 2015-03-25: 2.5 mg via ORAL
  Filled 2015-03-25: qty 1

## 2015-03-25 MED ORDER — FLUOROURACIL CHEMO INJECTION 2.5 GM/50ML
600.0000 mg/m2 | Freq: Once | INTRAVENOUS | Status: AC
  Administered 2015-03-25: 1200 mg via INTRAVENOUS
  Filled 2015-03-25: qty 24

## 2015-03-25 MED ORDER — SODIUM CHLORIDE 0.9 % IV SOLN
Freq: Once | INTRAVENOUS | Status: AC
  Administered 2015-03-25: 15:00:00 via INTRAVENOUS

## 2015-03-25 MED ORDER — HEPARIN SOD (PORK) LOCK FLUSH 100 UNIT/ML IV SOLN
500.0000 [IU] | Freq: Once | INTRAVENOUS | Status: AC | PRN
  Administered 2015-03-25: 500 [IU]
  Filled 2015-03-25: qty 5

## 2015-03-25 MED ORDER — SODIUM CHLORIDE 0.9 % IV SOLN
600.0000 mg/m2 | Freq: Once | INTRAVENOUS | Status: AC
  Administered 2015-03-25: 1180 mg via INTRAVENOUS
  Filled 2015-03-25: qty 59

## 2015-03-25 NOTE — Progress Notes (Signed)
INR by venopuncture = 8.3. Hg/Hct:  10.3/31.5, Pltc= 441 Pt was taking coumadin as instructed (5mg  daily except 2.5mg  on Mon&Thu). No missed or extra coumadin doses. No changes in diet or medications. No unusual bruising. No bleeding noted. No s/s of clotting noted. Vitamin K 2.5mg  po given in clinic today.  Hold coumadin.  Recheck INR on 03/27/15; Lab at 10:45am, Injection at 11am and coumadin clinic at 11:30am.  Pt aware to avoid sharp objects and report to the ER for bleeding. Plan reviewed with Dr. Jana Hakim. Pt seen in infusion area - no charge encounter.

## 2015-03-25 NOTE — Patient Instructions (Addendum)
Vitamin K 2.5mg  po given in clinic today.  Hold coumadin.  Recheck INR on 03/27/15; Lab at 10:45am, Injection at 11am and coumadin clinic at 11:30am.

## 2015-03-25 NOTE — Progress Notes (Unsigned)
Critical INR to Val @ 8657

## 2015-03-25 NOTE — Progress Notes (Signed)
Per pharmacy, administer chemo in order of Methotrexate/9fu/cytoxan.  Marland Kitchen

## 2015-03-25 NOTE — Patient Instructions (Signed)
[place] Discharge Instructions for Patients Receiving Chemotherapy  Today you received the following chemotherapy agents cytoxan/methotrexate/fluorouracil   To help prevent nausea and vomiting after your treatment, we encourage you to take your nausea medication as directed   If you develop nausea and vomiting that is not controlled by your nausea medication, call the clinic.   BELOW ARE SYMPTOMS THAT SHOULD BE REPORTED IMMEDIATELY:  *FEVER GREATER THAN 100.5 F  *CHILLS WITH OR WITHOUT FEVER  NAUSEA AND VOMITING THAT IS NOT CONTROLLED WITH YOUR NAUSEA MEDICATION  *UNUSUAL SHORTNESS OF BREATH  *UNUSUAL BRUISING OR BLEEDING  TENDERNESS IN MOUTH AND THROAT WITH OR WITHOUT PRESENCE OF ULCERS  *URINARY PROBLEMS  *BOWEL PROBLEMS  UNUSUAL RASH Items with * indicate a potential emergency and should be followed up as soon as possible.  Feel free to call the clinic you have any questions or concerns. The clinic phone number is [phone].

## 2015-03-25 NOTE — Progress Notes (Signed)
ID: Erika [name] OB: Jan 03, 1971  MR#: [id number]  CSN#:[id number]  PCP: [name], MD GYN:   SU: Dr. [name] OTHER MD:  CHIEF COMPLAINT:  metastatic breast cancer  CURRENT TREATMENT: CMF,  Coumadin  BREAST CANCER HISTORY: From Dr. [name] original intake note:  "Patient developed a left sided breast mass show up about 6 months prior to diagnosis. 2 months prior this began getting larger rapidly. She was seen in an urgent care where this mass was thought to be an abscess. Apparently there was an attempt to excise or drain this that was not successful and she was referred to Dr. [name] for evaluation. She underwent punch biopsy on 04/23/13 of her skin and the mass. She was then also sent for mm/us. The biopsy shows invasive high grade carcinoma that appears to be breast. It is ER 14%, PR negative, HER-2/neu negative with a Ki-67 that approaches 100%. She has also undergone mm with at least 10 cm upper inner left breast tumor with mild overyling skin thickening. There is also a 3x10 mm cluster of calcs near there also. There is indeterminate 7 mm level 1 left axillary node, she underwent a lymph node biopsy that was positive for disease. No mm evidence of right breast malignancy. An MRI of the breasts was done on 05/22/13 and showed a 14 cm mass in the left breast protruding from the skin and also a 2.5 cm level II axillary node."  Her subsequent history is as detailed below  INTERVAL HISTORY:   Monte returns today for follow up of her breast cancer, with her finacee, Jermaine. Today is day 1 cycle 7 of Cytoxan, methotrexate, fluorouracil, which she receives every 21 days, with neulasta given on day 2 for granulocyte support.  REVIEW OF SYSTEMS:  Katharina is eating much better with her herbal supplements and is up 10lb since last month. Her nausea is improved as well. She denies fevers, chills, or changes in bowel or bladder habits. She has some moderate fatigue immediately  after treatment, but this resolves with time. She continues on coumadin and today her INR was critically high. She bruises easily, but no bleeding. Her pain is about 6/10 today and is generalized. She continues with the fentanyl patch and oxycodone PRN. She uses gabapentin for the residual neuropathy from a previous chemotherapy regimen. A detailed review of systems is otherwise stable.  PAST MEDICAL HISTORY: Past Medical History  Diagnosis Date  . Breast cancer     Invasive High Grade Carcinoma  . Headache(784.0)     Hx: of Migraines  . Status post chemotherapy     Adriamycin and Cytoxan  . Anxiety     ANXIOUS ABOUT HAVEING BIG SURGERY - MASTECTOMY  . 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 DVT (deep venous thrombosis)     RT ARM-PT ON LOVENOX  . Hx of iron deficiency anemia 11/26/13  . S/P radiation therapy 02-04-14 to 03-18-14                               1) Left Chest Wall / 50 Gy in 25 fractions/ 2) Left Supraclavicular fossa/ 46 Gy in 23 fractions / 3) Left Posterior Axillary boost / 8.533 Gy in 23 fractions/ 4) Left Chest Wall Scar boost / 10 Gy in 5 fractions    PAST SURGICAL HISTORY: Past Surgical History  Procedure Laterality Date  . Tubal ligation    . Portacath  placement Right 05/22/2013    Procedure: INSERTION PORT-A-CATH;  Surgeon: [name], MD;  Location: [facility];  Service: General;  Laterality: Right;  . Left breast needle core biopsy Left 05/21/13  . Left breast needle core  biopsy Left 06/22/13  . Mastectomy modified radical Left 11/15/2013    Procedure: LEFT MASTECTOMY MODIFIED RADICAL;  Surgeon: [name], MD;  Location: [facility];  Service: General;  Laterality: Left;    FAMILY HISTORY Family History  Problem Relation Age of Onset  . Hypertension Mother   . Diabetes Father 52  . Stomach cancer Maternal Aunt   . Hypertension Maternal Grandmother   . Hypertension Maternal Grandfather   . Diabetes Paternal Grandmother   . Diabetes Paternal  Grandfather   . Diabetes Paternal Uncle   . Diabetes Paternal Uncle   . Diabetes Paternal Uncle   The patient's mother is alive, age 64. The patient's father died from complications of diabetes at the age of 5. The patient has 2 brothers, one sister. There is no history of breast or ovarian cancer in the family to her knowledge .the patient has not undergone genetic testing   GYNECOLOGIC HISTORY:  Menarche age 30, first live birth age 80. The patient is GX P3. The patient has been on goserelin since 06/13/2013, given monthly  SOCIAL HISTORY:  The patient has worked at as a Furniture conservator/restorer and a Retail buyer. She is currently applying for disability. At home she lives with her youngest son, Erika [name], 73; with [name]; and with [name].   ADVANCED DIRECTIVES: Not in place  HEALTH MAINTENANCE: History  Substance Use Topics  . Smoking status: Never Smoker   . Smokeless tobacco: Never Used  . Alcohol Use: No     Mammogram: 06/22/2013 Colonoscopy: Bone Density Scan:  Pap Smear:  Eye Exam:  Vitamin D Level:   Lipid Panel:    Allergies  Allergen Reactions  . Doxycycline Nausea And Vomiting    Current Outpatient Prescriptions  Medication Sig Dispense Refill  . cyclobenzaprine (FLEXERIL) 5 MG tablet TAKE 1 TABLET BY MOUTH THREE TIMES A DAY FOR MUSCLE SPASMS 30 tablet 3  . fentaNYL (DURAGESIC - DOSED MCG/HR) 25 MCG/HR patch Place 1 patch (25 mcg total) onto the skin every 3 (three) days. 10 patch 0  . fentaNYL (DURAGESIC - DOSED MCG/HR) 75 MCG/HR Place 1 patch (75 mcg total) onto the skin every 3 (three) days. 10 patch 0  . gabapentin (NEURONTIN) 300 MG capsule Take 1 capsule (300 mg total) by mouth 3 (three) times daily. 90 capsule 4  . loratadine (CLARITIN) 10 MG tablet Take 10 mg by mouth daily as needed for allergies.    Marland Kitchen LORazepam (ATIVAN) 0.5 MG tablet Take 1 tablet (0.5 mg total) by mouth every 6 (six) hours as needed for anxiety.  30 tablet 3  . oxyCODONE (OXY IR/ROXICODONE) 5 MG immediate release tablet Take 1 tablet (5 mg total) by mouth every 8 (eight) hours as needed for severe pain or breakthrough pain. 90 tablet 0  . potassium chloride SA (K-DUR,KLOR-CON) 20 MEQ tablet Take 1 tablet (20 mEq total) by mouth 4 (four) times daily. 120 tablet 1  . warfarin (COUMADIN) 5 MG tablet Take 2.5-5 mg by mouth daily. Patient is getting regulated on Coumadin. Pt takes 5 mg daily except on Mondays and Thursdays, she takes 2.5 mg.    . zolpidem (AMBIEN) 5 MG tablet Take 1 tablet (5 mg total) by mouth at bedtime  as needed for sleep. 30 tablet 0  . ondansetron (ZOFRAN) 8 MG tablet Take by mouth every 8 (eight) hours as needed for nausea or vomiting.    . traMADol (ULTRAM) 50 MG tablet Take 1-2 tablets (50-100 mg total) by mouth every 6 (six) hours as needed. (Patient not taking: Reported on 03/25/2015) 30 tablet 1   No current facility-administered medications for this visit.   Facility-Administered Medications Ordered in Other Visits  Medication Dose Route Frequency Provider Last Rate Last Dose  . topical emolient (BIAFINE) emulsion   Topical Daily [name], MD        OBJECTIVE: middle-aged African American woman who appears stated age 16 Vitals:   03/25/15 1352  BP: 95/64  Pulse: 113  Temp: 97.9 F (36.6 C)  Resp: 18     Body mass index is 28.73 kg/(m^2).      ECOG FS:1 - Symptomatic but completely ambulatory   Skin: warm, dry  HEENT: sclerae anicteric, conjunctivae pink, oropharynx clear. No thrush or mucositis.  Lymph Nodes: No cervical or supraclavicular lymphadenopathy  Lungs: clear to auscultation bilaterally, no rales, wheezes, or rhonci  Heart: regular rate and rhythm  Abdomen: round, soft, non tender, positive bowel sounds  Musculoskeletal: No focal spinal tenderness, no peripheral edema  Neuro: non focal, well oriented, positive affect  Breasts: deferred   LAB RESULTS:  CMP     Component Value  Date/Time   NA 137 03/25/2015 1331   NA 137 02/24/2015 0624   K 3.9 03/25/2015 1331   K 4.1 02/24/2015 0624   CL 103 02/24/2015 0624   CL 102 06/01/2013 1500   CO2 27 03/25/2015 1331   CO2 26 02/24/2015 0624   GLUCOSE 104 03/25/2015 1331   GLUCOSE 94 02/24/2015 0624   GLUCOSE 95 06/01/2013 1500   BUN 10.7 03/25/2015 1331   BUN 7 02/24/2015 0624   CREATININE 0.7 03/25/2015 1331   CREATININE 0.67 02/24/2015 0624   CALCIUM 9.7 03/25/2015 1331   CALCIUM 9.2 02/24/2015 0624   PROT 7.9 03/25/2015 1331   PROT 8.1 02/21/2015 2100   ALBUMIN 2.7* 03/25/2015 1331   ALBUMIN 3.2* 02/21/2015 2100   AST 24 03/25/2015 1331   AST 31 02/21/2015 2100   ALT 10 03/25/2015 1331   ALT 20 02/21/2015 2100   ALKPHOS 140 03/25/2015 1331   ALKPHOS 123* 02/21/2015 2100   BILITOT 0.25 03/25/2015 1331   BILITOT 0.6 02/21/2015 2100   GFRNONAA >90 02/24/2015 0624   GFRAA >90 02/24/2015 0624    I No results found for: SPEP  Lab Results  Component Value Date   WBC 9.3 03/25/2015   NEUTROABS 7.1* 03/25/2015   HGB 10.3* 03/25/2015   HCT 31.5* 03/25/2015   MCV 87.5 03/25/2015   PLT 441* 03/25/2015      Chemistry      Component Value Date/Time   NA 137 03/25/2015 1331   NA 137 02/24/2015 0624   K 3.9 03/25/2015 1331   K 4.1 02/24/2015 0624   CL 103 02/24/2015 0624   CL 102 06/01/2013 1500   CO2 27 03/25/2015 1331   CO2 26 02/24/2015 0624   BUN 10.7 03/25/2015 1331   BUN 7 02/24/2015 0624   CREATININE 0.7 03/25/2015 1331   CREATININE 0.67 02/24/2015 0624      Component Value Date/Time   CALCIUM 9.7 03/25/2015 1331   CALCIUM 9.2 02/24/2015 0624   ALKPHOS 140 03/25/2015 1331   ALKPHOS 123* 02/21/2015 2100   AST 24 03/25/2015 1331  AST 31 02/21/2015 2100   ALT 10 03/25/2015 1331   ALT 20 02/21/2015 2100   BILITOT 0.25 03/25/2015 1331   BILITOT 0.6 02/21/2015 2100       Lab Results  Component Value Date   LABCA2 35 05/18/2013    No components found for: LABCA125   Recent  Labs Lab 03/25/15 1329  INR Sent out for confirmation    Urinalysis    Component Value Date/Time   COLORURINE YELLOW 02/24/2015 1632    STUDIES: No results found.  ASSESSMENT: 44 y.o.  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  (0) genetic testing of the ATM, BARD1, BRCA1, BRCA2, BRIP1, CDH1, CHEK2, EPCAM, FANCC, MLH1, MSH2, MSH6, NBN, PALB2, PMS2, PTEN, RAD51C, RAD51D, STK11, TP53, and XRCC2 genes showed no deleterious mutations  (1) status post left breast biopsy 04/23/2013 for a clinical T4 N1, stage IIIB invasive ductal carcinoma, grade 3,  estrogen receptor 14% positive with moderate staining intensity, progesterone receptor negative,  with an MIB-1 of  100% and HER-2/neu  nonamplified  (2) biopsy of a left axillary lymph node [id number] was positive  (3) started monthly goserelin July of 02/01/2013  (4)  received  neoadjuvant chemotherapy with doxorubicin and cyclophosphamide in dose dense fashion x4, followed by carboplatin and paclitaxel x1, discontinued due to neuropathy, followed by carboplatin and gemcitabine, both given day 1 of each 14 day cycle x6, completed 10/12/2013 .   (5) right upper extremity DVT documented 06/29/2013,  treated with Lovenox from  July  through  December 2014    (6) status post left modified radical mastectomy 11/15/2013 for a residual pT3 pN0 invasive ductal carcinoma, grade 3, with repeat prognostic panel triple negative. All 12 axillary lymph nodes were clear. Margins were negative   (5) completed adjuvant radiation therapy 03/18/2014,  with capecitabine added 2-3 weeks into treatment.  METASTATIC DISEASE  (6) CT scans of the chest, abdomen, and pelvis 06/26/2014 showed a large right pleural effusion  with pleural studding, 2 left upper lobe nodules, and two  liver lesions. Cytology from right thoracentesis 07/03/2014 showed malignant cells consistent with the patient's known primary  (7) started eribulin  07/09/2014, given days 1 and 8 of each 21 day cycle; restagind studies after cycle 3 showed evidence of response. Discontinued after cycle 5 (last dose 10/08/14) because of worsening peripheral neuropathy--also with progression.   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 Antithrombin level was normal.  PLAN:  Anyelina is doing well today. The labs were reviewed in detail and were entirely stable. She will proceed with cycle 7 of cyclophosphamide, methotrexate, and fluorouracil as planned today. She will return on Thursday for her neulasta injection. At this time she is not interested in the onpro body injector.   I am refilling her gabapentin today for her neuropathy symptoms. She picked up refills for her narcotics earlier this month.  Laylia will return in 3 weeks for labs, a follow up visit, and cycle 8 of treatment. After this cycle she will have repeat staging scans. She understands and agrees with this plan. She knows the goal of treatment in  her case is control. She has been encouraged to call with any issues that might arise before her next visit here.   [name], NP 03/25/2015 2:29 PM

## 2015-03-26 ENCOUNTER — Telehealth: Payer: Self-pay | Admitting: Oncology

## 2015-03-26 NOTE — Telephone Encounter (Signed)
per Brandy to sch pt CC-pt aware °

## 2015-03-27 ENCOUNTER — Other Ambulatory Visit (HOSPITAL_BASED_OUTPATIENT_CLINIC_OR_DEPARTMENT_OTHER): Payer: No Typology Code available for payment source

## 2015-03-27 ENCOUNTER — Ambulatory Visit: Payer: PRIVATE HEALTH INSURANCE | Admitting: Pharmacist

## 2015-03-27 ENCOUNTER — Telehealth: Payer: Self-pay | Admitting: Oncology

## 2015-03-27 ENCOUNTER — Other Ambulatory Visit (HOSPITAL_COMMUNITY)
Admission: RE | Admit: 2015-03-27 | Discharge: 2015-03-27 | Disposition: A | Discharge status: Home / Self Care | Payer: No Typology Code available for payment source | Source: Ambulatory Visit | Attending: Oncology | Admitting: Oncology

## 2015-03-27 ENCOUNTER — Ambulatory Visit (HOSPITAL_BASED_OUTPATIENT_CLINIC_OR_DEPARTMENT_OTHER): Payer: No Typology Code available for payment source

## 2015-03-27 ENCOUNTER — Encounter: Payer: Self-pay | Admitting: Lab

## 2015-03-27 VITALS — BP 104/58 | HR 106 | Temp 98.8°F

## 2015-03-27 DIAGNOSIS — C773 Secondary and unspecified malignant neoplasm of axilla and upper limb lymph nodes: Secondary | ICD-10-CM | POA: Diagnosis not present

## 2015-03-27 DIAGNOSIS — G893 Neoplasm related pain (acute) (chronic): Secondary | ICD-10-CM

## 2015-03-27 DIAGNOSIS — C50212 Malignant neoplasm of upper-inner quadrant of left female breast: Secondary | ICD-10-CM

## 2015-03-27 DIAGNOSIS — D6481 Anemia due to antineoplastic chemotherapy: Secondary | ICD-10-CM

## 2015-03-27 DIAGNOSIS — I823 Embolism and thrombosis of renal vein: Secondary | ICD-10-CM | POA: Diagnosis not present

## 2015-03-27 DIAGNOSIS — C50912 Malignant neoplasm of unspecified site of left female breast: Secondary | ICD-10-CM

## 2015-03-27 DIAGNOSIS — Z5189 Encounter for other specified aftercare: Secondary | ICD-10-CM | POA: Diagnosis not present

## 2015-03-27 DIAGNOSIS — I82409 Acute embolism and thrombosis of unspecified deep veins of unspecified lower extremity: Secondary | ICD-10-CM | POA: Diagnosis present

## 2015-03-27 DIAGNOSIS — T451X5A Adverse effect of antineoplastic and immunosuppressive drugs, initial encounter: Secondary | ICD-10-CM

## 2015-03-27 LAB — PROTIME-INR
INR: 5.34 (ref 0.00–1.49)
Prothrombin Time: 49.3 seconds — ABNORMAL HIGH (ref 11.6–15.2)

## 2015-03-27 LAB — POCT INR: INR: 5.34

## 2015-03-27 MED ORDER — PEGFILGRASTIM INJECTION 6 MG/0.6ML ~~LOC~~
6.0000 mg | PREFILLED_SYRINGE | Freq: Once | SUBCUTANEOUS | Status: AC
  Administered 2015-03-27: 6 mg via SUBCUTANEOUS
  Filled 2015-03-27: qty 0.6

## 2015-03-27 NOTE — Progress Notes (Signed)
INR above goal still at 5.34 (Goal 2-3) INR was 8.3 on 4/12 and pt received Vit K 2.5 po and has held coumadin ever since *Telephone Encounter - No charge* as lab was send out Spoke to patient over the phone regarding INR Pt reports no unusual bleeding or bruising No other changes or issues to report Pt held coumadin as instructed and ate some "greens" last night INR slowly decreasing down after Vit K dose and holding coumadin Will continue holding coumadin for the next 3 days and then will decrease dose by ~40%.   Plan:  Hold coumadin for 3 more days.  Do not restart coumadin until Sunday.  Decrease dose to 2.5 mg (half tablet) starting Sunday. Recheck INR on 04/02/15; Lab at 11:15am, coumadin clinic at 11:30am.

## 2015-03-27 NOTE — Telephone Encounter (Signed)
per Gerald Stabs in [place] tyo add CC appt

## 2015-03-27 NOTE — Progress Notes (Unsigned)
Critical INR called to Erika [name] at 12:20 pm

## 2015-03-27 NOTE — Patient Instructions (Signed)
INR above goal Hold coumadin for 3 more days.  Do not restart coumadin until Sunday.  Decrease dose to 2.5 mg (half tablet) starting Sunday. Recheck INR on 04/02/15; Lab at 11:15am, coumadin clinic at 11:30am.

## 2015-03-28 ENCOUNTER — Ambulatory Visit: Payer: PRIVATE HEALTH INSURANCE

## 2015-03-28 ENCOUNTER — Other Ambulatory Visit: Payer: PRIVATE HEALTH INSURANCE

## 2015-04-02 ENCOUNTER — Ambulatory Visit (HOSPITAL_BASED_OUTPATIENT_CLINIC_OR_DEPARTMENT_OTHER): Payer: No Typology Code available for payment source | Admitting: Pharmacist

## 2015-04-02 ENCOUNTER — Other Ambulatory Visit (HOSPITAL_BASED_OUTPATIENT_CLINIC_OR_DEPARTMENT_OTHER): Payer: No Typology Code available for payment source

## 2015-04-02 DIAGNOSIS — T451X5A Adverse effect of antineoplastic and immunosuppressive drugs, initial encounter: Secondary | ICD-10-CM

## 2015-04-02 DIAGNOSIS — C50212 Malignant neoplasm of upper-inner quadrant of left female breast: Secondary | ICD-10-CM

## 2015-04-02 DIAGNOSIS — I823 Embolism and thrombosis of renal vein: Secondary | ICD-10-CM

## 2015-04-02 DIAGNOSIS — D6481 Anemia due to antineoplastic chemotherapy: Secondary | ICD-10-CM

## 2015-04-02 DIAGNOSIS — C50912 Malignant neoplasm of unspecified site of left female breast: Secondary | ICD-10-CM

## 2015-04-02 LAB — COMPREHENSIVE METABOLIC PANEL (CC13)
ALK PHOS: 150 U/L (ref 40–150)
ALT: 31 U/L (ref 0–55)
AST: 57 U/L — AB (ref 5–34)
Albumin: 2.7 g/dL — ABNORMAL LOW (ref 3.5–5.0)
Anion Gap: 12 mEq/L — ABNORMAL HIGH (ref 3–11)
BUN: 8.1 mg/dL (ref 7.0–26.0)
CO2: 23 mEq/L (ref 22–29)
Calcium: 9.4 mg/dL (ref 8.4–10.4)
Chloride: 105 mEq/L (ref 98–109)
Creatinine: 0.7 mg/dL (ref 0.6–1.1)
Glucose: 103 mg/dl (ref 70–140)
Potassium: 3.6 mEq/L (ref 3.5–5.1)
Sodium: 141 mEq/L (ref 136–145)
TOTAL PROTEIN: 7.8 g/dL (ref 6.4–8.3)
Total Bilirubin: 0.36 mg/dL (ref 0.20–1.20)

## 2015-04-02 LAB — CBC WITH DIFFERENTIAL/PLATELET
BASO%: 0.4 % (ref 0.0–2.0)
Basophils Absolute: 0 10*3/uL (ref 0.0–0.1)
EOS%: 1.3 % (ref 0.0–7.0)
Eosinophils Absolute: 0 10*3/uL (ref 0.0–0.5)
HCT: 29.5 % — ABNORMAL LOW (ref 34.8–46.6)
HEMOGLOBIN: 9.4 g/dL — AB (ref 11.6–15.9)
LYMPH#: 0.6 10*3/uL — AB (ref 0.9–3.3)
LYMPH%: 26.3 % (ref 14.0–49.7)
MCH: 27.8 pg (ref 25.1–34.0)
MCHC: 31.9 g/dL (ref 31.5–36.0)
MCV: 86.9 fL (ref 79.5–101.0)
MONO#: 0 10*3/uL — ABNORMAL LOW (ref 0.1–0.9)
MONO%: 1.8 % (ref 0.0–14.0)
NEUT#: 1.6 10*3/uL (ref 1.5–6.5)
NEUT%: 70.2 % (ref 38.4–76.8)
Platelets: 146 10*3/uL (ref 145–400)
RBC: 3.4 10*6/uL — ABNORMAL LOW (ref 3.70–5.45)
RDW: 21 % — AB (ref 11.2–14.5)
WBC: 2.2 10*3/uL — ABNORMAL LOW (ref 3.9–10.3)

## 2015-04-02 LAB — PROTIME-INR
INR: 3.4 (ref 2.00–3.50)
Protime: 40.8 Seconds — ABNORMAL HIGH (ref 10.6–13.4)

## 2015-04-02 LAB — POCT INR: INR: 3.4

## 2015-04-02 NOTE — Progress Notes (Signed)
INR = 3.4 on Coumadin 2.5 mg daily Hgb = 9.4 g/dL today but was treated w/ CMF last week. Still no obvious cause for her INR to be elevated last week (4/12 = 8.3).  She reports no GI illness, no herbal supplement use, no EtOH consumption, no abx/antifungals/steroids (other than pre-meds for chemo on 03/25/15). Pt states she has had no bleeding/bruising. Her appetite is "pretty good."  Her albumin is low but has been same for at least 1 month. INR nearing back toward goal today after she held 5 days of Coumadin last week and received 1 dose of vit K.  She resumed Coumadin on Sunday (4/17) at dose of 2.5 mg daily. I will keep her on 2.5 mg daily for now. Recheck INR in 1 week. Kennith Center, Pharm.D., CPP 04/02/2015@11 :44 AM

## 2015-04-10 ENCOUNTER — Other Ambulatory Visit (HOSPITAL_BASED_OUTPATIENT_CLINIC_OR_DEPARTMENT_OTHER): Payer: No Typology Code available for payment source

## 2015-04-10 ENCOUNTER — Ambulatory Visit (HOSPITAL_BASED_OUTPATIENT_CLINIC_OR_DEPARTMENT_OTHER): Payer: No Typology Code available for payment source | Admitting: Pharmacist

## 2015-04-10 DIAGNOSIS — T451X5A Adverse effect of antineoplastic and immunosuppressive drugs, initial encounter: Secondary | ICD-10-CM

## 2015-04-10 DIAGNOSIS — C50912 Malignant neoplasm of unspecified site of left female breast: Secondary | ICD-10-CM

## 2015-04-10 DIAGNOSIS — I823 Embolism and thrombosis of renal vein: Secondary | ICD-10-CM | POA: Diagnosis not present

## 2015-04-10 DIAGNOSIS — C50212 Malignant neoplasm of upper-inner quadrant of left female breast: Secondary | ICD-10-CM

## 2015-04-10 DIAGNOSIS — D6481 Anemia due to antineoplastic chemotherapy: Secondary | ICD-10-CM

## 2015-04-10 LAB — CBC WITH DIFFERENTIAL/PLATELET
BASO%: 0.3 % (ref 0.0–2.0)
Basophils Absolute: 0 10*3/uL (ref 0.0–0.1)
EOS ABS: 0.1 10*3/uL (ref 0.0–0.5)
EOS%: 1.8 % (ref 0.0–7.0)
HCT: 33.1 % — ABNORMAL LOW (ref 34.8–46.6)
HGB: 10.6 g/dL — ABNORMAL LOW (ref 11.6–15.9)
LYMPH%: 14.2 % (ref 14.0–49.7)
MCH: 28 pg (ref 25.1–34.0)
MCHC: 32 g/dL (ref 31.5–36.0)
MCV: 87.3 fL (ref 79.5–101.0)
MONO#: 0.5 10*3/uL (ref 0.1–0.9)
MONO%: 7.2 % (ref 0.0–14.0)
NEUT%: 76.5 % (ref 38.4–76.8)
NEUTROS ABS: 5.1 10*3/uL (ref 1.5–6.5)
PLATELETS: 328 10*3/uL (ref 145–400)
RBC: 3.79 10*6/uL (ref 3.70–5.45)
RDW: 21 % — AB (ref 11.2–14.5)
WBC: 6.7 10*3/uL (ref 3.9–10.3)
lymph#: 1 10*3/uL (ref 0.9–3.3)

## 2015-04-10 LAB — COMPREHENSIVE METABOLIC PANEL (CC13)
ALT: 34 U/L (ref 0–55)
AST: 26 U/L (ref 5–34)
Albumin: 2.9 g/dL — ABNORMAL LOW (ref 3.5–5.0)
Alkaline Phosphatase: 167 U/L — ABNORMAL HIGH (ref 40–150)
Anion Gap: 10 mEq/L (ref 3–11)
BILIRUBIN TOTAL: 0.27 mg/dL (ref 0.20–1.20)
BUN: 8.6 mg/dL (ref 7.0–26.0)
CO2: 25 mEq/L (ref 22–29)
Calcium: 10 mg/dL (ref 8.4–10.4)
Chloride: 107 mEq/L (ref 98–109)
Creatinine: 0.8 mg/dL (ref 0.6–1.1)
GLUCOSE: 89 mg/dL (ref 70–140)
Potassium: 3.6 mEq/L (ref 3.5–5.1)
SODIUM: 142 meq/L (ref 136–145)
TOTAL PROTEIN: 7.8 g/dL (ref 6.4–8.3)

## 2015-04-10 LAB — PROTIME-INR
INR: 3.2 (ref 2.00–3.50)
Protime: 38.4 Seconds — ABNORMAL HIGH (ref 10.6–13.4)

## 2015-04-10 LAB — POCT INR: INR: 3.2

## 2015-04-10 NOTE — Patient Instructions (Signed)
Continue Coumadin 2.5 mg (half tablet) daily.  Recheck INR with scheduled appts on 04/15/15; Lab at 10:45am, Appt with Heather at 11:15am, Treatment at 12:15pm and coumadin clinic at 12:30pm

## 2015-04-10 NOTE — Progress Notes (Signed)
INR essentially at goal. Hg/Hct:  10.6/33.1, Pltc: 328 INR stable, continuing to decrease slightly. Pt took coumadin as instructed. No missed or extra coumadin doses. No changes in diet or medications. No unusual bruising. No bleeding noted. No s/s of clotting noted. Pt doing well. Since INR is consistent, stable and slightly decreased since last visit, will continue same dose. Pt is doing well. Will recheck INR in 5 days with next treatment. Continue Coumadin 2.5 mg (half tablet) daily.  Recheck INR with scheduled appts on 04/15/15; Lab at 10:45am, Heather at 11:15am, Treatment at 12:15pm and coumadin clinic at 12:30pm

## 2015-04-14 ENCOUNTER — Telehealth: Payer: Self-pay

## 2015-04-14 DIAGNOSIS — C50212 Malignant neoplasm of upper-inner quadrant of left female breast: Secondary | ICD-10-CM

## 2015-04-14 DIAGNOSIS — G629 Polyneuropathy, unspecified: Secondary | ICD-10-CM

## 2015-04-14 MED ORDER — CYCLOBENZAPRINE HCL 5 MG PO TABS: ORAL_TABLET | ORAL | Status: DC

## 2015-04-14 MED ORDER — FENTANYL 75 MCG/HR TD PT72: 75.0000 ug | MEDICATED_PATCH | TRANSDERMAL | Status: DC

## 2015-04-14 MED ORDER — GABAPENTIN 300 MG PO CAPS: 300.0000 mg | ORAL_CAPSULE | Freq: Three times a day (TID) | ORAL | Status: DC

## 2015-04-14 MED ORDER — OXYCODONE HCL 5 MG PO TABS: 5.0000 mg | ORAL_TABLET | Freq: Three times a day (TID) | ORAL | Status: DC | PRN

## 2015-04-14 MED ORDER — FENTANYL 25 MCG/HR TD PT72: 25.0000 ug | MEDICATED_PATCH | TRANSDERMAL | Status: DC

## 2015-04-14 NOTE — Telephone Encounter (Signed)
Pt called for refill on oxycodone, fentanyl patches, flexaril and gabaptentin. Rx prepared for signature.

## 2015-04-15 ENCOUNTER — Ambulatory Visit (HOSPITAL_BASED_OUTPATIENT_CLINIC_OR_DEPARTMENT_OTHER): Payer: No Typology Code available for payment source | Admitting: Nurse Practitioner

## 2015-04-15 ENCOUNTER — Ambulatory Visit (HOSPITAL_BASED_OUTPATIENT_CLINIC_OR_DEPARTMENT_OTHER): Payer: No Typology Code available for payment source

## 2015-04-15 ENCOUNTER — Ambulatory Visit (HOSPITAL_BASED_OUTPATIENT_CLINIC_OR_DEPARTMENT_OTHER): Payer: No Typology Code available for payment source | Admitting: Pharmacist

## 2015-04-15 ENCOUNTER — Other Ambulatory Visit (HOSPITAL_BASED_OUTPATIENT_CLINIC_OR_DEPARTMENT_OTHER): Payer: No Typology Code available for payment source

## 2015-04-15 ENCOUNTER — Telehealth: Payer: Self-pay | Admitting: Nurse Practitioner

## 2015-04-15 ENCOUNTER — Other Ambulatory Visit: Payer: Self-pay | Admitting: *Deleted

## 2015-04-15 ENCOUNTER — Encounter: Payer: Self-pay | Admitting: Nurse Practitioner

## 2015-04-15 VITALS — BP 101/70 | HR 105 | Temp 98.7°F | Resp 18 | Ht 65.5 in | Wt 174.5 lb

## 2015-04-15 DIAGNOSIS — C50212 Malignant neoplasm of upper-inner quadrant of left female breast: Secondary | ICD-10-CM | POA: Diagnosis not present

## 2015-04-15 DIAGNOSIS — T451X5A Adverse effect of antineoplastic and immunosuppressive drugs, initial encounter: Secondary | ICD-10-CM

## 2015-04-15 DIAGNOSIS — C50912 Malignant neoplasm of unspecified site of left female breast: Secondary | ICD-10-CM

## 2015-04-15 DIAGNOSIS — G893 Neoplasm related pain (acute) (chronic): Secondary | ICD-10-CM

## 2015-04-15 DIAGNOSIS — I823 Embolism and thrombosis of renal vein: Secondary | ICD-10-CM

## 2015-04-15 DIAGNOSIS — E876 Hypokalemia: Secondary | ICD-10-CM

## 2015-04-15 DIAGNOSIS — G620 Drug-induced polyneuropathy: Secondary | ICD-10-CM | POA: Diagnosis not present

## 2015-04-15 DIAGNOSIS — G629 Polyneuropathy, unspecified: Secondary | ICD-10-CM

## 2015-04-15 DIAGNOSIS — Z5111 Encounter for antineoplastic chemotherapy: Secondary | ICD-10-CM | POA: Diagnosis not present

## 2015-04-15 DIAGNOSIS — D6481 Anemia due to antineoplastic chemotherapy: Secondary | ICD-10-CM

## 2015-04-15 LAB — COMPREHENSIVE METABOLIC PANEL (CC13)
ALT: 17 U/L (ref 0–55)
ANION GAP: 9 meq/L (ref 3–11)
AST: 29 U/L (ref 5–34)
Albumin: 2.9 g/dL — ABNORMAL LOW (ref 3.5–5.0)
Alkaline Phosphatase: 149 U/L (ref 40–150)
BUN: 10.8 mg/dL (ref 7.0–26.0)
CALCIUM: 9.9 mg/dL (ref 8.4–10.4)
CHLORIDE: 102 meq/L (ref 98–109)
CO2: 27 meq/L (ref 22–29)
CREATININE: 0.7 mg/dL (ref 0.6–1.1)
EGFR: >90 mL/min/{1.73_m2} (ref >90)
Glucose: 89 mg/dl (ref 70–140)
Potassium: 3.9 mEq/L (ref 3.5–5.1)
Sodium: 139 mEq/L (ref 136–145)
TOTAL PROTEIN: 8.1 g/dL (ref 6.4–8.3)
Total Bilirubin: 0.27 mg/dL (ref 0.20–1.20)

## 2015-04-15 LAB — CBC WITH DIFFERENTIAL/PLATELET
BASO%: 0.1 % (ref 0.0–2.0)
Basophils Absolute: 0 10*3/uL (ref 0.0–0.1)
EOS%: 1.5 % (ref 0.0–7.0)
Eosinophils Absolute: 0.1 10*3/uL (ref 0.0–0.5)
HEMATOCRIT: 32.8 % — AB (ref 34.8–46.6)
HGB: 10.5 g/dL — ABNORMAL LOW (ref 11.6–15.9)
LYMPH%: 14.3 % (ref 14.0–49.7)
MCH: 28.5 pg (ref 25.1–34.0)
MCHC: 32 g/dL (ref 31.5–36.0)
MCV: 88.9 fL (ref 79.5–101.0)
MONO#: 0.9 10*3/uL (ref 0.1–0.9)
MONO%: 11.1 % (ref 0.0–14.0)
NEUT%: 73 % (ref 38.4–76.8)
NEUTROS ABS: 6 10*3/uL (ref 1.5–6.5)
Platelets: 380 10*3/uL (ref 145–400)
RBC: 3.69 10*6/uL — ABNORMAL LOW (ref 3.70–5.45)
RDW: 19 % — ABNORMAL HIGH (ref 11.2–14.5)
WBC: 8.2 10*3/uL (ref 3.9–10.3)
lymph#: 1.2 10*3/uL (ref 0.9–3.3)

## 2015-04-15 LAB — PROTIME-INR
INR: 2.5 (ref 2.00–3.50)
PROTIME: 30 s — AB (ref 10.6–13.4)

## 2015-04-15 LAB — POCT INR: INR: 2.5

## 2015-04-15 MED ORDER — SODIUM CHLORIDE 0.9 % IV SOLN
Freq: Once | INTRAVENOUS | Status: AC
  Administered 2015-04-15: 13:00:00 via INTRAVENOUS

## 2015-04-15 MED ORDER — OXYCODONE HCL 5 MG PO TABS: 5.0000 mg | ORAL_TABLET | Freq: Three times a day (TID) | ORAL | Status: DC | PRN

## 2015-04-15 MED ORDER — CYCLOPHOSPHAMIDE CHEMO INJECTION 1 GM
600.0000 mg/m2 | Freq: Once | INTRAMUSCULAR | Status: AC
  Administered 2015-04-15: 1180 mg via INTRAVENOUS
  Filled 2015-04-15: qty 59

## 2015-04-15 MED ORDER — SODIUM CHLORIDE 0.9 % IJ SOLN
3.0000 mL | INTRAMUSCULAR | Status: DC | PRN
  Filled 2015-04-15: qty 10

## 2015-04-15 MED ORDER — METHOTREXATE SODIUM (PF) CHEMO INJECTION 250 MG/10ML
80.0000 mg | Freq: Once | INTRAMUSCULAR | Status: AC
  Administered 2015-04-15: 80 mg via INTRAVENOUS
  Filled 2015-04-15: qty 3.2

## 2015-04-15 MED ORDER — FLUOROURACIL CHEMO INJECTION 2.5 GM/50ML
600.0000 mg/m2 | Freq: Once | INTRAVENOUS | Status: AC
  Administered 2015-04-15: 1200 mg via INTRAVENOUS
  Filled 2015-04-15: qty 24

## 2015-04-15 MED ORDER — LORAZEPAM 0.5 MG PO TABS: 0.5000 mg | ORAL_TABLET | Freq: Four times a day (QID) | ORAL | Status: DC | PRN

## 2015-04-15 MED ORDER — FENTANYL 25 MCG/HR TD PT72: 25.0000 ug | MEDICATED_PATCH | TRANSDERMAL | Status: DC

## 2015-04-15 MED ORDER — HEPARIN SOD (PORK) LOCK FLUSH 100 UNIT/ML IV SOLN
500.0000 [IU] | Freq: Once | INTRAVENOUS | Status: AC | PRN
  Administered 2015-04-15: 500 [IU]
  Filled 2015-04-15: qty 5

## 2015-04-15 MED ORDER — SODIUM CHLORIDE 0.9 % IV SOLN
Freq: Once | INTRAVENOUS | Status: AC
  Administered 2015-04-15: 13:00:00 via INTRAVENOUS
  Filled 2015-04-15: qty 4

## 2015-04-15 MED ORDER — CYCLOBENZAPRINE HCL 5 MG PO TABS
ORAL_TABLET | ORAL | Status: DC
Start: 2015-04-15 — End: 2015-05-14

## 2015-04-15 MED ORDER — GABAPENTIN 300 MG PO CAPS: 300.0000 mg | ORAL_CAPSULE | Freq: Three times a day (TID) | ORAL | Status: DC

## 2015-04-15 MED ORDER — SODIUM CHLORIDE 0.9 % IJ SOLN
10.0000 mL | INTRAMUSCULAR | Status: DC | PRN
  Administered 2015-04-15: 10 mL
  Filled 2015-04-15: qty 10

## 2015-04-15 MED ORDER — FENTANYL 75 MCG/HR TD PT72: 75.0000 ug | MEDICATED_PATCH | TRANSDERMAL | Status: DC

## 2015-04-15 NOTE — Telephone Encounter (Signed)
Appointments made and avs printed for patient °

## 2015-04-15 NOTE — Patient Instructions (Signed)
[facility] Discharge Instructions for Patients Receiving Chemotherapy  Today you received the following chemotherapy agents Cytoxan, Methatrexate and 5FU.  To help prevent nausea and vomiting after your treatment, we encourage you to take your nausea medication as prescribed.   If you develop nausea and vomiting that is not controlled by your nausea medication, call the clinic.   BELOW ARE SYMPTOMS THAT SHOULD BE REPORTED IMMEDIATELY:  *FEVER GREATER THAN 100.5 F  *CHILLS WITH OR WITHOUT FEVER  NAUSEA AND VOMITING THAT IS NOT CONTROLLED WITH YOUR NAUSEA MEDICATION  *UNUSUAL SHORTNESS OF BREATH  *UNUSUAL BRUISING OR BLEEDING  TENDERNESS IN MOUTH AND THROAT WITH OR WITHOUT PRESENCE OF ULCERS  *URINARY PROBLEMS  *BOWEL PROBLEMS  UNUSUAL RASH Items with * indicate a potential emergency and should be followed up as soon as possible.  Feel free to call the clinic you have any questions or concerns. The clinic phone number is [phone].  Please show the [facility] at check-in to the Emergency Department and triage nurse.

## 2015-04-15 NOTE — Telephone Encounter (Signed)
S/w pt that her prescriptions were in the Rx book. She stated she never got a phone call they were ready for pickup and had Nira Conn give her prescriptions today.

## 2015-04-15 NOTE — Progress Notes (Signed)
INR = 2.5    Goal 2-3 INR is within goal range. No complications of anticoagulation noted. Patient seen during treatment today. No new medications or dietary changes. She will continue Coumadin 2.5 mg (half tablet) daily.  We will recheck INR on 05/05/15; Lab at 10am, Dr. Jana Hakim at 10:30am, and treatment at 11:15. We will see her during treatment.  Theone Murdoch, PharmD

## 2015-04-15 NOTE — Progress Notes (Signed)
ID: Erika [name] OB: 02-03-1971  MR#: [id number]  CSN#:[id number]  PCP: [name], MD GYN:   SU: Dr. [name] OTHER MD:  CHIEF COMPLAINT:  metastatic breast cancer  CURRENT TREATMENT: [facility],  Coumadin  BREAST CANCER HISTORY: From Dr. [name] original intake note:  "Patient developed a left sided breast mass show up about 6 months prior to diagnosis. 2 months prior this began getting larger rapidly. She was seen in an urgent care where this mass was thought to be an abscess. Apparently there was an attempt to excise or drain this that was not successful and she was referred to Dr. [name] for evaluation. She underwent punch biopsy on 04/23/13 of her skin and the mass. She was then also sent for mm/us. The biopsy shows invasive high grade carcinoma that appears to be breast. It is ER 14%, PR negative, HER-2/neu negative with a Ki-67 that approaches 100%. She has also undergone mm with at least 10 cm upper inner left breast tumor with mild overyling skin thickening. There is also a 3x10 mm cluster of calcs near there also. There is indeterminate 7 mm level 1 left axillary node, she underwent a lymph node biopsy that was positive for disease. No mm evidence of right breast malignancy. An MRI of the breasts was done on 05/22/13 and showed a 14 cm mass in the left breast protruding from the skin and also a 2.5 cm level II axillary node."  Her subsequent history is as detailed below  INTERVAL HISTORY:   Erika [name] returns today for follow up of her breast cancer, with her finacee, Jermaine. Today is day 1 cycle 8 of Cytoxan, methotrexate, fluorouracil, which she receives every 21 days, with neulasta given on day 2 for granulocyte support.  REVIEW OF SYSTEMS:  Erika [name] continues to maintain her weight. She is eating better despite incidences of nausea. She avoids the zofran, as usually her herbal supplement eases the symptom by itself. She denies fevers, chills, or changes in bowel or bladder  habits. She continues on a stable dose of coumadin and denies bruising or bleeding. Her pain is 8/10 today because she did not want to take her meds and drive. Typically fentanyl patches and oxycodone PRN keep her pain down to 5-6/10. She continues on flexeril for back spasms, and gabapentin for her residual neuropathy. A detailed review of systems is otherwise stable.    PAST MEDICAL HISTORY: Past Medical History  Diagnosis Date  . Breast cancer     Invasive High Grade Carcinoma  . Headache(784.0)     Hx: of Migraines  . Status post chemotherapy     Adriamycin and Cytoxan  . Anxiety     ANXIOUS ABOUT HAVEING BIG SURGERY - MASTECTOMY  . 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 DVT (deep venous thrombosis)     RT ARM-PT ON LOVENOX  . Hx of iron deficiency anemia 11/26/13  . S/P radiation therapy 02-04-14 to 03-18-14                               1) Left Chest Wall / 50 Gy in 25 fractions/ 2) Left Supraclavicular fossa/ 46 Gy in 23 fractions / 3) Left Posterior Axillary boost / 8.533 Gy in 23 fractions/ 4) Left Chest Wall Scar boost / 10 Gy in 5 fractions    PAST SURGICAL HISTORY: Past Surgical History  Procedure Laterality Date  . Tubal ligation    .  Portacath placement Right 05/22/2013    Procedure: INSERTION PORT-A-CATH;  Surgeon: [name], MD;  Location: [facility];  Service: General;  Laterality: Right;  . Left breast needle core biopsy Left 05/21/13  . Left breast needle core  biopsy Left 06/22/13  . Mastectomy modified radical Left 11/15/2013    Procedure: LEFT MASTECTOMY MODIFIED RADICAL;  Surgeon: [name], MD;  Location: [facility];  Service: General;  Laterality: Left;    FAMILY HISTORY Family History  Problem Relation Age of Onset  . Hypertension Mother   . Diabetes Father 52  . Stomach cancer Maternal Aunt   . Hypertension Maternal Grandmother   . Hypertension Maternal Grandfather   . Diabetes Paternal Grandmother   . Diabetes Paternal Grandfather   . Diabetes  Paternal Uncle   . Diabetes Paternal Uncle   . Diabetes Paternal Uncle   The patient's mother is alive, age 53. The patient's father died from complications of diabetes at the age of 24. The patient has 2 brothers, one sister. There is no history of breast or ovarian cancer in the family to her knowledge .the patient has not undergone genetic testing   GYNECOLOGIC HISTORY:  Menarche age 56, first live birth age 36. The patient is GX P3. The patient has been on goserelin since 06/13/2013, given monthly  SOCIAL HISTORY:  The patient has worked at as a Furniture conservator/restorer and a Retail buyer. She is currently applying for disability. At home she lives with her youngest son, Erika [name], 56; with [name]; and with [name].   ADVANCED DIRECTIVES: Not in place  HEALTH MAINTENANCE: History  Substance Use Topics  . Smoking status: Never Smoker   . Smokeless tobacco: Never Used  . Alcohol Use: No     Mammogram: 06/22/2013 Colonoscopy: Bone Density Scan:  Pap Smear:  Eye Exam:  Vitamin D Level:   Lipid Panel:    Allergies  Allergen Reactions  . Doxycycline Nausea And Vomiting    Current Outpatient Prescriptions  Medication Sig Dispense Refill  . cyclobenzaprine (FLEXERIL) 5 MG tablet TAKE 1 TABLET BY MOUTH THREE TIMES A DAY FOR MUSCLE SPASMS 30 tablet 3  . gabapentin (NEURONTIN) 300 MG capsule Take 1 capsule (300 mg total) by mouth 3 (three) times daily. 90 capsule 4  . loratadine (CLARITIN) 10 MG tablet Take 10 mg by mouth daily as needed for allergies.    Marland Kitchen LORazepam (ATIVAN) 0.5 MG tablet Take 1 tablet (0.5 mg total) by mouth every 6 (six) hours as needed for anxiety. 30 tablet 3  . potassium chloride SA (K-DUR,KLOR-CON) 20 MEQ tablet Take 1 tablet (20 mEq total) by mouth 4 (four) times daily. 120 tablet 1  . warfarin (COUMADIN) 5 MG tablet Take 2.5-5 mg by mouth daily. Patient is getting regulated on Coumadin. Pt takes 2.36m daily .    .  fentaNYL (DURAGESIC - DOSED MCG/HR) 25 MCG/HR patch Place 1 patch (25 mcg total) onto the skin every 3 (three) days. 10 patch 0  . fentaNYL (DURAGESIC - DOSED MCG/HR) 75 MCG/HR Place 1 patch (75 mcg total) onto the skin every 3 (three) days. 10 patch 0  . ondansetron (ZOFRAN) 8 MG tablet Take by mouth every 8 (eight) hours as needed for nausea or vomiting.    .Marland KitchenoxyCODONE (OXY IR/ROXICODONE) 5 MG immediate release tablet Take 1 tablet (5 mg total) by mouth every 8 (eight) hours as needed for severe pain or breakthrough pain. 90 tablet 0  . traMADol (  ULTRAM) 50 MG tablet Take 1-2 tablets (50-100 mg total) by mouth every 6 (six) hours as needed. (Patient not taking: Reported on 04/15/2015) 30 tablet 1  . zolpidem (AMBIEN) 5 MG tablet Take 1 tablet (5 mg total) by mouth at bedtime as needed for sleep. (Patient not taking: Reported on 04/15/2015) 30 tablet 0   No current facility-administered medications for this visit.   Facility-Administered Medications Ordered in Other Visits  Medication Dose Route Frequency Provider Last Rate Last Dose  . topical emolient (BIAFINE) emulsion   Topical Daily [name], MD        OBJECTIVE: middle-aged African American woman who appears stated age 44 Vitals:   04/15/15 1129  BP: 101/70  Pulse: 105  Temp: 98.7 F (37.1 C)  Resp: 18     Body mass index is 28.59 kg/(m^2).      ECOG FS:1 - Symptomatic but completely ambulatory   Skin: warm, dry  HEENT: sclerae anicteric, conjunctivae pink, oropharynx clear. No thrush or mucositis.  Lymph Nodes: No cervical or supraclavicular lymphadenopathy  Lungs: clear to auscultation bilaterally, no rales, wheezes, or rhonci  Heart: regular rate and rhythm  Abdomen: round, soft, non tender, positive bowel sounds  Musculoskeletal: No focal spinal tenderness, no peripheral edema  Neuro: non focal, well oriented, positive affect  Breasts: deferred   LAB RESULTS:  CMP     Component Value Date/Time   NA 142  04/10/2015 1147   NA 137 02/24/2015 0624   K 3.6 04/10/2015 1147   K 4.1 02/24/2015 0624   CL 103 02/24/2015 0624   CL 102 06/01/2013 1500   CO2 25 04/10/2015 1147   CO2 26 02/24/2015 0624   GLUCOSE 89 04/10/2015 1147   GLUCOSE 94 02/24/2015 0624   GLUCOSE 95 06/01/2013 1500   BUN 8.6 04/10/2015 1147   BUN 7 02/24/2015 0624   CREATININE 0.8 04/10/2015 1147   CREATININE 0.67 02/24/2015 0624   CALCIUM 10.0 04/10/2015 1147   CALCIUM 9.2 02/24/2015 0624   PROT 7.8 04/10/2015 1147   PROT 8.1 02/21/2015 2100   ALBUMIN 2.9* 04/10/2015 1147   ALBUMIN 3.2* 02/21/2015 2100   AST 26 04/10/2015 1147   AST 31 02/21/2015 2100   ALT 34 04/10/2015 1147   ALT 20 02/21/2015 2100   ALKPHOS 167* 04/10/2015 1147   ALKPHOS 123* 02/21/2015 2100   BILITOT 0.27 04/10/2015 1147   BILITOT 0.6 02/21/2015 2100   GFRNONAA >90 02/24/2015 0624   GFRAA >90 02/24/2015 0624    I No results found for: SPEP  Lab Results  Component Value Date   WBC 8.2 04/15/2015   NEUTROABS 6.0 04/15/2015   HGB 10.5* 04/15/2015   HCT 32.8* 04/15/2015   MCV 88.9 04/15/2015   PLT 380 04/15/2015      Chemistry      Component Value Date/Time   NA 142 04/10/2015 1147   NA 137 02/24/2015 0624   K 3.6 04/10/2015 1147   K 4.1 02/24/2015 0624   CL 103 02/24/2015 0624   CL 102 06/01/2013 1500   CO2 25 04/10/2015 1147   CO2 26 02/24/2015 0624   BUN 8.6 04/10/2015 1147   BUN 7 02/24/2015 0624   CREATININE 0.8 04/10/2015 1147   CREATININE 0.67 02/24/2015 0624      Component Value Date/Time   CALCIUM 10.0 04/10/2015 1147   CALCIUM 9.2 02/24/2015 0624   ALKPHOS 167* 04/10/2015 1147   ALKPHOS 123* 02/21/2015 2100   AST 26 04/10/2015 1147   AST  31 02/21/2015 2100   ALT 34 04/10/2015 1147   ALT 20 02/21/2015 2100   BILITOT 0.27 04/10/2015 1147   BILITOT 0.6 02/21/2015 2100       Lab Results  Component Value Date   LABCA2 35 05/18/2013    No components found for: LABCA125   Recent Labs Lab  04/15/15 1113  INR 2.50    Urinalysis    Component Value Date/Time   COLORURINE YELLOW 02/24/2015 1632    STUDIES: No results found.  ASSESSMENT: 44 y.o.  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  (0) genetic testing of the ATM, BARD1, BRCA1, BRCA2, BRIP1, CDH1, CHEK2, EPCAM, FANCC, MLH1, MSH2, MSH6, NBN, PALB2, PMS2, PTEN, RAD51C, RAD51D, STK11, TP53, and XRCC2 genes showed no deleterious mutations  (1) status post left breast biopsy 04/23/2013 for a clinical T4 N1, stage IIIB invasive ductal carcinoma, grade 3,  estrogen receptor 14% positive with moderate staining intensity, progesterone receptor negative,  with an MIB-1 of  100% and HER-2/neu  nonamplified  (2) biopsy of a left axillary lymph node [phone] was positive  (3) started monthly goserelin July of 02/01/2013  (4)  received  neoadjuvant chemotherapy with doxorubicin and cyclophosphamide in dose dense fashion x4, followed by carboplatin and paclitaxel x1, discontinued due to neuropathy, followed by carboplatin and gemcitabine, both given day 1 of each 14 day cycle x6, completed 10/12/2013 .   (5) right upper extremity DVT documented 06/29/2013,  treated with Lovenox from  July  through  December 2014    (6) status post left modified radical mastectomy 11/15/2013 for a residual pT3 pN0 invasive ductal carcinoma, grade 3, with repeat prognostic panel triple negative. All 12 axillary lymph nodes were clear. Margins were negative   (5) completed adjuvant radiation therapy 03/18/2014,  with capecitabine added 2-3 weeks into treatment.  METASTATIC DISEASE  (6) CT scans of the chest, abdomen, and pelvis 06/26/2014 showed a large right pleural effusion  with pleural studding, 2 left upper lobe nodules, and two  liver lesions. Cytology from right thoracentesis 07/03/2014 showed malignant cells consistent with the patient's known primary  (7) started eribulin 07/09/2014, given days 1 and 8 of  each 21 day cycle; restagind studies after cycle 3 showed evidence of response. Discontinued after cycle 5 (last dose 10/08/14) because of worsening peripheral neuropathy--also with progression.   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 Antithrombin level was normal.  PLAN:  Erika [name] continues to tolerate treatment well. The labs were reviewed in detail and were entirely stable. She will proceed with cycle 8 of cyclophosphamide, methotrexate, and fluorouracil as planned today. She will return on tomorrow for her neulasta injection.  Today I have refilled her oxycodone, fentanyl patches, flexeril, and gabapentin as requested.  Erika [name] will return in 3 weeks for labs, and a follow up visit. Prior to this visit, she will have repeat staging scans. Tentatively I have scheduled cycle 9 of treatment, but this will obviously be determined by the nature of her scan. She understands and agrees with this plan. She knows the goal of treatment in her case is control. She has  been encouraged to call with any issues that might arise before her next visit here.   [name], NP 04/15/2015 11:52 AM

## 2015-04-16 ENCOUNTER — Ambulatory Visit (HOSPITAL_BASED_OUTPATIENT_CLINIC_OR_DEPARTMENT_OTHER): Payer: No Typology Code available for payment source

## 2015-04-16 VITALS — BP 107/74 | HR 102 | Temp 97.9°F | Resp 18

## 2015-04-16 DIAGNOSIS — C50212 Malignant neoplasm of upper-inner quadrant of left female breast: Secondary | ICD-10-CM

## 2015-04-16 DIAGNOSIS — Z5189 Encounter for other specified aftercare: Secondary | ICD-10-CM | POA: Diagnosis not present

## 2015-04-16 DIAGNOSIS — G893 Neoplasm related pain (acute) (chronic): Secondary | ICD-10-CM

## 2015-04-16 DIAGNOSIS — E876 Hypokalemia: Secondary | ICD-10-CM

## 2015-04-16 MED ORDER — PEGFILGRASTIM INJECTION 6 MG/0.6ML ~~LOC~~
6.0000 mg | PREFILLED_SYRINGE | Freq: Once | SUBCUTANEOUS | Status: AC
  Administered 2015-04-16: 6 mg via SUBCUTANEOUS
  Filled 2015-04-16: qty 0.6

## 2015-04-16 NOTE — Patient Instructions (Signed)
Pegfilgrastim injection What is this medicine? PEGFILGRASTIM (peg fil GRA stim) is a long-acting granulocyte colony-stimulating factor that stimulates the growth of neutrophils, a type of white blood cell important in the body's fight against infection. It is used to reduce the incidence of fever and infection in patients with certain types of cancer who are receiving chemotherapy that affects the bone marrow. This medicine may be used for other purposes; ask your health care provider or pharmacist if you have questions. COMMON BRAND NAME(S): Neulasta What should I tell my health care provider before I take this medicine? They need to know if you have any of these conditions: -latex allergy -ongoing radiation therapy -sickle cell disease -skin reactions to acrylic adhesives (On-Body Injector only) -an unusual or allergic reaction to pegfilgrastim, filgrastim, other medicines, foods, dyes, or preservatives -pregnant or trying to get pregnant -breast-feeding How should I use this medicine? This medicine is for injection under the skin. If you get this medicine at home, you will be taught how to prepare and give the pre-filled syringe or how to use the On-body Injector. Refer to the patient Instructions for Use for detailed instructions. Use exactly as directed. Take your medicine at regular intervals. Do not take your medicine more often than directed. It is important that you put your used needles and syringes in a special sharps container. Do not put them in a trash can. If you do not have a sharps container, call your pharmacist or healthcare provider to get one. Talk to your pediatrician regarding the use of this medicine in children. Special care may be needed. Overdosage: If you think you have taken too much of this medicine contact a poison control center or emergency room at once. NOTE: This medicine is only for you. Do not share this medicine with others. What if I miss a dose? It is  important not to miss your dose. Call your doctor or health care professional if you miss your dose. If you miss a dose due to an On-body Injector failure or leakage, a new dose should be administered as soon as possible using a single prefilled syringe for manual use. What may interact with this medicine? Interactions have not been studied. Give your health care provider a list of all the medicines, herbs, non-prescription drugs, or dietary supplements you use. Also tell them if you smoke, drink alcohol, or use illegal drugs. Some items may interact with your medicine. This list may not describe all possible interactions. Give your health care provider a list of all the medicines, herbs, non-prescription drugs, or dietary supplements you use. Also tell them if you smoke, drink alcohol, or use illegal drugs. Some items may interact with your medicine. What should I watch for while using this medicine? You may need blood work done while you are taking this medicine. If you are going to need a MRI, CT scan, or other procedure, tell your doctor that you are using this medicine (On-Body Injector only). What side effects may I notice from receiving this medicine? Side effects that you should report to your doctor or health care professional as soon as possible: -allergic reactions like skin rash, itching or hives, swelling of the face, lips, or tongue -dizziness -fever -pain, redness, or irritation at site where injected -pinpoint red spots on the skin -shortness of breath or breathing problems -stomach or side pain, or pain at the shoulder -swelling -tiredness -trouble passing urine Side effects that usually do not require medical attention (report to your doctor   or health care professional if they continue or are bothersome): -bone pain -muscle pain This list may not describe all possible side effects. Call your doctor for medical advice about side effects. You may report side effects to FDA at  1-800-FDA-1088. Where should I keep my medicine? Keep out of the reach of children. Store pre-filled syringes in a refrigerator between 2 and 8 degrees C (36 and 46 degrees F). Do not freeze. Keep in carton to protect from light. Throw away this medicine if it is left out of the refrigerator for more than 48 hours. Throw away any unused medicine after the expiration date. NOTE: This sheet is a summary. It may not cover all possible information. If you have questions about this medicine, talk to your doctor, pharmacist, or health care provider.  2015, Elsevier/Gold Standard. (2014-02-28 16:14:05)  

## 2015-04-24 ENCOUNTER — Telehealth: Payer: Self-pay | Admitting: *Deleted

## 2015-04-24 ENCOUNTER — Ambulatory Visit (HOSPITAL_BASED_OUTPATIENT_CLINIC_OR_DEPARTMENT_OTHER): Payer: No Typology Code available for payment source | Admitting: Nurse Practitioner

## 2015-04-24 ENCOUNTER — Ambulatory Visit (HOSPITAL_BASED_OUTPATIENT_CLINIC_OR_DEPARTMENT_OTHER): Payer: No Typology Code available for payment source

## 2015-04-24 ENCOUNTER — Encounter: Payer: Self-pay | Admitting: Nurse Practitioner

## 2015-04-24 VITALS — BP 113/81 | HR 118 | Temp 97.8°F | Resp 18 | Wt 174.2 lb

## 2015-04-24 DIAGNOSIS — Z86718 Personal history of other venous thrombosis and embolism: Secondary | ICD-10-CM

## 2015-04-24 DIAGNOSIS — H109 Unspecified conjunctivitis: Secondary | ICD-10-CM

## 2015-04-24 DIAGNOSIS — J329 Chronic sinusitis, unspecified: Secondary | ICD-10-CM | POA: Insufficient documentation

## 2015-04-24 DIAGNOSIS — C50212 Malignant neoplasm of upper-inner quadrant of left female breast: Secondary | ICD-10-CM

## 2015-04-24 DIAGNOSIS — C773 Secondary and unspecified malignant neoplasm of axilla and upper limb lymph nodes: Secondary | ICD-10-CM

## 2015-04-24 DIAGNOSIS — Z7901 Long term (current) use of anticoagulants: Secondary | ICD-10-CM

## 2015-04-24 DIAGNOSIS — T451X5A Adverse effect of antineoplastic and immunosuppressive drugs, initial encounter: Principal | ICD-10-CM

## 2015-04-24 DIAGNOSIS — D701 Agranulocytosis secondary to cancer chemotherapy: Secondary | ICD-10-CM | POA: Diagnosis not present

## 2015-04-24 DIAGNOSIS — J0110 Acute frontal sinusitis, unspecified: Secondary | ICD-10-CM

## 2015-04-24 LAB — CBC WITH DIFFERENTIAL/PLATELET
BASO%: 0.9 % (ref 0.0–2.0)
BASOS ABS: 0 10*3/uL (ref 0.0–0.1)
EOS%: 4 % (ref 0.0–7.0)
Eosinophils Absolute: 0 10*3/uL (ref 0.0–0.5)
HCT: 28.9 % — ABNORMAL LOW (ref 34.8–46.6)
HGB: 9.5 g/dL — ABNORMAL LOW (ref 11.6–15.9)
LYMPH%: 66.2 % — ABNORMAL HIGH (ref 14.0–49.7)
MCH: 28.8 pg (ref 25.1–34.0)
MCHC: 32.8 g/dL (ref 31.5–36.0)
MCV: 87.9 fL (ref 79.5–101.0)
MONO#: 0.1 10*3/uL (ref 0.1–0.9)
MONO%: 14.8 % — AB (ref 0.0–14.0)
NEUT#: 0.1 10*3/uL — CL (ref 1.5–6.5)
NEUT%: 14.1 % — ABNORMAL LOW (ref 38.4–76.8)
Platelets: 200 10*3/uL (ref 145–400)
RBC: 3.29 10*6/uL — ABNORMAL LOW (ref 3.70–5.45)
RDW: 20 % — AB (ref 11.2–14.5)
WBC: 0.8 10*3/uL — CL (ref 3.9–10.3)
lymph#: 0.5 10*3/uL — ABNORMAL LOW (ref 0.9–3.3)

## 2015-04-24 MED ORDER — AZITHROMYCIN 250 MG PO TABS: ORAL_TABLET | ORAL | Status: DC

## 2015-04-24 MED ORDER — ERYTHROMYCIN 5 MG/GM OP OINT: 1.0000 "application " | TOPICAL_OINTMENT | Freq: Four times a day (QID) | OPHTHALMIC | Status: DC

## 2015-04-24 NOTE — Assessment & Plan Note (Signed)
Patient has a history of DVT; and continues on Coumadin as managed per the Coumadin clinic.  Patient will be prescribed antibiotics.  Today for mild sinusitis and neutropenia; and was advised that antibiotics may very well interfere with her Coumadin/INR level.  Patient was advised to follow-up closely with her Coumadin clinic.

## 2015-04-24 NOTE — Telephone Encounter (Signed)
TC from patient stating that for the past 2 days she has woken up with drainage from both eyes, sticking her eyelashes together, sclera pin and eyes generally uncomfortable. Also with 'runny nose with drainage changing from clear to green'. Concerned she might have pink eye or some other infection.  Had chemo on 04/15/15 with cytoxan, methtrexate and 5FU; neulasta day after chemo. Set her up for [facility] @ 2pm with CBC @ 1:45p

## 2015-04-24 NOTE — Assessment & Plan Note (Signed)
Patient complaining of bilateral eye redness, purulent discharge, in light sensitivity for the past 2 days.  Denies any vision changes.  On exam.-Patient with increased erythema of bilateral sclera and dried discharge to both eyes.  Will prescribe erythromycin ophthalmic ointment for treatment of bilateral conjunctivitis.  Advised patient to call/return or go directly to the emergency for any worsening symptoms whatsoever.  Also, patient was advised that she may very well need to follow-up with an ophthalmologist if any worsening symptoms whatsoever.

## 2015-04-24 NOTE — Progress Notes (Signed)
SYMPTOM MANAGEMENT CLINIC   HPI: Erika [name] 44 y.o. female diagnosed with breast cancer.  Currently undergoing CMF chemotherapy regimen.  Patient complaining of bilateral redness to eyes, purulent discharge, and light sensitivity for the past 2 days.  She is also complaining of some mild nasal congestion and occasional yellow-green discharge.  She denies any sore throat or cough.  She denies any recent fevers or chills.  HPI  ROS  Past Medical History  Diagnosis Date  . Breast cancer     Invasive High Grade Carcinoma  . Headache(784.0)     Hx: of Migraines  . Status post chemotherapy     Adriamycin and Cytoxan  . Anxiety     ANXIOUS ABOUT HAVEING BIG SURGERY - MASTECTOMY  . 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 DVT (deep venous thrombosis)     RT ARM-PT ON LOVENOX  . Hx of iron deficiency anemia 11/26/13  . S/P radiation therapy 02-04-14 to 03-18-14                               1) Left Chest Wall / 50 Gy in 25 fractions/ 2) Left Supraclavicular fossa/ 46 Gy in 23 fractions / 3) Left Posterior Axillary boost / 8.533 Gy in 23 fractions/ 4) Left Chest Wall Scar boost / 10 Gy in 5 fractions    Past Surgical History  Procedure Laterality Date  . Tubal ligation    . Portacath placement Right 05/22/2013    Procedure: INSERTION PORT-A-CATH;  Surgeon: [name], MD;  Location: [facility];  Service: General;  Laterality: Right;  . Left breast needle core biopsy Left 05/21/13  . Left breast needle core  biopsy Left 06/22/13  . Mastectomy modified radical Left 11/15/2013    Procedure: LEFT MASTECTOMY MODIFIED RADICAL;  Surgeon: [name], MD;  Location: [facility];  Service: General;  Laterality: Left;    has DVT (deep venous thrombosis); S/P mastectomy; Breast cancer of upper-inner quadrant of left female breast; Hypokalemia; Neuropathy due to chemotherapeutic drug; Neoplasm related pain; Hand foot syndrome; Anemia in neoplastic disease; Thrombocytosis; Cancer associated  pain; URI (upper respiratory infection); Long term current use of anticoagulant therapy; Renal vein thrombosis; Hypotension; Sepsis; UTI (lower urinary tract infection); Hematuria; Supratherapeutic INR; Pancytopenia due to antineoplastic chemotherapy; Malnutrition of moderate degree; Thrombosis of renal vein; Chemotherapy induced neutropenia; Sinusitis; and Conjunctivitis on her problem list.    is allergic to doxycycline.    Medication List       This list is accurate as of: 04/24/15  3:38 PM.  Always use your most recent med list.               azithromycin 250 MG tablet  Commonly known as:  ZITHROMAX Z-PAK  Take 2 tabs (500 mg) PO on day # 1; then take 1 tab (250 mg) PO QD till gone.     cyclobenzaprine 5 MG tablet  Commonly known as:  FLEXERIL  TAKE 1 TABLET BY MOUTH THREE TIMES A DAY FOR MUSCLE SPASMS     erythromycin ophthalmic ointment  Commonly known as:  ROMYCIN  Place 1 application into both eyes 4 (four) times daily.     fentaNYL 25 MCG/HR patch  Commonly known as:  DURAGESIC - dosed mcg/hr  Place 1 patch (25 mcg total) onto the skin every 3 (three) days.     fentaNYL 75 MCG/HR  Commonly known as:  DURAGESIC -  dosed mcg/hr  Place 1 patch (75 mcg total) onto the skin every 3 (three) days.     gabapentin 300 MG capsule  Commonly known as:  NEURONTIN  Take 1 capsule (300 mg total) by mouth 3 (three) times daily.     loratadine 10 MG tablet  Commonly known as:  CLARITIN  Take 10 mg by mouth daily as needed for allergies.     LORazepam 0.5 MG tablet  Commonly known as:  ATIVAN  Take 1 tablet (0.5 mg total) by mouth every 6 (six) hours as needed for anxiety.     ondansetron 8 MG tablet  Commonly known as:  ZOFRAN  Take by mouth every 8 (eight) hours as needed for nausea or vomiting.     oxyCODONE 5 MG immediate release tablet  Commonly known as:  Oxy IR/ROXICODONE  Take 1 tablet (5 mg total) by mouth every 8 (eight) hours as needed for severe pain or  breakthrough pain.     potassium chloride SA 20 MEQ tablet  Commonly known as:  K-DUR,KLOR-CON  Take 1 tablet (20 mEq total) by mouth 4 (four) times daily.     traMADol 50 MG tablet  Commonly known as:  ULTRAM  Take 1-2 tablets (50-100 mg total) by mouth every 6 (six) hours as needed.     warfarin 5 MG tablet  Commonly known as:  COUMADIN  Take 2.5-5 mg by mouth daily. Patient is getting regulated on Coumadin. Pt takes 2.5mg  daily .     zolpidem 5 MG tablet  Commonly known as:  AMBIEN  Take 1 tablet (5 mg total) by mouth at bedtime as needed for sleep.         PHYSICAL EXAMINATION  Oncology Vitals 04/24/2015 04/16/2015 04/15/2015 03/27/2015 03/25/2015 03/05/2015 03/04/2015  Height - - 166 cm - 166 cm - 166 cm  Weight 79.017 kg - 79.153 kg - 79.561 kg - 80.876 kg  Weight (lbs) 174 lbs 3 oz - 174 lbs 8 oz - 175 lbs 6 oz - 178 lbs 5 oz  BMI (kg/m2) - - 28.6 kg/m2 - 28.74 kg/m2 - 29.22 kg/m2  Temp 97.8 97.9 98.7 98.8 97.9 97.8 97.6  Pulse 118 102 105 106 113 90 102  Resp 18 18 18  - 18 - 18  SpO2 100 100 - - - - -  BSA (m2) - - 1.91 m2 - 1.92 m2 - 1.93 m2   BP Readings from Last 3 Encounters:  04/24/15 113/81  04/16/15 107/74  04/15/15 101/70    Physical Exam  Constitutional: She is oriented to person, place, and time and well-developed, well-nourished, and in no distress.  HENT:  Head: Normocephalic and atraumatic.  Mouth/Throat: Oropharynx is clear and moist.  Posterior oropharynx clear; no exudate.  Mild nasal congestion; but no tenderness to face with palpation.  Eyes: Conjunctivae and EOM are normal. Pupils are equal, round, and reactive to light. Right eye exhibits discharge. Left eye exhibits discharge. No scleral icterus.  Erythema noted to bilateral sclera.  Dried purulent discharge to eyes as well.   Neck: Normal range of motion. Neck supple. No JVD present. No tracheal deviation present. No thyromegaly present.  Cardiovascular: Normal rate, regular rhythm, normal heart  sounds and intact distal pulses.   Pulmonary/Chest: Effort normal and breath sounds normal. No stridor. No respiratory distress. She has no wheezes. She has no rales. She exhibits no tenderness.  Abdominal: Soft. Bowel sounds are normal. She exhibits no distension and no mass. There is no  tenderness. There is no rebound and no guarding.  Musculoskeletal: Normal range of motion. She exhibits no edema or tenderness.  Lymphadenopathy:    She has no cervical adenopathy.  Neurological: She is alert and oriented to person, place, and time. Gait normal.  Skin: Skin is warm and dry. No rash noted. No erythema. No pallor.  Psychiatric: Affect normal.  Nursing note and vitals reviewed.   LABORATORY DATA:. Appointment on 04/24/2015  Component Date Value Ref Range Status  . WBC 04/24/2015 0.8* 3.9 - 10.3 10e3/uL Final  . NEUT# 04/24/2015 0.1* 1.5 - 6.5 10e3/uL Final  . HGB 04/24/2015 9.5* 11.6 - 15.9 g/dL Final  . HCT 04/24/2015 28.9* 34.8 - 46.6 % Final  . Platelets 04/24/2015 200  145 - 400 10e3/uL Final  . MCV 04/24/2015 87.9  79.5 - 101.0 fL Final  . MCH 04/24/2015 28.8  25.1 - 34.0 pg Final  . MCHC 04/24/2015 32.8  31.5 - 36.0 g/dL Final  . RBC 04/24/2015 3.29* 3.70 - 5.45 10e6/uL Final  . RDW 04/24/2015 20.0* 11.2 - 14.5 % Final  . lymph# 04/24/2015 0.5* 0.9 - 3.3 10e3/uL Final  . MONO# 04/24/2015 0.1  0.1 - 0.9 10e3/uL Final  . Eosinophils Absolute 04/24/2015 0.0  0.0 - 0.5 10e3/uL Final  . Basophils Absolute 04/24/2015 0.0  0.0 - 0.1 10e3/uL Final  . NEUT% 04/24/2015 14.1* 38.4 - 76.8 % Final  . LYMPH% 04/24/2015 66.2* 14.0 - 49.7 % Final  . MONO% 04/24/2015 14.8* 0.0 - 14.0 % Final  . EOS% 04/24/2015 4.0  0.0 - 7.0 % Final  . BASO% 04/24/2015 0.9  0.0 - 2.0 % Final     RADIOGRAPHIC STUDIES: No results found.  ASSESSMENT/PLAN:    Breast cancer of upper-inner quadrant of left female breast Patient received her last cycle of CMF chemotherapy on 04/15/2015.  She is scheduled for  restaging CT on 05/01/2015.  She is scheduled for labs, follow up visit, and her next cycle of chemotherapy on 05/05/2015.   Long term current use of anticoagulant therapy Patient has a history of DVT; and continues on Coumadin as managed per the Coumadin clinic.  Patient will be prescribed antibiotics.  Today for mild sinusitis and neutropenia; and was advised that antibiotics may very well interfere with her Coumadin/INR level.  Patient was advised to follow-up closely with her Coumadin clinic.   Chemotherapy induced neutropenia WBC, 0.8 and ANC is 0.1 today.  Patient is afebrile at the [facility] today.  However, patient does have bilateral conjunctivitis; as well as some mild sinusitis symptoms today.  Reviewed all neutropenia guidelines with the patient and her husband today.  Will prescribe erythromycin ophthalmic ointment for her conjunctivitis; and will prescribe Zithromax for mild sinusitis symptoms.  Patient was advised to call/return to go directly to the emergency for any worsening symptoms whatsoever.   Sinusitis Patient complaining of some nasal congestion; and occasional yellow/green discharge.  She denies any specific coughing.  She denies any sore throat.  On exam.-Patient with mild nasal congestion.  No facial tenderness with palpation.  Given patient's neutropenic status-will prescribe Zithromax for mild sinusitis symptoms.   Conjunctivitis Patient complaining of bilateral eye redness, purulent discharge, in light sensitivity for the past 2 days.  Denies any vision changes.  On exam.-Patient with increased erythema of bilateral sclera and dried discharge to both eyes.  Will prescribe erythromycin ophthalmic ointment for treatment of bilateral conjunctivitis.  Advised patient to call/return or go directly to the emergency for any worsening  symptoms whatsoever.  Also, patient was advised that she may very well need to follow-up with an ophthalmologist if any  worsening symptoms whatsoever.   Patient stated understanding of all instructions; and was in agreement with this plan of care. The patient knows to call the clinic with any problems, questions or concerns.   Review/collaboration with Dr. Jana Hakim regarding all aspects of patient's visit today.   Total time spent with patient was 25 minutes;  with greater than 75 percent of that time spent in face to face counseling regarding patient's symptoms,  and coordination of care and follow up.  Disclaimer: This note was dictated with voice recognition software. Similar sounding words can inadvertently be transcribed and may not be corrected upon review.   Drue Second, NP 04/24/2015

## 2015-04-24 NOTE — Assessment & Plan Note (Signed)
Patient received her last cycle of CMF chemotherapy on 04/15/2015.  She is scheduled for restaging CT on 05/01/2015.  She is scheduled for labs, follow up visit, and her next cycle of chemotherapy on 05/05/2015.

## 2015-04-24 NOTE — Assessment & Plan Note (Signed)
Patient complaining of some nasal congestion; and occasional yellow/green discharge.  She denies any specific coughing.  She denies any sore throat.  On exam.-Patient with mild nasal congestion.  No facial tenderness with palpation.  Given patient's neutropenic status-will prescribe Zithromax for mild sinusitis symptoms.

## 2015-04-24 NOTE — Assessment & Plan Note (Signed)
WBC, 0.8 and ANC is 0.1 today.  Patient is afebrile at the [facility] today.  However, patient does have bilateral conjunctivitis; as well as some mild sinusitis symptoms today.  Reviewed all neutropenia guidelines with the patient and her husband today.  Will prescribe erythromycin ophthalmic ointment for her conjunctivitis; and will prescribe Zithromax for mild sinusitis symptoms.  Patient was advised to call/return to go directly to the emergency for any worsening symptoms whatsoever.

## 2015-04-25 ENCOUNTER — Telehealth: Payer: Self-pay

## 2015-04-25 NOTE — Telephone Encounter (Signed)
z-pack was picked up but pharmacy did not have eye drops, she will pick up eye drops today. She can feel a small difference in her sinusitis already. She is getting a sore throat, encouraged salt water/baking soda gargles.

## 2015-05-01 ENCOUNTER — Ambulatory Visit (HOSPITAL_COMMUNITY)
Admission: RE | Admit: 2015-05-01 | Discharge: 2015-05-01 | Disposition: A | Discharge status: Home / Self Care | Payer: No Typology Code available for payment source | Source: Ambulatory Visit | Attending: Oncology | Admitting: Oncology

## 2015-05-01 DIAGNOSIS — I823 Embolism and thrombosis of renal vein: Secondary | ICD-10-CM | POA: Insufficient documentation

## 2015-05-01 DIAGNOSIS — I82409 Acute embolism and thrombosis of unspecified deep veins of unspecified lower extremity: Secondary | ICD-10-CM

## 2015-05-01 DIAGNOSIS — Z79899 Other long term (current) drug therapy: Secondary | ICD-10-CM | POA: Insufficient documentation

## 2015-05-01 DIAGNOSIS — R918 Other nonspecific abnormal finding of lung field: Secondary | ICD-10-CM | POA: Insufficient documentation

## 2015-05-01 DIAGNOSIS — C50212 Malignant neoplasm of upper-inner quadrant of left female breast: Secondary | ICD-10-CM | POA: Diagnosis not present

## 2015-05-01 DIAGNOSIS — Z923 Personal history of irradiation: Secondary | ICD-10-CM | POA: Diagnosis not present

## 2015-05-01 DIAGNOSIS — R590 Localized enlarged lymph nodes: Secondary | ICD-10-CM | POA: Insufficient documentation

## 2015-05-01 DIAGNOSIS — C787 Secondary malignant neoplasm of liver and intrahepatic bile duct: Secondary | ICD-10-CM | POA: Insufficient documentation

## 2015-05-01 DIAGNOSIS — Z9012 Acquired absence of left breast and nipple: Secondary | ICD-10-CM | POA: Insufficient documentation

## 2015-05-01 DIAGNOSIS — D630 Anemia in neoplastic disease: Secondary | ICD-10-CM

## 2015-05-01 DIAGNOSIS — K8020 Calculus of gallbladder without cholecystitis without obstruction: Secondary | ICD-10-CM | POA: Insufficient documentation

## 2015-05-01 DIAGNOSIS — M8538 Osteitis condensans, other site: Secondary | ICD-10-CM | POA: Diagnosis present

## 2015-05-01 MED ORDER — IOHEXOL 300 MG/ML  SOLN
100.0000 mL | Freq: Once | INTRAMUSCULAR | Status: AC | PRN
Start: 2015-05-01 — End: 2015-05-01
  Administered 2015-05-01: 100 mL via INTRAVENOUS

## 2015-05-05 ENCOUNTER — Telehealth: Payer: Self-pay | Admitting: Oncology

## 2015-05-05 ENCOUNTER — Ambulatory Visit (HOSPITAL_BASED_OUTPATIENT_CLINIC_OR_DEPARTMENT_OTHER): Payer: No Typology Code available for payment source | Admitting: Pharmacist

## 2015-05-05 ENCOUNTER — Other Ambulatory Visit (HOSPITAL_BASED_OUTPATIENT_CLINIC_OR_DEPARTMENT_OTHER): Payer: No Typology Code available for payment source

## 2015-05-05 ENCOUNTER — Ambulatory Visit: Payer: No Typology Code available for payment source

## 2015-05-05 ENCOUNTER — Ambulatory Visit (HOSPITAL_BASED_OUTPATIENT_CLINIC_OR_DEPARTMENT_OTHER): Payer: No Typology Code available for payment source | Admitting: Oncology

## 2015-05-05 VITALS — BP 111/72 | HR 105 | Temp 98.1°F | Resp 18 | Ht 65.5 in | Wt 176.2 lb

## 2015-05-05 DIAGNOSIS — G620 Drug-induced polyneuropathy: Secondary | ICD-10-CM | POA: Diagnosis not present

## 2015-05-05 DIAGNOSIS — C50212 Malignant neoplasm of upper-inner quadrant of left female breast: Secondary | ICD-10-CM | POA: Diagnosis not present

## 2015-05-05 DIAGNOSIS — T451X5A Adverse effect of antineoplastic and immunosuppressive drugs, initial encounter: Secondary | ICD-10-CM

## 2015-05-05 DIAGNOSIS — D6481 Anemia due to antineoplastic chemotherapy: Secondary | ICD-10-CM

## 2015-05-05 DIAGNOSIS — I82409 Acute embolism and thrombosis of unspecified deep veins of unspecified lower extremity: Secondary | ICD-10-CM

## 2015-05-05 DIAGNOSIS — Z86718 Personal history of other venous thrombosis and embolism: Secondary | ICD-10-CM | POA: Diagnosis not present

## 2015-05-05 DIAGNOSIS — I823 Embolism and thrombosis of renal vein: Secondary | ICD-10-CM | POA: Diagnosis not present

## 2015-05-05 DIAGNOSIS — C50912 Malignant neoplasm of unspecified site of left female breast: Secondary | ICD-10-CM

## 2015-05-05 LAB — CBC WITH DIFFERENTIAL/PLATELET
BASO%: 0.5 % (ref 0.0–2.0)
Basophils Absolute: 0 10*3/uL (ref 0.0–0.1)
EOS%: 1.9 % (ref 0.0–7.0)
Eosinophils Absolute: 0.1 10*3/uL (ref 0.0–0.5)
HEMATOCRIT: 29.6 % — AB (ref 34.8–46.6)
HEMOGLOBIN: 9.7 g/dL — AB (ref 11.6–15.9)
LYMPH%: 12 % — ABNORMAL LOW (ref 14.0–49.7)
MCH: 28.4 pg (ref 25.1–34.0)
MCHC: 32.7 g/dL (ref 31.5–36.0)
MCV: 87 fL (ref 79.5–101.0)
MONO#: 0.8 10*3/uL (ref 0.1–0.9)
MONO%: 10.5 % (ref 0.0–14.0)
NEUT#: 5.7 10*3/uL (ref 1.5–6.5)
NEUT%: 75.1 % (ref 38.4–76.8)
Platelets: 443 10*3/uL — ABNORMAL HIGH (ref 145–400)
RBC: 3.4 10*6/uL — ABNORMAL LOW (ref 3.70–5.45)
RDW: 20.4 % — ABNORMAL HIGH (ref 11.2–14.5)
WBC: 7.6 10*3/uL (ref 3.9–10.3)
lymph#: 0.9 10*3/uL (ref 0.9–3.3)

## 2015-05-05 LAB — COMPREHENSIVE METABOLIC PANEL (CC13)
ALBUMIN: 2.4 g/dL — AB (ref 3.5–5.0)
ALK PHOS: 124 U/L (ref 40–150)
ALT: 12 U/L (ref 0–55)
AST: 32 U/L (ref 5–34)
Anion Gap: 13 mEq/L — ABNORMAL HIGH (ref 3–11)
BUN: 5.3 mg/dL — ABNORMAL LOW (ref 7.0–26.0)
CO2: 27 mEq/L (ref 22–29)
Calcium: 9.2 mg/dL (ref 8.4–10.4)
Chloride: 101 mEq/L (ref 98–109)
Creatinine: 0.7 mg/dL (ref 0.6–1.1)
Glucose: 84 mg/dl (ref 70–140)
POTASSIUM: 3.2 meq/L — AB (ref 3.5–5.1)
Sodium: 140 mEq/L (ref 136–145)
TOTAL PROTEIN: 7.9 g/dL (ref 6.4–8.3)
Total Bilirubin: 0.34 mg/dL (ref 0.20–1.20)

## 2015-05-05 LAB — PROTIME-INR
INR: 4.9 — AB (ref 2.00–3.50)
PROTIME: 58.8 s — AB (ref 10.6–13.4)

## 2015-05-05 LAB — POCT INR: INR: 4.9

## 2015-05-05 NOTE — Patient Instructions (Signed)
Hold dose for today and tomorrow.   Then start taking coumadin 2mg  daily We will see you during your infusion on 05/15/15

## 2015-05-05 NOTE — Telephone Encounter (Signed)
Appointments made and avs printed for patient,email to [facility] for precert   anne

## 2015-05-05 NOTE — Progress Notes (Signed)
ID: Erika [name] OB: 11/22/1971  MR#: [id number]  CSN#:[id number]  PCP: Erika Cruel, MD GYN:   SU: Erika [name] OTHER MD:  CHIEF COMPLAINT:  metastatic breast cancer  CURRENT TREATMENT: CMF,  Coumadin  BREAST CANCER HISTORY: From Erika [name] original intake note:  "Patient developed a left sided breast mass show up about 6 months prior to diagnosis. 2 months prior this began getting larger rapidly. She was seen in an urgent care where this mass was thought to be an abscess. Apparently there was an attempt to excise or drain this that was not successful and she was referred to Erika [name] for evaluation. She underwent punch biopsy on 04/23/13 of her skin and the mass. She was then also sent for mm/us. The biopsy shows invasive high grade carcinoma that appears to be breast. It is ER 14%, PR negative, HER-2/neu negative with a Ki-67 that approaches 100%. She has also undergone mm with at least 10 cm upper inner left breast tumor with mild overyling skin thickening. There is also a 3x10 mm cluster of calcs near there also. There is indeterminate 7 mm level 1 left axillary node, she underwent a lymph node biopsy that was positive for disease. No mm evidence of right breast malignancy. An MRI of the breasts was done on 05/22/13 and showed a 14 cm mass in the left breast protruding from the skin and also a 2.5 cm level II axillary node."  Her subsequent history is as detailed below  INTERVAL HISTORY:   Erika [name] returns today for follow up of her breast cancer, with her finacee, Erika [name]. Today would be day 1 cycle 9 of Cytoxan, methotrexate, fluorouracil. However, we just obtain a restaging CT scan of the chest, which shows disease progression. Accordingly we are discontinuing the CMF treatments. She is here to discuss alternatives  REVIEW OF SYSTEMS:  Erika [name] is doing remarkably well despite the disease progression. She has been walking about 2 miles a day. She does a little bit of  housework at times. She does sleep for 2 or 3 hours during the day, then goes to bed about 10:30 at night, wakes up at 3, goes back to bed at 5, then sleeps until 6 7:30 in the morning. She has pain on her right side, but she prefers to pray about it and to take medication. When she does take pain pills they do not constipate her. She has no urine problems. She has a rare cough, which is dry. She denies pleurisy. She continues to significant numbness in her fingers and toes. Her sense of taste is "not good". A detailed review of systems otherwise was stable  PAST MEDICAL HISTORY: Past Medical History  Diagnosis Date  . Breast cancer     Invasive High Grade Carcinoma  . Headache(784.0)     Hx: of Migraines  . Status post chemotherapy     Adriamycin and Cytoxan  . Anxiety     ANXIOUS ABOUT HAVEING BIG SURGERY - MASTECTOMY  . 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 DVT (deep venous thrombosis)     RT ARM-PT ON LOVENOX  . Hx of iron deficiency anemia 11/26/13  . S/P radiation therapy 02-04-14 to 03-18-14                               1) Left Chest Wall / 50 Gy in 25 fractions/ 2) Left Supraclavicular fossa/ 46 Gy in 23  fractions / 3) Left Posterior Axillary boost / 8.533 Gy in 23 fractions/ 4) Left Chest Wall Scar boost / 10 Gy in 5 fractions    PAST SURGICAL HISTORY: Past Surgical History  Procedure Laterality Date  . Tubal ligation    . Portacath placement Right 05/22/2013    Procedure: INSERTION PORT-A-CATH;  Surgeon: Erika Bookbinder, MD;  Location: [facility];  Service: General;  Laterality: Right;  . Left breast needle core biopsy Left 05/21/13  . Left breast needle core  biopsy Left 06/22/13  . Mastectomy modified radical Left 11/15/2013    Procedure: LEFT MASTECTOMY MODIFIED RADICAL;  Surgeon: Erika Bookbinder, MD;  Location: [facility];  Service: General;  Laterality: Left;    FAMILY HISTORY Family History  Problem Relation Age of Onset  . Hypertension Mother   . Diabetes Father 39  .  Stomach cancer Maternal Aunt   . Hypertension Maternal Grandmother   . Hypertension Maternal Grandfather   . Diabetes Paternal Grandmother   . Diabetes Paternal Grandfather   . Diabetes Paternal Uncle   . Diabetes Paternal Uncle   . Diabetes Paternal Uncle   The patient's mother is alive, age 40. The patient's father died from complications of diabetes at the age of 45. The patient has 2 brothers, one sister. There is no history of breast or ovarian cancer in the family to her knowledge .the patient has not undergone genetic testing   GYNECOLOGIC HISTORY:  Menarche age 38, first live birth age 35. The patient is GX P3. The patient has been on goserelin since 06/13/2013, given monthly  SOCIAL HISTORY:  The patient has worked at as a Furniture conservator/restorer and a Retail buyer. She is currently applying for disability. At home she lives with her youngest son, Erika [name], 59; with [name]; and with [name].   ADVANCED DIRECTIVES: Not in place  HEALTH MAINTENANCE: History  Substance Use Topics  . Smoking status: Never Smoker   . Smokeless tobacco: Never Used  . Alcohol Use: No     Mammogram: 06/22/2013 Colonoscopy: Bone Density Scan:  Pap Smear:  Eye Exam:  Vitamin D Level:   Lipid Panel:    Allergies  Allergen Reactions  . Doxycycline Nausea And Vomiting    Current Outpatient Prescriptions  Medication Sig Dispense Refill  . azithromycin (ZITHROMAX Z-PAK) 250 MG tablet Take 2 tabs (500 mg) PO on day # 1; then take 1 tab (250 mg) PO QD till gone. 6 each 0  . cyclobenzaprine (FLEXERIL) 5 MG tablet TAKE 1 TABLET BY MOUTH THREE TIMES A DAY FOR MUSCLE SPASMS 30 tablet 2  . erythromycin (ROMYCIN) ophthalmic ointment Place 1 application into both eyes 4 (four) times daily. 3.5 g 0  . fentaNYL (DURAGESIC - DOSED MCG/HR) 25 MCG/HR patch Place 1 patch (25 mcg total) onto the skin every 3 (three) days. 10 patch 0  . fentaNYL (DURAGESIC - DOSED MCG/HR)  75 MCG/HR Place 1 patch (75 mcg total) onto the skin every 3 (three) days. 10 patch 0  . gabapentin (NEURONTIN) 300 MG capsule Take 1 capsule (300 mg total) by mouth 3 (three) times daily. 90 capsule 2  . loratadine (CLARITIN) 10 MG tablet Take 10 mg by mouth daily as needed for allergies.    Marland Kitchen LORazepam (ATIVAN) 0.5 MG tablet Take 1 tablet (0.5 mg total) by mouth every 6 (six) hours as needed for anxiety. 30 tablet 0  . ondansetron (ZOFRAN) 8 MG tablet Take by mouth every 8 (  eight) hours as needed for nausea or vomiting.    Marland Kitchen oxyCODONE (OXY IR/ROXICODONE) 5 MG immediate release tablet Take 1 tablet (5 mg total) by mouth every 8 (eight) hours as needed for severe pain or breakthrough pain. 90 tablet 0  . potassium chloride SA (K-DUR,KLOR-CON) 20 MEQ tablet Take 1 tablet (20 mEq total) by mouth 4 (four) times daily. 120 tablet 1  . traMADol (ULTRAM) 50 MG tablet Take 1-2 tablets (50-100 mg total) by mouth every 6 (six) hours as needed. (Patient not taking: Reported on 04/15/2015) 30 tablet 1  . warfarin (COUMADIN) 5 MG tablet Take 2.5-5 mg by mouth daily. Patient is getting regulated on Coumadin. Pt takes 2.67m daily .    . zolpidem (AMBIEN) 5 MG tablet Take 1 tablet (5 mg total) by mouth at bedtime as needed for sleep. (Patient not taking: Reported on 04/15/2015) 30 tablet 0   No current facility-administered medications for this visit.   Facility-Administered Medications Ordered in Other Visits  Medication Dose Route Frequency Provider Last Rate Last Dose  . topical emolient (BIAFINE) emulsion   Topical Daily SEppie Gibson MD        OBJECTIVE: middle-aged African American woman in no acute distress Filed Vitals:   05/05/15 1114  BP: 111/72  Pulse: 105  Temp: 98.1 F (36.7 C)  Resp: 18     Body mass index is 28.86 kg/(m^2).      ECOG FS:2 - Symptomatic, <50% confined to bed   Sclerae unicteric, pupils round and equal Oropharynx clear and moist-- no thrush or other lesions No cervical or  supraclavicular adenopathy Lungs no rales or rhonchi Heart regular rate and rhythm Abd soft, nontender, positive bowel sounds MSK no focal spinal tenderness, no upper extremity lymphedema Neuro: nonfocal, well oriented, appropriate affect Breasts: The right breast is unremarkable. The left breast is status post mastectomy and radiation. There is hyperpigmentation. There is no desquamation. There is no evidence of local recurrence. The left axilla is benign.    LAB RESULTS:  CMP     Component Value Date/Time   NA 140 05/05/2015 1042   NA 137 02/24/2015 0624   K 3.2* 05/05/2015 1042   K 4.1 02/24/2015 0624   CL 103 02/24/2015 0624   CL 102 06/01/2013 1500   CO2 27 05/05/2015 1042   CO2 26 02/24/2015 0624   GLUCOSE 84 05/05/2015 1042   GLUCOSE 94 02/24/2015 0624   GLUCOSE 95 06/01/2013 1500   BUN 5.3* 05/05/2015 1042   BUN 7 02/24/2015 0624   CREATININE 0.7 05/05/2015 1042   CREATININE 0.67 02/24/2015 0624   CALCIUM 9.2 05/05/2015 1042   CALCIUM 9.2 02/24/2015 0624   PROT 7.9 05/05/2015 1042   PROT 8.1 02/21/2015 2100   ALBUMIN 2.4* 05/05/2015 1042   ALBUMIN 3.2* 02/21/2015 2100   AST 32 05/05/2015 1042   AST 31 02/21/2015 2100   ALT 12 05/05/2015 1042   ALT 20 02/21/2015 2100   ALKPHOS 124 05/05/2015 1042   ALKPHOS 123* 02/21/2015 2100   BILITOT 0.34 05/05/2015 1042   BILITOT 0.6 02/21/2015 2100   GFRNONAA >90 02/24/2015 0624   GFRAA >90 02/24/2015 0624    I No results found for: SPEP  Lab Results  Component Value Date   WBC 7.6 05/05/2015   NEUTROABS 5.7 05/05/2015   HGB 9.7* 05/05/2015   HCT 29.6* 05/05/2015   MCV 87.0 05/05/2015   PLT 443* 05/05/2015      Chemistry      Component Value  Date/Time   NA 140 05/05/2015 1042   NA 137 02/24/2015 0624   K 3.2* 05/05/2015 1042   K 4.1 02/24/2015 0624   CL 103 02/24/2015 0624   CL 102 06/01/2013 1500   CO2 27 05/05/2015 1042   CO2 26 02/24/2015 0624   BUN 5.3* 05/05/2015 1042   BUN 7 02/24/2015 0624    CREATININE 0.7 05/05/2015 1042   CREATININE 0.67 02/24/2015 0624      Component Value Date/Time   CALCIUM 9.2 05/05/2015 1042   CALCIUM 9.2 02/24/2015 0624   ALKPHOS 124 05/05/2015 1042   ALKPHOS 123* 02/21/2015 2100   AST 32 05/05/2015 1042   AST 31 02/21/2015 2100   ALT 12 05/05/2015 1042   ALT 20 02/21/2015 2100   BILITOT 0.34 05/05/2015 1042   BILITOT 0.6 02/21/2015 2100       Lab Results  Component Value Date   LABCA2 35 05/18/2013    No components found for: [id number]   Recent Labs Lab 05/05/15 1042  INR 4.90*    Urinalysis    Component Value Date/Time   COLORURINE YELLOW 02/24/2015 1632    STUDIES: Ct Chest W Contrast  05/01/2015   CLINICAL DATA:  Left upper inner quadrant breast cancer diagnosed in May 2014, with left mastectomy. Ongoing chemotherapy. Prior radiotherapy.  EXAM: CT CHEST, ABDOMEN, AND PELVIS WITH CONTRAST  TECHNIQUE: Multidetector CT imaging of the chest, abdomen and pelvis was performed following the standard protocol during bolus administration of intravenous contrast.  CONTRAST:  153m OMNIPAQUE IOHEXOL 300 MG/ML  SOLN  COMPARISON:  Multiple exams, including 01/28/2014  FINDINGS: CT CHEST FINDINGS  Mediastinum/Nodes: AP window lymph node 1.1 cm in short axis, previously approximately the same. Left lower paratracheal lymph node 0.9 cm in short axis, image 19 series 2, previously 0.5 cm. Subcarinal node 1.2 cm in short axis, image 28 series 2. Left mastectomy.  Lungs/Pleura: Unfortunately the thick rind of pleural tumor on the right appears worse, and has extensive nodularity. For example, on image 15 of series 2, there is a nearly circumferential rind of tumor around the apical segment currently, measuring up to 1.1 cm; previously there is only some minimal pleural thickening measuring up to 6 mm, and certainly not circumferential. The burden of tumor along the right hemidiaphragm is increased, and a pleural-based nodule laterally in the vicinity of  the minor fissure measures 2.7 by 2.2 cm on image 26 series 4, formerly 2.1 by 1.5 cm. Additional nodular tumor is present along the fissures.  Stable focal interstitial accentuation in the apical posterior segment left upper lobe. No left lung mass or nodule is identified.  Musculoskeletal: Subtle dextroconvex thoracic scoliosis.  CT ABDOMEN PELVIS FINDINGS  Hepatobiliary: Indistinct peripheral hypodensity in the right hepatic lobe on image 57 series 2 measures approximately 2.1 by 2.2 cm, although has very indistinct margins and is nearly isodense to the rest of the liver. This is adjacent to tumor along the hemidiaphragm, making it difficult to exclude invasion across the hemidiaphragm and to the liver, although this may also simply be a solitary liver metastatic lesion. There is some minimal hypodensity adjacent to the falciform ligament suggesting focal fatty infiltration. No new liver lesion is identified.  Numerous gallstones fill the gallbladder, of variable size but measuring up to 1.5 cm in long axis.  Pancreas: Unremarkable  Spleen: Unremarkable  Adrenals/Urinary Tract: Unremarkable  Stomach/Bowel: Unremarkable  Vascular/Lymphatic: Prior acute left renal vein thrombosis appears to have mostly cleared, with only a very thin  nonocclusive band of residual filling defect of chronic thrombus superiorly within the vein.  Reproductive: Unremarkable  Other: No supplemental non-categorized findings.  Musculoskeletal: Mild osteitis condensans ilii.  IMPRESSION: 1. Unfortunately there is increased in the diffuse pleural rind of tumor on the right, along with increased nodularity. 2. The right hepatic lobe metastatic lesion is highly indistinct and difficult to measure reliably, and accordingly I am not certain whether it has changed significantly from prior. 3. The prior acute left renal vein thrombosis has mostly cleared, with only a very thin nonocclusive band of residual filling defect indicating chronic thrombus  superiorly within the vein. 4. Borderline mediastinal adenopathy. This may represent early spread to the mediastinal lymph nodes. 5. Cholelithiasis.   Electronically Signed   By: Van Clines M.D.   On: 05/01/2015 11:05   Ct Abdomen Pelvis W Contrast  05/01/2015   CLINICAL DATA:  Left upper inner quadrant breast cancer diagnosed in May 2014, with left mastectomy. Ongoing chemotherapy. Prior radiotherapy.  EXAM: CT CHEST, ABDOMEN, AND PELVIS WITH CONTRAST  TECHNIQUE: Multidetector CT imaging of the chest, abdomen and pelvis was performed following the standard protocol during bolus administration of intravenous contrast.  CONTRAST:  149m OMNIPAQUE IOHEXOL 300 MG/ML  SOLN  COMPARISON:  Multiple exams, including 01/28/2014  FINDINGS: CT CHEST FINDINGS  Mediastinum/Nodes: AP window lymph node 1.1 cm in short axis, previously approximately the same. Left lower paratracheal lymph node 0.9 cm in short axis, image 19 series 2, previously 0.5 cm. Subcarinal node 1.2 cm in short axis, image 28 series 2. Left mastectomy.  Lungs/Pleura: Unfortunately the thick rind of pleural tumor on the right appears worse, and has extensive nodularity. For example, on image 15 of series 2, there is a nearly circumferential rind of tumor around the apical segment currently, measuring up to 1.1 cm; previously there is only some minimal pleural thickening measuring up to 6 mm, and certainly not circumferential. The burden of tumor along the right hemidiaphragm is increased, and a pleural-based nodule laterally in the vicinity of the minor fissure measures 2.7 by 2.2 cm on image 26 series 4, formerly 2.1 by 1.5 cm. Additional nodular tumor is present along the fissures.  Stable focal interstitial accentuation in the apical posterior segment left upper lobe. No left lung mass or nodule is identified.  Musculoskeletal: Subtle dextroconvex thoracic scoliosis.  CT ABDOMEN PELVIS FINDINGS  Hepatobiliary: Indistinct peripheral hypodensity in  the right hepatic lobe on image 57 series 2 measures approximately 2.1 by 2.2 cm, although has very indistinct margins and is nearly isodense to the rest of the liver. This is adjacent to tumor along the hemidiaphragm, making it difficult to exclude invasion across the hemidiaphragm and to the liver, although this may also simply be a solitary liver metastatic lesion. There is some minimal hypodensity adjacent to the falciform ligament suggesting focal fatty infiltration. No new liver lesion is identified.  Numerous gallstones fill the gallbladder, of variable size but measuring up to 1.5 cm in long axis.  Pancreas: Unremarkable  Spleen: Unremarkable  Adrenals/Urinary Tract: Unremarkable  Stomach/Bowel: Unremarkable  Vascular/Lymphatic: Prior acute left renal vein thrombosis appears to have mostly cleared, with only a very thin nonocclusive band of residual filling defect of chronic thrombus superiorly within the vein.  Reproductive: Unremarkable  Other: No supplemental non-categorized findings.  Musculoskeletal: Mild osteitis condensans ilii.  IMPRESSION: 1. Unfortunately there is increased in the diffuse pleural rind of tumor on the right, along with increased nodularity. 2. The right hepatic lobe metastatic  lesion is highly indistinct and difficult to measure reliably, and accordingly I am not certain whether it has changed significantly from prior. 3. The prior acute left renal vein thrombosis has mostly cleared, with only a very thin nonocclusive band of residual filling defect indicating chronic thrombus superiorly within the vein. 4. Borderline mediastinal adenopathy. This may represent early spread to the mediastinal lymph nodes. 5. Cholelithiasis.   Electronically Signed   By: Van Clines M.D.   On: 05/01/2015 11:05    ASSESSMENT: 44 y.o.  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  (0) genetic testing of the ATM, BARD1, BRCA1, BRCA2, BRIP1, CDH1, CHEK2,  EPCAM, FANCC, MLH1, MSH2, MSH6, NBN, PALB2, PMS2, PTEN, RAD51C, RAD51D, STK11, TP53, and XRCC2 genes showed no deleterious mutations  (1) status post left breast biopsy 04/23/2013 for a clinical T4 N1, stage IIIB invasive ductal carcinoma, grade 3,  estrogen receptor 14% positive with moderate staining intensity, progesterone receptor negative,  with an MIB-1 of 100% and HER-2/neu  nonamplified  (2) biopsy of a left axillary lymph node [phone] was positive  (3) started monthly goserelin July of 02/01/2013  (4)  received  neoadjuvant chemotherapy with doxorubicin and cyclophosphamide in dose dense fashion x4, followed by carboplatin and paclitaxel x1, discontinued due to neuropathy, followed by carboplatin and gemcitabine, both given day 1 of each 14 day cycle x6, completed 10/12/2013 .   (5) right upper extremity DVT documented 06/29/2013,  treated with Lovenox from July  through December 2014    (6) status post left modified radical mastectomy 11/15/2013 for a residual pT3 pN0 invasive ductal carcinoma, grade 3, with repeat prognostic panel triple negative. All 12 axillary lymph nodes were clear. Margins were negative   (5) completed adjuvant radiation therapy 03/18/2014,  with capecitabine added 2-3 weeks into treatment.  METASTATIC DISEASE  (6) CT scans of the chest, abdomen, and pelvis 06/26/2014 showed a large right pleural effusion  with pleural studding, 2 left upper lobe nodules, and two  liver lesions. Cytology from right thoracentesis 07/03/2014 showed malignant cells consistent with the patient's known primary  (7) started eribulin 07/09/2014, given days 1 and 8 of each 21 day cycle; restagind studies after cycle 3 showed evidence of response. Discontinued after cycle 5 (last dose 10/08/14) because of worsening peripheral neuropathy--also with progression.   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 Antithrombin level was normal.  (12) to start liposomal doxorubicin (Doxil) 05/14/2015, repeated every 28 days  PLAN:  Melena's cancer unfortunately has progressed despite her current treatment. She is in remarkably good shape and therefore certainly warrants more treatment. She understands that unfortunately stage IV disease, being incurable, does mean that we will try one thing after another, some of which will work for some time, some of which will not work.  At this point, and especially given her history of neuropathy, options include carboplatin, capecitabine, and Doxil. I think Doxil has a good side effect profile and may be effective in her situation. We discussed the possible toxicities, side effects and complications of  this agent.  I'm setting her up for an MRI of the liver so we have a better idea of the measurable disease there, and an MRI of the brain to make sure we are not facing a "too fraught war". I'm also obtaining an echocardiogram. She will have her first Doxil treatment June 1. We will see her approximately 10 days later to assess tolerance. The plan is to do 3 cycles and then restage  In the meantime I have encouraged her to continue her walking program. She will call with any problems that may develop before her next visit here.  Erika Cruel, MD 05/05/2015 11:35 AM

## 2015-05-05 NOTE — Progress Notes (Signed)
Pt seen after MD appmt today She will not go to infusion today as he is changing her chemo Pt states she has not had any changes to meds or diet, no OTC meds  Will hold coumadin for 2 days.  No s/s bleeding  Will restart on coumadin 2mg  daily until we see next week (05/15/15) during her 12:30 infusion  Coumadin 2mg  samples #10 [id number]  Exp 8/16

## 2015-05-06 ENCOUNTER — Ambulatory Visit: Payer: Medicaid Other

## 2015-05-14 ENCOUNTER — Other Ambulatory Visit: Payer: Self-pay

## 2015-05-14 ENCOUNTER — Telehealth: Payer: Self-pay | Admitting: Oncology

## 2015-05-14 ENCOUNTER — Telehealth: Payer: Self-pay | Admitting: *Deleted

## 2015-05-14 DIAGNOSIS — C50212 Malignant neoplasm of upper-inner quadrant of left female breast: Secondary | ICD-10-CM

## 2015-05-14 MED ORDER — FENTANYL 25 MCG/HR TD PT72: 25.0000 ug | MEDICATED_PATCH | TRANSDERMAL | Status: DC

## 2015-05-14 MED ORDER — CYCLOBENZAPRINE HCL 5 MG PO TABS: ORAL_TABLET | ORAL | Status: DC

## 2015-05-14 MED ORDER — OXYCODONE HCL 5 MG PO TABS
5.0000 mg | ORAL_TABLET | Freq: Three times a day (TID) | ORAL | Status: DC | PRN
Start: 2015-05-14 — End: 2015-06-13

## 2015-05-14 MED ORDER — FENTANYL 75 MCG/HR TD PT72: 75.0000 ug | MEDICATED_PATCH | TRANSDERMAL | Status: DC

## 2015-05-14 MED ORDER — LORAZEPAM 0.5 MG PO TABS: 0.5000 mg | ORAL_TABLET | Freq: Four times a day (QID) | ORAL | Status: DC | PRN

## 2015-05-14 NOTE — Telephone Encounter (Signed)
Patient confirmed ECHO 06/03 & 06/06 treatment.

## 2015-05-14 NOTE — Telephone Encounter (Signed)
"  I need a refill on my medications and would like to pick these up today.  I need: 1. Flexeril 2. Ativan 3. Oxy 5 mg 4. Fentanyl 75 mcg 5. Fentanyl 61mcg Return number is [phone]

## 2015-05-14 NOTE — Progress Notes (Signed)
ECHO not scheduled - chemo 7/2 cancelled.  Pt will still need to come to clinic for lab and coumadin clinic.  Pt notified and voiced understanding.   Pharmacy and Infusion notified.  POF sent to reschedule infusion and to schedule Echo.

## 2015-05-14 NOTE — Telephone Encounter (Signed)
[facility] faxed Prior authorization request for Fentanyl 25 mcg.  Request to Managed Care for review.

## 2015-05-15 ENCOUNTER — Other Ambulatory Visit (HOSPITAL_BASED_OUTPATIENT_CLINIC_OR_DEPARTMENT_OTHER): Payer: No Typology Code available for payment source

## 2015-05-15 ENCOUNTER — Encounter: Payer: Self-pay | Admitting: Oncology

## 2015-05-15 ENCOUNTER — Ambulatory Visit: Payer: No Typology Code available for payment source

## 2015-05-15 ENCOUNTER — Ambulatory Visit (HOSPITAL_BASED_OUTPATIENT_CLINIC_OR_DEPARTMENT_OTHER): Payer: No Typology Code available for payment source | Admitting: Pharmacist

## 2015-05-15 DIAGNOSIS — T451X5A Adverse effect of antineoplastic and immunosuppressive drugs, initial encounter: Secondary | ICD-10-CM

## 2015-05-15 DIAGNOSIS — C50212 Malignant neoplasm of upper-inner quadrant of left female breast: Secondary | ICD-10-CM

## 2015-05-15 DIAGNOSIS — D6481 Anemia due to antineoplastic chemotherapy: Secondary | ICD-10-CM

## 2015-05-15 DIAGNOSIS — I823 Embolism and thrombosis of renal vein: Secondary | ICD-10-CM

## 2015-05-15 DIAGNOSIS — C50912 Malignant neoplasm of unspecified site of left female breast: Secondary | ICD-10-CM

## 2015-05-15 LAB — COMPREHENSIVE METABOLIC PANEL (CC13)
ALK PHOS: 145 U/L (ref 40–150)
ALT: 10 U/L (ref 0–55)
ANION GAP: 8 meq/L (ref 3–11)
AST: 30 U/L (ref 5–34)
Albumin: 2.4 g/dL — ABNORMAL LOW (ref 3.5–5.0)
BILIRUBIN TOTAL: 0.39 mg/dL (ref 0.20–1.20)
BUN: 8.7 mg/dL (ref 7.0–26.0)
CO2: 28 meq/L (ref 22–29)
Calcium: 9.9 mg/dL (ref 8.4–10.4)
Chloride: 101 mEq/L (ref 98–109)
Creatinine: 0.7 mg/dL (ref 0.6–1.1)
EGFR: >90 mL/min/{1.73_m2} (ref >90)
Glucose: 90 mg/dl (ref 70–140)
Potassium: 4.3 mEq/L (ref 3.5–5.1)
SODIUM: 137 meq/L (ref 136–145)
Total Protein: 9.3 g/dL — ABNORMAL HIGH (ref 6.4–8.3)

## 2015-05-15 LAB — PROTIME-INR
INR: 1.6 — ABNORMAL LOW (ref 2.00–3.50)
Protime: 19.2 Seconds — ABNORMAL HIGH (ref 10.6–13.4)

## 2015-05-15 LAB — CBC WITH DIFFERENTIAL/PLATELET
BASO%: 0.1 % (ref 0.0–2.0)
BASOS ABS: 0 10*3/uL (ref 0.0–0.1)
EOS%: 1.5 % (ref 0.0–7.0)
Eosinophils Absolute: 0.1 10*3/uL (ref 0.0–0.5)
HCT: 31.6 % — ABNORMAL LOW (ref 34.8–46.6)
HEMOGLOBIN: 10.1 g/dL — AB (ref 11.6–15.9)
LYMPH#: 1.2 10*3/uL (ref 0.9–3.3)
LYMPH%: 15.3 % (ref 14.0–49.7)
MCH: 28.1 pg (ref 25.1–34.0)
MCHC: 32 g/dL (ref 31.5–36.0)
MCV: 87.8 fL (ref 79.5–101.0)
MONO#: 1.1 10*3/uL — ABNORMAL HIGH (ref 0.1–0.9)
MONO%: 13.8 % (ref 0.0–14.0)
NEUT%: 69.3 % (ref 38.4–76.8)
NEUTROS ABS: 5.5 10*3/uL (ref 1.5–6.5)
Platelets: 483 10*3/uL — ABNORMAL HIGH (ref 145–400)
RBC: 3.6 10*6/uL — AB (ref 3.70–5.45)
RDW: 18.1 % — AB (ref 11.2–14.5)
WBC: 8 10*3/uL (ref 3.9–10.3)

## 2015-05-15 LAB — POCT INR: INR: 1.6

## 2015-05-15 NOTE — Progress Notes (Signed)
I faxed request for fentanyl patch via medicaid

## 2015-05-15 NOTE — Patient Instructions (Signed)
INR below goal today at 1.6 (goal 2-3) Increase coumadin to 2.5 mg  Daily except take 2 mg on Tuesday and Saturday Recheck INR on 05/19/15; lab at 9am, infusion at 9:30 and we will see you in infusion

## 2015-05-15 NOTE — Progress Notes (Signed)
INR below goal today at 1.6 (goal 2-3) Erika [name] is doing well She is scheduled for ECHO tomorrow morning before starting Doxil on Monday 6/6 INR elevated last week resulting in holding coumadin x 2 days and decreasing dose to 2 mg daily on 5/23 Pt has no complaints No unusual bleeding or bruising or S/Sx of clotting No diet or medication changes No missed or extra doses Pt has a few tablets left of her 2 mg samples and may need more next week if she is to require some days of 2 mg Will slowly increase coumadin dose (pt previously on 2.5 mg daily)  Plan: Increase coumadin to 2.5 mg  Daily except take 2 mg on Tuesday and Saturday Recheck INR on 05/19/15; lab at 9am, infusion at 9:30 and we will see you in infusion

## 2015-05-16 ENCOUNTER — Ambulatory Visit (HOSPITAL_COMMUNITY)
Admission: RE | Admit: 2015-05-16 | Discharge: 2015-05-16 | Disposition: A | Discharge status: Home / Self Care | Payer: No Typology Code available for payment source | Source: Ambulatory Visit | Attending: Oncology | Admitting: Oncology

## 2015-05-16 DIAGNOSIS — I82409 Acute embolism and thrombosis of unspecified deep veins of unspecified lower extremity: Secondary | ICD-10-CM | POA: Diagnosis not present

## 2015-05-16 DIAGNOSIS — Z9012 Acquired absence of left breast and nipple: Secondary | ICD-10-CM | POA: Diagnosis not present

## 2015-05-16 DIAGNOSIS — C50212 Malignant neoplasm of upper-inner quadrant of left female breast: Secondary | ICD-10-CM

## 2015-05-16 DIAGNOSIS — Z01810 Encounter for preprocedural cardiovascular examination: Secondary | ICD-10-CM | POA: Insufficient documentation

## 2015-05-16 DIAGNOSIS — Z853 Personal history of malignant neoplasm of breast: Secondary | ICD-10-CM | POA: Diagnosis not present

## 2015-05-16 DIAGNOSIS — Z923 Personal history of irradiation: Secondary | ICD-10-CM | POA: Insufficient documentation

## 2015-05-16 MED ORDER — PERFLUTREN LIPID MICROSPHERE
INTRAVENOUS | Status: AC
  Filled 2015-05-16: qty 10

## 2015-05-16 NOTE — Progress Notes (Signed)
  Echocardiogram 2D Echocardiogram has been performed.  Donata Clay 05/16/2015, 11:26 AM

## 2015-05-18 ENCOUNTER — Other Ambulatory Visit: Payer: Self-pay | Admitting: Oncology

## 2015-05-18 MED ORDER — CAPECITABINE 500 MG PO TABS: 1500.0000 mg | ORAL_TABLET | Freq: Two times a day (BID) | ORAL | Status: DC

## 2015-05-18 NOTE — Progress Notes (Unsigned)
I called [name] and told her that her ejection fraction is low and were not going to be able to give her the Doxil as planned. I'm going to try to obtain capecitabine for her. She does have a repeat echo already scheduled for the eighth. She will likely be started on lisinopril, carvedilol, and other medications for her heart failure. She also has a brain MRI scheduled for next week. I hope we can obtain the capecitabine for her before her next visit here which will be June 14.

## 2015-05-19 ENCOUNTER — Other Ambulatory Visit (HOSPITAL_BASED_OUTPATIENT_CLINIC_OR_DEPARTMENT_OTHER): Payer: No Typology Code available for payment source

## 2015-05-19 ENCOUNTER — Ambulatory Visit: Payer: No Typology Code available for payment source

## 2015-05-19 ENCOUNTER — Encounter: Payer: Self-pay | Admitting: *Deleted

## 2015-05-19 ENCOUNTER — Ambulatory Visit (HOSPITAL_BASED_OUTPATIENT_CLINIC_OR_DEPARTMENT_OTHER): Payer: No Typology Code available for payment source | Admitting: Pharmacist

## 2015-05-19 ENCOUNTER — Encounter: Payer: Self-pay | Admitting: Oncology

## 2015-05-19 DIAGNOSIS — I823 Embolism and thrombosis of renal vein: Secondary | ICD-10-CM

## 2015-05-19 DIAGNOSIS — C50212 Malignant neoplasm of upper-inner quadrant of left female breast: Secondary | ICD-10-CM

## 2015-05-19 DIAGNOSIS — T451X5A Adverse effect of antineoplastic and immunosuppressive drugs, initial encounter: Secondary | ICD-10-CM

## 2015-05-19 DIAGNOSIS — C50912 Malignant neoplasm of unspecified site of left female breast: Secondary | ICD-10-CM

## 2015-05-19 DIAGNOSIS — D6481 Anemia due to antineoplastic chemotherapy: Secondary | ICD-10-CM

## 2015-05-19 LAB — PROTIME-INR
INR: 1.9 — AB (ref 2.00–3.50)
Protime: 22.8 Seconds — ABNORMAL HIGH (ref 10.6–13.4)

## 2015-05-19 LAB — POCT INR: INR: 1.9

## 2015-05-19 NOTE — Progress Notes (Signed)
Pt was seen in clinic as her infusion appmt was canceled MD decided to put patient on Xeloda vs starting Doxil today No med changes  Diet consistent Will increase dose slightly to 2.5 mg daily She previously had 2 days at 2mg   She has MD appmt next Tue Will see on this day as she is already in clinic.  Next appmt is Tue, July 5

## 2015-05-19 NOTE — Progress Notes (Signed)
RECEIVED A FAX FROM [facility] CONCERNING A PRIOR AUTHORIZATION FOR CAPECITABINE.THIS REQUEST WAS PLACED IN THE MANAGED CARE BIN.

## 2015-05-19 NOTE — Patient Instructions (Signed)
Increase coumadin to 2.5 mg daily. Recheck INR on 05/27/15; lab at 12:45pm, MD at 1:15pm and Coumadin clinic at 1:45pm

## 2015-05-19 NOTE — Progress Notes (Signed)
I faxed prior auth for capectiabine

## 2015-05-20 ENCOUNTER — Encounter: Payer: Self-pay | Admitting: Oncology

## 2015-05-20 ENCOUNTER — Other Ambulatory Visit (HOSPITAL_COMMUNITY): Payer: No Typology Code available for payment source

## 2015-05-20 NOTE — Progress Notes (Signed)
Faxed notes to [facility] for appeal on denial of capecitabine [phone]

## 2015-05-21 ENCOUNTER — Ambulatory Visit (HOSPITAL_COMMUNITY): Payer: No Typology Code available for payment source | Attending: *Deleted

## 2015-05-21 ENCOUNTER — Other Ambulatory Visit: Payer: Self-pay

## 2015-05-21 ENCOUNTER — Encounter: Payer: Self-pay | Admitting: Oncology

## 2015-05-21 ENCOUNTER — Other Ambulatory Visit: Payer: Self-pay | Admitting: Internal Medicine

## 2015-05-21 DIAGNOSIS — Z9221 Personal history of antineoplastic chemotherapy: Secondary | ICD-10-CM | POA: Diagnosis not present

## 2015-05-21 DIAGNOSIS — C50919 Malignant neoplasm of unspecified site of unspecified female breast: Secondary | ICD-10-CM | POA: Insufficient documentation

## 2015-05-21 MED ORDER — PERFLUTREN LIPID MICROSPHERE
2.0000 mL | INTRAVENOUS | Status: DC | PRN
  Administered 2015-05-21: 2 mL via INTRAVENOUS

## 2015-05-21 NOTE — Progress Notes (Signed)
Per [employer] capectabine has been approved 05/19/15-11/18/15 Josem Kaufmann [id number]

## 2015-05-22 ENCOUNTER — Telehealth: Payer: Self-pay | Admitting: *Deleted

## 2015-05-22 ENCOUNTER — Other Ambulatory Visit: Payer: Self-pay | Admitting: Oncology

## 2015-05-22 DIAGNOSIS — I429 Cardiomyopathy, unspecified: Secondary | ICD-10-CM

## 2015-05-22 NOTE — Telephone Encounter (Signed)
TC from pt requesting to review her appts for this week and next. She verbalized understanding and wrote the dates and times.

## 2015-05-23 ENCOUNTER — Ambulatory Visit (HOSPITAL_COMMUNITY)
Admission: RE | Admit: 2015-05-23 | Discharge: 2015-05-23 | Disposition: A | Discharge status: Home / Self Care | Payer: No Typology Code available for payment source | Source: Ambulatory Visit | Attending: Oncology | Admitting: Oncology

## 2015-05-23 DIAGNOSIS — H539 Unspecified visual disturbance: Secondary | ICD-10-CM | POA: Diagnosis not present

## 2015-05-23 DIAGNOSIS — R410 Disorientation, unspecified: Secondary | ICD-10-CM | POA: Diagnosis not present

## 2015-05-23 DIAGNOSIS — C50212 Malignant neoplasm of upper-inner quadrant of left female breast: Secondary | ICD-10-CM | POA: Insufficient documentation

## 2015-05-23 DIAGNOSIS — C787 Secondary malignant neoplasm of liver and intrahepatic bile duct: Secondary | ICD-10-CM | POA: Diagnosis not present

## 2015-05-23 DIAGNOSIS — C782 Secondary malignant neoplasm of pleura: Secondary | ICD-10-CM | POA: Diagnosis not present

## 2015-05-23 DIAGNOSIS — K8020 Calculus of gallbladder without cholecystitis without obstruction: Secondary | ICD-10-CM | POA: Diagnosis not present

## 2015-05-23 MED ORDER — GADOBENATE DIMEGLUMINE 529 MG/ML IV SOLN
20.0000 mL | Freq: Once | INTRAVENOUS | Status: AC | PRN
Start: 2015-05-23 — End: 2015-05-23
  Administered 2015-05-23: 16 mL via INTRAVENOUS

## 2015-05-26 ENCOUNTER — Other Ambulatory Visit: Payer: Self-pay | Admitting: Oncology

## 2015-05-27 ENCOUNTER — Other Ambulatory Visit (HOSPITAL_COMMUNITY): Payer: No Typology Code available for payment source

## 2015-05-27 ENCOUNTER — Telehealth: Payer: Self-pay | Admitting: *Deleted

## 2015-05-27 ENCOUNTER — Other Ambulatory Visit: Payer: Self-pay | Admitting: Nurse Practitioner

## 2015-05-27 ENCOUNTER — Ambulatory Visit (HOSPITAL_BASED_OUTPATIENT_CLINIC_OR_DEPARTMENT_OTHER): Payer: No Typology Code available for payment source | Admitting: Nurse Practitioner

## 2015-05-27 ENCOUNTER — Ambulatory Visit: Payer: No Typology Code available for payment source

## 2015-05-27 ENCOUNTER — Encounter: Payer: Self-pay | Admitting: Nurse Practitioner

## 2015-05-27 ENCOUNTER — Telehealth: Payer: Self-pay | Admitting: Oncology

## 2015-05-27 ENCOUNTER — Other Ambulatory Visit (HOSPITAL_BASED_OUTPATIENT_CLINIC_OR_DEPARTMENT_OTHER): Payer: No Typology Code available for payment source

## 2015-05-27 VITALS — BP 106/76 | HR 130 | Temp 98.5°F | Resp 20 | Wt 173.1 lb

## 2015-05-27 DIAGNOSIS — E860 Dehydration: Secondary | ICD-10-CM | POA: Diagnosis not present

## 2015-05-27 DIAGNOSIS — I823 Embolism and thrombosis of renal vein: Secondary | ICD-10-CM

## 2015-05-27 DIAGNOSIS — C50912 Malignant neoplasm of unspecified site of left female breast: Secondary | ICD-10-CM

## 2015-05-27 DIAGNOSIS — C50212 Malignant neoplasm of upper-inner quadrant of left female breast: Secondary | ICD-10-CM

## 2015-05-27 DIAGNOSIS — D6481 Anemia due to antineoplastic chemotherapy: Secondary | ICD-10-CM

## 2015-05-27 DIAGNOSIS — T451X5A Adverse effect of antineoplastic and immunosuppressive drugs, initial encounter: Secondary | ICD-10-CM

## 2015-05-27 LAB — COMPREHENSIVE METABOLIC PANEL (CC13)
ALK PHOS: 149 U/L (ref 40–150)
ALT: 10 U/L (ref 0–55)
ANION GAP: 11 meq/L (ref 3–11)
AST: 25 U/L (ref 5–34)
Albumin: 2.3 g/dL — ABNORMAL LOW (ref 3.5–5.0)
BILIRUBIN TOTAL: 0.35 mg/dL (ref 0.20–1.20)
BUN: 11.3 mg/dL (ref 7.0–26.0)
CO2: 27 meq/L (ref 22–29)
CREATININE: 0.8 mg/dL (ref 0.6–1.1)
Calcium: 9.9 mg/dL (ref 8.4–10.4)
Chloride: 98 mEq/L (ref 98–109)
EGFR: >90 mL/min/{1.73_m2} (ref >90)
Glucose: 112 mg/dl (ref 70–140)
Potassium: 3.8 mEq/L (ref 3.5–5.1)
SODIUM: 136 meq/L (ref 136–145)
TOTAL PROTEIN: 9.7 g/dL — AB (ref 6.4–8.3)

## 2015-05-27 LAB — CBC WITH DIFFERENTIAL/PLATELET
BASO%: 0.7 % (ref 0.0–2.0)
Basophils Absolute: 0.1 10*3/uL (ref 0.0–0.1)
EOS%: 1.5 % (ref 0.0–7.0)
Eosinophils Absolute: 0.1 10*3/uL (ref 0.0–0.5)
HCT: 31.5 % — ABNORMAL LOW (ref 34.8–46.6)
HGB: 10.3 g/dL — ABNORMAL LOW (ref 11.6–15.9)
LYMPH%: 9.9 % — ABNORMAL LOW (ref 14.0–49.7)
MCH: 28.3 pg (ref 25.1–34.0)
MCHC: 32.7 g/dL (ref 31.5–36.0)
MCV: 86.6 fL (ref 79.5–101.0)
MONO#: 0.5 10*3/uL (ref 0.1–0.9)
MONO%: 5.8 % (ref 0.0–14.0)
NEUT#: 7.8 10*3/uL — ABNORMAL HIGH (ref 1.5–6.5)
NEUT%: 82.1 % — ABNORMAL HIGH (ref 38.4–76.8)
Platelets: 519 10*3/uL — ABNORMAL HIGH (ref 145–400)
RBC: 3.63 10*6/uL — AB (ref 3.70–5.45)
RDW: 20.1 % — AB (ref 11.2–14.5)
WBC: 9.4 10*3/uL (ref 3.9–10.3)
lymph#: 0.9 10*3/uL (ref 0.9–3.3)

## 2015-05-27 LAB — PROTIME-INR
INR: 1.8 — ABNORMAL LOW (ref 2.00–3.50)
Protime: 21.6 Seconds — ABNORMAL HIGH (ref 10.6–13.4)

## 2015-05-27 NOTE — Telephone Encounter (Signed)
Called and left message for pt that she has an appt to receive IVF tomorrow 6/15 at [facility] at 1:45p and Gerald Stabs from pharmacy will see her in infusion as well for her Coumadin Clinic appt. Told pt to call this nurse so I will know she got the message. Message to be forwarded Gentry Fitz, NP.

## 2015-05-27 NOTE — Progress Notes (Signed)
ID: Erika [name] OB: 02/14/1971  MR#: [id number]  CSN#:[id number]  PCP: Erika Pillow, NP GYN:   SU: Dr. Donne [name] OTHER MD:  CHIEF COMPLAINT:  metastatic breast cancer  CURRENT TREATMENT: [facility],  Coumadin  BREAST CANCER HISTORY: From Dr. Dana [name] original intake note:  "Patient developed a left sided breast mass show up about 6 months prior to diagnosis. 2 months prior this began getting larger rapidly. She was seen in an urgent care where this mass was thought to be an abscess. Apparently there was an attempt to excise or drain this that was not successful and she was referred to Dr. Donne [name] for evaluation. She underwent punch biopsy on 04/23/13 of her skin and the mass. She was then also sent for mm/us. The biopsy shows invasive high grade carcinoma that appears to be breast. It is ER 14%, PR negative, HER-2/neu negative with a Ki-67 that approaches 100%. She has also undergone mm with at least 10 cm upper inner left breast tumor with mild overyling skin thickening. There is also a 3x10 mm cluster of calcs near there also. There is indeterminate 7 mm level 1 left axillary node, she underwent a lymph node biopsy that was positive for disease. No mm evidence of right breast malignancy. An MRI of the breasts was done on 05/22/13 and showed a 14 cm mass in the left breast protruding from the skin and also a 2.5 cm level II axillary node."  Her subsequent history is as detailed below  INTERVAL HISTORY:   Erika [name] returns today for follow up of her breast cancer, with her finacee, Erika [name]. At her last visit she was told that she would be starting with doxil because of progression noted on her last scan. However, she had an echocardiogram performed that demonstrated an EF of 30%. Capecitabine 1.23m BID was begun instead. She started this drug on 05/20/15. She believes she is tolerating this well. She has no change in bowel habits or mucositis. She has some peeling to her hands, but this is not  painful. Her main complaint today is excessive fatigue   REVIEW OF SYSTEMS:  [name] fevers, chills, nausea, or vomiting. Her appetite is fair, but she has not bee drinking well. Her HR is elevated in office, but she denies palpitations. She has some shortness of breath. She has no cough. The pain to her lower back and flank has been worse off an on this week. She is sleeping constantly. Her neuropathy is still present. She has no headaches or dizziness. Her visual accomodation is poor. She is more forgetful lately. A detailed review of systems is otherwise stable.  PAST MEDICAL HISTORY: Past Medical History  Diagnosis Date  . Breast cancer     Invasive High Grade Carcinoma  . Headache(784.0)     Hx: of Migraines  . Status post chemotherapy     Adriamycin and Cytoxan  . Anxiety     ANXIOUS ABOUT HAVEING BIG SURGERY - MASTECTOMY  . 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 DVT (deep venous thrombosis)     RT ARM-PT ON LOVENOX  . Hx of iron deficiency anemia 11/26/13  . S/P radiation therapy 02-04-14 to 03-18-14                               1) Left Chest Wall / 50 Gy in 25 fractions/ 2) Left Supraclavicular fossa/ 46 Gy in 23 fractions / 3)  Left Posterior Axillary boost / 8.533 Gy in 23 fractions/ 4) Left Chest Wall Scar boost / 10 Gy in 5 fractions    PAST SURGICAL HISTORY: Past Surgical History  Procedure Laterality Date  . Tubal ligation    . Portacath placement Right 05/22/2013    Procedure: INSERTION PORT-A-CATH;  Surgeon: [name], MD;  Location: [facility];  Service: General;  Laterality: Right;  . Left breast needle core biopsy Left 05/21/13  . Left breast needle core  biopsy Left 06/22/13  . Mastectomy modified radical Left 11/15/2013    Procedure: LEFT MASTECTOMY MODIFIED RADICAL;  Surgeon: [name], MD;  Location: [facility];  Service: General;  Laterality: Left;    FAMILY HISTORY Family History  Problem Relation Age of Onset  . Hypertension Mother   .  Diabetes Father 83  . Stomach cancer Maternal Aunt   . Hypertension Maternal Grandmother   . Hypertension Maternal Grandfather   . Diabetes Paternal Grandmother   . Diabetes Paternal Grandfather   . Diabetes Paternal Uncle   . Diabetes Paternal Uncle   . Diabetes Paternal Uncle   The patient's mother is alive, age 21. The patient's father died from complications of diabetes at the age of 51. The patient has 2 brothers, one sister. There is no history of breast or ovarian cancer in the family to her knowledge .the patient has not undergone genetic testing   GYNECOLOGIC HISTORY:  Menarche age 58, first live birth age 43. The patient is GX P3. The patient has been on goserelin since 06/13/2013, given monthly  SOCIAL HISTORY:  The patient has worked at as a Furniture conservator/restorer and a Retail buyer. She is currently applying for disability. At home she lives with her youngest son, Erika [name], 16; with [name]; and with [name].   ADVANCED DIRECTIVES: Not in place  HEALTH MAINTENANCE: History  Substance Use Topics  . Smoking status: Never Smoker   . Smokeless tobacco: Never Used  . Alcohol Use: No     Mammogram: 06/22/2013 Colonoscopy: Bone Density Scan:  Pap Smear:  Eye Exam:  Vitamin D Level:   Lipid Panel:    Allergies  Allergen Reactions  . Doxycycline Nausea And Vomiting    Current Outpatient Prescriptions  Medication Sig Dispense Refill  . capecitabine (XELODA) 500 MG tablet Take 3 tablets (1,500 mg total) by mouth 2 (two) times daily after a meal. 84 tablet 3  . fentaNYL (DURAGESIC - DOSED MCG/HR) 25 MCG/HR patch Place 1 patch (25 mcg total) onto the skin every 3 (three) days. 10 patch 0  . fentaNYL (DURAGESIC - DOSED MCG/HR) 75 MCG/HR Place 1 patch (75 mcg total) onto the skin every 3 (three) days. 10 patch 0  . gabapentin (NEURONTIN) 300 MG capsule Take 1 capsule (300 mg total) by mouth 3 (three) times daily. 90 capsule 2  .  LORazepam (ATIVAN) 0.5 MG tablet Take 1 tablet (0.5 mg total) by mouth every 6 (six) hours as needed for anxiety. 30 tablet 0  . oxyCODONE (OXY IR/ROXICODONE) 5 MG immediate release tablet Take 1 tablet (5 mg total) by mouth every 8 (eight) hours as needed for severe pain or breakthrough pain. 90 tablet 0  . potassium chloride SA (K-DUR,KLOR-CON) 20 MEQ tablet Take 1 tablet (20 mEq total) by mouth 4 (four) times daily. 120 tablet 1  . warfarin (COUMADIN) 5 MG tablet Take 2.5-5 mg by mouth daily. Patient is getting regulated on Coumadin. Pt takes 2.23m daily .    .Marland Kitchen  cyclobenzaprine (FLEXERIL) 5 MG tablet TAKE 1 TABLET BY MOUTH THREE TIMES A DAY FOR MUSCLE SPASMS (Patient not taking: Reported on 05/27/2015) 30 tablet 2  . erythromycin (ROMYCIN) ophthalmic ointment Place 1 application into both eyes 4 (four) times daily. (Patient not taking: Reported on 05/27/2015) 3.5 g 0  . loratadine (CLARITIN) 10 MG tablet Take 10 mg by mouth daily as needed for allergies.    Marland Kitchen ondansetron (ZOFRAN) 8 MG tablet Take by mouth every 8 (eight) hours as needed for nausea or vomiting.    . traMADol (ULTRAM) 50 MG tablet Take 1-2 tablets (50-100 mg total) by mouth every 6 (six) hours as needed. (Patient not taking: Reported on 04/15/2015) 30 tablet 1  . zolpidem (AMBIEN) 5 MG tablet Take 1 tablet (5 mg total) by mouth at bedtime as needed for sleep. (Patient not taking: Reported on 04/15/2015) 30 tablet 0   No current facility-administered medications for this visit.   Facility-Administered Medications Ordered in Other Visits  Medication Dose Route Frequency Provider Last Rate Last Dose  . topical emolient (BIAFINE) emulsion   Topical Daily Erika Gibson, MD        OBJECTIVE: middle-aged African American woman in no acute distress Filed Vitals:   05/27/15 1556  BP:   Pulse: 130  Temp: 98.5 F (36.9 C)  Resp: 20     Body mass index is 33.81 kg/(m^2).      ECOG FS:2 - Symptomatic, <50% confined to bed   Skin: warm, dry   HEENT: sclerae anicteric, conjunctivae pink, oropharynx clear. No thrush or mucositis.  Lymph Nodes: No cervical or supraclavicular lymphadenopathy  Lungs: clear to auscultation bilaterally, no rales, wheezes, or rhonci  Heart: regular rate and rhythm  Abdomen: round, soft, non tender, positive bowel sounds  Musculoskeletal: No focal spinal tenderness, no peripheral edema  Neuro: non focal, well oriented, obviously drowsy  Breasts: deferred   LAB RESULTS:  CMP     Component Value Date/Time   NA 136 05/27/2015 1402   NA 137 02/24/2015 0624   K 3.8 05/27/2015 1402   K 4.1 02/24/2015 0624   CL 103 02/24/2015 0624   CL 102 06/01/2013 1500   CO2 27 05/27/2015 1402   CO2 26 02/24/2015 0624   GLUCOSE 112 05/27/2015 1402   GLUCOSE 94 02/24/2015 0624   GLUCOSE 95 06/01/2013 1500   BUN 11.3 05/27/2015 1402   BUN 7 02/24/2015 0624   CREATININE 0.8 05/27/2015 1402   CREATININE 0.67 02/24/2015 0624   CALCIUM 9.9 05/27/2015 1402   CALCIUM 9.2 02/24/2015 0624   PROT 9.7* 05/27/2015 1402   PROT 8.1 02/21/2015 2100   ALBUMIN 2.3* 05/27/2015 1402   ALBUMIN 3.2* 02/21/2015 2100   AST 25 05/27/2015 1402   AST 31 02/21/2015 2100   ALT 10 05/27/2015 1402   ALT 20 02/21/2015 2100   ALKPHOS 149 05/27/2015 1402   ALKPHOS 123* 02/21/2015 2100   BILITOT 0.35 05/27/2015 1402   BILITOT 0.6 02/21/2015 2100   GFRNONAA >90 02/24/2015 0624   GFRAA >90 02/24/2015 0624    I No results found for: SPEP  Lab Results  Component Value Date   WBC 9.4 05/27/2015   NEUTROABS 7.8* 05/27/2015   HGB 10.3* 05/27/2015   HCT 31.5* 05/27/2015   MCV 86.6 05/27/2015   PLT 519* 05/27/2015      Chemistry      Component Value Date/Time   NA 136 05/27/2015 1402   NA 137 02/24/2015 0624   K 3.8 05/27/2015  1402   K 4.1 02/24/2015 0624   CL 103 02/24/2015 0624   CL 102 06/01/2013 1500   CO2 27 05/27/2015 1402   CO2 26 02/24/2015 0624   BUN 11.3 05/27/2015 1402   BUN 7 02/24/2015 0624   CREATININE  0.8 05/27/2015 1402   CREATININE 0.67 02/24/2015 0624      Component Value Date/Time   CALCIUM 9.9 05/27/2015 1402   CALCIUM 9.2 02/24/2015 0624   ALKPHOS 149 05/27/2015 1402   ALKPHOS 123* 02/21/2015 2100   AST 25 05/27/2015 1402   AST 31 02/21/2015 2100   ALT 10 05/27/2015 1402   ALT 20 02/21/2015 2100   BILITOT 0.35 05/27/2015 1402   BILITOT 0.6 02/21/2015 2100       Lab Results  Component Value Date   LABCA2 35 05/18/2013    No components found for: [id number]   Recent Labs Lab 05/27/15 1403  INR 1.80*    Urinalysis    Component Value Date/Time   COLORURINE YELLOW 02/24/2015 1632    STUDIES: Ct Chest W Contrast  05/01/2015   CLINICAL DATA:  Left upper inner quadrant breast cancer diagnosed in May 2014, with left mastectomy. Ongoing chemotherapy. Prior radiotherapy.  EXAM: CT CHEST, ABDOMEN, AND PELVIS WITH CONTRAST  TECHNIQUE: Multidetector CT imaging of the chest, abdomen and pelvis was performed following the standard protocol during bolus administration of intravenous contrast.  CONTRAST:  190m OMNIPAQUE IOHEXOL 300 MG/ML  SOLN  COMPARISON:  Multiple exams, including 01/28/2014  FINDINGS: CT CHEST FINDINGS  Mediastinum/Nodes: AP window lymph node 1.1 cm in short axis, previously approximately the same. Left lower paratracheal lymph node 0.9 cm in short axis, image 19 series 2, previously 0.5 cm. Subcarinal node 1.2 cm in short axis, image 28 series 2. Left mastectomy.  Lungs/Pleura: Unfortunately the thick rind of pleural tumor on the right appears worse, and has extensive nodularity. For example, on image 15 of series 2, there is a nearly circumferential rind of tumor around the apical segment currently, measuring up to 1.1 cm; previously there is only some minimal pleural thickening measuring up to 6 mm, and certainly not circumferential. The burden of tumor along the right hemidiaphragm is increased, and a pleural-based nodule laterally in the vicinity of the minor  fissure measures 2.7 by 2.2 cm on image 26 series 4, formerly 2.1 by 1.5 cm. Additional nodular tumor is present along the fissures.  Stable focal interstitial accentuation in the apical posterior segment left upper lobe. No left lung mass or nodule is identified.  Musculoskeletal: Subtle dextroconvex thoracic scoliosis.  CT ABDOMEN PELVIS FINDINGS  Hepatobiliary: Indistinct peripheral hypodensity in the right hepatic lobe on image 57 series 2 measures approximately 2.1 by 2.2 cm, although has very indistinct margins and is nearly isodense to the rest of the liver. This is adjacent to tumor along the hemidiaphragm, making it difficult to exclude invasion across the hemidiaphragm and to the liver, although this may also simply be a solitary liver metastatic lesion. There is some minimal hypodensity adjacent to the falciform ligament suggesting focal fatty infiltration. No new liver lesion is identified.  Numerous gallstones fill the gallbladder, of variable size but measuring up to 1.5 cm in long axis.  Pancreas: Unremarkable  Spleen: Unremarkable  Adrenals/Urinary Tract: Unremarkable  Stomach/Bowel: Unremarkable  Vascular/Lymphatic: Prior acute left renal vein thrombosis appears to have mostly cleared, with only a very thin nonocclusive band of residual filling defect of chronic thrombus superiorly within the vein.  Reproductive: Unremarkable  Other:  No supplemental non-categorized findings.  Musculoskeletal: Mild osteitis condensans ilii.  IMPRESSION: 1. Unfortunately there is increased in the diffuse pleural rind of tumor on the right, along with increased nodularity. 2. The right hepatic lobe metastatic lesion is highly indistinct and difficult to measure reliably, and accordingly I am not certain whether it has changed significantly from prior. 3. The prior acute left renal vein thrombosis has mostly cleared, with only a very thin nonocclusive band of residual filling defect indicating chronic thrombus  superiorly within the vein. 4. Borderline mediastinal adenopathy. This may represent early spread to the mediastinal lymph nodes. 5. Cholelithiasis.   Electronically Signed   By: Van Clines M.D.   On: 05/01/2015 11:05   Mr Jeri Cos CV Contrast  05/23/2015   CLINICAL DATA:  Personal history of left breast cancer. Assess for metastatic disease. Confusion and visual changes.  EXAM: MRI HEAD WITHOUT AND WITH CONTRAST  TECHNIQUE: Multiplanar, multiecho pulse sequences of the brain and surrounding structures were obtained without and with intravenous contrast.  CONTRAST:  16 cc MultiHance  COMPARISON:  07/11/2014  FINDINGS: The brain has a normal appearance on all pulse sequences without evidence of malformation, atrophy, old or acute infarction, mass lesion, hemorrhage, hydrocephalus or extra-axial collection. No pituitary mass. No fluid in the sinuses, middle ears or mastoids. No skull or skullbase lesion. There is flow in the major vessels at the base of the brain. Major venous sinuses show flow. After contrast administration, no abnormal enhancement occurs. No change since the previous study.  IMPRESSION: Normal study. No evidence of metastatic disease. No cause of the presenting symptoms is identified.   Electronically Signed   By: Nelson Chimes M.D.   On: 05/23/2015 17:25   Mr Abdomen W Wo Contrast  05/23/2015   CLINICAL DATA:  Metastatic breast cancer. Subsequent treatment evaluation.  EXAM: MRI ABDOMEN WITHOUT AND WITH CONTRAST  TECHNIQUE: Multiplanar multisequence MR imaging of the abdomen was performed both before and after the administration of intravenous contrast.  CONTRAST:  63m MULTIHANCE GADOBENATE DIMEGLUMINE 529 MG/ML IV SOLN  COMPARISON:  CT 05/01/2015, 01/28/2015  FINDINGS: Lower chest: There is a rind of nodular pleural thickening over the right hemidiaphragm consistent with extensive pleural metastasis.  Hepatobiliary: Lesion in the subcapsular right hepatic lobe is increased from CT  01/28/2015. The lesion on the most recent CT of 04/21/2015 is difficult to define. Lesion on current MRI measures 29 x 33 mm (image 45, series 1102) compared to 25 x 22 mm on 01/28/2015. No new lesions within the liver. Portal veins are patent.  Multiple gallstones.  Pancreas: Normal pancreatic parenchymal intensity. No ductal dilatation or inflammation.  Spleen: Normal spleen.  Adrenals/urinary tract: Adrenal glands are normal. There is a thin filling defect within the left renal vein. The renal vein is near completely recannulated.  Stomach/Bowel: Stomach and limited of the small bowel is unremarkable  Vascular/Lymphatic: Abdominal aortic normal caliber. No retroperitoneal periportal lymphadenopathy.  Musculoskeletal: No aggressive osseous lesion  IMPRESSION: 1. Thick rind of pleural metastasis over the right hemidiaphragm and along the right lung base. 2. Enlargement of the right hepatic lobe metastasis compared to CT of 01/28/2015. 3. Recanalization of the left renal vein. 4. Multiple gallstones the lumen gallbladder   Electronically Signed   By: SSuzy BouchardM.D.   On: 05/23/2015 17:16   Ct Abdomen Pelvis W Contrast  05/01/2015   CLINICAL DATA:  Left upper inner quadrant breast cancer diagnosed in May 2014, with left mastectomy. Ongoing chemotherapy. Prior  radiotherapy.  EXAM: CT CHEST, ABDOMEN, AND PELVIS WITH CONTRAST  TECHNIQUE: Multidetector CT imaging of the chest, abdomen and pelvis was performed following the standard protocol during bolus administration of intravenous contrast.  CONTRAST:  168m OMNIPAQUE IOHEXOL 300 MG/ML  SOLN  COMPARISON:  Multiple exams, including 01/28/2014  FINDINGS: CT CHEST FINDINGS  Mediastinum/Nodes: AP window lymph node 1.1 cm in short axis, previously approximately the same. Left lower paratracheal lymph node 0.9 cm in short axis, image 19 series 2, previously 0.5 cm. Subcarinal node 1.2 cm in short axis, image 28 series 2. Left mastectomy.  Lungs/Pleura:  Unfortunately the thick rind of pleural tumor on the right appears worse, and has extensive nodularity. For example, on image 15 of series 2, there is a nearly circumferential rind of tumor around the apical segment currently, measuring up to 1.1 cm; previously there is only some minimal pleural thickening measuring up to 6 mm, and certainly not circumferential. The burden of tumor along the right hemidiaphragm is increased, and a pleural-based nodule laterally in the vicinity of the minor fissure measures 2.7 by 2.2 cm on image 26 series 4, formerly 2.1 by 1.5 cm. Additional nodular tumor is present along the fissures.  Stable focal interstitial accentuation in the apical posterior segment left upper lobe. No left lung mass or nodule is identified.  Musculoskeletal: Subtle dextroconvex thoracic scoliosis.  CT ABDOMEN PELVIS FINDINGS  Hepatobiliary: Indistinct peripheral hypodensity in the right hepatic lobe on image 57 series 2 measures approximately 2.1 by 2.2 cm, although has very indistinct margins and is nearly isodense to the rest of the liver. This is adjacent to tumor along the hemidiaphragm, making it difficult to exclude invasion across the hemidiaphragm and to the liver, although this may also simply be a solitary liver metastatic lesion. There is some minimal hypodensity adjacent to the falciform ligament suggesting focal fatty infiltration. No new liver lesion is identified.  Numerous gallstones fill the gallbladder, of variable size but measuring up to 1.5 cm in long axis.  Pancreas: Unremarkable  Spleen: Unremarkable  Adrenals/Urinary Tract: Unremarkable  Stomach/Bowel: Unremarkable  Vascular/Lymphatic: Prior acute left renal vein thrombosis appears to have mostly cleared, with only a very thin nonocclusive band of residual filling defect of chronic thrombus superiorly within the vein.  Reproductive: Unremarkable  Other: No supplemental non-categorized findings.  Musculoskeletal: Mild osteitis  condensans ilii.  IMPRESSION: 1. Unfortunately there is increased in the diffuse pleural rind of tumor on the right, along with increased nodularity. 2. The right hepatic lobe metastatic lesion is highly indistinct and difficult to measure reliably, and accordingly I am not certain whether it has changed significantly from prior. 3. The prior acute left renal vein thrombosis has mostly cleared, with only a very thin nonocclusive band of residual filling defect indicating chronic thrombus superiorly within the vein. 4. Borderline mediastinal adenopathy. This may represent early spread to the mediastinal lymph nodes. 5. Cholelithiasis.   Electronically Signed   By: WVan ClinesM.D.   On: 05/01/2015 11:05    ASSESSMENT: 44y.o.  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  (0) genetic testing of the ATM, BARD1, BRCA1, BRCA2, BRIP1, CDH1, CHEK2, EPCAM, FANCC, MLH1, MSH2, MSH6, NBN, PALB2, PMS2, PTEN, RAD51C, RAD51D, STK11, TP53, and XRCC2 genes showed no deleterious mutations  (1) status post left breast biopsy 04/23/2013 for a clinical T4 N1, stage IIIB invasive ductal carcinoma, grade 3,  estrogen receptor 14% positive with moderate staining intensity, progesterone receptor negative,  with  an MIB-1 of 100% and HER-2/neu  nonamplified  (2) biopsy of a left axillary lymph node [phone] was positive  (3) started monthly goserelin July of 02/01/2013  (4)  received  neoadjuvant chemotherapy with doxorubicin and cyclophosphamide in dose dense fashion x4, followed by carboplatin and paclitaxel x1, discontinued due to neuropathy, followed by carboplatin and gemcitabine, both given day 1 of each 14 day cycle x6, completed 10/12/2013 .   (5) right upper extremity DVT documented 06/29/2013,  treated with Lovenox from July  through December 2014    (6) status post left modified radical mastectomy 11/15/2013 for a residual pT3 pN0 invasive ductal carcinoma, grade 3, with  repeat prognostic panel triple negative. All 12 axillary lymph nodes were clear. Margins were negative   (5) completed adjuvant radiation therapy 03/18/2014,  with capecitabine added 2-3 weeks into treatment.  METASTATIC DISEASE  (6) CT scans of the chest, abdomen, and pelvis 06/26/2014 showed a large right pleural effusion  with pleural studding, 2 left upper lobe nodules, and two  liver lesions. Cytology from right thoracentesis 07/03/2014 showed malignant cells consistent with the patient's known primary  (7) started eribulin 07/09/2014, given days 1 and 8 of each 21 day cycle; restagind studies after cycle 3 showed evidence of response. Discontinued after cycle 5 (last dose 10/08/14) because of worsening peripheral neuropathy--also with progression.   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 Antithrombin level was normal.  (12) to start liposomal doxorubicin (Doxil) 05/14/2015, canceled because of low ejection fraction  (13) started capecitabine1.5g BID, 05/20/15, 1 week on, 1 week off  PLAN:  Zanai seems to be dehydrated today, given her lack of energy, fluid intake, and heart rate. We has set her up for IV fluids tomorrow and this should make her feel better. We reviewed the results of her brain MRI which were negative for metastatic disease. Overall she is tolerating the capecitabine well and is now on her off week. She will start the next  cycle on next Tuesday.   Ethelwyn will return next week for a follow up visit with Dr. Jana Hakim. She understands and agrees with this plan. She knows the goal of treatment in her case is control. She has been encouraged to call with any issues that might arise before her next visit here.  Erika Panda, NP 05/27/2015 4:58 PM

## 2015-05-27 NOTE — Telephone Encounter (Signed)
Left message to confirm appointment for June.

## 2015-05-28 ENCOUNTER — Other Ambulatory Visit: Payer: Self-pay | Admitting: Nurse Practitioner

## 2015-05-28 ENCOUNTER — Ambulatory Visit (INDEPENDENT_AMBULATORY_CARE_PROVIDER_SITE_OTHER): Payer: Self-pay | Admitting: Pharmacist

## 2015-05-28 ENCOUNTER — Telehealth: Payer: Self-pay | Admitting: *Deleted

## 2015-05-28 ENCOUNTER — Ambulatory Visit (HOSPITAL_BASED_OUTPATIENT_CLINIC_OR_DEPARTMENT_OTHER): Payer: No Typology Code available for payment source

## 2015-05-28 VITALS — BP 96/69 | HR 121 | Temp 98.4°F | Resp 32

## 2015-05-28 DIAGNOSIS — C50212 Malignant neoplasm of upper-inner quadrant of left female breast: Secondary | ICD-10-CM | POA: Diagnosis not present

## 2015-05-28 DIAGNOSIS — E860 Dehydration: Secondary | ICD-10-CM

## 2015-05-28 DIAGNOSIS — I823 Embolism and thrombosis of renal vein: Secondary | ICD-10-CM

## 2015-05-28 LAB — POCT INR: INR: 1.8

## 2015-05-28 MED ORDER — SODIUM CHLORIDE 0.9 % IV SOLN
Freq: Once | INTRAVENOUS | Status: AC
  Administered 2015-05-28: 14:00:00 via INTRAVENOUS

## 2015-05-28 MED ORDER — SODIUM CHLORIDE 0.9 % IJ SOLN
10.0000 mL | INTRAMUSCULAR | Status: DC | PRN
Start: 2015-05-28 — End: 2015-05-28
  Administered 2015-05-28: 10 mL
  Filled 2015-05-28: qty 10

## 2015-05-28 MED ORDER — HEPARIN SOD (PORK) LOCK FLUSH 100 UNIT/ML IV SOLN
500.0000 [IU] | Freq: Once | INTRAVENOUS | Status: AC | PRN
  Administered 2015-05-28: 500 [IU]
  Filled 2015-05-28: qty 5

## 2015-05-28 NOTE — Patient Instructions (Signed)
Dehydration, Adult Dehydration is when you lose more fluids from the body than you take in. Vital organs like the kidneys, brain, and heart cannot function without a proper amount of fluids and salt. Any loss of fluids from the body can cause dehydration.  CAUSES   Vomiting.  Diarrhea.  Excessive sweating.  Excessive urine output.  Fever. SYMPTOMS  Mild dehydration  Thirst.  Dry lips.  Slightly dry mouth. Moderate dehydration  Very dry mouth.  Sunken eyes.  Skin does not bounce back quickly when lightly pinched and released.  Dark urine and decreased urine production.  Decreased tear production.  Headache. Severe dehydration  Very dry mouth.  Extreme thirst.  Rapid, weak pulse (more than 100 beats per minute at rest).  Cold hands and feet.  Not able to sweat in spite of heat and temperature.  Rapid breathing.  Blue lips.  Confusion and lethargy.  Difficulty being awakened.  Minimal urine production.  No tears. DIAGNOSIS  Your caregiver will diagnose dehydration based on your symptoms and your exam. Blood and urine tests will help confirm the diagnosis. The diagnostic evaluation should also identify the cause of dehydration. TREATMENT  Treatment of mild or moderate dehydration can often be done at home by increasing the amount of fluids that you drink. It is best to drink small amounts of fluid more often. Drinking too much at one time can make vomiting worse. Refer to the home care instructions below. Severe dehydration needs to be treated at the hospital where you will probably be given intravenous (IV) fluids that contain water and electrolytes. HOME CARE INSTRUCTIONS   Ask your caregiver about specific rehydration instructions.  Drink enough fluids to keep your urine clear or pale yellow.  Drink small amounts frequently if you have nausea and vomiting.  Eat as you normally do.  Avoid:  Foods or drinks high in sugar.  Carbonated  drinks.  Juice.  Extremely hot or cold fluids.  Drinks with caffeine.  Fatty, greasy foods.  Alcohol.  Tobacco.  Overeating.  Gelatin desserts.  Wash your hands well to avoid spreading bacteria and viruses.  Only take over-the-counter or prescription medicines for pain, discomfort, or fever as directed by your caregiver.  Ask your caregiver if you should continue all prescribed and over-the-counter medicines.  Keep all follow-up appointments with your caregiver. SEEK MEDICAL CARE IF:  You have abdominal pain and it increases or stays in one area (localizes).  You have a rash, stiff neck, or severe headache.  You are irritable, sleepy, or difficult to awaken.  You are weak, dizzy, or extremely thirsty. SEEK IMMEDIATE MEDICAL CARE IF:   You are unable to keep fluids down or you get worse despite treatment.  You have frequent episodes of vomiting or diarrhea.  You have blood or green matter (bile) in your vomit.  You have blood in your stool or your stool looks black and tarry.  You have not urinated in 6 to 8 hours, or you have only urinated a small amount of very dark urine.  You have a fever.  You faint. MAKE SURE YOU:   Understand these instructions.  Will watch your condition.  Will get help right away if you are not doing well or get worse. Document Released: 11/29/2005 Document Revised: 02/21/2012 Document Reviewed: 07/19/2011 ExitCare Patient Information 2015 [employer]. This information is not intended to replace advice given to you by your health care provider. Make sure you discuss any questions you have with your health care   provider.  

## 2015-05-28 NOTE — Progress Notes (Signed)
INR = 1.8  Goal 2-3 INR just below goal range. Patient seen in infusion area. Patient started Xeloda, 7 days on then 7 days off, on 05/20/15. Finished first week yesterday. Patient is not feeling well today; she is here for IVF. Her fiance states she has been eating even though she has not had much appetite. I would like to check her INR during the week she is on Xeloda. She will take Coumadin 5 mg today, then continue  Coumadin to 2.5 mg daily. Recheck INR on 06/05/15; lab at 11:30am, Coumadin clinic at 11:45am, and MD at 12:00.  Theone Murdoch, PharmD

## 2015-05-28 NOTE — Telephone Encounter (Signed)
Called and spoke to pt to make her aware she will be getting IVF today at 1:45p in the infusion room and told her Gerald Stabs from pharmacy will see her as well concerning her Coumadin Clinic appt. Pt verbalized understanding and said she will be here. Message to be forwarded to Gentry Fitz, NP.

## 2015-05-28 NOTE — Progress Notes (Signed)
Pt reports she does not need phenergan/zofran for nausea today.  She verbalized she would let me know if her symptoms changed and she felt it was necessary.

## 2015-06-05 ENCOUNTER — Ambulatory Visit (HOSPITAL_BASED_OUTPATIENT_CLINIC_OR_DEPARTMENT_OTHER): Payer: No Typology Code available for payment source | Admitting: Pharmacist

## 2015-06-05 ENCOUNTER — Ambulatory Visit (HOSPITAL_BASED_OUTPATIENT_CLINIC_OR_DEPARTMENT_OTHER): Payer: No Typology Code available for payment source | Admitting: Oncology

## 2015-06-05 ENCOUNTER — Other Ambulatory Visit (HOSPITAL_BASED_OUTPATIENT_CLINIC_OR_DEPARTMENT_OTHER): Payer: No Typology Code available for payment source

## 2015-06-05 ENCOUNTER — Telehealth: Payer: Self-pay | Admitting: Oncology

## 2015-06-05 VITALS — BP 95/75 | HR 127 | Temp 98.1°F | Resp 18 | Ht 60.0 in | Wt 169.4 lb

## 2015-06-05 DIAGNOSIS — I823 Embolism and thrombosis of renal vein: Secondary | ICD-10-CM

## 2015-06-05 DIAGNOSIS — C50912 Malignant neoplasm of unspecified site of left female breast: Secondary | ICD-10-CM

## 2015-06-05 DIAGNOSIS — I509 Heart failure, unspecified: Secondary | ICD-10-CM

## 2015-06-05 DIAGNOSIS — C50212 Malignant neoplasm of upper-inner quadrant of left female breast: Secondary | ICD-10-CM

## 2015-06-05 DIAGNOSIS — D6481 Anemia due to antineoplastic chemotherapy: Secondary | ICD-10-CM

## 2015-06-05 DIAGNOSIS — G620 Drug-induced polyneuropathy: Secondary | ICD-10-CM | POA: Diagnosis not present

## 2015-06-05 DIAGNOSIS — T451X5A Adverse effect of antineoplastic and immunosuppressive drugs, initial encounter: Secondary | ICD-10-CM

## 2015-06-05 LAB — CBC WITH DIFFERENTIAL/PLATELET
BASO%: 0.2 % (ref 0.0–2.0)
Basophils Absolute: 0 10*3/uL (ref 0.0–0.1)
EOS ABS: 0.2 10*3/uL (ref 0.0–0.5)
EOS%: 3.7 % (ref 0.0–7.0)
HCT: 29.4 % — ABNORMAL LOW (ref 34.8–46.6)
HGB: 9.7 g/dL — ABNORMAL LOW (ref 11.6–15.9)
LYMPH%: 23.4 % (ref 14.0–49.7)
MCH: 29.5 pg (ref 25.1–34.0)
MCHC: 33 g/dL (ref 31.5–36.0)
MCV: 89.4 fL (ref 79.5–101.0)
MONO#: 0.3 10*3/uL (ref 0.1–0.9)
MONO%: 6 % (ref 0.0–14.0)
NEUT%: 66.7 % (ref 38.4–76.8)
NEUTROS ABS: 3.8 10*3/uL (ref 1.5–6.5)
Platelets: 407 10*3/uL — ABNORMAL HIGH (ref 145–400)
RBC: 3.29 10*6/uL — AB (ref 3.70–5.45)
RDW: 19 % — AB (ref 11.2–14.5)
WBC: 5.6 10*3/uL (ref 3.9–10.3)
lymph#: 1.3 10*3/uL (ref 0.9–3.3)

## 2015-06-05 LAB — COMPREHENSIVE METABOLIC PANEL (CC13)
ALT: 10 U/L (ref 0–55)
ANION GAP: 4 meq/L (ref 3–11)
AST: 22 U/L (ref 5–34)
Albumin: 2.3 g/dL — ABNORMAL LOW (ref 3.5–5.0)
Alkaline Phosphatase: 129 U/L (ref 40–150)
BILIRUBIN TOTAL: 0.34 mg/dL (ref 0.20–1.20)
BUN: 6.6 mg/dL — AB (ref 7.0–26.0)
CO2: 30 meq/L — AB (ref 22–29)
CREATININE: 0.7 mg/dL (ref 0.6–1.1)
Calcium: 9.3 mg/dL (ref 8.4–10.4)
Chloride: 103 mEq/L (ref 98–109)
Glucose: 95 mg/dl (ref 70–140)
Potassium: 3.6 mEq/L (ref 3.5–5.1)
Sodium: 137 mEq/L (ref 136–145)
Total Protein: 8.7 g/dL — ABNORMAL HIGH (ref 6.4–8.3)

## 2015-06-05 LAB — PROTIME-INR
INR: 1.9 — AB (ref 2.00–3.50)
PROTIME: 22.8 s — AB (ref 10.6–13.4)

## 2015-06-05 LAB — POCT INR: INR: 1.9

## 2015-06-05 MED ORDER — CARVEDILOL 6.25 MG PO TABS: 6.2500 mg | ORAL_TABLET | Freq: Two times a day (BID) | ORAL | Status: DC

## 2015-06-05 MED ORDER — LISINOPRIL 10 MG PO TABS: 10.0000 mg | ORAL_TABLET | Freq: Every day | ORAL | Status: DC

## 2015-06-05 NOTE — Progress Notes (Signed)
ID: Erika [name] OB: 1971/07/09  MR#: [id number]  CSN#:[id number]  PCP: Imelda Pillow, NP GYN:   SU: Dr. [name] OTHER MD:  CHIEF COMPLAINT:  metastatic breast cancer, congestive heart failure  CURRENT TREATMENT: capecitabine  BREAST CANCER HISTORY: From Dr. [name] original intake note:  "Patient developed a left sided breast mass show up about 6 months prior to diagnosis. 2 months prior this began getting larger rapidly. She was seen in an urgent care where this mass was thought to be an abscess. Apparently there was an attempt to excise or drain this that was not successful and she was referred to Dr. [name] for evaluation. She underwent punch biopsy on 04/23/13 of her skin and the mass. She was then also sent for mm/us. The biopsy shows invasive high grade carcinoma that appears to be breast. It is ER 14%, PR negative, HER-2/neu negative with a Ki-67 that approaches 100%. She has also undergone mm with at least 10 cm upper inner left breast tumor with mild overyling skin thickening. There is also a 3x10 mm cluster of calcs near there also. There is indeterminate 7 mm level 1 left axillary node, she underwent a lymph node biopsy that was positive for disease. No mm evidence of right breast malignancy. An MRI of the breasts was done on 05/22/13 and showed a 14 cm mass in the left breast protruding from the skin and also a 2.5 cm level II axillary node."  Her subsequent history is as detailed below  INTERVAL HISTORY:   Erika [name] returns today for follow up of her breast cancer. Today is day 3 cycle 2 of capecitabine given 7 days on 7 days off. She is generally tolerating this well. In particular she has had no mouth sores. She denies diarrhea problems. She is having some peeling of the palms and soles but "I can deal with that". She is obtaining the drug and essentially no cost  REVIEW OF SYSTEMS:  Erika [name] is aware that she has developed congestive heart failure but her  functional status has not significantly changed. She does some house work, goes to [employer], and occasionally takes short walks. Her appetite is down. Her sense of taste is pretty much gone. She was not aware that she had an appointment with cardiology mid July. A detailed review of systems today was otherwise stable  PAST MEDICAL HISTORY: Past Medical History  Diagnosis Date  . Breast cancer     Invasive High Grade Carcinoma  . Headache(784.0)     Hx: of Migraines  . Status post chemotherapy     Adriamycin and Cytoxan  . Anxiety     ANXIOUS ABOUT HAVEING BIG SURGERY - MASTECTOMY  . 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 DVT (deep venous thrombosis)     RT ARM-PT ON LOVENOX  . Hx of iron deficiency anemia 11/26/13  . S/P radiation therapy 02-04-14 to 03-18-14                               1) Left Chest Wall / 50 Gy in 25 fractions/ 2) Left Supraclavicular fossa/ 46 Gy in 23 fractions / 3) Left Posterior Axillary boost / 8.533 Gy in 23 fractions/ 4) Left Chest Wall Scar boost / 10 Gy in 5 fractions    PAST SURGICAL HISTORY: Past Surgical History  Procedure Laterality Date  . Tubal ligation    . Portacath placement Right 05/22/2013  Procedure: INSERTION PORT-A-CATH;  Surgeon: [name], MD;  Location: [facility];  Service: General;  Laterality: Right;  . Left breast needle core biopsy Left 05/21/13  . Left breast needle core  biopsy Left 06/22/13  . Mastectomy modified radical Left 11/15/2013    Procedure: LEFT MASTECTOMY MODIFIED RADICAL;  Surgeon: [name], MD;  Location: [facility];  Service: General;  Laterality: Left;    FAMILY HISTORY Family History  Problem Relation Age of Onset  . Hypertension Mother   . Diabetes Father 64  . Stomach cancer Maternal Aunt   . Hypertension Maternal Grandmother   . Hypertension Maternal Grandfather   . Diabetes Paternal Grandmother   . Diabetes Paternal Grandfather   . Diabetes Paternal Uncle   . Diabetes Paternal Uncle   .  Diabetes Paternal Uncle   The patient's mother is alive, age 32. The patient's father died from complications of diabetes at the age of 25. The patient has 2 brothers, one sister. There is no history of breast or ovarian cancer in the family to her knowledge .the patient has not undergone genetic testing   GYNECOLOGIC HISTORY:  Menarche age 44, first live birth age 47. The patient is GX P3. The patient has been on goserelin since 06/13/2013, given monthly  SOCIAL HISTORY:  The patient has worked at as a Furniture conservator/restorer and a Retail buyer. She is currently applying for disability. At home she lives with her youngest son, Erika [name], 52; with the patient's brother Jodee Wagenaar; and with the patient's fiance [name].   ADVANCED DIRECTIVES: Not in place  HEALTH MAINTENANCE: History  Substance Use Topics  . Smoking status: Never Smoker   . Smokeless tobacco: Never Used  . Alcohol Use: No    Colonoscopy: Bone Density Scan:  Pap Smear:  Eye Exam:  Vitamin D Level:   Lipid Panel:    Allergies  Allergen Reactions  . Doxycycline Nausea And Vomiting    Current Outpatient Prescriptions  Medication Sig Dispense Refill  . capecitabine (XELODA) 500 MG tablet Take 3 tablets (1,500 mg total) by mouth 2 (two) times daily after a meal. 84 tablet 3  . cyclobenzaprine (FLEXERIL) 5 MG tablet TAKE 1 TABLET BY MOUTH THREE TIMES A DAY FOR MUSCLE SPASMS (Patient not taking: Reported on 05/27/2015) 30 tablet 2  . erythromycin (ROMYCIN) ophthalmic ointment Place 1 application into both eyes 4 (four) times daily. (Patient not taking: Reported on 05/27/2015) 3.5 g 0  . fentaNYL (DURAGESIC - DOSED MCG/HR) 25 MCG/HR patch Place 1 patch (25 mcg total) onto the skin every 3 (three) days. 10 patch 0  . fentaNYL (DURAGESIC - DOSED MCG/HR) 75 MCG/HR Place 1 patch (75 mcg total) onto the skin every 3 (three) days. 10 patch 0  . gabapentin (NEURONTIN) 300 MG capsule Take 1 capsule (300 mg total) by mouth 3 (three) times  daily. 90 capsule 2  . loratadine (CLARITIN) 10 MG tablet Take 10 mg by mouth daily as needed for allergies.    Marland Kitchen LORazepam (ATIVAN) 0.5 MG tablet Take 1 tablet (0.5 mg total) by mouth every 6 (six) hours as needed for anxiety. 30 tablet 0  . ondansetron (ZOFRAN) 8 MG tablet Take by mouth every 8 (eight) hours as needed for nausea or vomiting.    Marland Kitchen oxyCODONE (OXY IR/ROXICODONE) 5 MG immediate release tablet Take 1 tablet (5 mg total) by mouth every 8 (eight) hours as needed for severe pain or breakthrough pain. 90 tablet 0  . potassium chloride SA (K-DUR,KLOR-CON) 20  MEQ tablet Take 1 tablet (20 mEq total) by mouth 4 (four) times daily. 120 tablet 1  . traMADol (ULTRAM) 50 MG tablet Take 1-2 tablets (50-100 mg total) by mouth every 6 (six) hours as needed. (Patient not taking: Reported on 04/15/2015) 30 tablet 1  . warfarin (COUMADIN) 5 MG tablet Take 2.5-5 mg by mouth daily. Patient is getting regulated on Coumadin. Pt takes 2.57m daily .    . zolpidem (AMBIEN) 5 MG tablet Take 1 tablet (5 mg total) by mouth at bedtime as needed for sleep. (Patient not taking: Reported on 04/15/2015) 30 tablet 0   No current facility-administered medications for this visit.   Facility-Administered Medications Ordered in Other Visits  Medication Dose Route Frequency Provider Last Rate Last Dose  . topical emolient (BIAFINE) emulsion   Topical Daily SEppie Gibson MD        OBJECTIVE: middle-aged African American woman who appears stated age F10Vitals:   06/05/15 1243  BP: 95/75  Pulse: 127  Temp: 98.1 F (36.7 C)  Resp: 18     Body mass index is 33.08 kg/(m^2).      ECOG FS:2 - Symptomatic, <50% confined to bed   Sclerae unicteric, pupils round and equal Oropharynx clear and moist-- no thrush or other lesions No cervical or supraclavicular adenopathy Lungs no rales or rhonchi Heart rapid rate, regular rhythm Abd soft, nontender, positive bowel sounds MSK no focal spinal tenderness, no upper extremity  lymphedema Neuro: nonfocal, well oriented, appropriate affect Skin: Dryness and minimal peeling, no cracking over both palmar surfaces  LAB RESULTS:  CMP     Component Value Date/Time   NA 136 05/27/2015 1402   NA 137 02/24/2015 0624   K 3.8 05/27/2015 1402   K 4.1 02/24/2015 0624   CL 103 02/24/2015 0624   CL 102 06/01/2013 1500   CO2 27 05/27/2015 1402   CO2 26 02/24/2015 0624   GLUCOSE 112 05/27/2015 1402   GLUCOSE 94 02/24/2015 0624   GLUCOSE 95 06/01/2013 1500   BUN 11.3 05/27/2015 1402   BUN 7 02/24/2015 0624   CREATININE 0.8 05/27/2015 1402   CREATININE 0.67 02/24/2015 0624   CALCIUM 9.9 05/27/2015 1402   CALCIUM 9.2 02/24/2015 0624   PROT 9.7* 05/27/2015 1402   PROT 8.1 02/21/2015 2100   ALBUMIN 2.3* 05/27/2015 1402   ALBUMIN 3.2* 02/21/2015 2100   AST 25 05/27/2015 1402   AST 31 02/21/2015 2100   ALT 10 05/27/2015 1402   ALT 20 02/21/2015 2100   ALKPHOS 149 05/27/2015 1402   ALKPHOS 123* 02/21/2015 2100   BILITOT 0.35 05/27/2015 1402   BILITOT 0.6 02/21/2015 2100   GFRNONAA >90 02/24/2015 0624   GFRAA >90 02/24/2015 0624    I No results found for: SPEP  Lab Results  Component Value Date   WBC 5.6 06/05/2015   NEUTROABS 3.8 06/05/2015   HGB 9.7* 06/05/2015   HCT 29.4* 06/05/2015   MCV 89.4 06/05/2015   PLT 407* 06/05/2015      Chemistry      Component Value Date/Time   NA 136 05/27/2015 1402   NA 137 02/24/2015 0624   K 3.8 05/27/2015 1402   K 4.1 02/24/2015 0624   CL 103 02/24/2015 0624   CL 102 06/01/2013 1500   CO2 27 05/27/2015 1402   CO2 26 02/24/2015 0624   BUN 11.3 05/27/2015 1402   BUN 7 02/24/2015 0624   CREATININE 0.8 05/27/2015 1402   CREATININE 0.67 02/24/2015 04431  Component Value Date/Time   CALCIUM 9.9 05/27/2015 1402   CALCIUM 9.2 02/24/2015 0624   ALKPHOS 149 05/27/2015 1402   ALKPHOS 123* 02/21/2015 2100   AST 25 05/27/2015 1402   AST 31 02/21/2015 2100   ALT 10 05/27/2015 1402   ALT 20 02/21/2015 2100    BILITOT 0.35 05/27/2015 1402   BILITOT 0.6 02/21/2015 2100       Lab Results  Component Value Date   LABCA2 35 05/18/2013    No components found for: [id number]   Recent Labs Lab 06/05/15 1205  INR 1.90*    Urinalysis    Component Value Date/Time   COLORURINE YELLOW 02/24/2015 1632    STUDIES: Mr Jeri Cos VO Contrast  05-31-2015   CLINICAL DATA:  Personal history of left breast cancer. Assess for metastatic disease. Confusion and visual changes.  EXAM: MRI HEAD WITHOUT AND WITH CONTRAST  TECHNIQUE: Multiplanar, multiecho pulse sequences of the brain and surrounding structures were obtained without and with intravenous contrast.  CONTRAST:  16 cc MultiHance  COMPARISON:  07/11/2014  FINDINGS: The brain has a normal appearance on all pulse sequences without evidence of malformation, atrophy, old or acute infarction, mass lesion, hemorrhage, hydrocephalus or extra-axial collection. No pituitary mass. No fluid in the sinuses, middle ears or mastoids. No skull or skullbase lesion. There is flow in the major vessels at the base of the brain. Major venous sinuses show flow. After contrast administration, no abnormal enhancement occurs. No change since the previous study.  IMPRESSION: Normal study. No evidence of metastatic disease. No cause of the presenting symptoms is identified.   Electronically Signed   By: Nelson Chimes M.D.   On: 2015-05-31 17:25   Mr Abdomen W Wo Contrast  May 31, 2015   CLINICAL DATA:  Metastatic breast cancer. Subsequent treatment evaluation.  EXAM: MRI ABDOMEN WITHOUT AND WITH CONTRAST  TECHNIQUE: Multiplanar multisequence MR imaging of the abdomen was performed both before and after the administration of intravenous contrast.  CONTRAST:  69m MULTIHANCE GADOBENATE DIMEGLUMINE 529 MG/ML IV SOLN  COMPARISON:  CT 05/01/2015, 01/28/2015  FINDINGS: Lower chest: There is a rind of nodular pleural thickening over the right hemidiaphragm consistent with extensive pleural  metastasis.  Hepatobiliary: Lesion in the subcapsular right hepatic lobe is increased from CT 01/28/2015. The lesion on the most recent CT of 04/21/2015 is difficult to define. Lesion on current MRI measures 29 x 33 mm (image 45, series 1102) compared to 25 x 22 mm on 01/28/2015. No new lesions within the liver. Portal veins are patent.  Multiple gallstones.  Pancreas: Normal pancreatic parenchymal intensity. No ductal dilatation or inflammation.  Spleen: Normal spleen.  Adrenals/urinary tract: Adrenal glands are normal. There is a thin filling defect within the left renal vein. The renal vein is near completely recannulated.  Stomach/Bowel: Stomach and limited of the small bowel is unremarkable  Vascular/Lymphatic: Abdominal aortic normal caliber. No retroperitoneal periportal lymphadenopathy.  Musculoskeletal: No aggressive osseous lesion  IMPRESSION: 1. Thick rind of pleural metastasis over the right hemidiaphragm and along the right lung base. 2. Enlargement of the right hepatic lobe metastasis compared to CT of 01/28/2015. 3. Recanalization of the left renal vein. 4. Multiple gallstones the lumen gallbladder   Electronically Signed   By: SSuzy BouchardM.D.   On: 0June 18, 201617:16    ASSESSMENT: 44y.o.  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  (0) genetic testing of the ATM, BARD1, BRCA1, BRCA2, BRIP1, CDH1, CHEK2, EPCAM, FANCC, MLH1,  MSH2, MSH6, NBN, PALB2, PMS2, PTEN, RAD51C, RAD51D, STK11, TP53, and XRCC2 genes showed no deleterious mutations  (1) status post left breast biopsy 04/23/2013 for a clinical T4 N1, stage IIIB invasive ductal carcinoma, grade 3,  estrogen receptor 14% positive with moderate staining intensity, progesterone receptor negative,  with an MIB-1 of 100% and HER-2/neu  nonamplified  (2) biopsy of a left axillary lymph node [phone] was positive  (3) started monthly goserelin July of 02/01/2013  (4)  received  neoadjuvant chemotherapy  with doxorubicin and cyclophosphamide in dose dense fashion x4, followed by carboplatin and paclitaxel x1, discontinued due to neuropathy, followed by carboplatin and gemcitabine, both given day 1 of each 14 day cycle x6, completed 10/12/2013 .   (5) right upper extremity DVT documented 06/29/2013,  treated with Lovenox from July  through December 2014    (6) status post left modified radical mastectomy 11/15/2013 for a residual pT3 pN0 invasive ductal carcinoma, grade 3, with repeat prognostic panel triple negative. All 12 axillary lymph nodes were clear. Margins were negative   (5) completed adjuvant radiation therapy 03/18/2014,  with capecitabine added 2-3 weeks into treatment.  METASTATIC DISEASE  (6) CT scans of the chest, abdomen, and pelvis 06/26/2014 showed a large right pleural effusion  with pleural studding, 2 left upper lobe nodules, and two  liver lesions. Cytology from right thoracentesis 07/03/2014 showed malignant cells consistent with the patient's known primary  (7) started eribulin 07/09/2014, given days 1 and 8 of each 21 day cycle; restagind studies after cycle 3 showed evidence of response. Discontinued after cycle 5 (last dose 10/08/14) because of worsening peripheral neuropathy--also with progression.   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 Antithrombin level was normal.  (12) to start liposomal doxorubicin (Doxil) 05/14/2015,  canceled because of low ejection fraction  (13) started capecitabine1.5g BID, 05/20/15, 1 week on, 1 week off  (14) CHF with echo 05/21/2015 showing an ejection fraction of 25-30%  PLAN:  Jezelle is tolerating the capecitabine well, and particularly she has had no mucositis or diarrhea. She is beginning to get the palmar or plantar erythrodysesthesia problems and this may turn out to be the limiting factor.  I have set her up for visits at the beginning of each two-week cycle so we can check her counts and make sure she is tolerating it well. She is going to see me specifically August 2 and before that visit she will have a repeat MRI of the liver to assess for response.  She has an appointment with cardiology July 19. Because of her fatigue and high heart rate today I started her on Coreg and lisinopril. Her blood pressure is borderline so she will call if she has any faintness or dizziness and I will cut back those doses. Of course cardiology will adjust them at their discretion.  I am going to add some anemia labs to her first visit and replace if anything is low. If everything is adequate we will consider starting erythropoietin on an every 2 week basis.  She has a good understanding  of the overall plan. She knows the goal of treatment in her case is control. She will call with any problems that may develop before her next visit here.  [name], MD 06/05/2015 12:43 PM

## 2015-06-05 NOTE — Telephone Encounter (Signed)
Gave avs & calendar for July/August. °

## 2015-06-05 NOTE — Progress Notes (Signed)
INR right at goal today at 1.9 (goal 2-3)  Pt here to see Dr. Jana Hakim as well.  Pt is currently on Xeloda. See started her "week on" of treatment on Tuesday.  This appears to not be increasing her INR at the moment. Will keep dose the same for now No missed or extra doses No diet or medication changes. Her appetite is not "great" but see is trying to eat regularly No unusual bleeding or bruising Plan: No changes Continue  Coumadin  2.5 mg daily. Recheck INR on 06/18/15; lab at 2:15pm, Follow up with Heather at 2:45pm and Coumadin clinic at 3:15pm  Coumadin Samples Provided:  Coumadin 2.5 mg x 40 tabs  LOT: [id number] Exp: 08/2015

## 2015-06-05 NOTE — Patient Instructions (Signed)
INR right at goal No changes Continue  Coumadin  2.5 mg daily. Recheck INR on 06/17/15; lab at 9:30am, infusion at 10:30 am and we will see you in infusion

## 2015-06-11 ENCOUNTER — Encounter (HOSPITAL_COMMUNITY): Payer: Self-pay | Admitting: Emergency Medicine

## 2015-06-11 ENCOUNTER — Emergency Department (HOSPITAL_COMMUNITY)
Admission: EM | Admit: 2015-06-11 | Discharge: 2015-06-12 | Disposition: A | Discharge status: Home / Self Care | Payer: No Typology Code available for payment source | Attending: Emergency Medicine | Admitting: Emergency Medicine

## 2015-06-11 DIAGNOSIS — Z862 Personal history of diseases of the blood and blood-forming organs and certain disorders involving the immune mechanism: Secondary | ICD-10-CM | POA: Insufficient documentation

## 2015-06-11 DIAGNOSIS — Z853 Personal history of malignant neoplasm of breast: Secondary | ICD-10-CM | POA: Diagnosis not present

## 2015-06-11 DIAGNOSIS — R197 Diarrhea, unspecified: Secondary | ICD-10-CM | POA: Insufficient documentation

## 2015-06-11 DIAGNOSIS — Z792 Long term (current) use of antibiotics: Secondary | ICD-10-CM | POA: Insufficient documentation

## 2015-06-11 DIAGNOSIS — Z9012 Acquired absence of left breast and nipple: Secondary | ICD-10-CM | POA: Insufficient documentation

## 2015-06-11 DIAGNOSIS — F419 Anxiety disorder, unspecified: Secondary | ICD-10-CM | POA: Insufficient documentation

## 2015-06-11 DIAGNOSIS — R112 Nausea with vomiting, unspecified: Secondary | ICD-10-CM | POA: Insufficient documentation

## 2015-06-11 DIAGNOSIS — Z79899 Other long term (current) drug therapy: Secondary | ICD-10-CM | POA: Insufficient documentation

## 2015-06-11 DIAGNOSIS — Z7901 Long term (current) use of anticoagulants: Secondary | ICD-10-CM | POA: Insufficient documentation

## 2015-06-11 DIAGNOSIS — Z86718 Personal history of other venous thrombosis and embolism: Secondary | ICD-10-CM | POA: Insufficient documentation

## 2015-06-11 LAB — CBC WITH DIFFERENTIAL/PLATELET
Basophils Absolute: 0 10*3/uL (ref 0.0–0.1)
Basophils Relative: 0 % (ref 0–1)
Eosinophils Absolute: 0.1 10*3/uL (ref 0.0–0.7)
Eosinophils Relative: 1 % (ref 0–5)
HCT: 28.9 % — ABNORMAL LOW (ref 36.0–46.0)
Hemoglobin: 9.6 g/dL — ABNORMAL LOW (ref 12.0–15.0)
Lymphocytes Relative: 11 % — ABNORMAL LOW (ref 12–46)
Lymphs Abs: 0.7 10*3/uL (ref 0.7–4.0)
MCH: 30.3 pg (ref 26.0–34.0)
MCHC: 33.2 g/dL (ref 30.0–36.0)
MCV: 91.2 fL (ref 78.0–100.0)
Monocytes Absolute: 0.2 10*3/uL (ref 0.1–1.0)
Monocytes Relative: 3 % (ref 3–12)
Neutro Abs: 5.6 10*3/uL (ref 1.7–7.7)
Neutrophils Relative %: 85 % — ABNORMAL HIGH (ref 43–77)
Platelets: 477 10*3/uL — ABNORMAL HIGH (ref 150–400)
RBC: 3.17 MIL/uL — ABNORMAL LOW (ref 3.87–5.11)
RDW: 20.5 % — ABNORMAL HIGH (ref 11.5–15.5)
WBC: 6.5 10*3/uL (ref 4.0–10.5)

## 2015-06-11 MED ORDER — SODIUM CHLORIDE 0.9 % IV BOLUS (SEPSIS)
1000.0000 mL | Freq: Once | INTRAVENOUS | Status: AC
  Administered 2015-06-12: 1000 mL via INTRAVENOUS

## 2015-06-11 MED ORDER — SODIUM CHLORIDE 0.9 % IV SOLN
8.0000 mg | Freq: Once | INTRAVENOUS | Status: AC
  Administered 2015-06-12: 8 mg via INTRAVENOUS
  Filled 2015-06-11: qty 4

## 2015-06-11 NOTE — ED Notes (Signed)
Pt states that she has had N/V/D all day. Breast CA pt. States that she finished her week of chemo pill yesterday. Alert and oriented.

## 2015-06-12 ENCOUNTER — Other Ambulatory Visit: Payer: No Typology Code available for payment source

## 2015-06-12 ENCOUNTER — Ambulatory Visit: Payer: No Typology Code available for payment source

## 2015-06-12 LAB — COMPREHENSIVE METABOLIC PANEL
ALT: 11 U/L — ABNORMAL LOW (ref 14–54)
AST: 21 U/L (ref 15–41)
Albumin: 2.9 g/dL — ABNORMAL LOW (ref 3.5–5.0)
Alkaline Phosphatase: 109 U/L (ref 38–126)
Anion gap: 6 (ref 5–15)
BUN: 23 mg/dL — ABNORMAL HIGH (ref 6–20)
CO2: 24 mmol/L (ref 22–32)
Calcium: 9.5 mg/dL (ref 8.9–10.3)
Chloride: 103 mmol/L (ref 101–111)
Creatinine, Ser: 1.02 mg/dL — ABNORMAL HIGH (ref 0.44–1.00)
GFR calc Af Amer: >60 mL/min (ref >60)
GFR calc non Af Amer: >60 mL/min (ref >60)
Glucose, Bld: 120 mg/dL — ABNORMAL HIGH (ref 65–99)
Potassium: 4.7 mmol/L (ref 3.5–5.1)
Sodium: 133 mmol/L — ABNORMAL LOW (ref 135–145)
Total Bilirubin: 0.2 mg/dL — ABNORMAL LOW (ref 0.3–1.2)
Total Protein: 9.2 g/dL — ABNORMAL HIGH (ref 6.5–8.1)

## 2015-06-12 LAB — LIPASE, BLOOD: Lipase: 14 U/L — ABNORMAL LOW (ref 22–51)

## 2015-06-12 MED ORDER — ONDANSETRON HCL 4 MG PO TABS: 4.0000 mg | ORAL_TABLET | Freq: Four times a day (QID) | ORAL | Status: DC

## 2015-06-12 MED ORDER — SODIUM CHLORIDE 0.9 % IV BOLUS (SEPSIS)
1000.0000 mL | Freq: Once | INTRAVENOUS | Status: AC
  Administered 2015-06-12: 1000 mL via INTRAVENOUS

## 2015-06-12 MED ORDER — ONDANSETRON HCL 4 MG/2ML IJ SOLN
4.0000 mg | Freq: Once | INTRAMUSCULAR | Status: AC
  Administered 2015-06-12: 4 mg via INTRAVENOUS
  Filled 2015-06-12: qty 2

## 2015-06-12 NOTE — ED Notes (Signed)
Informed the pt that urine specimen is needed.

## 2015-06-12 NOTE — ED Notes (Signed)
Pt ambulating independently w/ steady gait on d/c in no acute distress, A&Ox4. D/c instructions reviewed w/ pt and family - pt and family deny any further questions or concerns at present. Rx given x1  

## 2015-06-12 NOTE — Discharge Instructions (Signed)

## 2015-06-13 ENCOUNTER — Other Ambulatory Visit: Payer: Self-pay | Admitting: *Deleted

## 2015-06-13 ENCOUNTER — Telehealth: Payer: Self-pay | Admitting: *Deleted

## 2015-06-13 DIAGNOSIS — C50212 Malignant neoplasm of upper-inner quadrant of left female breast: Secondary | ICD-10-CM

## 2015-06-13 MED ORDER — FENTANYL 75 MCG/HR TD PT72: 75.0000 ug | MEDICATED_PATCH | TRANSDERMAL | Status: DC

## 2015-06-13 MED ORDER — CYCLOBENZAPRINE HCL 5 MG PO TABS: ORAL_TABLET | ORAL | Status: DC

## 2015-06-13 MED ORDER — OXYCODONE HCL 5 MG PO TABS: 5.0000 mg | ORAL_TABLET | Freq: Three times a day (TID) | ORAL | Status: DC | PRN

## 2015-06-13 MED ORDER — FENTANYL 25 MCG/HR TD PT72: 25.0000 ug | MEDICATED_PATCH | TRANSDERMAL | Status: DC

## 2015-06-13 NOTE — Telephone Encounter (Signed)
Patient called requesting refill for oxycodone 5 mg, flexeril 5 mg, fentanyl 25 mcg, and fentanyl 75 mcg. Message routed to [facility].

## 2015-06-13 NOTE — ED Provider Notes (Signed)
CSN: [id number]     Arrival date & time 06/11/15  2240 History   First MD Initiated Contact with Patient 06/11/15 2310     Chief Complaint  Patient presents with  . Emesis  . Diarrhea     (Consider location/radiation/quality/duration/timing/severity/associated sxs/prior Treatment) HPI   44 year old female with nausea, vomiting or diarrhea. Symptom onset today. Persistent. Multiple episodes of vomiting and diarrhea. Diffuse, crampy abdominal pain. No fevers or chills. No urinary complaints. No sick contacts. No fever or chills.  Past Medical History  Diagnosis Date  . Breast cancer     Invasive High Grade Carcinoma  . Headache(784.0)     Hx: of Migraines  . Status post chemotherapy     Adriamycin and Cytoxan  . Anxiety     ANXIOUS ABOUT HAVEING BIG SURGERY - MASTECTOMY  . 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 DVT (deep venous thrombosis)     RT ARM-PT ON LOVENOX  . Hx of iron deficiency anemia 11/26/13  . S/P radiation therapy 02-04-14 to 03-18-14                               1) Left Chest Wall / 50 Gy in 25 fractions/ 2) Left Supraclavicular fossa/ 46 Gy in 23 fractions / 3) Left Posterior Axillary boost / 8.533 Gy in 23 fractions/ 4) Left Chest Wall Scar boost / 10 Gy in 5 fractions   Past Surgical History  Procedure Laterality Date  . Tubal ligation    . Portacath placement Right 05/22/2013    Procedure: INSERTION PORT-A-CATH;  Surgeon: [name], MD;  Location: [place];  Service: General;  Laterality: Right;  . Left breast needle core biopsy Left 05/21/13  . Left breast needle core  biopsy Left 06/22/13  . Mastectomy modified radical Left 11/15/2013    Procedure: LEFT MASTECTOMY MODIFIED RADICAL;  Surgeon: [name], MD;  Location: [facility];  Service: General;  Laterality: Left;   Family History  Problem Relation Age of Onset  . Hypertension Mother   . Diabetes Father 40  . Stomach cancer Maternal Aunt   . Hypertension Maternal Grandmother   . Hypertension  Maternal Grandfather   . Diabetes Paternal Grandmother   . Diabetes Paternal Grandfather   . Diabetes Paternal Uncle   . Diabetes Paternal Uncle   . Diabetes Paternal Uncle    History  Substance Use Topics  . Smoking status: Never Smoker   . Smokeless tobacco: Never Used  . Alcohol Use: No   OB History    No data available     Review of Systems  All systems reviewed and negative, other than as noted in HPI.   Allergies  Doxycycline  Home Medications   Prior to Admission medications   Medication Sig Start Date End Date Taking? Authorizing Provider  capecitabine (XELODA) 500 MG tablet Take 3 tablets (1,500 mg total) by mouth 2 (two) times daily after a meal. 05/18/15  Yes [name], MD  carvedilol (COREG) 6.25 MG tablet Take 1 tablet (6.25 mg total) by mouth 2 (two) times daily with a meal. 06/05/15  Yes [name], MD  cyclobenzaprine (FLEXERIL) 5 MG tablet TAKE 1 TABLET BY MOUTH THREE TIMES A DAY FOR MUSCLE SPASMS Patient taking differently: Take 5 mg by mouth 3 (three) times daily as needed for muscle spasms.  05/14/15  Yes [name], MD  erythromycin [facility]) ophthalmic ointment Place  1 application into both eyes 4 (four) times daily. 04/24/15  Yes Susanne Borders, NP  fentaNYL (DURAGESIC - DOSED MCG/HR) 25 MCG/HR patch Place 1 patch (25 mcg total) onto the skin every 3 (three) days. 05/14/15  Yes [name], MD  fentaNYL (DURAGESIC - DOSED MCG/HR) 75 MCG/HR Place 1 patch (75 mcg total) onto the skin every 3 (three) days. 05/14/15  Yes [name], MD  gabapentin (NEURONTIN) 300 MG capsule Take 1 capsule (300 mg total) by mouth 3 (three) times daily. 04/15/15  Yes [name], MD  lisinopril (PRINIVIL) 10 MG tablet Take 1 tablet (10 mg total) by mouth daily. 06/05/15  Yes [name], MD  loratadine (CLARITIN) 10 MG tablet Take 10 mg by mouth daily as needed for allergies.   Yes Historical Provider, MD  LORazepam (ATIVAN) 0.5 MG tablet Take  1 tablet (0.5 mg total) by mouth every 6 (six) hours as needed for anxiety. 05/14/15  Yes [name], MD  ondansetron (ZOFRAN) 8 MG tablet Take 8 mg by mouth every 8 (eight) hours as needed for nausea or vomiting.    Yes Historical Provider, MD  oxyCODONE (OXY IR/ROXICODONE) 5 MG immediate release tablet Take 1 tablet (5 mg total) by mouth every 8 (eight) hours as needed for severe pain or breakthrough pain. 05/14/15  Yes [name], MD  potassium chloride SA (K-DUR,KLOR-CON) 20 MEQ tablet Take 1 tablet (20 mEq total) by mouth 4 (four) times daily. Patient taking differently: Take 40 mEq by mouth 2 (two) times daily.  01/15/15  Yes [name], NP  warfarin (COUMADIN) 5 MG tablet Take 2.5 mg by mouth daily. Patient is getting regulated on Coumadin. Pt takes 2.5mg  daily .   Yes Historical Provider, MD  zolpidem (AMBIEN) 5 MG tablet Take 1 tablet (5 mg total) by mouth at bedtime as needed for sleep. 06/27/14  Yes Minette Headland, NP  ondansetron (ZOFRAN) 4 MG tablet Take 1 tablet (4 mg total) by mouth every 6 (six) hours. 06/12/15   Virgel Manifold, MD   BP 96/68 mmHg  Pulse 93  Temp(Src) 97.7 F (36.5 C) (Oral)  Resp 16  SpO2 97%  LMP 05/30/2013 Physical Exam  Constitutional: She appears well-developed and well-nourished. No distress.  HENT:  Head: Normocephalic and atraumatic.  Eyes: Conjunctivae are normal. Right eye exhibits no discharge. Left eye exhibits no discharge.  Neck: Neck supple.  Cardiovascular: Normal rate, regular rhythm and normal heart sounds.  Exam reveals no gallop and no friction rub.   No murmur heard. Pulmonary/Chest: Effort normal and breath sounds normal. No respiratory distress.  Abdominal: Soft. She exhibits no distension. There is no tenderness.  Musculoskeletal: She exhibits no edema or tenderness.  Neurological: She is alert.  Skin: Skin is warm and dry.  Psychiatric: She has a normal mood and affect. Her behavior is normal. Thought content  normal.  Nursing note and vitals reviewed.   ED Course  Procedures (including critical care time) Labs Review Labs Reviewed  CBC WITH DIFFERENTIAL/PLATELET - Abnormal; Notable for the following:    RBC 3.17 (*)    Hemoglobin 9.6 (*)    HCT 28.9 (*)    RDW 20.5 (*)    Platelets 477 (*)    Neutrophils Relative % 85 (*)    Lymphocytes Relative 11 (*)    All other components within normal limits  COMPREHENSIVE METABOLIC PANEL - Abnormal; Notable for the following:    Sodium 133 (*)  Glucose, Bld 120 (*)    BUN 23 (*)    Creatinine, Ser 1.02 (*)    Total Protein 9.2 (*)    Albumin 2.9 (*)    ALT 11 (*)    Total Bilirubin 0.2 (*)    All other components within normal limits  LIPASE, BLOOD - Abnormal; Notable for the following:    Lipase 14 (*)    All other components within normal limits    Imaging Review No results found.   EKG Interpretation None      MDM   Final diagnoses:  Nausea vomiting and diarrhea    44 year old female with nausea, vomiting and diarrhea. Her abdominal exam is benign. Afebrile. Hemodynamically stable. Symptoms not significantly improved. Feel she is stable for discharge. It has been determined that no acute conditions requiring further emergency intervention are present at this time. The patient has been advised of the diagnosis and plan. I reviewed any labs and imaging including any potential incidental findings. We have discussed signs and symptoms that warrant return to the ED and they are listed in the discharge instructions.     Virgel Manifold, MD 06/13/15 [phone]

## 2015-06-13 NOTE — Telephone Encounter (Signed)
Prescription refilled: Oxycodone/fentanyl/flexeril

## 2015-06-17 ENCOUNTER — Other Ambulatory Visit: Payer: No Typology Code available for payment source

## 2015-06-17 ENCOUNTER — Ambulatory Visit: Payer: No Typology Code available for payment source

## 2015-06-18 ENCOUNTER — Other Ambulatory Visit (HOSPITAL_COMMUNITY)
Admission: RE | Admit: 2015-06-18 | Discharge: 2015-06-18 | Disposition: A | Discharge status: Home / Self Care | Payer: No Typology Code available for payment source | Source: Ambulatory Visit | Attending: Emergency Medicine | Admitting: Emergency Medicine

## 2015-06-18 ENCOUNTER — Ambulatory Visit (HOSPITAL_BASED_OUTPATIENT_CLINIC_OR_DEPARTMENT_OTHER): Payer: No Typology Code available for payment source | Admitting: Pharmacist

## 2015-06-18 ENCOUNTER — Ambulatory Visit (HOSPITAL_COMMUNITY)
Admission: RE | Admit: 2015-06-18 | Discharge: 2015-06-18 | Disposition: A | Discharge status: Home / Self Care | Payer: No Typology Code available for payment source | Source: Ambulatory Visit | Attending: Nurse Practitioner | Admitting: Nurse Practitioner

## 2015-06-18 ENCOUNTER — Other Ambulatory Visit (HOSPITAL_BASED_OUTPATIENT_CLINIC_OR_DEPARTMENT_OTHER): Payer: No Typology Code available for payment source

## 2015-06-18 ENCOUNTER — Other Ambulatory Visit: Payer: Self-pay | Admitting: *Deleted

## 2015-06-18 ENCOUNTER — Telehealth: Payer: Self-pay | Admitting: Oncology

## 2015-06-18 ENCOUNTER — Ambulatory Visit: Payer: No Typology Code available for payment source

## 2015-06-18 ENCOUNTER — Encounter: Payer: Self-pay | Admitting: Nurse Practitioner

## 2015-06-18 ENCOUNTER — Ambulatory Visit (HOSPITAL_BASED_OUTPATIENT_CLINIC_OR_DEPARTMENT_OTHER): Payer: No Typology Code available for payment source | Admitting: Nurse Practitioner

## 2015-06-18 ENCOUNTER — Telehealth: Payer: Self-pay

## 2015-06-18 VITALS — BP 81/43 | HR 100 | Temp 98.2°F | Resp 18 | Ht 60.0 in | Wt 171.2 lb

## 2015-06-18 DIAGNOSIS — C773 Secondary and unspecified malignant neoplasm of axilla and upper limb lymph nodes: Secondary | ICD-10-CM | POA: Diagnosis not present

## 2015-06-18 DIAGNOSIS — I823 Embolism and thrombosis of renal vein: Secondary | ICD-10-CM

## 2015-06-18 DIAGNOSIS — Z86718 Personal history of other venous thrombosis and embolism: Secondary | ICD-10-CM

## 2015-06-18 DIAGNOSIS — G620 Drug-induced polyneuropathy: Secondary | ICD-10-CM

## 2015-06-18 DIAGNOSIS — Z7901 Long term (current) use of anticoagulants: Secondary | ICD-10-CM | POA: Insufficient documentation

## 2015-06-18 DIAGNOSIS — D6481 Anemia due to antineoplastic chemotherapy: Secondary | ICD-10-CM | POA: Diagnosis present

## 2015-06-18 DIAGNOSIS — R748 Abnormal levels of other serum enzymes: Secondary | ICD-10-CM

## 2015-06-18 DIAGNOSIS — Z170 Estrogen receptor positive status [ER+]: Secondary | ICD-10-CM | POA: Diagnosis not present

## 2015-06-18 DIAGNOSIS — C50212 Malignant neoplasm of upper-inner quadrant of left female breast: Secondary | ICD-10-CM

## 2015-06-18 DIAGNOSIS — T451X5A Adverse effect of antineoplastic and immunosuppressive drugs, initial encounter: Secondary | ICD-10-CM | POA: Insufficient documentation

## 2015-06-18 DIAGNOSIS — C50912 Malignant neoplasm of unspecified site of left female breast: Secondary | ICD-10-CM

## 2015-06-18 DIAGNOSIS — R7989 Other specified abnormal findings of blood chemistry: Secondary | ICD-10-CM | POA: Insufficient documentation

## 2015-06-18 LAB — COMPREHENSIVE METABOLIC PANEL (CC13)
ALBUMIN: 2.4 g/dL — AB (ref 3.5–5.0)
ALT: 8 U/L (ref 0–55)
ANION GAP: 9 meq/L (ref 3–11)
AST: 15 U/L (ref 5–34)
Alkaline Phosphatase: 112 U/L (ref 40–150)
BUN: 37.7 mg/dL — ABNORMAL HIGH (ref 7.0–26.0)
CALCIUM: 9.4 mg/dL (ref 8.4–10.4)
CO2: 23 mEq/L (ref 22–29)
Chloride: 104 mEq/L (ref 98–109)
Creatinine: 3.6 mg/dL (ref 0.6–1.1)
EGFR: 17 mL/min/{1.73_m2} — ABNORMAL LOW (ref >90)
Glucose: 98 mg/dl (ref 70–140)
POTASSIUM: 3.8 meq/L (ref 3.5–5.1)
Sodium: 136 mEq/L (ref 136–145)
TOTAL PROTEIN: 7.9 g/dL (ref 6.4–8.3)
Total Bilirubin: 0.21 mg/dL (ref 0.20–1.20)

## 2015-06-18 LAB — PROTIME-INR
INR: 4.66 — ABNORMAL HIGH (ref 0.00–1.49)
PROTHROMBIN TIME: 42.6 s — AB (ref 11.6–15.2)

## 2015-06-18 LAB — CBC WITH DIFFERENTIAL/PLATELET
BASO%: 0.4 % (ref 0.0–2.0)
BASOS ABS: 0 10*3/uL (ref 0.0–0.1)
EOS ABS: 0.3 10*3/uL (ref 0.0–0.5)
EOS%: 4.1 % (ref 0.0–7.0)
HCT: 26.9 % — ABNORMAL LOW (ref 34.8–46.6)
HGB: 8.8 g/dL — ABNORMAL LOW (ref 11.6–15.9)
LYMPH%: 22.3 % (ref 14.0–49.7)
MCH: 27.7 pg (ref 25.1–34.0)
MCHC: 32.7 g/dL (ref 31.5–36.0)
MCV: 84.9 fL (ref 79.5–101.0)
MONO#: 0.4 10*3/uL (ref 0.1–0.9)
MONO%: 6.5 % (ref 0.0–14.0)
NEUT#: 4.1 10*3/uL (ref 1.5–6.5)
NEUT%: 66.7 % (ref 38.4–76.8)
PLATELETS: 421 10*3/uL — AB (ref 145–400)
RBC: 3.16 10*6/uL — AB (ref 3.70–5.45)
RDW: 21.6 % — ABNORMAL HIGH (ref 11.2–14.5)
WBC: 6.1 10*3/uL (ref 3.9–10.3)
lymph#: 1.4 10*3/uL (ref 0.9–3.3)

## 2015-06-18 LAB — POCT INR: INR: 4.66

## 2015-06-18 LAB — ABO/RH: ABO/RH(D): A POS

## 2015-06-18 MED ORDER — ONDANSETRON HCL 8 MG PO TABS: 8.0000 mg | ORAL_TABLET | Freq: Two times a day (BID) | ORAL | Status: DC

## 2015-06-18 NOTE — Progress Notes (Signed)
ID: [name] OB: 1971-02-02  MR#: [id number]  CSN#:[id number]  PCP: Imelda Pillow, NP GYN:   SU: Dr. [name] OTHER MD:  CHIEF COMPLAINT:  metastatic breast cancer, congestive heart failure  CURRENT TREATMENT: capecitabine  BREAST CANCER HISTORY: From Dr. [name] original intake note:  "Patient developed a left sided breast mass show up about 6 months prior to diagnosis. 2 months prior this began getting larger rapidly. She was seen in an urgent care where this mass was thought to be an abscess. Apparently there was an attempt to excise or drain this that was not successful and she was referred to Dr. [name] for evaluation. She underwent punch biopsy on 04/23/13 of her skin and the mass. She was then also sent for mm/us. The biopsy shows invasive high grade carcinoma that appears to be breast. It is ER 14%, PR negative, HER-2/neu negative with a Ki-67 that approaches 100%. She has also undergone mm with at least 10 cm upper inner left breast tumor with mild overyling skin thickening. There is also a 3x10 mm cluster of calcs near there also. There is indeterminate 7 mm level 1 left axillary node, she underwent a lymph node biopsy that was positive for disease. No mm evidence of right breast malignancy. An MRI of the breasts was done on 05/22/13 and showed a 14 cm mass in the left breast protruding from the skin and also a 2.5 cm level II axillary node."  Her subsequent history is as detailed below  INTERVAL HISTORY:   Briyonna returns today for follow up of her breast cancer. Today is day 2 cycle 3 of capecitabine given 7 days on 7 days off. The interval history is remarkable for a visit to the ED on 6/30. She presented with nausea, vomiting, and diarrhea. She was given IV fluids and discharged home. A few days later the symptoms improved.   REVIEW OF SYSTEMS:  Kimmerly denies fevers or chills. She has occasional waves of nausea now, but no vomiting. She has no more diarrhea.  Her appetite is gone secondary to taste changes. Her pain is stable on narcotics. She has some fatigue, but denies shortness of breath, chest pain, cough, or palpitations. She denies mouth sores. Her palms are dry but the peeling is minimal, and there are no cracks. She sleeps well. A detailed review of systems is otherwise stable.   PAST MEDICAL HISTORY: Past Medical History  Diagnosis Date  . Breast cancer     Invasive High Grade Carcinoma  . Headache(784.0)     Hx: of Migraines  . Status post chemotherapy     Adriamycin and Cytoxan  . Anxiety     ANXIOUS ABOUT HAVEING BIG SURGERY - MASTECTOMY  . 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 DVT (deep venous thrombosis)     RT ARM-PT ON LOVENOX  . Hx of iron deficiency anemia 11/26/13  . S/P radiation therapy 02-04-14 to 03-18-14                               1) Left Chest Wall / 50 Gy in 25 fractions/ 2) Left Supraclavicular fossa/ 46 Gy in 23 fractions / 3) Left Posterior Axillary boost / 8.533 Gy in 23 fractions/ 4) Left Chest Wall Scar boost / 10 Gy in 5 fractions    PAST SURGICAL HISTORY: Past Surgical History  Procedure Laterality Date  . Tubal ligation    .  Portacath placement Right 05/22/2013    Procedure: INSERTION PORT-A-CATH;  Surgeon: [name], MD;  Location: [facility];  Service: General;  Laterality: Right;  . Left breast needle core biopsy Left 05/21/13  . Left breast needle core  biopsy Left 06/22/13  . Mastectomy modified radical Left 11/15/2013    Procedure: LEFT MASTECTOMY MODIFIED RADICAL;  Surgeon: [name], MD;  Location: [facility];  Service: General;  Laterality: Left;    FAMILY HISTORY Family History  Problem Relation Age of Onset  . Hypertension Mother   . Diabetes Father 10  . Stomach cancer Maternal Aunt   . Hypertension Maternal Grandmother   . Hypertension Maternal Grandfather   . Diabetes Paternal Grandmother   . Diabetes Paternal Grandfather   . Diabetes Paternal Uncle   . Diabetes Paternal  Uncle   . Diabetes Paternal Uncle   The patient's mother is alive, age 55. The patient's father died from complications of diabetes at the age of 52. The patient has 2 brothers, one sister. There is no history of breast or ovarian cancer in the family to her knowledge .the patient has not undergone genetic testing   GYNECOLOGIC HISTORY:  Menarche age 51, first live birth age 28. The patient is GX P3. The patient has been on goserelin since 06/13/2013, given monthly  SOCIAL HISTORY:  The patient has worked at as a Furniture conservator/restorer and a Retail buyer. She is currently applying for disability. At home she lives with her youngest son, [name], 36; with the patient's brother Kani Jobson; and with the patient's fiance [name].   ADVANCED DIRECTIVES: Not in place  HEALTH MAINTENANCE: History  Substance Use Topics  . Smoking status: Never Smoker   . Smokeless tobacco: Never Used  . Alcohol Use: No    Colonoscopy: Bone Density Scan:  Pap Smear:  Eye Exam:  Vitamin D Level:   Lipid Panel:    Allergies  Allergen Reactions  . Doxycycline Nausea And Vomiting    Current Outpatient Prescriptions  Medication Sig Dispense Refill  . capecitabine (XELODA) 500 MG tablet Take 3 tablets (1,500 mg total) by mouth 2 (two) times daily after a meal. 84 tablet 3  . carvedilol (COREG) 6.25 MG tablet Take 1 tablet (6.25 mg total) by mouth 2 (two) times daily with a meal. 60 tablet 6  . cyclobenzaprine (FLEXERIL) 5 MG tablet TAKE 1 TABLET BY MOUTH THREE TIMES A DAY FOR MUSCLE SPASMS 30 tablet 2  . fentaNYL (DURAGESIC - DOSED MCG/HR) 25 MCG/HR patch Place 1 patch (25 mcg total) onto the skin every 3 (three) days. 10 patch 0  . fentaNYL (DURAGESIC - DOSED MCG/HR) 75 MCG/HR Place 1 patch (75 mcg total) onto the skin every 3 (three) days. 10 patch 0  . gabapentin (NEURONTIN) 300 MG capsule Take 1 capsule (300 mg total) by mouth 3 (three) times daily. 90 capsule 2  . lisinopril (PRINIVIL) 10 MG tablet Take 1  tablet (10 mg total) by mouth daily. 30 tablet 6  . loratadine (CLARITIN) 10 MG tablet Take 10 mg by mouth daily as needed for allergies.    Marland Kitchen LORazepam (ATIVAN) 0.5 MG tablet Take 1 tablet (0.5 mg total) by mouth every 6 (six) hours as needed for anxiety. 30 tablet 0  . oxyCODONE (OXY IR/ROXICODONE) 5 MG immediate release tablet Take 1 tablet (5 mg total) by mouth every 8 (eight) hours as needed for severe pain or breakthrough pain. 90 tablet 0  . potassium chloride SA (K-DUR,KLOR-CON) 20 MEQ tablet  Take 1 tablet (20 mEq total) by mouth 4 (four) times daily. (Patient taking differently: Take 40 mEq by mouth 2 (two) times daily. ) 120 tablet 1  . warfarin (COUMADIN) 5 MG tablet Take 2.5 mg by mouth daily. Patient is getting regulated on Coumadin. Pt takes 2.19m daily .    . erythromycin ([facility] ophthalmic ointment Place 1 application into both eyes 4 (four) times daily. (Patient not taking: Reported on 06/18/2015) 3.5 g 0  . ondansetron (ZOFRAN) 8 MG tablet Take 1 tablet (8 mg total) by mouth 2 (two) times daily. 20 tablet 1  . zolpidem (AMBIEN) 5 MG tablet Take 1 tablet (5 mg total) by mouth at bedtime as needed for sleep. (Patient not taking: Reported on 06/18/2015) 30 tablet 0   No current facility-administered medications for this visit.   Facility-Administered Medications Ordered in Other Visits  Medication Dose Route Frequency Provider Last Rate Last Dose  . topical emolient (BIAFINE) emulsion   Topical Daily SEppie Gibson MD        OBJECTIVE: middle-aged African American woman who appears stated age F9Vitals:   06/18/15 1458  BP: 81/43  Pulse: 100  Temp: 98.2 F (36.8 C)  Resp: 18     Body mass index is 33.44 kg/(m^2).      ECOG FS:2 - Symptomatic, <50% confined to bed   Skin: warm, excessively dry palms, but no cracking.  HEENT: sclerae anicteric, conjunctivae pink, oropharynx clear. No thrush or mucositis.  Lymph Nodes: No cervical or supraclavicular lymphadenopathy  Lungs:  clear to auscultation bilaterally, no rales, wheezes, or rhonci  Heart: regular rate and rhythm  Abdomen: round, soft, non tender, positive bowel sounds  Musculoskeletal: No focal spinal tenderness, no peripheral edema  Neuro: non focal, well oriented, positive affect  Breasts: deferred  LAB RESULTS:  CMP     Component Value Date/Time   NA 133* 06/11/2015 2304   NA 137 06/05/2015 1207   K 4.7 06/11/2015 2304   K 3.6 06/05/2015 1207   CL 103 06/11/2015 2304   CL 102 06/01/2013 1500   CO2 24 06/11/2015 2304   CO2 30* 06/05/2015 1207   GLUCOSE 120* 06/11/2015 2304   GLUCOSE 95 06/05/2015 1207   GLUCOSE 95 06/01/2013 1500   BUN 23* 06/11/2015 2304   BUN 6.6* 06/05/2015 1207   CREATININE 1.02* 06/11/2015 2304   CREATININE 0.7 06/05/2015 1207   CALCIUM 9.5 06/11/2015 2304   CALCIUM 9.3 06/05/2015 1207   PROT 9.2* 06/11/2015 2304   PROT 8.7* 06/05/2015 1207   ALBUMIN 2.9* 06/11/2015 2304   ALBUMIN 2.3* 06/05/2015 1207   AST 21 06/11/2015 2304   AST 22 06/05/2015 1207   ALT 11* 06/11/2015 2304   ALT 10 06/05/2015 1207   ALKPHOS 109 06/11/2015 2304   ALKPHOS 129 06/05/2015 1207   BILITOT 0.2* 06/11/2015 2304   BILITOT 0.34 06/05/2015 1207   GFRNONAA >60 06/11/2015 2304   GFRAA >60 06/11/2015 2304    I No results found for: SPEP  Lab Results  Component Value Date   WBC 6.1 06/18/2015   NEUTROABS 4.1 06/18/2015   HGB 8.8* 06/18/2015   HCT 26.9* 06/18/2015   MCV 84.9 06/18/2015   PLT 421* 06/18/2015      Chemistry      Component Value Date/Time   NA 133* 06/11/2015 2304   NA 137 06/05/2015 1207   K 4.7 06/11/2015 2304   K 3.6 06/05/2015 1207   CL 103 06/11/2015 2304   CL 102  06/01/2013 1500   CO2 24 06/11/2015 2304   CO2 30* 06/05/2015 1207   BUN 23* 06/11/2015 2304   BUN 6.6* 06/05/2015 1207   CREATININE 1.02* 06/11/2015 2304   CREATININE 0.7 06/05/2015 1207      Component Value Date/Time   CALCIUM 9.5 06/11/2015 2304   CALCIUM 9.3 06/05/2015 1207    ALKPHOS 109 06/11/2015 2304   ALKPHOS 129 06/05/2015 1207   AST 21 06/11/2015 2304   AST 22 06/05/2015 1207   ALT 11* 06/11/2015 2304   ALT 10 06/05/2015 1207   BILITOT 0.2* 06/11/2015 2304   BILITOT 0.34 06/05/2015 1207       Lab Results  Component Value Date   LABCA2 35 05/18/2013    No components found for: LABCA125   Recent Labs Lab 06/18/15 1456  INR 4.66*    Urinalysis    Component Value Date/Time   COLORURINE YELLOW 02/24/2015 1632    STUDIES: Mr Jeri Cos Wo Contrast  05/26/15   CLINICAL DATA:  Personal history of left breast cancer. Assess for metastatic disease. Confusion and visual changes.  EXAM: MRI HEAD WITHOUT AND WITH CONTRAST  TECHNIQUE: Multiplanar, multiecho pulse sequences of the brain and surrounding structures were obtained without and with intravenous contrast.  CONTRAST:  16 cc MultiHance  COMPARISON:  07/11/2014  FINDINGS: The brain has a normal appearance on all pulse sequences without evidence of malformation, atrophy, old or acute infarction, mass lesion, hemorrhage, hydrocephalus or extra-axial collection. No pituitary mass. No fluid in the sinuses, middle ears or mastoids. No skull or skullbase lesion. There is flow in the major vessels at the base of the brain. Major venous sinuses show flow. After contrast administration, no abnormal enhancement occurs. No change since the previous study.  IMPRESSION: Normal study. No evidence of metastatic disease. No cause of the presenting symptoms is identified.   Electronically Signed   By: Nelson Chimes M.D.   On: 05/26/2015 17:25   Mr Abdomen W Wo Contrast  26-May-2015   CLINICAL DATA:  Metastatic breast cancer. Subsequent treatment evaluation.  EXAM: MRI ABDOMEN WITHOUT AND WITH CONTRAST  TECHNIQUE: Multiplanar multisequence MR imaging of the abdomen was performed both before and after the administration of intravenous contrast.  CONTRAST:  47m MULTIHANCE GADOBENATE DIMEGLUMINE 529 MG/ML IV SOLN  COMPARISON:   CT 05/01/2015, 01/28/2015  FINDINGS: Lower chest: There is a rind of nodular pleural thickening over the right hemidiaphragm consistent with extensive pleural metastasis.  Hepatobiliary: Lesion in the subcapsular right hepatic lobe is increased from CT 01/28/2015. The lesion on the most recent CT of 04/21/2015 is difficult to define. Lesion on current MRI measures 29 x 33 mm (image 45, series 1102) compared to 25 x 22 mm on 01/28/2015. No new lesions within the liver. Portal veins are patent.  Multiple gallstones.  Pancreas: Normal pancreatic parenchymal intensity. No ductal dilatation or inflammation.  Spleen: Normal spleen.  Adrenals/urinary tract: Adrenal glands are normal. There is a thin filling defect within the left renal vein. The renal vein is near completely recannulated.  Stomach/Bowel: Stomach and limited of the small bowel is unremarkable  Vascular/Lymphatic: Abdominal aortic normal caliber. No retroperitoneal periportal lymphadenopathy.  Musculoskeletal: No aggressive osseous lesion  IMPRESSION: 1. Thick rind of pleural metastasis over the right hemidiaphragm and along the right lung base. 2. Enlargement of the right hepatic lobe metastasis compared to CT of 01/28/2015. 3. Recanalization of the left renal vein. 4. Multiple gallstones the lumen gallbladder   Electronically Signed   By:  Suzy Bouchard M.D.   On: 05/23/2015 17:16    ASSESSMENT: 44 y.o.  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  (0) genetic testing of the ATM, BARD1, BRCA1, BRCA2, BRIP1, CDH1, CHEK2, EPCAM, FANCC, MLH1, MSH2, MSH6, NBN, PALB2, PMS2, PTEN, RAD51C, RAD51D, STK11, TP53, and XRCC2 genes showed no deleterious mutations  (1) status post left breast biopsy 04/23/2013 for a clinical T4 N1, stage IIIB invasive ductal carcinoma, grade 3,  estrogen receptor 14% positive with moderate staining intensity, progesterone receptor negative,  with an MIB-1 of 100% and HER-2/neu   nonamplified  (2) biopsy of a left axillary lymph node [phone] was positive  (3) started monthly goserelin July of 02/01/2013  (4)  received  neoadjuvant chemotherapy with doxorubicin and cyclophosphamide in dose dense fashion x4, followed by carboplatin and paclitaxel x1, discontinued due to neuropathy, followed by carboplatin and gemcitabine, both given day 1 of each 14 day cycle x6, completed 10/12/2013 .   (5) right upper extremity DVT documented 06/29/2013,  treated with Lovenox from July  through December 2014    (6) status post left modified radical mastectomy 11/15/2013 for a residual pT3 pN0 invasive ductal carcinoma, grade 3, with repeat prognostic panel triple negative. All 12 axillary lymph nodes were clear. Margins were negative   (5) completed adjuvant radiation therapy 03/18/2014,  with capecitabine added 2-3 weeks into treatment.  METASTATIC DISEASE  (6) CT scans of the chest, abdomen, and pelvis 06/26/2014 showed a large right pleural effusion  with pleural studding, 2 left upper lobe nodules, and two  liver lesions. Cytology from right thoracentesis 07/03/2014 showed malignant cells consistent with the patient's known primary  (7) started eribulin 07/09/2014, given days 1 and 8 of each 21 day cycle; restagind studies after cycle 3 showed evidence of response. Discontinued after cycle 5 (last dose 10/08/14) because of worsening peripheral neuropathy--also with progression.   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 Antithrombin level was normal.  (12) to start liposomal doxorubicin (Doxil) 05/14/2015, canceled because of low ejection fraction  (13) started capecitabine1.5g BID, 05/20/15, 1 week on, 1 week off  (14) CHF with echo 05/21/2015 showing an ejection fraction of 25-30%  PLAN:  Lynee has improved since cycle 2 and her ED visit. Her hgb however has dropped to 8.8. Given her low blood pressure and dizziness Dr. Jana Hakim suggests 2 units of blood this week. We will begin aranesp every 2 weeks with her next visit on 7/19. She meet with cardiology on that same day, and they will make the necessary adjustments to her cardiac meds.   Her creatinine returned after the visit was completed, critically high at 3.6.  It is possible dehydration is playing a part in this since her trip to the ED. She may be especially sensitive to fluid volume changes.I have advised the patient of Dr. Virgie Dad instructions to drink an entire quart of water tonight. We will recheck labs in the labs in the morning. If her creatinine has not improved, she may need an admission to run further  tests.  I refilled her zofran. She uses it infrequently, but needs to have some on hand.   Edmund will return in 2 weeks for labs and a follow up visit. She understands and agrees with this plan. She knows the goal of treatment in her case is control. She has been encouraged to call with any issues that might arise before her next visit here.    [name], NP 06/18/2015 3:48 PM

## 2015-06-18 NOTE — Patient Instructions (Signed)
INR above goal Hold coumadin today and tomorrow.  Then take half tablet this week (1.25 mg).  Starting next week decrease dose to 2.5 mg on MWF and 1.25mg  (half tablet) all other days.  Recheck INR on 06/24/15; lab at 2pm, 2:15pm for coumadin clinic

## 2015-06-18 NOTE — Progress Notes (Signed)
INR above goal today at 4.66 Hbg 8.8 and Ms. Kelson had some dizziness and low BP when seen by Nira Conn today Pt will receive 2 units of PRBCs tomorrow 7/7 No bruising or bleeding reported Pt started xeloda for 7 days last night I think we are finally seeing the effects on her INR from the xeloda as she has been on this a few weeks now No missed or extra doses No diet changes Pt will also be starting aranesp every 2 weeks.  Plan: Hold coumadin today and tomorrow.  Then take half tablet this week (1.25 mg).  Starting next week decrease dose to 2.5 mg on MWF and 1.25mg  (half tablet) all other days.  Recheck INR on 06/24/15; lab at 2pm, 2:15pm for coumadin clinic

## 2015-06-18 NOTE — Telephone Encounter (Signed)
Pt called requesting Rx for xeloda be prepared for pickup at her appt w/Heather today at 1445.

## 2015-06-18 NOTE — Telephone Encounter (Signed)
Appointments made and avs printed for patient °

## 2015-06-19 ENCOUNTER — Other Ambulatory Visit: Payer: Self-pay | Admitting: Nurse Practitioner

## 2015-06-19 ENCOUNTER — Ambulatory Visit (HOSPITAL_COMMUNITY)
Admission: RE | Admit: 2015-06-19 | Discharge: 2015-06-19 | Disposition: A | Discharge status: Home / Self Care | Payer: No Typology Code available for payment source | Source: Ambulatory Visit | Attending: Oncology | Admitting: Oncology

## 2015-06-19 ENCOUNTER — Ambulatory Visit (HOSPITAL_BASED_OUTPATIENT_CLINIC_OR_DEPARTMENT_OTHER): Payer: No Typology Code available for payment source

## 2015-06-19 ENCOUNTER — Other Ambulatory Visit: Payer: Self-pay | Admitting: *Deleted

## 2015-06-19 ENCOUNTER — Other Ambulatory Visit: Payer: No Typology Code available for payment source

## 2015-06-19 VITALS — BP 91/61 | HR 74 | Temp 98.0°F | Resp 20

## 2015-06-19 DIAGNOSIS — D6481 Anemia due to antineoplastic chemotherapy: Secondary | ICD-10-CM | POA: Diagnosis not present

## 2015-06-19 DIAGNOSIS — Z452 Encounter for adjustment and management of vascular access device: Secondary | ICD-10-CM | POA: Diagnosis not present

## 2015-06-19 DIAGNOSIS — C50212 Malignant neoplasm of upper-inner quadrant of left female breast: Secondary | ICD-10-CM | POA: Diagnosis not present

## 2015-06-19 DIAGNOSIS — Z95828 Presence of other vascular implants and grafts: Secondary | ICD-10-CM

## 2015-06-19 DIAGNOSIS — T451X5A Adverse effect of antineoplastic and immunosuppressive drugs, initial encounter: Principal | ICD-10-CM

## 2015-06-19 LAB — BASIC METABOLIC PANEL (CC13)
Anion Gap: 8 mEq/L (ref 3–11)
BUN: 30.6 mg/dL — AB (ref 7.0–26.0)
CHLORIDE: 106 meq/L (ref 98–109)
CO2: 22 mEq/L (ref 22–29)
CREATININE: 1.7 mg/dL — AB (ref 0.6–1.1)
Calcium: 9.7 mg/dL (ref 8.4–10.4)
EGFR: 41 mL/min/{1.73_m2} — ABNORMAL LOW (ref >90)
Glucose: 97 mg/dl (ref 70–140)
Potassium: 4.1 mEq/L (ref 3.5–5.1)
Sodium: 136 mEq/L (ref 136–145)

## 2015-06-19 LAB — PREPARE RBC (CROSSMATCH)

## 2015-06-19 MED ORDER — ACETAMINOPHEN 325 MG PO TABS
650.0000 mg | ORAL_TABLET | Freq: Once | ORAL | Status: AC
  Administered 2015-06-19: 650 mg via ORAL
  Filled 2015-06-19: qty 2

## 2015-06-19 MED ORDER — SODIUM CHLORIDE 0.9 % IJ SOLN
10.0000 mL | INTRAMUSCULAR | Status: AC | PRN
  Administered 2015-06-19: 10 mL

## 2015-06-19 MED ORDER — DIPHENHYDRAMINE HCL 25 MG PO CAPS
25.0000 mg | ORAL_CAPSULE | Freq: Once | ORAL | Status: AC
  Administered 2015-06-19: 25 mg via ORAL
  Filled 2015-06-19: qty 1

## 2015-06-19 MED ORDER — HEPARIN SOD (PORK) LOCK FLUSH 100 UNIT/ML IV SOLN
500.0000 [IU] | Freq: Every day | INTRAVENOUS | Status: AC | PRN
  Administered 2015-06-19: 500 [IU]

## 2015-06-19 MED ORDER — SODIUM CHLORIDE 0.9 % IV SOLN
250.0000 mL | Freq: Once | INTRAVENOUS | Status: AC
  Administered 2015-06-19: 250 mL via INTRAVENOUS

## 2015-06-19 MED ORDER — SODIUM CHLORIDE 0.9 % IJ SOLN: 3.0000 mL | INTRAMUSCULAR | Status: DC | PRN

## 2015-06-19 MED ORDER — HEPARIN SOD (PORK) LOCK FLUSH 100 UNIT/ML IV SOLN: 250.0000 [IU] | INTRAVENOUS | Status: DC | PRN

## 2015-06-19 MED ORDER — SODIUM CHLORIDE 0.9 % IJ SOLN
10.0000 mL | INTRAMUSCULAR | Status: DC | PRN
  Administered 2015-06-19: 10 mL via INTRAVENOUS
  Filled 2015-06-19: qty 10

## 2015-06-19 NOTE — Patient Instructions (Signed)

## 2015-06-19 NOTE — Progress Notes (Signed)
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 Jana Hakim  Procedure :Pt here to be transfused 2 units of blood.  Condition during procedure: Pt remains alert and oriented. B/P remains low through out transfusions. Pt denies discomfort, dizziness, nausea or vomiting. Other v/s remain stable.  Condition after procedure: Pt remains alert and oriented. Dr. Virgie Dad office notified of pt's v/s, spoke with Mateo Flow. Fluids disconnected and porta cath left accessed but flushed per protocal. Pt ambulatory to Dr. Virgie Dad office.

## 2015-06-20 LAB — TYPE AND SCREEN
ABO/RH(D): A POS
Antibody Screen: NEGATIVE
Unit division: 0
Unit division: 0

## 2015-06-24 ENCOUNTER — Other Ambulatory Visit (HOSPITAL_BASED_OUTPATIENT_CLINIC_OR_DEPARTMENT_OTHER): Payer: No Typology Code available for payment source

## 2015-06-24 ENCOUNTER — Ambulatory Visit (HOSPITAL_BASED_OUTPATIENT_CLINIC_OR_DEPARTMENT_OTHER): Payer: No Typology Code available for payment source | Admitting: Pharmacist

## 2015-06-24 DIAGNOSIS — D6481 Anemia due to antineoplastic chemotherapy: Secondary | ICD-10-CM

## 2015-06-24 DIAGNOSIS — T451X5A Adverse effect of antineoplastic and immunosuppressive drugs, initial encounter: Secondary | ICD-10-CM

## 2015-06-24 DIAGNOSIS — I823 Embolism and thrombosis of renal vein: Secondary | ICD-10-CM

## 2015-06-24 DIAGNOSIS — C50212 Malignant neoplasm of upper-inner quadrant of left female breast: Secondary | ICD-10-CM

## 2015-06-24 DIAGNOSIS — Z86718 Personal history of other venous thrombosis and embolism: Secondary | ICD-10-CM

## 2015-06-24 DIAGNOSIS — C50912 Malignant neoplasm of unspecified site of left female breast: Secondary | ICD-10-CM

## 2015-06-24 LAB — COMPREHENSIVE METABOLIC PANEL (CC13)
ALK PHOS: 115 U/L (ref 40–150)
ALT: 8 U/L (ref 0–55)
AST: 15 U/L (ref 5–34)
Albumin: 2.5 g/dL — ABNORMAL LOW (ref 3.5–5.0)
Anion Gap: 8 mEq/L (ref 3–11)
BUN: 12.5 mg/dL (ref 7.0–26.0)
CO2: 26 mEq/L (ref 22–29)
Calcium: 9.8 mg/dL (ref 8.4–10.4)
Chloride: 105 mEq/L (ref 98–109)
Creatinine: 1.1 mg/dL (ref 0.6–1.1)
EGFR: 74 mL/min/{1.73_m2} — AB (ref >90)
Glucose: 108 mg/dl (ref 70–140)
POTASSIUM: 3.7 meq/L (ref 3.5–5.1)
SODIUM: 139 meq/L (ref 136–145)
TOTAL PROTEIN: 8.7 g/dL — AB (ref 6.4–8.3)
Total Bilirubin: 0.39 mg/dL (ref 0.20–1.20)

## 2015-06-24 LAB — CBC WITH DIFFERENTIAL/PLATELET
BASO%: 0.3 % (ref 0.0–2.0)
Basophils Absolute: 0 10*3/uL (ref 0.0–0.1)
EOS ABS: 0.1 10*3/uL (ref 0.0–0.5)
EOS%: 1.8 % (ref 0.0–7.0)
HEMATOCRIT: 36.6 % (ref 34.8–46.6)
HEMOGLOBIN: 11.8 g/dL (ref 11.6–15.9)
LYMPH#: 1.3 10*3/uL (ref 0.9–3.3)
LYMPH%: 20.7 % (ref 14.0–49.7)
MCH: 27.4 pg (ref 25.1–34.0)
MCHC: 32.2 g/dL (ref 31.5–36.0)
MCV: 85.3 fL (ref 79.5–101.0)
MONO#: 0.3 10*3/uL (ref 0.1–0.9)
MONO%: 4.2 % (ref 0.0–14.0)
NEUT#: 4.6 10*3/uL (ref 1.5–6.5)
NEUT%: 73 % (ref 38.4–76.8)
PLATELETS: 329 10*3/uL (ref 145–400)
RBC: 4.3 10*6/uL (ref 3.70–5.45)
RDW: 19.7 % — AB (ref 11.2–14.5)
WBC: 6.3 10*3/uL (ref 3.9–10.3)

## 2015-06-24 LAB — PROTIME-INR
INR: 4.7 — ABNORMAL HIGH (ref 2.00–3.50)
Protime: 56.4 Seconds — ABNORMAL HIGH (ref 10.6–13.4)

## 2015-06-24 LAB — POCT INR: INR: 4.7

## 2015-06-24 NOTE — Progress Notes (Signed)
INR = 4.7 (goal INR=2-3)  No bleeding or bruising.  Erika [name] last day of Xeloda is today for this cycle.  Will hold coumadin today and tomorrow and resume at 2.5mg  MWF and 1.25mg  other days.  Erika [name] will return to coumadin clinic in 1 week.  This will be when she starts her next cycle of Xeloda and will adjust coumadin dose at that time.

## 2015-06-27 ENCOUNTER — Telehealth: Payer: Self-pay | Admitting: *Deleted

## 2015-06-27 NOTE — Telephone Encounter (Signed)
TC from patient stating she got a message from [place] regarding getting IVF's today.  Pt wasn't aware of that appt.  Reviewed pt's chart. She is not scheduled for IVFs today-that message was from June 15th, not July. Assured pt she is ok. Her next appt is scheduled for 07/01/15. No other needs identified.

## 2015-06-30 ENCOUNTER — Other Ambulatory Visit: Payer: Self-pay | Admitting: Nurse Practitioner

## 2015-07-01 ENCOUNTER — Other Ambulatory Visit (HOSPITAL_BASED_OUTPATIENT_CLINIC_OR_DEPARTMENT_OTHER): Payer: No Typology Code available for payment source

## 2015-07-01 ENCOUNTER — Other Ambulatory Visit (HOSPITAL_COMMUNITY)
Admission: RE | Admit: 2015-07-01 | Discharge: 2015-07-01 | Disposition: A | Discharge status: Home / Self Care | Payer: No Typology Code available for payment source | Source: Ambulatory Visit | Attending: Oncology | Admitting: Oncology

## 2015-07-01 ENCOUNTER — Other Ambulatory Visit: Payer: Self-pay | Admitting: Nurse Practitioner

## 2015-07-01 ENCOUNTER — Ambulatory Visit (HOSPITAL_BASED_OUTPATIENT_CLINIC_OR_DEPARTMENT_OTHER): Payer: Self-pay | Admitting: Pharmacist

## 2015-07-01 ENCOUNTER — Other Ambulatory Visit: Payer: Self-pay | Admitting: *Deleted

## 2015-07-01 ENCOUNTER — Encounter (HOSPITAL_COMMUNITY): Payer: Self-pay

## 2015-07-01 ENCOUNTER — Ambulatory Visit (HOSPITAL_BASED_OUTPATIENT_CLINIC_OR_DEPARTMENT_OTHER): Payer: No Typology Code available for payment source | Admitting: Nurse Practitioner

## 2015-07-01 ENCOUNTER — Encounter: Payer: Self-pay | Admitting: Nurse Practitioner

## 2015-07-01 ENCOUNTER — Ambulatory Visit: Payer: No Typology Code available for payment source

## 2015-07-01 ENCOUNTER — Telehealth (HOSPITAL_COMMUNITY): Payer: Self-pay | Admitting: Vascular Surgery

## 2015-07-01 ENCOUNTER — Ambulatory Visit (HOSPITAL_COMMUNITY)
Admission: RE | Admit: 2015-07-01 | Discharge: 2015-07-01 | Disposition: A | Discharge status: Home / Self Care | Payer: No Typology Code available for payment source | Source: Ambulatory Visit | Attending: Internal Medicine | Admitting: Internal Medicine

## 2015-07-01 VITALS — BP 86/63 | HR 93 | Resp 20 | Ht 60.0 in | Wt 168.3 lb

## 2015-07-01 VITALS — BP 106/64 | HR 100 | Wt 169.8 lb

## 2015-07-01 DIAGNOSIS — I823 Embolism and thrombosis of renal vein: Secondary | ICD-10-CM

## 2015-07-01 DIAGNOSIS — I82621 Acute embolism and thrombosis of deep veins of right upper extremity: Secondary | ICD-10-CM | POA: Diagnosis not present

## 2015-07-01 DIAGNOSIS — D6481 Anemia due to antineoplastic chemotherapy: Secondary | ICD-10-CM

## 2015-07-01 DIAGNOSIS — T451X5A Adverse effect of antineoplastic and immunosuppressive drugs, initial encounter: Secondary | ICD-10-CM | POA: Diagnosis not present

## 2015-07-01 DIAGNOSIS — G629 Polyneuropathy, unspecified: Secondary | ICD-10-CM | POA: Diagnosis not present

## 2015-07-01 DIAGNOSIS — Z7901 Long term (current) use of anticoagulants: Secondary | ICD-10-CM

## 2015-07-01 DIAGNOSIS — I5020 Unspecified systolic (congestive) heart failure: Secondary | ICD-10-CM | POA: Insufficient documentation

## 2015-07-01 DIAGNOSIS — C50919 Malignant neoplasm of unspecified site of unspecified female breast: Secondary | ICD-10-CM | POA: Insufficient documentation

## 2015-07-01 DIAGNOSIS — C50212 Malignant neoplasm of upper-inner quadrant of left female breast: Secondary | ICD-10-CM

## 2015-07-01 DIAGNOSIS — Z86718 Personal history of other venous thrombosis and embolism: Secondary | ICD-10-CM | POA: Diagnosis not present

## 2015-07-01 DIAGNOSIS — C50912 Malignant neoplasm of unspecified site of left female breast: Secondary | ICD-10-CM

## 2015-07-01 DIAGNOSIS — I427 Cardiomyopathy due to drug and external agent: Secondary | ICD-10-CM | POA: Diagnosis not present

## 2015-07-01 DIAGNOSIS — E860 Dehydration: Secondary | ICD-10-CM

## 2015-07-01 DIAGNOSIS — Z79899 Other long term (current) drug therapy: Secondary | ICD-10-CM | POA: Insufficient documentation

## 2015-07-01 DIAGNOSIS — F419 Anxiety disorder, unspecified: Secondary | ICD-10-CM | POA: Insufficient documentation

## 2015-07-01 DIAGNOSIS — G893 Neoplasm related pain (acute) (chronic): Secondary | ICD-10-CM

## 2015-07-01 DIAGNOSIS — E876 Hypokalemia: Secondary | ICD-10-CM

## 2015-07-01 LAB — CBC WITH DIFFERENTIAL/PLATELET
BASO%: 0.3 % (ref 0.0–2.0)
Basophils Absolute: 0 10*3/uL (ref 0.0–0.1)
EOS ABS: 0.1 10*3/uL (ref 0.0–0.5)
EOS%: 2 % (ref 0.0–7.0)
HEMATOCRIT: 35.9 % (ref 34.8–46.6)
HGB: 11.5 g/dL — ABNORMAL LOW (ref 11.6–15.9)
LYMPH#: 1.4 10*3/uL (ref 0.9–3.3)
LYMPH%: 19.1 % (ref 14.0–49.7)
MCH: 27.4 pg (ref 25.1–34.0)
MCHC: 32.1 g/dL (ref 31.5–36.0)
MCV: 85.4 fL (ref 79.5–101.0)
MONO#: 0.4 10*3/uL (ref 0.1–0.9)
MONO%: 5.8 % (ref 0.0–14.0)
NEUT#: 5.3 10*3/uL (ref 1.5–6.5)
NEUT%: 72.8 % (ref 38.4–76.8)
Platelets: 319 10*3/uL (ref 145–400)
RBC: 4.21 10*6/uL (ref 3.70–5.45)
RDW: 19.3 % — AB (ref 11.2–14.5)
WBC: 7.3 10*3/uL (ref 3.9–10.3)

## 2015-07-01 LAB — COMPREHENSIVE METABOLIC PANEL (CC13)
ALBUMIN: 2.8 g/dL — AB (ref 3.5–5.0)
ALT: 8 U/L (ref 0–55)
AST: 18 U/L (ref 5–34)
Alkaline Phosphatase: 116 U/L (ref 40–150)
Anion Gap: 6 mEq/L (ref 3–11)
BUN: 9.8 mg/dL (ref 7.0–26.0)
CALCIUM: 9.9 mg/dL (ref 8.4–10.4)
CO2: 28 mEq/L (ref 22–29)
Chloride: 104 mEq/L (ref 98–109)
Creatinine: 0.9 mg/dL (ref 0.6–1.1)
EGFR: >90 mL/min/{1.73_m2} (ref >90)
Glucose: 84 mg/dl (ref 70–140)
POTASSIUM: 4 meq/L (ref 3.5–5.1)
Sodium: 137 mEq/L (ref 136–145)
Total Bilirubin: 0.31 mg/dL (ref 0.20–1.20)
Total Protein: 8.8 g/dL — ABNORMAL HIGH (ref 6.4–8.3)

## 2015-07-01 LAB — PROTIME-INR
INR: 3.78 — ABNORMAL HIGH (ref 0.00–1.49)
Prothrombin Time: 36.4 seconds — ABNORMAL HIGH (ref 11.6–15.2)

## 2015-07-01 LAB — POCT INR: INR: 3.78

## 2015-07-01 MED ORDER — LORAZEPAM 0.5 MG PO TABS: 0.5000 mg | ORAL_TABLET | Freq: Four times a day (QID) | ORAL | Status: DC | PRN

## 2015-07-01 MED ORDER — DARBEPOETIN ALFA 200 MCG/0.4ML IJ SOSY: 200.0000 ug | PREFILLED_SYRINGE | Freq: Once | INTRAMUSCULAR | Status: DC

## 2015-07-01 MED ORDER — CYCLOBENZAPRINE HCL 5 MG PO TABS: ORAL_TABLET | ORAL | Status: DC

## 2015-07-01 MED ORDER — POTASSIUM CHLORIDE CRYS ER 20 MEQ PO TBCR: 20.0000 meq | EXTENDED_RELEASE_TABLET | Freq: Four times a day (QID) | ORAL | Status: DC

## 2015-07-01 NOTE — Telephone Encounter (Signed)
Left pt amessage to make a 6 week appt w/ echo

## 2015-07-01 NOTE — Patient Instructions (Signed)
FOLLOW UP IN 6 months with an ECHO.

## 2015-07-01 NOTE — Progress Notes (Signed)
Communicated with pt about her blood pressure being on the lower side today 1st BP-96/68. I rechecked BP at 2:04p, it was 86/63. Pt denies any dizziness or light-headedness at the time but did say some mornings she feels a "little dizzy" before getting up. I called Dr. Sung Amabile 's nurse to see if they wanted to make any changes to her medications. His nurse said her BP was normal this am when he saw her 106/64. He did not want to change any medications. Pt is on carvedilol 6.25 mg twice daily and lisinopril 10 mg once daily. Pt had not eaten anything since early morning and that was Jello. I advised her to keep a log of her morning BP's and to bring it to next appt so we will be able to monitor these more closely. I told pt if she feels dizziness, faintness or unusual light-headedness to call us.  Pt verbalized understanding and had no further concerns.

## 2015-07-01 NOTE — Progress Notes (Signed)
INR = 3.78     Goal 2-3 INR is above goal range today. No complications of anticoagulation noted. She is currently taking Xeloda; 7 days on and 7 days off. She is currently "on" Xeloda and will take through next Tuesday. H/H = 11.5/35.9  Hold coumadin today. Slightly decrease Coumadin to 2.5 mg on MW and 1.25mg  (half tablet) all other days. Recheck INR on 07/15/15; lab at 12:00, Dr. Jana Hakim at 12:30 and Coumadin clinic at 1:15.  Erika [name], PharmD

## 2015-07-01 NOTE — Progress Notes (Signed)
Patient ID: Erika [name], female   DOB: [birth year], 44 y.o.   MRN: [id number] Referring Physician: Barbee Shropshire NP Primary Care: Primary Cardiologist: None  Oncologist: Dr Jana Hakim  HPI: Erika [name] is a 44 year old with history of metastatic left breast cancer, ER 14%, PR negative, HER-2/neu negative , triple negative with a Ki-67 that approaches 100%, DVT RUE, iron deficiency anemia, S/P radiation chest wall 02/04/14 through 03/18/2014 referred to [place] [facility] by Dr Jana Hakim due to reduced EF.   Today she denies SOB walking. Started on carvedilol and lisinopril in June 2016 due to reduced EF.  Mild dyspnea with steps. Denies CP. Weight down 70 pounds over the last 2 years. No lower extremity edema. Able to walk 400 meters.   -Received 4 cycles doxorubicin and cyclophosphamide in dose dense fashion x4, followed by carboplatin and paclitaxel x1, discontinued due to neuropathy, followed by carboplatin and gemcitabine, both given day 1 of each 14 day cycle x6, completed 10/12/2013 . -started cyclophosphamide, methotrexate, fluorouracil (CMF) 11/19/2014, repeated every 21 days.               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 Antithrombin level was normal. - to start liposomal doxorubicin (Doxil) 05/14/2015, canceled because of low ejection fraction - started capecitabine1.5g BID, 05/20/15, 1 week on, 1 week off.   ECHO 05/21/2015  With contrast EF 25-25%  ECHO 05/16/2015-EF 25-30% RV normal Lat s' 8.3 GLS -14% ECHO 05/2013 EF 55%  Lat  s' 9.0 No GLS   Labs 06/24/2015: K 3.7 Creatinine 1.1     Review of Systems: [y] = yes, _0  = no   General: Weight gain _1 ; Weight loss  _2 ; Anorexia _3 ; Fatigue [Y ]; Fever _4 ; Chills _5 ; Weakness _6   Cardiac: Chest pain/pressure _7 ; Resting SOB _8 ; Exertional SOB [Y ]; Orthopnea _9 ; Pedal Edema _10 ; Palpitations _11 ; Syncope _12 ; Presyncope _13 ; Paroxysmal nocturnal dyspnea_14   Pulmonary: Cough _15 ; Wheezing_16 ; Hemoptysis_17 ; Sputum _18 ; Snoring _19   GI: Vomiting_20 ; Dysphagia_21 ; Melena_22 ; Hematochezia _23 ; Heartburn_24 ; Abdominal pain _25 ; Constipation _26 ; Diarrhea _27 ; BRBPR _28   GU: Hematuria_29 ; Dysuria _30 ; Nocturia_31   Vascular: Pain in legs with walking _32 ; Pain in feet with lying flat _33 ; Non-healing sores _34 ; Stroke _35 ; TIA _36 ; Slurred speech _37 ;  Neuro: Headaches_38 ; Vertigo_39 ; Seizures_40 ; Paresthesias_41 ;Blurred vision _42 ; Diplopia _43 ; Vision changes _44   Ortho/Skin: Arthritis _45 ; Joint pain _46 ; Muscle pain _47 ; Joint swelling _48 ; Back Pain _49 ; Rash _50   Psych: Depression_51 ; Anxiety_52   Heme: Bleeding problems _53 ; Clotting disorders _54 ; Anemia _55   Endocrine: Diabetes _56 ; Thyroid dysfunction_57    Past Medical History  Diagnosis Date  . Breast cancer     Invasive High Grade Carcinoma  . Headache(784.0)     Hx: of Migraines  . Status post chemotherapy     Adriamycin and Cytoxan  . Anxiety     ANXIOUS ABOUT HAVEING BIG SURGERY - MASTECTOMY  . Neuropathy     SIDE EFFECTS  FROM CHEMO  . DVT (deep venous thrombosis)     RT ARM-PT ON LOVENOX  . Hx of iron deficiency anemia 11/26/13  . S/P radiation therapy 02-04-14 to 03-18-14                               1) Left Chest Wall / 50 Gy in 25 fractions/ 2) Left Supraclavicular fossa/ 46 Gy in 23 fractions / 3) Left Posterior Axillary boost / 8.533 Gy in 23 fractions/ 4) Left Chest Wall Scar boost / 10 Gy in 5 fractions    Current Outpatient Prescriptions  Medication Sig Dispense Refill  . capecitabine (XELODA) 500 MG tablet Take 3 tablets (1,500 mg total) by mouth 2 (two) times daily after a meal. 84 tablet 3  . carvedilol (COREG) 6.25 MG  tablet Take 1 tablet (6.25 mg total) by mouth 2 (two) times daily with a meal. 60 tablet 6  . cyclobenzaprine (FLEXERIL) 5 MG tablet TAKE 1 TABLET BY MOUTH THREE TIMES A DAY FOR MUSCLE SPASMS 30 tablet 2  . fentaNYL (DURAGESIC - DOSED MCG/HR) 25 MCG/HR patch Place 1 patch (25 mcg total) onto the skin every 3 (three) days. 10 patch 0  . fentaNYL (DURAGESIC - DOSED MCG/HR) 75 MCG/HR Place 1 patch (75 mcg total) onto the skin every 3 (three) days. 10 patch 0  . gabapentin (NEURONTIN) 300 MG capsule Take 1 capsule (300 mg total) by mouth 3 (three) times daily. 90 capsule 2  . lisinopril (PRINIVIL) 10 MG tablet Take 1 tablet (10 mg total) by mouth daily. 30 tablet 6  . loratadine (CLARITIN) 10 MG tablet Take 10 mg by mouth daily as needed for allergies.    Marland Kitchen LORazepam (ATIVAN) 0.5 MG tablet Take 1 tablet (0.5 mg total) by mouth every 6 (six) hours as needed for anxiety. 30 tablet 0  . ondansetron (ZOFRAN) 8 MG tablet Take 1 tablet (8 mg total) by mouth 2 (two) times daily. 20 tablet 1  . oxyCODONE (OXY IR/ROXICODONE) 5 MG immediate release tablet Take 1 tablet (5 mg total) by mouth every 8 (eight) hours as needed for severe pain or breakthrough pain. 90 tablet 0  . potassium chloride SA (K-DUR,KLOR-CON) 20 MEQ tablet Take 1 tablet (20 mEq total) by mouth 4 (four) times daily. (Patient taking differently: Take 40 mEq by mouth 2 (two) times daily. ) 120 tablet 1  . warfarin (COUMADIN) 5 MG tablet Take 2.5 mg by mouth daily. Patient is getting regulated on Coumadin. Pt takes 2.2m daily .    . zolpidem (AMBIEN) 5 MG tablet Take 1 tablet (5 mg total) by mouth at bedtime as needed for sleep. 30 tablet 0  . erythromycin (ROMYCIN) ophthalmic ointment Place 1 application into both eyes 4 (four) times daily. (Patient not taking: Reported on 06/18/2015) 3.5 g 0   No current facility-administered medications for this encounter.   Facility-Administered Medications Ordered in Other Encounters  Medication Dose Route  Frequency Provider Last Rate Last Dose  . sodium chloride 0.9 % injection 10 mL  10 mL Intravenous PRN GChauncey Cruel MD   10 mL at 06/19/15 1330  . topical emolient (BIAFINE) emulsion   Topical Daily SEppie Gibson MD        Allergies  Allergen Reactions  . Doxycycline Nausea And Vomiting      History   Social History  . Marital Status: Single    Spouse Name: N/A  . Number  of Children: 3  . Years of Education: N/A   Occupational History  .     Social History Main Topics  . Smoking status: Never Smoker   . Smokeless tobacco: Never Used  . Alcohol Use: No  . Drug Use: No  . Sexual Activity: Yes   Other Topics Concern  . Not on file   Social History Narrative      Family History  Problem Relation Age of Onset  . Hypertension Mother   . Diabetes Father 79  . Stomach cancer Maternal Aunt   . Hypertension Maternal Grandmother   . Hypertension Maternal Grandfather   . Diabetes Paternal Grandmother   . Diabetes Paternal Grandfather   . Diabetes Paternal Uncle   . Diabetes Paternal Uncle   . Diabetes Paternal Uncle     Filed Vitals:   07/01/15 1109  BP: 106/64  Pulse: 100  Weight: 169 lb 12 oz (76.998 kg)  SpO2: 98%    PHYSICAL EXAM: General:  Well appearing. No respiratory difficulty HEENT: normal Neck: supple. no JVD. Carotids 2+ bilat; no bruits. No lymphadenopathy or thryomegaly appreciated. Cor: PMI nondisplaced. Regular rate & rhythm. No rubs, gallops or murmurs. Lungs: clear Abdomen: soft, nontender, nondistended. No hepatosplenomegaly. No bruits or masses. Good bowel sounds. Extremities: no cyanosis, clubbing, rash, edema Neuro: alert & oriented x 3, cranial nerves grossly intact. moves all 4 extremities w/o difficulty. Affect pleasant.  EKG: NSR 92 bpm    ASSESSMENT & PLAN:  1. 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 Has not ischemic work up. No chest pain. If occurs will need stress test. Newly reduced  EF from 55% in 2014 to 25-30% in June 2016. In the past received doxorubicin.  NYHA II. Volume status stable. Unable to titrate HF meds due to soft meds. May need to add corlanor.  Continue carvedilol 6.25 mg twice a day.    Continue lisinopril 10 mg daily Plan to repeat ECHO  2. Metastatic breast Cancer- Per Magrinat 3. RUE DVT- on coumadin.   Follow up in 4 weeks with an ECHO   Patient seen and examined with Darrick Grinder, NP. We discussed all aspects of the encounter. I agree with the assessment and plan as stated above.   44 y/o I have reviewed all echos personally. 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 Currently NYHA II. Volume status looks good. She has been started on appropriate cardiac regimen with carvedilol and lisinopril. BP too low to titrate.   Discussed pathophysiology of doxirubicin toxicity. Will see back in 4 weeks with repeat echo.   Xzander Gilham,MD 11:43 PM

## 2015-07-01 NOTE — Progress Notes (Signed)
ID: Erika [name] OB: 09-04-1971  MR#: [id number]  CSN#:[id number]  PCP: Erika Pillow, NP GYN:   SU: Erika [name] OTHER MD:  CHIEF COMPLAINT:  metastatic breast cancer, congestive heart failure  CURRENT TREATMENT: capecitabine  BREAST CANCER HISTORY: From Erika [name]:  "Patient developed a left sided breast mass show up about 6 months prior to diagnosis. 2 months prior this began getting larger rapidly. She was seen in an urgent care where this mass was thought to be an abscess. Apparently there was an attempt to excise or drain this that was not successful and she was referred to Erika [name] for evaluation. She underwent punch biopsy on 04/23/13 of her skin and the mass. She was then also sent for mm/us. The biopsy shows invasive high grade carcinoma that appears to be breast. It is ER 14%, PR negative, HER-2/neu negative with a Ki-67 that approaches 100%. She has also undergone mm with at least 10 cm upper inner left breast tumor with mild overyling skin thickening. There is also a 3x10 mm cluster of calcs near there also. There is indeterminate 7 mm level 1 left axillary node, she underwent a lymph node biopsy that was positive for disease. No mm evidence of right breast malignancy. An MRI of the breasts was done on 05/22/13 and showed a 14 cm mass in the left breast protruding from the skin and also a 2.5 cm level II axillary node."  Her subsequent history is as detailed below  INTERVAL HISTORY:   Erika [name] returns today for follow up of her breast cancer, accompanied by her significant other, Erika [name]. She will start cycle 4 of capecitabine tomorrow given 7 days on 7 days off. Since her last visit she was given 2 units of blood as detailed in the last [name], and we have written her for aransep injections to begin every other week starting today. She states her energy level comes and goes. Today she is sleepy. She visited with the cardiologist earlier this  morning and she will continue on the prescribed doses of coreg and lisinopril.  REVIEW OF SYSTEMS:  Erika [name] denies fevers or chills. Her nausea has improved since being off of the capecitabine and she has not had to use any zofran. She had some diarrhea late last week, but this is resolved. Her appetite is only fair, secondary to taste changes. She is drinking more fluids, but half of this are non-water beverages. She denies mouth sores or rashes. Her palms are dry, but the peeling and craking has stopped. Her blood pressure is low today, but she denies shortness of breath, chest pain, cough, or palpitations. Her pain is 7/10 today because she has not yet taken her pain meds as she was driving. Otherwise it generally well controlled. She sleeps well. A detailed review of systems is otherwise stable.   PAST MEDICAL HISTORY: Past Medical History  Diagnosis Date  . Breast cancer     Invasive High Grade Carcinoma  . Headache(784.0)     Hx: of Migraines  . Status post chemotherapy     Adriamycin and Cytoxan  . Anxiety     ANXIOUS ABOUT HAVEING BIG SURGERY - MASTECTOMY  . 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 DVT (deep venous thrombosis)     RT ARM-PT ON LOVENOX  . Hx of iron deficiency anemia 11/26/13  . S/P radiation therapy 02-04-14 to 03-18-14  1) Left Chest Wall / 50 Gy in 25 fractions/ 2) Left Supraclavicular fossa/ 46 Gy in 23 fractions / 3) Left Posterior Axillary boost / 8.533 Gy in 23 fractions/ 4) Left Chest Wall Scar boost / 10 Gy in 5 fractions    PAST SURGICAL HISTORY: Past Surgical History  Procedure Laterality Date  . Tubal ligation    . Portacath placement Right 05/22/2013    Procedure: INSERTION PORT-A-CATH;  Surgeon: Erika Bookbinder, MD;  Location: [facility];  Service: General;  Laterality: Right;  . Left breast needle core biopsy Left 05/21/13  . Left breast needle core  biopsy Left 06/22/13  . Mastectomy modified radical Left 11/15/2013     Procedure: LEFT MASTECTOMY MODIFIED RADICAL;  Surgeon: Erika Bookbinder, MD;  Location: [facility];  Service: General;  Laterality: Left;    FAMILY HISTORY Family History  Problem Relation Age of Onset  . Hypertension Mother   . Diabetes Father 30  . Stomach cancer Maternal Aunt   . Hypertension Maternal Grandmother   . Hypertension Maternal Grandfather   . Diabetes Paternal Grandmother   . Diabetes Paternal Grandfather   . Diabetes Paternal Uncle   . Diabetes Paternal Uncle   . Diabetes Paternal Uncle   The patient's mother is alive, age 2. The patient's father died from complications of diabetes at the age of 35. The patient has 2 brothers, one sister. There is no history of breast or ovarian cancer in the family to her knowledge .the patient has not undergone genetic testing   GYNECOLOGIC HISTORY:  Menarche age 80, first live birth age 72. The patient is GX P3. The patient has been on goserelin since 06/13/2013, given monthly  SOCIAL HISTORY:  The patient has worked at as a Furniture conservator/restorer and a Retail buyer. She is currently applying for disability. At home she lives with her youngest son, Erika [name], 77; with [name]; and with [name].   ADVANCED DIRECTIVES: Not in place  HEALTH MAINTENANCE: History  Substance Use Topics  . Smoking status: Never Smoker   . Smokeless tobacco: Never Used  . Alcohol Use: No    Colonoscopy: Bone Density Scan:  Pap Smear:  Eye Exam:  Vitamin D Level:   Lipid Panel:    Allergies  Allergen Reactions  . Doxycycline Nausea And Vomiting    Current Outpatient Prescriptions  Medication Sig Dispense Refill  . capecitabine (XELODA) 500 MG tablet Take 3 tablets (1,500 mg total) by mouth 2 (two) times daily after a meal. 84 tablet 3  . carvedilol (COREG) 6.25 MG tablet Take 1 tablet (6.25 mg total) by mouth 2 (two) times daily with a meal. 60 tablet 6  . fentaNYL (DURAGESIC - DOSED MCG/HR) 25 MCG/HR  patch Place 1 patch (25 mcg total) onto the skin every 3 (three) days. 10 patch 0  . fentaNYL (DURAGESIC - DOSED MCG/HR) 75 MCG/HR Place 1 patch (75 mcg total) onto the skin every 3 (three) days. 10 patch 0  . gabapentin (NEURONTIN) 300 MG capsule Take 1 capsule (300 mg total) by mouth 3 (three) times daily. 90 capsule 2  . lisinopril (PRINIVIL) 10 MG tablet Take 1 tablet (10 mg total) by mouth daily. 30 tablet 6  . loratadine (CLARITIN) 10 MG tablet Take 10 mg by mouth daily as needed for allergies.    Marland Kitchen ondansetron (ZOFRAN) 8 MG tablet Take 1 tablet (8 mg total) by mouth 2 (two) times daily. 20 tablet 1  . oxyCODONE (OXY  IR/ROXICODONE) 5 MG immediate release tablet Take 1 tablet (5 mg total) by mouth every 8 (eight) hours as needed for severe pain or breakthrough pain. 90 tablet 0  . warfarin (COUMADIN) 5 MG tablet Take 2.5 mg by mouth daily. Patient is getting regulated on Coumadin. Pt takes 2.5 mg M,W,F, all other days 1.25 mg (1/2 tablet).    . zolpidem (AMBIEN) 5 MG tablet Take 1 tablet (5 mg total) by mouth at bedtime as needed for sleep. 30 tablet 0  . cyclobenzaprine (FLEXERIL) 5 MG tablet TAKE 1 TABLET BY MOUTH THREE TIMES A DAY FOR MUSCLE SPASMS 30 tablet 2  . LORazepam (ATIVAN) 0.5 MG tablet Take 1 tablet (0.5 mg total) by mouth every 6 (six) hours as needed for anxiety. 30 tablet 0  . potassium chloride SA (K-DUR,KLOR-CON) 20 MEQ tablet Take 1 tablet (20 mEq total) by mouth 4 (four) times daily. 120 tablet 1   No current facility-administered medications for this visit.   Facility-Administered Medications Ordered in Other Visits  Medication Dose Route Frequency Provider Last Rate Last Dose  . sodium chloride 0.9 % injection 10 mL  10 mL Intravenous PRN Erika Cruel, MD   10 mL at 06/19/15 1330  . topical emolient (BIAFINE) emulsion   Topical Daily [name], MD        OBJECTIVE: middle-aged African American woman who appears stated age 44 Vitals:   07/01/15 1258  BP:  96/68  Pulse: 96  Resp: 20     Body mass index is 32.87 kg/(m^2).      ECOG FS:2 - Symptomatic, <50% confined to bed   Skin: palms dry and hyperpigmented, no cracks or peeling HEENT: sclerae anicteric, conjunctivae pink, oropharynx clear. No thrush or mucositis.  Lymph Nodes: No cervical or supraclavicular lymphadenopathy  Lungs: clear to auscultation bilaterally, no rales, wheezes, or rhonci  Heart: regular rate and rhythm  Abdomen: round, soft, non tender, positive bowel sounds  Musculoskeletal: No focal spinal tenderness, no peripheral edema  Neuro: non focal, well oriented, positive affect  Breasts: deferred  LAB RESULTS:  CMP     Component Value Date/Time   NA 137 07/01/2015 1241   NA 133* 06/11/2015 2304   K 4.0 07/01/2015 1241   K 4.7 06/11/2015 2304   CL 103 06/11/2015 2304   CL 102 06/01/2013 1500   CO2 28 07/01/2015 1241   CO2 24 06/11/2015 2304   GLUCOSE 84 07/01/2015 1241   GLUCOSE 120* 06/11/2015 2304   GLUCOSE 95 06/01/2013 1500   BUN 9.8 07/01/2015 1241   BUN 23* 06/11/2015 2304   CREATININE 0.9 07/01/2015 1241   CREATININE 1.02* 06/11/2015 2304   CALCIUM 9.9 07/01/2015 1241   CALCIUM 9.5 06/11/2015 2304   PROT 8.8* 07/01/2015 1241   PROT 9.2* 06/11/2015 2304   ALBUMIN 2.8* 07/01/2015 1241   ALBUMIN 2.9* 06/11/2015 2304   AST 18 07/01/2015 1241   AST 21 06/11/2015 2304   ALT 8 07/01/2015 1241   ALT 11* 06/11/2015 2304   ALKPHOS 116 07/01/2015 1241   ALKPHOS 109 06/11/2015 2304   BILITOT 0.31 07/01/2015 1241   BILITOT 0.2* 06/11/2015 2304   GFRNONAA >60 06/11/2015 2304   GFRAA >60 06/11/2015 2304    I No results found for: SPEP  Lab Results  Component Value Date   WBC 7.3 07/01/2015   NEUTROABS 5.3 07/01/2015   HGB 11.5* 07/01/2015   HCT 35.9 07/01/2015   MCV 85.4 07/01/2015   PLT 319 07/01/2015  Chemistry      Component Value Date/Time   NA 137 07/01/2015 1241   NA 133* 06/11/2015 2304   K 4.0 07/01/2015 1241   K 4.7  06/11/2015 2304   CL 103 06/11/2015 2304   CL 102 06/01/2013 1500   CO2 28 07/01/2015 1241   CO2 24 06/11/2015 2304   BUN 9.8 07/01/2015 1241   BUN 23* 06/11/2015 2304   CREATININE 0.9 07/01/2015 1241   CREATININE 1.02* 06/11/2015 2304      Component Value Date/Time   CALCIUM 9.9 07/01/2015 1241   CALCIUM 9.5 06/11/2015 2304   ALKPHOS 116 07/01/2015 1241   ALKPHOS 109 06/11/2015 2304   AST 18 07/01/2015 1241   AST 21 06/11/2015 2304   ALT 8 07/01/2015 1241   ALT 11* 06/11/2015 2304   BILITOT 0.31 07/01/2015 1241   BILITOT 0.2* 06/11/2015 2304       Lab Results  Component Value Date   LABCA2 35 05/18/2013    No components found for: LABCA125   Recent Labs Lab 07/01/15 1312  INR 3.78*    Urinalysis    Component Value Date/Time   COLORURINE YELLOW 02/24/2015 1632    STUDIES: No results found.  ASSESSMENT: 44 y.o.  BRCA negative [place] woman with  triple negative metastatic breast cancer.  (0) genetic testing of the ATM, BARD1, BRCA1, BRCA2, BRIP1, CDH1, CHEK2, EPCAM, FANCC, MLH1, MSH2, MSH6, NBN, PALB2, PMS2, PTEN, RAD51C, RAD51D, STK11, TP53, and XRCC2 genes showed no deleterious mutations  (1) status post left breast biopsy 04/23/2013 for a clinical T4 N1, stage IIIB invasive ductal carcinoma, grade 3,  estrogen receptor 14% positive with moderate staining intensity, progesterone receptor negative,  with an MIB-1 of 100% and HER-2/neu  nonamplified  (2) biopsy of a left axillary lymph node [phone] was positive  (3) started monthly goserelin July of 02/01/2013  (4)  received  neoadjuvant chemotherapy with doxorubicin and cyclophosphamide in dose dense fashion x4, followed by carboplatin and paclitaxel x1, discontinued due to neuropathy, followed by carboplatin and gemcitabine, both given day 1 of each 14 day cycle x6, completed 10/12/2013 .   (5) right upper extremity DVT documented 06/29/2013,  treated with Lovenox from July  through December 2014     (6) status post left modified radical mastectomy 11/15/2013 for a residual pT3 pN0 invasive ductal carcinoma, grade 3, with repeat prognostic panel triple negative. All 12 axillary lymph nodes were clear. Margins were negative   (5) completed adjuvant radiation therapy 03/18/2014,  with capecitabine added 2-3 weeks into treatment.  METASTATIC DISEASE  (6) CT scans of the chest, abdomen, and pelvis 06/26/2014 showed a large right pleural effusion  with pleural studding, 2 left upper lobe nodules, and two  liver lesions. Cytology from right thoracentesis 07/03/2014 showed malignant cells consistent with the patient's known primary  (7) started eribulin 07/09/2014, given days 1 and 8 of each 21 day cycle; restagind studies after cycle 3 showed evidence of response. Discontinued after cycle 5 (last dose 10/08/14) because of worsening peripheral neuropathy--also with progression.   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 Antithrombin level was normal.  (12) to start liposomal doxorubicin (Doxil) 05/14/2015, canceled because of low ejection fraction  (13) started capecitabine1.5g BID, 05/20/15, 1 week on, 1 week off  (14) CHF with echo 05/21/2015 showing an ejection fraction of 25-30%  PLAN:  The labs were reviewed in detail and Herlinda's hgb is 11.5 so she will not start aranesp today after all. She has been within normal range since her blood  transfusion 2 weeks ago. Her creatinine is also back down to normal since she has been drinking more fluids, but we stressed the importance of water specifically today since she is very fond of iced tea.   Prajna will have a repeat liver MRI and follow up visit with Dr. Jana Hakim. They will discuss the results and make changes to her treatment plan as necessary. She will go ahead and start cycle 4 of capecitabine tomorrow. She understands and agrees with this plan. She knows the goal of treatment in her case is control. She has been encouraged to call with any issues that might arise before her next visit here.   [name], NP 07/01/2015 1:48 PM

## 2015-07-10 ENCOUNTER — Other Ambulatory Visit: Payer: Self-pay | Admitting: Nurse Practitioner

## 2015-07-10 ENCOUNTER — Ambulatory Visit (HOSPITAL_COMMUNITY)
Admission: RE | Admit: 2015-07-10 | Discharge: 2015-07-10 | Disposition: A | Discharge status: Home / Self Care | Payer: No Typology Code available for payment source | Source: Ambulatory Visit | Attending: Nurse Practitioner | Admitting: Nurse Practitioner

## 2015-07-10 DIAGNOSIS — C50919 Malignant neoplasm of unspecified site of unspecified female breast: Secondary | ICD-10-CM | POA: Diagnosis not present

## 2015-07-10 DIAGNOSIS — C787 Secondary malignant neoplasm of liver and intrahepatic bile duct: Secondary | ICD-10-CM | POA: Insufficient documentation

## 2015-07-10 DIAGNOSIS — C50212 Malignant neoplasm of upper-inner quadrant of left female breast: Secondary | ICD-10-CM

## 2015-07-10 MED ORDER — GADOBENATE DIMEGLUMINE 529 MG/ML IV SOLN
15.0000 mL | Freq: Once | INTRAVENOUS | Status: AC | PRN
  Administered 2015-07-10: 15 mL via INTRAVENOUS

## 2015-07-15 ENCOUNTER — Ambulatory Visit: Payer: No Typology Code available for payment source

## 2015-07-15 ENCOUNTER — Telehealth: Payer: Self-pay | Admitting: Oncology

## 2015-07-15 ENCOUNTER — Ambulatory Visit (HOSPITAL_BASED_OUTPATIENT_CLINIC_OR_DEPARTMENT_OTHER): Payer: No Typology Code available for payment source | Admitting: Oncology

## 2015-07-15 ENCOUNTER — Other Ambulatory Visit (HOSPITAL_COMMUNITY)
Admission: RE | Admit: 2015-07-15 | Discharge: 2015-07-15 | Disposition: A | Discharge status: Home / Self Care | Payer: No Typology Code available for payment source | Source: Ambulatory Visit | Attending: Oncology | Admitting: Oncology

## 2015-07-15 ENCOUNTER — Ambulatory Visit (HOSPITAL_BASED_OUTPATIENT_CLINIC_OR_DEPARTMENT_OTHER): Payer: No Typology Code available for payment source | Admitting: Pharmacist

## 2015-07-15 ENCOUNTER — Other Ambulatory Visit (HOSPITAL_BASED_OUTPATIENT_CLINIC_OR_DEPARTMENT_OTHER): Payer: No Typology Code available for payment source

## 2015-07-15 VITALS — BP 96/66 | HR 110 | Temp 98.5°F | Resp 18 | Ht 60.0 in | Wt 173.7 lb

## 2015-07-15 DIAGNOSIS — E860 Dehydration: Secondary | ICD-10-CM

## 2015-07-15 DIAGNOSIS — I823 Embolism and thrombosis of renal vein: Secondary | ICD-10-CM

## 2015-07-15 DIAGNOSIS — C50212 Malignant neoplasm of upper-inner quadrant of left female breast: Secondary | ICD-10-CM | POA: Insufficient documentation

## 2015-07-15 DIAGNOSIS — T451X5A Adverse effect of antineoplastic and immunosuppressive drugs, initial encounter: Secondary | ICD-10-CM

## 2015-07-15 DIAGNOSIS — C800 Disseminated malignant neoplasm, unspecified: Secondary | ICD-10-CM | POA: Diagnosis present

## 2015-07-15 DIAGNOSIS — C50912 Malignant neoplasm of unspecified site of left female breast: Secondary | ICD-10-CM

## 2015-07-15 DIAGNOSIS — D6481 Anemia due to antineoplastic chemotherapy: Secondary | ICD-10-CM

## 2015-07-15 LAB — CBC WITH DIFFERENTIAL/PLATELET
BASO%: 0.2 % (ref 0.0–2.0)
Basophils Absolute: 0 10*3/uL (ref 0.0–0.1)
EOS%: 2.6 % (ref 0.0–7.0)
Eosinophils Absolute: 0.2 10*3/uL (ref 0.0–0.5)
HCT: 32.7 % — ABNORMAL LOW (ref 34.8–46.6)
HEMOGLOBIN: 10.4 g/dL — AB (ref 11.6–15.9)
LYMPH%: 20.8 % (ref 14.0–49.7)
MCH: 27.5 pg (ref 25.1–34.0)
MCHC: 31.8 g/dL (ref 31.5–36.0)
MCV: 86.4 fL (ref 79.5–101.0)
MONO#: 0.3 10*3/uL (ref 0.1–0.9)
MONO%: 4.7 % (ref 0.0–14.0)
NEUT#: 4.5 10*3/uL (ref 1.5–6.5)
NEUT%: 71.7 % (ref 38.4–76.8)
Platelets: 308 10*3/uL (ref 145–400)
RBC: 3.78 10*6/uL (ref 3.70–5.45)
RDW: 21.1 % — ABNORMAL HIGH (ref 11.2–14.5)
WBC: 6.3 10*3/uL (ref 3.9–10.3)
lymph#: 1.3 10*3/uL (ref 0.9–3.3)

## 2015-07-15 LAB — COMPREHENSIVE METABOLIC PANEL (CC13)
ALT: 8 U/L (ref 0–55)
ANION GAP: 6 meq/L (ref 3–11)
AST: 18 U/L (ref 5–34)
Albumin: 2.7 g/dL — ABNORMAL LOW (ref 3.5–5.0)
Alkaline Phosphatase: 122 U/L (ref 40–150)
BUN: 8.6 mg/dL (ref 7.0–26.0)
CHLORIDE: 104 meq/L (ref 98–109)
CO2: 28 mEq/L (ref 22–29)
Calcium: 9.6 mg/dL (ref 8.4–10.4)
Creatinine: 0.8 mg/dL (ref 0.6–1.1)
Glucose: 99 mg/dl (ref 70–140)
POTASSIUM: 3.6 meq/L (ref 3.5–5.1)
SODIUM: 138 meq/L (ref 136–145)
Total Bilirubin: 0.32 mg/dL (ref 0.20–1.20)
Total Protein: 8.2 g/dL (ref 6.4–8.3)

## 2015-07-15 LAB — PROTIME-INR
INR: 3.64 — ABNORMAL HIGH (ref 0.00–1.49)
PROTHROMBIN TIME: 35.4 s — AB (ref 11.6–15.2)

## 2015-07-15 LAB — POCT INR: INR: 3.64

## 2015-07-15 MED ORDER — CAPECITABINE 500 MG PO TABS: 1500.0000 mg | ORAL_TABLET | Freq: Two times a day (BID) | ORAL | Status: DC

## 2015-07-15 MED ORDER — OXYCODONE HCL 5 MG PO TABS: 5.0000 mg | ORAL_TABLET | Freq: Three times a day (TID) | ORAL | Status: DC | PRN

## 2015-07-15 MED ORDER — DARBEPOETIN ALFA 200 MCG/0.4ML IJ SOSY: 200.0000 ug | PREFILLED_SYRINGE | Freq: Once | INTRAMUSCULAR | Status: DC

## 2015-07-15 MED ORDER — FENTANYL 75 MCG/HR TD PT72: 75.0000 ug | MEDICATED_PATCH | TRANSDERMAL | Status: DC

## 2015-07-15 MED ORDER — FENTANYL 25 MCG/HR TD PT72: 25.0000 ug | MEDICATED_PATCH | TRANSDERMAL | Status: DC

## 2015-07-15 NOTE — Progress Notes (Signed)
INR = 3.64   Goal 2-3 INR above goal range. No complications of anticoagulation noted. This was Ms. Kenan's week off Xeloda, she will resume Xeloda this evening. No dietary changes. I will decrease Coumadin to 1.25mg  (half tablet) daily. Recheck INR on 07/29/15; lab at 2:30pm, Coumadin clinic at 2:45 and injection at 3:00.  Theone Murdoch, PharmD

## 2015-07-15 NOTE — Progress Notes (Signed)
ID: Erika [name] OB: 1971-12-03  MR#: [id number]  CSN#:[id number]  PCP: Erika Pillow, NP GYN:   SU: Dr. Donne [name] OTHER MD:  CHIEF COMPLAINT:  metastatic breast cancer, congestive heart failure  CURRENT TREATMENT: capecitabine  BREAST CANCER HISTORY: From Dr. Dana [name] original intake note:  "Patient developed a left sided breast mass show up about 6 months prior to diagnosis. 2 months prior this began getting larger rapidly. She was seen in an urgent care where this mass was thought to be an abscess. Apparently there was an attempt to excise or drain this that was not successful and she was referred to Dr. Donne [name] for evaluation. She underwent punch biopsy on 04/23/13 of her skin and the mass. She was then also sent for mm/us. The biopsy shows invasive high grade carcinoma that appears to be breast. It is ER 14%, PR negative, HER-2/neu negative with a Ki-67 that approaches 100%. She has also undergone mm with at least 10 cm upper inner left breast tumor with mild overyling skin thickening. There is also a 3x10 mm cluster of calcs near there also. There is indeterminate 7 mm level 1 left axillary node, she underwent a lymph node biopsy that was positive for disease. No mm evidence of right breast malignancy. An MRI of the breasts was done on 05/22/13 and showed a 14 cm mass in the left breast protruding from the skin and also a 2.5 cm level II axillary node."  Her subsequent history is as detailed below  INTERVAL HISTORY:   Erika [name] returns today for follow up of her breast cancer, accompanied by her significant other, Erika [name]. She is due to start today day 1 cycle 5 of capecitabine, which she receives at 1500 mg twice daily 7 days on 7 days off. She is tolerating the treatment well. She does have hyperpigmentation of her hands and feet and also some fingernails. She has had no problems with diarrhea or mouth sores. She has not developed palmar or plantar erythrodysesthesia. She was  just restaged with a repeat MRI of the liver and this shows continuing evidence of response  REVIEW OF SYSTEMS:  Erika [name] tells me her energy is better. She denies nausea or vomiting problems. She has occasional problems with taste alteration. Her pain is well controlled on fentanyl and oxycodone without any changes in dosing and she is not constipated from these medications. She denies unusual headaches, visual changes, dizziness, or gait imbalance. There has been no cough, phlegm production, pleurisy, or worsening shortness of breath. A detailed review of systems today was otherwise stable  PAST MEDICAL HISTORY: Past Medical History  Diagnosis Date  . Breast cancer     Invasive High Grade Carcinoma  . Headache(784.0)     Hx: of Migraines  . Status post chemotherapy     Adriamycin and Cytoxan  . Anxiety     ANXIOUS ABOUT HAVEING BIG SURGERY - MASTECTOMY  . 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 DVT (deep venous thrombosis)     RT ARM-PT ON LOVENOX  . Hx of iron deficiency anemia 11/26/13  . S/P radiation therapy 02-04-14 to 03-18-14                               1) Left Chest Wall / 50 Gy in 25 fractions/ 2) Left Supraclavicular fossa/ 46 Gy in 23 fractions / 3) Left Posterior Axillary boost / 8.533 Gy in 23 fractions/ 4)  Left Chest Wall Scar boost / 10 Gy in 5 fractions    PAST SURGICAL HISTORY: Past Surgical History  Procedure Laterality Date  . Tubal ligation    . Portacath placement Right 05/22/2013    Procedure: INSERTION PORT-A-CATH;  Surgeon: Erika Bookbinder, MD;  Location: [facility];  Service: General;  Laterality: Right;  . Left breast needle core biopsy Left 05/21/13  . Left breast needle core  biopsy Left 06/22/13  . Mastectomy modified radical Left 11/15/2013    Procedure: LEFT MASTECTOMY MODIFIED RADICAL;  Surgeon: Erika Bookbinder, MD;  Location: [facility];  Service: General;  Laterality: Left;    FAMILY HISTORY Family History  Problem Relation Age of Onset  .  Hypertension Mother   . Diabetes Father 47  . Stomach cancer Maternal Aunt   . Hypertension Maternal Grandmother   . Hypertension Maternal Grandfather   . Diabetes Paternal Grandmother   . Diabetes Paternal Grandfather   . Diabetes Paternal Uncle   . Diabetes Paternal Uncle   . Diabetes Paternal Uncle   The patient's mother is alive, age 73. The patient's father died from complications of diabetes at the age of 76. The patient has 2 brothers, one sister. There is no history of breast or ovarian cancer in the family to her knowledge .the patient has not undergone genetic testing   GYNECOLOGIC HISTORY:  Menarche age 68, first live birth age 61. The patient is GX P3. The patient has been on goserelin since 06/13/2013, given monthly  SOCIAL HISTORY:  The patient has worked at as a Furniture conservator/restorer and a Retail buyer. She is currently applying for disability. At home she lives with her youngest son, Erika [name], 37; with [name]; and with [name].   ADVANCED DIRECTIVES: Not in place  HEALTH MAINTENANCE: History  Substance Use Topics  . Smoking status: Never Smoker   . Smokeless tobacco: Never Used  . Alcohol Use: No    Colonoscopy: Bone Density Scan:  Pap Smear:  Eye Exam:  Vitamin D Level:   Lipid Panel:    Allergies  Allergen Reactions  . Doxycycline Nausea And Vomiting    Current Outpatient Prescriptions  Medication Sig Dispense Refill  . capecitabine (XELODA) 500 MG tablet Take 3 tablets (1,500 mg total) by mouth 2 (two) times daily after a meal. 84 tablet 3  . carvedilol (COREG) 6.25 MG tablet Take 1 tablet (6.25 mg total) by mouth 2 (two) times daily with a meal. 60 tablet 6  . cyclobenzaprine (FLEXERIL) 5 MG tablet TAKE 1 TABLET BY MOUTH THREE TIMES A DAY FOR MUSCLE SPASMS 30 tablet 2  . fentaNYL (DURAGESIC - DOSED MCG/HR) 25 MCG/HR patch Place 1 patch (25 mcg total) onto the skin every 3 (three) days. 10 patch 0  . fentaNYL  (DURAGESIC - DOSED MCG/HR) 75 MCG/HR Place 1 patch (75 mcg total) onto the skin every 3 (three) days. 10 patch 0  . gabapentin (NEURONTIN) 300 MG capsule Take 1 capsule (300 mg total) by mouth 3 (three) times daily. 90 capsule 2  . lisinopril (PRINIVIL) 10 MG tablet Take 1 tablet (10 mg total) by mouth daily. 30 tablet 6  . loratadine (CLARITIN) 10 MG tablet Take 10 mg by mouth daily as needed for allergies.    Marland Kitchen LORazepam (ATIVAN) 0.5 MG tablet Take 1 tablet (0.5 mg total) by mouth every 6 (six) hours as needed for anxiety. 30 tablet 0  . ondansetron (ZOFRAN) 8 MG tablet Take 1 tablet (  8 mg total) by mouth 2 (two) times daily. 20 tablet 1  . oxyCODONE (OXY IR/ROXICODONE) 5 MG immediate release tablet Take 1 tablet (5 mg total) by mouth every 8 (eight) hours as needed for severe pain or breakthrough pain. 90 tablet 0  . potassium chloride SA (K-DUR,KLOR-CON) 20 MEQ tablet Take 1 tablet (20 mEq total) by mouth 4 (four) times daily. 120 tablet 1  . warfarin (COUMADIN) 5 MG tablet Take 2.5 mg by mouth daily. Patient is getting regulated on Coumadin. Pt takes 2.5 mg M,W,F, all other days 1.25 mg (1/2 tablet).    . zolpidem (AMBIEN) 5 MG tablet Take 1 tablet (5 mg total) by mouth at bedtime as needed for sleep. 30 tablet 0   No current facility-administered medications for this visit.   Facility-Administered Medications Ordered in Other Visits  Medication Dose Route Frequency Provider Last Rate Last Dose  . sodium chloride 0.9 % injection 10 mL  10 mL Intravenous PRN Erika Cruel, MD   10 mL at 06/19/15 1330  . topical emolient (BIAFINE) emulsion   Topical Daily [name], MD        OBJECTIVE: middle-aged African American woman in no acute distress Filed Vitals:   07/15/15 1236  BP: 96/66  Pulse: 110  Temp: 98.5 F (36.9 C)  Resp: 18     Body mass index is 33.92 kg/(m^2).      ECOG FS:1 - Symptomatic but completely ambulatory   Sclerae unicteric, pupils round and equal Oropharynx  clear and moist-- no thrush or other lesions No cervical or supraclavicular adenopathy Lungs no rales or rhonchi Heart regular rate and rhythm Abd soft, nontender, positive bowel sounds MSK no focal spinal tenderness, no upper extremity lymphedema Neuro: nonfocal, well oriented, appropriate affect Breasts: Deferred Skin: Hyperpigmentation over both hands dorsally   LAB RESULTS:  CMP     Component Value Date/Time   NA 137 07/01/2015 1241   NA 133* 06/11/2015 2304   K 4.0 07/01/2015 1241   K 4.7 06/11/2015 2304   CL 103 06/11/2015 2304   CL 102 06/01/2013 1500   CO2 28 07/01/2015 1241   CO2 24 06/11/2015 2304   GLUCOSE 84 07/01/2015 1241   GLUCOSE 120* 06/11/2015 2304   GLUCOSE 95 06/01/2013 1500   BUN 9.8 07/01/2015 1241   BUN 23* 06/11/2015 2304   CREATININE 0.9 07/01/2015 1241   CREATININE 1.02* 06/11/2015 2304   CALCIUM 9.9 07/01/2015 1241   CALCIUM 9.5 06/11/2015 2304   PROT 8.8* 07/01/2015 1241   PROT 9.2* 06/11/2015 2304   ALBUMIN 2.8* 07/01/2015 1241   ALBUMIN 2.9* 06/11/2015 2304   AST 18 07/01/2015 1241   AST 21 06/11/2015 2304   ALT 8 07/01/2015 1241   ALT 11* 06/11/2015 2304   ALKPHOS 116 07/01/2015 1241   ALKPHOS 109 06/11/2015 2304   BILITOT 0.31 07/01/2015 1241   BILITOT 0.2* 06/11/2015 2304   GFRNONAA >60 06/11/2015 2304   GFRAA >60 06/11/2015 2304    I No results found for: SPEP  Lab Results  Component Value Date   WBC 6.3 07/15/2015   NEUTROABS 4.5 07/15/2015   HGB 10.4* 07/15/2015   HCT 32.7* 07/15/2015   MCV 86.4 07/15/2015   PLT 308 07/15/2015      Chemistry      Component Value Date/Time   NA 137 07/01/2015 1241   NA 133* 06/11/2015 2304   K 4.0 07/01/2015 1241   K 4.7 06/11/2015 2304   CL 103 06/11/2015  2304   CL 102 06/01/2013 1500   CO2 28 07/01/2015 1241   CO2 24 06/11/2015 2304   BUN 9.8 07/01/2015 1241   BUN 23* 06/11/2015 2304   CREATININE 0.9 07/01/2015 1241   CREATININE 1.02* 06/11/2015 2304      Component  Value Date/Time   CALCIUM 9.9 07/01/2015 1241   CALCIUM 9.5 06/11/2015 2304   ALKPHOS 116 07/01/2015 1241   ALKPHOS 109 06/11/2015 2304   AST 18 07/01/2015 1241   AST 21 06/11/2015 2304   ALT 8 07/01/2015 1241   ALT 11* 06/11/2015 2304   BILITOT 0.31 07/01/2015 1241   BILITOT 0.2* 06/11/2015 2304       Lab Results  Component Value Date   LABCA2 35 05/18/2013    No components found for: [id number]   Recent Labs Lab 07/15/15 1220  INR Sent out for confirmation    Urinalysis    Component Value Date/Time   COLORURINE YELLOW 02/24/2015 1632    STUDIES: Mr Liver W Wo Contrast  07-23-2015   CLINICAL DATA:  Metastatic breast cancer, follow-up liver metastases.  EXAM: MRI ABDOMEN WITHOUT AND WITH CONTRAST  TECHNIQUE: Multiplanar multisequence MR imaging of the abdomen was performed both before and after the administration of intravenous contrast.  CONTRAST:  72m MULTIHANCE GADOBENATE DIMEGLUMINE 529 MG/ML IV SOLN  COMPARISON:  MRI abdomen dated 05/23/2015. CT chest abdomen pelvis dated 05/01/2015.  FINDINGS: Lower chest: Pulmonary and pleural-based tumor in the right hemithorax appears to have improved when compared to prior MRI and CT. For example, a 2.5 x 1.8 cm subpleural lesion along the lateral right upper lobe (series 3/image 4) previously measured 2.7 x 2.2 cm on CT. Extensive tumor along the right hemidiaphragm on the prior MRI is not somewhat improved on the current study (series 3/image 16), although difficult to discretely measure.  Status post left mastectomy.  Hepatobiliary: 3.1 x 2.5 cm lesion inferiorly in the right hepatic lobe (at the junction of segments V/VI, series 3/image 35), previously 3.5 x 3.0 cm when measured in a similar fashion on T2 on prior MRI.  Numerous layering gallstones (series 6/ image 39), without associated inflammatory changes.  No intrahepatic extrahepatic ductal dilatation.  Pancreas: Within normal limits.  Spleen: Within normal limits.   Adrenals/Urinary Tract: Adrenal glands are within normal limits.  Kidneys within normal limits.  No hydronephrosis.  Stomach/Bowel: Stomach is within normal limits.  Visualized bowel is unremarkable.  Vascular/Lymphatic: No evidence of abdominal aortic aneurysm.  Left renal vein appears patent/reconstituted.  No suspicious abdominal lymphadenopathy.  Other: No abdominal ascites.  Musculoskeletal: No focal osseous lesions.  IMPRESSION: Status post left mastectomy.  3.1 cm metastasis inferiorly in the right hepatic lobe, mildly decreased.  Pulmonary and pleural-based metastases in the right hemithorax, incompletely visualized, decreased when compared to prior CT/ MRI.   Electronically Signed   By: SJulian HyM.D.   On: [id number]    ASSESSMENT: 44y.o.  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  (0) genetic testing of the ATM, BARD1, BRCA1, BRCA2, BRIP1, CDH1, CHEK2, EPCAM, FANCC, MLH1, MSH2, MSH6, NBN, PALB2, PMS2, PTEN, RAD51C, RAD51D, STK11, TP53, and XRCC2 genes showed no deleterious mutations  (1) status post left breast biopsy 04/23/2013 for a clinical T4 N1, stage IIIB invasive ductal carcinoma, grade 3,  estrogen receptor 14% positive with moderate staining intensity, progesterone receptor negative,  with an MIB-1 of 100% and HER-2/neu  nonamplified  (2) biopsy of a left axillary lymph node  [id number] was positive  (3) started monthly goserelin July of 02/01/2013  (4)  received  neoadjuvant chemotherapy with doxorubicin and cyclophosphamide in dose dense fashion x4, followed by carboplatin and paclitaxel x1, discontinued due to neuropathy, followed by carboplatin and gemcitabine, both given day 1 of each 14 day cycle x6, completed 10/12/2013 .   (5) right upper extremity DVT documented 06/29/2013,  treated with Lovenox from July  through December 2014    (6) status post left modified radical mastectomy 11/15/2013 for a residual pT3 pN0 invasive  ductal carcinoma, grade 3, with repeat prognostic panel triple negative. All 12 axillary lymph nodes were clear. Margins were negative   (5) completed adjuvant radiation therapy 03/18/2014,  with capecitabine added 2-3 weeks into treatment.  METASTATIC DISEASE  (6) CT scans of the chest, abdomen, and pelvis 06/26/2014 showed a large right pleural effusion  with pleural studding, 2 left upper lobe nodules, and two  liver lesions. Cytology from right thoracentesis 07/03/2014 showed malignant cells consistent with the patient's known primary  (7) started eribulin 07/09/2014, given days 1 and 8 of each 21 day cycle; restagind studies after cycle 3 showed evidence of response. Discontinued after cycle 5 (last dose 10/08/14) because of worsening peripheral neuropathy--also with progression.   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 Antithrombin level was normal.  (12) to start liposomal doxorubicin (Doxil) 05/14/2015, canceled because of low ejection fraction  (13) started capecitabine1.5g BID, 05/20/15, 1 week on, 1 week off  (14) CHF with echo 05/21/2015 showing an ejection fraction of 25-30%  PLAN:  Quorra is tolerating the capecitabine generally well and it is controlling her breast cancer. There is even a minor response. We are going to continue these treatments so long as there is no evidence of disease progression.  She is receiving Aranesp  depending on her labwork every 2 weeks. She has had no problems with that. Her congestive heart failure symptoms have improved on lisinopril and carvedilol. She will have a repeat echocardiogram later this month with follow-up with cardio oncology  She will see [place] again in a month and then again 2 months from now. After 3 additional months on capecitabine we will repeat a liver MRI.  She tells me there has been some issues regarding the billing. I am referring her back to our financial counselors regarding that. I refilled her oxycodone and Duragesic patches today.  At this point I am very pleased that she is more stable. She knows to call for any problems that may develop before her next visit here.  Erika Cruel, MD 07/15/2015 12:54 PM

## 2015-07-15 NOTE — Telephone Encounter (Signed)
Appointments made and avs printed for patient °

## 2015-07-16 ENCOUNTER — Encounter: Payer: Self-pay | Admitting: *Deleted

## 2015-07-16 NOTE — Progress Notes (Signed)
Received faxed paperwork from [facility] regarding Xeloda. Patient has received Xeloda for this month; however, for future refills prescription needs to be sent to [facility]. [facility] added to patient's pharmacy list.

## 2015-07-29 ENCOUNTER — Ambulatory Visit (HOSPITAL_BASED_OUTPATIENT_CLINIC_OR_DEPARTMENT_OTHER): Payer: No Typology Code available for payment source

## 2015-07-29 ENCOUNTER — Other Ambulatory Visit (HOSPITAL_BASED_OUTPATIENT_CLINIC_OR_DEPARTMENT_OTHER): Payer: No Typology Code available for payment source

## 2015-07-29 ENCOUNTER — Ambulatory Visit (HOSPITAL_BASED_OUTPATIENT_CLINIC_OR_DEPARTMENT_OTHER): Payer: No Typology Code available for payment source | Admitting: Pharmacist

## 2015-07-29 VITALS — BP 93/63 | HR 100 | Temp 98.3°F

## 2015-07-29 DIAGNOSIS — D6481 Anemia due to antineoplastic chemotherapy: Secondary | ICD-10-CM | POA: Diagnosis not present

## 2015-07-29 DIAGNOSIS — E860 Dehydration: Secondary | ICD-10-CM

## 2015-07-29 DIAGNOSIS — I823 Embolism and thrombosis of renal vein: Secondary | ICD-10-CM | POA: Diagnosis not present

## 2015-07-29 DIAGNOSIS — C50212 Malignant neoplasm of upper-inner quadrant of left female breast: Secondary | ICD-10-CM

## 2015-07-29 DIAGNOSIS — T451X5A Adverse effect of antineoplastic and immunosuppressive drugs, initial encounter: Secondary | ICD-10-CM

## 2015-07-29 DIAGNOSIS — C50912 Malignant neoplasm of unspecified site of left female breast: Secondary | ICD-10-CM

## 2015-07-29 LAB — COMPREHENSIVE METABOLIC PANEL (CC13)
ALT: 9 U/L (ref 0–55)
ANION GAP: 7 meq/L (ref 3–11)
AST: 15 U/L (ref 5–34)
Albumin: 2.5 g/dL — ABNORMAL LOW (ref 3.5–5.0)
Alkaline Phosphatase: 125 U/L (ref 40–150)
BILIRUBIN TOTAL: 0.27 mg/dL (ref 0.20–1.20)
BUN: 16.6 mg/dL (ref 7.0–26.0)
CHLORIDE: 104 meq/L (ref 98–109)
CO2: 26 mEq/L (ref 22–29)
Calcium: 9.2 mg/dL (ref 8.4–10.4)
Creatinine: 0.9 mg/dL (ref 0.6–1.1)
Glucose: 115 mg/dl (ref 70–140)
Potassium: 4.1 mEq/L (ref 3.5–5.1)
Sodium: 137 mEq/L (ref 136–145)
TOTAL PROTEIN: 8.1 g/dL (ref 6.4–8.3)

## 2015-07-29 LAB — CBC WITH DIFFERENTIAL/PLATELET
BASO%: 0 % (ref 0.0–2.0)
BASOS ABS: 0 10*3/uL (ref 0.0–0.1)
EOS%: 2.9 % (ref 0.0–7.0)
Eosinophils Absolute: 0.2 10*3/uL (ref 0.0–0.5)
HEMATOCRIT: 29.5 % — AB (ref 34.8–46.6)
HGB: 9.5 g/dL — ABNORMAL LOW (ref 11.6–15.9)
LYMPH#: 1.4 10*3/uL (ref 0.9–3.3)
LYMPH%: 22.8 % (ref 14.0–49.7)
MCH: 28 pg (ref 25.1–34.0)
MCHC: 32.2 g/dL (ref 31.5–36.0)
MCV: 87 fL (ref 79.5–101.0)
MONO#: 0.4 10*3/uL (ref 0.1–0.9)
MONO%: 6.6 % (ref 0.0–14.0)
NEUT#: 4.2 10*3/uL (ref 1.5–6.5)
NEUT%: 67.7 % (ref 38.4–76.8)
PLATELETS: 246 10*3/uL (ref 145–400)
RBC: 3.39 10*6/uL — AB (ref 3.70–5.45)
RDW: 21.5 % — ABNORMAL HIGH (ref 11.2–14.5)
WBC: 6.2 10*3/uL (ref 3.9–10.3)

## 2015-07-29 LAB — PROTIME-INR
INR: 2 (ref 2.00–3.50)
Protime: 24 Seconds — ABNORMAL HIGH (ref 10.6–13.4)

## 2015-07-29 LAB — POCT INR: INR: 2

## 2015-07-29 MED ORDER — DARBEPOETIN ALFA 200 MCG/0.4ML IJ SOSY
200.0000 ug | PREFILLED_SYRINGE | Freq: Once | INTRAMUSCULAR | Status: AC
  Administered 2015-07-29: 200 ug via SUBCUTANEOUS
  Filled 2015-07-29: qty 0.4

## 2015-07-29 NOTE — Progress Notes (Signed)
INR = 2 on Coumadin 1.25 mg daily She is restarting Xeloda today and will be on it for 7 days. No med changes otherwise. No bleeding.  Anemia related to chemotherapy.  Hgb = 9.5 g/dL.  She is due for Aranesp today. No missed doses of Coumadin. INR at goal.  I will leave her Coumadin dose alone since she is borderline low today.   Recheck her INR on 8/30 when she is back for lab/MD visit & inj. She may require dose increase at that visit.   Kennith Center, Pharm.D., CPP 07/29/2015@3 :09 PM

## 2015-08-06 ENCOUNTER — Telehealth: Payer: Self-pay | Admitting: *Deleted

## 2015-08-06 DIAGNOSIS — C50212 Malignant neoplasm of upper-inner quadrant of left female breast: Secondary | ICD-10-CM

## 2015-08-06 NOTE — Telephone Encounter (Addendum)
Called patient who left voicemail that her next Xeloda refill may need to be mailed and to call [facility] per [facility] Pharmacist.  Reports she has Medicaid and [employer].  Currently on rest period and will need order to resume taking next Tuesday or 08-12-2015.  Will notify [facility] Replacement Therapist.  [facility] experienced computer downtime from 1535 +/- through 1650 +/-

## 2015-08-07 MED ORDER — CAPECITABINE 500 MG PO TABS: 1500.0000 mg | ORAL_TABLET | Freq: Two times a day (BID) | ORAL | Status: DC

## 2015-08-07 NOTE — Telephone Encounter (Signed)
[facility] noted on insurance card.  [facility] is specialty pharmacy [facility].  Order sent to [place].

## 2015-08-12 ENCOUNTER — Ambulatory Visit: Payer: No Typology Code available for payment source

## 2015-08-12 ENCOUNTER — Other Ambulatory Visit (HOSPITAL_BASED_OUTPATIENT_CLINIC_OR_DEPARTMENT_OTHER): Payer: No Typology Code available for payment source

## 2015-08-12 ENCOUNTER — Ambulatory Visit (HOSPITAL_BASED_OUTPATIENT_CLINIC_OR_DEPARTMENT_OTHER): Payer: No Typology Code available for payment source | Admitting: Pharmacist

## 2015-08-12 ENCOUNTER — Ambulatory Visit (HOSPITAL_BASED_OUTPATIENT_CLINIC_OR_DEPARTMENT_OTHER): Payer: No Typology Code available for payment source | Admitting: Nurse Practitioner

## 2015-08-12 ENCOUNTER — Encounter: Payer: Self-pay | Admitting: Nurse Practitioner

## 2015-08-12 ENCOUNTER — Other Ambulatory Visit: Payer: Self-pay | Admitting: *Deleted

## 2015-08-12 VITALS — BP 81/58 | HR 110 | Temp 98.9°F | Resp 20 | Wt 167.7 lb

## 2015-08-12 DIAGNOSIS — T451X5A Adverse effect of antineoplastic and immunosuppressive drugs, initial encounter: Secondary | ICD-10-CM

## 2015-08-12 DIAGNOSIS — C50212 Malignant neoplasm of upper-inner quadrant of left female breast: Secondary | ICD-10-CM | POA: Diagnosis not present

## 2015-08-12 DIAGNOSIS — D630 Anemia in neoplastic disease: Secondary | ICD-10-CM | POA: Diagnosis not present

## 2015-08-12 DIAGNOSIS — I823 Embolism and thrombosis of renal vein: Secondary | ICD-10-CM | POA: Diagnosis not present

## 2015-08-12 DIAGNOSIS — D6481 Anemia due to antineoplastic chemotherapy: Secondary | ICD-10-CM

## 2015-08-12 DIAGNOSIS — C50912 Malignant neoplasm of unspecified site of left female breast: Secondary | ICD-10-CM

## 2015-08-12 DIAGNOSIS — I427 Cardiomyopathy due to drug and external agent: Secondary | ICD-10-CM | POA: Diagnosis not present

## 2015-08-12 LAB — CBC WITH DIFFERENTIAL/PLATELET
BASO%: 0.1 % (ref 0.0–2.0)
Basophils Absolute: 0 10*3/uL (ref 0.0–0.1)
EOS ABS: 0.2 10*3/uL (ref 0.0–0.5)
EOS%: 2.6 % (ref 0.0–7.0)
HCT: 30.5 % — ABNORMAL LOW (ref 34.8–46.6)
HEMOGLOBIN: 9.8 g/dL — AB (ref 11.6–15.9)
LYMPH%: 16.1 % (ref 14.0–49.7)
MCH: 28 pg (ref 25.1–34.0)
MCHC: 32.1 g/dL (ref 31.5–36.0)
MCV: 87.1 fL (ref 79.5–101.0)
MONO#: 0.8 10*3/uL (ref 0.1–0.9)
MONO%: 9.2 % (ref 0.0–14.0)
NEUT%: 72 % (ref 38.4–76.8)
NEUTROS ABS: 6.4 10*3/uL (ref 1.5–6.5)
PLATELETS: 387 10*3/uL (ref 145–400)
RBC: 3.5 10*6/uL — ABNORMAL LOW (ref 3.70–5.45)
RDW: 21.7 % — AB (ref 11.2–14.5)
WBC: 8.9 10*3/uL (ref 3.9–10.3)
lymph#: 1.4 10*3/uL (ref 0.9–3.3)

## 2015-08-12 LAB — PROTIME-INR
INR: 1.7 — AB (ref 2.00–3.50)
Protime: 20.4 Seconds — ABNORMAL HIGH (ref 10.6–13.4)

## 2015-08-12 LAB — COMPREHENSIVE METABOLIC PANEL (CC13)
ALBUMIN: 2.5 g/dL — AB (ref 3.5–5.0)
ALK PHOS: 132 U/L (ref 40–150)
ALT: 6 U/L (ref 0–55)
ANION GAP: 9 meq/L (ref 3–11)
AST: 20 U/L (ref 5–34)
BILIRUBIN TOTAL: 0.53 mg/dL (ref 0.20–1.20)
BUN: 11.7 mg/dL (ref 7.0–26.0)
CALCIUM: 9.9 mg/dL (ref 8.4–10.4)
CO2: 26 mEq/L (ref 22–29)
Chloride: 102 mEq/L (ref 98–109)
Creatinine: 0.9 mg/dL (ref 0.6–1.1)
EGFR: 88 mL/min/{1.73_m2} — AB (ref >90)
GLUCOSE: 95 mg/dL (ref 70–140)
Potassium: 4.2 mEq/L (ref 3.5–5.1)
Sodium: 136 mEq/L (ref 136–145)
TOTAL PROTEIN: 8.9 g/dL — AB (ref 6.4–8.3)

## 2015-08-12 LAB — POCT INR: INR: 1.7

## 2015-08-12 MED ORDER — CAPECITABINE 500 MG PO TABS: 1500.0000 mg | ORAL_TABLET | Freq: Two times a day (BID) | ORAL | Status: DC

## 2015-08-12 MED ORDER — CYCLOBENZAPRINE HCL 5 MG PO TABS: ORAL_TABLET | ORAL | Status: DC

## 2015-08-12 MED ORDER — FENTANYL 25 MCG/HR TD PT72: 25.0000 ug | MEDICATED_PATCH | TRANSDERMAL | Status: DC

## 2015-08-12 MED ORDER — OXYCODONE HCL 5 MG PO TABS: ORAL_TABLET | ORAL | Status: DC

## 2015-08-12 MED ORDER — FENTANYL 75 MCG/HR TD PT72: 75.0000 ug | MEDICATED_PATCH | TRANSDERMAL | Status: DC

## 2015-08-12 MED ORDER — LISINOPRIL 10 MG PO TABS: 10.0000 mg | ORAL_TABLET | Freq: Every day | ORAL | Status: DC

## 2015-08-12 MED ORDER — LORAZEPAM 0.5 MG PO TABS: 0.5000 mg | ORAL_TABLET | Freq: Four times a day (QID) | ORAL | Status: DC | PRN

## 2015-08-12 MED ORDER — OXYCODONE HCL 5 MG PO TABS: 5.0000 mg | ORAL_TABLET | Freq: Three times a day (TID) | ORAL | Status: DC | PRN

## 2015-08-12 MED ORDER — ONDANSETRON HCL 8 MG PO TABS: 8.0000 mg | ORAL_TABLET | Freq: Two times a day (BID) | ORAL | Status: DC

## 2015-08-12 MED ORDER — DARBEPOETIN ALFA 200 MCG/0.4ML IJ SOSY
200.0000 ug | PREFILLED_SYRINGE | Freq: Once | INTRAMUSCULAR | Status: DC
  Filled 2015-08-12: qty 0.4

## 2015-08-12 MED ORDER — CARVEDILOL 6.25 MG PO TABS: 6.2500 mg | ORAL_TABLET | Freq: Two times a day (BID) | ORAL | Status: DC

## 2015-08-12 NOTE — Progress Notes (Signed)
INR below goal today.  H/H=9.8/30.5  No changes in meds.  No bleeding/bruising.  Ms Goedde will begin Xeloda tomorrow for 1 week on and 1 week off.  Will slightly increase coumadin dose despite starting xeloda tomorrow to 2.5mg  Tues and 1.25mg  other days (Ms Kluver had been taking 1.25mg  daily).  Will check PT/INR in 2 weeks with next inj appt.  Ms Konkel had appt with Gentry Fitz, NP today.  Aranesp held today per [facility] b/c pt in so much pain.  Coumadin 2.5mg  samples x 40 given: Lot [id number] Exp 01/2016

## 2015-08-12 NOTE — Progress Notes (Signed)
ID: Erika [name] OB: [birth year]  MR#: [id number]  CSN#:[id number]  PCP: Imelda Pillow, NP GYN:   SU: Dr. [name] OTHER MD:  CHIEF COMPLAINT:  metastatic breast cancer, congestive heart failure  CURRENT TREATMENT: capecitabine  BREAST CANCER HISTORY: From Dr. [name] original intake note:  "Patient developed a left sided breast mass show up about 6 months prior to diagnosis. 2 months prior this began getting larger rapidly. She was seen in an urgent care where this mass was thought to be an abscess. Apparently there was an attempt to excise or drain this that was not successful and she was referred to Dr. [name] for evaluation. She underwent punch biopsy on 04/23/13 of her skin and the mass. She was then also sent for mm/us. The biopsy shows invasive high grade carcinoma that appears to be breast. It is ER 14%, PR negative, HER-2/neu negative with a Ki-67 that approaches 100%. She has also undergone mm with at least 10 cm upper inner left breast tumor with mild overyling skin thickening. There is also a 3x10 mm cluster of calcs near there also. There is indeterminate 7 mm level 1 left axillary node, she underwent a lymph node biopsy that was positive for disease. No mm evidence of right breast malignancy. An MRI of the breasts was done on 05/22/13 and showed a 14 cm mass in the left breast protruding from the skin and also a 2.5 cm level II axillary node."  Her subsequent history is as detailed below  INTERVAL HISTORY:   [name] returns today for follow up of her breast cancer, accompanied by her significant other, Jermaine. She is due to start cycle 6 of capecitabine today,  given 7 days on 7 days off. She tolerates this well with no diarrhea, mouth sores, or palmar/plantar erythrodysesthesia. She received her first aranesp injection on 8/16 and she believe this is the source of her new intense pain. On a scale of 1-10 she says she suffers bone aches at at "53." she continues on  fentanyl patches and oxycodone 57m PRN, but this barely touches her pain.   REVIEW OF SYSTEMS:  TMakinseydenies fevers, chills, nausea, or vomiting. Her tastes sensation has changes she she is not eating or drinking as much. She is chronically fatigued. She denies shortness of breath, chest pain, cough, or palpitations. She has no headaches, dizziness, or vision changes. A detailed review of systems is otherwise stable.   PAST MEDICAL HISTORY: Past Medical History  Diagnosis Date  . Breast cancer     Invasive High Grade Carcinoma  . Headache(784.0)     Hx: of Migraines  . Status post chemotherapy     Adriamycin and Cytoxan  . Anxiety     ANXIOUS ABOUT HAVEING BIG SURGERY - MASTECTOMY  . 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 DVT (deep venous thrombosis)     RT ARM-PT ON LOVENOX  . Hx of iron deficiency anemia 11/26/13  . S/P radiation therapy 02-04-14 to 03-18-14                               1) Left Chest Wall / 50 Gy in 25 fractions/ 2) Left Supraclavicular fossa/ 46 Gy in 23 fractions / 3) Left Posterior Axillary boost / 8.533 Gy in 23 fractions/ 4) Left Chest Wall Scar boost / 10 Gy in 5 fractions    PAST SURGICAL HISTORY: Past Surgical History  Procedure Laterality  Date  . Tubal ligation    . Portacath placement Right 05/22/2013    Procedure: INSERTION PORT-A-CATH;  Surgeon: [name], MD;  Location: [facility];  Service: General;  Laterality: Right;  . Left breast needle core biopsy Left 05/21/13  . Left breast needle core  biopsy Left 06/22/13  . Mastectomy modified radical Left 11/15/2013    Procedure: LEFT MASTECTOMY MODIFIED RADICAL;  Surgeon: [name], MD;  Location: [facility];  Service: General;  Laterality: Left;    FAMILY HISTORY Family History  Problem Relation Age of Onset  . Hypertension Mother   . Diabetes Father 25  . Stomach cancer Maternal Aunt   . Hypertension Maternal Grandmother   . Hypertension Maternal Grandfather   . Diabetes Paternal  Grandmother   . Diabetes Paternal Grandfather   . Diabetes Paternal Uncle   . Diabetes Paternal Uncle   . Diabetes Paternal Uncle   The patient's mother is alive, age 12. The patient's father died from complications of diabetes at the age of 76. The patient has 2 brothers, one sister. There is no history of breast or ovarian cancer in the family to her knowledge .the patient has not undergone genetic testing   GYNECOLOGIC HISTORY:  Menarche age 48, first live birth age 22. The patient is GX P3. The patient has been on goserelin since 06/13/2013, given monthly  SOCIAL HISTORY:  The patient has worked at as a Furniture conservator/restorer and a Retail buyer. She is currently applying for disability. At home she lives with her youngest son, Erika [name], 19; with [name]; and with [name].   ADVANCED DIRECTIVES: Not in place  HEALTH MAINTENANCE: Social History  Substance Use Topics  . Smoking status: Never Smoker   . Smokeless tobacco: Never Used  . Alcohol Use: No    Colonoscopy: Bone Density Scan:  Pap Smear:  Eye Exam:  Vitamin D Level:   Lipid Panel:    Allergies  Allergen Reactions  . Doxycycline Nausea And Vomiting    Current Outpatient Prescriptions  Medication Sig Dispense Refill  . capecitabine (XELODA) 500 MG tablet Take 3 tablets (1,500 mg total) by mouth 2 (two) times daily after a meal. 84 tablet 3  . carvedilol (COREG) 6.25 MG tablet Take 1 tablet (6.25 mg total) by mouth 2 (two) times daily with a meal. 60 tablet 6  . cyclobenzaprine (FLEXERIL) 5 MG tablet TAKE 1 TABLET BY MOUTH THREE TIMES A DAY FOR MUSCLE SPASMS 30 tablet 2  . gabapentin (NEURONTIN) 300 MG capsule Take 1 capsule (300 mg total) by mouth 3 (three) times daily. 90 capsule 2  . lisinopril (PRINIVIL) 10 MG tablet Take 1 tablet (10 mg total) by mouth daily. 30 tablet 6  . LORazepam (ATIVAN) 0.5 MG tablet Take 1 tablet (0.5 mg total) by mouth every 6 (six) hours as needed  for anxiety. 30 tablet 0  . ondansetron (ZOFRAN) 8 MG tablet Take 1 tablet (8 mg total) by mouth 2 (two) times daily. 20 tablet 1  . potassium chloride SA (K-DUR,KLOR-CON) 20 MEQ tablet Take 1 tablet (20 mEq total) by mouth 4 (four) times daily. 120 tablet 1  . warfarin (COUMADIN) 5 MG tablet Take 2.5 mg by mouth daily. Patient is getting regulated on Coumadin. Pt takes 2.5 mg M,W,F, all other days 1.25 mg (1/2 tablet).    . zolpidem (AMBIEN) 5 MG tablet Take 1 tablet (5 mg total) by mouth at bedtime as needed for sleep. 30 tablet  0  . fentaNYL (DURAGESIC - DOSED MCG/HR) 25 MCG/HR patch Place 1 patch (25 mcg total) onto the skin every 3 (three) days. 10 patch 0  . fentaNYL (DURAGESIC - DOSED MCG/HR) 75 MCG/HR Place 1 patch (75 mcg total) onto the skin every 3 (three) days. 10 patch 0  . loratadine (CLARITIN) 10 MG tablet Take 10 mg by mouth daily as needed for allergies.    Marland Kitchen oxyCODONE (OXY IR/ROXICODONE) 5 MG immediate release tablet 1-2 tablets every 8 hrs as needed for severe or breakthrough pain 180 tablet 0   No current facility-administered medications for this visit.   Facility-Administered Medications Ordered in Other Visits  Medication Dose Route Frequency Provider Last Rate Last Dose  . sodium chloride 0.9 % injection 10 mL  10 mL Intravenous PRN [name], MD   10 mL at 06/19/15 1330  . topical emolient (BIAFINE) emulsion   Topical Daily [name], MD        OBJECTIVE: middle-aged African American woman in no acute distress Filed Vitals:   08/12/15 1326  BP: 81/58  Pulse: 110  Temp: 98.9 F (37.2 C)  Resp: 20     Body mass index is 32.75 kg/(m^2).      ECOG FS:1 - Symptomatic but completely ambulatory   Skin: bilateral palms excessively dry and hyperpigmented, no cracks or peeling however HEENT: sclerae anicteric, conjunctivae pink, oropharynx clear. No thrush or mucositis.  Lymph Nodes: No cervical or supraclavicular lymphadenopathy  Lungs: clear to  auscultation bilaterally, no rales, wheezes, or rhonci  Heart: regular rate and rhythm  Abdomen: round, soft, non tender, positive bowel sounds  Musculoskeletal: No focal spinal tenderness, no peripheral edema  Neuro: non focal, well oriented, positive affect  Breasts: deferred   LAB RESULTS:  CMP     Component Value Date/Time   NA 136 08/12/2015 1310   NA 133* 06/11/2015 2304   K 4.2 08/12/2015 1310   K 4.7 06/11/2015 2304   CL 103 06/11/2015 2304   CL 102 06/01/2013 1500   CO2 26 08/12/2015 1310   CO2 24 06/11/2015 2304   GLUCOSE 95 08/12/2015 1310   GLUCOSE 120* 06/11/2015 2304   GLUCOSE 95 06/01/2013 1500   BUN 11.7 08/12/2015 1310   BUN 23* 06/11/2015 2304   CREATININE 0.9 08/12/2015 1310   CREATININE 1.02* 06/11/2015 2304   CALCIUM 9.9 08/12/2015 1310   CALCIUM 9.5 06/11/2015 2304   PROT 8.9* 08/12/2015 1310   PROT 9.2* 06/11/2015 2304   ALBUMIN 2.5* 08/12/2015 1310   ALBUMIN 2.9* 06/11/2015 2304   AST 20 08/12/2015 1310   AST 21 06/11/2015 2304   ALT 6 08/12/2015 1310   ALT 11* 06/11/2015 2304   ALKPHOS 132 08/12/2015 1310   ALKPHOS 109 06/11/2015 2304   BILITOT 0.53 08/12/2015 1310   BILITOT 0.2* 06/11/2015 2304   GFRNONAA >60 06/11/2015 2304   GFRAA >60 06/11/2015 2304    I No results found for: SPEP  Lab Results  Component Value Date   WBC 8.9 08/12/2015   NEUTROABS 6.4 08/12/2015   HGB 9.8* 08/12/2015   HCT 30.5* 08/12/2015   MCV 87.1 08/12/2015   PLT 387 08/12/2015      Chemistry      Component Value Date/Time   NA 136 08/12/2015 1310   NA 133* 06/11/2015 2304   K 4.2 08/12/2015 1310   K 4.7 06/11/2015 2304   CL 103 06/11/2015 2304   CL 102 06/01/2013 1500   CO2 26 08/12/2015  1310   CO2 24 06/11/2015 2304   BUN 11.7 08/12/2015 1310   BUN 23* 06/11/2015 2304   CREATININE 0.9 08/12/2015 1310   CREATININE 1.02* 06/11/2015 2304      Component Value Date/Time   CALCIUM 9.9 08/12/2015 1310   CALCIUM 9.5 06/11/2015 2304   ALKPHOS 132  08/12/2015 1310   ALKPHOS 109 06/11/2015 2304   AST 20 08/12/2015 1310   AST 21 06/11/2015 2304   ALT 6 08/12/2015 1310   ALT 11* 06/11/2015 2304   BILITOT 0.53 08/12/2015 1310   BILITOT 0.2* 06/11/2015 2304       Lab Results  Component Value Date   LABCA2 35 05/18/2013    No components found for: LABCA125   Recent Labs Lab 08/12/15 1311  INR 1.70*    Urinalysis    Component Value Date/Time   COLORURINE YELLOW 02/24/2015 1632    STUDIES: No results found.  ASSESSMENT: 44 y.o.  BRCA negative [place] woman with  triple negative metastatic breast cancer.  (0) genetic testing of the ATM, BARD1, BRCA1, BRCA2, BRIP1, CDH1, CHEK2, EPCAM, FANCC, MLH1, MSH2, MSH6, NBN, PALB2, PMS2, PTEN, RAD51C, RAD51D, STK11, TP53, and XRCC2 genes showed no deleterious mutations  (1) status post left breast biopsy 04/23/2013 for a clinical T4 N1, stage IIIB invasive ductal carcinoma, grade 3,  estrogen receptor 14% positive with moderate staining intensity, progesterone receptor negative,  with an MIB-1 of 100% and HER-2/neu  nonamplified  (2) biopsy of a left axillary lymph node [phone] was positive  (3) started monthly goserelin July of 02/01/2013  (4)  received  neoadjuvant chemotherapy with doxorubicin and cyclophosphamide in dose dense fashion x4, followed by carboplatin and paclitaxel x1, discontinued due to neuropathy, followed by carboplatin and gemcitabine, both given day 1 of each 14 day cycle x6, completed 10/12/2013 .   (5) right upper extremity DVT documented 06/29/2013,  treated with Lovenox from July  through December 2014    (6) status post left modified radical mastectomy 11/15/2013 for a residual pT3 pN0 invasive ductal carcinoma, grade 3, with repeat prognostic panel triple negative. All 12 axillary lymph nodes were clear. Margins were negative   (5) completed adjuvant radiation therapy 03/18/2014,  with capecitabine added 2-3 weeks into treatment.  METASTATIC DISEASE   (6) CT scans of the chest, abdomen, and pelvis 06/26/2014 showed a large right pleural effusion  with pleural studding, 2 left upper lobe nodules, and two  liver lesions. Cytology from right thoracentesis 07/03/2014 showed malignant cells consistent with the patient's known primary  (7) started eribulin 07/09/2014, given days 1 and 8 of each 21 day cycle; restagind studies after cycle 3 showed evidence of response. Discontinued after cycle 5 (last dose 10/08/14) because of worsening peripheral neuropathy--also with progression.   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 Antithrombin level was normal.  (12) to start liposomal doxorubicin (Doxil) 05/14/2015, canceled because of low ejection fraction  (13) started capecitabine1.5g BID, 05/20/15, 1 week on, 1 week off  (14) CHF with echo 05/21/2015 showing an ejection fraction of 25-30%  PLAN:  Chrishelle is tolerating the capecitabine well, and was to start her next cycle today, however she does not have any on hand to do so. We price checked a refill both at [facility] as well as [facility] mail order [facility], and she chose [facility] as the copay is only $3. Of course she may not receive this shipment for 2 or more days, she will start it when she does.   The labs were reviewed in detail and while her hgb is 9.8, we are going to hold off on her aranesp injection today because  it made her feel so poorly the last time. I am rewriting her oxycodone prescription so that she may take 1-2 tabs 3 times daily to control her pain. She is avoiding NSAIDs because she is on coumadin, and tylenol because of her liver mets. She will also try claritin daily, as this helps with neulata related bone pain and may be of some benefit to this current pain.  She meets with cardiology tomorrow, and I advised her to alert them of her low blood pressures. Changes may need to be made to her blood pressure medicines.   Iysis will return in 1 month for labs and a follow up visit. She understands and agrees with this plan. She knows the goal of treatment in her case is control. She has been encouraged to call with any issues that might arise before her next visit here.   [name], NP 08/12/2015 2:06 PM

## 2015-08-13 ENCOUNTER — Ambulatory Visit (HOSPITAL_COMMUNITY): Admission: RE | Admit: 2015-08-13 | Payer: No Typology Code available for payment source | Source: Ambulatory Visit

## 2015-08-13 ENCOUNTER — Encounter (HOSPITAL_COMMUNITY): Payer: No Typology Code available for payment source

## 2015-08-14 ENCOUNTER — Other Ambulatory Visit: Payer: Self-pay

## 2015-08-14 DIAGNOSIS — C50212 Malignant neoplasm of upper-inner quadrant of left female breast: Secondary | ICD-10-CM

## 2015-08-14 MED ORDER — PROCHLORPERAZINE MALEATE 10 MG PO TABS: 10.0000 mg | ORAL_TABLET | Freq: Three times a day (TID) | ORAL | Status: DC | PRN

## 2015-08-14 NOTE — Telephone Encounter (Signed)
Called patient at 08-15-2015 at 1315 to verify receipt of medication.  "I just received the medication Today."

## 2015-08-15 ENCOUNTER — Other Ambulatory Visit (HOSPITAL_BASED_OUTPATIENT_CLINIC_OR_DEPARTMENT_OTHER): Payer: No Typology Code available for payment source

## 2015-08-15 ENCOUNTER — Other Ambulatory Visit: Payer: Self-pay

## 2015-08-15 ENCOUNTER — Ambulatory Visit (HOSPITAL_BASED_OUTPATIENT_CLINIC_OR_DEPARTMENT_OTHER): Payer: No Typology Code available for payment source | Admitting: Nurse Practitioner

## 2015-08-15 ENCOUNTER — Telehealth: Payer: Self-pay | Admitting: *Deleted

## 2015-08-15 ENCOUNTER — Ambulatory Visit (HOSPITAL_COMMUNITY)
Admission: RE | Admit: 2015-08-15 | Discharge: 2015-08-15 | Disposition: A | Discharge status: Home / Self Care | Payer: No Typology Code available for payment source | Source: Ambulatory Visit | Attending: Nurse Practitioner | Admitting: Nurse Practitioner

## 2015-08-15 VITALS — BP 88/62 | HR 110 | Temp 98.7°F | Resp 18 | Ht 60.0 in | Wt 167.7 lb

## 2015-08-15 DIAGNOSIS — R1110 Vomiting, unspecified: Secondary | ICD-10-CM | POA: Diagnosis not present

## 2015-08-15 DIAGNOSIS — R112 Nausea with vomiting, unspecified: Secondary | ICD-10-CM

## 2015-08-15 DIAGNOSIS — E860 Dehydration: Secondary | ICD-10-CM | POA: Diagnosis not present

## 2015-08-15 DIAGNOSIS — C50212 Malignant neoplasm of upper-inner quadrant of left female breast: Secondary | ICD-10-CM

## 2015-08-15 DIAGNOSIS — Z7901 Long term (current) use of anticoagulants: Secondary | ICD-10-CM

## 2015-08-15 DIAGNOSIS — N289 Disorder of kidney and ureter, unspecified: Secondary | ICD-10-CM

## 2015-08-15 DIAGNOSIS — R109 Unspecified abdominal pain: Secondary | ICD-10-CM | POA: Insufficient documentation

## 2015-08-15 DIAGNOSIS — D72829 Elevated white blood cell count, unspecified: Secondary | ICD-10-CM | POA: Diagnosis not present

## 2015-08-15 DIAGNOSIS — G893 Neoplasm related pain (acute) (chronic): Secondary | ICD-10-CM

## 2015-08-15 LAB — COMPREHENSIVE METABOLIC PANEL (CC13)
ALBUMIN: 2.4 g/dL — AB (ref 3.5–5.0)
ALK PHOS: 138 U/L (ref 40–150)
ALT: 6 U/L (ref 0–55)
AST: 26 U/L (ref 5–34)
Anion Gap: 8 mEq/L (ref 3–11)
BUN: 31.9 mg/dL — ABNORMAL HIGH (ref 7.0–26.0)
CO2: 26 mEq/L (ref 22–29)
Calcium: 10.2 mg/dL (ref 8.4–10.4)
Chloride: 103 mEq/L (ref 98–109)
Creatinine: 1.9 mg/dL — ABNORMAL HIGH (ref 0.6–1.1)
EGFR: 36 mL/min/{1.73_m2} — AB (ref >90)
GLUCOSE: 104 mg/dL (ref 70–140)
POTASSIUM: 4.5 meq/L (ref 3.5–5.1)
SODIUM: 137 meq/L (ref 136–145)
Total Bilirubin: 0.6 mg/dL (ref 0.20–1.20)
Total Protein: 8.8 g/dL — ABNORMAL HIGH (ref 6.4–8.3)

## 2015-08-15 LAB — CBC WITH DIFFERENTIAL/PLATELET
BASO%: 0.4 % (ref 0.0–2.0)
BASOS ABS: 0.1 10*3/uL (ref 0.0–0.1)
EOS ABS: 0.1 10*3/uL (ref 0.0–0.5)
EOS%: 0.6 % (ref 0.0–7.0)
HCT: 28.7 % — ABNORMAL LOW (ref 34.8–46.6)
HEMOGLOBIN: 9.1 g/dL — AB (ref 11.6–15.9)
LYMPH%: 7.6 % — AB (ref 14.0–49.7)
MCH: 27.4 pg (ref 25.1–34.0)
MCHC: 31.8 g/dL (ref 31.5–36.0)
MCV: 86 fL (ref 79.5–101.0)
MONO#: 0.8 10*3/uL (ref 0.1–0.9)
MONO%: 5.5 % (ref 0.0–14.0)
NEUT#: 11.9 10*3/uL — ABNORMAL HIGH (ref 1.5–6.5)
NEUT%: 85.9 % — AB (ref 38.4–76.8)
Platelets: 414 10*3/uL — ABNORMAL HIGH (ref 145–400)
RBC: 3.33 10*6/uL — ABNORMAL LOW (ref 3.70–5.45)
RDW: 24.1 % — AB (ref 11.2–14.5)
WBC: 13.9 10*3/uL — ABNORMAL HIGH (ref 3.9–10.3)
lymph#: 1.1 10*3/uL (ref 0.9–3.3)

## 2015-08-15 MED ORDER — SODIUM CHLORIDE 0.9 % IV SOLN
1000.0000 mL | Freq: Once | INTRAVENOUS | Status: AC
  Administered 2015-08-15: 500 mL via INTRAVENOUS

## 2015-08-15 MED ORDER — HEPARIN SOD (PORK) LOCK FLUSH 100 UNIT/ML IV SOLN
500.0000 [IU] | Freq: Once | INTRAVENOUS | Status: AC
  Administered 2015-08-15: 500 [IU] via INTRAVENOUS
  Filled 2015-08-15: qty 5

## 2015-08-15 MED ORDER — SODIUM CHLORIDE 0.9 % IJ SOLN
10.0000 mL | Freq: Once | INTRAMUSCULAR | Status: AC
  Administered 2015-08-15: 10 mL via INTRAVENOUS
  Filled 2015-08-15: qty 10

## 2015-08-15 NOTE — Telephone Encounter (Signed)
COMPAZINE CALLED TO [facility]. NOTIFIED [facility] TO CANCEL COMPAZINE PRESCRIPTION.

## 2015-08-15 NOTE — Patient Instructions (Signed)
Dehydration, Adult Dehydration is when you lose more fluids from the body than you take in. Vital organs like the kidneys, brain, and heart cannot function without a proper amount of fluids and salt. Any loss of fluids from the body can cause dehydration.  CAUSES   Vomiting.  Diarrhea.  Excessive sweating.  Excessive urine output.  Fever. SYMPTOMS  Mild dehydration  Thirst.  Dry lips.  Slightly dry mouth. Moderate dehydration  Very dry mouth.  Sunken eyes.  Skin does not bounce back quickly when lightly pinched and released.  Dark urine and decreased urine production.  Decreased tear production.  Headache. Severe dehydration  Very dry mouth.  Extreme thirst.  Rapid, weak pulse (more than 100 beats per minute at rest).  Cold hands and feet.  Not able to sweat in spite of heat and temperature.  Rapid breathing.  Blue lips.  Confusion and lethargy.  Difficulty being awakened.  Minimal urine production.  No tears. DIAGNOSIS  Your caregiver will diagnose dehydration based on your symptoms and your exam. Blood and urine tests will help confirm the diagnosis. The diagnostic evaluation should also identify the cause of dehydration. TREATMENT  Treatment of mild or moderate dehydration can often be done at home by increasing the amount of fluids that you drink. It is best to drink small amounts of fluid more often. Drinking too much at one time can make vomiting worse. Refer to the home care instructions below. Severe dehydration needs to be treated at the hospital where you will probably be given intravenous (IV) fluids that contain water and electrolytes. HOME CARE INSTRUCTIONS   Ask your caregiver about specific rehydration instructions.  Drink enough fluids to keep your urine clear or pale yellow.  Drink small amounts frequently if you have nausea and vomiting.  Eat as you normally do.  Avoid:  Foods or drinks high in sugar.  Carbonated  drinks.  Juice.  Extremely hot or cold fluids.  Drinks with caffeine.  Fatty, greasy foods.  Alcohol.  Tobacco.  Overeating.  Gelatin desserts.  Wash your hands well to avoid spreading bacteria and viruses.  Only take over-the-counter or prescription medicines for pain, discomfort, or fever as directed by your caregiver.  Ask your caregiver if you should continue all prescribed and over-the-counter medicines.  Keep all follow-up appointments with your caregiver. SEEK MEDICAL CARE IF:  You have abdominal pain and it increases or stays in one area (localizes).  You have a rash, stiff neck, or severe headache.  You are irritable, sleepy, or difficult to awaken.  You are weak, dizzy, or extremely thirsty. SEEK IMMEDIATE MEDICAL CARE IF:   You are unable to keep fluids down or you get worse despite treatment.  You have frequent episodes of vomiting or diarrhea.  You have blood or green matter (bile) in your vomit.  You have blood in your stool or your stool looks black and tarry.  You have not urinated in 6 to 8 hours, or you have only urinated a small amount of very dark urine.  You have a fever.  You faint. MAKE SURE YOU:   Understand these instructions.  Will watch your condition.  Will get help right away if you are not doing well or get worse. Document Released: 11/29/2005 Document Revised: 02/21/2012 Document Reviewed: 07/19/2011 ExitCare Patient Information 2015 [employer]. This information is not intended to replace advice given to you by your health care provider. Make sure you discuss any questions you have with your health care   provider.  

## 2015-08-15 NOTE — Progress Notes (Signed)
Spoke with patient who continues with N/V since yesterday.  The compazine that was called into her pharmacy yesterday did not help her.  She states that she took the new medication and within and hour vomited.  Patient states that she vomited 4-5 times today and has been unable to eat or drink anything.  She has urinated only twice today.  Patient is also worried because she is unable to take her oral chemotherapy medication. Per Juliann Pulse, RN patient will have labs and see Selena Lesser, NP this afternoon.

## 2015-08-17 ENCOUNTER — Encounter: Payer: Self-pay | Admitting: Nurse Practitioner

## 2015-08-17 DIAGNOSIS — N289 Disorder of kidney and ureter, unspecified: Secondary | ICD-10-CM | POA: Insufficient documentation

## 2015-08-17 DIAGNOSIS — R112 Nausea with vomiting, unspecified: Secondary | ICD-10-CM | POA: Insufficient documentation

## 2015-08-17 DIAGNOSIS — D72829 Elevated white blood cell count, unspecified: Secondary | ICD-10-CM | POA: Insufficient documentation

## 2015-08-17 NOTE — Progress Notes (Signed)
SYMPTOM MANAGEMENT CLINIC   HPI: Erika [name] 44 y.o. female diagnosed with breast cancer; with liver metastasis.  Patient is status post mastectomy, chemotherapy, radiation therapy.  Currently undergoing Xeloda oral therapy and Aranesp injections.  Patient reports 2-3 day history of nausea, vomiting, and subsequent dehydration.  She reports minimal oral intake.  She denies any URI or UTI symptoms.  She denies any recent fevers or chills.  Patient also deals with chronic bone pain.  She typically wears a fentanyl patch and oxycodone for breakthrough pain.  Patient stated that she removed her fentanyl patch earlier this morning; worried that the pain patch was causing her nausea and vomiting-even though she has been wearing her pain patch for quite some time with no side effects.   HPI  ROS  Past Medical History  Diagnosis Date  . Breast cancer     Invasive High Grade Carcinoma  . Headache(784.0)     Hx: of Migraines  . Status post chemotherapy     Adriamycin and Cytoxan  . Anxiety     ANXIOUS ABOUT HAVEING BIG SURGERY - MASTECTOMY  . 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 DVT (deep venous thrombosis)     RT ARM-PT ON LOVENOX  . Hx of iron deficiency anemia 11/26/13  . S/P radiation therapy 02-04-14 to 03-18-14                               1) Left Chest Wall / 50 Gy in 25 fractions/ 2) Left Supraclavicular fossa/ 46 Gy in 23 fractions / 3) Left Posterior Axillary boost / 8.533 Gy in 23 fractions/ 4) Left Chest Wall Scar boost / 10 Gy in 5 fractions    Past Surgical History  Procedure Laterality Date  . Tubal ligation    . Portacath placement Right 05/22/2013    Procedure: INSERTION PORT-A-CATH;  Surgeon: [name], MD;  Location: [place];  Service: General;  Laterality: Right;  . Left breast needle core biopsy Left 05/21/13  . Left breast needle core  biopsy Left 06/22/13  . Mastectomy modified radical Left 11/15/2013    Procedure: LEFT MASTECTOMY MODIFIED RADICAL;   Surgeon: [name], MD;  Location: [facility];  Service: General;  Laterality: Left;    has DVT (deep venous thrombosis); S/P mastectomy; Breast cancer of upper-inner quadrant of left female breast; Hypokalemia; Neuropathy due to chemotherapeutic drug; Neoplasm related pain; Hand foot syndrome; Anemia in neoplastic disease; Thrombocytosis; Cancer associated pain; URI (upper respiratory infection); Long term current use of anticoagulant therapy; Renal vein thrombosis; Hypotension; Sepsis; UTI (lower urinary tract infection); Hematuria; Supratherapeutic INR; Pancytopenia due to antineoplastic chemotherapy; Malnutrition of moderate degree; Thrombosis of renal vein; Chemotherapy induced neutropenia; Sinusitis; Conjunctivitis; Dehydration; Elevated serum creatinine; Chemotherapy-induced cardiomyopathy; Nausea with vomiting; Leukocytosis; and Renal insufficiency on her problem list.    is allergic to doxycycline.    Medication List       This list is accurate as of: 08/15/15 11:59 PM.  Always use your most recent med list.               capecitabine 500 MG tablet  Commonly known as:  XELODA  Take 3 tablets (1,500 mg total) by mouth 2 (two) times daily after a meal.     carvedilol 6.25 MG tablet  Commonly known as:  COREG  Take 1 tablet (6.25 mg total) by mouth 2 (two) times daily with a meal.  cyclobenzaprine 5 MG tablet  Commonly known as:  FLEXERIL  TAKE 1 TABLET BY MOUTH THREE TIMES A DAY FOR MUSCLE SPASMS     fentaNYL 25 MCG/HR patch  Commonly known as:  [place] - dosed mcg/hr  Place 1 patch (25 mcg total) onto the skin every 3 (three) days.     fentaNYL 75 MCG/HR  Commonly known as:  DURAGESIC - dosed mcg/hr  Place 1 patch (75 mcg total) onto the skin every 3 (three) days.     gabapentin 300 MG capsule  Commonly known as:  NEURONTIN  Take 1 capsule (300 mg total) by mouth 3 (three) times daily.     lisinopril 10 MG tablet  Commonly known as:  PRINIVIL  Take 1 tablet  (10 mg total) by mouth daily.     loratadine 10 MG tablet  Commonly known as:  CLARITIN  Take 10 mg by mouth daily as needed for allergies.     LORazepam 0.5 MG tablet  Commonly known as:  ATIVAN  Take 1 tablet (0.5 mg total) by mouth every 6 (six) hours as needed for anxiety.     ondansetron 8 MG tablet  Commonly known as:  ZOFRAN  Take 1 tablet (8 mg total) by mouth 2 (two) times daily.     oxyCODONE 5 MG immediate release tablet  Commonly known as:  Oxy IR/ROXICODONE  1-2 tablets every 8 hrs as needed for severe or breakthrough pain     potassium chloride SA 20 MEQ tablet  Commonly known as:  K-DUR,KLOR-CON  Take 1 tablet (20 mEq total) by mouth 4 (four) times daily.     prochlorperazine 10 MG tablet  Commonly known as:  COMPAZINE  Take 1 tablet (10 mg total) by mouth every 8 (eight) hours as needed for nausea or vomiting.     warfarin 5 MG tablet  Commonly known as:  COUMADIN  Take 2.5 mg by mouth daily. Patient is getting regulated on Coumadin. Pt takes 2.5 mg M,W,F, all other days 1.25 mg (1/2 tablet).     zolpidem 5 MG tablet  Commonly known as:  AMBIEN  Take 1 tablet (5 mg total) by mouth at bedtime as needed for sleep.         PHYSICAL EXAMINATION  Oncology Vitals 08/15/2015 08/12/2015 07/29/2015 07/15/2015 07/01/2015 07/01/2015 07/01/2015  Height 152 cm - - 152 cm - 152 cm -  Weight 76.068 kg 76.068 kg - 78.79 kg - 76.34 kg 76.998 kg  Weight (lbs) 167 lbs 11 oz 167 lbs 11 oz - 173 lbs 11 oz - 168 lbs 5 oz 169 lbs 12 oz  BMI (kg/m2) 32.75 kg/m2 - - 33.92 kg/m2 - 32.87 kg/m2 -  Temp 98.7 98.9 98.3 98.5 - - -  Pulse 110 110 100 110 93 96 100  Resp 18 20 - 18 - 20 -  SpO2 98 95 - 97 - 100 98  BSA (m2) 1.79 m2 - - 1.83 m2 - 1.8 m2 -   BP Readings from Last 3 Encounters:  08/15/15 88/62  08/12/15 81/58  07/29/15 93/63    Physical Exam  Constitutional: She is oriented to person, place, and time and well-developed, well-nourished, and in no distress.  HENT:  Head:  Normocephalic and atraumatic.  Mouth/Throat: Oropharynx is clear and moist.  Eyes: Conjunctivae and EOM are normal. Pupils are equal, round, and reactive to light. Right eye exhibits no discharge. Left eye exhibits no discharge. No scleral icterus.  Neck: Normal range of motion.  Neck supple. No JVD present. No tracheal deviation present. No thyromegaly present.  Cardiovascular: Normal heart sounds and intact distal pulses.   Mild tachycardia.  Pulmonary/Chest: Effort normal and breath sounds normal. No respiratory distress. She has no wheezes. She has no rales. She exhibits no tenderness.  Abdominal: Soft. Bowel sounds are normal. She exhibits no distension and no mass. There is no tenderness. There is no rebound and no guarding.  Musculoskeletal: Normal range of motion. She exhibits no edema or tenderness.  Lymphadenopathy:    She has no cervical adenopathy.  Neurological: She is alert and oriented to person, place, and time. Gait normal.  Skin: Skin is warm and dry. No rash noted. No erythema. No pallor.  Psychiatric: Affect normal.  Nursing note and vitals reviewed.   LABORATORY DATA:. Appointment on 08/15/2015  Component Date Value Ref Range Status  . WBC 08/15/2015 13.9* 3.9 - 10.3 10e3/uL Final  . NEUT# 08/15/2015 11.9* 1.5 - 6.5 10e3/uL Final  . HGB 08/15/2015 9.1* 11.6 - 15.9 g/dL Final  . HCT 08/15/2015 28.7* 34.8 - 46.6 % Final  . Platelets 08/15/2015 414* 145 - 400 10e3/uL Final  . MCV 08/15/2015 86.0  79.5 - 101.0 fL Final  . MCH 08/15/2015 27.4  25.1 - 34.0 pg Final  . MCHC 08/15/2015 31.8  31.5 - 36.0 g/dL Final  . RBC 08/15/2015 3.33* 3.70 - 5.45 10e6/uL Final  . RDW 08/15/2015 24.1* 11.2 - 14.5 % Final  . lymph# 08/15/2015 1.1  0.9 - 3.3 10e3/uL Final  . MONO# 08/15/2015 0.8  0.1 - 0.9 10e3/uL Final  . Eosinophils Absolute 08/15/2015 0.1  0.0 - 0.5 10e3/uL Final  . Basophils Absolute 08/15/2015 0.1  0.0 - 0.1 10e3/uL Final  . NEUT% 08/15/2015 85.9* 38.4 - 76.8 %  Final  . LYMPH% 08/15/2015 7.6* 14.0 - 49.7 % Final  . MONO% 08/15/2015 5.5  0.0 - 14.0 % Final  . EOS% 08/15/2015 0.6  0.0 - 7.0 % Final  . BASO% 08/15/2015 0.4  0.0 - 2.0 % Final  . Sodium 08/15/2015 137  136 - 145 mEq/L Final  . Potassium 08/15/2015 4.5  3.5 - 5.1 mEq/L Final  . Chloride 08/15/2015 103  98 - 109 mEq/L Final  . CO2 08/15/2015 26  22 - 29 mEq/L Final  . Glucose 08/15/2015 104  70 - 140 mg/dl Final  . BUN 08/15/2015 31.9* 7.0 - 26.0 mg/dL Final  . Creatinine 08/15/2015 1.9* 0.6 - 1.1 mg/dL Final  . Total Bilirubin 08/15/2015 0.60  0.20 - 1.20 mg/dL Final  . Alkaline Phosphatase 08/15/2015 138  40 - 150 U/L Final  . AST 08/15/2015 26  5 - 34 U/L Final  . ALT 08/15/2015 6  0 - 55 U/L Final  . Total Protein 08/15/2015 8.8* 6.4 - 8.3 g/dL Final  . Albumin 08/15/2015 2.4* 3.5 - 5.0 g/dL Final  . Calcium 08/15/2015 10.2  8.4 - 10.4 mg/dL Final  . Anion Gap 08/15/2015 8  3 - 11 mEq/L Final  . EGFR 08/15/2015 36* >90 ml/min/1.73 m2 Final   eGFR is calculated using the CKD-EPI Creatinine Equation (2009)     RADIOGRAPHIC STUDIES: Dg Abd 1 View  08/15/2015   CLINICAL DATA:  Abdominal pain and vomiting today.  EXAM: ABDOMEN - 1 VIEW  COMPARISON:  05/01/2015 CT  FINDINGS: Moderate stool in the proximal colon is again noted.  There is no evidence of bowel obstruction, there is no evidence of bowel obstruction or pneumoperitoneum.  No suspicious calcifications  are identified.  No acute bony abnormalities are noted.  IMPRESSION: No evidence of acute abnormality.  Moderate stool in the proximal colon again noted.   Electronically Signed   By: Margarette Canada M.D.   On: 08/15/2015 17:25    ASSESSMENT/PLAN:    Breast cancer of upper-inner quadrant of left female breast 0) genetic testing of the ATM, BARD1, BRCA1, BRCA2, BRIP1, CDH1, CHEK2, EPCAM, FANCC, MLH1, MSH2, MSH6, NBN, PALB2, PMS2, PTEN, RAD51C, RAD51D, STK11, TP53, and XRCC2 genes showed no deleterious mutations  (1) status post  left breast biopsy 04/23/2013 for a clinical T4 N1, stage IIIB invasive ductal carcinoma, grade 3, estrogen receptor 14% positive with moderate staining intensity, progesterone receptor negative, with an MIB-1 of 100% and HER-2/neu nonamplified  (2) biopsy of a left axillary lymph node [phone] was positive  (3) started monthly goserelin July of 02/01/2013  (4) received neoadjuvant chemotherapy with doxorubicin and cyclophosphamide in dose dense fashion x4, followed by carboplatin and paclitaxel x1, discontinued due to neuropathy, followed by carboplatin and gemcitabine, both given day 1 of each 14 day cycle x6, completed 10/12/2013 .   (5) right upper extremity DVT documented 06/29/2013, treated with Lovenox from July through December 2014   (6) status post left modified radical mastectomy 11/15/2013 for a residual pT3 pN0 invasive ductal carcinoma, grade 3, with repeat prognostic panel triple negative. All 12 axillary lymph nodes were clear. Margins were negative   (5) completed adjuvant radiation therapy 03/18/2014, with capecitabine added 2-3 weeks into treatment.  METASTATIC DISEASE  (6) CT scans of the chest, abdomen, and pelvis 06/26/2014 showed a large right pleural effusion with pleural studding, 2 left upper lobe nodules, and two liver lesions. Cytology from right thoracentesis 07/03/2014 showed malignant cells consistent with the patient's known primary  (7) started eribulin 07/09/2014, given days 1 and 8 of each 21 day cycle; restagind studies after cycle 3 showed evidence of response. Discontinued after cycle 5 (last dose 10/08/14) because of worsening peripheral neuropathy--also with progression.   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 Antithrombin level was normal.  (12) to start liposomal doxorubicin (Doxil) 05/14/2015, canceled because of low ejection fraction  (13) started capecitabine1.5g BID, 05/20/15, 1 week on, 1 week off  (14) CHF with echo 05/21/2015 showing an ejection fraction of 25-30%  Patient is complaining of nausea, vomiting, and dehydration.  For the past few days.  She has had minimal oral intake.  Patient states that she vomited up her Xeloda oral chemotherapy today.  Patient plans to hold any further Xeloda until she is feeling better.  Patient was advised to restart her Xeloda oral therapy when she is feeling better.  Patient will also receive IV fluid rehydration while cancer Center today.  Patient will only receive 500 ML's normal saline IV fluid rehydration due to history of chronic congestive heart failure with ejection fraction between 25 and 30%.  Patient received her last Aranesp injection on 07/29/2015.  Patient is scheduled for repeat echo on 08/20/2015.  Patient is scheduled for labs and her next Aranesp injection on 08/26/2015.  She is scheduled for next follow-up visit on 09/02/2015.   Dehydration Patient reports nausea, vomiting, and subsequent dehydration.  For the past few days; despite taking Zofran at home.  Patient states she also has Compazine and Ativan at home; it has been minimally effective.  Patient does appear dehydrated; and will receive 500 ML's normal saline IV fluid rehydration while at the cancer Center today.  Patient was also encouraged to push fluids at home as much as possible.  Leukocytosis White count elevated to 13.9  today.  Patient is complaining of some nausea, vomiting, and dehydration.  For the past few days.  She denies any URI or UTI symptoms.  She denies any recent fevers or chills.  Patient will receive IV fluid rehydration today.  Patient was advised to call/return or go directly to the emergency department over the weekend if she develops any worsening symptoms whatsoever.  Long term current use of anticoagulant therapy Patient has a history of and upper extremity DVT and a renal vein thrombosis.  She continues to take Coumadin on a regular basis.  Nausea with vomiting Patient reports that she has been experiencing nausea, vomiting, and subsequent dehydration for the past 2-3 days.  She has been taking Ativan, Compazine, and Zofran with only minimal effectiveness.  She admits to poor oral intake.  Patient does appear dehydrated on exam today.  Due to patient's history of congestive heart failure in the past and low ejection fraction-patient will only receive 500 ML's normal saline IV fluid rehydration today.  Patient was encouraged to push fluids at home as well.  Renal insufficiency Creatinine has increased from 0.9 up to 1.9 today.  Most likely, this is secondary to dehydration.  Patient will be given IV fluid rehydration while cancer Center today; and was advised to push fluids.  Will continue to monitor closely.  Cancer associated pain Patient continues to complain of some mild, generalized bone pain.  She states that she typically wears a fentanyl patch and takes oxycodone for breakthrough pain.  However, patient states that she took her fentanyl patch off earlier this morning; stating that she thought that perhaps the pain patch was causing her nausea/vomiting.  With further questioning-confirm the patient has been wearing her Fentanyl patch for quite some time with no significant side effects.  Advised the patient we apply another fentanyl patch today for better pain management.  Patient  stated understanding of all instructions; and was in agreement with this plan of care. The patient knows to call the clinic with any problems, questions or concerns.   Review/collaboration with Dr. Jana Hakim regarding all aspects of patient's visit today.   Total time spent with patient was 25 minutes;  with greater than 75 percent of that time spent in face to face counseling regarding patient's symptoms,  and coordination of care and follow up.  Disclaimer:This dictation was prepared with Dragon/digital dictation along with [employer]. Any transcriptional errors that result from this process are unintentional.  Drue Second, NP 08/17/2015

## 2015-08-17 NOTE — Assessment & Plan Note (Signed)
Patient continues to complain of some mild, generalized bone pain.  She states that she typically wears a fentanyl patch and takes oxycodone for breakthrough pain.  However, patient states that she took her fentanyl patch off earlier this morning; stating that she thought that perhaps the pain patch was causing her nausea/vomiting.  With further questioning-confirm the patient has been wearing her Fentanyl patch for quite some time with no significant side effects.  Advised the patient we apply another fentanyl patch today for better pain management.

## 2015-08-17 NOTE — Assessment & Plan Note (Signed)
Patient reports nausea, vomiting, and subsequent dehydration.  For the past few days; despite taking Zofran at home.  Patient states she also has Compazine and Ativan at home; it has been minimally effective.  Patient does appear dehydrated; and will receive 500 ML's normal saline IV fluid rehydration while at the cancer Center today.  Patient was also encouraged to push fluids at home as much as possible.

## 2015-08-17 NOTE — Assessment & Plan Note (Signed)
Creatinine has increased from 0.9 up to 1.9 today.  Most likely, this is secondary to dehydration.  Patient will be given IV fluid rehydration while cancer Center today; and was advised to push fluids.  Will continue to monitor closely.

## 2015-08-17 NOTE — Assessment & Plan Note (Addendum)
0) genetic testing of the ATM, BARD1, BRCA1, BRCA2, BRIP1, CDH1, CHEK2, EPCAM, FANCC, MLH1, MSH2, MSH6, NBN, PALB2, PMS2, PTEN, RAD51C, RAD51D, STK11, TP53, and XRCC2 genes showed no deleterious mutations  (1) status post left breast biopsy 04/23/2013 for a clinical T4 N1, stage IIIB invasive ductal carcinoma, grade 3, estrogen receptor 14% positive with moderate staining intensity, progesterone receptor negative, with an MIB-1 of 100% and HER-2/neu nonamplified  (2) biopsy of a left axillary lymph node [phone] was positive  (3) started monthly goserelin July of 02/01/2013  (4) received neoadjuvant chemotherapy with doxorubicin and cyclophosphamide in dose dense fashion x4, followed by carboplatin and paclitaxel x1, discontinued due to neuropathy, followed by carboplatin and gemcitabine, both given day 1 of each 14 day cycle x6, completed 10/12/2013 .   (5) right upper extremity DVT documented 06/29/2013, treated with Lovenox from July through December 2014   (6) status post left modified radical mastectomy 11/15/2013 for a residual pT3 pN0 invasive ductal carcinoma, grade 3, with repeat prognostic panel triple negative. All 12 axillary lymph nodes were clear. Margins were negative   (5) completed adjuvant radiation therapy 03/18/2014, with capecitabine added 2-3 weeks into treatment.  METASTATIC DISEASE  (6) CT scans of the chest, abdomen, and pelvis 06/26/2014 showed a large right pleural effusion with pleural studding, 2 left upper lobe nodules, and two liver lesions. Cytology from right thoracentesis 07/03/2014 showed malignant cells consistent with the patient's known primary  (7) started eribulin 07/09/2014, given days 1 and 8 of each 21 day cycle; restagind studies after cycle 3 showed evidence of response. Discontinued after cycle 5 (last dose 10/08/14) because of worsening peripheral neuropathy--also with progression.   (8) referral to [facility] initiated 07/08/2014  (9)  peripheral neuropathy secondary to chemotherapy--improved with discontinuation of eribulin  (10) started cyclophosphamide, methotrexate, fluorouracil (CMF) 11/19/2014, repeated every 21 days (a) restaging scans 01/28/2015 shows stable disease (b) restaging scans 05/01/2015 show evidence of progression: CMF discontinued  (11) left renal vein thrombosis noted on scans 01/28/2015; on Coumadin as of 02/03/2015 (after heparin bridge) (a) hypercoagulable panel shows no lupus anticoagulant, no factor V Leiden or prothrombin gene mutations. Antithrombin level was normal.  (12) to start liposomal doxorubicin (Doxil) 05/14/2015, canceled because of low ejection fraction  (13) started capecitabine1.5g BID, 05/20/15, 1 week on, 1 week off  (14) CHF with echo 05/21/2015 showing an ejection fraction of 25-30%  Patient is complaining of nausea, vomiting, and dehydration.  For the past few days.  She has had minimal oral intake.  Patient states that she vomited up her Xeloda oral chemotherapy today.  Patient plans to hold any further Xeloda until she is feeling better.  Patient was advised to restart her Xeloda oral therapy when she is feeling better.  Patient will also receive IV fluid rehydration while cancer Center today.  Patient will only receive 500 ML's normal saline IV fluid rehydration due to history of chronic congestive heart failure with ejection fraction between 25 and 30%.  Patient received her last Aranesp injection on 07/29/2015.  Patient is scheduled for repeat echo on 08/20/2015.  Patient is scheduled for labs and her next Aranesp injection on 08/26/2015.  She is scheduled for next follow-up visit on 09/02/2015.

## 2015-08-17 NOTE — Assessment & Plan Note (Signed)
Patient reports that she has been experiencing nausea, vomiting, and subsequent dehydration for the past 2-3 days.  She has been taking Ativan, Compazine, and Zofran with only minimal effectiveness.  She admits to poor oral intake.  Patient does appear dehydrated on exam today.  Due to patient's history of congestive heart failure in the past and low ejection fraction-patient will only receive 500 ML's normal saline IV fluid rehydration today.  Patient was encouraged to push fluids at home as well.

## 2015-08-17 NOTE — Assessment & Plan Note (Signed)
White count elevated to 13.9 today.  Patient is complaining of some nausea, vomiting, and dehydration.  For the past few days.  She denies any URI or UTI symptoms.  She denies any recent fevers or chills.  Patient will receive IV fluid rehydration today.  Patient was advised to call/return or go directly to the emergency department over the weekend if she develops any worsening symptoms whatsoever.

## 2015-08-17 NOTE — Assessment & Plan Note (Signed)
Patient has a history of and upper extremity DVT and a renal vein thrombosis.  She continues to take Coumadin on a regular basis.

## 2015-08-19 ENCOUNTER — Telehealth: Payer: Self-pay

## 2015-08-19 NOTE — Telephone Encounter (Signed)
"  I am feeling better, just not 100% yet. Not ready to take a run in the park". She is able to keep food and drink down, she has restarted her medications. Instructed her to call if any problems recurr.

## 2015-08-20 ENCOUNTER — Ambulatory Visit (HOSPITAL_COMMUNITY)
Admission: RE | Admit: 2015-08-20 | Discharge: 2015-08-20 | Disposition: A | Discharge status: Home / Self Care | Payer: No Typology Code available for payment source | Source: Ambulatory Visit | Attending: Adult Health | Admitting: Adult Health

## 2015-08-20 DIAGNOSIS — E119 Type 2 diabetes mellitus without complications: Secondary | ICD-10-CM | POA: Insufficient documentation

## 2015-08-20 DIAGNOSIS — I10 Essential (primary) hypertension: Secondary | ICD-10-CM | POA: Diagnosis not present

## 2015-08-20 DIAGNOSIS — I509 Heart failure, unspecified: Secondary | ICD-10-CM | POA: Diagnosis not present

## 2015-08-20 DIAGNOSIS — G893 Neoplasm related pain (acute) (chronic): Secondary | ICD-10-CM

## 2015-08-20 NOTE — Progress Notes (Signed)
  Echocardiogram 2D Echocardiogram has been performed.  Darlina Sicilian M 08/20/2015, 2:29 PM

## 2015-08-25 ENCOUNTER — Telehealth: Payer: Self-pay | Admitting: *Deleted

## 2015-08-25 NOTE — Telephone Encounter (Signed)
Voicemail from patient asking what time appointment is tomorrow.   "AVS was thrown away."  Appointment information given.

## 2015-08-26 ENCOUNTER — Other Ambulatory Visit (HOSPITAL_BASED_OUTPATIENT_CLINIC_OR_DEPARTMENT_OTHER): Payer: No Typology Code available for payment source

## 2015-08-26 ENCOUNTER — Ambulatory Visit (HOSPITAL_BASED_OUTPATIENT_CLINIC_OR_DEPARTMENT_OTHER): Payer: No Typology Code available for payment source

## 2015-08-26 ENCOUNTER — Ambulatory Visit (HOSPITAL_BASED_OUTPATIENT_CLINIC_OR_DEPARTMENT_OTHER): Payer: Self-pay | Admitting: Pharmacist

## 2015-08-26 VITALS — BP 89/54 | HR 110 | Temp 98.9°F

## 2015-08-26 DIAGNOSIS — D6481 Anemia due to antineoplastic chemotherapy: Secondary | ICD-10-CM

## 2015-08-26 DIAGNOSIS — C50212 Malignant neoplasm of upper-inner quadrant of left female breast: Secondary | ICD-10-CM | POA: Diagnosis not present

## 2015-08-26 DIAGNOSIS — E860 Dehydration: Secondary | ICD-10-CM

## 2015-08-26 DIAGNOSIS — I823 Embolism and thrombosis of renal vein: Secondary | ICD-10-CM

## 2015-08-26 DIAGNOSIS — C50912 Malignant neoplasm of unspecified site of left female breast: Secondary | ICD-10-CM

## 2015-08-26 DIAGNOSIS — T451X5A Adverse effect of antineoplastic and immunosuppressive drugs, initial encounter: Secondary | ICD-10-CM

## 2015-08-26 LAB — PROTIME-INR
INR: 2.4 (ref 2.00–3.50)
Protime: 28.8 Seconds — ABNORMAL HIGH (ref 10.6–13.4)

## 2015-08-26 LAB — COMPREHENSIVE METABOLIC PANEL (CC13)
ALK PHOS: 143 U/L (ref 40–150)
ALT: 7 U/L (ref 0–55)
ANION GAP: 7 meq/L (ref 3–11)
AST: 25 U/L (ref 5–34)
Albumin: 2.1 g/dL — ABNORMAL LOW (ref 3.5–5.0)
BUN: 20.4 mg/dL (ref 7.0–26.0)
CO2: 28 meq/L (ref 22–29)
Calcium: 9.6 mg/dL (ref 8.4–10.4)
Chloride: 102 mEq/L (ref 98–109)
Creatinine: 2.1 mg/dL — ABNORMAL HIGH (ref 0.6–1.1)
EGFR: 32 mL/min/{1.73_m2} — AB (ref >90)
GLUCOSE: 92 mg/dL (ref 70–140)
POTASSIUM: 4.8 meq/L (ref 3.5–5.1)
SODIUM: 137 meq/L (ref 136–145)
Total Bilirubin: 0.35 mg/dL (ref 0.20–1.20)
Total Protein: 8.1 g/dL (ref 6.4–8.3)

## 2015-08-26 LAB — CBC WITH DIFFERENTIAL/PLATELET
BASO%: 0.1 % (ref 0.0–2.0)
BASOS ABS: 0 10*3/uL (ref 0.0–0.1)
EOS ABS: 0.2 10*3/uL (ref 0.0–0.5)
EOS%: 2.1 % (ref 0.0–7.0)
HCT: 27.1 % — ABNORMAL LOW (ref 34.8–46.6)
HGB: 8.5 g/dL — ABNORMAL LOW (ref 11.6–15.9)
LYMPH%: 13.5 % — AB (ref 14.0–49.7)
MCH: 27.4 pg (ref 25.1–34.0)
MCHC: 31.4 g/dL — ABNORMAL LOW (ref 31.5–36.0)
MCV: 87.4 fL (ref 79.5–101.0)
MONO#: 0.7 10*3/uL (ref 0.1–0.9)
MONO%: 7 % (ref 0.0–14.0)
NEUT#: 7.3 10*3/uL — ABNORMAL HIGH (ref 1.5–6.5)
NEUT%: 77.3 % — AB (ref 38.4–76.8)
PLATELETS: 411 10*3/uL — AB (ref 145–400)
RBC: 3.1 10*6/uL — AB (ref 3.70–5.45)
RDW: 21.9 % — ABNORMAL HIGH (ref 11.2–14.5)
WBC: 9.5 10*3/uL (ref 3.9–10.3)
lymph#: 1.3 10*3/uL (ref 0.9–3.3)

## 2015-08-26 LAB — POCT INR: INR: 2.4

## 2015-08-26 MED ORDER — DARBEPOETIN ALFA 200 MCG/0.4ML IJ SOSY
200.0000 ug | PREFILLED_SYRINGE | Freq: Once | INTRAMUSCULAR | Status: AC
  Administered 2015-08-26: 200 ug via SUBCUTANEOUS
  Filled 2015-08-26: qty 0.4

## 2015-08-26 NOTE — Progress Notes (Signed)
INR at goal today at 2.4 (goal 2-3) Erika [name] is doing well today She is still having issues with pain and her appetite She is eating but has no desire to eat No missed or extra doses No unusual bleeding or bruising [name] will start xeloda "week on" tomorrow Will slightly decrease dose in anticipation for xeloda interaction Plan: Slight decrease back to half tablet (1.25 mg) daily Recheck INR on 09/09/15.  Lab at 10am, MD visit at 10:30am, inj at 11:15am and coumadin clinic after wards at 11:30am if needed.  *anticoagulation monitoring plans to be discussed with Dr. Jana Hakim at this 9/27 visit as care will transition to Dr. Jana Hakim at this time.**

## 2015-08-26 NOTE — Patient Instructions (Signed)
INR at goal Slight decrease back to half tablet (1.25 mg) daily Recheck INR on 09/09/15.  Lab at 10am, MD visit at 10:30am, inj at 11:15am and coumadin clinic after wards at 11:30am if needed.  You will discuss anticoagulation monitoring with Dr. Jana Hakim at this 9/27 visit as care will transition to Dr. Jana Hakim at this time.

## 2015-09-02 ENCOUNTER — Ambulatory Visit (HOSPITAL_BASED_OUTPATIENT_CLINIC_OR_DEPARTMENT_OTHER)
Admission: RE | Admit: 2015-09-02 | Discharge: 2015-09-02 | Disposition: A | Discharge status: Home / Self Care | Payer: No Typology Code available for payment source | Source: Ambulatory Visit | Attending: Cardiology | Admitting: Cardiology

## 2015-09-02 ENCOUNTER — Encounter (HOSPITAL_COMMUNITY): Payer: Self-pay

## 2015-09-02 ENCOUNTER — Inpatient Hospital Stay (HOSPITAL_COMMUNITY)
Admission: EM | Admit: 2015-09-02 | Discharge: 2015-09-05 | DRG: 683 | Disposition: A | Discharge status: Home / Self Care | Payer: No Typology Code available for payment source | Attending: Student in an Organized Health Care Education/Training Program | Admitting: Student in an Organized Health Care Education/Training Program

## 2015-09-02 ENCOUNTER — Inpatient Hospital Stay (HOSPITAL_COMMUNITY): Payer: No Typology Code available for payment source

## 2015-09-02 ENCOUNTER — Other Ambulatory Visit: Payer: Self-pay

## 2015-09-02 VITALS — BP 64/1 | HR 108 | Wt 163.2 lb

## 2015-09-02 DIAGNOSIS — C50912 Malignant neoplasm of unspecified site of left female breast: Secondary | ICD-10-CM | POA: Diagnosis present

## 2015-09-02 DIAGNOSIS — Z79899 Other long term (current) drug therapy: Secondary | ICD-10-CM

## 2015-09-02 DIAGNOSIS — I9589 Other hypotension: Secondary | ICD-10-CM

## 2015-09-02 DIAGNOSIS — I427 Cardiomyopathy due to drug and external agent: Secondary | ICD-10-CM | POA: Diagnosis present

## 2015-09-02 DIAGNOSIS — I959 Hypotension, unspecified: Secondary | ICD-10-CM | POA: Diagnosis present

## 2015-09-02 DIAGNOSIS — N170 Acute kidney failure with tubular necrosis: Secondary | ICD-10-CM | POA: Diagnosis not present

## 2015-09-02 DIAGNOSIS — N179 Acute kidney failure, unspecified: Secondary | ICD-10-CM | POA: Insufficient documentation

## 2015-09-02 DIAGNOSIS — Z7901 Long term (current) use of anticoagulants: Secondary | ICD-10-CM

## 2015-09-02 DIAGNOSIS — C787 Secondary malignant neoplasm of liver and intrahepatic bile duct: Secondary | ICD-10-CM | POA: Diagnosis present

## 2015-09-02 DIAGNOSIS — Z9012 Acquired absence of left breast and nipple: Secondary | ICD-10-CM | POA: Diagnosis present

## 2015-09-02 DIAGNOSIS — Z86718 Personal history of other venous thrombosis and embolism: Secondary | ICD-10-CM

## 2015-09-02 DIAGNOSIS — T380X5A Adverse effect of glucocorticoids and synthetic analogues, initial encounter: Secondary | ICD-10-CM | POA: Diagnosis present

## 2015-09-02 DIAGNOSIS — R51 Headache: Secondary | ICD-10-CM

## 2015-09-02 DIAGNOSIS — E860 Dehydration: Secondary | ICD-10-CM | POA: Diagnosis present

## 2015-09-02 DIAGNOSIS — I10 Essential (primary) hypertension: Secondary | ICD-10-CM | POA: Diagnosis present

## 2015-09-02 DIAGNOSIS — I5022 Chronic systolic (congestive) heart failure: Secondary | ICD-10-CM | POA: Diagnosis not present

## 2015-09-02 DIAGNOSIS — G4700 Insomnia, unspecified: Secondary | ICD-10-CM | POA: Diagnosis present

## 2015-09-02 DIAGNOSIS — Z923 Personal history of irradiation: Secondary | ICD-10-CM

## 2015-09-02 DIAGNOSIS — R519 Headache, unspecified: Secondary | ICD-10-CM

## 2015-09-02 DIAGNOSIS — F419 Anxiety disorder, unspecified: Secondary | ICD-10-CM | POA: Diagnosis present

## 2015-09-02 DIAGNOSIS — G43909 Migraine, unspecified, not intractable, without status migrainosus: Secondary | ICD-10-CM | POA: Diagnosis present

## 2015-09-02 DIAGNOSIS — T451X5A Adverse effect of antineoplastic and immunosuppressive drugs, initial encounter: Secondary | ICD-10-CM | POA: Diagnosis present

## 2015-09-02 DIAGNOSIS — Z881 Allergy status to other antibiotic agents status: Secondary | ICD-10-CM

## 2015-09-02 DIAGNOSIS — D630 Anemia in neoplastic disease: Secondary | ICD-10-CM | POA: Diagnosis present

## 2015-09-02 DIAGNOSIS — Z8249 Family history of ischemic heart disease and other diseases of the circulatory system: Secondary | ICD-10-CM

## 2015-09-02 DIAGNOSIS — C7800 Secondary malignant neoplasm of unspecified lung: Secondary | ICD-10-CM | POA: Diagnosis present

## 2015-09-02 DIAGNOSIS — D72829 Elevated white blood cell count, unspecified: Secondary | ICD-10-CM | POA: Diagnosis present

## 2015-09-02 DIAGNOSIS — G620 Drug-induced polyneuropathy: Secondary | ICD-10-CM | POA: Diagnosis present

## 2015-09-02 HISTORY — DX: Secondary malignant neoplasm of unspecified lung: C78.00

## 2015-09-02 HISTORY — DX: Secondary malignant neoplasm of liver and intrahepatic bile duct: C78.7

## 2015-09-02 HISTORY — DX: Heart failure, unspecified: I50.9

## 2015-09-02 HISTORY — DX: Malignant neoplasm of unspecified site of left female breast: C50.912

## 2015-09-02 HISTORY — DX: Iron deficiency anemia, unspecified: D50.9

## 2015-09-02 LAB — COMPREHENSIVE METABOLIC PANEL
ALBUMIN: 2.3 g/dL — AB (ref 3.5–5.0)
ALK PHOS: 146 U/L — AB (ref 38–126)
ALT: 8 U/L — ABNORMAL LOW (ref 14–54)
AST: 25 U/L (ref 15–41)
Anion gap: 11 (ref 5–15)
BILIRUBIN TOTAL: 0.3 mg/dL (ref 0.3–1.2)
BUN: 35 mg/dL — AB (ref 6–20)
CALCIUM: 9.5 mg/dL (ref 8.9–10.3)
CO2: 28 mmol/L (ref 22–32)
Chloride: 96 mmol/L — ABNORMAL LOW (ref 101–111)
Creatinine, Ser: 4.71 mg/dL — ABNORMAL HIGH (ref 0.44–1.00)
GFR calc Af Amer: 12 mL/min — ABNORMAL LOW (ref >60)
GFR calc non Af Amer: 10 mL/min — ABNORMAL LOW (ref >60)
GLUCOSE: 116 mg/dL — AB (ref 65–99)
Potassium: 5.1 mmol/L (ref 3.5–5.1)
Sodium: 135 mmol/L (ref 135–145)
TOTAL PROTEIN: 9.7 g/dL — AB (ref 6.5–8.1)

## 2015-09-02 LAB — CBC WITH DIFFERENTIAL/PLATELET
BASOS ABS: 0 10*3/uL (ref 0.0–0.1)
Basophils Relative: 0 %
Eosinophils Absolute: 0.1 10*3/uL (ref 0.0–0.7)
Eosinophils Relative: 1 %
HEMATOCRIT: 31.2 % — AB (ref 36.0–46.0)
HEMOGLOBIN: 10 g/dL — AB (ref 12.0–15.0)
LYMPHS PCT: 11 %
Lymphs Abs: 1.4 10*3/uL (ref 0.7–4.0)
MCH: 28.4 pg (ref 26.0–34.0)
MCHC: 32.1 g/dL (ref 30.0–36.0)
MCV: 88.6 fL (ref 78.0–100.0)
MONOS PCT: 6 %
Monocytes Absolute: 0.8 10*3/uL (ref 0.1–1.0)
Neutro Abs: 10.6 10*3/uL — ABNORMAL HIGH (ref 1.7–7.7)
Neutrophils Relative %: 82 %
Platelets: 426 10*3/uL — ABNORMAL HIGH (ref 150–400)
RBC: 3.52 MIL/uL — AB (ref 3.87–5.11)
RDW: 21.9 % — ABNORMAL HIGH (ref 11.5–15.5)
WBC: 12.9 10*3/uL — AB (ref 4.0–10.5)

## 2015-09-02 LAB — I-STAT CG4 LACTIC ACID, ED: Lactic Acid, Venous: 1.7 mmol/L (ref 0.5–2.0)

## 2015-09-02 LAB — URINALYSIS, ROUTINE W REFLEX MICROSCOPIC
Glucose, UA: NEGATIVE mg/dL
Ketones, ur: NEGATIVE mg/dL
NITRITE: NEGATIVE
PH: 5 (ref 5.0–8.0)
Protein, ur: 100 mg/dL — AB
SPECIFIC GRAVITY, URINE: 1.014 (ref 1.005–1.030)
Urobilinogen, UA: 1 mg/dL (ref 0.0–1.0)

## 2015-09-02 LAB — URINE MICROSCOPIC-ADD ON

## 2015-09-02 LAB — PROTIME-INR
INR: 4.82 — ABNORMAL HIGH (ref 0.00–1.49)
PROTHROMBIN TIME: 43.7 s — AB (ref 11.6–15.2)

## 2015-09-02 LAB — BRAIN NATRIURETIC PEPTIDE: B Natriuretic Peptide: 21.7 pg/mL (ref 0.0–100.0)

## 2015-09-02 LAB — I-STAT TROPONIN, ED: TROPONIN I, POC: 0 ng/mL (ref 0.00–0.08)

## 2015-09-02 MED ORDER — SODIUM CHLORIDE 0.9 % IV BOLUS (SEPSIS)
500.0000 mL | Freq: Once | INTRAVENOUS | Status: AC
  Administered 2015-09-02: 500 mL via INTRAVENOUS

## 2015-09-02 MED ORDER — ONDANSETRON HCL 4 MG/2ML IJ SOLN: 4.0000 mg | Freq: Four times a day (QID) | INTRAMUSCULAR | Status: DC | PRN

## 2015-09-02 MED ORDER — ACETAMINOPHEN 650 MG RE SUPP: 650.0000 mg | Freq: Four times a day (QID) | RECTAL | Status: DC | PRN

## 2015-09-02 MED ORDER — SODIUM CHLORIDE 0.9 % IV SOLN
Freq: Once | INTRAVENOUS | Status: AC
  Administered 2015-09-02: 18:00:00 via INTRAVENOUS

## 2015-09-02 MED ORDER — WARFARIN - PHARMACIST DOSING INPATIENT: Freq: Every day | Status: DC

## 2015-09-02 MED ORDER — PROCHLORPERAZINE MALEATE 10 MG PO TABS
10.0000 mg | ORAL_TABLET | Freq: Three times a day (TID) | ORAL | Status: DC | PRN
  Filled 2015-09-02: qty 1

## 2015-09-02 MED ORDER — CAPECITABINE 500 MG PO TABS: 1500.0000 mg | ORAL_TABLET | Freq: Two times a day (BID) | ORAL | Status: DC

## 2015-09-02 MED ORDER — SENNOSIDES-DOCUSATE SODIUM 8.6-50 MG PO TABS: 1.0000 | ORAL_TABLET | Freq: Every evening | ORAL | Status: DC | PRN

## 2015-09-02 MED ORDER — PROMETHAZINE HCL 25 MG/ML IJ SOLN: 25.0000 mg | Freq: Four times a day (QID) | INTRAMUSCULAR | Status: DC | PRN

## 2015-09-02 MED ORDER — LORATADINE 10 MG PO TABS: 10.0000 mg | ORAL_TABLET | Freq: Every day | ORAL | Status: DC | PRN

## 2015-09-02 MED ORDER — SODIUM CHLORIDE 0.9 % IV SOLN
INTRAVENOUS | Status: AC
  Administered 2015-09-02 – 2015-09-03 (×2): via INTRAVENOUS

## 2015-09-02 MED ORDER — HEPARIN SODIUM (PORCINE) 5000 UNIT/ML IJ SOLN: 5000.0000 [IU] | Freq: Three times a day (TID) | INTRAMUSCULAR | Status: DC

## 2015-09-02 MED ORDER — ONDANSETRON HCL 4 MG/2ML IJ SOLN
4.0000 mg | Freq: Once | INTRAMUSCULAR | Status: AC
  Administered 2015-09-02: 4 mg via INTRAVENOUS
  Filled 2015-09-02: qty 2

## 2015-09-02 MED ORDER — ACETAMINOPHEN 325 MG PO TABS
650.0000 mg | ORAL_TABLET | Freq: Four times a day (QID) | ORAL | Status: DC | PRN
  Administered 2015-09-03 – 2015-09-04 (×2): 650 mg via ORAL
  Filled 2015-09-02 (×2): qty 2

## 2015-09-02 MED ORDER — SODIUM CHLORIDE 0.9 % IV BOLUS (SEPSIS): 1000.0000 mL | Freq: Once | INTRAVENOUS | Status: DC

## 2015-09-02 NOTE — ED Provider Notes (Signed)
CSN: [id number]     Arrival date & time 09/02/15  1544 History   First MD Initiated Contact with Patient 09/02/15 1623     Chief Complaint  Patient presents with  . Hypotension     (Consider location/radiation/quality/duration/timing/severity/associated sxs/prior Treatment) Patient is a 44 y.o. female presenting with vomiting.  Emesis Severity:  Moderate Duration:  1 day Timing:  Intermittent Number of daily episodes:  3 Quality:  Stomach contents Able to tolerate:  Liquids Progression:  Unchanged Chronicity:  New Recent urination:  Decreased Relieved by:  None tried Worsened by:  Nothing tried Ineffective treatments:  None tried Associated symptoms: no abdominal pain, no arthralgias, no chills, no diarrhea, no headaches, no myalgias and no sore throat   Associated symptoms comment:  Lightheaded hypotension   Past Medical History  Diagnosis Date  . Breast cancer     Invasive High Grade Carcinoma  . Headache(784.0)     Hx: of Migraines  . Status post chemotherapy     Adriamycin and Cytoxan  . Anxiety     ANXIOUS ABOUT HAVEING BIG SURGERY - MASTECTOMY  . 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 DVT (deep venous thrombosis)     RT ARM-PT ON LOVENOX  . Hx of iron deficiency anemia 11/26/13  . S/P radiation therapy 02-04-14 to 03-18-14                               1) Left Chest Wall / 50 Gy in 25 fractions/ 2) Left Supraclavicular fossa/ 46 Gy in 23 fractions / 3) Left Posterior Axillary boost / 8.533 Gy in 23 fractions/ 4) Left Chest Wall Scar boost / 10 Gy in 5 fractions   Past Surgical History  Procedure Laterality Date  . Tubal ligation    . Portacath placement Right 05/22/2013    Procedure: INSERTION PORT-A-CATH;  Surgeon: [name], MD;  Location: [facility];  Service: General;  Laterality: Right;  . Left breast needle core biopsy Left 05/21/13  . Left breast needle core  biopsy Left 06/22/13  . Mastectomy modified radical Left 11/15/2013    Procedure: LEFT  MASTECTOMY MODIFIED RADICAL;  Surgeon: [name], MD;  Location: [facility];  Service: General;  Laterality: Left;   Family History  Problem Relation Age of Onset  . Hypertension Mother   . Diabetes Father 42  . Stomach cancer Maternal Aunt   . Hypertension Maternal Grandmother   . Hypertension Maternal Grandfather   . Diabetes Paternal Grandmother   . Diabetes Paternal Grandfather   . Diabetes Paternal Uncle   . Diabetes Paternal Uncle   . Diabetes Paternal Uncle    Social History  Substance Use Topics  . Smoking status: Never Smoker   . Smokeless tobacco: Never Used  . Alcohol Use: No   OB History    No data available     Review of Systems  Constitutional: Negative for fever and chills.  HENT: Negative for congestion and sore throat.   Eyes: Negative for visual disturbance.  Respiratory: Negative for cough, shortness of breath and wheezing.   Cardiovascular: Negative for chest pain.  Gastrointestinal: Positive for nausea and vomiting. Negative for abdominal pain, diarrhea and constipation.  Genitourinary: Negative for dysuria, difficulty urinating and vaginal pain.  Musculoskeletal: Negative for myalgias and arthralgias.  Skin: Negative for rash.  Neurological: Positive for light-headedness. Negative for syncope and headaches.  Psychiatric/Behavioral: Negative for behavioral problems.  All  other systems reviewed and are negative.     Allergies  Doxycycline  Home Medications   Prior to Admission medications   Medication Sig Start Date End Date Taking? Authorizing Provider  capecitabine (XELODA) 500 MG tablet Take 3 tablets (1,500 mg total) by mouth 2 (two) times daily after a meal. 08/12/15  Yes [name], NP  carvedilol (COREG) 6.25 MG tablet Take 1 tablet (6.25 mg total) by mouth 2 (two) times daily with a meal. 08/12/15  Yes [name], NP  cyclobenzaprine (FLEXERIL) 5 MG tablet TAKE 1 TABLET BY MOUTH THREE TIMES A DAY FOR MUSCLE SPASMS 08/12/15   Yes [name], NP  fentaNYL (DURAGESIC - DOSED MCG/HR) 25 MCG/HR patch Place 1 patch (25 mcg total) onto the skin every 3 (three) days. 08/12/15  Yes [name], NP  fentaNYL (DURAGESIC - DOSED MCG/HR) 75 MCG/HR Place 1 patch (75 mcg total) onto the skin every 3 (three) days. 08/12/15  Yes [name], NP  gabapentin (NEURONTIN) 300 MG capsule Take 1 capsule (300 mg total) by mouth 3 (three) times daily. 04/15/15  Yes [name], MD  lisinopril (PRINIVIL) 10 MG tablet Take 1 tablet (10 mg total) by mouth daily. 08/12/15  Yes [name], NP  loratadine (CLARITIN) 10 MG tablet Take 10 mg by mouth daily as needed for allergies.   Yes Historical Provider, MD  LORazepam (ATIVAN) 0.5 MG tablet Take 1 tablet (0.5 mg total) by mouth every 6 (six) hours as needed for anxiety. 08/12/15  Yes [name], NP  ondansetron (ZOFRAN) 8 MG tablet Take 1 tablet (8 mg total) by mouth 2 (two) times daily. 08/12/15  Yes [name], NP  oxyCODONE (OXY IR/ROXICODONE) 5 MG immediate release tablet 1-2 tablets every 8 hrs as needed for severe or breakthrough pain Patient taking differently: Take 5 mg by mouth every 4 (four) hours as needed for moderate pain or severe pain. 1-2 tablets every 8 hrs as needed for severe or breakthrough pain 08/12/15  Yes [name], NP  potassium chloride SA (K-DUR,KLOR-CON) 20 MEQ tablet Take 1 tablet (20 mEq total) by mouth 4 (four) times daily. 07/01/15  Yes [name], NP  prochlorperazine (COMPAZINE) 10 MG tablet Take 1 tablet (10 mg total) by mouth every 8 (eight) hours as needed for nausea or vomiting. 08/14/15  Yes [name], MD  warfarin (COUMADIN) 5 MG tablet Take 1.25-2.5 mg by mouth daily at 6 PM. Patient is getting regulated on Coumadin. Pt takes 2.5 mg M,W,F, all other days 1.25 mg (1/2 tablet).   Yes Historical Provider, MD  zolpidem (AMBIEN) 5 MG tablet Take 1 tablet (5 mg total) by mouth at bedtime as needed for  sleep. Patient not taking: Reported on 09/02/2015 06/27/14   Minette Headland, NP   BP 66/49 mmHg  Pulse 102  Temp(Src) 98 F (36.7 C)  Resp 16  Ht 5' 5.5" (1.664 m)  Wt   SpO2 99%  LMP 05/30/2013 Physical Exam  Constitutional: She is oriented to person, place, and time. She appears well-developed and well-nourished. No distress.  HENT:  Head: Normocephalic and atraumatic.  Mouth/Throat: Mucous membranes are dry.  Eyes: EOM are normal.  Neck: Normal range of motion.  Cardiovascular: Regular rhythm and normal heart sounds.  Tachycardia present.   No murmur heard. Pulmonary/Chest: Effort normal and breath sounds normal. No respiratory distress. She has no wheezes.  Abdominal: Soft. There is no tenderness.  Musculoskeletal:  She exhibits no edema.  Neurological: She is alert and oriented to person, place, and time.  Skin: She is not diaphoretic.  Psychiatric: She has a normal mood and affect. Her behavior is normal.    ED Course  Procedures (including critical care time) Labs Review Labs Reviewed  CBC WITH DIFFERENTIAL/PLATELET - Abnormal; Notable for the following:    WBC 12.9 (*)    RBC 3.52 (*)    Hemoglobin 10.0 (*)    HCT 31.2 (*)    RDW 21.9 (*)    Platelets 426 (*)    Neutro Abs 10.6 (*)    All other components within normal limits  COMPREHENSIVE METABOLIC PANEL - Abnormal; Notable for the following:    Chloride 96 (*)    Glucose, Bld 116 (*)    BUN 35 (*)    Creatinine, Ser 4.71 (*)    Total Protein 9.7 (*)    Albumin 2.3 (*)    ALT 8 (*)    Alkaline Phosphatase 146 (*)    GFR calc non Af Amer 10 (*)    GFR calc Af Amer 12 (*)    All other components within normal limits  PROTIME-INR - Abnormal; Notable for the following:    Prothrombin Time 43.7 (*)    INR 4.82 (*)    All other components within normal limits  BRAIN NATRIURETIC PEPTIDE  URINALYSIS, ROUTINE W REFLEX MICROSCOPIC (NOT AT [facility])  I-STAT CG4 LACTIC ACID, ED  I-STAT TROPOININ, ED     Imaging Review No results found. I have personally reviewed and evaluated these images and lab results as part of my medical decision-making.   EKG Interpretation   Date/Time:  Tuesday September 02 2015 16:33:33 EDT Ventricular Rate:  104 PR Interval:  151 QRS Duration: 75 QT Interval:  349 QTC Calculation: 459 R Axis:   18 Text Interpretation:  Sinus tachycardia Abnormal R-wave progression, early  transition Nonspecific T abnormalities, diffuse leads Baseline wander in  lead(s) V4 V5 Nonspecific T wave abnormality Confirmed by Wyvonnia Dusky  MD,  STEPHEN [phone]) on 09/02/2015 5:02:31 PM      MDM   Final diagnoses:  AKI (acute kidney injury)  Hypotension, unspecified hypotension type     Patient is a 44 year old female with a history of breast cancer status post mastectomy and chemotherapy that resulted in a cardiomyopathy. Patient's current EF is 20-25%. Patient's had one day of emesis and decreased by mouth. Patient went to her heart failure clinic and there her blood pressure was 60/40 and was sent to the ED for further management. On arrival to the ED the patient's blood pressure is 66/49 however when I saw her in the room it was 75/60. Patient does appear to be clinically dehydrated with an increased heart rate and dry oromucosa. We will give the patient a small fluid bolus, check labs and reassess. She denies any abdominal pain.  Patient's workup significant for an AK I with a creatinine of 4. Patient's baseline is 0.1. We will admit patient to hospitalist for further management of care. Patient's blood pressure did improve after a 500 mL bolus. We will start maintenance fluid at this time.  Renne Musca, MD 09/02/15 [place], MD 09/03/15 [phone]

## 2015-09-02 NOTE — ED Notes (Signed)
Pt sent here from [facility] d/t BP in 60's.  Pt has h/o left mastectomy and on chemo and cardiomyopathy d/t chemo.  No dizziness, nausea, chest pain or shortness of breath.

## 2015-09-02 NOTE — Progress Notes (Signed)
ANTICOAGULATION CONSULT NOTE - Initial Consult  Pharmacy Consult for Coumadin Indication: VTE treatment  Allergies  Allergen Reactions  . Doxycycline Nausea And Vomiting    Patient Measurements: Height: 5' 5.5" (166.4 cm) Weight:  (weighed [facility]FC today 163 lbs 4 oz) IBW/kg (Calculated) : 58.15  Vital Signs: Temp: 98 F (36.7 C) (09/20 1605) BP: 77/49 mmHg (09/20 1800) Pulse Rate: 104 (09/20 1800)  Labs:  Recent Labs  09/02/15 1640  HGB 10.0*  HCT 31.2*  PLT 426*  LABPROT 43.7*  INR 4.82*  CREATININE 4.71*    Estimated Creatinine Clearance: 15.4 mL/min (by C-G formula based on Cr of 4.71).   Medical History: Past Medical History  Diagnosis Date  . Breast cancer     Invasive High Grade Carcinoma  . Headache(784.0)     Hx: of Migraines  . Status post chemotherapy     Adriamycin and Cytoxan  . Anxiety     ANXIOUS ABOUT HAVEING BIG SURGERY - MASTECTOMY  . 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 DVT (deep venous thrombosis)     RT ARM-PT ON LOVENOX  . Hx of iron deficiency anemia 11/26/13  . S/P radiation therapy 02-04-14 to 03-18-14                               1) Left Chest Wall / 50 Gy in 25 fractions/ 2) Left Supraclavicular fossa/ 46 Gy in 23 fractions / 3) Left Posterior Axillary boost / 8.533 Gy in 23 fractions/ 4) Left Chest Wall Scar boost / 10 Gy in 5 fractions   Assessment: 44 yo F presents on 9/20 with hypotension. Pt also has a hx of RUE DVT and is on coumadin. Patient takes Coumadin 1.25mg  daily exc 2.5mg  on MWF. Last dose was on 9/19. Pharmacy consulted to continue coumadin. INR on admit is supratherapeutic at 4.82. Hgb low at 10.0, plts 426. No s/s of bleed.  Goal of Therapy:  INR 2-3 Monitor platelets by anticoagulation protocol: Yes   Plan:  Hold coumadin today Monitor daily INR, CBC, s/s of bleed   BATCHELDER,NATHAN J 09/02/2015,6:55 PM

## 2015-09-02 NOTE — H&P (Signed)
Date: 09/02/2015               Patient Name:  Erika [name] MRN: [id number]  DOB: 09/03/1971 Age / Sex: 44 y.o., female   PCP: Everardo Beals, NP         Medical Service: Internal Medicine Teaching Service         Attending Physician: Dr. Axel Filler, MD    First Contact: Dr. Marlowe Sax Pager: [phone]  Second Contact: Dr. 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 Patient states she has chronic nausea due to chemotherapy and is currently taking Compazine and Zofran at home. States she vomited 1 week ago, which stopped and then she started vomiting again this morning. Also reports feeling feeling dizzy and having a dry mouth. Denies LOC. She attributes her vomiting to drinking orange fruit punch. States she went to her heart failure clinic this morning and was told to come to the hospital because her "kidney numbers were high."Patient was accompanied by her fiance who stated patient has a decreased appetite for the past 2 weeks and was short of breath at rest yesterday. Denies any dysuria, frequency, urgency, or hematuria. Denies any chest pain. Denies shortness of breath at present.   Meds: Current Facility-Administered Medications  Medication Dose Route Frequency Provider Last Rate Last Dose  . 0.9 %  sodium chloride infusion   Intravenous Continuous [name], MD      . acetaminophen (TYLENOL) tablet 650 mg  650 mg Oral Q6H PRN [name], MD       Or  . acetaminophen (TYLENOL) suppository 650 mg  650 mg Rectal Q6H PRN [name], MD      . loratadine (CLARITIN)  tablet 10 mg  10 mg Oral Daily PRN [name], MD      . prochlorperazine (COMPAZINE) tablet 10 mg  10 mg Oral Q8H PRN [name], MD      . promethazine (PHENERGAN) injection 25 mg  25 mg Intravenous Q6H PRN [name], MD      . senna-docusate (Senokot-S) tablet 1 tablet  1 tablet Oral QHS PRN [name], MD      . Derrill Memo ON 09/03/2015] Warfarin - Pharmacist Dosing Inpatient   Does not apply [place], [place] at 09/02/15 1903   Facility-Administered Medications Ordered in Other Encounters  Medication Dose Route Frequency Provider Last Rate Last Dose  . Darbepoetin Alfa (ARANESP) injection 200 mcg  200 mcg Subcutaneous Once [name], NP      . sodium chloride 0.9 % injection 10 mL  10 mL Intravenous PRN [name], MD   10 mL at 06/19/15 1330  . topical emolient (BIAFINE) emulsion   Topical Daily [name], MD        Allergies: Allergies as of 09/02/2015 - Review Complete 09/02/2015  Allergen Reaction Noted  . Doxycycline Nausea And Vomiting 12/03/2013   Past Medical History  Diagnosis Date  . Breast cancer     Invasive High Grade Carcinoma  . Headache(784.0)     Hx: of Migraines  .  Status post chemotherapy     Adriamycin and Cytoxan  . Anxiety     ANXIOUS ABOUT HAVEING BIG SURGERY - MASTECTOMY  . 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 DVT (deep venous thrombosis)     RT ARM-PT ON LOVENOX  . Hx of iron deficiency anemia 11/26/13  . S/P radiation therapy 02-04-14 to 03-18-14                               1) Left Chest Wall / 50 Gy in 25 fractions/ 2) Left Supraclavicular fossa/ 46 Gy in 23 fractions / 3) Left Posterior Axillary boost / 8.533 Gy in 23 fractions/ 4) Left Chest Wall Scar boost / 10 Gy in 5 fractions   Past Surgical History  Procedure Laterality Date  . Tubal ligation    . Portacath placement Right 05/22/2013    Procedure: INSERTION PORT-A-CATH;  Surgeon: [name], MD;  Location: [facility];  Service:  General;  Laterality: Right;  . Left breast needle core biopsy Left 05/21/13  . Left breast needle core  biopsy Left 06/22/13  . Mastectomy modified radical Left 11/15/2013    Procedure: LEFT MASTECTOMY MODIFIED RADICAL;  Surgeon: [name], MD;  Location: [facility];  Service: General;  Laterality: Left;   Family History  Problem Relation Age of Onset  . Hypertension Mother   . Diabetes Father 55  . Stomach cancer Maternal Aunt   . Hypertension Maternal Grandmother   . Hypertension Maternal Grandfather   . Diabetes Paternal Grandmother   . Diabetes Paternal Grandfather   . Diabetes Paternal Uncle   . Diabetes Paternal Uncle   . Diabetes Paternal Uncle    Social History   Social History  . Marital Status: Single    Spouse Name: N/A  . Number of Children: 3  . Years of Education: N/A   Occupational History  .     Social History Main Topics  . Smoking status: Never Smoker   . Smokeless tobacco: Never Used  . Alcohol Use: No  . Drug Use: No  . Sexual Activity: Yes   Other Topics Concern  . Not on file   Social History Narrative    Review of Systems: Review of Systems  Constitutional: Negative for fever and chills.  HENT: Negative for ear pain.   Eyes: Negative for blurred vision and pain.  Respiratory: Negative for cough, sputum production, shortness of breath and wheezing.   Cardiovascular: Negative for chest pain and leg swelling.  Gastrointestinal: Positive for nausea and vomiting. Negative for abdominal pain, diarrhea, constipation, blood in stool and melena.  Genitourinary: Negative for dysuria, urgency, frequency and hematuria.  Musculoskeletal: Negative for myalgias and joint pain.  Skin: Negative for itching and rash.  Neurological: Positive for dizziness. Negative for sensory change, focal weakness and headaches.    Physical Exam: Blood pressure 90/55, pulse 106, temperature 98.2 F (36.8 C), temperature source Oral, resp. rate 20, height 5' 5.5"  (1.664 m), weight 164 lb (74.39 kg), last menstrual period 05/30/2013, SpO2 100 %. Physical Exam  Constitutional: She is oriented to person, place, and time. No distress.  HENT:  Head: Normocephalic and atraumatic.  Mouth/Throat: Oropharynx is clear and moist.  Eyes: EOM are normal. Pupils are equal, round, and reactive to light.  Neck: Neck supple. No tracheal deviation present.  Cardiovascular: Normal rate, regular rhythm and intact distal pulses.  Exam reveals no gallop and no  friction rub.   No murmur heard. Pulmonary/Chest: Effort normal and breath sounds normal. No respiratory distress. She has no wheezes. She has no rales.  Abdominal: Soft. Bowel sounds are normal. She exhibits no distension. There is no tenderness. There is no rebound and no guarding.  Genitourinary:  No CVA tenderness   Musculoskeletal: She exhibits no edema or tenderness.  Neurological: She is alert and oriented to person, place, and time.  Skin: Skin is warm and dry.    Lab results: Basic Metabolic Panel:  Recent Labs  09/02/15 1640  NA 135  K 5.1  CL 96*  CO2 28  GLUCOSE 116*  BUN 35*  CREATININE 4.71*  CALCIUM 9.5   Liver Function Tests:  Recent Labs  09/02/15 1640  AST 25  ALT 8*  ALKPHOS 146*  BILITOT 0.3  PROT 9.7*  ALBUMIN 2.3*   CBC:  Recent Labs  09/02/15 1640  WBC 12.9*  NEUTROABS 10.6*  HGB 10.0*  HCT 31.2*  MCV 88.6  PLT 426*   Coagulation:  Recent Labs  09/02/15 1640  LABPROT 43.7*  INR 4.82*   Urinalysis:  Recent Labs  09/02/15 1815  COLORURINE YELLOW  LABSPEC 1.014  PHURINE 5.0  GLUCOSEU NEGATIVE  HGBUR TRACE*  BILIRUBINUR SMALL*  KETONESUR NEGATIVE  PROTEINUR 100*  UROBILINOGEN 1.0  NITRITE NEGATIVE  LEUKOCYTESUR SMALL*    Imaging results:  Dg Chest 2 View  09/02/2015   CLINICAL DATA:  Hypotensive at her Dr's office today. Pt has hx of left side breast CA with left side mastectomy. Radiation therapy ending in October 2015. Hx of DVT.  Nonsmoker.  EXAM: CHEST - 2 VIEW  COMPARISON:  CT 05/01/2015  FINDINGS: Left lung clear. Surgical clips in the left axilla. Heart size normal. Right subclavian port to the SVC RA junction.  Multiple peripheral right lung nodules. Right pleural thickening or loculated effusion. Significantly decreased right lung volume compared to left. No pneumothorax. Visualized skeletal structures are unremarkable.  IMPRESSION: 1. Multiple right lung nodules and extensive pleural disease as before. No acute/superimposed abnormality.   Electronically Signed   By: Lucrezia Europe M.D.   On: 09/02/2015 19:00    Other results: EKG text interpretation: Sinus tachycardia. Abnormal R-wave progression, early  Transition. Nonspecific T abnormalities, diffuse leads Baseline wander in  lead(s) V4 V5. Nonspecific T wave abnormality.   Assessment & Plan by Problem: Active Problems:   Hypotension  Hypotension and Acute Kidney Injury BP 66/49 on admission. Patient has been vomiting since morning and reports vomiting 1 week ago. History of decreased po intake for the past 2 weeks. As per epic records, she was started on Lisinopril and Coreg on 06/05/15 by Oncology. Patient's SCr is elevated (4.71) today, it was normal on 08/12/15. Creatinine has been rising since early this month - 1.9 on 08/15/15 and 2.1 on 08/26/15. Her rise in serum creatinine is most likely pre-renal in etiology from decreased BP secondary to meds Lisinopril and Coreg. In addition, echo from 08/20/15 showing EF of 25-30%. Patient was given a 500 cc bolus on normal saline in the ED. BP improved to 90/55 now. She also has leukocytosis (WBC 12.9), however, no infection as per UA and patient is afebrile. CBC also showing thrombocytosis (platelets 426). Hgb 10.6 today, was 8.5 on 08/26/15. Findings consistent with hemoconcentration in the setting of dehydration. CXR showing multiple R lung nodules and extensive pleural disease, no acute/ superimposed abnormality. EKG showing  sinus tachycardia and non-specific T wave abnormalities. Troponin negative.  -  Continue hydration with IVF: NS @100cc /hr -Promethazine 25 mg IV q6 prn nausea -Prochlorperazine oral 10 mg q8 prn nausea  -Repeat EKG in AM -Pending renal ultrasound  -Pending labs: CBC, Mag, renal function panel, urine Na and Cr  History of DVT: INR currently supra-therapeutic (4.82).  -HOLD coumadin  -Follow up INR in AM -Coumadin per pharmacy  Metastatic breast cancer Hx of metastatic breast cancer with s/p mastectomy, radiation therapy, and chemotherapy. Currently on Xeloda oral therapy and Aranesp injection.  -HOLD Xeloda in the setting of elevated SCr (4.71) -HOLD Fentanyl patch in the setting of hypotension -HOLD Oxycodone in the setting of hypotension  -Tylenol 650 mg q6 prn pain   Normocytic anemia: Hgb 10.6, MCV normal. Most likely anemia of chronic disease in the setting of cancer. -Pending: Ferritin, Iron, TIBC, reticulocytes   HTN: holding home meds Carvedilol 6.25 mg BID and Lisinopril 10 mg qd in the setting of hypotension  Chemo induced neuropathy -HOLD Neurontin for now    Insomnia -HOLD Zolpidem  Diet: Regular   DVT ppx: Subcutaneous Heparin   Code: FULL  Dispo: Disposition is deferred at this time, awaiting improvement of current medical problems. Anticipated discharge in approximately 1-2 day(s).   The patient does have a current PCP Everardo Beals, NP) and does need an [facility] hospital follow-up appointment after discharge.  The patient does not have transportation limitations that hinder transportation to clinic appointments.  Signed: [name], MD 09/02/2015, 8:14 PM

## 2015-09-02 NOTE — Progress Notes (Signed)
Patient ID: Erika [name], female   DOB: Jan 21, 1971, 44 y.o.   MRN: [id number] Referring Physician: Barbee Shropshire NP Primary Care: Primary Cardiologist: None  Oncologist: Dr Erika [name]  HPI: Erika [name] is a 44 year old with history of metastatic left breast cancer, ER 14%, PR negative, HER-2/neu negative , triple negative with a Ki-67 that approaches 100%, DVT RUE, iron deficiency anemia, S/P radiation chest wall 02/04/14 through 03/18/2014 referred to [place] [facility] by Dr Erika [name] due to reduced EF.   Today she is complaining of N/V. Started in the last 24 hours. Limited oral intake due N/V. Weight at home trending down. Dyspneic with steps. She is lightheaded with standing. No fever. Taking all medications.   -Received 4 cycles doxorubicin and cyclophosphamide in dose dense fashion x4, followed by carboplatin and paclitaxel x1, discontinued due to neuropathy, followed by carboplatin and gemcitabine, both given day 1 of each 14 day cycle x6, completed 10/12/2013 . -started cyclophosphamide, methotrexate, fluorouracil (CMF) 11/19/2014, repeated every 21 days.               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 Antithrombin level was normal. - to start liposomal doxorubicin (Doxil) 05/14/2015, canceled because of low ejection fraction - started capecitabine1.5g BID, 05/20/15, 1 week on, 1 week off.  ECHO 9/72016 EF 20-25%  ECHO 05/21/2015  With contrast EF 25-25%  ECHO 05/16/2015-EF 25-30% RV normal Lat s' 8.3 GLS -14% ECHO 05/2013 EF 55%  Lat  s' 9.0 No GLS   Labs 06/24/2015: K 3.7 Creatinine 1.1  Labs 08/26/2015: K 4.8 Creatinine 2.1    Past Medical History  Diagnosis Date  .  Breast cancer     Invasive High Grade Carcinoma  . Headache(784.0)     Hx: of Migraines  . Status post chemotherapy     Adriamycin and Cytoxan  . Anxiety     ANXIOUS ABOUT HAVEING BIG SURGERY - MASTECTOMY  . 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 DVT (deep venous thrombosis)     RT ARM-PT ON LOVENOX  . Hx of iron deficiency anemia 11/26/13  . S/P radiation therapy 02-04-14 to 03-18-14                               1) Left Chest Wall / 50 Gy in 25 fractions/ 2) Left Supraclavicular fossa/ 46 Gy in 23 fractions / 3) Left Posterior Axillary boost / 8.533 Gy in 23 fractions/ 4) Left Chest Wall Scar boost / 10 Gy in 5 fractions    Current Outpatient Prescriptions  Medication Sig Dispense Refill  . capecitabine (XELODA) 500 MG tablet Take 3 tablets (1,500 mg total) by mouth 2 (two) times daily after a meal. 84 tablet 3  . carvedilol (COREG) 6.25 MG tablet Take 1 tablet (6.25 mg total) by mouth 2 (two) times daily with a meal. 60 tablet 5  . cyclobenzaprine (FLEXERIL) 5 MG tablet TAKE 1 TABLET BY MOUTH THREE TIMES A DAY FOR MUSCLE SPASMS 30 tablet 1  . fentaNYL (DURAGESIC - DOSED MCG/HR) 25 MCG/HR patch Place 1 patch (25 mcg total) onto the skin every 3 (three) days. 10 patch 0  . fentaNYL (DURAGESIC - DOSED MCG/HR)  75 MCG/HR Place 1 patch (75 mcg total) onto the skin every 3 (three) days. 10 patch 0  . gabapentin (NEURONTIN) 300 MG capsule Take 1 capsule (300 mg total) by mouth 3 (three) times daily. 90 capsule 2  . lisinopril (PRINIVIL) 10 MG tablet Take 1 tablet (10 mg total) by mouth daily. 30 tablet 5  . loratadine (CLARITIN) 10 MG tablet Take 10 mg by mouth daily as needed for allergies.    Marland Kitchen LORazepam (ATIVAN) 0.5 MG tablet Take 1 tablet (0.5 mg total) by mouth every 6 (six) hours as needed for anxiety. 30 tablet 0  . ondansetron (ZOFRAN) 8 MG tablet Take 1 tablet (8 mg total) by mouth 2 (two) times daily. 20 tablet 1  . oxyCODONE (OXY IR/ROXICODONE) 5 MG immediate release tablet 1-2  tablets every 8 hrs as needed for severe or breakthrough pain 180 tablet 0  . potassium chloride SA (K-DUR,KLOR-CON) 20 MEQ tablet Take 1 tablet (20 mEq total) by mouth 4 (four) times daily. 120 tablet 1  . prochlorperazine (COMPAZINE) 10 MG tablet Take 1 tablet (10 mg total) by mouth every 8 (eight) hours as needed for nausea or vomiting. 30 tablet 0  . warfarin (COUMADIN) 5 MG tablet Take 2.5 mg by mouth daily. Patient is getting regulated on Coumadin. Pt takes 2.5 mg M,W,F, all other days 1.25 mg (1/2 tablet).    . zolpidem (AMBIEN) 5 MG tablet Take 1 tablet (5 mg total) by mouth at bedtime as needed for sleep. 30 tablet 0   No current facility-administered medications for this encounter.   Facility-Administered Medications Ordered in Other Encounters  Medication Dose Route Frequency Provider Last Rate Last Dose  . Darbepoetin Alfa (ARANESP) injection 200 mcg  200 mcg Subcutaneous Once [name], NP      . sodium chloride 0.9 % injection 10 mL  10 mL Intravenous PRN [name], MD   10 mL at 06/19/15 1330  . topical emolient (BIAFINE) emulsion   Topical Daily [name], MD        Allergies  Allergen Reactions  . Doxycycline Nausea And Vomiting      Social History   Social History  . Marital Status: Single    Spouse Name: N/A  . Number of Children: 3  . Years of Education: N/A   Occupational History  .     Social History Main Topics  . Smoking status: Never Smoker   . Smokeless tobacco: Never Used  . Alcohol Use: No  . Drug Use: No  . Sexual Activity: Yes   Other Topics Concern  . Not on file   Social History Narrative      Family History  Problem Relation Age of Onset  . Hypertension Mother   . Diabetes Father 55  . Stomach cancer Maternal Aunt   . Hypertension Maternal Grandmother   . Hypertension Maternal Grandfather   . Diabetes Paternal Grandmother   . Diabetes Paternal Grandfather   . Diabetes Paternal Uncle   . Diabetes Paternal Uncle    . Diabetes Paternal Uncle     Filed Vitals:   09/02/15 1503  BP: 64/1  Pulse: 108  Weight: 163 lb 4 oz (74.05 kg)  SpO2: 100%    PHYSICAL EXAM: General:  Chronically ill  appearing. No respiratory difficulty HEENT: normal Neck: supple. no JVD. Carotids 2+ bilat; no bruits. No lymphadenopathy or thryomegaly appreciated. Cor: PMI nondisplaced. Regular rate & rhythm. No rubs, gallops or murmurs. R upper chest porta  cath.  Lungs: clear Abdomen: soft, nontender, nondistended. No hepatosplenomegaly. No bruits or masses. Good bowel sounds. Extremities: no cyanosis, clubbing, rash, edema Neuro: alert & oriented x 3, cranial nerves grossly intact. moves all 4 extremities w/o difficulty. Affect pleasant.  ASSESSMENT & PLAN:  1. Chronic Systolic Heart Failure. Likely related to prior doxorubicin. Has not had ischemic work up. No chest pain. If occurs will need stress test. Newly reduced EF from 55% in 2014 to 25-30% in June 2016 and again this month.  NYHA II-III. Today, she seems dehydrated. SBP in 60s and having N/V.  Recent rise in creatinine.  - Stop carvedilol and lisinopril.  - Needs to go to ER for evaluation and IV fluid.  2. Metastatic breast cancer: Per Dr Erika [name], currently getting Xeloda.  3. RUE DVT: on coumadin.  4. N/V: Not sure if viral or related to Xeloda.  5. Hypotension: Suspect she is dehydrated. SBP 60s. Stop lisinopril and carvedilol   She will go to the ER.  Follow up in 1 week.    CLEGG,AMY,NP-C 3:19 PM   Patient was seen with NP, agree with the above note.  She has had nausea/vomiting today and minimal po intake.  She is lightheaded with standing.  She is hypotensive.  Creatinine has risen from 0.9 => 2.1 recently.  She is on Xeloda, which may be causing her nausea/vomiting.  Suspect dehydration.  - We will send her to the ER for evaluation.  She appears to need IV fluid.  - Stop Coreg and lisinopril for now.  - We will followup with her in a week or so.    Loralie Champagne 09/02/2015

## 2015-09-02 NOTE — ED Notes (Signed)
Will transfer to floor after xray

## 2015-09-03 ENCOUNTER — Inpatient Hospital Stay (HOSPITAL_COMMUNITY): Payer: No Typology Code available for payment source

## 2015-09-03 DIAGNOSIS — N179 Acute kidney failure, unspecified: Secondary | ICD-10-CM | POA: Diagnosis not present

## 2015-09-03 DIAGNOSIS — Z923 Personal history of irradiation: Secondary | ICD-10-CM

## 2015-09-03 DIAGNOSIS — Z86718 Personal history of other venous thrombosis and embolism: Secondary | ICD-10-CM

## 2015-09-03 DIAGNOSIS — C50919 Malignant neoplasm of unspecified site of unspecified female breast: Secondary | ICD-10-CM

## 2015-09-03 DIAGNOSIS — Z9012 Acquired absence of left breast and nipple: Secondary | ICD-10-CM | POA: Diagnosis present

## 2015-09-03 DIAGNOSIS — C787 Secondary malignant neoplasm of liver and intrahepatic bile duct: Secondary | ICD-10-CM | POA: Diagnosis present

## 2015-09-03 DIAGNOSIS — Z9221 Personal history of antineoplastic chemotherapy: Secondary | ICD-10-CM

## 2015-09-03 DIAGNOSIS — G4700 Insomnia, unspecified: Secondary | ICD-10-CM

## 2015-09-03 DIAGNOSIS — I427 Cardiomyopathy due to drug and external agent: Secondary | ICD-10-CM | POA: Diagnosis present

## 2015-09-03 DIAGNOSIS — C7800 Secondary malignant neoplasm of unspecified lung: Secondary | ICD-10-CM | POA: Diagnosis present

## 2015-09-03 DIAGNOSIS — I9589 Other hypotension: Secondary | ICD-10-CM | POA: Diagnosis not present

## 2015-09-03 DIAGNOSIS — C799 Secondary malignant neoplasm of unspecified site: Secondary | ICD-10-CM

## 2015-09-03 DIAGNOSIS — Z7901 Long term (current) use of anticoagulants: Secondary | ICD-10-CM | POA: Diagnosis not present

## 2015-09-03 DIAGNOSIS — I959 Hypotension, unspecified: Secondary | ICD-10-CM | POA: Diagnosis not present

## 2015-09-03 DIAGNOSIS — G43909 Migraine, unspecified, not intractable, without status migrainosus: Secondary | ICD-10-CM | POA: Diagnosis present

## 2015-09-03 DIAGNOSIS — F419 Anxiety disorder, unspecified: Secondary | ICD-10-CM | POA: Diagnosis present

## 2015-09-03 DIAGNOSIS — Z881 Allergy status to other antibiotic agents status: Secondary | ICD-10-CM | POA: Diagnosis not present

## 2015-09-03 DIAGNOSIS — D630 Anemia in neoplastic disease: Secondary | ICD-10-CM | POA: Diagnosis present

## 2015-09-03 DIAGNOSIS — E860 Dehydration: Secondary | ICD-10-CM | POA: Diagnosis not present

## 2015-09-03 DIAGNOSIS — G620 Drug-induced polyneuropathy: Secondary | ICD-10-CM | POA: Diagnosis present

## 2015-09-03 DIAGNOSIS — Z9010 Acquired absence of unspecified breast and nipple: Secondary | ICD-10-CM

## 2015-09-03 DIAGNOSIS — N170 Acute kidney failure with tubular necrosis: Secondary | ICD-10-CM | POA: Diagnosis present

## 2015-09-03 DIAGNOSIS — D649 Anemia, unspecified: Secondary | ICD-10-CM

## 2015-09-03 DIAGNOSIS — C50912 Malignant neoplasm of unspecified site of left female breast: Secondary | ICD-10-CM | POA: Diagnosis present

## 2015-09-03 DIAGNOSIS — I10 Essential (primary) hypertension: Secondary | ICD-10-CM | POA: Diagnosis present

## 2015-09-03 DIAGNOSIS — Z79899 Other long term (current) drug therapy: Secondary | ICD-10-CM | POA: Diagnosis not present

## 2015-09-03 DIAGNOSIS — T380X5A Adverse effect of glucocorticoids and synthetic analogues, initial encounter: Secondary | ICD-10-CM | POA: Diagnosis present

## 2015-09-03 DIAGNOSIS — D72829 Elevated white blood cell count, unspecified: Secondary | ICD-10-CM | POA: Diagnosis present

## 2015-09-03 DIAGNOSIS — I5022 Chronic systolic (congestive) heart failure: Secondary | ICD-10-CM | POA: Diagnosis not present

## 2015-09-03 DIAGNOSIS — Z8249 Family history of ischemic heart disease and other diseases of the circulatory system: Secondary | ICD-10-CM | POA: Diagnosis not present

## 2015-09-03 DIAGNOSIS — T451X5D Adverse effect of antineoplastic and immunosuppressive drugs, subsequent encounter: Secondary | ICD-10-CM

## 2015-09-03 DIAGNOSIS — T451X5A Adverse effect of antineoplastic and immunosuppressive drugs, initial encounter: Secondary | ICD-10-CM | POA: Diagnosis present

## 2015-09-03 LAB — BASIC METABOLIC PANEL
Anion gap: 7 (ref 5–15)
BUN: 23 mg/dL — AB (ref 6–20)
CHLORIDE: 105 mmol/L (ref 101–111)
CO2: 25 mmol/L (ref 22–32)
CREATININE: 1.85 mg/dL — AB (ref 0.44–1.00)
Calcium: 9.3 mg/dL (ref 8.9–10.3)
GFR calc Af Amer: 37 mL/min — ABNORMAL LOW (ref >60)
GFR calc non Af Amer: 32 mL/min — ABNORMAL LOW (ref >60)
Glucose, Bld: 124 mg/dL — ABNORMAL HIGH (ref 65–99)
Potassium: 5 mmol/L (ref 3.5–5.1)
SODIUM: 137 mmol/L (ref 135–145)

## 2015-09-03 LAB — SODIUM, URINE, RANDOM: Sodium, Ur: 88 mmol/L

## 2015-09-03 LAB — IRON AND TIBC
Iron: 13 ug/dL — ABNORMAL LOW (ref 28–170)
SATURATION RATIOS: 7 % — AB (ref 10.4–31.8)
TIBC: 183 ug/dL — AB (ref 250–450)
UIBC: 170 ug/dL

## 2015-09-03 LAB — RETICULOCYTES
RBC.: 2.87 MIL/uL — ABNORMAL LOW (ref 3.87–5.11)
RETIC CT PCT: 1.1 % (ref 0.4–3.1)
Retic Count, Absolute: 31.6 10*3/uL (ref 19.0–186.0)

## 2015-09-03 LAB — CBC
HCT: 28.6 % — ABNORMAL LOW (ref 36.0–46.0)
HEMATOCRIT: 25.1 % — AB (ref 36.0–46.0)
HEMOGLOBIN: 7.8 g/dL — AB (ref 12.0–15.0)
Hemoglobin: 9 g/dL — ABNORMAL LOW (ref 12.0–15.0)
MCH: 27.2 pg (ref 26.0–34.0)
MCH: 28 pg (ref 26.0–34.0)
MCHC: 31.1 g/dL (ref 30.0–36.0)
MCHC: 31.5 g/dL (ref 30.0–36.0)
MCV: 87.5 fL (ref 78.0–100.0)
MCV: 88.8 fL (ref 78.0–100.0)
PLATELETS: 409 10*3/uL — AB (ref 150–400)
Platelets: 389 10*3/uL (ref 150–400)
RBC: 2.87 MIL/uL — AB (ref 3.87–5.11)
RBC: 3.22 MIL/uL — AB (ref 3.87–5.11)
RDW: 22.2 % — ABNORMAL HIGH (ref 11.5–15.5)
RDW: 22 % — ABNORMAL HIGH (ref 11.5–15.5)
WBC: 9.1 10*3/uL (ref 4.0–10.5)
WBC: 11.9 10*3/uL — ABNORMAL HIGH (ref 4.0–10.5)

## 2015-09-03 LAB — RENAL FUNCTION PANEL
ALBUMIN: 1.9 g/dL — AB (ref 3.5–5.0)
ANION GAP: 9 (ref 5–15)
BUN: 30 mg/dL — ABNORMAL HIGH (ref 6–20)
CALCIUM: 8.5 mg/dL — AB (ref 8.9–10.3)
CO2: 23 mmol/L (ref 22–32)
Chloride: 103 mmol/L (ref 101–111)
Creatinine, Ser: 2.95 mg/dL — ABNORMAL HIGH (ref 0.44–1.00)
GFR, EST AFRICAN AMERICAN: 21 mL/min — AB (ref >60)
GFR, EST NON AFRICAN AMERICAN: 18 mL/min — AB (ref >60)
Glucose, Bld: 105 mg/dL — ABNORMAL HIGH (ref 65–99)
PHOSPHORUS: 4.3 mg/dL (ref 2.5–4.6)
POTASSIUM: 4.9 mmol/L (ref 3.5–5.1)
SODIUM: 135 mmol/L (ref 135–145)

## 2015-09-03 LAB — HIV ANTIBODY (ROUTINE TESTING W REFLEX): HIV SCREEN 4TH GENERATION: NONREACTIVE

## 2015-09-03 LAB — TSH: TSH: 0.511 u[IU]/mL (ref 0.350–4.500)

## 2015-09-03 LAB — CORTISOL: CORTISOL PLASMA: 16.2 ug/dL

## 2015-09-03 LAB — TYPE AND SCREEN
ABO/RH(D): A POS
Antibody Screen: NEGATIVE

## 2015-09-03 LAB — CREATININE, URINE, RANDOM: Creatinine, Urine: 66.06 mg/dL

## 2015-09-03 LAB — PROTIME-INR
INR: 5.75 (ref 0.00–1.49)
PROTHROMBIN TIME: 49.9 s — AB (ref 11.6–15.2)

## 2015-09-03 LAB — MAGNESIUM: MAGNESIUM: 1.9 mg/dL (ref 1.7–2.4)

## 2015-09-03 LAB — FERRITIN: Ferritin: 782 ng/mL — ABNORMAL HIGH (ref 11–307)

## 2015-09-03 LAB — TROPONIN I: Troponin I: <0.03 ng/mL (ref <0.031)

## 2015-09-03 MED ORDER — SODIUM CHLORIDE 0.9 % IV BOLUS (SEPSIS)
500.0000 mL | Freq: Once | INTRAVENOUS | Status: AC
Start: 2015-09-03 — End: 2015-09-03
  Administered 2015-09-03: 500 mL via INTRAVENOUS

## 2015-09-03 MED ORDER — DEXAMETHASONE SODIUM PHOSPHATE 4 MG/ML IJ SOLN
4.0000 mg | Freq: Once | INTRAMUSCULAR | Status: AC
  Administered 2015-09-03: 4 mg via INTRAVENOUS
  Filled 2015-09-03: qty 1

## 2015-09-03 MED ORDER — SODIUM CHLORIDE 0.9 % IV BOLUS (SEPSIS)
500.0000 mL | Freq: Once | INTRAVENOUS | Status: AC
  Administered 2015-09-03: 500 mL via INTRAVENOUS

## 2015-09-03 MED ORDER — COSYNTROPIN 0.25 MG IJ SOLR
0.2500 mg | Freq: Once | INTRAMUSCULAR | Status: AC
  Administered 2015-09-04: 0.25 mg via INTRAVENOUS
  Filled 2015-09-03: qty 0.25

## 2015-09-03 MED ORDER — DICLOFENAC SODIUM 1 % TD GEL
4.0000 g | TRANSDERMAL | Status: DC | PRN
Start: 2015-09-03 — End: 2015-09-05
  Administered 2015-09-03 – 2015-09-05 (×3): 4 g via TOPICAL
  Filled 2015-09-03: qty 100

## 2015-09-03 MED ORDER — PHYTONADIONE 5 MG PO TABS
5.0000 mg | ORAL_TABLET | Freq: Once | ORAL | Status: AC
  Administered 2015-09-03: 5 mg via ORAL
  Filled 2015-09-03: qty 1

## 2015-09-03 NOTE — Progress Notes (Signed)
BP 80/52 manual, pt resting in bed, no nausea, dizziness, shortness of breath or chest pain. Pt also noted to hae fentanyl patch from home on, patch removed. IMTS Dr. Marijean Bravo on call notified.

## 2015-09-03 NOTE — Progress Notes (Signed)
Called for manual blood pressure readings of 78/48 and 80/56. Episode of hypotension previously to 80/52 that did not improve after removal of fentanyl patching and a 500 cc NS bolus. She has remained asymptomatic overnight.  I went to evaluate the patient and she was alert and cooperative. She denied any light-headedness, shortness of breath, or orthopnea. Lungs were clear to ausculation bilaterally. Looking back at previous clinic visits (e.g., 07/01/2015), her BPs run as low as 86/63 and she remains largely asymptomatic. In the setting of EF 25-30%, will hold off on giving another 500 cc bolus for now.  She reports some "bone pain." However, to avoid worsening her hypotension, will avoid escalating pain control for now. Explained to patient that pain medication can lower blood pressure. She said she is doing okay with pain for now and can hold off.

## 2015-09-03 NOTE — Progress Notes (Signed)
CRITICAL VALUE ALERT  Critical value received:  INR 5.75  Date of notification:  09/03/2015  Time of notification:  0601  Critical value read back:Yes.    Nurse who received alert:  Ronnette Hila, RN  MD notified (1st page):  Dr. Marijean Bravo  Time of first page:  0604  Responding MD:  Dr. Marijean Bravo  Time MD responded:  06:06

## 2015-09-03 NOTE — Progress Notes (Signed)
Subjective: Patient seen and examined at bedside this morning. States she is doing much better. She is able to tolerate oral intake and is not nauseous anymore. No other complaints. Overnight events: Episode of hypotension (BP 80/52) that did not improve after removal of fentanyl patch patient had on from home and a 500 cc NS bolus.   Objective: Vital signs in last 24 hours: Filed Vitals:   09/03/15 0052 09/03/15 0136 09/03/15 0400 09/03/15 0404  BP: 80/52 80/54 80/56  78/48  Pulse:    107  Temp:    98.7 F (37.1 C)  TempSrc:    Oral  Resp:    18  Height:      Weight:      SpO2:    94%   Weight change:   Intake/Output Summary (Last 24 hours) at 09/03/15 1010 Last data filed at 09/03/15 5465  Gross per 24 hour  Intake    240 ml  Output    500 ml  Net   -260 ml   Physical Exam  Cardiovascular: Normal rate, regular rhythm and intact distal pulses.  Exam reveals no gallop and no friction rub.   No murmur heard. tachycardic  Pulmonary/Chest: Effort normal and breath sounds normal. No respiratory distress. She has no wheezes. She has no rales.  Abdominal: Soft. Bowel sounds are normal. She exhibits no distension. There is no tenderness.  Musculoskeletal: She exhibits no edema.  Skin: Skin is warm and dry.   Assessment/Plan: Active Problems:   Hypotension  Hypotension and Acute Kidney Injury BP 66/49 on admission. History of decreased po intake for the past 2 weeks secondary to nausea/ vomiting. As per epic records, she was started on Lisinopril and Coreg on 06/05/15 by Oncology. Patient's SCr is elevated (4.71) on admission, it was normal on 08/12/15. Creatinine has been rising since early this month - 1.9 on 08/15/15 and 2.1 on 08/26/15. Echo from 08/20/15 showing EF of 25-30%. Random urine Na is high (88) random urine Cr is low (66). Her acute kidney injury is most likely due to decreased perfusion of the kidneys - acute tubular necrosis vs cardiorenal syndrome. FENa 2.9% consistent  with intrarenal pathology (ATN). Patient received a 500 cc bolus of NS in the ED and was on NS @ 100 cc/hr for 10 hours. Overnight patient remained hypotensive BP 80/52 despite receiving another 500 cc bolus of IVF. BP 81/51 and HR 107 this am. Her Scr has trended down to 2.95. Leukocytosis and thrombocytosis have resolved. Hgb 8.5 on 08/26/15. It was 10.6 on admission likely due to hemoconcentration secondary to dehydration. After receiving fluids, hgb dropped to 7.8 now. Repeat EKG this am showing T wave inversions in leads V5 and avF. Troponin negative on admission.  -NS 500cc bolus over 4 hours, recheck BP q4 -Heart failure team consulted  -Promethazine 25 mg IV q6 prn nausea -Prochlorperazine oral 10 mg q8 prn nausea  -Repeat Troponin -Pending renal ultrasound   History of DVT: INR continues to be supra-therapeutic - 5.75 today.  -HOLD coumadin  -Vitamin K 5 mg oral once  -Follow up INR in AM  Metastatic breast cancer Hx of metastatic breast cancer s/p mastectomy, radiation therapy, and chemotherapy. Currently on Xeloda oral therapy and Aranesp injection.  -HOLD Xeloda in the setting of elevated SCr -HOLD Fentanyl patch in the setting of hypotension -HOLD Oxycodone in the setting of hypotension  -Tylenol 650 mg q6 prn pain   Normocytic anemia: Hgb 7.8, MCV normal. Iron low, TIBC low, Saturation  low, ferritin high. Findings consistent with anemia of chronic disease in the setting of cancer. Reticulocyte count low, likely secondary to bone marrow suppression from chemotherapy.  -Monitor daily CBC, goal Hgb >7  HTN: HOLD home meds Carvedilol 6.25 mg BID and Lisinopril 10 mg qd in the setting of hypotension  Chemo induced neuropathy -HOLD Neurontin for now   Insomnia -HOLD Zolpidem  Diet: Regular   DVT ppx: Subcutaneous Heparin   Code: FULL  Dispo: Disposition is deferred at this time, awaiting improvement of current medical problems.  Anticipated discharge in  approximately 1-2 day(s).   The patient does have a current PCP Everardo Beals, NP) and does need an [facility] hospital follow-up appointment after discharge.  The patient does not have transportation limitations that hinder transportation to clinic appointments.  .Services Needed at time of discharge: Y = Yes, Blank = No PT:   OT:   RN:   Equipment:   Other:     LOS: 1 day   [name], MD 09/03/2015, 10:10 AM

## 2015-09-03 NOTE — Consult Note (Signed)
Advanced Heart Failure Team Consult Note  Referring Physician: Dr Evette Doffing  Primary Physician: Primary Cardiologist: None Oncologist: Dr Jana Hakim   Reason for Consultation: Heart Failure   HPI:   Erika [name] is a 44 year old with history of metastatic left breast cancer, ER 14%, PR negative, HER-2/neu negative , triple negative with a Ki-67 that approaches 100%, DVT RUE, iron deficiency anemia, S/P radiation chest wall 02/04/14 through 03/18/2014 .   Breast Cancer Treatment Received 4 cycles doxorubicin and cyclophosphamide in dose dense fashion x4, followed by carboplatin and paclitaxel x1, discontinued due to neuropathy, followed by carboplatin and gemcitabine, both given day 1 of each 14 day cycle x6, completed 10/12/2013 . -started cyclophosphamide, methotrexate, fluorouracil (CMF) 11/19/2014, repeated every 21 days.   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 Antithrombin level was normal. - to start liposomal doxorubicin (Doxil) 05/14/2015, canceled because of low ejection fraction - started capecitabine1.5g BID, 05/20/15, 1 week on, 1 week off.  ECHO- 08/20/2015 EF 25-30%   Yesterday she was seen in the HF clinic for routine follow up. She presented with N/V and hypotension.  Said N/V started a a couple days ago. Of note after review of out patient lab work, creatinine was well above her baseline. SBP in 60s. BB and Ace stopped and she sent to [facility] ED for further evaluation.  On admit creatinine was higher --2.1>4.7 and INR 4.8.  She was given fluid bolus but remained hypotensive so she was admitted for further evaluation. She has continued on NS until earlier this  morning. Taking pos.   Feeling better today. Denies N/V     Creatinine 4.7>2.9  Hgb 10>7.8  INR 5.75       Review of Systems: [y] = yes, _0  = no   General: Weight gain _1 ; Weight loss _2 ; Anorexia _3 ; Fatigue [ Y]; Fever _4 ; Chills _5 ; Weakness [ Y]  Cardiac: Chest pain/pressure _6 ; Resting SOB _7 ; Exertional SOB _8 ; Orthopnea _9 ; Pedal Edema _10 ; Palpitations _11 ; Syncope _12 ; Presyncope _13 ; Paroxysmal nocturnal dyspnea_14   Pulmonary: Cough _15 ; Wheezing_16 ; Hemoptysis_17 ; Sputum _18 ; Snoring _19   GI: Vomiting[Y ]; Dysphagia_20 ; Melena_21 ; Hematochezia _22 ; Heartburn_23 ; Abdominal pain _24 ; Constipation _25 ; Diarrhea _26 ; BRBPR _27   GU: Hematuria_28 ; Dysuria _29 ; Nocturia_30   Vascular: Pain in legs with walking _31 ; Pain in feet with lying flat _32 ; Non-healing sores _33 ; Stroke _34 ; TIA _35 ; Slurred speech _36 ;  Neuro: Headaches_37 ; Vertigo_38 ; Seizures_39 ; Paresthesias_40 ;Blurred vision _41 ; Diplopia _42 ; Vision changes _43   Ortho/Skin: Arthritis _44 ; Joint pain [Y ]; Muscle pain _45 ; Joint swelling _46 ; Back Pain _47 ; Rash _48   Psych: Depression_49 ; Anxiety_50   Heme: Bleeding problems _51 ; Clotting disorders _52 ; Anemia _53   Endocrine: Diabetes _54 ; Thyroid dysfunction_55   Home Medications Prior to Admission medications   Medication Sig Start Date End Date Taking? Authorizing Provider  capecitabine (XELODA) 500 MG tablet Take 3 tablets (1,500 mg total) by mouth 2 (two) times daily after a meal. 08/12/15  Yes Heather  F Boelter, NP  carvedilol (COREG) 6.25 MG tablet Take 1 tablet (6.25 mg total) by mouth 2 (two) times daily with a meal. 08/12/15  Yes [name], NP  cyclobenzaprine (FLEXERIL) 5 MG tablet TAKE 1 TABLET BY MOUTH THREE TIMES A DAY FOR MUSCLE SPASMS 08/12/15  Yes [name], NP  fentaNYL (DURAGESIC - DOSED MCG/HR) 25 MCG/HR patch Place 1 patch (25 mcg total) onto the skin every 3 (three) days. 08/12/15  Yes [name], NP  fentaNYL  (DURAGESIC - DOSED MCG/HR) 75 MCG/HR Place 1 patch (75 mcg total) onto the skin every 3 (three) days. 08/12/15  Yes [name], NP  gabapentin (NEURONTIN) 300 MG capsule Take 1 capsule (300 mg total) by mouth 3 (three) times daily. 04/15/15  Yes [name], MD  lisinopril (PRINIVIL) 10 MG tablet Take 1 tablet (10 mg total) by mouth daily. 08/12/15  Yes [name], NP  loratadine (CLARITIN) 10 MG tablet Take 10 mg by mouth daily as needed for allergies.   Yes Historical Provider, MD  LORazepam (ATIVAN) 0.5 MG tablet Take 1 tablet (0.5 mg total) by mouth every 6 (six) hours as needed for anxiety. 08/12/15  Yes [name], NP  ondansetron (ZOFRAN) 8 MG tablet Take 1 tablet (8 mg total) by mouth 2 (two) times daily. 08/12/15  Yes [name], NP  oxyCODONE (OXY IR/ROXICODONE) 5 MG immediate release tablet 1-2 tablets every 8 hrs as needed for severe or breakthrough pain Patient taking differently: Take 5 mg by mouth every 4 (four) hours as needed for moderate pain or severe pain. 1-2 tablets every 8 hrs as needed for severe or breakthrough pain 08/12/15  Yes [name], NP  potassium chloride SA (K-DUR,KLOR-CON) 20 MEQ tablet Take 1 tablet (20 mEq total) by mouth 4 (four) times daily. 07/01/15  Yes [name], NP  prochlorperazine (COMPAZINE) 10 MG tablet Take 1 tablet (10 mg total) by mouth every 8 (eight) hours as needed for nausea or vomiting. 08/14/15  Yes [name], MD  warfarin (COUMADIN) 5 MG tablet Take 1.25-2.5 mg by mouth daily at 6 PM. Patient is getting regulated on Coumadin. Pt takes 2.5 mg M,W,F, all other days 1.25 mg (1/2 tablet).   Yes Historical Provider, MD  zolpidem (AMBIEN) 5 MG tablet Take 1 tablet (5 mg total) by mouth at bedtime as needed for sleep. Patient not taking: Reported on 09/02/2015 06/27/14   Minette Headland, NP    Past Medical History: Past Medical History  Diagnosis Date  . Headache(784.0)     Hx: of Migraines  .  Anxiety     ANXIOUS ABOUT HAVEING BIG SURGERY - MASTECTOMY  . 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 DVT (deep venous thrombosis)     RT ARM-PT ON LOVENOX  . S/P radiation therapy 02-04-14 to 03-18-14                               1) Left Chest Wall / 50 Gy in 25 fractions/ 2) Left Supraclavicular fossa/ 46 Gy in 23 fractions / 3) Left Posterior Axillary boost / 8.533 Gy in 23 fractions/ 4) Left Chest Wall Scar boost / 10 Gy in 5 fractions  . CHF (congestive heart failure)   . Iron deficiency anemia 11/26/13  . Cancer of left breast     Invasive High Grade Carcinoma; S/P mastectomy, Adriamycin  and Cytoxan, radiation  . Metastases to the liver   . Metastasis to lung     Past Surgical History: Past Surgical History  Procedure Laterality Date  . Portacath placement Right 05/22/2013    Procedure: INSERTION PORT-A-CATH;  Surgeon: [name], MD;  Location: [facility] OR;  Service: General;  Laterality: Right;  . Breast surgery Left 05/21/13; 06/22/13     Needle Core Biopsy  . Mastectomy modified radical Left 11/15/2013    Procedure: LEFT MASTECTOMY MODIFIED RADICAL;  Surgeon: [name], MD;  Location: [facility] ORS;  Service: General;  Laterality: Left;  . Tubal ligation  08/1996    Family History: Family History  Problem Relation Age of Onset  . Hypertension Mother   . Diabetes Father 18  . Stomach cancer Maternal Aunt   . Hypertension Maternal Grandmother   . Hypertension Maternal Grandfather   . Diabetes Paternal Grandmother   . Diabetes Paternal Grandfather   . Diabetes Paternal Uncle   . Diabetes Paternal Uncle   . Diabetes Paternal Uncle     Social History: Social History   Social History  . Marital Status: Single    Spouse Name: N/A  . Number of Children: 3  . Years of Education: N/A   Occupational History  .     Social History Main Topics  . Smoking status: Never Smoker   . Smokeless tobacco: Never Used  . Alcohol Use: No  . Drug Use: No  . Sexual Activity:  Not Currently   Other Topics Concern  . None   Social History Narrative    Allergies:  Allergies  Allergen Reactions  . Doxycycline Nausea And Vomiting    Objective:    Vital Signs:   Temp:  [98 F (36.7 C)-98.7 F (37.1 C)] 98.7 F (37.1 C) (09/21 0404) Pulse Rate:  [101-107] 107 (09/21 0404) Resp:  [14-23] 18 (09/21 0404) BP: (63-90)/(40-56) 78/48 mmHg (09/21 0404) SpO2:  [92 %-100 %] 94 % (09/21 0404) Weight:  [164 lb (74.39 kg)] 164 lb (74.39 kg) (09/20 1911) Last BM Date: 09/02/15  Weight change: Filed Weights   09/02/15 1911  Weight: 164 lb (74.39 kg)    Intake/Output:   Intake/Output Summary (Last 24 hours) at 09/03/15 1033 Last data filed at 09/03/15 3300  Gross per 24 hour  Intake    240 ml  Output    500 ml  Net   -260 ml     Physical Exam: General:  Chronically ill appearing. No resp difficulty. In bed.  HEENT: normal Neck: supple. JVP flat  . Carotids 2+ bilat; no bruits. No lymphadenopathy or thryomegaly appreciated. Cor: PMI nondisplaced. Tachy Regular rate & rhythm. No rubs, gallops or murmurs. Lungs: clear Abdomen: soft, nontender, nondistended. No hepatosplenomegaly. No bruits or masses. Good bowel sounds. Extremities: no cyanosis, clubbing, rash, edema Neuro: alert & orientedx3, cranial nerves grossly intact. moves all 4 extremities w/o difficulty. Affect pleasant  Telemetry: Sinus Tach 100s  Labs: Basic Metabolic Panel:  Recent Labs Lab 09/02/15 1640 09/03/15 0437  NA 135 135  K 5.1 4.9  CL 96* 103  CO2 28 23  GLUCOSE 116* 105*  BUN 35* 30*  CREATININE 4.71* 2.95*  CALCIUM 9.5 8.5*  MG  --  1.9  PHOS  --  4.3    Liver Function Tests:  Recent Labs Lab 09/02/15 1640 09/03/15 0437  AST 25  --   ALT 8*  --   ALKPHOS 146*  --   BILITOT 0.3  --  PROT 9.7*  --   ALBUMIN 2.3* 1.9*   No results for input(s): LIPASE, AMYLASE in the last 168 hours. No results for input(s): AMMONIA in the last 168 hours.  CBC:  Recent  Labs Lab 09/02/15 1640 09/03/15 0437  WBC 12.9* 9.1  NEUTROABS 10.6*  --   HGB 10.0* 7.8*  HCT 31.2* 25.1*  MCV 88.6 87.5  PLT 426* 389    Cardiac Enzymes: No results for input(s): CKTOTAL, CKMB, CKMBINDEX, TROPONINI in the last 168 hours.  BNP: BNP (last 3 results)  Recent Labs  09/02/15 1634  BNP 21.7    ProBNP (last 3 results) No results for input(s): PROBNP in the last 8760 hours.   CBG: No results for input(s): GLUCAP in the last 168 hours.  Coagulation Studies:  Recent Labs  09/02/15 1640 09/03/15 0437  LABPROT 43.7* 49.9*  INR 4.82* 5.75*    Other results: EKG: Sinus Tach 107 bpm   Imaging: Dg Chest 2 View  09/02/2015   CLINICAL DATA:  Hypotensive at her Dr's office today. Pt has hx of left side breast CA with left side mastectomy. Radiation therapy ending in October 2015. Hx of DVT. Nonsmoker.  EXAM: CHEST - 2 VIEW  COMPARISON:  CT 05/01/2015  FINDINGS: Left lung clear. Surgical clips in the left axilla. Heart size normal. Right subclavian port to the SVC RA junction.  Multiple peripheral right lung nodules. Right pleural thickening or loculated effusion. Significantly decreased right lung volume compared to left. No pneumothorax. Visualized skeletal structures are unremarkable.  IMPRESSION: 1. Multiple right lung nodules and extensive pleural disease as before. No acute/superimposed abnormality.   Electronically Signed   By: Lucrezia Europe M.D.   On: 09/02/2015 19:00      Medications:     Current Medications: . Warfarin - Pharmacist Dosing Inpatient   Does not apply q1800     Infusions:      Assessment/Plan   Erika [name] is a 44 year old with as history of metastatic breast cancer, previously treated with doxorubicin, new reduced LV dysfunction 2016 (EF 25-30%) admitted with hypotension.   1. Hypotension- Remains hypotensive. Keep off BB and Ace  2. AKI--Creatinine on admit 4.7 but improving with fluid resuscitation. Creatinine today 2.9. Follow  renal function closely.  3. Metastatic Breast Cancer 4. H/O DVT RUE- on coumadin but currently on hold with elevated INR 5.7. Received 5 mg Vit today 5. 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 ECHO EF 25-30%. No diuretics. No BB or Ace with hypotension and AKI.  6. Anemia - Hgb 10> 7.8  Iron 13 TIBC 183 Ferritin 782 . No obvious source of bleeding.    Length of Stay: 1  CLEGG,AMY NP-C  09/03/2015, 10:33 AM  Advanced Heart Failure Team Pager [phone] (M-F; 7a - 4p)  Please contact [facility] for night-coverage after hours (4p -7a ) and weekends on [email or web address]  Patient seen with NP, agree with the above note.  SBP in 80s today, low but some improvement.   Patient developed hypotension and AKI in the setting of nausea/vomiting/poor po intake and ongoing use of lisinopril and Coreg. Suspect etiology is dehydratin. She has been getting chemotherapy with Xeloda which may have triggered the nausea/vomiting. For now, would continue to hold Coreg and ACEI.  Would continue with gentle IV fluid, creatinine improving.    Loralie Champagne 09/03/2015 11:46 AM

## 2015-09-03 NOTE — Progress Notes (Signed)
Pt given 565ml bolus per MD order, BP re-check 78/48, 80/56 checked by two RN. Pt still asymptomatic. Pt is c/o generalized chronic pain. Pt states that tylenol will not help pain. Pt educated that MD is holding certain pain medicines due to her BP. Pt verbalized understanding and requested that RN ask MD if there are any other pain medicine options.   Dr. Marijean Bravo notified and will come assess pt at bedside

## 2015-09-03 NOTE — Progress Notes (Addendum)
[place] for Coumadin Indication: VTE treatment  Allergies  Allergen Reactions  . Doxycycline Nausea And Vomiting    Patient Measurements: Height: 5' 5.5" (166.4 cm) Weight: 164 lb (74.39 kg) IBW/kg (Calculated) : 58.15  Vital Signs: Temp: 98.7 F (37.1 C) (09/21 0404) Temp Source: Oral (09/21 0404) BP: 78/48 mmHg (09/21 0404) Pulse Rate: 107 (09/21 0404)  Labs:  Recent Labs  09/02/15 1640 09/03/15 0437  HGB 10.0* 7.8*  HCT 31.2* 25.1*  PLT 426* 389  LABPROT 43.7* 49.9*  INR 4.82* 5.75*  CREATININE 4.71* 2.95*    Estimated Creatinine Clearance: 24.6 mL/min (by C-G formula based on Cr of 2.95).   Medical History: Past Medical History  Diagnosis Date  . Headache(784.0)     Hx: of Migraines  . Anxiety     ANXIOUS ABOUT HAVEING BIG SURGERY - MASTECTOMY  . 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 DVT (deep venous thrombosis)     RT ARM-PT ON LOVENOX  . S/P radiation therapy 02-04-14 to 03-18-14                               1) Left Chest Wall / 50 Gy in 25 fractions/ 2) Left Supraclavicular fossa/ 46 Gy in 23 fractions / 3) Left Posterior Axillary boost / 8.533 Gy in 23 fractions/ 4) Left Chest Wall Scar boost / 10 Gy in 5 fractions  . CHF (congestive heart failure)   . Iron deficiency anemia 11/26/13  . Cancer of left breast     Invasive High Grade Carcinoma; S/P mastectomy, Adriamycin and Cytoxan, radiation  . Metastases to the liver   . Metastasis to lung    Assessment: 44 yo F presents on 9/20 with hypotension. Pt also has a hx of RUE DVT and is on coumadin. Patient takes Coumadin 1.25mg  daily exc 2.5mg  on MWF. Last dose was on 9/19. Pharmacy consulted to continue coumadin. INR on admit is supratherapeutic at 4.82now increased to 5.75. Hgb down to 7.8, plts to wnl. No s/s of bleed. S/p vit k 5mg  po on 9/21   Goal of Therapy:  INR 2-3 Monitor platelets by anticoagulation protocol: Yes   Plan:  Hold coumadin  tonight Monitor daily INR, CBC, s/s of bleed  Elicia Lamp, PharmD Clinical Pharmacist Pager [phone] 09/03/2015 10:16 AM

## 2015-09-04 ENCOUNTER — Inpatient Hospital Stay (HOSPITAL_COMMUNITY): Payer: No Typology Code available for payment source

## 2015-09-04 DIAGNOSIS — N179 Acute kidney failure, unspecified: Secondary | ICD-10-CM | POA: Insufficient documentation

## 2015-09-04 DIAGNOSIS — I9589 Other hypotension: Secondary | ICD-10-CM

## 2015-09-04 DIAGNOSIS — I5022 Chronic systolic (congestive) heart failure: Secondary | ICD-10-CM

## 2015-09-04 LAB — CBC
HCT: 28.2 % — ABNORMAL LOW (ref 36.0–46.0)
HEMOGLOBIN: 8.7 g/dL — AB (ref 12.0–15.0)
MCH: 26.9 pg (ref 26.0–34.0)
MCHC: 30.9 g/dL (ref 30.0–36.0)
MCV: 87.3 fL (ref 78.0–100.0)
Platelets: 475 10*3/uL — ABNORMAL HIGH (ref 150–400)
RBC: 3.23 MIL/uL — AB (ref 3.87–5.11)
RDW: 21.7 % — ABNORMAL HIGH (ref 11.5–15.5)
WBC: 12.9 10*3/uL — AB (ref 4.0–10.5)

## 2015-09-04 LAB — BASIC METABOLIC PANEL
Anion gap: 9 (ref 5–15)
BUN: 19 mg/dL (ref 6–20)
CALCIUM: 9.6 mg/dL (ref 8.9–10.3)
CO2: 22 mmol/L (ref 22–32)
Chloride: 106 mmol/L (ref 101–111)
Creatinine, Ser: 1.28 mg/dL — ABNORMAL HIGH (ref 0.44–1.00)
GFR calc Af Amer: 58 mL/min — ABNORMAL LOW (ref >60)
GFR, EST NON AFRICAN AMERICAN: 50 mL/min — AB (ref >60)
GLUCOSE: 104 mg/dL — AB (ref 65–99)
POTASSIUM: 4.8 mmol/L (ref 3.5–5.1)
Sodium: 137 mmol/L (ref 135–145)

## 2015-09-04 LAB — RENAL FUNCTION PANEL
ALBUMIN: 2 g/dL — AB (ref 3.5–5.0)
Anion gap: 8 (ref 5–15)
BUN: 20 mg/dL (ref 6–20)
CALCIUM: 9.5 mg/dL (ref 8.9–10.3)
CO2: 23 mmol/L (ref 22–32)
Chloride: 106 mmol/L (ref 101–111)
Creatinine, Ser: 1.3 mg/dL — ABNORMAL HIGH (ref 0.44–1.00)
GFR calc Af Amer: 57 mL/min — ABNORMAL LOW (ref >60)
GFR calc non Af Amer: 49 mL/min — ABNORMAL LOW (ref >60)
GLUCOSE: 110 mg/dL — AB (ref 65–99)
PHOSPHORUS: 2.7 mg/dL (ref 2.5–4.6)
Potassium: 4.7 mmol/L (ref 3.5–5.1)
SODIUM: 137 mmol/L (ref 135–145)

## 2015-09-04 LAB — PROTIME-INR
INR: 2.09 — AB (ref 0.00–1.49)
PROTHROMBIN TIME: 23.3 s — AB (ref 11.6–15.2)

## 2015-09-04 LAB — ACTH STIMULATION, 3 TIME POINTS
CORTISOL 30 MIN: 14.9 ug/dL
CORTISOL 60 MIN: 19.5 ug/dL
CORTISOL BASE: 2.6 ug/dL

## 2015-09-04 LAB — MAGNESIUM: Magnesium: 1.7 mg/dL (ref 1.7–2.4)

## 2015-09-04 MED ORDER — DEXAMETHASONE SODIUM PHOSPHATE 4 MG/ML IJ SOLN
4.0000 mg | Freq: Once | INTRAMUSCULAR | Status: AC
  Administered 2015-09-04: 4 mg via INTRAVENOUS
  Filled 2015-09-04: qty 1

## 2015-09-04 MED ORDER — WARFARIN - PHARMACIST DOSING INPATIENT: Freq: Every day | Status: DC

## 2015-09-04 MED ORDER — WARFARIN SODIUM 1 MG PO TABS
1.0000 mg | ORAL_TABLET | Freq: Once | ORAL | Status: AC
  Administered 2015-09-04: 1 mg via ORAL
  Filled 2015-09-04: qty 1

## 2015-09-04 MED ORDER — MAGNESIUM SULFATE 2 GM/50ML IV SOLN
2.0000 g | Freq: Once | INTRAVENOUS | Status: AC
Start: 2015-09-04 — End: 2015-09-04
  Administered 2015-09-04: 2 g via INTRAVENOUS
  Filled 2015-09-04: qty 50

## 2015-09-04 MED ORDER — SENNOSIDES-DOCUSATE SODIUM 8.6-50 MG PO TABS
1.0000 | ORAL_TABLET | Freq: Every day | ORAL | Status: DC
  Administered 2015-09-05: 1 via ORAL
  Filled 2015-09-04: qty 1

## 2015-09-04 MED ORDER — OXYCODONE HCL 5 MG PO TABS
5.0000 mg | ORAL_TABLET | Freq: Four times a day (QID) | ORAL | Status: DC | PRN
  Administered 2015-09-04 – 2015-09-05 (×3): 5 mg via ORAL
  Filled 2015-09-04 (×3): qty 1

## 2015-09-04 NOTE — Progress Notes (Signed)
Subjective: Patient seen and examined at bedside this morning. She is able to tolerate oral intake and is not complaining of any nausea. Denies any having any headaches, chest pain, SOB, or abdominal pain. States she is walking to the bathroom and is not dizzy anymore. No other complaints.  Objective: Vital signs in last 24 hours: Filed Vitals:   09/04/15 0017 09/04/15 0700 09/04/15 0810 09/04/15 1145  BP: 97/64 105/73 110/74 101/71  Pulse: 90 91 82 88  Temp: 97.6 F (36.4 C) 98.1 F (36.7 C) 98.2 F (36.8 C) 97.9 F (36.6 C)  TempSrc: Oral Oral Oral Oral  Resp: 20 16 16 18   Height:      Weight:  166 lb 6.4 oz (75.479 kg)    SpO2: 97% 100% 100% 100%   Weight change: 2 lb 6.4 oz (1.089 kg)  Intake/Output Summary (Last 24 hours) at 09/04/15 1424 Last data filed at 09/04/15 0900  Gross per 24 hour  Intake    420 ml  Output   1250 ml  Net   -830 ml   Physical Exam  Cardiovascular: Normal rate, regular rhythm and intact distal pulses.  Exam reveals no gallop and no friction rub.   No murmur heard. Pulmonary/Chest: Effort normal and breath sounds normal. No respiratory distress. She has no wheezes. She has no rales.  Abdominal: Soft. Bowel sounds are normal. She exhibits no distension. There is no tenderness.  Musculoskeletal: She exhibits no edema.  Skin: Skin is warm and dry.   Assessment/Plan: Active Problems:   Anemia in neoplastic disease   Long term current use of anticoagulant therapy   Hypotension   Chronic systolic CHF (congestive heart failure)   AKI (acute kidney injury)  Hypotension and Acute Kidney Injury Patient was hypotensive yesterday despite receiving boluses of 500 cc NS. Random serum cortisol was 16.2, which is not an appropriate response for an acutely ill individual. Patient was given Dexamethasone 4 mg IV yesterday. BP has remained stable, 110/74 this AM. She likely had refractory hypotension and as such was incompletely fluid responsive. 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 ACTH stimulation test this AM showing a 60 minute cortisol value of 19.5. Acute renal failure most likely due to ATN, her FENa was 2.9%. Her acute kidney injury was likely due to hypotension resulting from blood pressure medications and a decreased cardiac output. Echo from 08/20/15 showing EF of 25-30%. The ATN then lead to nausea and poor oral intake on the day of admission. Renal function is now improving with gentle hydration - Scr has trended down to 1.85 today, it was 4.71 on admission. Renal ultrasound normal.  -Discontinue cardiac monitoring  -Dexamethasone 4 mg IV given once today.   -If BP drops, give NS 500cc bolus, recheck BP q4 -HOLD home meds Carvedilol 6.25 mg BID and Lisinopril 10 mg qd in the setting of hypotension. However, if her BP continues to improve, may start patient on low dose carvedilol (3.125 mg BID) as per cardiology recommendation.  -Promethazine 25 mg IV q6 prn nausea -Prochlorperazine oral 10 mg q8 prn nausea  -Follow up AM BMP -Follow up AM CBC -Follow up at heart failure clinic as outpatient   History of DVT: INR therapeutic today (2.09)  -Coumadin per pharmacy   -Follow up INR in AM  Metastatic breast cancer Hx of metastatic breast cancer s/p mastectomy, radiation therapy, and chemotherapy. Currently on  Xeloda oral therapy and Aranesp injection.  -HOLD Xeloda in the setting of elevated SCr -HOLD Fentanyl patch in the setting of hypotension -Oxycodone 5 mg q6 prn pain  -Tylenol 650 mg q6 prn pain  -Voltaren gel as needed for pain   Normocytic anemia: Hgb 7.8, MCV normal. Iron low, TIBC low, Saturation low, ferritin high. Findings consistent with anemia of chronic disease in the setting of cancer. Reticulocyte count low, likely secondary to bone marrow suppression from chemotherapy. Hgb 7.8  yesterday, now trended up to 9.0.  -Monitor daily CBC, goal Hgb >7  HTN: HOLD home meds Carvedilol 6.25 mg BID and Lisinopril 10 mg qd in the setting of hypotension  Chemo induced neuropathy -HOLD Neurontin for now   Insomnia -HOLD Zolpidem  Diet: Regular   DVT ppx: Subcutaneous Heparin   Code: FULL  Dispo: Disposition is deferred at this time, awaiting improvement of current medical problems.  Anticipated discharge in approximately 1-2 day(s).   The patient does have a current PCP Everardo Beals, NP) and does need an [facility] hospital follow-up appointment after discharge.  The patient does not have transportation limitations that hinder transportation to clinic appointments.  .Services Needed at time of discharge: Y = Yes, Blank = No PT:   OT:   RN:   Equipment:   Other:     LOS: 2 days   [name], MD 09/04/2015, 2:24 PM

## 2015-09-04 NOTE — Evaluation (Signed)
Occupational Therapy Evaluation and Discharge Summary Patient Details Name: Erika [name] MRN: [id number] DOB: 07/17/1971 Today's Date: 09/04/2015    History of Present Illness 44 yo F with a PMHx of breast cancer (invasive high grade ductal carcinoma) s/p mastectomy and chemotherapy that resulted in cardiomyopathy, DVT, and IDA presenting to [facility] with a chief complaint of vomiting. Patient states she has chronic nausea due to chemotherapy and is currently taking Compazine and Zofran at home. States she vomited 1 week ago, which stopped and then she started vomiting again this morning. Also reports feeling feeling dizzy and having a dry mouth. Denies LOC. She attributes her vomiting to drinking orange fruit punch. States she went to her heart failure clinic this morning and was told to come to the hospital because her "kidney numbers were high."   Clinical Impression   Pt admitted with the above symptoms and overall is at baseline with adls.  Will sign off of OT at this time.  If her status changes, please re order OT.    Follow Up Recommendations  No OT follow up    Equipment Recommendations  None recommended by OT    Recommendations for Other Services       Precautions / Restrictions Precautions Precautions: Fall Restrictions Weight Bearing Restrictions: No      Mobility Bed Mobility Overal bed mobility: Independent             General bed mobility comments: Able to get OOB with no physical assistance or use of bed rails.  Transfers Overall transfer level: Independent Equipment used: None             General transfer comment: Able to stand without assistance. Patient denied dizziness throughout.    Balance Overall balance assessment: No apparent balance deficits (not formally assessed)                                          ADL Overall ADL's : At baseline                                       General ADL  Comments: Pt did well with adls despite cramp in R side.     Vision     Perception     Praxis      Pertinent Vitals/Pain Pain Assessment: 0-10 Pain Score: 5  Pain Location: cramp in R side Pain Descriptors / Indicators: Cramping Pain Intervention(s): Limited activity within patient's tolerance;Patient requesting pain meds-RN notified     Hand Dominance Right   Extremity/Trunk Assessment Upper Extremity Assessment Upper Extremity Assessment: Overall WFL for tasks assessed   Lower Extremity Assessment Lower Extremity Assessment: Overall WFL for tasks assessed   Cervical / Trunk Assessment Cervical / Trunk Assessment: Normal   Communication Communication Communication: No difficulties   Cognition Arousal/Alertness: Awake/alert Behavior During Therapy: WFL for tasks assessed/performed Overall Cognitive Status: Within Functional Limits for tasks assessed                     General Comments       Exercises       Shoulder Instructions      Home Living Family/patient expects to be discharged to:: Private residence Living Arrangements: Children;Spouse/significant other;Other relatives Available Help at Discharge: Family;Available 24 hours/day Type of  Home: House Home Access: Stairs to enter [employer] of Steps: 8 Entrance Stairs-Rails: Can reach both Home Layout: Two level Alternate Level Stairs-Number of Steps: 12 Alternate Level Stairs-Rails: Can reach both Bathroom Shower/Tub: Teacher, early years/pre: Standard     Home Equipment: None   Additional Comments: Patient unable to live on main floor.      Prior Functioning/Environment Level of Independence: Independent             OT Diagnosis:     OT Problem List:     OT Treatment/Interventions:      OT Goals(Current goals can be found in the care plan section) Acute Rehab OT Goals Patient Stated Goal: Return home. OT Goal Formulation: All assessment and education  complete, DC therapy  OT Frequency:     Barriers to D/C:            Co-evaluation              End of Session Nurse Communication: Mobility status;Patient requests pain meds  Activity Tolerance: Patient tolerated treatment well Patient left: in bed;with call bell/phone within reach   Time: 1230-1241 OT Time Calculation (min): 11 min Charges:  OT General Charges $OT Visit: 1 Procedure OT Evaluation $Initial OT Evaluation Tier I: 1 Procedure G-Codes:    Glenford Peers September 27, 2015, 12:44 PM  [phone]

## 2015-09-04 NOTE — Progress Notes (Signed)
[facility] for Coumadin Indication: VTE treatment  Allergies  Allergen Reactions  . Doxycycline Nausea And Vomiting    Patient Measurements: Height: 5' 5.5" (166.4 cm) Weight: 166 lb 6.4 oz (75.479 kg) IBW/kg (Calculated) : 58.15  Vital Signs: Temp: 98.2 F (36.8 C) (09/22 0810) Temp Source: Oral (09/22 0810) BP: 110/74 mmHg (09/22 0810) Pulse Rate: 82 (09/22 0810)  Labs:  Recent Labs  09/02/15 1640 09/03/15 0437 09/03/15 1140 09/03/15 1855 09/04/15 0525  HGB 10.0* 7.8*  --  9.0* 8.7*  HCT 31.2* 25.1*  --  28.6* 28.2*  PLT 426* 389  --  409* 475*  LABPROT 43.7* 49.9*  --   --  23.3*  INR 4.82* 5.75*  --   --  2.09*  CREATININE 4.71* 2.95*  --  1.85*  --   TROPONINI  --   --  <0.03  --   --     Estimated Creatinine Clearance: 39.5 mL/min (by C-G formula based on Cr of 1.85).   Medical History: Past Medical History  Diagnosis Date  . Headache(784.0)     Hx: of Migraines  . Anxiety     ANXIOUS ABOUT HAVEING BIG SURGERY - MASTECTOMY  . 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 DVT (deep venous thrombosis)     RT ARM-PT ON LOVENOX  . S/P radiation therapy 02-04-14 to 03-18-14                               1) Left Chest Wall / 50 Gy in 25 fractions/ 2) Left Supraclavicular fossa/ 46 Gy in 23 fractions / 3) Left Posterior Axillary boost / 8.533 Gy in 23 fractions/ 4) Left Chest Wall Scar boost / 10 Gy in 5 fractions  . CHF (congestive heart failure)   . Iron deficiency anemia 11/26/13  . Cancer of left breast     Invasive High Grade Carcinoma; S/P mastectomy, Adriamycin and Cytoxan, radiation  . Metastases to the liver   . Metastasis to lung    Assessment: 44 yo F presents on 9/20 with hypotension. Pt also has a hx of RUE DVT and is on coumadin. Patient takes Coumadin 1.25mg  daily exc 2.5mg  on MWF. Last dose was on 9/19. Pharmacy consulted to continue coumadin. INR on admit was supratherapeutic at 4.82, peak 5.75. Hgb stable  8.7 plts up to 475. No s/s of bleed. S/p vit k 5mg  po on 9/21  INR now back to therapeutic range 2.09 today. Pharmacy re-consulted to dose warfarin.    Goal of Therapy:  INR 2-3 Monitor platelets by anticoagulation protocol: Yes   Plan:  Coumadin 1mg  x 1 dose tonight Monitor daily INR, CBC, s/s of bleed  Elicia Lamp, PharmD Clinical Pharmacist Pager [phone] 09/04/2015 10:38 AM

## 2015-09-04 NOTE — Progress Notes (Signed)
Patient ID: Erika [name], female   DOB: May 09, 1971, 44 y.o.   MRN: [id number]  HPI: Ms Erika [name] is a 44 year old with history of metastatic left breast cancer, ER 14%, PR negative, HER-2/neu negative , triple negative with a Ki-67 that approaches 100%, DVT RUE, iron deficiency anemia, S/P radiation chest wall 02/04/14 through 03/18/2014 referred to [facility] by Dr Erika [name] due to reduced EF.    -Received 4 cycles doxorubicin and cyclophosphamide in dose dense fashion x4, followed by carboplatin and paclitaxel x1, discontinued due to neuropathy, followed by carboplatin and gemcitabine, both given day 1 of each 14 day cycle x6, completed 10/12/2013 . -started cyclophosphamide, methotrexate, fluorouracil (CMF) 11/19/2014, repeated every 21 days.               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 Antithrombin level was normal. - to start liposomal doxorubicin (Doxil) 05/14/2015, canceled because of low ejection fraction - started capecitabine1.5g BID, 05/20/15, 1 week on, 1 week off.  ECHO 08/20/2015 EF 20-25%  ECHO 05/21/2015  With contrast EF 25-25%  ECHO 05/16/2015-EF 25-30% RV normal Lat s' 8.3 GLS -14% ECHO 05/2013 EF 55%  Lat  s' 9.0 No GLS   She was admitted with nausea/vomiting and AKI.  She has been rehydrated and Coreg/lisinopril were held.   Past Medical History  Diagnosis Date  . Headache(784.0)     Hx: of Migraines  . Anxiety     ANXIOUS ABOUT HAVEING BIG SURGERY - MASTECTOMY  . 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 DVT (deep venous thrombosis)     RT ARM-PT ON LOVENOX  . S/P radiation therapy 02-04-14 to 03-18-14                               1) Left Chest  Wall / 50 Gy in 25 fractions/ 2) Left Supraclavicular fossa/ 46 Gy in 23 fractions / 3) Left Posterior Axillary boost / 8.533 Gy in 23 fractions/ 4) Left Chest Wall Scar boost / 10 Gy in 5 fractions  . CHF (congestive heart failure)   . Iron deficiency anemia 11/26/13  . Cancer of left breast     Invasive High Grade Carcinoma; S/P mastectomy, Adriamycin and Cytoxan, radiation  . Metastases to the liver   . Metastasis to lung     Current Facility-Administered Medications  Medication Dose Route Frequency [name] Last Rate Last Dose  . acetaminophen (TYLENOL) tablet 650 mg  650 mg Oral Q6H PRN [name], MD   650 mg at 09/03/15 2019   Or  . acetaminophen (TYLENOL) suppository 650 mg  650 mg Rectal Q6H PRN [name], MD      . diclofenac sodium (VOLTAREN) 1 % transdermal gel 4 g  4 g Topical PRN [name], MD   4 g at 09/03/15 2229  . loratadine (CLARITIN) tablet 10 mg  10 mg Oral Daily PRN [name], MD      . prochlorperazine (COMPAZINE) tablet 10 mg  10 mg Oral Q8H PRN [name], MD      . promethazine (PHENERGAN) injection 25 mg  25 mg Intravenous Q6H PRN [name], MD      .  senna-docusate (Senokot-S) tablet 1 tablet  1 tablet Oral QHS PRN [name], MD      . senna-docusate (Senokot-S) tablet 1 tablet  1 tablet Oral Daily [name], MD   1 tablet at 09/04/15 1045  . warfarin (COUMADIN) tablet 1 mg  1 mg Oral ONCE-1800 [name], [name]      . Warfarin - Pharmacist Dosing Inpatient   Does not apply [name], [name]       Facility-Administered Medications Ordered in Other Encounters  Medication Dose Route Frequency [name] Last Rate Last Dose  . Darbepoetin Alfa (ARANESP) injection 200 mcg  200 mcg Subcutaneous Once [name], NP      . sodium chloride 0.9 % injection 10 mL  10 mL Intravenous PRN [name], MD   10 mL at 06/19/15 1330  . topical emolient (BIAFINE) emulsion   Topical Daily [name], MD         Allergies  Allergen Reactions  . Doxycycline Nausea And Vomiting      Social History   Social History  . Marital Status: Single    Spouse Name: N/A  . Number of Children: 3  . Years of Education: N/A   Occupational History  .     Social History Main Topics  . Smoking status: Never Smoker   . Smokeless tobacco: Never Used  . Alcohol Use: No  . Drug Use: No  . Sexual Activity: Not Currently   Other Topics Concern  . Not on file   Social History Narrative      Family History  Problem Relation Age of Onset  . Hypertension Mother   . Diabetes Father 80  . Stomach cancer Maternal Aunt   . Hypertension Maternal Grandmother   . Hypertension Maternal Grandfather   . Diabetes Paternal Grandmother   . Diabetes Paternal Grandfather   . Diabetes Paternal Uncle   . Diabetes Paternal Uncle   . Diabetes Paternal Uncle     Filed Vitals:   09/03/15 2134 09/04/15 0017 09/04/15 0700 09/04/15 0810  BP: 86/58 97/64 105/73 110/74  Pulse:  90 91 82  Temp:  97.6 F (36.4 C) 98.1 F (36.7 C) 98.2 F (36.8 C)  TempSrc:  Oral Oral Oral  Resp:  20 16 16   Height:      Weight:   166 lb 6.4 oz (75.479 kg)   SpO2:  97% 100% 100%    PHYSICAL EXAM: General:  NAD HEENT: normal Neck: supple. no JVD. Carotids 2+ bilat; no bruits. No lymphadenopathy or thryomegaly appreciated. Cor: PMI nondisplaced. Regular rate & rhythm. No rubs, gallops or murmurs. R upper chest porta cath.  Lungs: clear Abdomen: soft, nontender, nondistended. No hepatosplenomegaly. No bruits or masses. Good bowel sounds. Extremities: no cyanosis, clubbing, rash, edema Neuro: alert & oriented x 3, cranial nerves grossly intact. moves all 4 extremities w/o difficulty. Affect pleasant.  ASSESSMENT & PLAN:  1. Chronic Systolic Heart Failure. Likely related to prior doxorubicin. Has not had ischemic work up. Newly reduced EF from 55% in 2014 to 25-30% in June 2016 and again on echo this month. She is not volume  overloaded.  - Coreg and lisinopril on hold with hypotension/AKI.  With hydration, BP better and creatinine down to 1.2.  If BP continues to improve, think she could eventually be started back on low dose Coreg 3.125 mg bid.  Would leave off ACEI for the time being with AKI.  2. Metastatic breast cancer: Per Dr  Magrinat, currently getting Xeloda.  3. RUE DVT: on coumadin.  4. Nausea/vomiting: Present for several weeks, worse over the couple of days prior to admission.  ?Related to Xeloda.  Doubt related to AKI as seems to pre-date AKI and BUN never that high.   5. Hypotension: Suspect she was dehydrated with nausea/vomiting/poor po intake and also had been taking Coreg and lisinopril. BP better now, off Coreg and lisinopril.  Creatinine to 1.2 with hydration.  Would consider restarting low dose Coreg if BP continues to improve.  6. AKI: Prerenal with dehydration.  It has corrected.   We will follow from a distance at this point, call with questions.  When she goes home, will need followup in CHF clinic.   Loralie Champagne 09/04/2015

## 2015-09-04 NOTE — Progress Notes (Signed)
Per lab tech [place] lab would be drawn on day shift per day shift lab tech for consistency of draw.  Writing RN passed on information to day shift RN.

## 2015-09-04 NOTE — Evaluation (Signed)
Physical Therapy Evaluation and Discharge Patient Details Name: Haelyn Forgey MRN: [id number] DOB: 07-14-1971 Today's Date: 09/04/2015   History of Present Illness  44 yo F with a PMHx of breast cancer (invasive high grade ductal carcinoma) s/p mastectomy and chemotherapy that resulted in cardiomyopathy, DVT, and IDA presenting to [facility] with a chief complaint of vomiting. Patient states she has chronic nausea due to chemotherapy and is currently taking Compazine and Zofran at home. States she vomited 1 week ago, which stopped and then she started vomiting again this morning. Also reports feeling feeling dizzy and having a dry mouth. Denies LOC. She attributes her vomiting to drinking orange fruit punch. States she went to her heart failure clinic this morning and was told to come to the hospital because her "kidney numbers were high."  Clinical Impression  Patient in bed, agreeable to participate in PT today. Patient was able to ambulate and navigate stairs as described below. Patient denied dizziness or nausea throughout. She is modified independent at least with all activity. All education is complete and patient has no PT needs at this time. Patient agreeable to maintaining mobility independently while in the hospital. Please reconsult if status changes. Thanks,     Follow Up Recommendations No PT follow up    Equipment Recommendations  None recommended by PT    Recommendations for Other Services       Precautions / Restrictions Precautions Precautions: Fall Restrictions Weight Bearing Restrictions: No      Mobility  Bed Mobility Overal bed mobility: Independent             General bed mobility comments: Able to get OOB with no physical assistance or use of bed rails.  Transfers Overall transfer level: Independent Equipment used: None             General transfer comment: Able to stand without assistance. Patient denied dizziness  throughout.  Ambulation/Gait Ambulation/Gait assistance: Independent Ambulation Distance (Feet): 280 Feet Assistive device: None Gait Pattern/deviations: WFL(Within Functional Limits);Step-through pattern   Gait velocity interpretation: at or above normal speed for age/gender General Gait Details: Patient was steady on feet throughout, no LOB noted. Patient was able to pace herself to maintain energy. Was able to walk and talk throughout as well.  Stairs Stairs: Yes Stairs assistance: Modified independent (Device/Increase time) Stair Management: One rail Right;Step to pattern;Forwards Number of Stairs: 24 General stair comments: Patient was safe with stair navigation. Required use of rails and step-to pattern, which she reports is her baseline to conserve energy, which she has learned to do from [facility] of breast cancer. Felt tired after stairs, took appropriate rest breaks without cues and was overall very safe.  Wheelchair Mobility    Modified Rankin (Stroke Patients Only)       Balance Overall balance assessment: No apparent balance deficits (not formally assessed)                                           Pertinent Vitals/Pain Pain Assessment: No/denies pain    Home Living Family/patient expects to be discharged to:: Private residence Living Arrangements: Children;Spouse/significant other;Other relatives (Fiance, 2 children, 2 grandchildren and son-in-law.) Available Help at Discharge: Family;Available 24 hours/day Type of Home: House Home Access: Stairs to enter Entrance Stairs-Rails: Can reach both Entrance Stairs-Number of Steps: 8 Home Layout: Two level Home Equipment: None Additional Comments: Patient unable to  live on main floor.    Prior Function Level of Independence: Independent               Hand Dominance        Extremity/Trunk Assessment   Upper Extremity Assessment: Overall WFL for tasks assessed           Lower  Extremity Assessment: Overall WFL for tasks assessed      Cervical / Trunk Assessment: Normal  Communication   Communication: No difficulties  Cognition Arousal/Alertness: Awake/alert Behavior During Therapy: WFL for tasks assessed/performed Overall Cognitive Status: Within Functional Limits for tasks assessed                      General Comments      Exercises        Assessment/Plan    PT Assessment Patent does not need any further PT services  PT Diagnosis     PT Problem List    PT Treatment Interventions     PT Goals (Current goals can be found in the Care Plan section) Acute Rehab PT Goals Patient Stated Goal: Return home. PT Goal Formulation: With patient Time For Goal Achievement: 09/11/15 Potential to Achieve Goals: Good    Frequency     Barriers to discharge        Co-evaluation               End of Session   Activity Tolerance: Patient tolerated treatment well Patient left: in bed;with call bell/phone within reach Nurse Communication: Mobility status         Time: 1100-1119 PT Time Calculation (min) (ACUTE ONLY): 19 min   Charges:   PT Evaluation $Initial PT Evaluation Tier I: 1 Procedure     PT G CodesRoanna Epley, SPT [phone] 09/04/2015, 11:55 AM  I have read, reviewed and agree with student's note.   [place] [phone] (pager)

## 2015-09-05 ENCOUNTER — Encounter: Payer: Self-pay | Admitting: Radiation Therapy

## 2015-09-05 ENCOUNTER — Other Ambulatory Visit: Payer: Self-pay | Admitting: Radiation Therapy

## 2015-09-05 ENCOUNTER — Other Ambulatory Visit: Payer: Self-pay | Admitting: Oncology

## 2015-09-05 DIAGNOSIS — C7931 Secondary malignant neoplasm of brain: Secondary | ICD-10-CM

## 2015-09-05 LAB — CBC
HCT: 26.9 % — ABNORMAL LOW (ref 36.0–46.0)
Hemoglobin: 8.4 g/dL — ABNORMAL LOW (ref 12.0–15.0)
MCH: 27.1 pg (ref 26.0–34.0)
MCHC: 31.2 g/dL (ref 30.0–36.0)
MCV: 86.8 fL (ref 78.0–100.0)
Platelets: 520 K/uL — ABNORMAL HIGH (ref 150–400)
RBC: 3.1 MIL/uL — ABNORMAL LOW (ref 3.87–5.11)
RDW: 22.1 % — ABNORMAL HIGH (ref 11.5–15.5)
WBC: 17.7 K/uL — ABNORMAL HIGH (ref 4.0–10.5)

## 2015-09-05 LAB — BASIC METABOLIC PANEL WITH GFR
Anion gap: 7 (ref 5–15)
BUN: 25 mg/dL — ABNORMAL HIGH (ref 6–20)
CO2: 24 mmol/L (ref 22–32)
Calcium: 9.1 mg/dL (ref 8.9–10.3)
Chloride: 104 mmol/L (ref 101–111)
Creatinine, Ser: 1.29 mg/dL — ABNORMAL HIGH (ref 0.44–1.00)
GFR calc Af Amer: 57 mL/min — ABNORMAL LOW
GFR calc non Af Amer: 49 mL/min — ABNORMAL LOW
Glucose, Bld: 93 mg/dL (ref 65–99)
Potassium: 4.7 mmol/L (ref 3.5–5.1)
Sodium: 135 mmol/L (ref 135–145)

## 2015-09-05 LAB — PROTIME-INR
INR: 2.14 — ABNORMAL HIGH (ref 0.00–1.49)
Prothrombin Time: 23.8 s — ABNORMAL HIGH (ref 11.6–15.2)

## 2015-09-05 MED ORDER — WARFARIN SODIUM 2.5 MG PO TABS: 1.2500 mg | ORAL_TABLET | Freq: Once | ORAL | Status: DC

## 2015-09-05 MED ORDER — CARVEDILOL 3.125 MG PO TABS: 3.1250 mg | ORAL_TABLET | Freq: Two times a day (BID) | ORAL | Status: DC

## 2015-09-05 MED ORDER — DEXAMETHASONE 4 MG PO TABS: 4.0000 mg | ORAL_TABLET | Freq: Two times a day (BID) | ORAL | Status: DC

## 2015-09-05 NOTE — Discharge Summary (Signed)
Name: Erika [name] MRN: [id number] DOB: 12-Feb-1971 44 y.o. PCP: Everardo Beals, NP  Date of Admission: 09/02/2015  4:12 PM Date of Discharge: 09/05/2015 Attending Physician: Axel Filler, MD  Discharge Diagnosis:  Active Problems:   Anemia in neoplastic disease   Long term current use of anticoagulant therapy   Hypotension   Chronic systolic CHF (congestive heart failure)   AKI (acute kidney injury)  Discharge Medications:   Medication List    STOP taking these medications        lisinopril 10 MG tablet  Commonly known as:  PRINIVIL      TAKE these medications        capecitabine 500 MG tablet  Commonly known as:  XELODA  Take 3 tablets (1,500 mg total) by mouth 2 (two) times daily after a meal.     carvedilol 3.125 MG tablet  Commonly known as:  COREG  Take 1 tablet (3.125 mg total) by mouth 2 (two) times daily with a meal.     cyclobenzaprine 5 MG tablet  Commonly known as:  FLEXERIL  TAKE 1 TABLET BY MOUTH THREE TIMES A DAY FOR MUSCLE SPASMS     dexamethasone 4 MG tablet  Commonly known as:  DECADRON  Take 1 tablet (4 mg total) by mouth 2 (two) times daily.     fentaNYL 25 MCG/HR patch  Commonly known as:  DURAGESIC - dosed mcg/hr  Place 1 patch (25 mcg total) onto the skin every 3 (three) days.     fentaNYL 75 MCG/HR  Commonly known as:  DURAGESIC - dosed mcg/hr  Place 1 patch (75 mcg total) onto the skin every 3 (three) days.     gabapentin 300 MG capsule  Commonly known as:  NEURONTIN  Take 1 capsule (300 mg total) by mouth 3 (three) times daily.     loratadine 10 MG tablet  Commonly known as:  CLARITIN  Take 10 mg by mouth daily as needed for allergies.     LORazepam 0.5 MG tablet  Commonly known as:  ATIVAN  Take 1 tablet (0.5 mg total) by mouth every 6 (six) hours as needed for anxiety.     ondansetron 8 MG tablet  Commonly known as:  ZOFRAN  Take 1 tablet (8 mg total) by mouth 2 (two) times daily.     oxyCODONE 5 MG  immediate release tablet  Commonly known as:  Oxy IR/ROXICODONE  1-2 tablets every 8 hrs as needed for severe or breakthrough pain     potassium chloride SA 20 MEQ tablet  Commonly known as:  K-DUR,KLOR-CON  Take 1 tablet (20 mEq total) by mouth 4 (four) times daily.     prochlorperazine 10 MG tablet  Commonly known as:  COMPAZINE  Take 1 tablet (10 mg total) by mouth every 8 (eight) hours as needed for nausea or vomiting.     warfarin 5 MG tablet  Commonly known as:  COUMADIN  Take 1.25-2.5 mg by mouth daily at 6 PM. Patient is getting regulated on Coumadin. Pt takes 2.5 mg M,W,F, all other days 1.25 mg (1/2 tablet).     zolpidem 5 MG tablet  Commonly known as:  AMBIEN  Take 1 tablet (5 mg total) by mouth at bedtime as needed for sleep.        Disposition and follow-up:   Ms.Erika [name] was discharged from [facility] in Good condition.  At the hospital follow up visit please address:  1.  MRI on 09/04/15  showing a 7 mm structure with vasogenic edema in the R occipital lobe, highly suspicious for metastatic diseae. Dr. Jana Hakim (oncology) recommended starting patient on Dexamethasone 4 mg BID x 4 days on day of discharge. She has an appointment with him on 09/09/15. He also informed me that his office will be calling the patient with a date for brain MRI with contrast.   We stopped patient's Lisinopril in the setting of hypotension and elevated Scr. She is currently on Carvedilol 3.125 mg BID as per cardiology recommendation.   2.  Labs / imaging needed at time of follow-up:  3.  Pending labs/ test needing follow-up:  Follow-up Appointments:     Follow-up Information    Follow up with [name], MD. Go on 09/09/2015.   Specialty:  Oncology   Why:  Follow up appointment at 10 am.    Contact information:   [place] [address] [phone]       Follow up with Loralie Champagne, MD. Go on 09/12/2015.   Specialty:  Cardiology    Why:  Follow up appointment at 10:20 am.    Contact information:   [place] [address] [phone]       Discharge Instructions:   -STOP taking Lisinopril  -Take Carvedilol 3.125 mg: 1 tablet by mouth twice daily (New Dose)  -Take Dexamethasone 4 mg: 1 tablet by mouth twice daily. Take medication for a total of 4 days.   -Please go to your follow up appointment with Dr. Jana Hakim on 09/09/15 at 10 am. His office will call you with a date for brain MRI with contrast.   -Please follow up with your heart failure clinic (Dr. Aundra Dubin) on 09/12/15 at 10:20 am.   Consultations: Treatment Team:  Rounding Lbcardiology, MD  Procedures Performed:  Dg Chest 2 View  09/02/2015   CLINICAL DATA:  Hypotensive at her Dr's office today. Pt has hx of left side breast CA with left side mastectomy. Radiation therapy ending in October 2015. Hx of DVT. Nonsmoker.  EXAM: CHEST - 2 VIEW  COMPARISON:  CT 05/01/2015  FINDINGS: Left lung clear. Surgical clips in the left axilla. Heart size normal. Right subclavian port to the SVC RA junction.  Multiple peripheral right lung nodules. Right pleural thickening or loculated effusion. Significantly decreased right lung volume compared to left. No pneumothorax. Visualized skeletal structures are unremarkable.  IMPRESSION: 1. Multiple right lung nodules and extensive pleural disease as before. No acute/superimposed abnormality.   Electronically Signed   By: Lucrezia Europe M.D.   On: 09/02/2015 19:00   Dg Abd 1 View  08/15/2015   CLINICAL DATA:  Abdominal pain and vomiting today.  EXAM: ABDOMEN - 1 VIEW  COMPARISON:  05/01/2015 CT  FINDINGS: Moderate stool in the proximal colon is again noted.  There is no evidence of bowel obstruction, there is no evidence of bowel obstruction or pneumoperitoneum.  No suspicious calcifications are identified.  No acute bony abnormalities are noted.  IMPRESSION: No evidence of acute abnormality.  Moderate stool in  the proximal colon again noted.   Electronically Signed   By: Margarette Canada M.D.   On: 08/15/2015 17:25   Mr Brain Wo Contrast  09/04/2015   CLINICAL DATA:  44 year old female with breast cancer treated with mastectomy and chemotherapy presenting with headache for the past 2 weeks worse over the past few days. Dizziness. Anemia. Subsequent encounter.  EXAM: MRI HEAD WITHOUT CONTRAST  TECHNIQUE: Multiplanar, multiecho pulse  sequences of the brain and surrounding structures were obtained without intravenous contrast.  COMPARISON:  05/23/2015 MR.  FINDINGS: Exam is motion degraded.  New from the prior examination is a 7 mm rounded structure with surrounding vasogenic edema within the right occipital lobe highly suspicious for metastatic disease. Contrast was not administered.  Change in appearance of bone marrow of the clivus and upper cervical spine may represent interval treatment of patient's known underlying anemia.  No acute infarct.  No intracranial hemorrhage.  Major intracranial vascular structures are patent.  Partially empty non expanded sella without change.  Minimal exophthalmos.  Polypoid opacification inferior right maxillary sinus.  C4-5 bulge greater to left with mild cord flattening incompletely assessed on the present exam.  IMPRESSION: Exam is motion degraded.  New from the prior examination is a 7 mm rounded structure with surrounding vasogenic edema within the right occipital lobe highly suspicious for metastatic disease. Contrast was not administered.  Change in appearance of bone marrow of the clivus and upper cervical spine may represent interval treatment of patient's known underlying anemia.  No acute infarct.  No intracranial hemorrhage.  C4-5 bulge greater to left with mild cord flattening incompletely assessed on the present exam.  These results will be called to the ordering clinician or representative by the Radiologist Assistant, and communication documented in the PACS or zVision  Dashboard.   Electronically Signed   By: Genia Del M.D.   On: 09/04/2015 17:53   US Renal  09/03/2015   CLINICAL DATA:  Hypertension. Acute kidney injury. Metastatic breast cancer  EXAM: RENAL / URINARY TRACT ULTRASOUND COMPLETE  COMPARISON:  Multiple exams, including 07/10/2015  FINDINGS: Right Kidney:  Length: 10.3 cm. Echogenicity within normal limits. No mass or hydronephrosis visualized.  Left Kidney:  Length: 11.3 cm. Echogenicity within normal limits. No mass or hydronephrosis visualized.  Bladder:  Appears normal for degree of bladder distention.  IMPRESSION: 1. Normal sonographic appearance of both kidneys. No abnormal echogenicity or hydronephrosis.   Electronically Signed   By: Van Clines M.D.   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 Patient states she has chronic nausea due to chemotherapy and is currently taking Compazine and Zofran at home. States she vomited 1 week ago, which stopped and then she started vomiting again this morning. Also reports feeling feeling dizzy and having a dry mouth. Denies LOC. She attributes her vomiting to drinking orange fruit punch. States she went to her heart failure clinic this morning and was told to come to the hospital because her "kidney numbers were high."Patient was accompanied by her fiance who stated patient has a decreased appetite for the past 2 weeks and was short of breath at rest yesterday. Denies any dysuria, frequency, urgency, or hematuria. Denies any chest pain. Denies shortness of breath at present.   Hospital Course by problem list: Active Problems:   Anemia in neoplastic disease   Long term current use of anticoagulant therapy   Hypotension   Chronic systolic CHF (congestive heart failure)    AKI (acute kidney injury)   Hypotension and Acute Kidney Injury BP 66/49 on admission. Patient had been vomiting since the morning of admission. History of decreased po intake for the past 2 weeks. As per epic records, she was started on  Lisinopril and Coreg on 06/05/15 by Oncology. Patient's SCr was elevated (4.71) on admission, it was normal on 08/12/15. Creatinine had been rising since early this month - 1.9 on 08/15/15 and 2.1 on 08/26/15. She also had leukocytosis (WBC 12.9), however, no infection as per UA and patient was afebrile. CBC also showing thrombocytosis (platelets 426). Hgb 10.6 on admission, was 8.5 on 08/26/15. Findings consistent with hemoconcentration in the setting of dehydration. CXR showing multiple R lung nodules and extensive pleural disease, no acute/ superimposed abnormality. EKG showing sinus tachycardia and non-specific T wave abnormalities. Troponin negative.  Pt likely had refractory hypotension because she was incompletely fluid responsive. 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 ACTH stimulation test showing a 60 minute cortisol value of 19.5. Acute renal failure most likely due to ATN, her FENa was 2.9%. Her acute kidney injury was likely due to hypotension resulting from blood pressure medications and a decreased cardiac output. Echo from 08/20/15 showing EF of 25-30%. The ATN then lead to nausea and poor oral intake on the day of admission. Renal function improved - Scr trended down to 1.29, it was 4.71 on admission. Renal ultrasound normal. She had leukocytosis on day of discharge, likely 2/2 steroid use. Vital signs stable. On the day of discharge, pt started on low dose carvedilol (3.125 mg BID) as per cardiology recommendation. Patient has a follow up appointment with Dr. Aundra Dubin (HF clinic) on 09/12/15.   Metastatic breast cancer Hx of  metastatic breast cancer s/p mastectomy, radiation therapy, and chemotherapy. Currently on Xeloda oral therapy and Aranesp injection. We held narcotic pain medications in the setting of hypotension. Xeloda held in the setting of elevated SCr. Pt instructed to resume medication after discharge. MRI on 09/04/15 showing a 7 mm structure with vasogenic edema in the R occipital lobe, highly suspicious for metastatic diseae. Dr. Jana Hakim (oncology) recommended starting patient on Dexamethasone 4 mg BID x 4 days on day of discharge. She has an appointment with him on 09/09/15. He also informed me that his office will be calling the patient with a date for brain MRI with contrast.   Normocytic anemia: Hgb 7.8 on admission, MCV normal. Iron low, TIBC low, Saturation low, ferritin high. Findings consistent with anemia of chronic disease in the setting of cancer. Reticulocyte count low, likely secondary to bone marrow suppression from chemotherapy. Hgb 8.4 on day of discharge which is close to patient's baseline.   HTN: Held home med Lisinopril 10 mg qd in the setting of hypotension and elevated creatinine on admission. On the day of discharge, pt started on Carvedilol 3.125 mg BID as per cardiology recommendation.   History of DVT: given Coumadin per pharmacy dosing.    Discharge Vitals:   BP 101/71 mmHg  Pulse 82  Temp(Src) 98.1 F (36.7 C) (Oral)  Resp 17  Ht 5' 5.5" (1.664 m)  Wt 163 lb 9.6 oz (74.208 kg)  BMI 26.80 kg/m2  SpO2 100%  LMP 05/30/2013  Discharge Labs:  Results for orders placed or performed during the hospital encounter of 09/02/15 (from the past 24 hour(s))  Protime-INR     Status: Abnormal   Collection Time: 09/05/15  5:18 AM  Result Value Ref Range   Prothrombin Time 23.8 (H) 11.6 - 15.2 seconds   INR 2.14 (H) 0.00 - 1.49  CBC     Status: Abnormal   Collection Time: 09/05/15  5:18 AM  Result Value Ref Range   WBC 17.7 (H) 4.0 - 10.5 K/uL   RBC 3.10 (L) 3.87 - 5.11 MIL/uL    Hemoglobin 8.4 (L) 12.0 - 15.0 g/dL   HCT 26.9 (L) 36.0 - 46.0 %   MCV 86.8 78.0 - 100.0 fL   MCH 27.1 26.0 - 34.0 pg   MCHC 31.2 30.0 - 36.0 g/dL   RDW 22.1 (H) 11.5 - 15.5 %   Platelets 520 (H) 150 - 400 K/uL  Basic metabolic panel     Status: Abnormal   Collection Time: 09/05/15  5:18 AM  Result Value Ref Range   Sodium 135 135 - 145 mmol/L   Potassium 4.7 3.5 - 5.1 mmol/L   Chloride 104 101 - 111 mmol/L   CO2 24 22 - 32 mmol/L   Glucose, Bld 93 65 - 99 mg/dL   BUN 25 (H) 6 - 20 mg/dL   Creatinine, Ser 1.29 (H) 0.44 - 1.00 mg/dL   Calcium 9.1 8.9 - 10.3 mg/dL   GFR calc non Af Amer 49 (L) >60 mL/min   GFR calc Af Amer 57 (L) >60 mL/min   Anion gap 7 5 - 15    Signed: [name], MD 09/05/2015, 12:48 PM    Services Ordered on Discharge: None Equipment Ordered on Discharge: None

## 2015-09-05 NOTE — Discharge Instructions (Signed)
-  STOP taking Lisinopril  -Take Carvedilol 3.125 mg: 1 tablet by mouth twice daily (New Dose)  -Take Dexamethasone 4 mg: 1 tablet by mouth twice daily. Take medication for a total of 4 days.   -Please go to your follow up appointment with Dr. Jana Hakim on 09/09/15 at 10 am. His office will call you with a date for brain MRI with contrast.   -Please follow up with your heart failure clinic (Dr. Aundra Dubin) on 09/12/15 at 10:20 am.

## 2015-09-05 NOTE — Progress Notes (Signed)
[place] for Coumadin Indication: VTE treatment  Allergies  Allergen Reactions  . Doxycycline Nausea And Vomiting    Patient Measurements: Height: 5' 5.5" (166.4 cm) Weight: 163 lb 9.6 oz (74.208 kg) IBW/kg (Calculated) : 58.15  Vital Signs: Temp: 98.1 F (36.7 C) (09/23 1112) Temp Source: Oral (09/23 1112) BP: 101/71 mmHg (09/23 1112) Pulse Rate: 82 (09/23 1112)  Labs:  Recent Labs  09/03/15 0437 09/03/15 1140 09/03/15 1855 09/04/15 0525 09/04/15 1040 09/05/15 0518  HGB 7.8*  --  9.0* 8.7*  --  8.4*  HCT 25.1*  --  28.6* 28.2*  --  26.9*  PLT 389  --  409* 475*  --  520*  LABPROT 49.9*  --   --  23.3*  --  23.8*  INR 5.75*  --   --  2.09*  --  2.14*  CREATININE 2.95*  --  1.85* 1.28* 1.30* 1.29*  TROPONINI  --  <0.03  --   --   --   --     Estimated Creatinine Clearance: 56.2 mL/min (by C-G formula based on Cr of 1.29).   Medical History: Past Medical History  Diagnosis Date  . Headache(784.0)     Hx: of Migraines  . Anxiety     ANXIOUS ABOUT HAVEING BIG SURGERY - MASTECTOMY  . 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 DVT (deep venous thrombosis)     RT ARM-PT ON LOVENOX  . S/P radiation therapy 02-04-14 to 03-18-14                               1) Left Chest Wall / 50 Gy in 25 fractions/ 2) Left Supraclavicular fossa/ 46 Gy in 23 fractions / 3) Left Posterior Axillary boost / 8.533 Gy in 23 fractions/ 4) Left Chest Wall Scar boost / 10 Gy in 5 fractions  . CHF (congestive heart failure)   . Iron deficiency anemia 11/26/13  . Cancer of left breast     Invasive High Grade Carcinoma; S/P mastectomy, Adriamycin and Cytoxan, radiation  . Metastases to the liver   . Metastasis to lung    Assessment: 44 yo F presents on 9/20 with hypotension. Pt also has a hx of RUE DVT and is on coumadin. Patient takes Coumadin 1.25mg  daily exc 2.5mg  on MWF. Last dose PTA was on 9/19. Pharmacy consulted to continue coumadin. INR on admit  was supratherapeutic at 4.82, peak 5.75. Hgb stable 8.7 plts up to 475. No s/s of bleed. S/p vit k 5mg  po on 9/21. INR 2.14 today.   Goal of Therapy:  INR 2-3 Monitor platelets by anticoagulation protocol: Yes    Plan:  Coumadin 1.25 mg x 1  -- attempt to get patient therapeutic with tablets she already has Monitor daily INR, s/sx of bleed   Hughes Better, PharmD, BCPS Clinical Pharmacist Pager: [phone] 09/05/2015 12:01 PM

## 2015-09-05 NOTE — Progress Notes (Addendum)
Subjective: Patient seen and examined at bedside this morning. She is able to tolerate oral intake and is not complaining of any nausea. Denies any having any headaches, dizziness, chest pain, SOB, or abdominal pain. No other complaints.  Objective: Vital signs in last 24 hours: Filed Vitals:   09/05/15 0002 09/05/15 0405 09/05/15 0411 09/05/15 0713  BP: 103/70 105/69  101/74  Pulse: 89 80  77  Temp: 97.7 F (36.5 C) 97.9 F (36.6 C)  97.7 F (36.5 C)  TempSrc: Oral Oral  Oral  Resp: 20 22  19   Height:      Weight:   163 lb 9.6 oz (74.208 kg)   SpO2: 98% 99%  99%   Weight change: -2 lb 12.8 oz (-1.27 kg)  Intake/Output Summary (Last 24 hours) at 09/05/15 7510 Last data filed at 09/05/15 0409  Gross per 24 hour  Intake    480 ml  Output    400 ml  Net     80 ml   Physical Exam  Cardiovascular: Normal rate, regular rhythm and intact distal pulses.  Exam reveals no gallop and no friction rub.   No murmur heard. Pulmonary/Chest: Effort normal and breath sounds normal. No respiratory distress. She has no wheezes. She has no rales.  Abdominal: Soft. Bowel sounds are normal. She exhibits no distension. There is no tenderness.  Musculoskeletal: She exhibits no edema.  Neurological:  CN II-XII grossly intact  Sensation intact in bilateral upper and lower extremities. Strength 5/5 in bilateral upper and lower extremities.   Skin: Skin is warm and dry.   Assessment/Plan: Active Problems:   Anemia in neoplastic disease   Long term current use of anticoagulant therapy   Hypotension   Chronic systolic CHF (congestive heart failure)   AKI (acute kidney injury)  Hypotension and Acute Kidney Injury Likely refractory hypotension because pt was incompletely fluid responsive. 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 ACTH stimulation test yesterday  showing a 60 minute cortisol value of 19.5. Acute renal failure most likely due to ATN, her FENa was 2.9%. Her acute kidney injury was likely due to hypotension resulting from blood pressure medications and a decreased cardiac output. Echo from 08/20/15 showing EF of 25-30%. The ATN then lead to nausea and poor oral intake on the day of admission. Renal function is now improving - Scr has trended down to 1.29 today, it was 4.71 on admission. Renal ultrasound normal. WBC 17.7 today, likely 2/2 steroid use.  -BP is continuing to improve, pt is stable. She will be discharged today with low dose carvedilol (3.125 mg BID) as per cardiology recommendation.  -If BP drops, give NS 500cc bolus, recheck BP q4 -Promethazine 25 mg IV q6 prn nausea -Prochlorperazine oral 10 mg q8 prn nausea  -Patient has a follow up appointment with Dr. Aundra Dubin (HF clinic) on 09/12/15.   History of DVT: INR therapeutic today (2.14)  -Coumadin per pharmacy    Metastatic breast cancer Hx of metastatic breast cancer s/p mastectomy, radiation therapy, and chemotherapy. Currently on Xeloda oral therapy and Aranesp injection. MRI on 09/04/15 showing a 7 mm structure with vasogenic edema in the R occipital lobe, highly suspicious for metastatic diseae.  -Pt is to resume Xeloda upon discharge.  -Dr. Jana Hakim (oncology) recommended starting patient on Dexamethasone 4 mg BID x 4 days. She has an appointment with him on 09/09/15. He also informed me that  his office will be calling the patient with a date for brain MRI with contrast.  -HOLD Fentanyl patch for now. Pt is to resume medication upon discharge.  -Oxycodone 5 mg q6 prn pain  -Tylenol 650 mg q6 prn pain  -Voltaren gel as needed for pain   Normocytic anemia: Hgb 7.8, MCV normal. Iron low, TIBC low, Saturation low, ferritin high. Findings consistent with anemia of chronic disease in the setting of cancer. Reticulocyte count low, likely secondary to bone marrow suppression from  chemotherapy. Hgb 8.4 today, goal >7.  HTN: HOLD home med Lisinopril 10 mg qd in the setting of hypotension and elevated creatinine on admission.  -Pt started on Carvedilol 3.125 mg BID  DVT ppx: Coumadin   Code: FULL  Dispo: Disposition is deferred at this time, awaiting improvement of current medical problems.  Anticipated discharge in approximately 0 day(s).   The patient does have a current PCP Everardo Beals, NP) and does need an [facility] follow-up appointment after discharge.  The patient does not have transportation limitations that hinder transportation to clinic appointments.  .Services Needed at time of discharge: Y = Yes, Blank = No PT:   OT:   RN:   Equipment:   Other:     LOS: 3 days   [name], MD 09/05/2015, 8:22 AM

## 2015-09-05 NOTE — Progress Notes (Signed)
1.  Do you need a wheel chair?    no  2. On oxygen?   no  3. Have you ever had any surgery in the body part being scanned?  no  4. Have you ever had any surgery on your brain or heart?                  no  5. Have you ever had surgery on your eyes or ears?       no                          6. Do you have a pacemaker or defibrillator?   no  7. Do you have a Neurostimulator?  no 8. Claustrophobic?  Yes- needs to take Ativan  9. Any risk for metal in eyes?  no  10. Injury by bullet, buckshot, or shrapnel?  no  11. Stent?   no                                                                                                                          12. Hx of Cancer?  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 Kidney or Liver disease? 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 Hx of Lupus, Rheumatoid Arthritis or Scleroderma?  no  15. IV Antibiotics or long term use of NSAIDS?  62. HX of Hypertension?  no  17. Diabetes?   no  18. Allergy to contrast?   no  19. Recent labs. Will need labs there at GI prior to scan.   Erika [name] - These questions were reviewed with the patient on 9/23 via phone

## 2015-09-05 NOTE — Progress Notes (Signed)
Patient has lorazepam 0.5 mg on med list from 08/12/15.patient will take 1 sl prior to scan and take bottle with her if she needs an additional pill for total of 1 mg.

## 2015-09-06 ENCOUNTER — Emergency Department (HOSPITAL_COMMUNITY)
Admission: EM | Admit: 2015-09-06 | Discharge: 2015-09-06 | Disposition: A | Discharge status: Home / Self Care | Payer: No Typology Code available for payment source | Attending: Emergency Medicine | Admitting: Emergency Medicine

## 2015-09-06 ENCOUNTER — Emergency Department (HOSPITAL_COMMUNITY): Payer: No Typology Code available for payment source

## 2015-09-06 ENCOUNTER — Encounter (HOSPITAL_COMMUNITY): Payer: Self-pay

## 2015-09-06 DIAGNOSIS — F419 Anxiety disorder, unspecified: Secondary | ICD-10-CM | POA: Diagnosis not present

## 2015-09-06 DIAGNOSIS — R079 Chest pain, unspecified: Secondary | ICD-10-CM | POA: Insufficient documentation

## 2015-09-06 DIAGNOSIS — Z853 Personal history of malignant neoplasm of breast: Secondary | ICD-10-CM | POA: Insufficient documentation

## 2015-09-06 DIAGNOSIS — I509 Heart failure, unspecified: Secondary | ICD-10-CM | POA: Insufficient documentation

## 2015-09-06 DIAGNOSIS — Z862 Personal history of diseases of the blood and blood-forming organs and certain disorders involving the immune mechanism: Secondary | ICD-10-CM | POA: Diagnosis not present

## 2015-09-06 DIAGNOSIS — R0602 Shortness of breath: Secondary | ICD-10-CM | POA: Diagnosis present

## 2015-09-06 DIAGNOSIS — Z79899 Other long term (current) drug therapy: Secondary | ICD-10-CM | POA: Diagnosis not present

## 2015-09-06 DIAGNOSIS — R0789 Other chest pain: Secondary | ICD-10-CM

## 2015-09-06 DIAGNOSIS — Z86018 Personal history of other benign neoplasm: Secondary | ICD-10-CM | POA: Diagnosis not present

## 2015-09-06 LAB — URINALYSIS, ROUTINE W REFLEX MICROSCOPIC
Bilirubin Urine: NEGATIVE
GLUCOSE, UA: NEGATIVE mg/dL
HGB URINE DIPSTICK: NEGATIVE
KETONES UR: NEGATIVE mg/dL
Nitrite: NEGATIVE
PROTEIN: NEGATIVE mg/dL
Specific Gravity, Urine: 1.02 (ref 1.005–1.030)
UROBILINOGEN UA: 2 mg/dL — AB (ref 0.0–1.0)
pH: 7.5 (ref 5.0–8.0)

## 2015-09-06 LAB — COMPREHENSIVE METABOLIC PANEL
ALT: 11 U/L — ABNORMAL LOW (ref 14–54)
AST: 36 U/L (ref 15–41)
Albumin: 2.8 g/dL — ABNORMAL LOW (ref 3.5–5.0)
Alkaline Phosphatase: 136 U/L — ABNORMAL HIGH (ref 38–126)
Anion gap: 9 (ref 5–15)
BILIRUBIN TOTAL: 0.4 mg/dL (ref 0.3–1.2)
BUN: 21 mg/dL — AB (ref 6–20)
CO2: 23 mmol/L (ref 22–32)
CREATININE: 0.95 mg/dL (ref 0.44–1.00)
Calcium: 9.4 mg/dL (ref 8.9–10.3)
Chloride: 106 mmol/L (ref 101–111)
Glucose, Bld: 93 mg/dL (ref 65–99)
POTASSIUM: 4.1 mmol/L (ref 3.5–5.1)
Sodium: 138 mmol/L (ref 135–145)
TOTAL PROTEIN: 9.1 g/dL — AB (ref 6.5–8.1)

## 2015-09-06 LAB — CBC WITH DIFFERENTIAL/PLATELET
BASOS PCT: 0 %
Basophils Absolute: 0 10*3/uL (ref 0.0–0.1)
EOS PCT: 0 %
Eosinophils Absolute: 0 10*3/uL (ref 0.0–0.7)
HEMATOCRIT: 31.5 % — AB (ref 36.0–46.0)
Hemoglobin: 10.3 g/dL — ABNORMAL LOW (ref 12.0–15.0)
LYMPHS ABS: 1.6 10*3/uL (ref 0.7–4.0)
Lymphocytes Relative: 12 %
MCH: 28.5 pg (ref 26.0–34.0)
MCHC: 32.7 g/dL (ref 30.0–36.0)
MCV: 87.3 fL (ref 78.0–100.0)
MONO ABS: 1.2 10*3/uL — AB (ref 0.1–1.0)
MONOS PCT: 9 %
NEUTROS ABS: 10.6 10*3/uL — AB (ref 1.7–7.7)
Neutrophils Relative %: 79 %
PLATELETS: 500 10*3/uL — AB (ref 150–400)
RBC: 3.61 MIL/uL — AB (ref 3.87–5.11)
RDW: 22.1 % — AB (ref 11.5–15.5)
WBC: 13.4 10*3/uL — ABNORMAL HIGH (ref 4.0–10.5)

## 2015-09-06 LAB — I-STAT TROPONIN, ED: Troponin i, poc: 0 ng/mL (ref 0.00–0.08)

## 2015-09-06 LAB — URINE MICROSCOPIC-ADD ON

## 2015-09-06 MED ORDER — OXYCODONE-ACETAMINOPHEN 5-325 MG PO TABS: 2.0000 | ORAL_TABLET | Freq: Once | ORAL | Status: DC

## 2015-09-06 MED ORDER — HYDROMORPHONE HCL 2 MG PO TABS: 2.0000 mg | ORAL_TABLET | Freq: Four times a day (QID) | ORAL | Status: DC | PRN

## 2015-09-06 MED ORDER — HYDROMORPHONE HCL 2 MG/ML IJ SOLN
2.0000 mg | Freq: Once | INTRAMUSCULAR | Status: AC
  Administered 2015-09-06: 2 mg via INTRAMUSCULAR
  Filled 2015-09-06: qty 1

## 2015-09-06 MED ORDER — HYDROMORPHONE HCL 2 MG/ML IJ SOLN: 2.0000 mg | Freq: Once | INTRAMUSCULAR | Status: DC

## 2015-09-06 NOTE — ED Provider Notes (Signed)
CSN: [id number]     Arrival date & time 09/06/15  1156 History   First MD Initiated Contact with Patient 09/06/15 1209     Chief Complaint  Patient presents with  . Shortness of Breath     HPI   Erika [name] is a 44 y.o. female with a PMH of systolic CHF with EF 40-98%, metastatic breast cancer s/p mastectomy on PO chemo who presents to the ED with shortness of breath. She states she was discharged from the hospital yesterday, but has not felt well since. She reports intermittent shortness of breath and chest pain under her right breast. She states her symptoms are intermittent. She is unable to identify anything that exacerbates her pain. She has taken her home pain medication, with little symptom relief. She denies fever, chills, headache, lightheadedness, dizziness, loss of consciousness, abdominal pain, N/V/D/C.   Past Medical History  Diagnosis Date  . Headache(784.0)     Hx: of Migraines  . Anxiety     ANXIOUS ABOUT HAVEING BIG SURGERY - MASTECTOMY  . 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 DVT (deep venous thrombosis)     RT ARM-PT ON LOVENOX  . S/P radiation therapy 02-04-14 to 03-18-14                               1) Left Chest Wall / 50 Gy in 25 fractions/ 2) Left Supraclavicular fossa/ 46 Gy in 23 fractions / 3) Left Posterior Axillary boost / 8.533 Gy in 23 fractions/ 4) Left Chest Wall Scar boost / 10 Gy in 5 fractions  . CHF (congestive heart failure)   . Iron deficiency anemia 11/26/13  . Cancer of left breast     Invasive High Grade Carcinoma; S/P mastectomy, Adriamycin and Cytoxan, radiation  . Metastases to the liver   . Metastasis to lung    Past Surgical History  Procedure Laterality Date  . Portacath placement Right 05/22/2013    Procedure: INSERTION PORT-A-CATH;  Surgeon: [name], MD;  Location: [facility] OR;  Service: General;  Laterality: Right;  . Breast surgery Left 05/21/13; 06/22/13     Needle Core Biopsy  . Mastectomy modified radical Left 11/15/2013     Procedure: LEFT MASTECTOMY MODIFIED RADICAL;  Surgeon: [name], MD;  Location: [facility] ORS;  Service: General;  Laterality: Left;  . Tubal ligation  08/1996   Family History  Problem Relation Age of Onset  . Hypertension Mother   . Diabetes Father 68  . Stomach cancer Maternal Aunt   . Hypertension Maternal Grandmother   . Hypertension Maternal Grandfather   . Diabetes Paternal Grandmother   . Diabetes Paternal Grandfather   . Diabetes Paternal Uncle   . Diabetes Paternal Uncle   . Diabetes Paternal Uncle    Social History  Substance Use Topics  . Smoking status: Never Smoker   . Smokeless tobacco: Never Used  . Alcohol Use: No   OB History    No data available      Review of Systems  Constitutional: Negative for fever and chills.  Respiratory: Positive for shortness of breath.   Cardiovascular: Positive for chest pain.  Gastrointestinal: Negative for nausea, vomiting, abdominal pain, diarrhea and constipation.  Genitourinary: Negative for dysuria, urgency and frequency.  Neurological: Negative for dizziness, weakness, light-headedness and headaches.  All other systems reviewed and are negative.     Allergies  Doxycycline  Home Medications  Prior to Admission medications   Medication Sig Start Date End Date Taking? Authorizing Provider  capecitabine (XELODA) 500 MG tablet Take 3 tablets (1,500 mg total) by mouth 2 (two) times daily after a meal. Patient taking differently: Take 1,500 mg by mouth 2 (two) times daily after a meal. Rotating for a week on then a week off 08/12/15  Yes [name], NP  carvedilol (COREG) 3.125 MG tablet Take 1 tablet (3.125 mg total) by mouth 2 (two) times daily with a meal. 09/05/15  Yes [name], MD  cyclobenzaprine (FLEXERIL) 5 MG tablet TAKE 1 TABLET BY MOUTH THREE TIMES A DAY FOR MUSCLE SPASMS 08/12/15  Yes [name], NP  dexamethasone (DECADRON) 4 MG tablet Take 1 tablet (4 mg total) by mouth 2 (two)  times daily. 09/05/15  Yes [name], MD  fentaNYL (DURAGESIC - DOSED MCG/HR) 25 MCG/HR patch Place 1 patch (25 mcg total) onto the skin every 3 (three) days. 08/12/15  Yes [name], NP  fentaNYL (DURAGESIC - DOSED MCG/HR) 75 MCG/HR Place 1 patch (75 mcg total) onto the skin every 3 (three) days. 08/12/15  Yes [name], NP  gabapentin (NEURONTIN) 300 MG capsule Take 1 capsule (300 mg total) by mouth 3 (three) times daily. 04/15/15  Yes [name], MD  loratadine (CLARITIN) 10 MG tablet Take 10 mg by mouth daily as needed for allergies.   Yes Historical Provider, MD  LORazepam (ATIVAN) 0.5 MG tablet Take 1 tablet (0.5 mg total) by mouth every 6 (six) hours as needed for anxiety. 08/12/15  Yes [name], NP  ondansetron (ZOFRAN) 8 MG tablet Take 1 tablet (8 mg total) by mouth 2 (two) times daily. 08/12/15  Yes [name], NP  oxyCODONE (OXY IR/ROXICODONE) 5 MG immediate release tablet 1-2 tablets every 8 hrs as needed for severe or breakthrough pain Patient taking differently: Take 5 mg by mouth every 4 (four) hours as needed for moderate pain or severe pain. 1-2 tablets every 8 hrs as needed for severe or breakthrough pain 08/12/15  Yes [name], NP  potassium chloride SA (K-DUR,KLOR-CON) 20 MEQ tablet Take 1 tablet (20 mEq total) by mouth 4 (four) times daily. 07/01/15  Yes [name], NP  prochlorperazine (COMPAZINE) 10 MG tablet Take 1 tablet (10 mg total) by mouth every 8 (eight) hours as needed for nausea or vomiting. 08/14/15  Yes [name], MD  warfarin (COUMADIN) 5 MG tablet Take 1.25-2.5 mg by mouth daily at 6 PM. Patient is getting regulated on Coumadin. Pt takes 2.5 mg M,W,F, all other days 1.25 mg (1/2 tablet).   Yes Historical Provider, MD  zolpidem (AMBIEN) 5 MG tablet Take 1 tablet (5 mg total) by mouth at bedtime as needed for sleep. 06/27/14  Yes Cleaster Corin Cornetto, NP    BP 105/80 mmHg  Pulse 102  Temp(Src) 98.4 F (36.9  C) (Oral)  Resp 32  SpO2 99%  LMP 05/30/2013 Physical Exam  Constitutional: She is oriented to person, place, and time. No distress.  Chronically ill-appearing female in no acute distress.  HENT:  Head: Normocephalic and atraumatic.  Right Ear: External ear normal.  Left Ear: External ear normal.  Nose: Nose normal.  Mouth/Throat: Uvula is midline, oropharynx is clear and moist and mucous membranes are normal.  Eyes: Conjunctivae, EOM and lids are normal. Pupils are equal, round, and reactive to light. Right eye exhibits no discharge. Left eye exhibits no discharge. No scleral  icterus.  Neck: Normal range of motion. Neck supple.  Cardiovascular: Normal rate, regular rhythm, normal heart sounds, intact distal pulses and normal pulses.   Pulmonary/Chest: Effort normal. No respiratory distress. She has decreased breath sounds in the right lower field. She has no wheezes. She has no rales. She exhibits no tenderness.  Abdominal: Soft. Normal appearance and bowel sounds are normal. She exhibits no distension and no mass. There is no tenderness. There is no rigidity, no rebound and no guarding.  Musculoskeletal: Normal range of motion. She exhibits no edema or tenderness.  Neurological: She is alert and oriented to person, place, and time. She has normal strength. No sensory deficit.  Skin: Skin is warm, dry and intact. No rash noted. She is not diaphoretic. No erythema. No pallor.  Psychiatric: She has a normal mood and affect. Her speech is normal and behavior is normal. Judgment and thought content normal.  Nursing note and vitals reviewed.   ED Course  Procedures (including critical care time)  Labs Review Labs Reviewed  CBC WITH DIFFERENTIAL/PLATELET - Abnormal; Notable for the following:    WBC 13.4 (*)    RBC 3.61 (*)    Hemoglobin 10.3 (*)    HCT 31.5 (*)    RDW 22.1 (*)    Platelets 500 (*)    Neutro Abs 10.6 (*)    Monocytes Absolute 1.2 (*)    All other components within  normal limits  COMPREHENSIVE METABOLIC PANEL - Abnormal; Notable for the following:    BUN 21 (*)    Total Protein 9.1 (*)    Albumin 2.8 (*)    ALT 11 (*)    Alkaline Phosphatase 136 (*)    All other components within normal limits  URINALYSIS, ROUTINE W REFLEX MICROSCOPIC (NOT AT [facility]) - Abnormal; Notable for the following:    Urobilinogen, UA 2.0 (*)    Leukocytes, UA TRACE (*)    All other components within normal limits  URINE MICROSCOPIC-ADD ON  Randolm Idol, ED    Imaging Review Dg Chest 2 View  09/06/2015   CLINICAL DATA:  Shortness of breath, weakness. History of breast cancer.  EXAM: CHEST  2 VIEW  COMPARISON:  09/02/2015  FINDINGS: Right Port-A-Cath remains in place, unchanged. Right pleural effusion and multiple right lung nodules and masses again noted, unchanged. No confluent opacity on the left. Heart is normal size.  IMPRESSION: Stable right lung nodules and masses with right pleural effusion.  No significant change since prior study.   Electronically Signed   By: Rolm Baptise M.D.   On: 09/06/2015 13:05   I have personally reviewed and evaluated these images and lab results as part of my medical decision-making.   EKG Interpretation   Date/Time:  Saturday September 06 2015 12:10:36 EDT Ventricular Rate:  93 PR Interval:  141 QRS Duration: 76 QT Interval:  347 QTC Calculation: 432 R Axis:   44 Text Interpretation:  Sinus rhythm Borderline repolarization abnormality  No significant change since last tracing Confirmed by Maryan Rued  MD,  Loree Fee ([id number]) on 09/06/2015 1:13:13 PM      MDM   Final diagnoses:  Non-cardiac chest pain  Shortness of breath    44 year old female presents with shortness of breath and pain, which she states is located under her right breast. She states she could not get comfortable and had difficulty sleeping last night due to her symptoms. She denies fever, chills, headache, lightheadedness, dizziness, loss of consciousness,  abdominal pain, N/V/D/C. Patient  recently admitted for hypotension and AKI, subsequently discharged yesterday.  Patient is afebrile. Mildly tachycardic and tachypneic. Heart regular rhythm. Decreased breath sounds to right lower lobe. Abdomen soft, non-tender, non-distended. No lower extremity edema.   CBC with leukocytosis, which appears stable. Hemoglobin 10.3, which is chronic. CMP with elevated ALP. EKG no acute ischemia. Troponin negative x 1. No evidence of infection on UA.  Symptoms seem consistent with poor pain control.  Plan to control pain in the ED and reassess patient, with likely discharge with PCP follow-up. 2 mg dilaudid given. Patient signed out to [facility], PA-C.  BP 105/80 mmHg  Pulse 102  Temp(Src) 98.4 F (36.9 C) (Oral)  Resp 32  SpO2 99%  LMP 05/30/2013       Marella Chimes, PA-C 09/07/15 1758  Blanchie Dessert, MD 09/07/15 2203

## 2015-09-06 NOTE — ED Notes (Addendum)
She states she was released from [facility] yesterday for "heart failure" issues.  She states a chemotherapy agent caused her heart failure.  She further tells me she was recently prescribed Lisinopril.  She is here today with c/o persistent shortness of breath and weakness.  EKG performed upon arrival to rm. 17.

## 2015-09-06 NOTE — ED Provider Notes (Signed)
4:00 PM: pt signed out to me by Lenell Antu, PA-C. Hx of metastatic breast cancer, admitted recently for hypotension and AKI and d/c yesterday.  Had SOB and pain under right breast, so returned to ED.  Labs nl.  EKG nl.  CXR shows right chronic effusion.  Trop neg.  Vitals have show tachyvcardia and tachypnea throughout the day.  Dr. Maryan Rued believes this to be a pain control issue, so pt received 2 mg diluadid.  Check BP.  Follow up with PCP. Pt d/c home.  5:00 PM: Patient's pain is much improved after 2 mg dilaudid.  VSS.  This appears to be a pain control issue and patient will follow up with PCP on Tuesday for pain management.  Pt states she has a fentanyl patch as well as roxicodone 5mg  for pain.  Patient agrees and acknowledges the above plan.  Discussed return precautions.  Pt is stable for discharge.  Gloriann Loan, PA-C 09/06/15 [phone]

## 2015-09-06 NOTE — Discharge Instructions (Signed)

## 2015-09-09 ENCOUNTER — Telehealth: Payer: Self-pay | Admitting: *Deleted

## 2015-09-09 ENCOUNTER — Telehealth: Payer: Self-pay | Admitting: Oncology

## 2015-09-09 ENCOUNTER — Ambulatory Visit (HOSPITAL_BASED_OUTPATIENT_CLINIC_OR_DEPARTMENT_OTHER): Payer: No Typology Code available for payment source | Admitting: Oncology

## 2015-09-09 ENCOUNTER — Other Ambulatory Visit (HOSPITAL_BASED_OUTPATIENT_CLINIC_OR_DEPARTMENT_OTHER): Payer: No Typology Code available for payment source

## 2015-09-09 ENCOUNTER — Ambulatory Visit (HOSPITAL_BASED_OUTPATIENT_CLINIC_OR_DEPARTMENT_OTHER): Payer: No Typology Code available for payment source | Admitting: Pharmacist

## 2015-09-09 ENCOUNTER — Ambulatory Visit (HOSPITAL_BASED_OUTPATIENT_CLINIC_OR_DEPARTMENT_OTHER): Payer: No Typology Code available for payment source

## 2015-09-09 VITALS — BP 95/69 | HR 85 | Temp 97.9°F | Resp 18 | Ht 65.5 in | Wt 163.5 lb

## 2015-09-09 DIAGNOSIS — I823 Embolism and thrombosis of renal vein: Secondary | ICD-10-CM

## 2015-09-09 DIAGNOSIS — C50212 Malignant neoplasm of upper-inner quadrant of left female breast: Secondary | ICD-10-CM

## 2015-09-09 DIAGNOSIS — E860 Dehydration: Secondary | ICD-10-CM

## 2015-09-09 DIAGNOSIS — C7931 Secondary malignant neoplasm of brain: Secondary | ICD-10-CM

## 2015-09-09 DIAGNOSIS — D630 Anemia in neoplastic disease: Secondary | ICD-10-CM | POA: Diagnosis not present

## 2015-09-09 DIAGNOSIS — I427 Cardiomyopathy due to drug and external agent: Secondary | ICD-10-CM

## 2015-09-09 DIAGNOSIS — I509 Heart failure, unspecified: Secondary | ICD-10-CM

## 2015-09-09 DIAGNOSIS — T451X5A Adverse effect of antineoplastic and immunosuppressive drugs, initial encounter: Secondary | ICD-10-CM

## 2015-09-09 DIAGNOSIS — C50912 Malignant neoplasm of unspecified site of left female breast: Secondary | ICD-10-CM

## 2015-09-09 DIAGNOSIS — C50919 Malignant neoplasm of unspecified site of unspecified female breast: Secondary | ICD-10-CM | POA: Insufficient documentation

## 2015-09-09 DIAGNOSIS — I82409 Acute embolism and thrombosis of unspecified deep veins of unspecified lower extremity: Secondary | ICD-10-CM

## 2015-09-09 DIAGNOSIS — D6481 Anemia due to antineoplastic chemotherapy: Secondary | ICD-10-CM

## 2015-09-09 LAB — CBC WITH DIFFERENTIAL/PLATELET
BASO%: 0.2 % (ref 0.0–2.0)
BASOS ABS: 0 10*3/uL (ref 0.0–0.1)
EOS%: 0 % (ref 0.0–7.0)
Eosinophils Absolute: 0 10*3/uL (ref 0.0–0.5)
HEMATOCRIT: 30.6 % — AB (ref 34.8–46.6)
HEMOGLOBIN: 9.4 g/dL — AB (ref 11.6–15.9)
LYMPH#: 0.8 10*3/uL — AB (ref 0.9–3.3)
LYMPH%: 4.4 % — ABNORMAL LOW (ref 14.0–49.7)
MCH: 27.1 pg (ref 25.1–34.0)
MCHC: 30.9 g/dL — ABNORMAL LOW (ref 31.5–36.0)
MCV: 87.7 fL (ref 79.5–101.0)
MONO#: 0.9 10*3/uL (ref 0.1–0.9)
MONO%: 4.7 % (ref 0.0–14.0)
NEUT#: 17.1 10*3/uL — ABNORMAL HIGH (ref 1.5–6.5)
NEUT%: 90.7 % — ABNORMAL HIGH (ref 38.4–76.8)
Platelets: 563 10*3/uL — ABNORMAL HIGH (ref 145–400)
RBC: 3.49 10*6/uL — ABNORMAL LOW (ref 3.70–5.45)
RDW: 25.3 % — AB (ref 11.2–14.5)
WBC: 18.8 10*3/uL — ABNORMAL HIGH (ref 3.9–10.3)

## 2015-09-09 LAB — COMPREHENSIVE METABOLIC PANEL (CC13)
ALBUMIN: 2.3 g/dL — AB (ref 3.5–5.0)
ALK PHOS: 140 U/L (ref 40–150)
ALT: 16 U/L (ref 0–55)
AST: 40 U/L — AB (ref 5–34)
Anion Gap: 7 mEq/L (ref 3–11)
BUN: 21.5 mg/dL (ref 7.0–26.0)
CALCIUM: 9.6 mg/dL (ref 8.4–10.4)
CO2: 25 mEq/L (ref 22–29)
CREATININE: 1 mg/dL (ref 0.6–1.1)
Chloride: 104 mEq/L (ref 98–109)
EGFR: 77 mL/min/{1.73_m2} — ABNORMAL LOW (ref >90)
Glucose: 99 mg/dl (ref 70–140)
POTASSIUM: 5.3 meq/L — AB (ref 3.5–5.1)
Sodium: 136 mEq/L (ref 136–145)
Total Bilirubin: 0.32 mg/dL (ref 0.20–1.20)
Total Protein: 8.3 g/dL (ref 6.4–8.3)

## 2015-09-09 LAB — PROTIME-INR
INR: 3.9 — ABNORMAL HIGH (ref 2.00–3.50)
PROTIME: 46.8 s — AB (ref 10.6–13.4)

## 2015-09-09 LAB — POCT INR: INR: 3.9

## 2015-09-09 MED ORDER — FENTANYL 75 MCG/HR TD PT72: 75.0000 ug | MEDICATED_PATCH | TRANSDERMAL | Status: DC

## 2015-09-09 MED ORDER — CYCLOBENZAPRINE HCL 5 MG PO TABS: ORAL_TABLET | ORAL | Status: DC

## 2015-09-09 MED ORDER — OXYCODONE HCL 5 MG PO TABS: 5.0000 mg | ORAL_TABLET | ORAL | Status: DC | PRN

## 2015-09-09 MED ORDER — OXYCODONE HCL 5 MG PO TABS: ORAL_TABLET | ORAL | Status: DC

## 2015-09-09 MED ORDER — DARBEPOETIN ALFA 200 MCG/0.4ML IJ SOSY
200.0000 ug | PREFILLED_SYRINGE | Freq: Once | INTRAMUSCULAR | Status: AC
  Administered 2015-09-09: 200 ug via SUBCUTANEOUS
  Filled 2015-09-09: qty 0.4

## 2015-09-09 MED ORDER — FENTANYL 25 MCG/HR TD PT72: 25.0000 ug | MEDICATED_PATCH | TRANSDERMAL | Status: DC

## 2015-09-09 MED ORDER — LORAZEPAM 0.5 MG PO TABS: 0.5000 mg | ORAL_TABLET | Freq: Four times a day (QID) | ORAL | Status: DC | PRN

## 2015-09-09 NOTE — Patient Instructions (Addendum)
Hold coumadin tonight x 1 dose Slight decrease to 1 mg daily (half tablet)  Recheck INR on 09/17/15. Lab at 11am after radiation appointments

## 2015-09-09 NOTE — Telephone Encounter (Signed)
Gaspar Bidding called for clarification of sig for script.  Instructed to use second half that was added text, not the default Epic enters automatically.  Entry re-entered to show how patient is using t his pain medicine.

## 2015-09-09 NOTE — Progress Notes (Signed)
INR above goal today at 3.9 (goal 2-3) Pt seen by Dr. Jana Hakim today Pt with new brain lesions. Plan for stereotactic radiotherapy starting next week and hold on xeloda for now.  Pt has been off xeloda x 1 week. She was in the hospital last week with hypotension, elevated scr and elevated INR.  She received po vit k 5mg  x 1 dose on 9/21 for INR > 5. Coumadin held 9/20 and 9/21 Pt reports no bleeding or bruising Appetite is improving per Ms. Mui. She is feeling better but is still tired Her renal function has improved as well with hydration.  Even though xeloda is on hold INR continues to trend up. Will decrease dose again Pt aware of coumadin clinic closure. Pt to be managed by Dr. Jana Hakim for anticoagulation  Plan: Hold coumadin tonight x 1 dose Slight decrease to 1 mg daily (half tablet)  Recheck INR on 09/17/15. Lab at 11am after radiation appointments. Dr. Jana Hakim will provide instructions  *This is patient's last coumadin clinic visit.    Samples:  Coumadin 2 mg tab x 60 tabs LOT: [id number] Exp: 01/2016

## 2015-09-09 NOTE — Telephone Encounter (Signed)
Gave avs & calendar for October. Patient aware Radiology will call and schedule 2-4 week before for PET scan.

## 2015-09-09 NOTE — Progress Notes (Signed)
ID: Erika [name] OB: 1971/03/10  MR#: [id number]  CSN#:[id number]  PCP: Erika Pillow, NP GYN:   SU: Dr. Donne [name] OTHER MD:  CHIEF COMPLAINT:  metastatic breast cancer, congestive heart failure  CURRENT TREATMENT: capecitabine; SRS pending  BREAST CANCER HISTORY: From Dr. Dana [name] original intake note:  "Patient developed a left sided breast mass show up about 6 months prior to diagnosis. 2 months prior this began getting larger rapidly. She was seen in an urgent care where this mass was thought to be an abscess. Apparently there was an attempt to excise or drain this that was not successful and she was referred to Dr. Donne [name] for evaluation. She underwent punch biopsy on 04/23/13 of her skin and the mass. She was then also sent for mm/us. The biopsy shows invasive high grade carcinoma that appears to be breast. It is ER 14%, PR negative, HER-2/neu negative with a Ki-67 that approaches 100%. She has also undergone mm with at least 10 cm upper inner left breast tumor with mild overyling skin thickening. There is also a 3x10 mm cluster of calcs near there also. There is indeterminate 7 mm level 1 left axillary node, she underwent a lymph node biopsy that was positive for disease. No mm evidence of right breast malignancy. An MRI of the breasts was done on 05/22/13 and showed a 14 cm mass in the left breast protruding from the skin and also a 2.5 cm level II axillary node."  Her subsequent history is as detailed below  INTERVAL HISTORY:   Erika [name] returns today for follow up of her breast cancer, accompanied by her significant other, Erika [name].  Since her last visit here she was admitted [facility] with significant hypotension and acute renal failure. Part of this may have been due to her lisinopril and caloric, started because of her congestive heart failure. The medications were held, she was hydrated, and her symptoms  And labs improved. However during the course of the  hospitalization she was observed to be persistently nauseated. Accordingly a brain MRI was obtained 09/04/2015 and this showed a right occipital lesion measuring 7 mm associated with edema. She was started on steroids, with a significant improvement in her blood pressure , suggesting a possible element of adrenal insufficiency in the hypotension she had recently experienced. Before discharge she was started on Coreg at 3.125 mg twice a day. She has been off capecitabine now for one week.  REVIEW OF SYSTEMS:  Erika [name]  's feeling a little bit more normal but "not all there yet". She is very tired, short of breath, and has "not been doing much". She is anxious about the brain radiation , worried about what it might be lichen what it might do to her. She has not had any recent fevers, bleeding, or rash. She denies diarrhea or constipation problems or any change in bladder habits. Her pain is well-controlled on her current medications. She uses 2 separate Duragesic patches because she found 100 mcg/h patch would not "stick". She denies headaches, nausea or vomiting. Denies dizziness or falls. Her vision is at times blurred. She is tolerating the dexamethasone without any thrush or other side effects that she is aware of. A detailed review of systems was otherwise stable  PAST MEDICAL HISTORY: Past Medical History  Diagnosis Date  . Headache(784.0)     Hx: of Migraines  . Anxiety     ANXIOUS ABOUT HAVEING BIG SURGERY - MASTECTOMY  . Neuropathy     SIDE EFFECTS FROM  CHEMO  . DVT (deep venous thrombosis)     RT ARM-PT ON LOVENOX  . S/P radiation therapy 02-04-14 to 03-18-14                               1) Left Chest Wall / 50 Gy in 25 fractions/ 2) Left Supraclavicular fossa/ 46 Gy in 23 fractions / 3) Left Posterior Axillary boost / 8.533 Gy in 23 fractions/ 4) Left Chest Wall Scar boost / 10 Gy in 5 fractions  . CHF (congestive heart failure)   . Iron deficiency anemia 11/26/13  . Cancer of left breast      Invasive High Grade Carcinoma; S/P mastectomy, Adriamycin and Cytoxan, radiation  . Metastases to the liver   . Metastasis to lung     PAST SURGICAL HISTORY: Past Surgical History  Procedure Laterality Date  . Portacath placement Right 05/22/2013    Procedure: INSERTION PORT-A-CATH;  Surgeon: Erika Bookbinder, MD;  Location: [facility] OR;  Service: General;  Laterality: Right;  . Breast surgery Left 05/21/13; 06/22/13     Needle Core Biopsy  . Mastectomy modified radical Left 11/15/2013    Procedure: LEFT MASTECTOMY MODIFIED RADICAL;  Surgeon: Erika Bookbinder, MD;  Location: [facility] ORS;  Service: General;  Laterality: Left;  . Tubal ligation  08/1996    FAMILY HISTORY Family History  Problem Relation Age of Onset  . Hypertension Mother   . Diabetes Father 27  . Stomach cancer Maternal Aunt   . Hypertension Maternal Grandmother   . Hypertension Maternal Grandfather   . Diabetes Paternal Grandmother   . Diabetes Paternal Grandfather   . Diabetes Paternal Uncle   . Diabetes Paternal Uncle   . Diabetes Paternal Uncle   The patient's mother is alive, age 2. The patient's father died from complications of diabetes at the age of 9. The patient has 2 brothers, one sister. There is no history of breast or ovarian cancer in the family to her knowledge .the patient has not undergone genetic testing   GYNECOLOGIC HISTORY:  Menarche age 6, first live birth age 63. The patient is GX P3. The patient has been on goserelin since 06/13/2013, given monthly  SOCIAL HISTORY:  The patient has worked at as a Furniture conservator/restorer and a Retail buyer. She is currently applying for disability. At home she lives with her youngest son, Erika [name], 79; with [name]; and with [name].   ADVANCED DIRECTIVES: Not in place; at the 09/09/2015 visit the patient was given advanced directives to complete at her discretion; I suggested she bring it next visit so  it can be  notarized  HEALTH MAINTENANCE: Social History  Substance Use Topics  . Smoking status: Never Smoker   . Smokeless tobacco: Never Used  . Alcohol Use: No    Allergies  Allergen Reactions  . Doxycycline Nausea And Vomiting    Current Outpatient Prescriptions  Medication Sig Dispense Refill  . capecitabine (XELODA) 500 MG tablet Take 3 tablets (1,500 mg total) by mouth 2 (two) times daily after a meal. (Patient taking differently: Take 1,500 mg by mouth 2 (two) times daily after a meal. Rotating for a week on then a week off) 84 tablet 3  . carvedilol (COREG) 3.125 MG tablet Take 1 tablet (3.125 mg total) by mouth 2 (two) times daily with a meal. 60 tablet 0  . cyclobenzaprine (FLEXERIL) 5 MG tablet TAKE 1 TABLET BY  MOUTH THREE TIMES A DAY FOR MUSCLE SPASMS 30 tablet 1  . dexamethasone (DECADRON) 4 MG tablet Take 1 tablet (4 mg total) by mouth 2 (two) times daily. 8 tablet 0  . fentaNYL (DURAGESIC - DOSED MCG/HR) 25 MCG/HR patch Place 1 patch (25 mcg total) onto the skin every 3 (three) days. 10 patch 0  . fentaNYL (DURAGESIC - DOSED MCG/HR) 75 MCG/HR Place 1 patch (75 mcg total) onto the skin every 3 (three) days. 10 patch 0  . gabapentin (NEURONTIN) 300 MG capsule Take 1 capsule (300 mg total) by mouth 3 (three) times daily. 90 capsule 2  . loratadine (CLARITIN) 10 MG tablet Take 10 mg by mouth daily as needed for allergies.    Marland Kitchen LORazepam (ATIVAN) 0.5 MG tablet Take 1 tablet (0.5 mg total) by mouth every 6 (six) hours as needed for anxiety. 30 tablet 0  . ondansetron (ZOFRAN) 8 MG tablet Take 1 tablet (8 mg total) by mouth 2 (two) times daily. 20 tablet 1  . oxyCODONE (OXY IR/ROXICODONE) 5 MG immediate release tablet 1-2 tablets every 8 hrs as needed for severe or breakthrough pain (Patient taking differently: Take 5 mg by mouth every 4 (four) hours as needed for moderate pain or severe pain. 1-2 tablets every 8 hrs as needed for severe or breakthrough pain) 180 tablet 0  . potassium  chloride SA (K-DUR,KLOR-CON) 20 MEQ tablet Take 1 tablet (20 mEq total) by mouth 4 (four) times daily. 120 tablet 1  . prochlorperazine (COMPAZINE) 10 MG tablet Take 1 tablet (10 mg total) by mouth every 8 (eight) hours as needed for nausea or vomiting. 30 tablet 0  . warfarin (COUMADIN) 5 MG tablet Take 1.25-2.5 mg by mouth daily at 6 PM. Patient is getting regulated on Coumadin. Pt takes 2.5 mg M,W,F, all other days 1.25 mg (1/2 tablet).    . zolpidem (AMBIEN) 5 MG tablet Take 1 tablet (5 mg total) by mouth at bedtime as needed for sleep. 30 tablet 0   No current facility-administered medications for this visit.   Facility-Administered Medications Ordered in Other Visits  Medication Dose Route Frequency Provider Last Rate Last Dose  . Darbepoetin Alfa (ARANESP) injection 200 mcg  200 mcg Subcutaneous Once [name], NP      . sodium chloride 0.9 % injection 10 mL  10 mL Intravenous PRN [name], MD   10 mL at 06/19/15 1330  . topical emolient (BIAFINE) emulsion   Topical Daily [name], MD        OBJECTIVE: middle-aged African American woman  We'll appears chronically ill Filed Vitals:   09/09/15 1055  BP: 95/69  Pulse: 85  Temp: 97.9 F (36.6 C)  Resp: 18     Body mass index is 26.78 kg/(m^2).      ECOG FS:2 - Symptomatic, <50% confined to bed   Sclerae unicteric, EOMs intact Oropharynx clear and moist No cervical or supraclavicular adenopathy Lungs no rales or rhonchi , fair excursion bilaterally Heart regular rate and rhythm Abd soft, distended, nontender, positive bowel sounds MSK no focal spinal tenderness Neuro: nonfocal, well oriented,  positive affect Breasts:  deferred  skin: Significant hyperpigmentation and dryness of the palms of both hands, without overt cracking   LAB RESULTS:  CMP     Component Value Date/Time   NA 138 09/06/2015 1310   NA 137 08/26/2015 1341   K 4.1 09/06/2015 1310   K 4.8 08/26/2015 1341   CL 106 09/06/2015 1310  CL 102 06/01/2013 1500   CO2 23 09/06/2015 1310   CO2 28 08/26/2015 1341   GLUCOSE 93 09/06/2015 1310   GLUCOSE 92 08/26/2015 1341   GLUCOSE 95 06/01/2013 1500   BUN 21* 09/06/2015 1310   BUN 20.4 08/26/2015 1341   CREATININE 0.95 09/06/2015 1310   CREATININE 2.1* 08/26/2015 1341   CALCIUM 9.4 09/06/2015 1310   CALCIUM 9.6 08/26/2015 1341   PROT 9.1* 09/06/2015 1310   PROT 8.1 08/26/2015 1341   ALBUMIN 2.8* 09/06/2015 1310   ALBUMIN 2.1* 08/26/2015 1341   AST 36 09/06/2015 1310   AST 25 08/26/2015 1341   ALT 11* 09/06/2015 1310   ALT 7 08/26/2015 1341   ALKPHOS 136* 09/06/2015 1310   ALKPHOS 143 08/26/2015 1341   BILITOT 0.4 09/06/2015 1310   BILITOT 0.35 08/26/2015 1341   GFRNONAA >60 09/06/2015 1310   GFRAA >60 09/06/2015 1310    I No results found for: SPEP  Lab Results  Component Value Date   WBC 18.8* 09/09/2015   NEUTROABS 17.1* 09/09/2015   HGB 9.4* 09/09/2015   HCT 30.6* 09/09/2015   MCV 87.7 09/09/2015   PLT 563* 09/09/2015      Chemistry      Component Value Date/Time   NA 138 09/06/2015 1310   NA 137 08/26/2015 1341   K 4.1 09/06/2015 1310   K 4.8 08/26/2015 1341   CL 106 09/06/2015 1310   CL 102 06/01/2013 1500   CO2 23 09/06/2015 1310   CO2 28 08/26/2015 1341   BUN 21* 09/06/2015 1310   BUN 20.4 08/26/2015 1341   CREATININE 0.95 09/06/2015 1310   CREATININE 2.1* 08/26/2015 1341      Component Value Date/Time   CALCIUM 9.4 09/06/2015 1310   CALCIUM 9.6 08/26/2015 1341   ALKPHOS 136* 09/06/2015 1310   ALKPHOS 143 08/26/2015 1341   AST 36 09/06/2015 1310   AST 25 08/26/2015 1341   ALT 11* 09/06/2015 1310   ALT 7 08/26/2015 1341   BILITOT 0.4 09/06/2015 1310   BILITOT 0.35 08/26/2015 1341       Lab Results  Component Value Date   LABCA2 35 05/18/2013    No components found for: [id number]   Recent Labs Lab 09/09/15 1039  INR 3.90*    Urinalysis    Component Value Date/Time   COLORURINE YELLOW 09/06/2015 1415     STUDIES: Dg Chest 2 View  09/06/2015   CLINICAL DATA:  Shortness of breath, weakness. History of breast cancer.  EXAM: CHEST  2 VIEW  COMPARISON:  09/02/2015  FINDINGS: Right Port-A-Cath remains in place, unchanged. Right pleural effusion and multiple right lung nodules and masses again noted, unchanged. No confluent opacity on the left. Heart is normal size.  IMPRESSION: Stable right lung nodules and masses with right pleural effusion.  No significant change since prior study.   Electronically Signed   By: Erika Baptise M.D.   On: 09/06/2015 13:05   Dg Chest 2 View  09/02/2015   CLINICAL DATA:  Hypotensive at her Dr's office today. Pt has hx of left side breast CA with left side mastectomy. Radiation therapy ending in October 2015. Hx of DVT. Nonsmoker.  EXAM: CHEST - 2 VIEW  COMPARISON:  CT 05/01/2015  FINDINGS: Left lung clear. Surgical clips in the left axilla. Heart size normal. Right subclavian port to the SVC RA junction.  Multiple peripheral right lung nodules. Right pleural thickening or loculated effusion. Significantly decreased right lung volume compared to left. No  pneumothorax. Visualized skeletal structures are unremarkable.  IMPRESSION: 1. Multiple right lung nodules and extensive pleural disease as before. No acute/superimposed abnormality.   Electronically Signed   By: Lucrezia Europe M.D.   On: 09/02/2015 19:00   Dg Abd 1 View  08/15/2015   CLINICAL DATA:  Abdominal pain and vomiting today.  EXAM: ABDOMEN - 1 VIEW  COMPARISON:  05/01/2015 CT  FINDINGS: Moderate stool in the proximal colon is again noted.  There is no evidence of bowel obstruction, there is no evidence of bowel obstruction or pneumoperitoneum.  No suspicious calcifications are identified.  No acute bony abnormalities are noted.  IMPRESSION: No evidence of acute abnormality.  Moderate stool in the proximal colon again noted.   Electronically Signed   By: Margarette Canada M.D.   On: 08/15/2015 17:25   Mr Brain Wo  Contrast  09/04/2015   CLINICAL DATA:  44 year old female with breast cancer treated with mastectomy and chemotherapy presenting with headache for the past 2 weeks worse over the past few days. Dizziness. Anemia. Subsequent encounter.  EXAM: MRI HEAD WITHOUT CONTRAST  TECHNIQUE: Multiplanar, multiecho pulse sequences of the brain and surrounding structures were obtained without intravenous contrast.  COMPARISON:  05/23/2015 MR.  FINDINGS: Exam is motion degraded.  New from the prior examination is a 7 mm rounded structure with surrounding vasogenic edema within the right occipital lobe highly suspicious for metastatic disease. Contrast was not administered.  Change in appearance of bone marrow of the clivus and upper cervical spine may represent interval treatment of patient's known underlying anemia.  No acute infarct.  No intracranial hemorrhage.  Major intracranial vascular structures are patent.  Partially empty non expanded sella without change.  Minimal exophthalmos.  Polypoid opacification inferior right maxillary sinus.  C4-5 bulge greater to left with mild cord flattening incompletely assessed on the present exam.  IMPRESSION: Exam is motion degraded.  New from the prior examination is a 7 mm rounded structure with surrounding vasogenic edema within the right occipital lobe highly suspicious for metastatic disease. Contrast was not administered.  Change in appearance of bone marrow of the clivus and upper cervical spine may represent interval treatment of patient's known underlying anemia.  No acute infarct.  No intracranial hemorrhage.  C4-5 bulge greater to left with mild cord flattening incompletely assessed on the present exam.  These results will be called to the ordering clinician or representative by the Radiologist Assistant, and communication documented in the PACS or zVision Dashboard.   Electronically Signed   By: Genia Del M.D.   On: 09/04/2015 17:53   US Renal  09/03/2015   CLINICAL  DATA:  Hypertension. Acute kidney injury. Metastatic breast cancer  EXAM: RENAL / URINARY TRACT ULTRASOUND COMPLETE  COMPARISON:  Multiple exams, including 07/10/2015  FINDINGS: Right Kidney:  Length: 10.3 cm. Echogenicity within normal limits. No mass or hydronephrosis visualized.  Left Kidney:  Length: 11.3 cm. Echogenicity within normal limits. No mass or hydronephrosis visualized.  Bladder:  Appears normal for degree of bladder distention.  IMPRESSION: 1. Normal sonographic appearance of both kidneys. No abnormal echogenicity or hydronephrosis.   Electronically Signed   By: Van Clines M.D.   On: 09/03/2015 15:50    ASSESSMENT: 44 y.o.  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  (0) genetic testing of the ATM, BARD1, BRCA1, BRCA2, BRIP1, CDH1, CHEK2, EPCAM, FANCC, MLH1, MSH2, MSH6, NBN, PALB2, PMS2, PTEN, RAD51C, RAD51D, STK11, TP53, and XRCC2 genes showed no deleterious mutations  (  1) status post left breast biopsy 04/23/2013 for a clinical T4 N1, stage IIIB invasive ductal carcinoma, grade 3,  estrogen receptor 14% positive with moderate staining intensity, progesterone receptor negative,  with an MIB-1 of 100% and HER-2/neu  nonamplified  (2) biopsy of a left axillary lymph node [phone] was positive  (3) started monthly goserelin July of 02/01/2013  (4)  received  neoadjuvant chemotherapy with doxorubicin and cyclophosphamide in dose dense fashion x4, followed by carboplatin and paclitaxel x1, discontinued due to neuropathy, followed by carboplatin and gemcitabine, both given day 1 of each 14 day cycle x6, completed 10/12/2013 .   (5) right upper extremity DVT documented 06/29/2013,  treated with Lovenox from July  through December 2014    (6) status post left modified radical mastectomy 11/15/2013 for a residual pT3 pN0 invasive ductal carcinoma, grade 3, with repeat prognostic panel triple negative. All 12 axillary lymph nodes were clear. Margins  were negative   (5) completed adjuvant radiation therapy 03/18/2014,  with capecitabine added 2-3 weeks into treatment.  METASTATIC DISEASE  (6) CT scans of the chest, abdomen, and pelvis 06/26/2014 showed a large right pleural effusion  with pleural studding, 2 left upper lobe nodules, and two  liver lesions. Cytology from right thoracentesis 07/03/2014 showed malignant cells consistent with the patient's known primary  (7) started eribulin 07/09/2014, given days 1 and 8 of each 21 day cycle; restagind studies after cycle 3 showed evidence of response. Discontinued after cycle 5 (last dose 10/08/14) because of worsening peripheral neuropathy--also with progression.   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 Antithrombin level was normal.  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 She understands we have found a small metastasis in her brain. She is scheduled for a better brain MRI and that might show perhaps 1 or 2 other small lesions. At this point she appears to be a very good  candidate for stereotactic radiotherapy and she is ready scheduled to meet with Dr. Isidore Moos and do the preliminary simulation October 5.  We discussed the difference between whole brain irradiation and stereotactic radiotherapy. She understands that there may be more brain lesions that show up in the future, and that we may be able to treat them the same way. She understands also that my chemotherapy generally does not cross the blood-brain barrier.  I think it would be prudent to hold off on the Xeloda for now. This way she can proceed to her radiation treatments without additional complications. It will also give this significant palmar or plantar erythrodysesthesia she is experiencing some time to heal. She is going to see me again October 21 and before that visit we will obtain a PET scan. That will help [place]  assess whether the Xeloda continues to control her tumor as before.  Quite aside from these issues she has significant congestive heart failure. We appreciate the help of cardiology and she is seeing them later this week. She also is still on Coumadin and I think it would be prudent for her to continue on that given the fact that she still has poorly controlled cancer and now also the congestive heart failure problem.  We discussed advanced directives today. She will very much wants to live to see her grandchildren go up, she says. She is not interested in a DO NOT RESUSCITATE at present. We went over healthcare part of attorney issues and I explained that her significant other is not automatically her healthcare part turning. She was given the appropriate papers to complete and we will discuss them again when she returns to see me October 21.  She knows to call for any problems that may develop before her next visit here.  [name], MD 09/09/2015 11:01 AM

## 2015-09-11 ENCOUNTER — Other Ambulatory Visit: Payer: Self-pay

## 2015-09-11 DIAGNOSIS — I82409 Acute embolism and thrombosis of unspecified deep veins of unspecified lower extremity: Secondary | ICD-10-CM

## 2015-09-11 NOTE — Progress Notes (Signed)
Called patient regarding INR from 09/09/15 3.9  Patient states that pharmacy had her hold her dose for one day then decrease her coumadin to 1 mg daily.  Patient to have repeat INR on 09/17/15.

## 2015-09-12 ENCOUNTER — Encounter (HOSPITAL_COMMUNITY): Payer: Self-pay

## 2015-09-12 ENCOUNTER — Ambulatory Visit (HOSPITAL_COMMUNITY)
Admit: 2015-09-12 | Discharge: 2015-09-12 | Disposition: A | Discharge status: Home / Self Care | Payer: No Typology Code available for payment source | Source: Ambulatory Visit | Attending: Cardiology | Admitting: Cardiology

## 2015-09-12 ENCOUNTER — Ambulatory Visit
Admission: RE | Admit: 2015-09-12 | Discharge: 2015-09-12 | Disposition: A | Discharge status: Home / Self Care | Payer: No Typology Code available for payment source | Source: Ambulatory Visit | Attending: Radiation Oncology | Admitting: Radiation Oncology

## 2015-09-12 VITALS — BP 108/62 | HR 107 | Wt 160.8 lb

## 2015-09-12 DIAGNOSIS — I5022 Chronic systolic (congestive) heart failure: Secondary | ICD-10-CM | POA: Diagnosis not present

## 2015-09-12 DIAGNOSIS — T451X5A Adverse effect of antineoplastic and immunosuppressive drugs, initial encounter: Secondary | ICD-10-CM

## 2015-09-12 DIAGNOSIS — Z833 Family history of diabetes mellitus: Secondary | ICD-10-CM | POA: Diagnosis not present

## 2015-09-12 DIAGNOSIS — I82401 Acute embolism and thrombosis of unspecified deep veins of right lower extremity: Secondary | ICD-10-CM | POA: Diagnosis not present

## 2015-09-12 DIAGNOSIS — R000 Tachycardia, unspecified: Secondary | ICD-10-CM | POA: Insufficient documentation

## 2015-09-12 DIAGNOSIS — Z853 Personal history of malignant neoplasm of breast: Secondary | ICD-10-CM | POA: Insufficient documentation

## 2015-09-12 DIAGNOSIS — Z79899 Other long term (current) drug therapy: Secondary | ICD-10-CM | POA: Diagnosis not present

## 2015-09-12 DIAGNOSIS — Z86718 Personal history of other venous thrombosis and embolism: Secondary | ICD-10-CM | POA: Diagnosis not present

## 2015-09-12 DIAGNOSIS — C7931 Secondary malignant neoplasm of brain: Secondary | ICD-10-CM | POA: Insufficient documentation

## 2015-09-12 DIAGNOSIS — Z7901 Long term (current) use of anticoagulants: Secondary | ICD-10-CM | POA: Insufficient documentation

## 2015-09-12 DIAGNOSIS — Z9221 Personal history of antineoplastic chemotherapy: Secondary | ICD-10-CM | POA: Diagnosis not present

## 2015-09-12 DIAGNOSIS — Z8249 Family history of ischemic heart disease and other diseases of the circulatory system: Secondary | ICD-10-CM | POA: Diagnosis not present

## 2015-09-12 DIAGNOSIS — I427 Cardiomyopathy due to drug and external agent: Secondary | ICD-10-CM | POA: Diagnosis not present

## 2015-09-12 MED ORDER — GADOBENATE DIMEGLUMINE 529 MG/ML IV SOLN
15.0000 mL | Freq: Once | INTRAVENOUS | Status: AC | PRN
  Administered 2015-09-12: 15 mL via INTRAVENOUS

## 2015-09-12 MED ORDER — FUROSEMIDE 20 MG PO TABS: 20.0000 mg | ORAL_TABLET | Freq: Every day | ORAL | Status: DC

## 2015-09-12 NOTE — Patient Instructions (Signed)
TAKE Lasix 20mg  ( 1 tablet) daily.  FOLLOW UP: 3-4 weeks with Dr.McLean

## 2015-09-12 NOTE — Progress Notes (Signed)
Advanced Heart Failure Medication Review by a Pharmacist  Does the patient  feel that his/her medications are working for him/her?  yes  Has the patient been experiencing any side effects to the medications prescribed?  no  Does the patient measure his/her own blood pressure or blood glucose at home?  no   Does the patient have any problems obtaining medications due to transportation or finances?   no  Understanding of regimen: good Understanding of indications: good Potential of compliance: good    Pharmacist comments:  Erika [name] is a pleasant 44 yo F presenting without a medication list but able to confirm her medications including dosages to me. She reports excellent compliance with all of her medications and had no specific medication-related questions or concerns for me at this time.   Erika [name], PharmD, BCPS, CPP Clinical Pharmacist Pager: [phone] Phone: [phone] 09/12/2015 11:03 AM

## 2015-09-12 NOTE — Progress Notes (Signed)
Patient ID: Erika [name], female   DOB: [birth year], 44 y.o.   MRN: [id number] Referring Physician: Barbee Shropshire NP Primary Care: Primary Cardiologist: None  Oncologist: Dr Jana Hakim  HPI: Erika [name] is a 44 year old with history of metastatic left breast cancer, ER 14%, PR negative, HER-2/neu negative , triple negative with a Ki-67 that approaches 100%, DVT RUE, iron deficiency anemia, S/P radiation chest wall 02/04/14 through 03/18/2014 referred to [facility] by Dr Jana Hakim due to reduced EF.   She returns for post hospital follow up. Last visit she was sent to the ED with N/V and hypotension and AKI. While hospitalized brain mets identified. She is to start brain radiation soon. Prior to d/c she was started on low dose carvedilol. Today she says she feels good but having increased dyspnea with exertion. Mild dizziness when stands up. Poor appetite. No N/V.  Weight at home 160-165 pounds.     -Received 4 cycles doxorubicin and cyclophosphamide in dose dense fashion x4, followed by carboplatin and paclitaxel x1, discontinued due to neuropathy, followed by carboplatin and gemcitabine, both given day 1 of each 14 day cycle x6, completed 10/12/2013 . -started cyclophosphamide, methotrexate, fluorouracil (CMF) 11/19/2014, repeated every 21 days.               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 Antithrombin level was normal. - to start liposomal doxorubicin (Doxil) 05/14/2015, canceled because of low ejection fraction - started capecitabine1.5g BID, 05/20/15, 1 week on, 1 week off. Stopped - To start radiation.   ECHO 9/72016 EF 20-25%  ECHO 05/21/2015  With contrast EF  25-25%  ECHO 05/16/2015-EF 25-30% RV normal Lat s' 8.3 GLS -14% ECHO 05/2013 EF 55%  Lat  s' 9.0 No GLS   Labs 06/24/2015: K 3.7 Creatinine 1.1  Labs 08/26/2015: K 4.8 Creatinine 2.1  Labs 09/09/15: K 5.3 Creatinine 1.0    Past Medical History  Diagnosis Date  . Headache(784.0)     Hx: of Migraines  . Anxiety     ANXIOUS ABOUT HAVEING BIG SURGERY - MASTECTOMY  . 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 DVT (deep venous thrombosis)     RT ARM-PT ON LOVENOX  . S/P radiation therapy 02-04-14 to 03-18-14                               1) Left Chest Wall / 50 Gy in 25 fractions/ 2) Left Supraclavicular fossa/ 46 Gy in 23 fractions / 3) Left Posterior Axillary boost / 8.533 Gy in 23 fractions/ 4) Left Chest Wall Scar boost / 10 Gy in 5 fractions  . CHF (congestive heart failure)   . Iron deficiency anemia 11/26/13  . Cancer of left breast     Invasive High Grade Carcinoma; S/P mastectomy, Adriamycin and Cytoxan, radiation  . Metastases to the liver   . Metastasis to lung     Current Outpatient Prescriptions  Medication Sig Dispense Refill  . capecitabine (XELODA) 500 MG tablet Take 1,500 mg by mouth 2 (two) times daily after a meal. Rotate for a week on then a week off    . carvedilol (COREG) 3.125 MG tablet Take 1 tablet (3.125  mg total) by mouth 2 (two) times daily with a meal. 60 tablet 0  . cyclobenzaprine (FLEXERIL) 5 MG tablet TAKE 1 TABLET BY MOUTH THREE TIMES A DAY FOR MUSCLE SPASMS 30 tablet 1  . dexamethasone (DECADRON) 4 MG tablet Take 1 tablet (4 mg total) by mouth 2 (two) times daily. 8 tablet 0  . fentaNYL (DURAGESIC - DOSED MCG/HR) 25 MCG/HR patch Place 1 patch (25 mcg total) onto the skin every 3 (three) days. 10 patch 0  . fentaNYL (DURAGESIC - DOSED MCG/HR) 75 MCG/HR Place 1 patch (75 mcg total) onto the skin every 3 (three) days. 10 patch 0  . gabapentin (NEURONTIN) 300 MG capsule Take 1 capsule (300 mg total) by mouth 3 (three) times daily. 90 capsule 2  . loratadine  (CLARITIN) 10 MG tablet Take 10 mg by mouth daily as needed for allergies.    Marland Kitchen LORazepam (ATIVAN) 0.5 MG tablet Take 1 tablet (0.5 mg total) by mouth every 6 (six) hours as needed for anxiety. 30 tablet 0  . ondansetron (ZOFRAN) 8 MG tablet Take 1 tablet (8 mg total) by mouth 2 (two) times daily. 20 tablet 1  . oxyCODONE (OXY IR/ROXICODONE) 5 MG immediate release tablet Take 1-2 tablets every 8 hrs as needed for severe or breakthrough pain 180 tablet 0  . potassium chloride SA (K-DUR,KLOR-CON) 20 MEQ tablet Take 1 tablet (20 mEq total) by mouth 4 (four) times daily. 120 tablet 1  . prochlorperazine (COMPAZINE) 10 MG tablet Take 1 tablet (10 mg total) by mouth every 8 (eight) hours as needed for nausea or vomiting. 30 tablet 0  . warfarin (COUMADIN) 2 MG tablet Take 1 mg by mouth daily.    Marland Kitchen zolpidem (AMBIEN) 5 MG tablet Take 1 tablet (5 mg total) by mouth at bedtime as needed for sleep. 30 tablet 0   No current facility-administered medications for this encounter.   Facility-Administered Medications Ordered in Other Encounters  Medication Dose Route Frequency Provider Last Rate Last Dose  . Darbepoetin Alfa (ARANESP) injection 200 mcg  200 mcg Subcutaneous Once [name], NP      . sodium chloride 0.9 % injection 10 mL  10 mL Intravenous PRN [name], MD   10 mL at 06/19/15 1330  . topical emolient (BIAFINE) emulsion   Topical Daily [name], MD        Allergies  Allergen Reactions  . Doxycycline Nausea And Vomiting      Social History   Social History  . Marital Status: Single    Spouse Name: N/A  . Number of Children: 3  . Years of Education: N/A   Occupational History  .     Social History Main Topics  . Smoking status: Never Smoker   . Smokeless tobacco: Never Used  . Alcohol Use: No  . Drug Use: No  . Sexual Activity: Not Currently   Other Topics Concern  . Not on file   Social History Narrative      Family History  Problem Relation Age of  Onset  . Hypertension Mother   . Diabetes Father 75  . Stomach cancer Maternal Aunt   . Hypertension Maternal Grandmother   . Hypertension Maternal Grandfather   . Diabetes Paternal Grandmother   . Diabetes Paternal Grandfather   . Diabetes Paternal Uncle   . Diabetes Paternal Uncle   . Diabetes Paternal Uncle     Filed Vitals:   09/12/15 1045  BP: 108/62  Pulse: 107  Weight: 160 lb 12 oz (72.916 kg)  SpO2: 100%    PHYSICAL EXAM: General:  Chronically ill  appearing. No respiratory difficulty HEENT: normal Neck: supple. JVD 8-9. Carotids 2+ bilat; no bruits. No lymphadenopathy or thryomegaly appreciated. Cor: PMI nondisplaced. Regular rate & rhythm. No rubs, gallops or murmurs. R upper chest porta cath.  Lungs: clear Decreased breath sounds on the right.  Abdomen: soft, nontender, nondistended. No hepatosplenomegaly. No bruits or masses. Good bowel sounds. Extremities: no cyanosis, clubbing, rash, edema Neuro: alert & oriented x 3, cranial nerves grossly intact. moves all 4 extremities w/o difficulty. Affect pleasant.  EKG: Sinus Tach  107  Bpm.   ASSESSMENT & PLAN:  1. Chronic Systolic Heart Failure. Likely related to prior doxorubicin. Has not had ischemic work up. No chest pain. If occurs will need stress test. Newly reduced EF from 55% in 2014 to 25-30% in June 2016 and again this month.  NYHA III. Volume status mildly elevated. Add 20 mg lasix daily.   - Continue low dose carvedilol 3.125 mg twice a day    No lisinopril with elevated potassium.  2. Metastatic breast cancer: Per Dr Jana Hakim. She will start radiation soon.   3. RUE DVT: on coumadin.    Follow up in 3-4 weeks.   CLEGG,AMY,NP-C 11:18 AM

## 2015-09-16 ENCOUNTER — Telehealth: Payer: Self-pay

## 2015-09-16 NOTE — Telephone Encounter (Signed)
Spoke with patient reminding her that she will no longer be managed for her INR's  in the coumadin clinic.  She stated understanding.  At this time patient is taking 1 mg of coumadin daily and will have a repeat lab tomorrow at 11:00 after her radiation appt.

## 2015-09-16 NOTE — Progress Notes (Signed)
Location/Histology of Brain Tumor:11 X 11 Right occipital lesion, 4 mm enhancing subcortical lesion, and a  9 mm enhancing lesion along the left central sulcus likely within the anterior bank of the postcentral gyrus lesion in the medial left parietal lobe   Patient presented with symptoms of:  Persistent nausea, headache  Past or anticipated interventions, if any, per neurosurgery: none  Past or anticipated interventions, if any, per medical oncology: Per Dr. Virgie Dad note from 09/09/15  they are holding off on her Xeloda for now. They will see her October 21st and she will have a PET scan before that appointment.  Dose of Decadron, if applicable: Decadron 4 mg twice a day  Recent neurologic symptoms, if any:   Seizures: no  Headaches: no  Nausea: no  Dizziness/ataxia: yes  Difficulty with hand coordination: yes  Focal numbness/weakness: in finger tips and toes  Visual deficits/changes: no  Confusion/Memory deficits: when she first gets up in morning  Painful bone metastases at present, if any: no  SAFETY ISSUES:  Prior radiation?  TREATMENT DATES: 02-04-14 to 03-18-14  Site/dose:  1) Left Chest Wall / 50 Gy in 25 fractions  2) Left Supraclavicular fossa/ 46 Gy in 23 fractions  3) Left Posterior Axillary boost / 8.533 Gy in 23 fractions  4) Left Chest Wall Scar boost / 10 Gy in 5 fractions   Pacemaker/ICD? no  Possible current pregnancy? no  Is the patient on methotrexate? no  Additional Complaints / other details: none at this time. Ms. General fell yesterday walking into a store. She reports an abraision on her knee, and states she is sore all over. She also appears to be very lethargic, and her BP is low at 83/55 this morning.

## 2015-09-17 ENCOUNTER — Ambulatory Visit: Payer: No Typology Code available for payment source

## 2015-09-17 ENCOUNTER — Other Ambulatory Visit: Payer: Self-pay | Admitting: Oncology

## 2015-09-17 ENCOUNTER — Encounter: Payer: Self-pay | Admitting: Oncology

## 2015-09-17 ENCOUNTER — Ambulatory Visit
Admit: 2015-09-17 | Discharge: 2015-09-17 | Disposition: A | Discharge status: Home / Self Care | Payer: PRIVATE HEALTH INSURANCE | Attending: Radiation Oncology | Admitting: Radiation Oncology

## 2015-09-17 ENCOUNTER — Ambulatory Visit
Admit: 2015-09-17 | Discharge: 2015-09-17 | Disposition: A | Discharge status: Home / Self Care | Payer: No Typology Code available for payment source | Attending: Radiation Oncology | Admitting: Radiation Oncology

## 2015-09-17 ENCOUNTER — Ambulatory Visit
Admission: RE | Admit: 2015-09-17 | Discharge: 2015-09-17 | Disposition: A | Discharge status: Home / Self Care | Payer: No Typology Code available for payment source | Source: Ambulatory Visit | Attending: Radiation Oncology | Admitting: Radiation Oncology

## 2015-09-17 ENCOUNTER — Other Ambulatory Visit (HOSPITAL_BASED_OUTPATIENT_CLINIC_OR_DEPARTMENT_OTHER): Payer: No Typology Code available for payment source

## 2015-09-17 ENCOUNTER — Ambulatory Visit (HOSPITAL_BASED_OUTPATIENT_CLINIC_OR_DEPARTMENT_OTHER): Payer: No Typology Code available for payment source | Admitting: Nurse Practitioner

## 2015-09-17 ENCOUNTER — Telehealth: Payer: Self-pay

## 2015-09-17 ENCOUNTER — Other Ambulatory Visit: Payer: Self-pay | Admitting: Nurse Practitioner

## 2015-09-17 ENCOUNTER — Encounter: Payer: Self-pay | Admitting: Nurse Practitioner

## 2015-09-17 ENCOUNTER — Telehealth: Payer: Self-pay | Admitting: Oncology

## 2015-09-17 ENCOUNTER — Telehealth: Payer: Self-pay | Admitting: *Deleted

## 2015-09-17 VITALS — BP 84/49 | HR 117 | Temp 98.9°F | Ht 65.0 in | Wt 160.8 lb

## 2015-09-17 VITALS — BP 96/63 | HR 107 | Temp 98.1°F | Resp 18 | Ht 65.0 in | Wt 162.2 lb

## 2015-09-17 DIAGNOSIS — I82409 Acute embolism and thrombosis of unspecified deep veins of unspecified lower extremity: Secondary | ICD-10-CM

## 2015-09-17 DIAGNOSIS — Z510 Encounter for antineoplastic radiation therapy: Secondary | ICD-10-CM | POA: Diagnosis not present

## 2015-09-17 DIAGNOSIS — Z853 Personal history of malignant neoplasm of breast: Secondary | ICD-10-CM | POA: Diagnosis not present

## 2015-09-17 DIAGNOSIS — Z79899 Other long term (current) drug therapy: Secondary | ICD-10-CM | POA: Insufficient documentation

## 2015-09-17 DIAGNOSIS — I823 Embolism and thrombosis of renal vein: Secondary | ICD-10-CM

## 2015-09-17 DIAGNOSIS — C7931 Secondary malignant neoplasm of brain: Secondary | ICD-10-CM | POA: Insufficient documentation

## 2015-09-17 DIAGNOSIS — G629 Polyneuropathy, unspecified: Secondary | ICD-10-CM | POA: Diagnosis not present

## 2015-09-17 DIAGNOSIS — C50212 Malignant neoplasm of upper-inner quadrant of left female breast: Secondary | ICD-10-CM | POA: Diagnosis not present

## 2015-09-17 DIAGNOSIS — N289 Disorder of kidney and ureter, unspecified: Secondary | ICD-10-CM

## 2015-09-17 DIAGNOSIS — B379 Candidiasis, unspecified: Secondary | ICD-10-CM | POA: Insufficient documentation

## 2015-09-17 DIAGNOSIS — Z923 Personal history of irradiation: Secondary | ICD-10-CM | POA: Diagnosis not present

## 2015-09-17 DIAGNOSIS — R5381 Other malaise: Secondary | ICD-10-CM | POA: Diagnosis not present

## 2015-09-17 DIAGNOSIS — C787 Secondary malignant neoplasm of liver and intrahepatic bile duct: Secondary | ICD-10-CM | POA: Diagnosis not present

## 2015-09-17 DIAGNOSIS — I959 Hypotension, unspecified: Secondary | ICD-10-CM

## 2015-09-17 DIAGNOSIS — C50912 Malignant neoplasm of unspecified site of left female breast: Secondary | ICD-10-CM

## 2015-09-17 DIAGNOSIS — E860 Dehydration: Secondary | ICD-10-CM

## 2015-09-17 DIAGNOSIS — E8809 Other disorders of plasma-protein metabolism, not elsewhere classified: Secondary | ICD-10-CM | POA: Insufficient documentation

## 2015-09-17 DIAGNOSIS — G893 Neoplasm related pain (acute) (chronic): Secondary | ICD-10-CM

## 2015-09-17 DIAGNOSIS — D649 Anemia, unspecified: Secondary | ICD-10-CM | POA: Insufficient documentation

## 2015-09-17 DIAGNOSIS — R51 Headache: Secondary | ICD-10-CM | POA: Insufficient documentation

## 2015-09-17 DIAGNOSIS — Z7901 Long term (current) use of anticoagulants: Secondary | ICD-10-CM

## 2015-09-17 LAB — CBC WITH DIFFERENTIAL/PLATELET
BASO%: 0.6 % (ref 0.0–2.0)
BASOS ABS: 0.1 10*3/uL (ref 0.0–0.1)
EOS ABS: 0.1 10*3/uL (ref 0.0–0.5)
EOS%: 0.5 % (ref 0.0–7.0)
HEMATOCRIT: 27.5 % — AB (ref 34.8–46.6)
HEMOGLOBIN: 8.7 g/dL — AB (ref 11.6–15.9)
LYMPH#: 0.9 10*3/uL (ref 0.9–3.3)
LYMPH%: 5.7 % — ABNORMAL LOW (ref 14.0–49.7)
MCH: 27.5 pg (ref 25.1–34.0)
MCHC: 31.6 g/dL (ref 31.5–36.0)
MCV: 87 fL (ref 79.5–101.0)
MONO#: 1 10*3/uL — AB (ref 0.1–0.9)
MONO%: 6.6 % (ref 0.0–14.0)
NEUT#: 12.9 10*3/uL — ABNORMAL HIGH (ref 1.5–6.5)
NEUT%: 86.6 % — ABNORMAL HIGH (ref 38.4–76.8)
PLATELETS: 409 10*3/uL — AB (ref 145–400)
RBC: 3.16 10*6/uL — ABNORMAL LOW (ref 3.70–5.45)
RDW: 23.3 % — AB (ref 11.2–14.5)
WBC: 14.9 10*3/uL — ABNORMAL HIGH (ref 3.9–10.3)

## 2015-09-17 LAB — PROTIME-INR
INR: 1.3 — ABNORMAL LOW (ref 2.00–3.50)
Protime: 15.6 Seconds — ABNORMAL HIGH (ref 10.6–13.4)

## 2015-09-17 LAB — COMPREHENSIVE METABOLIC PANEL (CC13)
ALBUMIN: 1.8 g/dL — AB (ref 3.5–5.0)
ALK PHOS: 198 U/L — AB (ref 40–150)
ALT: 21 U/L (ref 0–55)
ANION GAP: 8 meq/L (ref 3–11)
AST: 44 U/L — AB (ref 5–34)
BUN: 29.3 mg/dL — AB (ref 7.0–26.0)
CALCIUM: 9.4 mg/dL (ref 8.4–10.4)
CO2: 26 mEq/L (ref 22–29)
Chloride: 100 mEq/L (ref 98–109)
Creatinine: 1.2 mg/dL — ABNORMAL HIGH (ref 0.6–1.1)
EGFR: 61 mL/min/{1.73_m2} — AB (ref >90)
Glucose: 89 mg/dl (ref 70–140)
POTASSIUM: 3.7 meq/L (ref 3.5–5.1)
Sodium: 134 mEq/L — ABNORMAL LOW (ref 136–145)
Total Bilirubin: 0.83 mg/dL (ref 0.20–1.20)
Total Protein: 7.3 g/dL (ref 6.4–8.3)

## 2015-09-17 MED ORDER — SODIUM CHLORIDE 0.9 % IV SOLN
INTRAVENOUS | Status: AC
  Administered 2015-09-17: 13:00:00 via INTRAVENOUS

## 2015-09-17 NOTE — Progress Notes (Signed)
Erika [name] arrived for reconsult for simualtion to her brain tumor. She presented with a low blood pressure of 80/50 and heart rate of 128 sitting. Her BP was 84/49 and heart rate 117 while standing. Dr. Isidore Moos saw patient and would like her to go see Selena Lesser RN to help with her low blood pressure after her simulation today. The patient is agreeable to the plan.

## 2015-09-17 NOTE — Assessment & Plan Note (Signed)
Abumin decreased from 2.3 down to 1.8.  Pt encouraged to push protien in her diet as much as possible.

## 2015-09-17 NOTE — Assessment & Plan Note (Addendum)
Patient discontinued Xeloda several weeks ago; but slightly more anemic today with a hemoglobin down to 8.7.  Patient is complaining of some increased fatigue/weakness; but denies any shortness of breath with exertion. Will continue to monitor closely.

## 2015-09-17 NOTE — Progress Notes (Signed)
  Radiation Oncology         [phone] ________________________________  Name: Erika [name] MRN: [id number]  Date: 09/17/2015  DOB: 1971-08-21  SIMULATION AND TREATMENT PLANNING NOTE; SPECIAL TREATMENT PROCEDURE NOTE:   DIAGNOSIS:    ICD-9-CM ICD-10-CM   1. Brain metastases (HCC) 198.3 C79.31    Breast cancer with brain metastases  NARRATIVE:  The patient was brought to the [facility].  Identity was confirmed.  All relevant records and images related to the planned course of therapy were reviewed.  The patient freely provided informed written consent to proceed with treatment after reviewing the details related to the planned course of therapy. The consent form was witnessed and verified by the simulation staff. Intravenous access was established for contrast administration. Then, the patient was set-up in a stable reproducible supine position for radiation therapy.  A relocatable thermoplastic stereotactic head frame was fabricated for precise immobilization.  CT images were obtained.  Surface markings were placed.  The CT images were loaded into the planning software and fused with the patient's targeting MRI scan.  Then the target and avoidance structures were contoured.  Treatment planning then occurred.  The radiation prescription was entered and confirmed.  I have requested 3D planning  I have requested a DVH of the following structures: Brain stem, brain, left eye, right eye, lenses, optic chiasm, target volumes, uninvolved brain, and normal tissue.    PLAN:  The patient will receive 20 Gy in 1 fraction to her three sites of brain disease. Stereotactic radiosurgery will be used.  SPECIAL TREATMENT PROCEDURE NOTE:  This constitutes a special treatment procedure due to the ablative dose that will be delivered and the technical nature of treatment.  This highly technical modality of treatment ensures that the ablative dose is centered on the patient's tumor while sparing  normal tissues from excessive dose and risk of detrimental effects.   This document serves as a record of services personally performed by [name], MD. It was created on her behalf by Arlyce Harman, a trained medical scribe. The creation of this record is based on the scribe's personal observations and the provider's statements to them. This document has been checked and approved by the attending provider. -----------------------------------  [name], MD

## 2015-09-17 NOTE — Progress Notes (Signed)
SYMPTOM MANAGEMENT CLINIC   HPI: Erika [name] 44 y.o. female diagnosed with breast cancer; with brain metastasis.  Patient is status post Xeloda oral therapy.  Most recently.  Scheduled to undergo stereotactic radiotherapy for her brain metastasis on 09/22/2015.  While in radiation oncology department earlier this morning.-Pt was found to have low blood pressure of 80/50; with increased weakness and dehydration.  Patient reports that she continues to take Coreg and Lasix as directed.  Patient states that she has had minimal appetite and poor oral intake recently.  HPI  ROS  Past Medical History  Diagnosis Date  . Headache(784.0)     Hx: of Migraines  . Anxiety     ANXIOUS ABOUT HAVEING BIG SURGERY - MASTECTOMY  . 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 DVT (deep venous thrombosis) (HCC)     RT ARM-PT ON LOVENOX  . S/P radiation therapy 02-04-14 to 03-18-14                               1) Left Chest Wall / 50 Gy in 25 fractions/ 2) Left Supraclavicular fossa/ 46 Gy in 23 fractions / 3) Left Posterior Axillary boost / 8.533 Gy in 23 fractions/ 4) Left Chest Wall Scar boost / 10 Gy in 5 fractions  . CHF (congestive heart failure) ([place])   . Iron deficiency anemia 11/26/13  . Cancer of left breast ([place])     Invasive High Grade Carcinoma; S/P mastectomy, Adriamycin and Cytoxan, radiation  . Metastases to the liver ([place])   . Metastasis to lung [facility])     Past Surgical History  Procedure Laterality Date  . Portacath placement Right 05/22/2013    Procedure: INSERTION PORT-A-CATH;  Surgeon: [name], MD;  Location: [facility] OR;  Service: General;  Laterality: Right;  . Breast surgery Left 05/21/13; 06/22/13     Needle Core Biopsy  . Mastectomy modified radical Left 11/15/2013    Procedure: LEFT MASTECTOMY MODIFIED RADICAL;  Surgeon: [name], MD;  Location: [facility] ORS;  Service: General;  Laterality: Left;  . Tubal ligation  08/1996    has DVT (deep venous thrombosis)  ([place]); S/P mastectomy; Breast cancer of upper-inner quadrant of left female breast ([place]); Hypokalemia; Neuropathy due to chemotherapeutic drug ([place]); Neoplasm related pain; Hand foot syndrome; Anemia in neoplastic disease; Thrombocytosis ([place]); URI (upper respiratory infection); Long term current use of anticoagulant therapy; Renal vein thrombosis ([place]); Hypotension; Sepsis ([place]); UTI (lower urinary tract infection); Hematuria; Supratherapeutic INR; Pancytopenia due to antineoplastic chemotherapy ([place]); Malnutrition of moderate degree ([place]); 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    is allergic to doxycycline.    Medication List       This list is accurate as of: 09/17/15  2:04 PM.  Always use your most recent med list.               carvedilol 3.125 MG tablet  Commonly known as:  COREG  Take 1 tablet (3.125 mg total) by mouth 2 (two) times daily with a meal.     cyclobenzaprine 5 MG tablet  Commonly known as:  FLEXERIL  TAKE 1 TABLET BY MOUTH THREE TIMES A DAY FOR MUSCLE SPASMS     fentaNYL 25 MCG/HR patch  Commonly known as:  [place] - dosed mcg/hr  Place 1 patch (25 mcg total) onto the skin every 3 (three) days.     fentaNYL 75 MCG/HR  Commonly known as:  DURAGESIC - dosed mcg/hr  Place 1 patch (75 mcg total) onto the skin every 3 (three) days.     furosemide 20 MG tablet  Commonly known as:  LASIX  Take 1 tablet (20 mg total) by mouth daily.     gabapentin 300 MG capsule  Commonly known as:  NEURONTIN  Take 1 capsule (300 mg  total) by mouth 3 (three) times daily.     loratadine 10 MG tablet  Commonly known as:  CLARITIN  Take 10 mg by mouth daily as needed for allergies.     LORazepam 0.5 MG tablet  Commonly known as:  ATIVAN  Take 1 tablet (0.5 mg total) by mouth every 6 (six) hours as needed for anxiety.     ondansetron 8 MG tablet  Commonly known as:  ZOFRAN  Take 1 tablet (8 mg total) by mouth 2 (two) times daily.     oxyCODONE 5 MG immediate release tablet  Commonly known as:  Oxy IR/ROXICODONE  Take 1-2 tablets every 8 hrs as needed for severe or breakthrough pain     potassium chloride SA 20 MEQ tablet  Commonly known as:  K-DUR,KLOR-CON  Take 1 tablet (20 mEq total) by mouth 4 (four) times daily.     prochlorperazine 10 MG tablet  Commonly known as:  COMPAZINE  Take 1 tablet (10 mg total) by mouth every 8 (eight) hours as needed for nausea or vomiting.     warfarin 2 MG tablet  Commonly known as:  COUMADIN  Take 1 mg by mouth daily.     zolpidem 5 MG tablet  Commonly known as:  AMBIEN  Take 1 tablet (5 mg total) by mouth at bedtime as needed for sleep.         PHYSICAL EXAMINATION  Oncology Vitals 09/17/2015 09/17/2015 09/17/2015 09/17/2015 09/12/2015 09/12/2015 09/09/2015  Height 165 cm - - 165 cm - - 166 cm  Weight 73.573 kg - - 72.938 kg 72.576 kg 72.916 kg 74.163 kg  Weight (lbs) 162 lbs 3 oz - - 160 lbs 13 oz 160 lbs 160 lbs 12 oz 163 lbs 8 oz  BMI (kg/m2) 26.99 kg/m2 - - 26.76 kg/m2 - - 26.79 kg/m2  Temp 98.7 - - 98.9 - - 97.9  Pulse 109 117 128 113 - 107 85  Resp 19 - - - - - 18  SpO2 100 - - - - 100 97  BSA (m2) 1.84 m2 - - 1.83 m2 - - 1.85 m2   BP Readings from Last 3 Encounters:  09/17/15 95/60  09/17/15 84/49  09/12/15 108/62    Physical Exam  Constitutional: She is oriented to person, place, and time. She appears dehydrated. She appears unhealthy.  HENT:  Head: Normocephalic and atraumatic.  Mouth/Throat: Oropharynx is clear and moist.  Eyes: Conjunctivae and EOM  are normal. Pupils are equal, round, and reactive to light. Right eye exhibits no discharge. Left eye exhibits no discharge. No scleral icterus.  Neck: Normal range of motion. Neck supple. No JVD present. No tracheal deviation present. No thyromegaly present.  Cardiovascular: Normal rate, regular rhythm, normal heart sounds and intact distal pulses.   Pulmonary/Chest: Effort normal and  breath sounds normal. No respiratory distress. She has no wheezes. She has no rales. She exhibits no tenderness.  Abdominal: Soft. Bowel sounds are normal. She exhibits no distension and no mass. There is no tenderness. There is no rebound and no guarding.  Musculoskeletal: Normal range of motion. She exhibits no edema or tenderness.  Lymphadenopathy:    She has no cervical adenopathy.  Neurological: She is alert and oriented to person, place, and time.  Skin: Skin is warm and dry. No rash noted. No erythema. No pallor.  Psychiatric: Affect normal.  Nursing note and vitals reviewed.   LABORATORY DATA:. Appointment on 09/17/2015  Component Date Value Ref Range Status  . Protime 09/17/2015 15.6* 10.6 - 13.4 Seconds Final  . INR 09/17/2015 1.30* 2.00 - 3.50 Final   Comment: INR is useful only to assess adequacy of anticoagulation with coumadin when comparing results from different labs. It should not be used to estimate bleeding risk or presence/abscense of coagulopathy in patients not on coumadin. Expected INR ranges for  nontherapeutic patients is 0.88 - 1.12.   Marland Kitchen Lovenox 09/17/2015 No   Final  . WBC 09/17/2015 14.9* 3.9 - 10.3 10e3/uL Final  . NEUT# 09/17/2015 12.9* 1.5 - 6.5 10e3/uL Final  . HGB 09/17/2015 8.7* 11.6 - 15.9 g/dL Final  . HCT 09/17/2015 27.5* 34.8 - 46.6 % Final  . Platelets 09/17/2015 409* 145 - 400 10e3/uL Final  . MCV 09/17/2015 87.0  79.5 - 101.0 fL Final  . MCH 09/17/2015 27.5  25.1 - 34.0 pg Final  . MCHC 09/17/2015 31.6  31.5 - 36.0 g/dL Final  . RBC 09/17/2015 3.16* 3.70 - 5.45  10e6/uL Final  . RDW 09/17/2015 23.3* 11.2 - 14.5 % Final  . lymph# 09/17/2015 0.9  0.9 - 3.3 10e3/uL Final  . MONO# 09/17/2015 1.0* 0.1 - 0.9 10e3/uL Final  . Eosinophils Absolute 09/17/2015 0.1  0.0 - 0.5 10e3/uL Final  . Basophils Absolute 09/17/2015 0.1  0.0 - 0.1 10e3/uL Final  . NEUT% 09/17/2015 86.6* 38.4 - 76.8 % Final  . LYMPH% 09/17/2015 5.7* 14.0 - 49.7 % Final  . MONO% 09/17/2015 6.6  0.0 - 14.0 % Final  . EOS% 09/17/2015 0.5  0.0 - 7.0 % Final  . BASO% 09/17/2015 0.6  0.0 - 2.0 % Final  . Sodium 09/17/2015 134* 136 - 145 mEq/L Final  . Potassium 09/17/2015 3.7  3.5 - 5.1 mEq/L Final  . Chloride 09/17/2015 100  98 - 109 mEq/L Final  . CO2 09/17/2015 26  22 - 29 mEq/L Final  . Glucose 09/17/2015 89  70 - 140 mg/dl Final   Glucose reference range is for nonfasting patients. Fasting glucose reference range is 70- 100.  Marland Kitchen BUN 09/17/2015 29.3* 7.0 - 26.0 mg/dL Final  . Creatinine 09/17/2015 1.2* 0.6 - 1.1 mg/dL Final  . Total Bilirubin 09/17/2015 0.83  0.20 - 1.20 mg/dL Final  . Alkaline Phosphatase 09/17/2015 198* 40 - 150 U/L Final  . AST 09/17/2015 44* 5 - 34 U/L Final  . ALT 09/17/2015 21  0 - 55 U/L Final  . Total Protein 09/17/2015 7.3  6.4 - 8.3 g/dL Final  . Albumin 09/17/2015 1.8* 3.5 - 5.0 g/dL Final  . Calcium 09/17/2015 9.4  8.4 - 10.4 mg/dL Final  . Anion Gap 09/17/2015 8  3 - 11 mEq/L Final  . EGFR 09/17/2015 61* >90 ml/min/1.73 m2 Final   eGFR is calculated using the CKD-EPI Creatinine Equation (2009)     RADIOGRAPHIC STUDIES: No  results found.  ASSESSMENT/PLAN:    Breast cancer of upper-inner quadrant of left female breast Patient discontinued Xeloda oral therapy recently due to progression.  Patient underwent CT Sim earlier this morning for planned brain radiation-which is scheduled for Monday, 09/22/2015.  While in radiation oncology department earlier this morning.-Pt was found to have low blood pressure of 80/50; with increased weakness and  dehydration.  Patient reports that she continues to take Coreg and Lasix as directed.  Patient will receive IV fluid rehydration while the cancer Center today.  Will also review all findings with the patient's cardiologist in regards to possible changes of her medication regimen.  Blood counts obtained today reveal a WBC of 14.9, ANC 12.9, hemoglobin 8.7, and platelet count 409.  Patient is scheduled to receive her 1 time brain irradiation on Monday, 09/22/2015.  Patient is scheduled for labs and a restaging PET scan on 09/30/2015.  Patient scheduled for her next follow-up visit on 10/03/2015.    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  Hypotension Patient discontinued Xeloda oral therapy recently due to progression.  Patient underwent CT Sim earlier this morning for planned brain radiation-which is scheduled for Monday, 09/22/2015.  While in radiation oncology department earlier this morning.-Pt was found to have low blood pressure of 80/50; with increased weakness and dehydration.  Patient reports that she continues to take Coreg and Lasix as directed.  On exam- pt appears fatigued/weak; but nontoxic.  Patient denies any nausea, vomiting, diarrhea, or constipation.  Patient also denies any URI or UTI symptoms.  Patient was afebrile on exam today with temperature 98.7.    Patient will receive IV fluid rehydration while the cancer Center today.  Will also review all findings with the patient's cardiologist in regards to possible changes of her medication regimen.  Blood counts obtained today reveal a WBC of 14.9, ANC 12.9, hemoglobin 8.7, and platelet count 409.  Patient is scheduled to receive her 1 time brain irradiation on Monday, 09/22/2015.  Patient is scheduled for labs and a restaging PET scan on 09/30/2015.  Patient scheduled for her next follow-up visit on  10/03/2015.  Dehydration While in radiation oncology department earlier this morning.-Pt was found to have low blood pressure of 80/50; with increased weakness and dehydration.  Patient reports that she continues to take Coreg and Lasix as directed.  Patient states that she has had minimal appetite and poor oral intake recently.  On exam- pt appears fatigued/weak; but nontoxic.  Patient denies any nausea, vomiting, diarrhea, or constipation.  Patient also denies any URI or UTI symptoms.  Patient's creatinine is elevated to 1.2 today; the patient has a history of some chronic renal insufficiency.  Also, elevated creatinine is most likely secondary to dehydration as well.  Patient was afebrile on exam today with temperature 98.7.    Patient will receive IV fluid rehydration while the cancer Center today.  Patient has a history of chronic CHF; with a low ejection fracture between 20 and 30% per latest echo results.  Will also review all findings with the patient's cardiologist in regards to possible changes of her medication regimen.  Blood counts obtained today reveal a WBC of 14.9, ANC 12.9, hemoglobin 8.7, and platelet count 409.  Patient is scheduled to receive her 1 time brain irradiation on Monday, 09/22/2015.  Patient is scheduled for labs and a restaging PET scan on 09/30/2015.  Patient scheduled  for her next follow-up visit on 10/03/2015.  Renal insufficiency Creatinine has increased to 1.2 today.  Most likely, this is secondary to dehydration-the patient also has a history of some chronic renal insufficiency as well.  Will continue to monitor closely.  Anemia Patient discontinued Xeloda several weeks ago; but slightly more anemic today with a hemoglobin down to 8.7.  Patient is complaining of some increased fatigue/weakness; but denies any shortness of breath with exertion. Will continue to monitor closely.  Hypoalbuminemia Abumin decreased from 2.3 down to 1.8.  Pt encouraged to  push protien in her diet as much as possible.     Update after receiving IV fluid rehydration: Blood pressure after IV fluid rehydration was improved to 96/63.  Pt stated that she felt much better.    Also, spoke at length with Darrick Grinder, cardiology NP that has recently cared for patient. She advised that pt discontinue both the Coreg and the Lasix.  She stated that she would have her office call pt to arrange for a follow up appt for next week.   Reviewed this new plan with both pt and her family. Updated med rec.  Patient stated understanding of all instructions; and was in agreement with this plan of care. The patient knows to call the clinic with any problems, questions or concerns.   Review/collaboration with Dr. Jana Hakim and Dr. Isidore Moos regarding all aspects of patient's visit today.   Total time spent with patient was 40 minutes;  with greater than 75 percent of that time spent in face to face counseling regarding patient's symptoms,  and coordination of care and follow up.  Disclaimer:This dictation was prepared with Dragon/digital dictation along with [employer]. Any transcriptional errors that result from this process are unintentional.  Drue Second, NP 09/17/2015

## 2015-09-17 NOTE — Assessment & Plan Note (Signed)
Patient discontinued Xeloda oral therapy recently due to progression.  Patient underwent CT Sim earlier this morning for planned brain radiation-which is scheduled for Monday, 09/22/2015.  While in radiation oncology department earlier this morning.-Pt was found to have low blood pressure of 80/50; with increased weakness and dehydration.  Patient reports that she continues to take Coreg and Lasix as directed.  Patient will receive IV fluid rehydration while the cancer Center today.  Will also review all findings with the patient's cardiologist in regards to possible changes of her medication regimen.  Blood counts obtained today reveal a WBC of 14.9, ANC 12.9, hemoglobin 8.7, and platelet count 409.  Patient is scheduled to receive her 1 time brain irradiation on Monday, 09/22/2015.  Patient is scheduled for labs and a restaging PET scan on 09/30/2015.  Patient scheduled for her next follow-up visit on 10/03/2015.

## 2015-09-17 NOTE — Assessment & Plan Note (Signed)
Patient discontinued Xeloda oral therapy recently due to progression.  Patient underwent CT Sim earlier this morning for planned brain radiation-which is scheduled for Monday, 09/22/2015.  While in radiation oncology department earlier this morning.-Pt was found to have low blood pressure of 80/50; with increased weakness and dehydration.  Patient reports that she continues to take Coreg and Lasix as directed.  On exam- pt appears fatigued/weak; but nontoxic.  Patient denies any nausea, vomiting, diarrhea, or constipation.  Patient also denies any URI or UTI symptoms.  Patient was afebrile on exam today with temperature 98.7.    Patient will receive IV fluid rehydration while the cancer Center today.  Will also review all findings with the patient's cardiologist in regards to possible changes of her medication regimen.  Blood counts obtained today reveal a WBC of 14.9, ANC 12.9, hemoglobin 8.7, and platelet count 409.  Patient is scheduled to receive her 1 time brain irradiation on Monday, 09/22/2015.  Patient is scheduled for labs and a restaging PET scan on 09/30/2015.  Patient scheduled for her next follow-up visit on 10/03/2015.

## 2015-09-17 NOTE — Telephone Encounter (Signed)
s.w. pt and advised on OCT appt....ok and aware °

## 2015-09-17 NOTE — Assessment & Plan Note (Signed)
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

## 2015-09-17 NOTE — Telephone Encounter (Signed)
Received a call from [facility] in [place]. Pt here for simulation today- BP sitting is 80/50 rate 128 standing 84 49 rate 117. Dr. Isidore Moos would like to have pt seen in [facility] today. Pt takes Coreg BID. Pt denies any other symptoms. Sent [facility] for appt following RadOnc appt.

## 2015-09-17 NOTE — Telephone Encounter (Signed)
INR today- 1.3 Present Dose - 1 mg daily Dose increase per Dr. Jana Hakim- 2 mg daily  Patient stated understanding Repeat Lab - one week

## 2015-09-17 NOTE — Assessment & Plan Note (Signed)
While in radiation oncology department earlier this morning.-Pt was found to have low blood pressure of 80/50; with increased weakness and dehydration.  Patient reports that she continues to take Coreg and Lasix as directed.  Patient states that she has had minimal appetite and poor oral intake recently.  On exam- pt appears fatigued/weak; but nontoxic.  Patient denies any nausea, vomiting, diarrhea, or constipation.  Patient also denies any URI or UTI symptoms.  Patient's creatinine is elevated to 1.2 today; the patient has a history of some chronic renal insufficiency.  Also, elevated creatinine is most likely secondary to dehydration as well.  Patient was afebrile on exam today with temperature 98.7.    Patient will receive IV fluid rehydration while the cancer Center today.  Patient has a history of chronic CHF; with a low ejection fracture between 20 and 30% per latest echo results.  Will also review all findings with the patient's cardiologist in regards to possible changes of her medication regimen.  Blood counts obtained today reveal a WBC of 14.9, ANC 12.9, hemoglobin 8.7, and platelet count 409.  Patient is scheduled to receive her 1 time brain irradiation on Monday, 09/22/2015.  Patient is scheduled for labs and a restaging PET scan on 09/30/2015.  Patient scheduled for her next follow-up visit on 10/03/2015.

## 2015-09-17 NOTE — Progress Notes (Signed)
Pt left message with Raquel about financial assistance. I contacted pt and introduced myself as her Estate manager/land agent. I asked pt the type of financial assistance needed. Pt states she needs help with oral chemo medication. I advised pt that I would research foundations to see if there is assistance available and would contact her to follow up on 09/18/2015.

## 2015-09-17 NOTE — Assessment & Plan Note (Signed)
Creatinine has increased to 1.2 today.  Most likely, this is secondary to dehydration-the patient also has a history of some chronic renal insufficiency as well.  Will continue to monitor closely.

## 2015-09-17 NOTE — Progress Notes (Signed)
Radiation Oncology         [phone] ________________________________  Outpatient Reconsultation  Name: Erika [name] MRN: [id number]  Date: 09/17/2015  DOB: 04-02-1971  SK:AJGOTLXB, Erika School, NP  Consuela Mimes, MD   REFERRING PHYSICIAN: GUS MAGRINAT MD  DIAGNOSIS:    ICD-9-CM ICD-10-CM   1. Brain metastases ([place]) 198.3 C79.31    Breast cancer with brain metastases  HISTORY OF PRESENT ILLNESS::Erika [name] is a 44 y.o. female who is well known by me, she received radiotherapy in our clinic for locally advanced breast cancer 02/04/14 to 03/18/14. Recently a brain MRI was obtained on 09/04/15, for persistent nausea, dizziness, and headache. This was performed without contrast. There was a 7 mm right occipital lesion consistent with metastasis. A 3 tesla MRI was performed w/ cont on 09/12/15 for better staging. This was reviewed at tumor board. It reveals an 11 mm right occiptal lobe lesion, a 4 mm left parietal lobe lesion, a 9 mm left parietal lobe lesion, and two adjacent punctate left parietal lesions. Her last CT scan of the chest, abdomen, and pelvis was performed on 05/01/15. At that time she demonstrated progressive pleural disease, a relatively stable right hepatic lesion, borderline mediastinal adenopathy. Since June she had been receiving capecitabine every other week. Dr. Jana Hakim stopped this at the end of September. The plan is to obtain a PET scan in mid-October to reassess her systemic disease and decide whether capecitabine should continue. She was discussed at CNS tumor board, agreement on plan to proceed with SRS to brain.  She is accompanied by her boyfriend. Erika [name] fell yesterday walking into a store. She reports an abrasion on her knee, and states she is sore all over. Noting she fell from not raising her foot high enough. She also appears to be very lethargic, and her BP is low at 83/55 this morning.  Orthostatics negative. Tachycardic. Reports numbness in her fingertips and  toes. Denies weakness in legs or arms. She has not been eating or drinking well, attributing this to not feeling well. Her boyfriend notes that she drinks more than she eats. Denies urinary problems.  Reports only occasional headaches. Denies constant headaches or dizziness. Reports change in vision, where small things blur together. Recently completed a regimen of dexamethasone, 4 mg/ twice daily, prescribed by Dr. Marlowe Sax on 09/05/15 with 8 total tablets. She is currently taking carvedilol, 3.125 mg/ twice daily. She feels   better today than when she was hospitalized but doesn't feel very good overall.    PREVIOUS RADIATION THERAPY: Yes; Treatment Dates: 02-04-14 to 03-18-14  Site/dose:  1) Left Chest Wall / 50 Gy in 25 fractions  2) Left Supraclavicular fossa/ 46 Gy in 23 fractions  3) Left Posterior Axillary boost / 8.533 Gy in 23 fractions  4) Left Chest Wall Scar boost / 10 Gy in 5 fractions   PAST MEDICAL HISTORY:  has a past medical history of Headache(784.0); Anxiety; Neuropathy; DVT (deep venous thrombosis); S/P radiation therapy (02-04-14 to 03-18-14                           ); CHF (congestive heart failure); Iron deficiency anemia (11/26/13); Cancer of left breast; Metastases to the liver; and Metastasis to lung.    PAST SURGICAL HISTORY: Past Surgical History  Procedure Laterality Date  . Portacath placement Right 05/22/2013    Procedure: INSERTION PORT-A-CATH;  Surgeon: [name], MD;  Location: [facility];  Service: General;  Laterality:  Right;  . Breast surgery Left 05/21/13; 06/22/13     Needle Core Biopsy  . Mastectomy modified radical Left 11/15/2013    Procedure: LEFT MASTECTOMY MODIFIED RADICAL;  Surgeon: [name], MD;  Location: [facility];  Service: General;  Laterality: Left;  . Tubal ligation  08/1996    FAMILY HISTORY: family history includes Diabetes in her paternal grandfather, paternal grandmother, paternal uncle, paternal uncle, and paternal uncle; Diabetes (age of  onset: 82) in her father; Hypertension in her maternal grandfather, maternal grandmother, and mother; Stomach cancer in her maternal aunt.  SOCIAL HISTORY:  reports that she has never smoked. She has never used smokeless tobacco. She reports that she does not drink alcohol or use illicit drugs.  ALLERGIES: Doxycycline  MEDICATIONS:  Current Outpatient Prescriptions  Medication Sig Dispense Refill  . carvedilol (COREG) 3.125 MG tablet Take 1 tablet (3.125 mg total) by mouth 2 (two) times daily with a meal. 60 tablet 0  . cyclobenzaprine (FLEXERIL) 5 MG tablet TAKE 1 TABLET BY MOUTH THREE TIMES A DAY FOR MUSCLE SPASMS 30 tablet 1  . fentaNYL (DURAGESIC - DOSED MCG/HR) 25 MCG/HR patch Place 1 patch (25 mcg total) onto the skin every 3 (three) days. 10 patch 0  . fentaNYL (DURAGESIC - DOSED MCG/HR) 75 MCG/HR Place 1 patch (75 mcg total) onto the skin every 3 (three) days. 10 patch 0  . furosemide (LASIX) 20 MG tablet Take 1 tablet (20 mg total) by mouth daily. 30 tablet 6  . gabapentin (NEURONTIN) 300 MG capsule Take 1 capsule (300 mg total) by mouth 3 (three) times daily. 90 capsule 2  . loratadine (CLARITIN) 10 MG tablet Take 10 mg by mouth daily as needed for allergies.    Marland Kitchen LORazepam (ATIVAN) 0.5 MG tablet Take 1 tablet (0.5 mg total) by mouth every 6 (six) hours as needed for anxiety. 30 tablet 0  . ondansetron (ZOFRAN) 8 MG tablet Take 1 tablet (8 mg total) by mouth 2 (two) times daily. 20 tablet 1  . oxyCODONE (OXY IR/ROXICODONE) 5 MG immediate release tablet Take 1-2 tablets every 8 hrs as needed for severe or breakthrough pain 180 tablet 0  . potassium chloride SA (K-DUR,KLOR-CON) 20 MEQ tablet Take 1 tablet (20 mEq total) by mouth 4 (four) times daily. 120 tablet 1  . prochlorperazine (COMPAZINE) 10 MG tablet Take 1 tablet (10 mg total) by mouth every 8 (eight) hours as needed for nausea or vomiting. 30 tablet 0  . warfarin (COUMADIN) 2 MG tablet Take 1 mg by mouth daily.    Marland Kitchen zolpidem  (AMBIEN) 5 MG tablet Take 1 tablet (5 mg total) by mouth at bedtime as needed for sleep. 30 tablet 0  . capecitabine (XELODA) 500 MG tablet Take 1,500 mg by mouth 2 (two) times daily after a meal. Rotate for a week on then a week off     No current facility-administered medications for this encounter.   Facility-Administered Medications Ordered in Other Encounters  Medication Dose Route Frequency Provider Last Rate Last Dose  . Darbepoetin Alfa (ARANESP) injection 200 mcg  200 mcg Subcutaneous Once [name], NP      . sodium chloride 0.9 % injection 10 mL  10 mL Intravenous PRN [name], MD   10 mL at 06/19/15 1330  . topical emolient (BIAFINE) emulsion   Topical Daily [name], MD        REVIEW OF SYSTEMS:  Notable for that above.    PHYSICAL EXAM:  height is 5\' 5"  (1.651 m) and weight is 160 lb 12.8 oz (72.938 kg). Her temperature is 98.9 F (37.2 C). Her blood pressure is 84/49 and her pulse is 117.    Orthostatic Vitals taken at this visit: Sitting, blood pressure 80/50 and heart rate 128; Standing, blood pressure 84/49 and heart rate 117  General: Alert and oriented, in no acute distress HEENT: Head is normocephalic. Extraocular movements are intact. Oropharynx is clear no thrush. All visual quadrants are grossly intact. Neck: Neck is supple, no palpable cervical or supraclavicular lymphadenopathy. Heart: Tachycardic but regular rhythm with no murmurs, rubs, or gallops.  Chest: Clear to auscultation bilaterally, with no rhonchi, wheezes, or rales. Abdomen: Soft, nontender, nondistended, with no rigidity or guarding. Extremities: No cyanosis or edema. Lymphatics: see Neck Exam Skin: No concerning lesions. Musculoskeletal: symmetric strength and muscle tone throughout. Neurologic: Cranial nerves II through XII are grossly intact. No obvious focalities.  Speech is fluent. Coordination is intact.  Psychiatric: Judgment and insight are intact. Affect is  appropriate.    ECOG = 2  0 - Asymptomatic (Fully active, able to carry on all predisease activities without restriction)  1 - Symptomatic but completely ambulatory (Restricted in physically strenuous activity but ambulatory and able to carry out work of a light or sedentary nature. For example, light housework, office work)  2 - Symptomatic, <50% in bed during the day (Ambulatory and capable of all self care but unable to carry out any work activities. Up and about more than 50% of waking hours)  3 - Symptomatic, >50% in bed, but not bedbound (Capable of only limited self-care, confined to bed or chair 50% or more of waking hours)  4 - Bedbound (Completely disabled. Cannot carry on any self-care. Totally confined to bed or chair)  5 - Death   Eustace Pen MM, Creech RH, Tormey DC, et al. [phone]). "Toxicity and response criteria of the [facility]". [place] Oncol. 5 (6): 649-55   LABORATORY DATA:  Lab Results  Component Value Date   WBC 18.8* 09/09/2015   HGB 9.4* 09/09/2015   HCT 30.6* 09/09/2015   MCV 87.7 09/09/2015   PLT 563* 09/09/2015   CMP     Component Value Date/Time   NA 136 09/09/2015 1038   NA 138 09/06/2015 1310   K 5.3* 09/09/2015 1038   K 4.1 09/06/2015 1310   CL 106 09/06/2015 1310   CL 102 06/01/2013 1500   CO2 25 09/09/2015 1038   CO2 23 09/06/2015 1310   GLUCOSE 99 09/09/2015 1038   GLUCOSE 93 09/06/2015 1310   GLUCOSE 95 06/01/2013 1500   BUN 21.5 09/09/2015 1038   BUN 21* 09/06/2015 1310   CREATININE 1.0 09/09/2015 1038   CREATININE 0.95 09/06/2015 1310   CALCIUM 9.6 09/09/2015 1038   CALCIUM 9.4 09/06/2015 1310   PROT 8.3 09/09/2015 1038   PROT 9.1* 09/06/2015 1310   ALBUMIN 2.3* 09/09/2015 1038   ALBUMIN 2.8* 09/06/2015 1310   AST 40* 09/09/2015 1038   AST 36 09/06/2015 1310   ALT 16 09/09/2015 1038   ALT 11* 09/06/2015 1310   ALKPHOS 140 09/09/2015 1038   ALKPHOS 136* 09/06/2015 1310   BILITOT 0.32 09/09/2015 1038     BILITOT 0.4 09/06/2015 1310   GFRNONAA >60 09/06/2015 1310   GFRAA >60 09/06/2015 1310         RADIOGRAPHY: Dg Chest 2 View  09/06/2015   CLINICAL DATA:  Shortness of breath, weakness. History of breast  cancer.  EXAM: CHEST  2 VIEW  COMPARISON:  09/02/2015  FINDINGS: Right Port-A-Cath remains in place, unchanged. Right pleural effusion and multiple right lung nodules and masses again noted, unchanged. No confluent opacity on the left. Heart is normal size.  IMPRESSION: Stable right lung nodules and masses with right pleural effusion.  No significant change since prior study.   Electronically Signed   By: Rolm Baptise M.D.   On: 09/06/2015 13:05   Dg Chest 2 View  09/02/2015   CLINICAL DATA:  Hypotensive at her Dr's office today. Pt has hx of left side breast CA with left side mastectomy. Radiation therapy ending in October 2015. Hx of DVT. Nonsmoker.  EXAM: CHEST - 2 VIEW  COMPARISON:  CT 05/01/2015  FINDINGS: Left lung clear. Surgical clips in the left axilla. Heart size normal. Right subclavian port to the SVC RA junction.  Multiple peripheral right lung nodules. Right pleural thickening or loculated effusion. Significantly decreased right lung volume compared to left. No pneumothorax. Visualized skeletal structures are unremarkable.  IMPRESSION: 1. Multiple right lung nodules and extensive pleural disease as before. No acute/superimposed abnormality.   Electronically Signed   By: Lucrezia Europe M.D.   On: 09/02/2015 19:00   Mr Brain Wo Contrast  09/04/2015   CLINICAL DATA:  44 year old female with breast cancer treated with mastectomy and chemotherapy presenting with headache for the past 2 weeks worse over the past few days. Dizziness. Anemia. Subsequent encounter.  EXAM: MRI HEAD WITHOUT CONTRAST  TECHNIQUE: Multiplanar, multiecho pulse sequences of the brain and surrounding structures were obtained without intravenous contrast.  COMPARISON:  05/23/2015 MR.  FINDINGS: Exam is motion degraded.  New  from the prior examination is a 7 mm rounded structure with surrounding vasogenic edema within the right occipital lobe highly suspicious for metastatic disease. Contrast was not administered.  Change in appearance of bone marrow of the clivus and upper cervical spine may represent interval treatment of patient's known underlying anemia.  No acute infarct.  No intracranial hemorrhage.  Major intracranial vascular structures are patent.  Partially empty non expanded sella without change.  Minimal exophthalmos.  Polypoid opacification inferior right maxillary sinus.  C4-5 bulge greater to left with mild cord flattening incompletely assessed on the present exam.  IMPRESSION: Exam is motion degraded.  New from the prior examination is a 7 mm rounded structure with surrounding vasogenic edema within the right occipital lobe highly suspicious for metastatic disease. Contrast was not administered.  Change in appearance of bone marrow of the clivus and upper cervical spine may represent interval treatment of patient's known underlying anemia.  No acute infarct.  No intracranial hemorrhage.  C4-5 bulge greater to left with mild cord flattening incompletely assessed on the present exam.  These results will be called to the ordering clinician or representative by the Radiologist Assistant, and communication documented in the PACS or zVision Dashboard.   Electronically Signed   By: Genia Del M.D.   On: 09/04/2015 17:53   Mr Jeri Cos TI Contrast  09/12/2015   CLINICAL DATA:  Metastatic breast cancer. New right occipital lesion on recent prior noncontrast brain MRI. SRS targeting.  EXAM: MRI HEAD WITHOUT AND WITH CONTRAST  TECHNIQUE: Multiplanar, multiecho pulse sequences of the brain and surrounding structures were obtained without and with intravenous contrast.  CONTRAST:  57mL MULTIHANCE GADOBENATE DIMEGLUMINE 529 MG/ML IV SOLN  COMPARISON:  09/04/2015 noncontrast brain MRI  FINDINGS: There is no evidence of acute  infarct, intracranial hemorrhage, midline shift,  or extra-axial fluid collection. Ventricles and sulci are normal.  11 x 11 mm lesion in the right occipital lobe described on the recent noncontrast MRI demonstrates solid, mildly heterogeneous enhancement and unchanged, mild surrounding vasogenic edema (series 10, image 67). Additionally, there is a 4 mm enhancing subcortical lesion in the medial left parietal lobe (series 10, image 111), and there is a 9 mm enhancing lesion along the left central sulcus likely within the anterior bank of the postcentral gyrus (series 10, image 120). Neither of these lesions has significant associated edema. Immediately adjacent to the lesion in the precentral gyrus are 2 additional punctate enhancing foci (series 10, image 115 and 122).  Prior bilateral cataract extraction is noted. A small right maxillary sinus mucous retention cyst is noted. Mastoid air cells are clear. Major intracranial vascular flow voids are preserved.  IMPRESSION: Enhancing brain lesions consistent with metastases: 11 mm right occipital lobe, 4 mm left parietal lobe, 9 mm left parietal lobe, and 2 adjacent punctate left parietal lesions.   Electronically Signed   By: Logan Bores M.D.   On: 09/12/2015 17:26   US Renal  09/03/2015   CLINICAL DATA:  Hypertension. Acute kidney injury. Metastatic breast cancer  EXAM: RENAL / URINARY TRACT ULTRASOUND COMPLETE  COMPARISON:  Multiple exams, including 07/10/2015  FINDINGS: Right Kidney:  Length: 10.3 cm. Echogenicity within normal limits. No mass or hydronephrosis visualized.  Left Kidney:  Length: 11.3 cm. Echogenicity within normal limits. No mass or hydronephrosis visualized.  Bladder:  Appears normal for degree of bladder distention.  IMPRESSION: 1. Normal sonographic appearance of both kidneys. No abnormal echogenicity or hydronephrosis.   Electronically Signed   By: Van Clines M.D.   On: 09/03/2015 15:50      IMPRESSION/PLAN: Brain  metastases.  We discussed specifically targeted brain radiation  [facility]) versus whole brain radiation. I have discussed the side effects and benefits to brain radiotherapy and pros and cons of each technique. We spoke about the cognitive side effects and fatigue that can result from whole brain radiotherapy and we spoke about radionecrosis that can result from stereotactic radiosurgery. I explained that whole brain radiotherapy is more comprehensive and therefore can decrease the chance of recurrences elsewhere in the brain while stereotactic radiosurgery only treats the areas of gross disease while sparing the rest of the brain parenchyma.  After lengthy discussion, the patient and her husband would like to proceed with stereotactic radiosurgery - consent signed   We will continue with simulation and treatment planning today.  Restart dexamethasone if symptoms warrant.  After simulation, Ms. Muzio will see Selena Lesser NP to discuss her vitals, malaise, and current blood pressure medication.      This document serves as a record of services personally performed by [name], MD. It was created on her behalf by Arlyce Harman, a trained medical scribe. The creation of this record is based on the scribe's personal observations and the provider's statements to them. This document has been checked and approved by the attending provider. __________________________________________   [name], MD

## 2015-09-19 ENCOUNTER — Encounter: Payer: Self-pay | Admitting: Radiation Oncology

## 2015-09-19 DIAGNOSIS — Z510 Encounter for antineoplastic radiation therapy: Secondary | ICD-10-CM | POA: Diagnosis not present

## 2015-09-22 ENCOUNTER — Encounter: Payer: Self-pay | Admitting: Radiation Oncology

## 2015-09-22 ENCOUNTER — Ambulatory Visit
Admission: RE | Admit: 2015-09-22 | Discharge: 2015-09-22 | Disposition: A | Discharge status: Home / Self Care | Payer: No Typology Code available for payment source | Source: Ambulatory Visit | Attending: Radiation Oncology | Admitting: Radiation Oncology

## 2015-09-22 ENCOUNTER — Telehealth: Payer: Self-pay | Admitting: *Deleted

## 2015-09-22 VITALS — BP 91/62 | HR 117 | Temp 99.0°F

## 2015-09-22 DIAGNOSIS — C7931 Secondary malignant neoplasm of brain: Secondary | ICD-10-CM

## 2015-09-22 DIAGNOSIS — Z510 Encounter for antineoplastic radiation therapy: Secondary | ICD-10-CM | POA: Diagnosis not present

## 2015-09-22 MED ORDER — NYSTATIN 100000 UNIT/ML MT SUSP: 5.0000 mL | Freq: Four times a day (QID) | OROMUCOSAL | Status: DC

## 2015-09-22 NOTE — Progress Notes (Signed)
  Radiation Oncology         [phone] ________________________________  Stereotactic Treatment Procedure Note  Name: Erika [name] MRN: [id number]  Date: 09/22/2015  DOB: September 22, 1971  SPECIAL TREATMENT PROCEDURE  OUTPATIENT    ICD-9-CM ICD-10-CM   1. Brain metastases ([facility]) 198.3 C79.31 nystatin (MYCOSTATIN) 100000 UNIT/ML suspension    3D TREATMENT PLANNING AND DOSIMETRY:  The patient's radiation plan was reviewed and approved by neurosurgery and radiation oncology prior to treatment.  It showed 3-dimensional radiation distributions overlaid onto the planning CT/MRI image set.  The [facility] for the target structures as well as the organs at risk were reviewed. The documentation of the 3D plan and dosimetry are filed in the radiation oncology EMR.  NARRATIVE:  Erika [name] was brought to the TrueBeam stereotactic radiation treatment machine and placed supine on the CT couch. The head frame was applied, and the patient was set up for stereotactic radiosurgery.  Neurosurgery was present for the set-up and delivery  SIMULATION VERIFICATION:  In the couch zero-angle position, the patient underwent Exactrac imaging using the Brainlab system with orthogonal KV images.  These were carefully aligned and repeated to confirm treatment position for each of the isocenters.  The Exactrac snap film verification was repeated at each couch angle.  SPECIAL TREATMENT PROCEDURE: Erika [name] received stereotactic radiosurgery to the following targets: Left Parietal 59mm + punctate lesions target was treated using 5 Dynamic Conformal Arcs to a prescription dose of 20 Gy.  ExacTrac Snap verification was performed for each couch angle.   Right Occipital 66mm target was treated using 4 Dynamic Conformal Arcs to a prescription dose of 20 Gy.  ExacTrac Snap verification was performed for each couch angle.  Left Parietal 46mm target was treated using 3 Circular Arcs to a prescription dose of 20 Gy.  ExacTrac Snap  verification was performed for each couch angle.  This constitutes a special treatment procedure due to the ablative dose delivered and the technical nature of treatment.  This highly technical modality of treatment ensures that the ablative dose is centered on the patient's tumor while sparing normal tissues from excessive dose and risk of detrimental effects.  STEREOTACTIC TREATMENT MANAGEMENT:  Following delivery, the patient was transported to nursing in stable condition and monitored for possible acute effects.  Vital signs were recorded BP 91/62 mmHg  Pulse 117  Temp(Src) 99 F (37.2 C)  SpO2 99%  LMP 05/30/2013. The patient tolerated treatment without significant acute effects, and was discharged to home in stable condition. Nystatin Rx given for Thrush seen on exam.    PLAN: Follow-up in one month.  ________________________________   [name], MD

## 2015-09-22 NOTE — Telephone Encounter (Signed)
Erika [name] called requesting "radiation start date.  They already made the mask."  Informed her of appt today at 12:00 pm.

## 2015-09-22 NOTE — Addendum Note (Signed)
Encounter addended by: Ernst Spell, RN on: 09/22/2015  4:59 PM<BR>     Documentation filed: Notes Section, Inpatient Patient Education

## 2015-09-22 NOTE — Progress Notes (Signed)
[name] was given education regarding her [place] radiation today. She declined stating that she had received the booklet for her previous education. I went over side effects she may experience regarding her brain radiation including nausea, vomiting, hair loss, fatigue, skin changes. She was receptive to this but declined any further detailed education. She did state she would go over her booklet at home, and call us with any questions.

## 2015-09-22 NOTE — Progress Notes (Signed)
  Radiation Oncology         [phone]) [phone] ________________________________  Name: Erika [name] MRN: [id number]  Date: 09/22/2015  DOB: [birth year]  End of Treatment Note  Diagnosis:   Brain Metastases, breast primary  Indication for treatment:  palliative       Radiation treatment dates:   09-22-15  Site/dose/Beams/energy:  6FFF photon beams and SRS technique were used:  Left Parietal 30mm + punctate lesions target was treated using 5 Dynamic Conformal Arcs to a prescription dose of 20 Gy.     Right Occipital 59mm target was treated using 4 Dynamic Conformal Arcs to a prescription dose of 20 Gy.    Left Parietal 23mm target was treated using 3 Circular Arcs to a prescription dose of 20 Gy.  ExacTrac Snap verification was performed for each couch angle.   Narrative: The patient tolerated radiation treatment relatively well.   [name]  Plan: The patient has completed radiation treatment. The patient will return to radiation oncology clinic for routine followup in one month. I advised her to call or return sooner if she has any questions or concerns related to her recovery or treatment.  -----------------------------------  [name], MD

## 2015-09-23 NOTE — Op Note (Signed)
  Name: Erika [name]  MRN: [id number]  Date: 09/22/2015   DOB: 02-05-1971  Stereotactic Radiosurgery Operative Note  PRE-OPERATIVE DIAGNOSIS:  Multiple Brain Metastases  POST-OPERATIVE DIAGNOSIS:  Multiple Brain Metastases  PROCEDURE:  Stereotactic Radiosurgery  SURGEON:  Jairo Ben, MD  RADIATION ONCOLOGIST: Dr. [name], MD  NARRATIVE: The patient underwent a radiation treatment planning session in the radiation oncology simulation suite under the care of the radiation oncology physician and physicist.  I participated closely in the radiation treatment planning afterwards. The patient underwent planning CT which was fused to 3T high resolution MRI with 1 mm axial slices.  These images were fused on the planning system.  We contoured the gross target volumes and subsequently expanded this to yield the Planning Target Volume. I actively participated in the planning process.  I helped to define and review the target contours and also the contours of the optic pathway, eyes, brainstem and selected nearby organs at risk.  All the dose constraints for critical structures were reviewed and compared to [employer] Task Group 101.  The prescription dose conformity was reviewed.  I approved the plan electronically.    Accordingly, Erika [name] was brought to the TrueBeam stereotactic radiation treatment linac and placed in the custom immobilization mask.  The patient was aligned according to the IR fiducial markers with [facility] Exactrac, then orthogonal x-rays were used in ExacTrac with the 6DOF robotic table and the shifts were made to align the patient  Erika [name] received stereotactic radiosurgery uneventfully.    Lesions treated:  3   Left parietal 20Gy (with small satellite lesions)  Right occipital 20Gy  Left parietal 20Gy  The detailed description of the procedure is recorded in the radiation oncology procedure note.  I was present for the duration of the  procedure.  DISPOSITION:  Following delivery, the patient was transported to nursing in stable condition and monitored for possible acute effects to be discharged to home in stable condition with follow-up in one month.  Consuella Lose, Loletha Grayer, MD 09/23/2015 6:07 PM

## 2015-09-23 NOTE — Addendum Note (Signed)
Encounter addended by: Consuella Lose, MD on: 09/23/2015  6:10 PM<BR>     Documentation filed: Notes Section

## 2015-09-24 ENCOUNTER — Other Ambulatory Visit (HOSPITAL_BASED_OUTPATIENT_CLINIC_OR_DEPARTMENT_OTHER): Payer: No Typology Code available for payment source

## 2015-09-24 ENCOUNTER — Other Ambulatory Visit: Payer: Self-pay | Admitting: *Deleted

## 2015-09-24 ENCOUNTER — Telehealth: Payer: Self-pay | Admitting: *Deleted

## 2015-09-24 DIAGNOSIS — I82409 Acute embolism and thrombosis of unspecified deep veins of unspecified lower extremity: Secondary | ICD-10-CM

## 2015-09-24 DIAGNOSIS — I823 Embolism and thrombosis of renal vein: Secondary | ICD-10-CM

## 2015-09-24 DIAGNOSIS — C50212 Malignant neoplasm of upper-inner quadrant of left female breast: Secondary | ICD-10-CM

## 2015-09-24 DIAGNOSIS — C50912 Malignant neoplasm of unspecified site of left female breast: Secondary | ICD-10-CM

## 2015-09-24 LAB — CBC WITH DIFFERENTIAL/PLATELET
BASO%: 0.1 % (ref 0.0–2.0)
BASOS ABS: 0 10*3/uL (ref 0.0–0.1)
EOS%: 1.3 % (ref 0.0–7.0)
Eosinophils Absolute: 0.2 10*3/uL (ref 0.0–0.5)
HEMATOCRIT: 28.2 % — AB (ref 34.8–46.6)
HGB: 8.6 g/dL — ABNORMAL LOW (ref 11.6–15.9)
LYMPH%: 11.5 % — AB (ref 14.0–49.7)
MCH: 26.8 pg (ref 25.1–34.0)
MCHC: 30.5 g/dL — AB (ref 31.5–36.0)
MCV: 87.9 fL (ref 79.5–101.0)
MONO#: 0.5 10*3/uL (ref 0.1–0.9)
MONO%: 4.7 % (ref 0.0–14.0)
NEUT#: 9.4 10*3/uL — ABNORMAL HIGH (ref 1.5–6.5)
NEUT%: 82.4 % — AB (ref 38.4–76.8)
Platelets: 511 10*3/uL — ABNORMAL HIGH (ref 145–400)
RBC: 3.21 10*6/uL — ABNORMAL LOW (ref 3.70–5.45)
RDW: 19.3 % — ABNORMAL HIGH (ref 11.2–14.5)
WBC: 11.4 10*3/uL — ABNORMAL HIGH (ref 3.9–10.3)
lymph#: 1.3 10*3/uL (ref 0.9–3.3)
nRBC: 0 % (ref 0–0)

## 2015-09-24 LAB — COMPREHENSIVE METABOLIC PANEL (CC13)
ALK PHOS: 212 U/L — AB (ref 40–150)
ALT: 10 U/L (ref 0–55)
ANION GAP: 11 meq/L (ref 3–11)
AST: 31 U/L (ref 5–34)
Albumin: 1.9 g/dL — ABNORMAL LOW (ref 3.5–5.0)
BUN: 6.3 mg/dL — AB (ref 7.0–26.0)
CHLORIDE: 99 meq/L (ref 98–109)
CO2: 26 mEq/L (ref 22–29)
Calcium: 9.5 mg/dL (ref 8.4–10.4)
Creatinine: 0.7 mg/dL (ref 0.6–1.1)
Glucose: 100 mg/dl (ref 70–140)
POTASSIUM: 3.4 meq/L — AB (ref 3.5–5.1)
Sodium: 137 mEq/L (ref 136–145)
Total Bilirubin: 0.5 mg/dL (ref 0.20–1.20)
Total Protein: 8 g/dL (ref 6.4–8.3)

## 2015-09-24 LAB — PROTIME-INR
INR: 1.4 — AB (ref 2.00–3.50)
PROTIME: 16.8 s — AB (ref 10.6–13.4)

## 2015-09-24 NOTE — Telephone Encounter (Signed)
Received vm message from pt regarding her pt/inr done today. It was 1.4 Pt inquiring about her dosage of warfarin. Per last note regarding this on 09/17/15, pt is on 2 mg daily of warfarin.   Please advise.

## 2015-09-25 ENCOUNTER — Telehealth: Payer: Self-pay

## 2015-09-25 NOTE — Telephone Encounter (Signed)
LVM for patient to call back yesterday.  Spoke with patient this morning.  Per instructions on 09/17/15 when patient's INR was 1.30 patient was to increaese her coumadin dose from 1 mg daily to 2 mg daily.  Patient admits to only taking 1 mg daily and also having skipped a dose the day before her blood draw.  Patient stated understanding of dosing and will increase her dose to 2 mg daily.  She has been scheduled for a repeat blood draw on 09/30/15. INR 09/24/15- 1.40 Coumadin Dose- 09/25/15 2 mg daily Repeat lab- 09/30/15

## 2015-09-30 ENCOUNTER — Other Ambulatory Visit (HOSPITAL_BASED_OUTPATIENT_CLINIC_OR_DEPARTMENT_OTHER): Payer: No Typology Code available for payment source

## 2015-09-30 ENCOUNTER — Ambulatory Visit (HOSPITAL_COMMUNITY)
Admission: RE | Admit: 2015-09-30 | Discharge: 2015-09-30 | Disposition: A | Discharge status: Home / Self Care | Payer: No Typology Code available for payment source | Source: Ambulatory Visit | Attending: Oncology | Admitting: Oncology

## 2015-09-30 ENCOUNTER — Inpatient Hospital Stay (HOSPITAL_COMMUNITY): Admission: RE | Admit: 2015-09-30 | Payer: No Typology Code available for payment source | Source: Ambulatory Visit

## 2015-09-30 DIAGNOSIS — I427 Cardiomyopathy due to drug and external agent: Secondary | ICD-10-CM

## 2015-09-30 DIAGNOSIS — I823 Embolism and thrombosis of renal vein: Secondary | ICD-10-CM | POA: Diagnosis not present

## 2015-09-30 DIAGNOSIS — I82409 Acute embolism and thrombosis of unspecified deep veins of unspecified lower extremity: Secondary | ICD-10-CM

## 2015-09-30 DIAGNOSIS — C7802 Secondary malignant neoplasm of left lung: Secondary | ICD-10-CM | POA: Diagnosis not present

## 2015-09-30 DIAGNOSIS — D630 Anemia in neoplastic disease: Secondary | ICD-10-CM | POA: Diagnosis not present

## 2015-09-30 DIAGNOSIS — C7989 Secondary malignant neoplasm of other specified sites: Secondary | ICD-10-CM | POA: Insufficient documentation

## 2015-09-30 DIAGNOSIS — C50212 Malignant neoplasm of upper-inner quadrant of left female breast: Secondary | ICD-10-CM | POA: Insufficient documentation

## 2015-09-30 DIAGNOSIS — C787 Secondary malignant neoplasm of liver and intrahepatic bile duct: Secondary | ICD-10-CM | POA: Diagnosis not present

## 2015-09-30 DIAGNOSIS — C778 Secondary and unspecified malignant neoplasm of lymph nodes of multiple regions: Secondary | ICD-10-CM | POA: Diagnosis not present

## 2015-09-30 DIAGNOSIS — T451X5A Adverse effect of antineoplastic and immunosuppressive drugs, initial encounter: Secondary | ICD-10-CM

## 2015-09-30 DIAGNOSIS — C50912 Malignant neoplasm of unspecified site of left female breast: Secondary | ICD-10-CM

## 2015-09-30 DIAGNOSIS — C7801 Secondary malignant neoplasm of right lung: Secondary | ICD-10-CM | POA: Insufficient documentation

## 2015-09-30 DIAGNOSIS — C7951 Secondary malignant neoplasm of bone: Secondary | ICD-10-CM | POA: Diagnosis not present

## 2015-09-30 DIAGNOSIS — K8020 Calculus of gallbladder without cholecystitis without obstruction: Secondary | ICD-10-CM | POA: Insufficient documentation

## 2015-09-30 DIAGNOSIS — Z9012 Acquired absence of left breast and nipple: Secondary | ICD-10-CM | POA: Insufficient documentation

## 2015-09-30 LAB — CBC WITH DIFFERENTIAL/PLATELET
BASO%: 0.2 % (ref 0.0–2.0)
BASOS ABS: 0 10*3/uL (ref 0.0–0.1)
EOS%: 0.5 % (ref 0.0–7.0)
Eosinophils Absolute: 0.1 10*3/uL (ref 0.0–0.5)
HEMATOCRIT: 32.4 % — AB (ref 34.8–46.6)
HEMOGLOBIN: 9.9 g/dL — AB (ref 11.6–15.9)
LYMPH#: 1.4 10*3/uL (ref 0.9–3.3)
LYMPH%: 9.4 % — ABNORMAL LOW (ref 14.0–49.7)
MCH: 26.3 pg (ref 25.1–34.0)
MCHC: 30.6 g/dL — ABNORMAL LOW (ref 31.5–36.0)
MCV: 86 fL (ref 79.5–101.0)
MONO#: 0.9 10*3/uL (ref 0.1–0.9)
MONO%: 5.8 % (ref 0.0–14.0)
NEUT%: 84.1 % — ABNORMAL HIGH (ref 38.4–76.8)
NEUTROS ABS: 12.8 10*3/uL — AB (ref 1.5–6.5)
Platelets: 701 10*3/uL — ABNORMAL HIGH (ref 145–400)
RBC: 3.76 10*6/uL (ref 3.70–5.45)
RDW: 21.6 % — AB (ref 11.2–14.5)
WBC: 15.2 10*3/uL — AB (ref 3.9–10.3)

## 2015-09-30 LAB — COMPREHENSIVE METABOLIC PANEL (CC13)
ALBUMIN: 1.9 g/dL — AB (ref 3.5–5.0)
ALK PHOS: 207 U/L — AB (ref 40–150)
ALT: 10 U/L (ref 0–55)
AST: 44 U/L — AB (ref 5–34)
Anion Gap: 12 mEq/L — ABNORMAL HIGH (ref 3–11)
BUN: 8.4 mg/dL (ref 7.0–26.0)
CALCIUM: 9.8 mg/dL (ref 8.4–10.4)
CO2: 28 mEq/L (ref 22–29)
CREATININE: 0.7 mg/dL (ref 0.6–1.1)
Chloride: 99 mEq/L (ref 98–109)
EGFR: >90 mL/min/{1.73_m2} (ref >90)
GLUCOSE: 113 mg/dL (ref 70–140)
Potassium: 3.8 mEq/L (ref 3.5–5.1)
SODIUM: 138 meq/L (ref 136–145)
TOTAL PROTEIN: 8.3 g/dL (ref 6.4–8.3)
Total Bilirubin: 0.48 mg/dL (ref 0.20–1.20)

## 2015-09-30 LAB — PROTIME-INR
INR: 1.7 — ABNORMAL LOW (ref 2.00–3.50)
PROTIME: 20.4 s — AB (ref 10.6–13.4)

## 2015-09-30 LAB — GLUCOSE, CAPILLARY: GLUCOSE-CAPILLARY: 117 mg/dL — AB (ref 65–99)

## 2015-09-30 LAB — HOLD TUBE, BLOOD BANK

## 2015-09-30 MED ORDER — FLUDEOXYGLUCOSE F - 18 (FDG) INJECTION
7.4000 | Freq: Once | INTRAVENOUS | Status: DC | PRN
  Administered 2015-09-30: 7.4 via INTRAVENOUS
  Filled 2015-09-30: qty 7.4

## 2015-10-01 ENCOUNTER — Telehealth: Payer: Self-pay

## 2015-10-01 ENCOUNTER — Telehealth: Payer: Self-pay | Admitting: Oncology

## 2015-10-01 ENCOUNTER — Other Ambulatory Visit: Payer: Self-pay

## 2015-10-01 NOTE — Telephone Encounter (Signed)
Called patient two times and LVM asking patient to return this call regarding her INR results. INR: 1.70 Present Dose: 2 mg daily Increase Dose Change : 4 mg Wednesday and Saturday. 2 mg all other days Recheck Labs late next week.

## 2015-10-01 NOTE — Telephone Encounter (Signed)
Called and left a message with 10/28 lab

## 2015-10-02 NOTE — Addendum Note (Signed)
Encounter addended by: Doreen Beam, RN on: 10/02/2015 11:39 AM<BR>     Documentation filed: Charges VN

## 2015-10-02 NOTE — Telephone Encounter (Signed)
Informed patient of the dosage change and upcoming lab appointment. Patient verbalized understanding.

## 2015-10-03 ENCOUNTER — Telehealth: Payer: Self-pay | Admitting: Oncology

## 2015-10-03 ENCOUNTER — Other Ambulatory Visit: Payer: Self-pay | Admitting: Oncology

## 2015-10-03 ENCOUNTER — Encounter: Payer: No Typology Code available for payment source | Admitting: Oncology

## 2015-10-03 ENCOUNTER — Other Ambulatory Visit: Payer: Self-pay

## 2015-10-03 DIAGNOSIS — C50919 Malignant neoplasm of unspecified site of unspecified female breast: Secondary | ICD-10-CM

## 2015-10-03 DIAGNOSIS — C7931 Secondary malignant neoplasm of brain: Principal | ICD-10-CM

## 2015-10-03 NOTE — Progress Notes (Unsigned)
Keir had an appointment with me today but she forgot. We called her and she appears somnolent. She says she is sleeping all the time. It is not clear whether she is taking her medications correctly.  I wanted to speak with her today to place a hospice referral, which I think will be of great benefit to her. We have moved her appointment for Monday with one of our 37 assistants and I will gladly meet with her then to discuss this further.

## 2015-10-03 NOTE — Progress Notes (Signed)
Per Dr. Jana Hakim patient is to see Sharene Butters on Monday 10/06/15 to talk about hospice.  Schedulers to call patient with appt.

## 2015-10-03 NOTE — Progress Notes (Unsigned)
No show

## 2015-10-03 NOTE — Telephone Encounter (Signed)
Called and left a message with 10/24 appointments

## 2015-10-03 NOTE — Progress Notes (Signed)
Spoke with patient regarding missed appt today and went over her INR result from the beginning of the week .  Patient is aware of coumadin dosing and completely forgot her appt.  Sent msg through to Dr. Jana Hakim requesting a new date/time for her.

## 2015-10-06 ENCOUNTER — Other Ambulatory Visit: Payer: No Typology Code available for payment source

## 2015-10-06 ENCOUNTER — Ambulatory Visit: Payer: No Typology Code available for payment source | Admitting: Physician Assistant

## 2015-10-06 ENCOUNTER — Telehealth: Payer: Self-pay | Admitting: *Deleted

## 2015-10-06 ENCOUNTER — Encounter: Payer: Self-pay | Admitting: Oncology

## 2015-10-06 ENCOUNTER — Ambulatory Visit (HOSPITAL_BASED_OUTPATIENT_CLINIC_OR_DEPARTMENT_OTHER): Payer: No Typology Code available for payment source | Admitting: Oncology

## 2015-10-06 ENCOUNTER — Other Ambulatory Visit: Payer: Self-pay | Admitting: Oncology

## 2015-10-06 VITALS — BP 105/71 | HR 149 | Temp 97.3°F | Resp 16 | Ht 65.0 in | Wt 152.0 lb

## 2015-10-06 DIAGNOSIS — C7931 Secondary malignant neoplasm of brain: Secondary | ICD-10-CM

## 2015-10-06 DIAGNOSIS — C50212 Malignant neoplasm of upper-inner quadrant of left female breast: Secondary | ICD-10-CM

## 2015-10-06 MED ORDER — OXYCODONE HCL 5 MG PO TABS: ORAL_TABLET | ORAL | Status: DC

## 2015-10-06 NOTE — Telephone Encounter (Signed)
This RN spoke with pt- and scheduled her to come in this afternoon with KC/NP per MD.

## 2015-10-06 NOTE — Progress Notes (Signed)
ID: Erika [name] OB: 1971-09-28  MR#: [id number]  CSN#:[id number]  PCP: Imelda Pillow, NP GYN:   SU: Dr. [name] OTHER MD:  CHIEF COMPLAINT:  metastatic breast cancer, congestive heart failure  CURRENT TREATMENT: capecitabine; SRS pending  BREAST CANCER HISTORY: From Dr. [name] original intake note:  "Patient developed a left sided breast mass show up about 6 months prior to diagnosis. 2 months prior this began getting larger rapidly. She was seen in an urgent care where this mass was thought to be an abscess. Apparently there was an attempt to excise or drain this that was not successful and she was referred to Dr. [name] for evaluation. She underwent punch biopsy on 04/23/13 of her skin and the mass. She was then also sent for mm/us. The biopsy shows invasive high grade carcinoma that appears to be breast. It is ER 14%, PR negative, HER-2/neu negative with a Ki-67 that approaches 100%. She has also undergone mm with at least 10 cm upper inner left breast tumor with mild overyling skin thickening. There is also a 3x10 mm cluster of calcs near there also. There is indeterminate 7 mm level 1 left axillary node, she underwent a lymph node biopsy that was positive for disease. No mm evidence of right breast malignancy. An MRI of the breasts was done on 05/22/13 and showed a 14 cm mass in the left breast protruding from the skin and also a 2.5 cm level II axillary node."  Her subsequent history is as detailed below  INTERVAL HISTORY:   Erika [name] returns today for follow up of her breast cancer, accompanied by her significant other, Jermaine.  Since her last visit here she had a PET scan and is here to review the results. Having more shortness of breath.   REVIEW OF SYSTEMS:  Erika [name] 's feeling very tired, short of breath, and has "not been doing much". She has not had any recent fevers, bleeding, or rash. She denies diarrhea or constipation problems or any change in bladder habits.  Her pain is well-controlled on her current medications. She uses 2 separate Duragesic patches because she found 100 mcg/h patch would not "stick" and Oxycodone for breakthrough pain. She denies headaches, nausea or vomiting. Denies dizziness or falls. Her vision is at times blurred. A detailed review of systems was otherwise stable  PAST MEDICAL HISTORY: Past Medical History  Diagnosis Date  . Headache(784.0)     Hx: of Migraines  . Anxiety     ANXIOUS ABOUT HAVEING BIG SURGERY - MASTECTOMY  . 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 DVT (deep venous thrombosis) (HCC)     RT ARM-PT ON LOVENOX  . S/P radiation therapy 02-04-14 to 03-18-14                               1) Left Chest Wall / 50 Gy in 25 fractions/ 2) Left Supraclavicular fossa/ 46 Gy in 23 fractions / 3) Left Posterior Axillary boost / 8.533 Gy in 23 fractions/ 4) Left Chest Wall Scar boost / 10 Gy in 5 fractions  . CHF (congestive heart failure) ([place])   . Iron deficiency anemia 11/26/13  . Cancer of left breast ([place])     Invasive High Grade Carcinoma; S/P mastectomy, Adriamycin and Cytoxan, radiation  . Metastases to the liver ([place])   . Metastasis to lung [facility])     PAST SURGICAL HISTORY: Past Surgical History  Procedure Laterality Date  . Portacath placement Right 05/22/2013    Procedure: INSERTION PORT-A-CATH;  Surgeon: [name], MD;  Location[facility] OR;  Service: General;  Laterality: Right;  . Breast surgery Left 05/21/13; 06/22/13     Needle Core Biopsy  . Mastectomy modified radical Left 11/15/2013    Procedure: LEFT MASTECTOMY MODIFIED RADICAL;  Surgeon: [name], MD;  Location: [facility] ORS;  Service: General;  Laterality: Left;  . Tubal ligation  08/1996    FAMILY HISTORY Family History  Problem Relation Age of Onset  . Hypertension Mother   . Diabetes Father 34  . Stomach cancer Maternal Aunt   . Hypertension Maternal Grandmother   . Hypertension Maternal Grandfather   . Diabetes Paternal  Grandmother   . Diabetes Paternal Grandfather   . Diabetes Paternal Uncle   . Diabetes Paternal Uncle   . Diabetes Paternal Uncle   The patient's mother is alive, age 53. The patient's father died from complications of diabetes at the age of 52. The patient has 2 brothers, one sister. There is no history of breast or ovarian cancer in the family to her knowledge .the patient has not undergone genetic testing   GYNECOLOGIC HISTORY:  Menarche age 76, first live birth age 40. The patient is GX P3. The patient has been on goserelin since 06/13/2013, given monthly  SOCIAL HISTORY:  The patient has worked at as a Furniture conservator/restorer and a Retail buyer. She is currently applying for disability. At home she lives with her youngest son, [name], 14; with the patient's brother Rosario Duey; and with the patient's fiance [name].   ADVANCED DIRECTIVES: Not in place; at the 09/09/2015 visit the patient was given advanced directives to complete at her discretion; I suggested she bring it next visit so  it can be notarized  HEALTH MAINTENANCE: Social History  Substance Use Topics  . Smoking status: Never Smoker   . Smokeless tobacco: Never Used  . Alcohol Use: No    Allergies  Allergen Reactions  . Doxycycline Nausea And Vomiting    Current Outpatient Prescriptions  Medication Sig Dispense Refill  . cyclobenzaprine (FLEXERIL) 5 MG tablet TAKE 1 TABLET BY MOUTH THREE TIMES A DAY FOR MUSCLE SPASMS 30 tablet 1  . fentaNYL (DURAGESIC - DOSED MCG/HR) 25 MCG/HR patch Place 1 patch (25 mcg total) onto the skin every 3 (three) days. 10 patch 0  . fentaNYL (DURAGESIC - DOSED MCG/HR) 75 MCG/HR Place 1 patch (75 mcg total) onto the skin every 3 (three) days. 10 patch 0  . gabapentin (NEURONTIN) 300 MG capsule Take 1 capsule (300 mg total) by mouth 3 (three) times daily. 90 capsule 2  . loratadine (CLARITIN) 10 MG tablet Take 10 mg by mouth daily as needed for allergies.    Marland Kitchen LORazepam (ATIVAN) 0.5 MG tablet Take  1 tablet (0.5 mg total) by mouth every 6 (six) hours as needed for anxiety. 30 tablet 0  . nystatin (MYCOSTATIN) 100000 UNIT/ML suspension Take 5 mLs (500,000 Units total) by mouth 4 (four) times daily. Swish in mouth as long as possible, then swallow.  Use for 3 weeks. 473 mL 0  . ondansetron (ZOFRAN) 8 MG tablet Take 1 tablet (8 mg total) by mouth 2 (two) times daily. 20 tablet 1  . oxyCODONE (OXY IR/ROXICODONE) 5 MG immediate release tablet Take 1-2 tablets every 8 hrs as needed for severe or breakthrough pain 180 tablet 0  . potassium chloride SA (K-DUR,KLOR-CON) 20 MEQ tablet Take 1 tablet (20  mEq total) by mouth 4 (four) times daily. 120 tablet 1  . prochlorperazine (COMPAZINE) 10 MG tablet Take 1 tablet (10 mg total) by mouth every 8 (eight) hours as needed for nausea or vomiting. 30 tablet 0  . warfarin (COUMADIN) 2 MG tablet Take 1 mg by mouth daily.    Marland Kitchen zolpidem (AMBIEN) 5 MG tablet Take 1 tablet (5 mg total) by mouth at bedtime as needed for sleep. 30 tablet 0   No current facility-administered medications for this visit.   Facility-Administered Medications Ordered in Other Visits  Medication Dose Route Frequency Provider Last Rate Last Dose  . Darbepoetin Alfa (ARANESP) injection 200 mcg  200 mcg Subcutaneous Once [name], NP      . sodium chloride 0.9 % injection 10 mL  10 mL Intravenous PRN [name], MD   10 mL at 06/19/15 1330  . topical emolient (BIAFINE) emulsion   Topical Daily [name], MD        OBJECTIVE: middle-aged African American woman  We'll appears chronically ill Filed Vitals:   10/06/15 1455  BP: 105/71  Pulse: 149  Temp: 97.3 F (36.3 C)  Resp: 16     Body mass index is 25.29 kg/(m^2).      ECOG FS:2 - Symptomatic, <50% confined to bed   Sclerae unicteric, EOMs intact Oropharynx clear and moist No cervical or supraclavicular adenopathy Lungs no rales or rhonchi , fair excursion bilaterally Heart tachycardia, regular Abd soft,  distended, nontender, positive bowel sounds MSK no focal spinal tenderness Neuro: nonfocal, well oriented,  positive affect Breasts:  deferred  skin: Significant hyperpigmentation and dryness of the palms of both hands, without overt cracking   LAB RESULTS:  CMP     Component Value Date/Time   NA 138 09/30/2015 1134   NA 138 09/06/2015 1310   K 3.8 09/30/2015 1134   K 4.1 09/06/2015 1310   CL 106 09/06/2015 1310   CL 102 06/01/2013 1500   CO2 28 09/30/2015 1134   CO2 23 09/06/2015 1310   GLUCOSE 113 09/30/2015 1134   GLUCOSE 93 09/06/2015 1310   GLUCOSE 95 06/01/2013 1500   BUN 8.4 09/30/2015 1134   BUN 21* 09/06/2015 1310   CREATININE 0.7 09/30/2015 1134   CREATININE 0.95 09/06/2015 1310   CALCIUM 9.8 09/30/2015 1134   CALCIUM 9.4 09/06/2015 1310   PROT 8.3 09/30/2015 1134   PROT 9.1* 09/06/2015 1310   ALBUMIN 1.9* 09/30/2015 1134   ALBUMIN 2.8* 09/06/2015 1310   AST 44* 09/30/2015 1134   AST 36 09/06/2015 1310   ALT 10 09/30/2015 1134   ALT 11* 09/06/2015 1310   ALKPHOS 207* 09/30/2015 1134   ALKPHOS 136* 09/06/2015 1310   BILITOT 0.48 09/30/2015 1134   BILITOT 0.4 09/06/2015 1310   GFRNONAA >60 09/06/2015 1310   GFRAA >60 09/06/2015 1310    I No results found for: SPEP  Lab Results  Component Value Date   WBC 15.2* 09/30/2015   NEUTROABS 12.8* 09/30/2015   HGB 9.9* 09/30/2015   HCT 32.4* 09/30/2015   MCV 86.0 09/30/2015   PLT 701* 09/30/2015      Chemistry      Component Value Date/Time   NA 138 09/30/2015 1134   NA 138 09/06/2015 1310   K 3.8 09/30/2015 1134   K 4.1 09/06/2015 1310   CL 106 09/06/2015 1310   CL 102 06/01/2013 1500   CO2 28 09/30/2015 1134   CO2 23 09/06/2015 1310   BUN  8.4 09/30/2015 1134   BUN 21* 09/06/2015 1310   CREATININE 0.7 09/30/2015 1134   CREATININE 0.95 09/06/2015 1310      Component Value Date/Time   CALCIUM 9.8 09/30/2015 1134   CALCIUM 9.4 09/06/2015 1310   ALKPHOS 207* 09/30/2015 1134   ALKPHOS 136*  09/06/2015 1310   AST 44* 09/30/2015 1134   AST 36 09/06/2015 1310   ALT 10 09/30/2015 1134   ALT 11* 09/06/2015 1310   BILITOT 0.48 09/30/2015 1134   BILITOT 0.4 09/06/2015 1310       Lab Results  Component Value Date   LABCA2 35 05/18/2013    No components found for: [id number]   Recent Labs Lab 09/30/15 1134  INR 1.70*    Urinalysis    Component Value Date/Time   COLORURINE YELLOW 09/06/2015 1415    STUDIES: Mr Jeri Cos IZ Contrast  2015/09/19  CLINICAL DATA:  Metastatic breast cancer. New right occipital lesion on recent prior noncontrast brain MRI. SRS targeting. EXAM: MRI HEAD WITHOUT AND WITH CONTRAST TECHNIQUE: Multiplanar, multiecho pulse sequences of the brain and surrounding structures were obtained without and with intravenous contrast. CONTRAST:  19m MULTIHANCE GADOBENATE DIMEGLUMINE 529 MG/ML IV SOLN COMPARISON:  09/04/2015 noncontrast brain MRI FINDINGS: There is no evidence of acute infarct, intracranial hemorrhage, midline shift, or extra-axial fluid collection. Ventricles and sulci are normal. 11 x 11 mm lesion in the right occipital lobe described on the recent noncontrast MRI demonstrates solid, mildly heterogeneous enhancement and unchanged, mild surrounding vasogenic edema (series 10, image 67). Additionally, there is a 4 mm enhancing subcortical lesion in the medial left parietal lobe (series 10, image 111), and there is a 9 mm enhancing lesion along the left central sulcus likely within the anterior bank of the postcentral gyrus (series 10, image 120). Neither of these lesions has significant associated edema. Immediately adjacent to the lesion in the precentral gyrus are 2 additional punctate enhancing foci (series 10, image 115 and 122). Prior bilateral cataract extraction is noted. A small right maxillary sinus mucous retention cyst is noted. Mastoid air cells are clear. Major intracranial vascular flow voids are preserved. IMPRESSION: Enhancing brain lesions  consistent with metastases: 11 mm right occipital lobe, 4 mm left parietal lobe, 9 mm left parietal lobe, and 2 adjacent punctate left parietal lesions. Electronically Signed   By: ALogan BoresM.D.   On: 0October 07, 201617:26   Nm Pet Image Restag (ps) Skull Base To Thigh  09/30/2015  CLINICAL DATA:  Subsequent treatment strategy for metastatic breast cancer. Restaging examination after treatment. EXAM: NUCLEAR MEDICINE PET SKULL BASE TO THIGH TECHNIQUE: 7.4 mCi mCi F-18 FDG was injected intravenously. Full-ring PET imaging was performed from the skull base to thigh after the radiotracer. CT data was obtained and used for attenuation correction and anatomic localization. FASTING BLOOD GLUCOSE:  Value: 117 mg/dl COMPARISON:  PET-CT 06/13/2013. CT of the chest, abdomen and pelvis 05/01/2015. FINDINGS: NECK No hypermetabolic lymph nodes in the neck. CHEST Significant interval increase and color oral thickening and nodularity, with multiple mass-like areas which are diffusely hypermetabolic (SUVmax = 212.4. Multiple pulmonary nodules are also noted in the lungs bilaterally, largest of which is in the left lower lobe measuring 1.5 cm. These are all hypermetabolic, compatible with metastatic lesions, and they have significantly increased in number and size compared to the prior examination. Extensive hypermetabolic lymphadenopathy is noted throughout the mediastinum, most evident in the subcarinal nodal station where there is a hypermetabolic (SUVmax = 258)09mm short axis  lymph node, and a 15 mm low left paratracheal lymph node that is hypermetabolic (SUVmax = 64.4). Right subclavian single-lumen porta cath with tip terminating in the distal superior vena cava. Status post left modified radical mastectomy and left axillary nodal dissection. No soft tissue mass along the left chest wall to suggest local recurrence of disease. ABDOMEN/PELVIS Significant interval enlargement of a large hypermetabolic (SUVmax = 03.4)VQQVZD  centered in the right lobe of the liver which measures approximately 6.8 cm (image 104 of series 4). Several other small hypo attenuating hypermetabolic liver lesions are also noted, new compared to prior studies, compatible with progressive metastatic disease to the liver. No abnormal hypermetabolic activity within the pancreas, adrenal glands, or spleen. Calcified gallstones lying dependently in the gallbladder. No findings to suggest an acute cholecystitis at this time. Mildly enlarged 1 cm short axis upper abdominal lymph node adjacent to the crus of the right hemidiaphragm (image 100 of series 4) is hypermetabolic (SUVmax = 7.2). SKELETON Scattered soft tissue nodules are noted in the subcutaneous fat, largest of which measures 12 mm in the left flank soft tissues (image 117 of series 4) are hypermetabolic (SUVmax = 8.4), compatible with metastatic lesions. Subtle area of osteolysis in the left ilium demonstrates hypermetabolism (SUVmax = 6.6), concerning for skeletal metastasis. IMPRESSION: 1. Today's study demonstrates progression of widespread metastatic disease throughout the right pleural space, the lungs bilaterally, mediastinal nodal stations, liver, upper abdominal lymph nodes, body wall soft tissues, and the bones, as detailed above. 2. Cholelithiasis without evidence of acute cholecystitis at this time. 3. Additional incidental findings, as above. Electronically Signed   By: Vinnie Langton M.D.   On: 09/30/2015 15:54    ASSESSMENT: 44 y.o.  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  (0) genetic testing of the ATM, BARD1, BRCA1, BRCA2, BRIP1, CDH1, CHEK2, EPCAM, FANCC, MLH1, MSH2, MSH6, NBN, PALB2, PMS2, PTEN, RAD51C, RAD51D, STK11, TP53, and XRCC2 genes showed no deleterious mutations  (1) status post left breast biopsy 04/23/2013 for a clinical T4 N1, stage IIIB invasive ductal carcinoma, grade 3,  estrogen receptor 14% positive with moderate staining  intensity, progesterone receptor negative,  with an MIB-1 of 100% and HER-2/neu  nonamplified  (2) biopsy of a left axillary lymph node [phone] was positive  (3) started monthly goserelin July of 02/01/2013  (4)  received  neoadjuvant chemotherapy with doxorubicin and cyclophosphamide in dose dense fashion x4, followed by carboplatin and paclitaxel x1, discontinued due to neuropathy, followed by carboplatin and gemcitabine, both given day 1 of each 14 day cycle x6, completed 10/12/2013 .   (5) right upper extremity DVT documented 06/29/2013,  treated with Lovenox from July  through December 2014    (6) status post left modified radical mastectomy 11/15/2013 for a residual pT3 pN0 invasive ductal carcinoma, grade 3, with repeat prognostic panel triple negative. All 12 axillary lymph nodes were clear. Margins were negative   (5) completed adjuvant radiation therapy 03/18/2014,  with capecitabine added 2-3 weeks into treatment.  METASTATIC DISEASE  (6) CT scans of the chest, abdomen, and pelvis 06/26/2014 showed a large right pleural effusion  with pleural studding, 2 left upper lobe nodules, and two  liver lesions. Cytology from right thoracentesis 07/03/2014 showed malignant cells consistent with the patient's known primary  (7) started eribulin 07/09/2014, given days 1 and 8 of each 21 day cycle; restagind studies after cycle 3 showed evidence of response. Discontinued after cycle 5 (last dose 10/08/14) because of worsening peripheral  neuropathy--also with progression.   (8) referral to [facility] initiated 07/08/2014  (9) peripheral neuropathy secondary to chemotherapy--improved with discontinuation of eribulin  (10) started cyclophosphamide, methotrexate, fluorouracil (CMF) 11/19/2014, repeated every 21 days  (a) restaging scans 01/28/2015 shows stable disease  (b) restaging scans 05/01/2015 show evidence of progression: CMF discontinued  (11) left renal vein thrombosis noted on scans  01/28/2015; on Coumadin as of 02/03/2015 (after heparin bridge)  (a) hypercoagulable panel shows no lupus anticoagulant, no factor V Leiden or prothrombin gene mutations. Antithrombin level was normal.  (12) to start liposomal doxorubicin (Doxil) 05/14/2015, canceled because of low ejection fraction  (13) started capecitabine1.5g BID, 05/20/15, 1 week on, 1 week off  (14) CHF with echo 05/21/2015 showing an ejection fraction of 25-30%  (15) right occipital brain metastasis noted on brain MRI 09/04/2015  PLAN:   The patient was seen and discussed with Dr. Jana Hakim.  Explained that the PET scan showed worsening of her cancer particularly in the lungs and liver. Explained that additional chemotherapy may shorten her life. Since she has cardiac and liver issues, the chemo dosages would be very small and the chance of a response is very low. Explained that treatment would be more likely to hurt than help. Recommended Hospice with best supportive care. Recommended that she sign DNR papers.   The patient understands this discussion and these things were written down for her. She is agreeable to a Hospice referral at this time and this referral was made to [facility] and [facility].  We will plan to see her back in about 10 days.  She knows to call for any problems that may develop before her next visit here.  Erika Bussing, NP 10/06/2015 4:31 PM     ADDENDUM: I discussed the overall situation in detail with trivial area and her significant other germane. She understands that the cancer has continued to progress particularly in the vital organs liver and lung and that therefore her most recent treatment was not effective. We discussed the fact that she has had multiple chemotherapy agents in the past including the first-line treatments, which were not able to clear this cancer. One would therefore expect second third and fourth line treatments to be less effective.  Note only  that, but given her heart and other end organ problems I would have to so severely limit the dose that the chance of her having any meaningful response would be close to 0.  In other words any chemotherapy that I could give her is more likely to hurt her then to help her. Because her tumor was triple negative, unfortunately we do not have the option of trying "easier" treatments like tamoxifen and for example. Given all this my recommendation was that we moved to a comfort/palliative approach, also called "best supportive care", and that we contact hospice to enlist their help. I also mentioned that we generally his right a DO NOT RESUSCITATE order at this point.  After very a was able to understand this and agrees to it. Germane was very distraught at the news but in the end he also seemed to agree that this is really the only option. Accordingly we are contacting hospice so they can offer true very a their services, and we are making her a return appointment with me in the near future to make sure everything is in place.  Andalyn should not be resuscitated in case of a terminal event  I personally saw this patient and performed a substantive  portion of this encounter with the listed APP documented above.   [name], MD Medical Oncology and Hematology [facility] [address] [place], [place] [address] Tel. [phone]    Fax. [phone]

## 2015-10-06 NOTE — Telephone Encounter (Signed)
VM message received from pt asking if she needs to continue to take her blood pressure medicines. TC to patient and spoke with her about her BP meds. She states her cardiologist is the one who prescribes her BP meds. Advised pt to contact her cardiologist with questions related to these medications. Pt also asking if she needs to take her 'xeloda'.   Advised pt that she had an appt today with Dr. Jana Hakim @ 10:30 but did not show.  Pt thought her appt was last Friday-for which she did not show for that appt either. Pt also asking about results of her recent scan which was done 09/30/15

## 2015-10-06 NOTE — Progress Notes (Signed)
Per Dr. Jana Hakim and Mikey Bussing, NP, I made a referral to Hospice.

## 2015-10-07 ENCOUNTER — Telehealth: Payer: Self-pay | Admitting: Oncology

## 2015-10-07 NOTE — Telephone Encounter (Signed)
s.w. pt and advised on NOV appt....pt ok and aware °

## 2015-10-08 ENCOUNTER — Encounter: Payer: Self-pay | Admitting: Oncology

## 2015-10-08 ENCOUNTER — Encounter (HOSPITAL_COMMUNITY): Payer: No Typology Code available for payment source

## 2015-10-08 NOTE — Progress Notes (Signed)
I placed prior auth form for oxycodone on desk of nurse for dr. Jana Hakim

## 2015-10-09 ENCOUNTER — Other Ambulatory Visit: Payer: Self-pay | Admitting: *Deleted

## 2015-10-09 ENCOUNTER — Telehealth: Payer: Self-pay | Admitting: *Deleted

## 2015-10-09 DIAGNOSIS — C50212 Malignant neoplasm of upper-inner quadrant of left female breast: Secondary | ICD-10-CM

## 2015-10-09 MED ORDER — FENTANYL 25 MCG/HR TD PT72: 25.0000 ug | MEDICATED_PATCH | TRANSDERMAL | Status: DC

## 2015-10-09 MED ORDER — FENTANYL 75 MCG/HR TD PT72: 75.0000 ug | MEDICATED_PATCH | TRANSDERMAL | Status: DC

## 2015-10-09 NOTE — Telephone Encounter (Signed)
This RN received message from [place] - admission nurse with [facility] - stating " pt has declined services per family request ".  This RN called Erika [name] and verified above.  Per discussion this RN also stating need for Erika [name] to contact this office if she has increased symptoms that are not controlled with home medications.  Presently Erika [name] has no new issues.  Per inquiry states she will need a refill on her fentanyl and antinausea medication.  Refills will be obtained for pt to pick up in the am.  Note pt was not in a private environment therefore discussion was limited.  Reviewed appointment scheduled for next week.

## 2015-10-10 ENCOUNTER — Ambulatory Visit (HOSPITAL_BASED_OUTPATIENT_CLINIC_OR_DEPARTMENT_OTHER): Payer: No Typology Code available for payment source

## 2015-10-10 ENCOUNTER — Other Ambulatory Visit (HOSPITAL_BASED_OUTPATIENT_CLINIC_OR_DEPARTMENT_OTHER): Payer: No Typology Code available for payment source

## 2015-10-10 ENCOUNTER — Encounter: Payer: Self-pay | Admitting: Oncology

## 2015-10-10 ENCOUNTER — Other Ambulatory Visit: Payer: Self-pay | Admitting: Nurse Practitioner

## 2015-10-10 ENCOUNTER — Other Ambulatory Visit (HOSPITAL_COMMUNITY)
Admission: AD | Admit: 2015-10-10 | Discharge: 2015-10-10 | Disposition: A | Discharge status: Home / Self Care | Payer: No Typology Code available for payment source | Source: Ambulatory Visit | Attending: Oncology | Admitting: Oncology

## 2015-10-10 ENCOUNTER — Telehealth: Payer: Self-pay | Admitting: Oncology

## 2015-10-10 DIAGNOSIS — C50212 Malignant neoplasm of upper-inner quadrant of left female breast: Secondary | ICD-10-CM | POA: Diagnosis not present

## 2015-10-10 DIAGNOSIS — I2699 Other pulmonary embolism without acute cor pulmonale: Secondary | ICD-10-CM | POA: Diagnosis present

## 2015-10-10 DIAGNOSIS — C50912 Malignant neoplasm of unspecified site of left female breast: Secondary | ICD-10-CM

## 2015-10-10 DIAGNOSIS — I82409 Acute embolism and thrombosis of unspecified deep veins of unspecified lower extremity: Secondary | ICD-10-CM

## 2015-10-10 LAB — CBC WITH DIFFERENTIAL/PLATELET
BASO%: 0.1 % (ref 0.0–2.0)
BASOS ABS: 0 10*3/uL (ref 0.0–0.1)
EOS%: 0.1 % (ref 0.0–7.0)
Eosinophils Absolute: 0 10*3/uL (ref 0.0–0.5)
HCT: 32.2 % — ABNORMAL LOW (ref 34.8–46.6)
HEMOGLOBIN: 9.8 g/dL — AB (ref 11.6–15.9)
LYMPH#: 1.6 10*3/uL (ref 0.9–3.3)
LYMPH%: 9 % — ABNORMAL LOW (ref 14.0–49.7)
MCH: 25.5 pg (ref 25.1–34.0)
MCHC: 30.4 g/dL — ABNORMAL LOW (ref 31.5–36.0)
MCV: 83.9 fL (ref 79.5–101.0)
MONO#: 1 10*3/uL — ABNORMAL HIGH (ref 0.1–0.9)
MONO%: 6 % (ref 0.0–14.0)
NEUT%: 84.8 % — ABNORMAL HIGH (ref 38.4–76.8)
NEUTROS ABS: 14.8 10*3/uL — AB (ref 1.5–6.5)
NRBC: 0 % (ref 0–0)
Platelets: 518 10*3/uL — ABNORMAL HIGH (ref 145–400)
RBC: 3.84 10*6/uL (ref 3.70–5.45)
RDW: 19.3 % — AB (ref 11.2–14.5)
WBC: 17.4 10*3/uL — AB (ref 3.9–10.3)

## 2015-10-10 LAB — PROTIME-INR
INR: 4.6 — ABNORMAL HIGH (ref 0.00–1.49)
Prothrombin Time: 42.2 seconds — ABNORMAL HIGH (ref 11.6–15.2)

## 2015-10-10 LAB — COMPREHENSIVE METABOLIC PANEL (CC13)
ALBUMIN: 1.6 g/dL — AB (ref 3.5–5.0)
ALK PHOS: 164 U/L — AB (ref 40–150)
ALT: 15 U/L (ref 0–55)
AST: 58 U/L — AB (ref 5–34)
Anion Gap: 11 mEq/L (ref 3–11)
BILIRUBIN TOTAL: 0.36 mg/dL (ref 0.20–1.20)
BUN: 14 mg/dL (ref 7.0–26.0)
CO2: 24 meq/L (ref 22–29)
Calcium: 9.5 mg/dL (ref 8.4–10.4)
Chloride: 101 mEq/L (ref 98–109)
Creatinine: 0.8 mg/dL (ref 0.6–1.1)
GLUCOSE: 112 mg/dL (ref 70–140)
Potassium: 3.6 mEq/L (ref 3.5–5.1)
SODIUM: 137 meq/L (ref 136–145)
TOTAL PROTEIN: 7.4 g/dL (ref 6.4–8.3)

## 2015-10-10 MED ORDER — ONDANSETRON HCL 8 MG PO TABS: 8.0000 mg | ORAL_TABLET | Freq: Two times a day (BID) | ORAL | Status: DC

## 2015-10-10 MED ORDER — SODIUM CHLORIDE 0.9 % IJ SOLN
10.0000 mL | INTRAMUSCULAR | Status: DC | PRN
  Administered 2015-10-10: 10 mL via INTRAVENOUS
  Filled 2015-10-10: qty 10

## 2015-10-10 MED ORDER — HEPARIN SOD (PORK) LOCK FLUSH 100 UNIT/ML IV SOLN
500.0000 [IU] | Freq: Once | INTRAVENOUS | Status: AC
  Administered 2015-10-10: 500 [IU] via INTRAVENOUS
  Filled 2015-10-10: qty 5

## 2015-10-10 MED ORDER — LORAZEPAM 0.5 MG PO TABS: 0.5000 mg | ORAL_TABLET | Freq: Four times a day (QID) | ORAL | Status: DC | PRN

## 2015-10-10 NOTE — Progress Notes (Signed)
I faxed form to [facility] for oxycodone. Dr. Completed.

## 2015-10-10 NOTE — Telephone Encounter (Signed)
lvm for pt regarding to 10.31 added lab.Marland KitchenMarland KitchenMarland KitchenMarland Kitchenpt ok and aware

## 2015-10-10 NOTE — Progress Notes (Signed)
Lab called to report that they were unable to collect enough blood after four attempts to complete C-Met.  Terri, RN to access port to obtain more blood.    INR- 4.9 and was being sent to hospital lab to get final reading.  Patient encouraged to hold coumadin today and Saturday, take regular dose coumadin 56m on Sunday .  Repeat INR on Monday 10/13/15.  LVM on cell phone.

## 2015-10-10 NOTE — Progress Notes (Signed)
Blood return obtained with patient supine on her right side.  Ativan prescription called in to [facility].  Zofran refill escribed.  Pt & spouse provided instructions on coumadin - hold today and tomorrow, 5 mg Sunday, lab check on Monday at 11 am.  They voiced understanding.

## 2015-10-13 ENCOUNTER — Other Ambulatory Visit (HOSPITAL_COMMUNITY)
Admission: RE | Admit: 2015-10-13 | Discharge: 2015-10-13 | Disposition: A | Discharge status: Home / Self Care | Payer: No Typology Code available for payment source | Source: Ambulatory Visit | Attending: Oncology | Admitting: Oncology

## 2015-10-13 ENCOUNTER — Other Ambulatory Visit: Payer: No Typology Code available for payment source

## 2015-10-13 ENCOUNTER — Encounter: Payer: Self-pay | Admitting: Oncology

## 2015-10-13 ENCOUNTER — Encounter: Payer: Self-pay | Admitting: *Deleted

## 2015-10-13 DIAGNOSIS — Z9010 Acquired absence of unspecified breast and nipple: Secondary | ICD-10-CM | POA: Insufficient documentation

## 2015-10-13 DIAGNOSIS — I82409 Acute embolism and thrombosis of unspecified deep veins of unspecified lower extremity: Secondary | ICD-10-CM

## 2015-10-13 LAB — PROTIME-INR
INR: 6.05 — AB (ref 0.00–1.49)
PROTHROMBIN TIME: 51.9 s — AB (ref 11.6–15.2)

## 2015-10-13 NOTE — Progress Notes (Unsigned)
Panic INR reported to Hinda Lenis by Prudencio Burly [phone] 10/13/15

## 2015-10-13 NOTE — Progress Notes (Signed)
Checked in pt for front staff. Pt signed AOB. Pt asked about financial assistance signs. I explained to pt it depends on financial need as to what may be available. Pt is insured and has $3 copay for medication. Pt states she was told she could come to reapply for funds later. Pt received [employer] $1000 grant in the past and has exhausted funds. Gave pt application for [employer] in [place], and [employer]. Advised pt to complete applications and bring supporting documents to her next appointment and I would look them over and send off for her. Pt states she is currently not on treatment because chemotherapy was too strong for her to tolerate and she is not doing Radiation.

## 2015-10-16 ENCOUNTER — Telehealth: Payer: Self-pay | Admitting: *Deleted

## 2015-10-16 NOTE — Telephone Encounter (Signed)
Per INR on 10/31- noted as send out with reading of 6.05.  Pt contacted same day and advised to continue to hold coumadin.  Erika [name] denies any bleeding and understands if occurs to call this office or proceed to the hospital.  Per MD pt is scheduled for visit on 11/4 and INR will be rechecked.

## 2015-10-17 ENCOUNTER — Telehealth: Payer: Self-pay | Admitting: Oncology

## 2015-10-17 ENCOUNTER — Ambulatory Visit (HOSPITAL_BASED_OUTPATIENT_CLINIC_OR_DEPARTMENT_OTHER): Payer: Medicare Other | Admitting: Oncology

## 2015-10-17 ENCOUNTER — Other Ambulatory Visit (HOSPITAL_BASED_OUTPATIENT_CLINIC_OR_DEPARTMENT_OTHER): Payer: Medicare Other

## 2015-10-17 ENCOUNTER — Other Ambulatory Visit (HOSPITAL_COMMUNITY)
Admission: AD | Admit: 2015-10-17 | Discharge: 2015-10-17 | Disposition: A | Discharge status: Home / Self Care | Payer: Medicare Other | Source: Ambulatory Visit | Attending: Oncology | Admitting: Oncology

## 2015-10-17 VITALS — BP 104/82 | HR 157 | Temp 98.3°F | Resp 16 | Ht 65.0 in | Wt 146.8 lb

## 2015-10-17 DIAGNOSIS — C7931 Secondary malignant neoplasm of brain: Secondary | ICD-10-CM | POA: Insufficient documentation

## 2015-10-17 DIAGNOSIS — C50912 Malignant neoplasm of unspecified site of left female breast: Secondary | ICD-10-CM

## 2015-10-17 DIAGNOSIS — I82409 Acute embolism and thrombosis of unspecified deep veins of unspecified lower extremity: Secondary | ICD-10-CM

## 2015-10-17 DIAGNOSIS — C50919 Malignant neoplasm of unspecified site of unspecified female breast: Secondary | ICD-10-CM | POA: Diagnosis present

## 2015-10-17 DIAGNOSIS — C50212 Malignant neoplasm of upper-inner quadrant of left female breast: Secondary | ICD-10-CM

## 2015-10-17 LAB — COMPREHENSIVE METABOLIC PANEL (CC13)
ALT: 17 U/L (ref 0–55)
AST: 68 U/L — ABNORMAL HIGH (ref 5–34)
Albumin: 1.8 g/dL — ABNORMAL LOW (ref 3.5–5.0)
Alkaline Phosphatase: 199 U/L — ABNORMAL HIGH (ref 40–150)
Anion Gap: 15 mEq/L — ABNORMAL HIGH (ref 3–11)
BILIRUBIN TOTAL: 0.46 mg/dL (ref 0.20–1.20)
BUN: 11.6 mg/dL (ref 7.0–26.0)
CHLORIDE: 91 meq/L — AB (ref 98–109)
CO2: 32 meq/L — AB (ref 22–29)
Calcium: 9.4 mg/dL (ref 8.4–10.4)
Creatinine: 0.8 mg/dL (ref 0.6–1.1)
GLUCOSE: 123 mg/dL (ref 70–140)
POTASSIUM: 2.5 meq/L — AB (ref 3.5–5.1)
SODIUM: 137 meq/L (ref 136–145)
TOTAL PROTEIN: 7.8 g/dL (ref 6.4–8.3)

## 2015-10-17 LAB — CBC WITH DIFFERENTIAL/PLATELET
BASO%: 0.7 % (ref 0.0–2.0)
Basophils Absolute: 0.1 10*3/uL (ref 0.0–0.1)
EOS ABS: 0.1 10*3/uL (ref 0.0–0.5)
EOS%: 0.3 % (ref 0.0–7.0)
HCT: 33.2 % — ABNORMAL LOW (ref 34.8–46.6)
HGB: 10.3 g/dL — ABNORMAL LOW (ref 11.6–15.9)
LYMPH%: 8.9 % — AB (ref 14.0–49.7)
MCH: 25.3 pg (ref 25.1–34.0)
MCHC: 31.1 g/dL — AB (ref 31.5–36.0)
MCV: 81.3 fL (ref 79.5–101.0)
MONO#: 0.6 10*3/uL (ref 0.1–0.9)
MONO%: 3.6 % (ref 0.0–14.0)
NEUT%: 86.5 % — AB (ref 38.4–76.8)
NEUTROS ABS: 15.3 10*3/uL — AB (ref 1.5–6.5)
Platelets: 483 10*3/uL — ABNORMAL HIGH (ref 145–400)
RBC: 4.08 10*6/uL (ref 3.70–5.45)
RDW: 21.9 % — ABNORMAL HIGH (ref 11.2–14.5)
WBC: 17.7 10*3/uL — AB (ref 3.9–10.3)
lymph#: 1.6 10*3/uL (ref 0.9–3.3)

## 2015-10-17 LAB — PROTIME-INR
INR: 4.22 — AB (ref 0.00–1.49)
PROTHROMBIN TIME: 39.5 s — AB (ref 11.6–15.2)

## 2015-10-17 MED ORDER — PREDNISONE 10 MG PO TABS: 10.0000 mg | ORAL_TABLET | Freq: Every day | ORAL | Status: DC

## 2015-10-17 NOTE — Telephone Encounter (Signed)
Pt left and said that she will come back on Monday to pick up new sched

## 2015-10-17 NOTE — Progress Notes (Signed)
Critical Labs:  INR-  Sent to hospital for confirmation             Potassium-  2.5 Dr. Jana Hakim aware of both labs.  Patient is asked not to take any coumadin today, then take 5 mg daily per Dr. Jana Hakim

## 2015-10-17 NOTE — Progress Notes (Signed)
ID: Erika [name] OB: 1971/07/26  MR#: [id number]  CSN#:[id number]  PCP: Erika Pillow, NP GYN:   SU: Dr. Donne [name] OTHER MD:  CHIEF COMPLAINT:  metastatic breast cancer, congestive heart failure  CURRENT TREATMENT: capecitabine; SRS pending  BREAST CANCER HISTORY: From Dr. Dana [name] original intake note:  "Patient developed a left sided breast mass show up about 6 months prior to diagnosis. 2 months prior this began getting larger rapidly. She was seen in an urgent care where this mass was thought to be an abscess. Apparently there was an attempt to excise or drain this that was not successful and she was referred to Dr. Donne [name] for evaluation. She underwent punch biopsy on 04/23/13 of her skin and the mass. She was then also sent for mm/us. The biopsy shows invasive high grade carcinoma that appears to be breast. It is ER 14%, PR negative, HER-2/neu negative with a Ki-67 that approaches 100%. She has also undergone mm with at least 10 cm upper inner left breast tumor with mild overyling skin thickening. There is also a 3x10 mm cluster of calcs near there also. There is indeterminate 7 mm level 1 left axillary node, she underwent a lymph node biopsy that was positive for disease. No mm evidence of right breast malignancy. An MRI of the breasts was done on 05/22/13 and showed a 14 cm mass in the left breast protruding from the skin and also a 2.5 cm level II axillary node."  Her subsequent history is as detailed below  INTERVAL HISTORY:   Erika [name] returns today for follow up of her breast cancer, accompanied by her significant other, Erika [name]. At the last visit here we've placed a hospice referral, but when they came by the patient and her family decided they really did not want that. She called and told us she was feeling like "a dog taken to the pound" so I made sure to bring her back and see what we can do to improve her functional status if nothing else.    REVIEW OF SYSTEMS:   Erika [name] main problem is fatigue. She sleeps all day. She is then up all night. She has no energy. She has no headaches, her vision is good, and she has had no problems with nausea or vomiting. She is on the fentanyl patch and breakthrough oral medication and with that her pain is very well-controlled. She has bowel movements every 2 or 3 days, sometimes with the help of laxatives. She denies cough, phlegm production or pleurisy. Despite her high INRs has been no bleeding. A detailed review of systems today was otherwise stable   PAST MEDICAL HISTORY: Past Medical History  Diagnosis Date  . Headache(784.0)     Hx: of Migraines  . Anxiety     ANXIOUS ABOUT HAVEING BIG SURGERY - MASTECTOMY  . 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 DVT (deep venous thrombosis) (HCC)     RT ARM-PT ON LOVENOX  . S/P radiation therapy 02-04-14 to 03-18-14                               1) Left Chest Wall / 50 Gy in 25 fractions/ 2) Left Supraclavicular fossa/ 46 Gy in 23 fractions / 3) Left Posterior Axillary boost / 8.533 Gy in 23 fractions/ 4) Left Chest Wall Scar boost / 10 Gy in 5 fractions  . CHF (congestive heart failure) ([place])   . Iron  deficiency anemia 11/26/13  . Cancer of left breast ([place])     Invasive High Grade Carcinoma; S/P mastectomy, Adriamycin and Cytoxan, radiation  . Metastases to the liver ([place])   . Metastasis to lung [facility])     PAST SURGICAL HISTORY: Past Surgical History  Procedure Laterality Date  . Portacath placement Right 05/22/2013    Procedure: INSERTION PORT-A-CATH;  Surgeon: [name], MD;  Location: [facility] OR;  Service: General;  Laterality: Right;  . Breast surgery Left 05/21/13; 06/22/13     Needle Core Biopsy  . Mastectomy modified radical Left 11/15/2013    Procedure: LEFT MASTECTOMY MODIFIED RADICAL;  Surgeon: [name], MD;  Location: [facility] ORS;  Service: General;  Laterality: Left;  . Tubal ligation  08/1996    FAMILY HISTORY Family History  Problem  Relation Age of Onset  . Hypertension Mother   . Diabetes Father 59  . Stomach cancer Maternal Aunt   . Hypertension Maternal Grandmother   . Hypertension Maternal Grandfather   . Diabetes Paternal Grandmother   . Diabetes Paternal Grandfather   . Diabetes Paternal Uncle   . Diabetes Paternal Uncle   . Diabetes Paternal Uncle   The patient's mother is alive, age 70. The patient's father died from complications of diabetes at the age of 1. The patient has 2 brothers, one sister. There is no history of breast or ovarian cancer in the family to her knowledge .the patient has not undergone genetic testing   GYNECOLOGIC HISTORY:  Menarche age 63, first live birth age 66. The patient is GX P3. The patient has been on goserelin since 06/13/2013, given monthly  SOCIAL HISTORY:  The patient has worked at as a Furniture conservator/restorer and a Retail buyer. She is currently applying for disability. At home she lives with her youngest son, [name], 90; with the patient's brother Jing Howatt; and with the patient's fiance [name].   ADVANCED DIRECTIVES: Not in place; at the 09/09/2015 visit the patient was given advanced directives to complete at her discretion; I suggested she bring it next visit so  it can be notarized  HEALTH MAINTENANCE: Social History  Substance Use Topics  . Smoking status: Never Smoker   . Smokeless tobacco: Never Used  . Alcohol Use: No    Allergies  Allergen Reactions  . Doxycycline Nausea And Vomiting    Current Outpatient Prescriptions  Medication Sig Dispense Refill  . cyclobenzaprine (FLEXERIL) 5 MG tablet TAKE 1 TABLET BY MOUTH THREE TIMES A DAY FOR MUSCLE SPASMS 30 tablet 1  . fentaNYL (DURAGESIC - DOSED MCG/HR) 25 MCG/HR patch Place 1 patch (25 mcg total) onto the skin every 3 (three) days. 10 patch 0  . fentaNYL (DURAGESIC - DOSED MCG/HR) 75 MCG/HR Place 1 patch (75 mcg total) onto the skin every 3 (three) days. 10 patch 0  . gabapentin (NEURONTIN) 300 MG capsule Take  1 capsule (300 mg total) by mouth 3 (three) times daily. 90 capsule 2  . loratadine (CLARITIN) 10 MG tablet Take 10 mg by mouth daily as needed for allergies.    Marland Kitchen LORazepam (ATIVAN) 0.5 MG tablet Take 1 tablet (0.5 mg total) by mouth every 6 (six) hours as needed for anxiety. 30 tablet 0  . nystatin (MYCOSTATIN) 100000 UNIT/ML suspension Take 5 mLs (500,000 Units total) by mouth 4 (four) times daily. Swish in mouth as long as possible, then swallow.  Use for 3 weeks. 473 mL 0  . ondansetron (ZOFRAN) 8 MG tablet Take 1 tablet (8 mg  total) by mouth 2 (two) times daily. 20 tablet 1  . oxyCODONE (OXY IR/ROXICODONE) 5 MG immediate release tablet Take 1-2 tablets every 8 hrs as needed for severe or breakthrough pain 180 tablet 0  . potassium chloride SA (K-DUR,KLOR-CON) 20 MEQ tablet Take 1 tablet (20 mEq total) by mouth 4 (four) times daily. 120 tablet 1  . prochlorperazine (COMPAZINE) 10 MG tablet Take 1 tablet (10 mg total) by mouth every 8 (eight) hours as needed for nausea or vomiting. 30 tablet 0  . warfarin (COUMADIN) 2 MG tablet Take 1 mg by mouth daily.    Marland Kitchen zolpidem (AMBIEN) 5 MG tablet Take 1 tablet (5 mg total) by mouth at bedtime as needed for sleep. 30 tablet 0   No current facility-administered medications for this visit.   Facility-Administered Medications Ordered in Other Visits  Medication Dose Route Frequency Provider Last Rate Last Dose  . Darbepoetin Alfa (ARANESP) injection 200 mcg  200 mcg Subcutaneous Once [name], NP      . sodium chloride 0.9 % injection 10 mL  10 mL Intravenous PRN [name], MD   10 mL at 06/19/15 1330  . topical emolient (BIAFINE) emulsion   Topical Daily [name], MD        OBJECTIVE: middle-aged African American woman examined in a wheelchair  Filed Vitals:   10/17/15 1452  BP: 104/82  Pulse: 157  Temp: 98.3 F (36.8 C)  Resp: 16     Body mass index is 24.43 kg/(m^2).      ECOG FS:2 - Symptomatic, <50% confined to bed    Sclerae unicteric, pupils round and equal Oropharynx shows no thrush, slightly dry No cervical or supraclavicular adenopathy Lungs no rales or rhonchi Heart regular rate and rhythm Abd soft, distended, nontender, positive bowel sounds MSK no focal spinal tenderness, no upper extremity lymphedema Neuro: nonfocal, well oriented, appropriate affect Breasts: Deferred   LAB RESULTS:  CMP     Component Value Date/Time   NA 137 10/10/2015 1141   NA 138 09/06/2015 1310   K 3.6 10/10/2015 1141   K 4.1 09/06/2015 1310   CL 106 09/06/2015 1310   CL 102 06/01/2013 1500   CO2 24 10/10/2015 1141   CO2 23 09/06/2015 1310   GLUCOSE 112 10/10/2015 1141   GLUCOSE 93 09/06/2015 1310   GLUCOSE 95 06/01/2013 1500   BUN 14.0 10/10/2015 1141   BUN 21* 09/06/2015 1310   CREATININE 0.8 10/10/2015 1141   CREATININE 0.95 09/06/2015 1310   CALCIUM 9.5 10/10/2015 1141   CALCIUM 9.4 09/06/2015 1310   PROT 7.4 10/10/2015 1141   PROT 9.1* 09/06/2015 1310   ALBUMIN 1.6* 10/10/2015 1141   ALBUMIN 2.8* 09/06/2015 1310   AST 58* 10/10/2015 1141   AST 36 09/06/2015 1310   ALT 15 10/10/2015 1141   ALT 11* 09/06/2015 1310   ALKPHOS 164* 10/10/2015 1141   ALKPHOS 136* 09/06/2015 1310   BILITOT 0.36 10/10/2015 1141   BILITOT 0.4 09/06/2015 1310   GFRNONAA >60 09/06/2015 1310   GFRAA >60 09/06/2015 1310    I No results found for: SPEP  Lab Results  Component Value Date   WBC 17.7* 10/17/2015   NEUTROABS 15.3* 10/17/2015   HGB 10.3* 10/17/2015   HCT 33.2* 10/17/2015   MCV 81.3 10/17/2015   PLT 483* 10/17/2015      Chemistry      Component Value Date/Time   NA 137 10/10/2015 1141   NA 138 09/06/2015 1310  K 3.6 10/10/2015 1141   K 4.1 09/06/2015 1310   CL 106 09/06/2015 1310   CL 102 06/01/2013 1500   CO2 24 10/10/2015 1141   CO2 23 09/06/2015 1310   BUN 14.0 10/10/2015 1141   BUN 21* 09/06/2015 1310   CREATININE 0.8 10/10/2015 1141   CREATININE 0.95 09/06/2015 1310       Component Value Date/Time   CALCIUM 9.5 10/10/2015 1141   CALCIUM 9.4 09/06/2015 1310   ALKPHOS 164* 10/10/2015 1141   ALKPHOS 136* 09/06/2015 1310   AST 58* 10/10/2015 1141   AST 36 09/06/2015 1310   ALT 15 10/10/2015 1141   ALT 11* 09/06/2015 1310   BILITOT 0.36 10/10/2015 1141   BILITOT 0.4 09/06/2015 1310       Lab Results  Component Value Date   LABCA2 35 05/18/2013    No components found for: [id number]   Recent Labs Lab 10/17/15 1429  INR Sent out for confirmation. See results in EPIC.    Urinalysis    Component Value Date/Time   COLORURINE YELLOW 09/06/2015 1415    STUDIES: Nm Pet Image Restag (ps) Skull Base To Thigh  09/30/2015  CLINICAL DATA:  Subsequent treatment strategy for metastatic breast cancer. Restaging examination after treatment. EXAM: NUCLEAR MEDICINE PET SKULL BASE TO THIGH TECHNIQUE: 7.4 mCi mCi F-18 FDG was injected intravenously. Full-ring PET imaging was performed from the skull base to thigh after the radiotracer. CT data was obtained and used for attenuation correction and anatomic localization. FASTING BLOOD GLUCOSE:  Value: 117 mg/dl COMPARISON:  PET-CT 06/13/2013. CT of the chest, abdomen and pelvis 05/01/2015. FINDINGS: NECK No hypermetabolic lymph nodes in the neck. CHEST Significant interval increase and color oral thickening and nodularity, with multiple mass-like areas which are diffusely hypermetabolic (SUVmax = 74.0). Multiple pulmonary nodules are also noted in the lungs bilaterally, largest of which is in the left lower lobe measuring 1.5 cm. These are all hypermetabolic, compatible with metastatic lesions, and they have significantly increased in number and size compared to the prior examination. Extensive hypermetabolic lymphadenopathy is noted throughout the mediastinum, most evident in the subcarinal nodal station where there is a hypermetabolic (SUVmax = 81)44 mm short axis lymph node, and a 15 mm low left paratracheal lymph node  that is hypermetabolic (SUVmax = 81.8). Right subclavian single-lumen porta cath with tip terminating in the distal superior vena cava. Status post left modified radical mastectomy and left axillary nodal dissection. No soft tissue mass along the left chest wall to suggest local recurrence of disease. ABDOMEN/PELVIS Significant interval enlargement of a large hypermetabolic (SUVmax = 56.3)JSHFWY centered in the right lobe of the liver which measures approximately 6.8 cm (image 104 of series 4). Several other small hypo attenuating hypermetabolic liver lesions are also noted, new compared to prior studies, compatible with progressive metastatic disease to the liver. No abnormal hypermetabolic activity within the pancreas, adrenal glands, or spleen. Calcified gallstones lying dependently in the gallbladder. No findings to suggest an acute cholecystitis at this time. Mildly enlarged 1 cm short axis upper abdominal lymph node adjacent to the crus of the right hemidiaphragm (image 100 of series 4) is hypermetabolic (SUVmax = 7.2). SKELETON Scattered soft tissue nodules are noted in the subcutaneous fat, largest of which measures 12 mm in the left flank soft tissues (image 117 of series 4) are hypermetabolic (SUVmax = 8.4), compatible with metastatic lesions. Subtle area of osteolysis in the left ilium demonstrates hypermetabolism (SUVmax = 6.6), concerning for skeletal metastasis. IMPRESSION: 1.  Today's study demonstrates progression of widespread metastatic disease throughout the right pleural space, the lungs bilaterally, mediastinal nodal stations, liver, upper abdominal lymph nodes, body wall soft tissues, and the bones, as detailed above. 2. Cholelithiasis without evidence of acute cholecystitis at this time. 3. Additional incidental findings, as above. Electronically Signed   By: Vinnie Langton M.D.   On: 09/30/2015 15:54    ASSESSMENT: 44 y.o.  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  (0) genetic testing of the ATM, BARD1, BRCA1, BRCA2, BRIP1, CDH1, CHEK2, EPCAM, FANCC, MLH1, MSH2, MSH6, NBN, PALB2, PMS2, PTEN, RAD51C, RAD51D, STK11, TP53, and XRCC2 genes showed no deleterious mutations  (1) status post left breast biopsy 04/23/2013 for a clinical T4 N1, stage IIIB invasive ductal carcinoma, grade 3,  estrogen receptor 14% positive with moderate staining intensity, progesterone receptor negative,  with an MIB-1 of 100% and HER-2/neu  nonamplified  (2) biopsy of a left axillary lymph node [phone] was positive  (3) started monthly goserelin July of 02/01/2013  (4)  received  neoadjuvant chemotherapy with doxorubicin and cyclophosphamide in dose dense fashion x4, followed by carboplatin and paclitaxel x1, discontinued due to neuropathy, followed by carboplatin and gemcitabine, both given day 1 of each 14 day cycle x6, completed 10/12/2013 .   (5) right upper extremity DVT documented 06/29/2013,  treated with Lovenox from July  through December 2014    (6) status post left modified radical mastectomy 11/15/2013 for a residual pT3 pN0 invasive ductal carcinoma, grade 3, with repeat prognostic panel triple negative. All 12 axillary lymph nodes were clear. Margins were negative   (5) completed adjuvant radiation therapy 03/18/2014,  with capecitabine added 2-3 weeks into treatment.  METASTATIC DISEASE  (6) CT scans of the chest, abdomen, and pelvis 06/26/2014 showed a large right pleural effusion  with pleural studding, 2 left upper lobe nodules, and two  liver lesions. Cytology from right thoracentesis 07/03/2014 showed malignant cells consistent with the patient's known primary  (7) started eribulin 07/09/2014, given days 1 and 8 of each 21 day cycle; restagind studies after cycle 3 showed evidence of response. Discontinued after cycle 5 (last dose 10/08/14) because of worsening peripheral neuropathy--also with progression.   (8) referral to 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 Antithrombin level was normal.  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 She is seeking a second opinion at [facility] and I reassured her that that is something many patients do there is nothing wrong with it and  that we will be glad to help and cooperate with the doctors there if we can be of any assistance.  In the meantime I suggested again that she should be under care of hospice but she does not want that. I also proposed in home palliative care but she thinks she does not need any further assistance than she has from her daughter and Brenton Grills.  We then talked about her INR, which remains elevated. She has been on 5 mg daily. I told her not to take any today and then start 2.5 tomorrow. She will be at [facility] early next week and they could check  the INR there. If they don't then she needs to have it checked some time next week in this office.  She has cut down her potassium to twice daily. Her potassium level is now quite low. I told her to go back to 4 times a day and to take 2 tablets as soon as she gets home today  I think she would do better if she took some steroids. She does not have a history of diabetes. I'm starting her prednisone 10 mg in the morning. I emphasize the importance of her particularly hydrating herself and I suggested she drink 2 quarts a day of any fluid she likes. She can fill age are or container for 1 quart in the morning and make sure she has drunk that much by midday. She can then work on the rest of it for the rest of the day. As far as food is concerned she is going for acidic foods like it drops supra. We discussed adding a little bit of vinegar or little bit of lemon juice to food and see if that helps her appetite.  Otherwise she will return here on November 18. She knows to call for any problems that may develop before that visit.  [name], MD 10/17/2015 3:00 PM    Medical Oncology and Hematology [facility] [address] [place], [place] [address] Tel. [phone]    Fax. [phone]

## 2015-10-20 ENCOUNTER — Encounter: Payer: Self-pay | Admitting: Oncology

## 2015-10-20 ENCOUNTER — Other Ambulatory Visit: Payer: Self-pay | Admitting: Oncology

## 2015-10-20 NOTE — Progress Notes (Signed)
I refaxed nctraks  oxycondone request

## 2015-10-21 ENCOUNTER — Ambulatory Visit (HOSPITAL_COMMUNITY)
Admission: RE | Admit: 2015-10-21 | Discharge: 2015-10-21 | Disposition: A | Discharge status: Home / Self Care | Payer: Medicare Other | Source: Ambulatory Visit | Attending: Oncology | Admitting: Oncology

## 2015-10-21 ENCOUNTER — Ambulatory Visit (HOSPITAL_BASED_OUTPATIENT_CLINIC_OR_DEPARTMENT_OTHER): Payer: Medicare Other

## 2015-10-21 ENCOUNTER — Other Ambulatory Visit: Payer: Self-pay

## 2015-10-21 ENCOUNTER — Ambulatory Visit: Payer: Self-pay

## 2015-10-21 ENCOUNTER — Ambulatory Visit (HOSPITAL_BASED_OUTPATIENT_CLINIC_OR_DEPARTMENT_OTHER): Payer: Medicare Other | Admitting: Oncology

## 2015-10-21 ENCOUNTER — Telehealth: Payer: Self-pay | Admitting: Oncology

## 2015-10-21 VITALS — BP 95/66 | HR 131 | Temp 97.4°F | Resp 18

## 2015-10-21 DIAGNOSIS — J90 Pleural effusion, not elsewhere classified: Secondary | ICD-10-CM | POA: Diagnosis not present

## 2015-10-21 DIAGNOSIS — C50212 Malignant neoplasm of upper-inner quadrant of left female breast: Secondary | ICD-10-CM

## 2015-10-21 DIAGNOSIS — Z853 Personal history of malignant neoplasm of breast: Secondary | ICD-10-CM | POA: Diagnosis not present

## 2015-10-21 DIAGNOSIS — C50019 Malignant neoplasm of nipple and areola, unspecified female breast: Secondary | ICD-10-CM

## 2015-10-21 DIAGNOSIS — C50919 Malignant neoplasm of unspecified site of unspecified female breast: Secondary | ICD-10-CM

## 2015-10-21 DIAGNOSIS — R0602 Shortness of breath: Secondary | ICD-10-CM | POA: Insufficient documentation

## 2015-10-21 DIAGNOSIS — R531 Weakness: Secondary | ICD-10-CM | POA: Insufficient documentation

## 2015-10-21 DIAGNOSIS — N63 Unspecified lump in breast: Secondary | ICD-10-CM | POA: Insufficient documentation

## 2015-10-21 DIAGNOSIS — I823 Embolism and thrombosis of renal vein: Secondary | ICD-10-CM

## 2015-10-21 DIAGNOSIS — C7931 Secondary malignant neoplasm of brain: Secondary | ICD-10-CM | POA: Diagnosis not present

## 2015-10-21 LAB — PROTIME-INR
INR: 2 (ref 2.00–3.50)
PROTIME: 24 s — AB (ref 10.6–13.4)

## 2015-10-21 NOTE — Progress Notes (Signed)
INR Today: 2.0 Present Dose: Nothing the last two days Per Dr. Jana Hakim: 2.5 mg of coumadin on even number days starting today- 10/21/15 Patient here and stated understanding.

## 2015-10-21 NOTE — Progress Notes (Signed)
ID: Erika [name] OB: May 19, 1971  MR#: [id number]  CSN#:[id number]  PCP: Erika Pillow, NP GYN:   SU: Erika Bookbinder MD OTHER MD: Erika Matte MD  CHIEF COMPLAINT:  metastatic breast cancer, congestive heart failure  CURRENT TREATMENT: observation  BREAST CANCER HISTORY: From Dr. Dana [name] original intake note:  "Patient developed a left sided breast mass show up about 6 months prior to diagnosis. 2 months prior this began getting larger rapidly. She was seen in an urgent care where this mass was thought to be an abscess. Apparently there was an attempt to excise or drain this that was not successful and she was referred to Dr. [name] for evaluation. She underwent punch biopsy on 04/23/13 of her skin and the mass. She was then also sent for mm/us. The biopsy shows invasive high grade carcinoma that appears to be breast. It is ER 14%, PR negative, HER-2/neu negative with a Ki-67 that approaches 100%. She has also undergone mm with at least 10 cm upper inner left breast tumor with mild overyling skin thickening. There is also a 3x10 mm cluster of calcs near there also. There is indeterminate 7 mm level 1 left axillary node, she underwent a lymph node biopsy that was positive for disease. No mm evidence of right breast malignancy. An MRI of the breasts was done on 05/22/13 and showed a 14 cm mass in the left breast protruding from the skin and also a 2.5 cm level II axillary node."  Her subsequent history is as detailed below  INTERVAL HISTORY:   Erika [name] returns today for an unscheduled visit accompanied by her significant other, Jermaine. They met with Dr. Shary [name] at [facility] yesterday and I have reviewed his note in [place]. They also brought a written note that mentions Abraxane, multi-gene panel testing and consideration of biopsy for HER-2 and estrogen myeloma markers in the metastatic setting.   REVIEW OF SYSTEMS:  Fiona is very tired today. She has been  sleeping "all the time" and her vision currently is poor she says. She wanted to do some "research" on Abraxane but has not been able to. She denies headaches, nausea, vomiting, or seizure activity. She denies cough phlegm production or pleurisy. Has been no fever or bleeding. She has been off warfarin now for approximately one week and came in today to have her INR checked but wanted to stop and discuss this suggestions made yesterday at [facility]. Her pain continues to be well-controlled on her current medications. She denies constipation. A detailed review of systems was otherwise stable  PAST MEDICAL HISTORY: Past Medical History  Diagnosis Date  . Headache(784.0)     Hx: of Migraines  . Anxiety     ANXIOUS ABOUT HAVEING BIG SURGERY - MASTECTOMY  . 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 DVT (deep venous thrombosis) (HCC)     RT ARM-PT ON LOVENOX  . S/P radiation therapy 02-04-14 to 03-18-14                               1) Left Chest Wall / 50 Gy in 25 fractions/ 2) Left Supraclavicular fossa/ 46 Gy in 23 fractions / 3) Left Posterior Axillary boost / 8.533 Gy in 23 fractions/ 4) Left Chest Wall Scar boost / 10 Gy in 5 fractions  . CHF (congestive heart failure) ([place])   . Iron deficiency anemia 11/26/13  . Cancer of left breast (  [place])     Invasive High Grade Carcinoma; S/P mastectomy, Adriamycin and Cytoxan, radiation  . Metastases to the liver ([place])   . Metastasis to lung [facility])     PAST SURGICAL HISTORY: Past Surgical History  Procedure Laterality Date  . Portacath placement Right 05/22/2013    Procedure: INSERTION PORT-A-CATH;  Surgeon: Erika Bookbinder, MD;  Location: [facility] OR;  Service: General;  Laterality: Right;  . Breast surgery Left 05/21/13; 06/22/13     Needle Core Biopsy  . Mastectomy modified radical Left 11/15/2013    Procedure: LEFT MASTECTOMY MODIFIED RADICAL;  Surgeon: Erika Bookbinder, MD;  Location: [facility] ORS;  Service: General;  Laterality: Left;  . Tubal  ligation  08/1996    FAMILY HISTORY Family History  Problem Relation Age of Onset  . Hypertension Mother   . Diabetes Father 37  . Stomach cancer Maternal Aunt   . Hypertension Maternal Grandmother   . Hypertension Maternal Grandfather   . Diabetes Paternal Grandmother   . Diabetes Paternal Grandfather   . Diabetes Paternal Uncle   . Diabetes Paternal Uncle   . Diabetes Paternal Uncle   The patient's mother is alive, age 5. The patient's father died from complications of diabetes at the age of 93. The patient has 2 brothers, one sister. There is no history of breast or ovarian cancer in the family to her knowledge .the patient has not undergone genetic testing   GYNECOLOGIC HISTORY:  Menarche age 86, first live birth age 35. The patient is GX P3. The patient has been on goserelin since 06/13/2013, given monthly  SOCIAL HISTORY:  The patient has worked at as a Furniture conservator/restorer and a Retail buyer. She is currently applying for disability. At home she lives with her youngest son, Erika [name], 41; with [name]; and with [name].   ADVANCED DIRECTIVES: Not in place; at the 09/09/2015 visit the patient was given advanced directives to complete at her discretion; I suggested she bring it next visit so  it can be notarized  HEALTH MAINTENANCE: Social History  Substance Use Topics  . Smoking status: Never Smoker   . Smokeless tobacco: Never Used  . Alcohol Use: No    Allergies  Allergen Reactions  . Doxycycline Nausea And Vomiting    Current Outpatient Prescriptions  Medication Sig Dispense Refill  . cyclobenzaprine (FLEXERIL) 5 MG tablet TAKE 1 TABLET BY MOUTH THREE TIMES A DAY FOR MUSCLE SPASMS 30 tablet 1  . fentaNYL (DURAGESIC - DOSED MCG/HR) 25 MCG/HR patch Place 1 patch (25 mcg total) onto the skin every 3 (three) days. 10 patch 0  . fentaNYL (DURAGESIC - DOSED MCG/HR) 75 MCG/HR Place 1 patch (75 mcg total) onto the skin every 3 (three)  days. 10 patch 0  . gabapentin (NEURONTIN) 300 MG capsule Take 1 capsule (300 mg total) by mouth 3 (three) times daily. 90 capsule 2  . loratadine (CLARITIN) 10 MG tablet Take 10 mg by mouth daily as needed for allergies.    Marland Kitchen LORazepam (ATIVAN) 0.5 MG tablet Take 1 tablet (0.5 mg total) by mouth every 6 (six) hours as needed for anxiety. 30 tablet 0  . nystatin (MYCOSTATIN) 100000 UNIT/ML suspension Take 5 mLs (500,000 Units total) by mouth 4 (four) times daily. Swish in mouth as long as possible, then swallow.  Use for 3 weeks. 473 mL 0  . ondansetron (ZOFRAN) 8 MG tablet Take 1 tablet (8 mg total) by mouth 2 (two) times daily. 20 tablet  1  . oxyCODONE (OXY IR/ROXICODONE) 5 MG immediate release tablet Take 1-2 tablets every 8 hrs as needed for severe or breakthrough pain 180 tablet 0  . potassium chloride SA (K-DUR,KLOR-CON) 20 MEQ tablet Take 1 tablet (20 mEq total) by mouth 4 (four) times daily. 120 tablet 1  . predniSONE (DELTASONE) 10 MG tablet Take 1 tablet (10 mg total) by mouth daily with breakfast. 90 tablet 3  . prochlorperazine (COMPAZINE) 10 MG tablet Take 1 tablet (10 mg total) by mouth every 8 (eight) hours as needed for nausea or vomiting. 30 tablet 0  . warfarin (COUMADIN) 2 MG tablet Take 1 mg by mouth daily.    Marland Kitchen zolpidem (AMBIEN) 5 MG tablet Take 1 tablet (5 mg total) by mouth at bedtime as needed for sleep. 30 tablet 0   No current facility-administered medications for this visit.   Facility-Administered Medications Ordered in Other Visits  Medication Dose Route Frequency Provider Last Rate Last Dose  . Darbepoetin Alfa (ARANESP) injection 200 mcg  200 mcg Subcutaneous Once [name], NP      . sodium chloride 0.9 % injection 10 mL  10 mL Intravenous PRN [name], MD   10 mL at 06/19/15 1330  . topical emolient (BIAFINE) emulsion   Topical Daily [name], MD        OBJECTIVE: middle-aged African American woman examined in a wheelchair  Filed Vitals:    10/21/15 1542  BP: 95/66  Pulse: 131  Temp: 97.4 F (36.3 C)  Resp: 18     There is no weight on file to calculate BMI.      ECOG FS:3 - Symptomatic, >50% confined to bed   Sclerae unicteric, pupils round and equal, EOMs intact Oropharynx shows no thrush or other lesions No cervical or supraclavicular adenopathy Lungs show diminished breath sounds on the right, few rales left base Heart rapid rate rate, regular rhythm Abd soft, distended, nontender, positive bowel sounds MSK no focal spinal tenderness, no upper extremity lymphedema Neuro: nonfocal, well oriented, appropriate affect Breasts: Deferred   LAB RESULTS:  CMP     Component Value Date/Time   NA 137 10/17/2015 1429   NA 138 09/06/2015 1310   K 2.5* 10/17/2015 1429   K 4.1 09/06/2015 1310   CL 106 09/06/2015 1310   CL 102 06/01/2013 1500   CO2 32* 10/17/2015 1429   CO2 23 09/06/2015 1310   GLUCOSE 123 10/17/2015 1429   GLUCOSE 93 09/06/2015 1310   GLUCOSE 95 06/01/2013 1500   BUN 11.6 10/17/2015 1429   BUN 21* 09/06/2015 1310   CREATININE 0.8 10/17/2015 1429   CREATININE 0.95 09/06/2015 1310   CALCIUM 9.4 10/17/2015 1429   CALCIUM 9.4 09/06/2015 1310   PROT 7.8 10/17/2015 1429   PROT 9.1* 09/06/2015 1310   ALBUMIN 1.8* 10/17/2015 1429   ALBUMIN 2.8* 09/06/2015 1310   AST 68* 10/17/2015 1429   AST 36 09/06/2015 1310   ALT 17 10/17/2015 1429   ALT 11* 09/06/2015 1310   ALKPHOS 199* 10/17/2015 1429   ALKPHOS 136* 09/06/2015 1310   BILITOT 0.46 10/17/2015 1429   BILITOT 0.4 09/06/2015 1310   GFRNONAA >60 09/06/2015 1310   GFRAA >60 09/06/2015 1310    I No results found for: SPEP  Lab Results  Component Value Date   WBC 17.7* 10/17/2015   NEUTROABS 15.3* 10/17/2015   HGB 10.3* 10/17/2015   HCT 33.2* 10/17/2015   MCV 81.3 10/17/2015   PLT 483* 10/17/2015  Chemistry      Component Value Date/Time   NA 137 10/17/2015 1429   NA 138 09/06/2015 1310   K 2.5* 10/17/2015 1429   K 4.1  09/06/2015 1310   CL 106 09/06/2015 1310   CL 102 06/01/2013 1500   CO2 32* 10/17/2015 1429   CO2 23 09/06/2015 1310   BUN 11.6 10/17/2015 1429   BUN 21* 09/06/2015 1310   CREATININE 0.8 10/17/2015 1429   CREATININE 0.95 09/06/2015 1310      Component Value Date/Time   CALCIUM 9.4 10/17/2015 1429   CALCIUM 9.4 09/06/2015 1310   ALKPHOS 199* 10/17/2015 1429   ALKPHOS 136* 09/06/2015 1310   AST 68* 10/17/2015 1429   AST 36 09/06/2015 1310   ALT 17 10/17/2015 1429   ALT 11* 09/06/2015 1310   BILITOT 0.46 10/17/2015 1429   BILITOT 0.4 09/06/2015 1310       Lab Results  Component Value Date   LABCA2 35 05/18/2013    No components found for: [id number]   Recent Labs Lab 10/21/15 1604  INR 2.00    Urinalysis    Component Value Date/Time   COLORURINE YELLOW 09/06/2015 1415    STUDIES: Dg Chest 2 View  10/21/2015  CLINICAL DATA:  Sob, weakness, hx breast cancer, possibly for a thoracentesis if free fluid is present EXAM: CHEST  2 VIEW COMPARISON:  08/17/2015 FINDINGS: Cardiac contours are obscured by significant right-sided pleural effusion. Right-sided PowerPort tip overlies the level of the superior vena cava, unchanged. Right lung opacity is significantly. Small amount of aerated lung persists in the apex. Nodular appearance of pleura at the lung apex, consistent with metastatic disease. Numerous metastases again identified throughout the left lung. Previous left mastectomy and axillary node dissection. Note is made of mottled appearance of right lateral ribs suggestive of metastatic disease. IMPRESSION: 1. Significant increase in right-sided opacity. Considerations include increased pleural effusion or progression of metastatic disease. 2. Further evaluation is CT of the chest is recommended if thoracentesis is being considered. 3. Nodular appearance of the pleura consistent with metastatic disease. Electronically Signed   By: Nolon Nations M.D.   On: 10/21/2015 17:06    Nm Pet Image Restag (ps) Skull Base To Thigh  09/30/2015  CLINICAL DATA:  Subsequent treatment strategy for metastatic breast cancer. Restaging examination after treatment. EXAM: NUCLEAR MEDICINE PET SKULL BASE TO THIGH TECHNIQUE: 7.4 mCi mCi F-18 FDG was injected intravenously. Full-ring PET imaging was performed from the skull base to thigh after the radiotracer. CT data was obtained and used for attenuation correction and anatomic localization. FASTING BLOOD GLUCOSE:  Value: 117 mg/dl COMPARISON:  PET-CT 06/13/2013. CT of the chest, abdomen and pelvis 05/01/2015. FINDINGS: NECK No hypermetabolic lymph nodes in the neck. CHEST Significant interval increase and color oral thickening and nodularity, with multiple mass-like areas which are diffusely hypermetabolic (SUVmax = 72.5). Multiple pulmonary nodules are also noted in the lungs bilaterally, largest of which is in the left lower lobe measuring 1.5 cm. These are all hypermetabolic, compatible with metastatic lesions, and they have significantly increased in number and size compared to the prior examination. Extensive hypermetabolic lymphadenopathy is noted throughout the mediastinum, most evident in the subcarinal nodal station where there is a hypermetabolic (SUVmax = 36)64 mm short axis lymph node, and a 15 mm low left paratracheal lymph node that is hypermetabolic (SUVmax = 40.3). Right subclavian single-lumen porta cath with tip terminating in the distal superior vena cava. Status post left modified radical mastectomy and left  axillary nodal dissection. No soft tissue mass along the left chest wall to suggest local recurrence of disease. ABDOMEN/PELVIS Significant interval enlargement of a large hypermetabolic (SUVmax = 26.3)ZCHYIF centered in the right lobe of the liver which measures approximately 6.8 cm (image 104 of series 4). Several other small hypo attenuating hypermetabolic liver lesions are also noted, new compared to prior studies, compatible  with progressive metastatic disease to the liver. No abnormal hypermetabolic activity within the pancreas, adrenal glands, or spleen. Calcified gallstones lying dependently in the gallbladder. No findings to suggest an acute cholecystitis at this time. Mildly enlarged 1 cm short axis upper abdominal lymph node adjacent to the crus of the right hemidiaphragm (image 100 of series 4) is hypermetabolic (SUVmax = 7.2). SKELETON Scattered soft tissue nodules are noted in the subcutaneous fat, largest of which measures 12 mm in the left flank soft tissues (image 117 of series 4) are hypermetabolic (SUVmax = 8.4), compatible with metastatic lesions. Subtle area of osteolysis in the left ilium demonstrates hypermetabolism (SUVmax = 6.6), concerning for skeletal metastasis. IMPRESSION: 1. Today's study demonstrates progression of widespread metastatic disease throughout the right pleural space, the lungs bilaterally, mediastinal nodal stations, liver, upper abdominal lymph nodes, body wall soft tissues, and the bones, as detailed above. 2. Cholelithiasis without evidence of acute cholecystitis at this time. 3. Additional incidental findings, as above. Electronically Signed   By: Vinnie Langton M.D.   On: 09/30/2015 15:54    ASSESSMENT: 44 y.o.  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  (0) genetic testing of the ATM, BARD1, BRCA1, BRCA2, BRIP1, CDH1, CHEK2, EPCAM, FANCC, MLH1, MSH2, MSH6, NBN, PALB2, PMS2, PTEN, RAD51C, RAD51D, STK11, TP53, and XRCC2 genes showed no deleterious mutations  (1) status post left breast biopsy 04/23/2013 for a clinical T4 N1, stage IIIB invasive ductal carcinoma, grade 3,  estrogen receptor 14% positive with moderate staining intensity, progesterone receptor negative,  with an MIB-1 of 100% and HER-2/neu  nonamplified  (2) biopsy of a left axillary lymph node [phone] was positive  (3) started monthly goserelin July of 02/01/2013  (4)  received   neoadjuvant chemotherapy with doxorubicin and cyclophosphamide in dose dense fashion x4, followed by carboplatin and paclitaxel x1, discontinued due to neuropathy, followed by carboplatin and gemcitabine, both given day 1 of each 14 day cycle x6, completed 10/12/2013 .   (5) right upper extremity DVT documented 06/29/2013,  treated with Lovenox from July  through December 2014    (6) status post left modified radical mastectomy 11/15/2013 for a residual pT3 pN0 invasive ductal carcinoma, grade 3, with repeat prognostic panel triple negative. All 12 axillary lymph nodes were clear. Margins were negative   (5) completed adjuvant radiation therapy 03/18/2014,  with capecitabine added 2-3 weeks into treatment.  METASTATIC DISEASE  (6) CT scans of the chest, abdomen, and pelvis 06/26/2014 showed a large right pleural effusion  with pleural studding, 2 left upper lobe nodules, and two  liver lesions. Cytology from right thoracentesis 07/03/2014 showed malignant cells consistent with the patient's known primary  (7) started eribulin 07/09/2014, given days 1 and 8 of each 21 day cycle; restagind studies after cycle 3 showed evidence of response. Discontinued after cycle 5 (last dose 10/08/14) because of worsening peripheral neuropathy--also with progression.   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 Antithrombin level was normal.  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 I have set her up for a chest x-ray to see if we have free-flowing fluid that could be easily tapped. I would be the easiest way to obtain tissue for the prognostic panel. This is pending. However she was very reluctant to undergo thoracentesis again and hates the idea of the "needle".   We also considered the suggestion regarding multi-Gene panel testing, but the patient had the breast/ovarian cancer panel through [facility] (detailed above) and I think Dr. Shary [name] did not have the data on hand.  The real issue is whether to proceed to chemotherapy. We discussed Abraxane in detail. Jaydence understands this is a form of Taxol that is much easier on the patient then of the version of Taxol she received previously, which she tolerated poorly. Dr. Shary [name] discussed possible toxicities, side effects and complications with her and she is aware of the risk of worsening neuropathy, low blood counts with risk of infection and bleeding, and generally worsening her already very compromised functional status. She really could not make a decision regarding that today but I have tentatively put her down for Abraxane when she returns to see me November 18.  We also again discussed the possibility of hospice and the possibility of in home palliative care. Brion refuses these and tells me if she gets any worse her sister will take care of her.  If the chest  x-ray shows significant fluid we will call her and see if she will be agreeable to thoracentesis, which in any case may be therapeutic for her. We do not have a post SRS MRI but could obtain this before the May 18 visit.  Given her sensitivity to Coumadin at this point I suggested she take 2.5 mg every other day.  She knows to call for any problems that may develop before her next visit here.  [name], MD 10/21/2015 5:52 PM    Medical Oncology and Hematology [facility] [address] [place], [place] [address] Tel. [phone]    Fax. [phone]

## 2015-10-21 NOTE — Telephone Encounter (Signed)
Appointments made and avs pritned for patient °

## 2015-10-22 ENCOUNTER — Other Ambulatory Visit: Payer: Self-pay

## 2015-10-22 DIAGNOSIS — C7931 Secondary malignant neoplasm of brain: Secondary | ICD-10-CM

## 2015-10-22 DIAGNOSIS — C50919 Malignant neoplasm of unspecified site of unspecified female breast: Secondary | ICD-10-CM

## 2015-10-22 DIAGNOSIS — C50212 Malignant neoplasm of upper-inner quadrant of left female breast: Secondary | ICD-10-CM

## 2015-10-23 ENCOUNTER — Other Ambulatory Visit: Payer: Self-pay | Admitting: Oncology

## 2015-10-23 ENCOUNTER — Ambulatory Visit (HOSPITAL_COMMUNITY)
Admission: RE | Admit: 2015-10-23 | Discharge: 2015-10-23 | Disposition: A | Discharge status: Home / Self Care | Payer: Medicare Other | Source: Ambulatory Visit | Attending: Oncology | Admitting: Oncology

## 2015-10-23 ENCOUNTER — Ambulatory Visit (HOSPITAL_COMMUNITY): Payer: No Typology Code available for payment source

## 2015-10-23 DIAGNOSIS — R918 Other nonspecific abnormal finding of lung field: Secondary | ICD-10-CM | POA: Diagnosis present

## 2015-10-23 DIAGNOSIS — C7931 Secondary malignant neoplasm of brain: Secondary | ICD-10-CM

## 2015-10-23 DIAGNOSIS — C50212 Malignant neoplasm of upper-inner quadrant of left female breast: Secondary | ICD-10-CM

## 2015-10-23 DIAGNOSIS — C50919 Malignant neoplasm of unspecified site of unspecified female breast: Secondary | ICD-10-CM

## 2015-10-23 NOTE — Progress Notes (Signed)
Patient presented today for US guided thoracentesis, upon reviewing images no pleural effusion was seen and procedure was cancelled.   Tsosie Billing PA-C Interventional Radiology  10/23/15  1:05 PM

## 2015-10-30 ENCOUNTER — Other Ambulatory Visit: Payer: Self-pay | Admitting: *Deleted

## 2015-10-30 ENCOUNTER — Other Ambulatory Visit: Payer: Self-pay | Admitting: Oncology

## 2015-10-30 ENCOUNTER — Other Ambulatory Visit: Payer: Self-pay

## 2015-10-30 ENCOUNTER — Ambulatory Visit (HOSPITAL_COMMUNITY): Admission: RE | Admit: 2015-10-30 | Payer: No Typology Code available for payment source | Source: Ambulatory Visit

## 2015-10-30 DIAGNOSIS — C7931 Secondary malignant neoplasm of brain: Secondary | ICD-10-CM

## 2015-10-30 DIAGNOSIS — C50212 Malignant neoplasm of upper-inner quadrant of left female breast: Secondary | ICD-10-CM

## 2015-10-30 DIAGNOSIS — C50919 Malignant neoplasm of unspecified site of unspecified female breast: Secondary | ICD-10-CM

## 2015-10-31 ENCOUNTER — Other Ambulatory Visit (HOSPITAL_BASED_OUTPATIENT_CLINIC_OR_DEPARTMENT_OTHER): Payer: Medicare Other

## 2015-10-31 ENCOUNTER — Other Ambulatory Visit: Payer: No Typology Code available for payment source | Admitting: *Deleted

## 2015-10-31 ENCOUNTER — Ambulatory Visit (HOSPITAL_BASED_OUTPATIENT_CLINIC_OR_DEPARTMENT_OTHER): Payer: Medicare Other | Admitting: Oncology

## 2015-10-31 ENCOUNTER — Ambulatory Visit: Payer: No Typology Code available for payment source

## 2015-10-31 ENCOUNTER — Encounter: Payer: Self-pay | Admitting: Radiation Oncology

## 2015-10-31 ENCOUNTER — Ambulatory Visit
Admission: RE | Admit: 2015-10-31 | Discharge: 2015-10-31 | Disposition: A | Discharge status: Home / Self Care | Payer: No Typology Code available for payment source | Source: Ambulatory Visit | Attending: Radiation Oncology | Admitting: Radiation Oncology

## 2015-10-31 VITALS — BP 103/85 | HR 143 | Temp 98.4°F | Resp 17 | Ht 65.0 in

## 2015-10-31 VITALS — BP 105/78 | HR 128 | Temp 96.8°F | Ht 65.0 in | Wt 146.0 lb

## 2015-10-31 DIAGNOSIS — C50919 Malignant neoplasm of unspecified site of unspecified female breast: Secondary | ICD-10-CM

## 2015-10-31 DIAGNOSIS — C7931 Secondary malignant neoplasm of brain: Secondary | ICD-10-CM

## 2015-10-31 DIAGNOSIS — C50212 Malignant neoplasm of upper-inner quadrant of left female breast: Secondary | ICD-10-CM

## 2015-10-31 DIAGNOSIS — I82409 Acute embolism and thrombosis of unspecified deep veins of unspecified lower extremity: Secondary | ICD-10-CM | POA: Diagnosis present

## 2015-10-31 LAB — CBC WITH DIFFERENTIAL/PLATELET
BASO%: 0.4 % (ref 0.0–2.0)
BASOS ABS: 0.1 10*3/uL (ref 0.0–0.1)
EOS ABS: 0 10*3/uL (ref 0.0–0.5)
EOS%: 0 % (ref 0.0–7.0)
HCT: 30.1 % — ABNORMAL LOW (ref 34.8–46.6)
HGB: 9.6 g/dL — ABNORMAL LOW (ref 11.6–15.9)
LYMPH%: 7.2 % — AB (ref 14.0–49.7)
MCH: 25.3 pg (ref 25.1–34.0)
MCHC: 31.8 g/dL (ref 31.5–36.0)
MCV: 79.5 fL (ref 79.5–101.0)
MONO#: 0.5 10*3/uL (ref 0.1–0.9)
MONO%: 3.4 % (ref 0.0–14.0)
NEUT%: 89 % — AB (ref 38.4–76.8)
NEUTROS ABS: 12.2 10*3/uL — AB (ref 1.5–6.5)
PLATELETS: 352 10*3/uL (ref 145–400)
RBC: 3.79 10*6/uL (ref 3.70–5.45)
RDW: 23.5 % — ABNORMAL HIGH (ref 11.2–14.5)
WBC: 13.7 10*3/uL — AB (ref 3.9–10.3)
lymph#: 1 10*3/uL (ref 0.9–3.3)

## 2015-10-31 LAB — COMPREHENSIVE METABOLIC PANEL (CC13)
ALK PHOS: 280 U/L — AB (ref 40–150)
ALT: 21 U/L (ref 0–55)
ANION GAP: 11 meq/L (ref 3–11)
AST: 111 U/L — ABNORMAL HIGH (ref 5–34)
Albumin: 1.5 g/dL — ABNORMAL LOW (ref 3.5–5.0)
BILIRUBIN TOTAL: 0.85 mg/dL (ref 0.20–1.20)
BUN: 14.2 mg/dL (ref 7.0–26.0)
CO2: 27 meq/L (ref 22–29)
Calcium: 9.1 mg/dL (ref 8.4–10.4)
Chloride: 92 mEq/L — ABNORMAL LOW (ref 98–109)
Creatinine: 0.8 mg/dL (ref 0.6–1.1)
Glucose: 108 mg/dl (ref 70–140)
POTASSIUM: 5 meq/L (ref 3.5–5.1)
Sodium: 130 mEq/L — ABNORMAL LOW (ref 136–145)
TOTAL PROTEIN: 6.8 g/dL (ref 6.4–8.3)

## 2015-10-31 LAB — PROTIME-INR
INR: 3.1 (ref 2.00–3.50)
PROTIME: 37.2 s — AB (ref 10.6–13.4)

## 2015-10-31 MED ORDER — LORAZEPAM 0.5 MG PO TABS: 0.5000 mg | ORAL_TABLET | Freq: Four times a day (QID) | ORAL | Status: DC | PRN

## 2015-10-31 MED ORDER — CARVEDILOL 6.25 MG PO TABS: 6.2500 mg | ORAL_TABLET | Freq: Two times a day (BID) | ORAL | Status: DC

## 2015-10-31 MED ORDER — NYSTATIN 100000 UNIT/ML MT SUSP: 5.0000 mL | Freq: Four times a day (QID) | OROMUCOSAL | Status: DC

## 2015-10-31 MED ORDER — FENTANYL 75 MCG/HR TD PT72: 75.0000 ug | MEDICATED_PATCH | TRANSDERMAL | Status: DC

## 2015-10-31 MED ORDER — ONDANSETRON HCL 8 MG PO TABS: 8.0000 mg | ORAL_TABLET | Freq: Two times a day (BID) | ORAL | Status: DC

## 2015-10-31 MED ORDER — PROCHLORPERAZINE MALEATE 10 MG PO TABS: 10.0000 mg | ORAL_TABLET | Freq: Three times a day (TID) | ORAL | Status: DC | PRN

## 2015-10-31 MED ORDER — CYCLOBENZAPRINE HCL 5 MG PO TABS: ORAL_TABLET | ORAL | Status: DC

## 2015-10-31 MED ORDER — FENTANYL 25 MCG/HR TD PT72: 25.0000 ug | MEDICATED_PATCH | TRANSDERMAL | Status: DC

## 2015-10-31 MED ORDER — OXYCODONE HCL 5 MG PO TABS: ORAL_TABLET | ORAL | Status: DC

## 2015-10-31 MED ORDER — LORATADINE 10 MG PO TABS: 10.0000 mg | ORAL_TABLET | Freq: Every day | ORAL | Status: DC | PRN

## 2015-10-31 MED ORDER — OXYCODONE-ACETAMINOPHEN 5-325 MG PO TABS
2.0000 | ORAL_TABLET | Freq: Once | ORAL | Status: AC
  Administered 2015-10-31: 2 via ORAL

## 2015-10-31 MED ORDER — OXYCODONE-ACETAMINOPHEN 5-325 MG PO TABS
ORAL_TABLET | ORAL | Status: AC
  Filled 2015-10-31: qty 2

## 2015-10-31 MED ORDER — PREDNISONE 10 MG PO TABS: 10.0000 mg | ORAL_TABLET | Freq: Every day | ORAL | Status: DC

## 2015-10-31 NOTE — Progress Notes (Signed)
ID: Erika [name] OB: May 19, 1971  MR#: [id number]  CSN#:[id number]  PCP: Imelda Pillow, NP GYN:   SU: [name] MD OTHER MD: Mardelle Matte MD  CHIEF COMPLAINT:  metastatic breast cancer, congestive heart failure  CURRENT TREATMENT: observation  BREAST CANCER HISTORY: From Dr. [name] original intake note:  "Patient developed a left sided breast mass show up about 6 months prior to diagnosis. 2 months prior this began getting larger rapidly. She was seen in an urgent care where this mass was thought to be an abscess. Apparently there was an attempt to excise or drain this that was not successful and she was referred to Dr. [name] for evaluation. She underwent punch biopsy on 04/23/13 of her skin and the mass. She was then also sent for mm/us. The biopsy shows invasive high grade carcinoma that appears to be breast. It is ER 14%, PR negative, HER-2/neu negative with a Ki-67 that approaches 100%. She has also undergone mm with at least 10 cm upper inner left breast tumor with mild overyling skin thickening. There is also a 3x10 mm cluster of calcs near there also. There is indeterminate 7 mm level 1 left axillary node, she underwent a lymph node biopsy that was positive for disease. No mm evidence of right breast malignancy. An MRI of the breasts was done on 05/22/13 and showed a 14 cm mass in the left breast protruding from the skin and also a 2.5 cm level II axillary node."  Her subsequent history is as detailed below  INTERVAL HISTORY:   Erika [name] returns today for follow-up of her metastatic breast cancer accompanied by her significant other, Jermaine. Since her last visit here she had a chest x-ray to check for free-flowing fluid that could be tapped, but there was no free-flowing fluid. We also discussed chemotherapy and she was scheduled to receive Taxol today if she decided to do that. However at the last visit she was not clear what her wishes would be in regard to  that.  Today she is still "of 2 minds" regarding chemotherapy. Currently she feels "terrible". She does not feel she could tolerate anything that would make her feel worse and I can certainly understand. Part of the problem is that most of her medications including her steroids and all her pain medications have run out. She did not call to tell us about this.  REVIEW OF SYSTEMS:  Latiesha complains of significant pain in her right side, primarily right upper quadrant. She tells me she does not have much appetite but "is eating". She particularly likes cantaloupe's, fruit, and sweeten sour soup. She does not likes sweets. She drinks iced water, but occasionally as soda. Despite being on narcotics chronically she does not have constipation problems. She does not have a wheelchair at home and there are some steps in the house: They are thinking of fixing the downstairs so she can be all on one level. A detailed review of systems today was otherwise stable.  PAST MEDICAL HISTORY: Past Medical History  Diagnosis Date  . Headache(784.0)     Hx: of Migraines  . Anxiety     ANXIOUS ABOUT HAVEING BIG SURGERY - MASTECTOMY  . 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 DVT (deep venous thrombosis) (HCC)     RT ARM-PT ON LOVENOX  . S/P radiation therapy 02-04-14 to 03-18-14  1) Left Chest Wall / 50 Gy in 25 fractions/ 2) Left Supraclavicular fossa/ 46 Gy in 23 fractions / 3) Left Posterior Axillary boost / 8.533 Gy in 23 fractions/ 4) Left Chest Wall Scar boost / 10 Gy in 5 fractions  . CHF (congestive heart failure) ([place])   . Iron deficiency anemia 11/26/13  . Cancer of left breast ([place])     Invasive High Grade Carcinoma; S/P mastectomy, Adriamycin and Cytoxan, radiation  . Metastases to the liver ([place])   . Metastasis to lung [facility])     PAST SURGICAL HISTORY: Past Surgical History  Procedure Laterality Date  . Portacath placement Right 05/22/2013    Procedure:  INSERTION PORT-A-CATH;  Surgeon: [name], MD;  Location: [facility] OR;  Service: General;  Laterality: Right;  . Breast surgery Left 05/21/13; 06/22/13     Needle Core Biopsy  . Mastectomy modified radical Left 11/15/2013    Procedure: LEFT MASTECTOMY MODIFIED RADICAL;  Surgeon: [name], MD;  Location: [facility] ORS;  Service: General;  Laterality: Left;  . Tubal ligation  08/1996    FAMILY HISTORY Family History  Problem Relation Age of Onset  . Hypertension Mother   . Diabetes Father 33  . Stomach cancer Maternal Aunt   . Hypertension Maternal Grandmother   . Hypertension Maternal Grandfather   . Diabetes Paternal Grandmother   . Diabetes Paternal Grandfather   . Diabetes Paternal Uncle   . Diabetes Paternal Uncle   . Diabetes Paternal Uncle   The patient's mother is alive, age 74. The patient's father died from complications of diabetes at the age of 48. The patient has 2 brothers, one sister. There is no history of breast or ovarian cancer in the family to her knowledge .the patient has not undergone genetic testing   GYNECOLOGIC HISTORY:  Menarche age 18, first live birth age 32. The patient is GX P3. The patient has been on goserelin since 06/13/2013, given monthly  SOCIAL HISTORY:  The patient has worked at as a Furniture conservator/restorer and a Retail buyer. She is currently applying for disability. At home she lives with her youngest son, [name], 37; with the patient's brother Ladeja Pelham; and with the patient's fiance [name].   ADVANCED DIRECTIVES: Not in place; at the 09/09/2015 visit the patient was given advanced directives to complete at her discretion; I suggested she bring it next visit so  it can be notarized  HEALTH MAINTENANCE: Social History  Substance Use Topics  . Smoking status: Never Smoker   . Smokeless tobacco: Never Used  . Alcohol Use: No    Allergies  Allergen Reactions  . Doxycycline Nausea And Vomiting    Current Outpatient Prescriptions  Medication  Sig Dispense Refill  . cyclobenzaprine (FLEXERIL) 5 MG tablet TAKE 1 TABLET BY MOUTH THREE TIMES A DAY FOR MUSCLE SPASMS 30 tablet 1  . fentaNYL (DURAGESIC - DOSED MCG/HR) 25 MCG/HR patch Place 1 patch (25 mcg total) onto the skin every 3 (three) days. 10 patch 0  . fentaNYL (DURAGESIC - DOSED MCG/HR) 75 MCG/HR Place 1 patch (75 mcg total) onto the skin every 3 (three) days. 10 patch 0  . gabapentin (NEURONTIN) 300 MG capsule Take 1 capsule (300 mg total) by mouth 3 (three) times daily. 90 capsule 2  . loratadine (CLARITIN) 10 MG tablet Take 10 mg by mouth daily as needed for allergies.    Marland Kitchen LORazepam (ATIVAN) 0.5 MG tablet TAKE 1 TABLET BY MOUTH EVERY 6 HOURS AS NEEDED FOR ANXIETY 30  tablet 0  . nystatin (MYCOSTATIN) 100000 UNIT/ML suspension Take 5 mLs (500,000 Units total) by mouth 4 (four) times daily. Swish in mouth as long as possible, then swallow.  Use for 3 weeks. 473 mL 0  . ondansetron (ZOFRAN) 8 MG tablet Take 1 tablet (8 mg total) by mouth 2 (two) times daily. 20 tablet 1  . oxyCODONE (OXY IR/ROXICODONE) 5 MG immediate release tablet Take 1-2 tablets every 8 hrs as needed for severe or breakthrough pain 180 tablet 0  . potassium chloride SA (K-DUR,KLOR-CON) 20 MEQ tablet Take 1 tablet (20 mEq total) by mouth 4 (four) times daily. 120 tablet 1  . predniSONE (DELTASONE) 10 MG tablet Take 1 tablet (10 mg total) by mouth daily with breakfast. 90 tablet 3  . prochlorperazine (COMPAZINE) 10 MG tablet Take 1 tablet (10 mg total) by mouth every 8 (eight) hours as needed for nausea or vomiting. 30 tablet 0  . warfarin (COUMADIN) 2 MG tablet Take 1 mg by mouth daily.    Marland Kitchen zolpidem (AMBIEN) 5 MG tablet Take 1 tablet (5 mg total) by mouth at bedtime as needed for sleep. 30 tablet 0   No current facility-administered medications for this visit.   Facility-Administered Medications Ordered in Other Visits  Medication Dose Route Frequency Provider Last Rate Last Dose  . Darbepoetin Alfa (ARANESP)  injection 200 mcg  200 mcg Subcutaneous Once [name], NP      . sodium chloride 0.9 % injection 10 mL  10 mL Intravenous PRN [name], MD   10 mL at 06/19/15 1330  . topical emolient (BIAFINE) emulsion   Topical Daily [name], MD        OBJECTIVE: middle-aged African American woman who appears chronically ill, examined in a wheelchair Filed Vitals:   10/31/15 1333  BP: 103/85  Pulse: 143  Temp: 98.4 F (36.9 C)  Resp: 17     There is no weight on file to calculate BMI.      ECOG FS:3 - Symptomatic, >50% confined to bed   Oropharynx shows multiple sores in the soft palate No cervical or supraclavicular adenopathy Lungs no rales or rhonchi Heart rapid but regular rate  Abd distended, firm, tender to palpation in the right upper quadrant MSK left upper extremity lymphedema Neuro: nonfocal, well oriented, appropriate affect Breasts: Deferred   LAB RESULTS:  CMP     Component Value Date/Time   NA 137 10/17/2015 1429   NA 138 09/06/2015 1310   K 2.5* 10/17/2015 1429   K 4.1 09/06/2015 1310   CL 106 09/06/2015 1310   CL 102 06/01/2013 1500   CO2 32* 10/17/2015 1429   CO2 23 09/06/2015 1310   GLUCOSE 123 10/17/2015 1429   GLUCOSE 93 09/06/2015 1310   GLUCOSE 95 06/01/2013 1500   BUN 11.6 10/17/2015 1429   BUN 21* 09/06/2015 1310   CREATININE 0.8 10/17/2015 1429   CREATININE 0.95 09/06/2015 1310   CALCIUM 9.4 10/17/2015 1429   CALCIUM 9.4 09/06/2015 1310   PROT 7.8 10/17/2015 1429   PROT 9.1* 09/06/2015 1310   ALBUMIN 1.8* 10/17/2015 1429   ALBUMIN 2.8* 09/06/2015 1310   AST 68* 10/17/2015 1429   AST 36 09/06/2015 1310   ALT 17 10/17/2015 1429   ALT 11* 09/06/2015 1310   ALKPHOS 199* 10/17/2015 1429   ALKPHOS 136* 09/06/2015 1310   BILITOT 0.46 10/17/2015 1429   BILITOT 0.4 09/06/2015 1310   GFRNONAA >60 09/06/2015 1310   GFRAA >60 09/06/2015  1310    I No results found for: SPEP  Lab Results  Component Value Date   WBC 13.7*  10/31/2015   NEUTROABS 12.2* 10/31/2015   HGB 9.6* 10/31/2015   HCT 30.1* 10/31/2015   MCV 79.5 10/31/2015   PLT 352 10/31/2015      Chemistry      Component Value Date/Time   NA 137 10/17/2015 1429   NA 138 09/06/2015 1310   K 2.5* 10/17/2015 1429   K 4.1 09/06/2015 1310   CL 106 09/06/2015 1310   CL 102 06/01/2013 1500   CO2 32* 10/17/2015 1429   CO2 23 09/06/2015 1310   BUN 11.6 10/17/2015 1429   BUN 21* 09/06/2015 1310   CREATININE 0.8 10/17/2015 1429   CREATININE 0.95 09/06/2015 1310      Component Value Date/Time   CALCIUM 9.4 10/17/2015 1429   CALCIUM 9.4 09/06/2015 1310   ALKPHOS 199* 10/17/2015 1429   ALKPHOS 136* 09/06/2015 1310   AST 68* 10/17/2015 1429   AST 36 09/06/2015 1310   ALT 17 10/17/2015 1429   ALT 11* 09/06/2015 1310   BILITOT 0.46 10/17/2015 1429   BILITOT 0.4 09/06/2015 1310       Lab Results  Component Value Date   LABCA2 35 05/18/2013    No components found for: LABCA125   Recent Labs Lab 10/31/15 1318  INR 3.10    Urinalysis    Component Value Date/Time   COLORURINE YELLOW 09/06/2015 1415    STUDIES: Dg Chest 2 View  10/21/2015  CLINICAL DATA:  Sob, weakness, hx breast cancer, possibly for a thoracentesis if free fluid is present EXAM: CHEST  2 VIEW COMPARISON:  08/17/2015 FINDINGS: Cardiac contours are obscured by significant right-sided pleural effusion. Right-sided PowerPort tip overlies the level of the superior vena cava, unchanged. Right lung opacity is significantly. Small amount of aerated lung persists in the apex. Nodular appearance of pleura at the lung apex, consistent with metastatic disease. Numerous metastases again identified throughout the left lung. Previous left mastectomy and axillary node dissection. Note is made of mottled appearance of right lateral ribs suggestive of metastatic disease. IMPRESSION: 1. Significant increase in right-sided opacity. Considerations include increased pleural effusion or  progression of metastatic disease. 2. Further evaluation is CT of the chest is recommended if thoracentesis is being considered. 3. Nodular appearance of the pleura consistent with metastatic disease. Electronically Signed   By: Nolon Nations M.D.   On: 10/21/2015 17:06   [place] Chest  10/23/2015  CLINICAL DATA:  Abnormal chest x-ray.  Possible pleural fluid. EXAM: CHEST ULTRASOUND COMPARISON:  Two-view chest x-ray 10/21/2015 FINDINGS: No significant pleural fluid is present. IMPRESSION: No significant fluid. Electronically Signed   By: San Morelle M.D.   On: 10/23/2015 13:23    ASSESSMENT: 44 y.o.  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  (0) genetic testing of the ATM, BARD1, BRCA1, BRCA2, BRIP1, CDH1, CHEK2, EPCAM, FANCC, MLH1, MSH2, MSH6, NBN, PALB2, PMS2, PTEN, RAD51C, RAD51D, STK11, TP53, and XRCC2 genes showed no deleterious mutations  (1) status post left breast biopsy 04/23/2013 for a clinical T4 N1, stage IIIB invasive ductal carcinoma, grade 3,  estrogen receptor 14% positive with moderate staining intensity, progesterone receptor negative,  with an MIB-1 of 100% and HER-2/neu  nonamplified  (2) biopsy of a left axillary lymph node [phone] was positive  (3) started monthly goserelin July of 02/01/2013  (4)  received  neoadjuvant chemotherapy with doxorubicin and cyclophosphamide in dose  dense fashion x4, followed by carboplatin and paclitaxel x1, discontinued due to neuropathy, followed by carboplatin and gemcitabine, both given day 1 of each 14 day cycle x6, completed 10/12/2013 .   (5) right upper extremity DVT documented 06/29/2013,  treated with Lovenox from July  through December 2014    (6) status post left modified radical mastectomy 11/15/2013 for a residual pT3 pN0 invasive ductal carcinoma, grade 3, with repeat prognostic panel triple negative. All 12 axillary lymph nodes were clear. Margins were negative   (5) completed  adjuvant radiation therapy 03/18/2014,  with capecitabine added 2-3 weeks into treatment.  METASTATIC DISEASE  (6) CT scans of the chest, abdomen, and pelvis 06/26/2014 showed a large right pleural effusion  with pleural studding, 2 left upper lobe nodules, and two  liver lesions. Cytology from right thoracentesis 07/03/2014 showed malignant cells consistent with the patient's known primary  (7) started eribulin 07/09/2014, given days 1 and 8 of each 21 day cycle; restagind studies after cycle 3 showed evidence of response. Discontinued after cycle 5 (last dose 10/08/14) because of worsening peripheral neuropathy--also with progression.   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 Antithrombin level was normal.  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 She is hurting right now. We are giving her to Percocet but I am also refilling all her medications that have run out including the prednisone that she ran out of a few days ago.  We discussed chemotherapy again she says she is "of 2 minds" about it. She on the one hand would like to receive it on the other hand she is worried. I told her that I am very concerned that she receives chemotherapy it may do her in or significantly worsen her already very limited quality of life. I suggested she let me place a hospice referral, which would allow her to get a wheelchair at home, may be a hospital bed so she can live in a single level and not have to be going up and down steps, and who would also make sure that she never runs out of medicines as digested, especially if that happens on a weekend.   She reluctantly agreed to a hospice referral to be placed Monday. I am also making her a return appointment with me and I told her that if she wishes to decide for chemotherapy she can have a 20 returns to see me 2 weeks from now.  Meanwhile we are continuing the Coumadin at 2.5 mg every other day,  and I added a low-dose carvedilol to see if we can bring her heart rate down a little. I encouraged her to continue to eat and drink as much as she can. She is doing well as far as bowel movements is concerned.  After very has never completed her advanced directives. My own feeling is that resuscitation in case of a terminal event would be futile however we do not have a formal DO NOT RESUSCITATE at this point    [name], MD 10/31/2015 1:41 PM    Medical Oncology and Hematology [facility] [place], [place] [address] Tel. [phone]    Fax. [phone]

## 2015-10-31 NOTE — Progress Notes (Signed)
Radiation Oncology         [phone] ________________________________  Name: Erika [name] MRN: [id number]  Date: 10/31/2015  DOB: Apr 03, 1971  Follow-Up Visit Note  Outpatient  CC: Imelda Pillow, NP  Magrinat, Virgie Dad, MD  Diagnosis and Prior Radiotherapy:    ICD-9-CM ICD-10-CM   1. Brain metastases (HCC) 198.3 C79.31 dexamethasone (DECADRON) 0.5 MG tablet     furosemide (LASIX) 20 MG tablet     nystatin (MYCOSTATIN) 100000 UNIT/ML suspension    Brain Metastases, breast primary  Indication for treatment:  palliative       Radiation treatment dates:   09-22-15  Site/dose/Beams/energy:  6FFF photon beams and SRS technique were used:  Left Parietal 47mm + punctate lesions target was treated using 5 Dynamic Conformal Arcs to a prescription dose of 20 Gy.     Right Occipital 46mm target was treated using 4 Dynamic Conformal Arcs to a prescription dose of 20 Gy.    Left Parietal 41mm target was treated using 3 Circular Arcs to a prescription dose of 20 Gy.  ExacTrac Snap verification was performed for each couch angle.  Narrative:  The patient returns today for routine follow-up. The pt was seen earlier by med/onc. She had run out of pain medication and was feeling poorly when she saw Dr. Jana Hakim. He ordered prescriptions for prednisone and pain medications as well as lorazepam and anti-emetics. She denies taking dexamethasone. PET scan 1 month ago showed progression of widespread metastatic disease in the viscera and bones. Dr. Jana Hakim discussed chemotherapy vs. Hospice as options. The plan is for a hospice referral at this point in time. She denies seizures and denies new weakness or numbness in her arms or legs. She denies a sore mouth or throat.  ALLERGIES:  is allergic to doxycycline.  Meds: Current Outpatient Prescriptions  Medication Sig Dispense Refill  . carvedilol (COREG) 6.25 MG tablet Take 1 tablet (6.25 mg total) by mouth 2 (two) times daily with a meal. 60  tablet 3  . cyclobenzaprine (FLEXERIL) 5 MG tablet TAKE 1 TABLET BY MOUTH THREE TIMES A DAY FOR MUSCLE SPASMS 30 tablet 1  . gabapentin (NEURONTIN) 300 MG capsule Take 1 capsule (300 mg total) by mouth 3 (three) times daily. 90 capsule 2  . loratadine (CLARITIN) 10 MG tablet Take 1 tablet (10 mg total) by mouth daily as needed for allergies. 30 tablet 4  . LORazepam (ATIVAN) 0.5 MG tablet Take 1 tablet (0.5 mg total) by mouth every 6 (six) hours as needed. for anxiety 30 tablet 0  . nystatin (MYCOSTATIN) 100000 UNIT/ML suspension Take 5 mLs (500,000 Units total) by mouth 4 (four) times daily. Swish in mouth as long as possible, then swallow.  Use for 3 weeks. 473 mL 0  . ondansetron (ZOFRAN) 8 MG tablet Take 1 tablet (8 mg total) by mouth 2 (two) times daily. 20 tablet 1  . potassium chloride SA (K-DUR,KLOR-CON) 20 MEQ tablet Take 1 tablet (20 mEq total) by mouth 4 (four) times daily. 120 tablet 1  . predniSONE (DELTASONE) 10 MG tablet Take 1 tablet (10 mg total) by mouth daily with breakfast. 90 tablet 3  . prochlorperazine (COMPAZINE) 10 MG tablet Take 1 tablet (10 mg total) by mouth every 8 (eight) hours as needed for nausea or vomiting. 30 tablet 0  . warfarin (COUMADIN) 2 MG tablet Take 1 mg by mouth daily.    Marland Kitchen zolpidem (AMBIEN) 5 MG tablet Take 1 tablet (5 mg total) by mouth at bedtime  as needed for sleep. 30 tablet 0  . dexamethasone (DECADRON) 0.5 MG tablet Take 0.5 mg by mouth.    . fentaNYL (DURAGESIC - DOSED MCG/HR) 25 MCG/HR patch Place 1 patch (25 mcg total) onto the skin every 3 (three) days. (Patient not taking: Reported on 10/31/2015) 10 patch 0  . fentaNYL (DURAGESIC - DOSED MCG/HR) 75 MCG/HR Place 1 patch (75 mcg total) onto the skin every 3 (three) days. (Patient not taking: Reported on 10/31/2015) 10 patch 0  . furosemide (LASIX) 20 MG tablet   6  . oxyCODONE (OXY IR/ROXICODONE) 5 MG immediate release tablet Take 1-2 tablets every 8 hrs as needed for severe or breakthrough pain  (Patient not taking: Reported on 10/31/2015) 180 tablet 0   No current facility-administered medications for this encounter.   Facility-Administered Medications Ordered in Other Encounters  Medication Dose Route Frequency Provider Last Rate Last Dose  . Darbepoetin Alfa (ARANESP) injection 200 mcg  200 mcg Subcutaneous Once [name], NP      . sodium chloride 0.9 % injection 10 mL  10 mL Intravenous PRN [name], MD   10 mL at 06/19/15 1330  . topical emolient (BIAFINE) emulsion   Topical Daily [name], MD        Physical Findings: The patient is in no acute distress. Patient is ill appearing.  height is 5\' 5"  (1.651 m) and weight is 146 lb (66.225 kg). Her oral temperature is 96.8 F (36 C). Her blood pressure is 105/78 and her pulse is 128. Her oxygen saturation is 99%.  The patient presents to the clinic in a wheelchair. Widespread oral and oropharyngeal thrush noted. No palpable cervical or supraclavicular adenopathy. Rapidly alternating movements intact. Finger to nose testing is intact. 4+/5 strength symmetric in her upper and lower extremities.  Lab Findings: Lab Results  Component Value Date   WBC 13.7* 10/31/2015   HGB 9.6* 10/31/2015   HCT 30.1* 10/31/2015   MCV 79.5 10/31/2015   PLT 352 10/31/2015    Radiographic Findings: Dg Chest 2 View  10/21/2015  CLINICAL DATA:  Sob, weakness, hx breast cancer, possibly for a thoracentesis if free fluid is present EXAM: CHEST  2 VIEW COMPARISON:  08/17/2015 FINDINGS: Cardiac contours are obscured by significant right-sided pleural effusion. Right-sided PowerPort tip overlies the level of the superior vena cava, unchanged. Right lung opacity is significantly. Small amount of aerated lung persists in the apex. Nodular appearance of pleura at the lung apex, consistent with metastatic disease. Numerous metastases again identified throughout the left lung. Previous left mastectomy and axillary node dissection. Note is  made of mottled appearance of right lateral ribs suggestive of metastatic disease. IMPRESSION: 1. Significant increase in right-sided opacity. Considerations include increased pleural effusion or progression of metastatic disease. 2. Further evaluation is CT of the chest is recommended if thoracentesis is being considered. 3. Nodular appearance of the pleura consistent with metastatic disease. Electronically Signed   By: Nolon Nations M.D.   On: 10/21/2015 17:06   [place] Chest  10/23/2015  CLINICAL DATA:  Abnormal chest x-ray.  Possible pleural fluid. EXAM: CHEST ULTRASOUND COMPARISON:  Two-view chest x-ray 10/21/2015 FINDINGS: No significant pleural fluid is present. IMPRESSION: No significant fluid. Electronically Signed   By: San Morelle M.D.   On: 10/23/2015 13:23   Impression/Plan: Progressive systemic disease. No subacute side effects from radiotherapy.   I will refill the patient's Nystatin for her thrush. I explained to the patient that she should swish and  swallow the Nystatin as long as possible and swallow it. She was not doing that before.  She is on Warfarin, and fluconazole would be difficult to take safely (requiring frequent INRs).   She will enroll on hospice soon.  I will see her back PRN.  _____________________________________   [name], MD  This document serves as a record of services personally performed by [name], MD. It was created on her behalf by Darcus Austin, a trained medical scribe. The creation of this record is based on the scribe's personal observations and the provider's statements to them. This document has been checked and approved by the attending provider.

## 2015-10-31 NOTE — Progress Notes (Signed)
Erika [name] here for FU reports pain at 3 out of 10 generalized. Has a scrip for Oxycodone and Fentanyl patch  To get filled today.  Denies having headaches, reports having nausea and dizziness.   Not taking Decadron wanders if she should be taking this medication.  Taking Prednisone.  Has white spots in the back of her throat.  In a wheelchair today not walking at home.  BP 105/78 mmHg  Pulse 128  Temp(Src) 96.8 F (36 C) (Oral)  Ht 5\' 5"  (1.651 m)  Wt 146 lb (66.225 kg)  BMI 24.30 kg/m2  SpO2 99%  LMP 05/30/2013   Wt Readings from Last 3 Encounters:  10/31/15 146 lb (66.225 kg)  10/17/15 146 lb 12.8 oz (66.588 kg)  10/06/15 152 lb (68.947 kg)

## 2015-11-03 ENCOUNTER — Ambulatory Visit (HOSPITAL_COMMUNITY): Payer: No Typology Code available for payment source

## 2015-11-03 ENCOUNTER — Telehealth: Payer: Self-pay | Admitting: Oncology

## 2015-11-03 NOTE — Telephone Encounter (Signed)
lvm for pt regarding to added DEC 2 appt.Marland KitchenMarland Kitchen

## 2015-11-04 ENCOUNTER — Encounter: Payer: No Typology Code available for payment source | Admitting: Genetic Counselor

## 2015-11-04 ENCOUNTER — Other Ambulatory Visit: Payer: No Typology Code available for payment source

## 2015-11-05 ENCOUNTER — Other Ambulatory Visit: Payer: Self-pay | Admitting: Oncology

## 2015-11-05 ENCOUNTER — Other Ambulatory Visit: Payer: Self-pay | Admitting: *Deleted

## 2015-11-07 ENCOUNTER — Other Ambulatory Visit: Payer: No Typology Code available for payment source

## 2015-11-07 ENCOUNTER — Telehealth: Payer: Self-pay | Admitting: *Deleted

## 2015-11-07 ENCOUNTER — Ambulatory Visit: Payer: No Typology Code available for payment source

## 2015-11-07 NOTE — Telephone Encounter (Signed)
This RN spoke with pt's daughter Erika [name] per her call regarding appointment today.  This RN informed Erika [name] appointment should have been cancelled per discussion at last visit. Per visit - hospice is to contact them to discuss services.  Erika [name] states " we don't want hospice - I have changed my work hours now so someone can be with her and take care of her at all times - so we don't need them "  This RN informed Erika [name] - above would not be affected by hospice services but they are a resource that can help [place] and family.  Erika [name] stated " I would like to see you guys give my mom something else that would work "  This RN validated Erika [name]'s concerns and verbalizations. Informed her that presently chemo would likely hurt her mother more then help her. Goal presently to to help Erika [name] stay strong by support - including home medications, and possible transfusion if necessary.  Erika [name] stated " then we are on the same page about taking care of my mom"  Erika [name] has not been able to attend MD appointments due to her work schedule- per this conversation she is now available to come at her mother's appointment to hear and discuss concerns more directly with MD.  Presently primary concern by Erika [name] is increasing swelling in pt's left arm which is now occuring in the right arm.  Per Erika [name] " I want you guys to do something to pull the fluid off ".  This RN informed Erika [name] above may be related to multiple things and swelling may not be able to be lessened.  Erika [name] is currently taking a 20mg  lasix daily " but not urinating a lot"  This note will be reviewed with NP for any further recommendations.

## 2015-11-10 ENCOUNTER — Encounter (HOSPITAL_COMMUNITY): Payer: Self-pay | Admitting: Emergency Medicine

## 2015-11-10 ENCOUNTER — Inpatient Hospital Stay (HOSPITAL_COMMUNITY)
Admission: EM | Admit: 2015-11-10 | Discharge: 2015-12-14 | DRG: 054 | Disposition: E | Payer: Medicare Other | Attending: Internal Medicine | Admitting: Internal Medicine

## 2015-11-10 ENCOUNTER — Emergency Department (HOSPITAL_COMMUNITY): Payer: Medicare Other

## 2015-11-10 DIAGNOSIS — Z8249 Family history of ischemic heart disease and other diseases of the circulatory system: Secondary | ICD-10-CM | POA: Diagnosis not present

## 2015-11-10 DIAGNOSIS — A419 Sepsis, unspecified organism: Secondary | ICD-10-CM | POA: Diagnosis present

## 2015-11-10 DIAGNOSIS — C50912 Malignant neoplasm of unspecified site of left female breast: Secondary | ICD-10-CM | POA: Diagnosis present

## 2015-11-10 DIAGNOSIS — Z7952 Long term (current) use of systemic steroids: Secondary | ICD-10-CM

## 2015-11-10 DIAGNOSIS — I420 Dilated cardiomyopathy: Secondary | ICD-10-CM | POA: Diagnosis present

## 2015-11-10 DIAGNOSIS — C7931 Secondary malignant neoplasm of brain: Secondary | ICD-10-CM | POA: Diagnosis present

## 2015-11-10 DIAGNOSIS — D638 Anemia in other chronic diseases classified elsewhere: Secondary | ICD-10-CM | POA: Diagnosis present

## 2015-11-10 DIAGNOSIS — F419 Anxiety disorder, unspecified: Secondary | ICD-10-CM | POA: Diagnosis present

## 2015-11-10 DIAGNOSIS — G620 Drug-induced polyneuropathy: Secondary | ICD-10-CM | POA: Diagnosis present

## 2015-11-10 DIAGNOSIS — C7951 Secondary malignant neoplasm of bone: Secondary | ICD-10-CM | POA: Diagnosis present

## 2015-11-10 DIAGNOSIS — E860 Dehydration: Secondary | ICD-10-CM | POA: Diagnosis present

## 2015-11-10 DIAGNOSIS — Z800 Family history of malignant neoplasm of digestive organs: Secondary | ICD-10-CM

## 2015-11-10 DIAGNOSIS — D72829 Elevated white blood cell count, unspecified: Secondary | ICD-10-CM | POA: Diagnosis present

## 2015-11-10 DIAGNOSIS — I5022 Chronic systolic (congestive) heart failure: Secondary | ICD-10-CM | POA: Diagnosis present

## 2015-11-10 DIAGNOSIS — R945 Abnormal results of liver function studies: Secondary | ICD-10-CM

## 2015-11-10 DIAGNOSIS — G893 Neoplasm related pain (acute) (chronic): Secondary | ICD-10-CM

## 2015-11-10 DIAGNOSIS — Z79891 Long term (current) use of opiate analgesic: Secondary | ICD-10-CM

## 2015-11-10 DIAGNOSIS — C7801 Secondary malignant neoplasm of right lung: Secondary | ICD-10-CM | POA: Diagnosis present

## 2015-11-10 DIAGNOSIS — N179 Acute kidney failure, unspecified: Secondary | ICD-10-CM | POA: Diagnosis not present

## 2015-11-10 DIAGNOSIS — R791 Abnormal coagulation profile: Secondary | ICD-10-CM

## 2015-11-10 DIAGNOSIS — R400 Somnolence: Secondary | ICD-10-CM

## 2015-11-10 DIAGNOSIS — E871 Hypo-osmolality and hyponatremia: Secondary | ICD-10-CM

## 2015-11-10 DIAGNOSIS — C50919 Malignant neoplasm of unspecified site of unspecified female breast: Secondary | ICD-10-CM | POA: Diagnosis not present

## 2015-11-10 DIAGNOSIS — Z7901 Long term (current) use of anticoagulants: Secondary | ICD-10-CM

## 2015-11-10 DIAGNOSIS — Z86718 Personal history of other venous thrombosis and embolism: Secondary | ICD-10-CM | POA: Diagnosis not present

## 2015-11-10 DIAGNOSIS — E861 Hypovolemia: Secondary | ICD-10-CM | POA: Diagnosis present

## 2015-11-10 DIAGNOSIS — T451X5A Adverse effect of antineoplastic and immunosuppressive drugs, initial encounter: Secondary | ICD-10-CM | POA: Diagnosis present

## 2015-11-10 DIAGNOSIS — R627 Adult failure to thrive: Secondary | ICD-10-CM | POA: Diagnosis present

## 2015-11-10 DIAGNOSIS — C7989 Secondary malignant neoplasm of other specified sites: Secondary | ICD-10-CM | POA: Diagnosis present

## 2015-11-10 DIAGNOSIS — H0520 Unspecified exophthalmos: Secondary | ICD-10-CM | POA: Diagnosis present

## 2015-11-10 DIAGNOSIS — C787 Secondary malignant neoplasm of liver and intrahepatic bile duct: Secondary | ICD-10-CM | POA: Diagnosis present

## 2015-11-10 DIAGNOSIS — E872 Acidosis: Secondary | ICD-10-CM | POA: Diagnosis present

## 2015-11-10 DIAGNOSIS — Z79899 Other long term (current) drug therapy: Secondary | ICD-10-CM | POA: Diagnosis not present

## 2015-11-10 DIAGNOSIS — R0603 Acute respiratory distress: Secondary | ICD-10-CM | POA: Insufficient documentation

## 2015-11-10 DIAGNOSIS — Z833 Family history of diabetes mellitus: Secondary | ICD-10-CM

## 2015-11-10 DIAGNOSIS — D509 Iron deficiency anemia, unspecified: Secondary | ICD-10-CM | POA: Diagnosis present

## 2015-11-10 DIAGNOSIS — Z9012 Acquired absence of left breast and nipple: Secondary | ICD-10-CM | POA: Diagnosis not present

## 2015-11-10 DIAGNOSIS — Z515 Encounter for palliative care: Secondary | ICD-10-CM | POA: Diagnosis not present

## 2015-11-10 DIAGNOSIS — R7989 Other specified abnormal findings of blood chemistry: Secondary | ICD-10-CM

## 2015-11-10 DIAGNOSIS — E2740 Unspecified adrenocortical insufficiency: Secondary | ICD-10-CM | POA: Diagnosis present

## 2015-11-10 DIAGNOSIS — C782 Secondary malignant neoplasm of pleura: Secondary | ICD-10-CM | POA: Diagnosis present

## 2015-11-10 DIAGNOSIS — Z881 Allergy status to other antibiotic agents status: Secondary | ICD-10-CM | POA: Diagnosis not present

## 2015-11-10 DIAGNOSIS — I959 Hypotension, unspecified: Secondary | ICD-10-CM | POA: Diagnosis present

## 2015-11-10 DIAGNOSIS — Z6827 Body mass index (BMI) 27.0-27.9, adult: Secondary | ICD-10-CM

## 2015-11-10 DIAGNOSIS — G9340 Encephalopathy, unspecified: Secondary | ICD-10-CM

## 2015-11-10 DIAGNOSIS — D649 Anemia, unspecified: Secondary | ICD-10-CM | POA: Diagnosis present

## 2015-11-10 DIAGNOSIS — C7802 Secondary malignant neoplasm of left lung: Secondary | ICD-10-CM | POA: Diagnosis present

## 2015-11-10 DIAGNOSIS — G936 Cerebral edema: Secondary | ICD-10-CM | POA: Diagnosis present

## 2015-11-10 DIAGNOSIS — R6521 Severe sepsis with septic shock: Secondary | ICD-10-CM | POA: Diagnosis present

## 2015-11-10 DIAGNOSIS — R4182 Altered mental status, unspecified: Secondary | ICD-10-CM | POA: Diagnosis present

## 2015-11-10 DIAGNOSIS — Z923 Personal history of irradiation: Secondary | ICD-10-CM

## 2015-11-10 DIAGNOSIS — Z66 Do not resuscitate: Secondary | ICD-10-CM | POA: Diagnosis present

## 2015-11-10 DIAGNOSIS — E46 Unspecified protein-calorie malnutrition: Secondary | ICD-10-CM | POA: Diagnosis present

## 2015-11-10 DIAGNOSIS — I9589 Other hypotension: Secondary | ICD-10-CM

## 2015-11-10 DIAGNOSIS — J910 Malignant pleural effusion: Secondary | ICD-10-CM | POA: Insufficient documentation

## 2015-11-10 LAB — COMPREHENSIVE METABOLIC PANEL
ALBUMIN: 1.5 g/dL — AB (ref 3.5–5.0)
ALT: 33 U/L (ref 14–54)
AST: 123 U/L — AB (ref 15–41)
Alkaline Phosphatase: 239 U/L — ABNORMAL HIGH (ref 38–126)
Anion gap: 10 (ref 5–15)
BILIRUBIN TOTAL: 1.3 mg/dL — AB (ref 0.3–1.2)
BUN: 34 mg/dL — AB (ref 6–20)
CHLORIDE: 100 mmol/L — AB (ref 101–111)
CO2: 21 mmol/L — ABNORMAL LOW (ref 22–32)
CREATININE: 1.52 mg/dL — AB (ref 0.44–1.00)
Calcium: 7.9 mg/dL — ABNORMAL LOW (ref 8.9–10.3)
GFR calc Af Amer: 47 mL/min — ABNORMAL LOW (ref >60)
GFR, EST NON AFRICAN AMERICAN: 40 mL/min — AB (ref >60)
GLUCOSE: 112 mg/dL — AB (ref 65–99)
POTASSIUM: 4.8 mmol/L (ref 3.5–5.1)
Sodium: 131 mmol/L — ABNORMAL LOW (ref 135–145)
Total Protein: 5.7 g/dL — ABNORMAL LOW (ref 6.5–8.1)

## 2015-11-10 LAB — AMMONIA: AMMONIA: 27 umol/L (ref 9–35)

## 2015-11-10 LAB — URINE MICROSCOPIC-ADD ON: RBC / HPF: NONE SEEN RBC/hpf (ref 0–5)

## 2015-11-10 LAB — URINALYSIS, ROUTINE W REFLEX MICROSCOPIC
GLUCOSE, UA: NEGATIVE mg/dL
HGB URINE DIPSTICK: NEGATIVE
Ketones, ur: NEGATIVE mg/dL
Nitrite: NEGATIVE
PH: 5 (ref 5.0–8.0)
Protein, ur: 30 mg/dL — AB
SPECIFIC GRAVITY, URINE: 1.022 (ref 1.005–1.030)

## 2015-11-10 LAB — DIFFERENTIAL
BASOS ABS: 0 10*3/uL (ref 0.0–0.1)
BASOS PCT: 0 %
EOS PCT: 0 %
Eosinophils Absolute: 0 10*3/uL (ref 0.0–0.7)
LYMPHS ABS: 1.3 10*3/uL (ref 0.7–4.0)
Lymphocytes Relative: 9 %
MONOS PCT: 6 %
Monocytes Absolute: 0.8 10*3/uL (ref 0.1–1.0)
NEUTROS ABS: 12 10*3/uL — AB (ref 1.7–7.7)
Neutrophils Relative %: 85 %

## 2015-11-10 LAB — PROTIME-INR
INR: 3.21 — ABNORMAL HIGH (ref 0.00–1.49)
PROTHROMBIN TIME: 32.2 s — AB (ref 11.6–15.2)

## 2015-11-10 LAB — CBC
HEMATOCRIT: 25.8 % — AB (ref 36.0–46.0)
Hemoglobin: 7.8 g/dL — ABNORMAL LOW (ref 12.0–15.0)
MCH: 25.6 pg — ABNORMAL LOW (ref 26.0–34.0)
MCHC: 30.2 g/dL (ref 30.0–36.0)
MCV: 84.6 fL (ref 78.0–100.0)
PLATELETS: 225 10*3/uL (ref 150–400)
RBC: 3.05 MIL/uL — ABNORMAL LOW (ref 3.87–5.11)
RDW: 22.9 % — AB (ref 11.5–15.5)
WBC: 14.1 10*3/uL — AB (ref 4.0–10.5)

## 2015-11-10 LAB — RAPID URINE DRUG SCREEN, HOSP PERFORMED
AMPHETAMINES: NOT DETECTED
BARBITURATES: NOT DETECTED
BENZODIAZEPINES: POSITIVE — AB
Cocaine: NOT DETECTED
Opiates: POSITIVE — AB
TETRAHYDROCANNABINOL: NOT DETECTED

## 2015-11-10 LAB — I-STAT CG4 LACTIC ACID, ED
LACTIC ACID, VENOUS: 2.98 mmol/L — AB (ref 0.5–2.0)
Lactic Acid, Venous: 3.11 mmol/L (ref 0.5–2.0)

## 2015-11-10 LAB — MRSA PCR SCREENING: MRSA by PCR: POSITIVE — AB

## 2015-11-10 LAB — CBG MONITORING, ED: Glucose-Capillary: 105 mg/dL — ABNORMAL HIGH (ref 65–99)

## 2015-11-10 MED ORDER — VANCOMYCIN HCL IN DEXTROSE 750-5 MG/150ML-% IV SOLN
750.0000 mg | Freq: Two times a day (BID) | INTRAVENOUS | Status: DC
  Administered 2015-11-11 – 2015-11-12 (×3): 750 mg via INTRAVENOUS
  Filled 2015-11-10 (×4): qty 150

## 2015-11-10 MED ORDER — VANCOMYCIN HCL IN DEXTROSE 1-5 GM/200ML-% IV SOLN
1000.0000 mg | INTRAVENOUS | Status: AC
  Administered 2015-11-10: 1000 mg via INTRAVENOUS
  Filled 2015-11-10: qty 200

## 2015-11-10 MED ORDER — LORAZEPAM 0.5 MG PO TABS
0.5000 mg | ORAL_TABLET | Freq: Four times a day (QID) | ORAL | Status: DC | PRN
  Administered 2015-11-11: 0.5 mg via ORAL
  Filled 2015-11-10: qty 1

## 2015-11-10 MED ORDER — MORPHINE SULFATE (PF) 2 MG/ML IV SOLN
1.0000 mg | INTRAVENOUS | Status: DC | PRN
  Administered 2015-11-10 – 2015-11-11 (×7): 1 mg via INTRAVENOUS
  Filled 2015-11-10 (×7): qty 1

## 2015-11-10 MED ORDER — DEXAMETHASONE SODIUM PHOSPHATE 4 MG/ML IJ SOLN
4.0000 mg | Freq: Four times a day (QID) | INTRAMUSCULAR | Status: DC
  Administered 2015-11-10 – 2015-11-15 (×20): 4 mg via INTRAVENOUS
  Filled 2015-11-10 (×20): qty 1

## 2015-11-10 MED ORDER — PIPERACILLIN-TAZOBACTAM 3.375 G IVPB
3.3750 g | INTRAVENOUS | Status: AC
  Administered 2015-11-10: 3.375 g via INTRAVENOUS
  Filled 2015-11-10: qty 50

## 2015-11-10 MED ORDER — DOPAMINE-DEXTROSE 3.2-5 MG/ML-% IV SOLN
0.0000 ug/kg/min | INTRAVENOUS | Status: DC
  Administered 2015-11-10: 10 ug/kg/min via INTRAVENOUS
  Filled 2015-11-10: qty 250

## 2015-11-10 MED ORDER — SODIUM CHLORIDE 0.9 % IJ SOLN
3.0000 mL | Freq: Two times a day (BID) | INTRAMUSCULAR | Status: DC
  Administered 2015-11-10 – 2015-11-13 (×2): 3 mL via INTRAVENOUS

## 2015-11-10 MED ORDER — MUPIROCIN 2 % EX OINT
1.0000 "application " | TOPICAL_OINTMENT | Freq: Two times a day (BID) | CUTANEOUS | Status: AC
  Administered 2015-11-10 – 2015-11-14 (×8): 1 via NASAL
  Filled 2015-11-10 (×2): qty 22

## 2015-11-10 MED ORDER — DOPAMINE-DEXTROSE 3.2-5 MG/ML-% IV SOLN
0.0000 ug/kg/min | INTRAVENOUS | Status: DC
  Administered 2015-11-10: 5 ug/kg/min via INTRAVENOUS
  Filled 2015-11-10: qty 250

## 2015-11-10 MED ORDER — SODIUM CHLORIDE 0.9 % IV BOLUS (SEPSIS)
750.0000 mL | Freq: Once | INTRAVENOUS | Status: AC
  Administered 2015-11-10: 750 mL via INTRAVENOUS

## 2015-11-10 MED ORDER — SODIUM CHLORIDE 0.9 % IV BOLUS (SEPSIS)
1000.0000 mL | Freq: Once | INTRAVENOUS | Status: AC
Start: 2015-11-10 — End: 2015-11-10
  Administered 2015-11-10: 1000 mL via INTRAVENOUS

## 2015-11-10 MED ORDER — SODIUM CHLORIDE 0.9 % IV SOLN
1000.0000 mL | INTRAVENOUS | Status: DC
  Administered 2015-11-10 – 2015-11-14 (×6): 1000 mL via INTRAVENOUS

## 2015-11-10 MED ORDER — ONDANSETRON HCL 4 MG PO TABS: 4.0000 mg | ORAL_TABLET | Freq: Four times a day (QID) | ORAL | Status: DC | PRN

## 2015-11-10 MED ORDER — CHLORHEXIDINE GLUCONATE CLOTH 2 % EX PADS
6.0000 | MEDICATED_PAD | Freq: Every day | CUTANEOUS | Status: DC
  Administered 2015-11-10: 6 via TOPICAL

## 2015-11-10 MED ORDER — DEXAMETHASONE SODIUM PHOSPHATE 10 MG/ML IJ SOLN
12.0000 mg | Freq: Once | INTRAMUSCULAR | Status: AC
  Administered 2015-11-10: 12 mg via INTRAVENOUS
  Filled 2015-11-10: qty 2

## 2015-11-10 MED ORDER — SODIUM CHLORIDE 0.9 % IV SOLN
1000.0000 mL | Freq: Once | INTRAVENOUS | Status: AC
  Administered 2015-11-10: 1000 mL via INTRAVENOUS

## 2015-11-10 MED ORDER — ONDANSETRON HCL 4 MG/2ML IJ SOLN
4.0000 mg | Freq: Four times a day (QID) | INTRAMUSCULAR | Status: DC | PRN
  Administered 2015-11-13: 4 mg via INTRAVENOUS
  Filled 2015-11-10: qty 2

## 2015-11-10 MED ORDER — FENTANYL 25 MCG/HR TD PT72
25.0000 ug | MEDICATED_PATCH | TRANSDERMAL | Status: DC
  Administered 2015-11-10: 25 ug via TRANSDERMAL
  Filled 2015-11-10: qty 1

## 2015-11-10 MED ORDER — PIPERACILLIN-TAZOBACTAM 3.375 G IVPB
3.3750 g | Freq: Three times a day (TID) | INTRAVENOUS | Status: DC
  Administered 2015-11-10 – 2015-11-12 (×4): 3.375 g via INTRAVENOUS
  Filled 2015-11-10 (×6): qty 50

## 2015-11-10 NOTE — ED Notes (Signed)
Bed: RESB Expected date:  Expected time:  Means of arrival:  Comments: EMS CA pt, dec lOC, not responding to Narcan

## 2015-11-10 NOTE — ED Notes (Signed)
Patient c/o chest pain while labs were being collected. EKG obtained. MD notified.

## 2015-11-10 NOTE — Progress Notes (Signed)
Discussed case with Critical care team who will evaluate and treat patient today. Would like to thank the critical care team for their involvement in this case.  Will take over medical management with improvement in condition.  Tahra Hitzeman, [employer]

## 2015-11-10 NOTE — Progress Notes (Signed)
ANTIBIOTIC CONSULT NOTE - INITIAL  Pharmacy Consult for Vancomycin, Zosyn Indication: rule out sepsis  Allergies  Allergen Reactions  . Doxycycline Nausea And Vomiting    Patient Measurements: Height: 5\' 5"  (165.1 cm) Weight: 161 lb 2.5 oz (73.1 kg) IBW/kg (Calculated) : 57  Vital Signs: Temp: 97.9 F (36.6 C) (11/28 0800) Temp Source: Oral (11/28 0800) BP: 109/72 mmHg (11/28 1000) Pulse Rate: 115 (11/28 1000) Intake/Output from previous day: 11/27 0701 - 11/28 0700 In: 618.4 [I.V.:618.4] Out: -  Intake/Output from this shift: Total I/O In: 305.8 [I.V.:305.8] Out: -   Labs:  Recent Labs  11/12/2015 0119  WBC 14.1*  HGB 7.8*  PLT 225  CREATININE 1.52*   Estimated Creatinine Clearance: 46.8 mL/min (by C-G formula based on Cr of 1.52). No results for input(s): VANCOTROUGH, VANCOPEAK, VANCORANDOM, GENTTROUGH, GENTPEAK, GENTRANDOM, TOBRATROUGH, TOBRAPEAK, TOBRARND, AMIKACINPEAK, AMIKACINTROU, AMIKACIN in the last 72 hours.   Microbiology: Recent Results (from the past 720 hour(s))  MRSA PCR Screening     Status: Abnormal   Collection Time: 11/01/2015  5:47 AM  Result Value Ref Range Status   MRSA by PCR POSITIVE (A) NEGATIVE Final    Comment:        The GeneXpert MRSA Assay (FDA approved for NASAL specimens only), is one component of a comprehensive MRSA colonization surveillance program. It is not intended to diagnose MRSA infection nor to guide or monitor treatment for MRSA infections. RESULT CALLED TO, READ BACK BY AND VERIFIED WITH: RUSSEL RN AT 0820 ON 11.28.16 BY [place]     Medical History: Past Medical History  Diagnosis Date  . Headache(784.0)     Hx: of Migraines  . Anxiety     ANXIOUS ABOUT HAVEING BIG SURGERY - MASTECTOMY  . 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 DVT (deep venous thrombosis) (HCC)     RT ARM-PT ON LOVENOX  . S/P radiation therapy 02-04-14 to 03-18-14                               1) Left Chest Wall / 50 Gy in 25  fractions/ 2) Left Supraclavicular fossa/ 46 Gy in 23 fractions / 3) Left Posterior Axillary boost / 8.533 Gy in 23 fractions/ 4) Left Chest Wall Scar boost / 10 Gy in 5 fractions  . CHF (congestive heart failure) ([place])   . Iron deficiency anemia 11/26/13  . Cancer of left breast ([place])     Invasive High Grade Carcinoma; S/P mastectomy, Adriamycin and Cytoxan, radiation  . Metastases to the liver ([place])   . Metastasis to lung [facility])     Medications:  Anti-infectives    Start     Dose/Rate Route Frequency Ordered Stop   10/21/2015 1230  piperacillin-tazobactam (ZOSYN) IVPB 3.375 g     3.375 g 12.5 mL/hr over 240 Minutes Intravenous STAT 11/02/2015 1215 11/11/15 1230   11/02/2015 1230  vancomycin (VANCOCIN) IVPB 1000 mg/200 mL premix     1,000 mg 200 mL/hr over 60 Minutes Intravenous STAT 11/01/2015 1215 11/11/15 1230     Assessment: 43 yoF admitted on 11/28 with altered mental status.  PMH significant for progressive metastatic breast cancer, mets to lung/liver/brain, CHF, anemia, DVT.  She was found to be hypotensive and Pharmacy is consulted to dose vancomycin and Zosyn for possible sepsis.  Today, 10/14/2015: Tmax/24h: Afebrile, 97.9 WBC: 14.1 (chronic decadron dosing increased) Renal: SCr 1.52 with CrCl ~ 47 ml/min  Goal of Therapy:  Vancomycin trough level 15-20 mcg/ml Appropriate abx dosing, eradication of infection.   Plan:   Zosyn 3.375g IV Q8H infused over 4hrs.   Vancomycin 1g IV once, then 750mg  IV q12h.  Measure Vanc trough at steady state.  Follow up renal fxn, culture results, and clinical course.  Gretta Arab PharmD, BCPS Pager [phone] 10/15/2015 12:17 PM

## 2015-11-10 NOTE — ED Notes (Signed)
Patient transported to CT 

## 2015-11-10 NOTE — Progress Notes (Signed)
COURTESY NOTE:  Greatly appreciate your help to Ms Lick and her companion Clarisse Gouge As you can see from my prior notes, my feeling is any attempt at treatment of her cancer at this point would only make things worse. I suggested hospice and In [place] but Ms [place] felt she could make it with family help.   I would say making her comfortable is the main goal. Agree with DNR order.   The patient's sisters will be coming from [place] and it would be a good idea for palliative care to meet with them-- I do not know how much they know of the patient's recent decline.  SUMMARY: 44 y.o. 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  (0) genetic testing of the ATM, BARD1, BRCA1, BRCA2, BRIP1, CDH1, CHEK2, EPCAM, FANCC, MLH1, MSH2, MSH6, NBN, PALB2, PMS2, PTEN, RAD51C, RAD51D, STK11, TP53, and XRCC2 genes showed no deleterious mutations  (1) status post left breast biopsy 04/23/2013 for a clinical T4 N1, stage IIIB invasive ductal carcinoma, grade 3, estrogen receptor 14% positive with moderate staining intensity, progesterone receptor negative, with an MIB-1 of 100% and HER-2/neu nonamplified  (2) biopsy of a left axillary lymph node [phone] was positive  (3) started monthly goserelin July of 02/01/2013  (4) received neoadjuvant chemotherapy with doxorubicin and cyclophosphamide in dose dense fashion x4, followed by carboplatin and paclitaxel x1, discontinued due to neuropathy, followed by carboplatin and gemcitabine, both given day 1 of each 14 day cycle x6, completed 10/12/2013 .   (5) right upper extremity DVT documented 06/29/2013, treated with Lovenox from July through December 2014   (6) status post left modified radical mastectomy 11/15/2013 for a residual pT3 pN0 invasive ductal carcinoma, grade 3, with repeat prognostic panel triple negative. All 12 axillary lymph nodes were clear. Margins were negative   (5) completed adjuvant radiation  therapy 03/18/2014, with capecitabine added 2-3 weeks into treatment.  METASTATIC DISEASE  (6) CT scans of the chest, abdomen, and pelvis 06/26/2014 showed a large right pleural effusion with pleural studding, 2 left upper lobe nodules, and two liver lesions. Cytology from right thoracentesis 07/03/2014 showed malignant cells consistent with the patient's known primary  (7) started eribulin 07/09/2014, given days 1 and 8 of each 21 day cycle; restagind studies after cycle 3 showed evidence of response. Discontinued after cycle 5 (last dose 10/08/14) because of worsening peripheral neuropathy--also with progression.   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 Antithrombin level was normal.  (12) to start liposomal doxorubicin (Doxil) 05/14/2015, canceled because of low ejection fraction  (13) started capecitabine1.5g BID, 05/20/15, 1 week on, 1 week off-- stopped October 2016 with progression  (14) CHF with echo 05/21/2015 showing an ejection fraction of 25-30%  (15) right occipital brain metastasis noted on brain MRI 09/04/2015 (a) [place] ereceived 09/22/2015 Left parietal 20Gy (with small satellite lesions) Right occipital 20Gy Left parietal 20Gy

## 2015-11-10 NOTE — H&P (Signed)
[facility] History and Physical  Erika [name] [id number] DOB: 1971-06-12 DOA: 10/17/2015  Referring physician: ED PCP: Imelda Pillow, NP   Chief Complaint: Altered mental status   HPI:  Erika [name] is a 44 year old female with a past medical history significant for  breast cancer stage IV( mets to lung, brain), dilated cardiomyopathy, systolic congestive heart failure with EF 25-30% in 08/2015; who presents with altered mental status. History is obtained from the patient's husband who is present at bedside. Patient was reported to be more lethargic with the swelling in her arms that was worsening since this morning. She had tenderness to palpation of her abdomen. Patient during interview at one point  she complains of chest pain substernally.   He notes that she was not her normal self and not responding to him appropriately, and therefore became concerned and had her brought to the hospital.  Upon arrival to the emergency department initial blood pressure was 89/67 and respiratory rate was 36. Initial lab work showed WBC 14.1, hemoglobin 7.8, sodium 131, BUN 34, Cr 1.52. Chest x-ray showed worsening right hemithorax and opacification with probable increase in size of left pulmonary nodules.  Discuss with patient and husband in detail the overall poor prognosis.  It appears that discussions had  been made to talk with hospice, however family and patient were not able to make contact with  them before now. Husband states that we should do everything until family is able to get here to make that decision until then. Family members state that they are coming to the hospital and to try and keep her comfortable.   Patient had received at least 3 L of IV fluids while in the emergency department, but blood pressures still remained relatively low. Then placed while on a rate of 125 mL per hour patient developed chest pain. The fluid rate was decreased to 75 mL per hour, however  patient's MAP  dropped to 51. Patient was started on pressors of dopamine.  A complete review of systems cannot be performed due to the fact that the patient is intermittently in and out of a confused state.  Review of Systems  Constitutional: Positive for chills.  Respiratory: Positive for shortness of breath.   Cardiovascular: Positive for chest pain.        Past Medical History  Diagnosis Date  . Headache(784.0)     Hx: of Migraines  . Anxiety     ANXIOUS ABOUT HAVEING BIG SURGERY - MASTECTOMY  . 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 DVT (deep venous thrombosis) (HCC)     RT ARM-PT ON LOVENOX  . S/P radiation therapy 02-04-14 to 03-18-14                               1) Left Chest Wall / 50 Gy in 25 fractions/ 2) Left Supraclavicular fossa/ 46 Gy in 23 fractions / 3) Left Posterior Axillary boost / 8.533 Gy in 23 fractions/ 4) Left Chest Wall Scar boost / 10 Gy in 5 fractions  . CHF (congestive heart failure) ([place])   . Iron deficiency anemia 11/26/13  . Cancer of left breast ([place])     Invasive High Grade Carcinoma; S/P mastectomy, Adriamycin and Cytoxan, radiation  . Metastases to the liver ([place])   . Metastasis to lung [facility])      Past Surgical History  Procedure Laterality Date  . Portacath placement Right  05/22/2013    Procedure: INSERTION PORT-A-CATH;  Surgeon: [name], MD;  Location: [facility] OR;  Service: General;  Laterality: Right;  . Breast surgery Left 05/21/13; 06/22/13     Needle Core Biopsy  . Mastectomy modified radical Left 11/15/2013    Procedure: LEFT MASTECTOMY MODIFIED RADICAL;  Surgeon: [name], MD;  Location: [facility] ORS;  Service: General;  Laterality: Left;  . Tubal ligation  08/1996      Social History:  reports that she has never smoked. She has never used smokeless tobacco. She reports that she does not drink alcohol or use illicit drugs. Where does patient live--home  and with whom if at home? husband Can patient participate in ADLs?  No  Allergies  Allergen Reactions  . Doxycycline Nausea And Vomiting    Family History  Problem Relation Age of Onset  . Hypertension Mother   . Diabetes Father 42  . Stomach cancer Maternal Aunt   . Hypertension Maternal Grandmother   . Hypertension Maternal Grandfather   . Diabetes Paternal Grandmother   . Diabetes Paternal Grandfather   . Diabetes Paternal Uncle   . Diabetes Paternal Uncle   . Diabetes Paternal Uncle         Prior to Admission medications   Medication Sig Start Date End Date Taking? Authorizing Provider  carvedilol (COREG) 6.25 MG tablet Take 1 tablet (6.25 mg total) by mouth 2 (two) times daily with a meal. 10/31/15  Yes [name], MD  cyclobenzaprine (FLEXERIL) 5 MG tablet TAKE 1 TABLET BY MOUTH THREE TIMES A DAY FOR MUSCLE SPASMS 10/31/15  Yes [name], MD  dexamethasone (DECADRON) 0.5 MG tablet Take 0.5 mg by mouth.   Yes Historical Provider, MD  fentaNYL (DURAGESIC - DOSED MCG/HR) 75 MCG/HR Place 1 patch (75 mcg total) onto the skin every 3 (three) days. 10/31/15  Yes [name], MD  furosemide (LASIX) 20 MG tablet Take 20 mg by mouth daily.  09/12/15  Yes Historical Provider, MD  gabapentin (NEURONTIN) 300 MG capsule Take 1 capsule (300 mg total) by mouth 3 (three) times daily. 04/15/15  Yes [name], MD  loratadine (CLARITIN) 10 MG tablet Take 1 tablet (10 mg total) by mouth daily as needed for allergies. 10/31/15  Yes [name], MD  LORazepam (ATIVAN) 0.5 MG tablet Take 1 tablet (0.5 mg total) by mouth every 6 (six) hours as needed. for anxiety 10/31/15  Yes [name], MD  nystatin (MYCOSTATIN) 100000 UNIT/ML suspension Take 5 mLs (500,000 Units total) by mouth 4 (four) times daily. Swish in mouth as long as possible, then swallow.  Use for 3 weeks. 10/31/15  Yes [name], MD  ondansetron (ZOFRAN) 8 MG tablet Take 1 tablet (8 mg total) by mouth 2 (two) times daily. 10/31/15  Yes [name], MD   oxyCODONE (OXY IR/ROXICODONE) 5 MG immediate release tablet Take 1-2 tablets every 8 hrs as needed for severe or breakthrough pain Patient taking differently: Take 5-10 mg by mouth every 8 (eight) hours as needed for moderate pain. Take 1-2 tablets every 8 hrs as needed for severe or breakthrough pain 10/31/15  Yes [name], MD  potassium chloride SA (K-DUR,KLOR-CON) 20 MEQ tablet Take 1 tablet (20 mEq total) by mouth 4 (four) times daily. 07/01/15  Yes [name], NP  predniSONE (DELTASONE) 10 MG tablet Take 1 tablet (10 mg total) by mouth daily with breakfast. 10/31/15  Yes [name], MD  prochlorperazine (COMPAZINE)  10 MG tablet Take 1 tablet (10 mg total) by mouth every 8 (eight) hours as needed for nausea or vomiting. 10/31/15  Yes [name], MD  warfarin (COUMADIN) 2 MG tablet Take 2.5 mg by mouth every other day.    Yes Historical Provider, MD  zolpidem (AMBIEN) 5 MG tablet Take 1 tablet (5 mg total) by mouth at bedtime as needed for sleep. 06/27/14  Yes Minette Headland, NP  fentaNYL (DURAGESIC - DOSED MCG/HR) 25 MCG/HR patch Place 1 patch (25 mcg total) onto the skin every 3 (three) days. 10/31/15   [name], MD     Physical Exam: Filed Vitals:   10/17/2015 0300 10/31/2015 0321 10/21/2015 0329 11/09/2015 0400  BP: 89/67 87/64  86/46  Pulse:  96 88   Temp:      TempSrc:      Resp: 31  25 36  SpO2:  87% 100%     Constitutional: Vital signs reviewed. Patient is chronically ill and appears toxic. Head: Normocephalic and atraumatic  Ear: TM normal bilaterally  Mouth: no erythema or exudates,  relatively dry mucous membranes Eyes: PERRL, EOMI, conjunctivae normal, scleral icterus present  Neck: Supple, Trachea midline normal ROM, No JVD, mass, thyromegaly, or carotid bruit present.  Cardiovascular:  Distant heart sounds with positive systolic ejection murmur, pulses symmetric and intact bilaterally  Pulmonary/Chest: Bibasilar crackles decreased  inspiratory and expiratory air movement Abdominal: Soft but mildly distended abdomen with positive bowel sounds appreciated. Generalized tenderness to palpation. GU: no CVA tenderness Musculoskeletal: No joint deformities, erythema, or stiffness, ROM full and no nontender Ext: +2 edema of the upper and lower extremities with weak and thready pulses Hematology: no cervical, inginal, or axillary adenopathy.  Neurological: Lethargic, but arousable to verbal commands, Strenght is normal and symmetric bilaterally, cranial nerve II-XII are grossly intact, no focal motor deficit, sensory intact to light touch bilaterally.  Skin: Warm, dry and intact. No rash, cyanosis, or clubbing.  Psychiatric: Confused   Data Review   Micro Results No results found for this or any previous visit (from the past 240 hour(s)).  Radiology Reports Dg Chest 1 View  10/20/2015  CLINICAL DATA:  Worsened upper extremity edema. Unresponsive. Metastatic left breast cancer. EXAM: CHEST 1 VIEW COMPARISON:  10/21/2015 FINDINGS: There is a right subclavian Port-A-Cath which appears unchanged in position. There is worsened right hemi opacification. There are multiple left lung nodules which appear slightly larger than on 10/21/2015. IMPRESSION: Worsened right hemithorax opacification, and probable increase in size of left pulmonary nodules. Electronically Signed   By: Andreas Newport M.D.   On: 10/26/2015 02:17   Dg Chest 2 View  10/21/2015  CLINICAL DATA:  Sob, weakness, hx breast cancer, possibly for a thoracentesis if free fluid is present EXAM: CHEST  2 VIEW COMPARISON:  08/17/2015 FINDINGS: Cardiac contours are obscured by significant right-sided pleural effusion. Right-sided PowerPort tip overlies the level of the superior vena cava, unchanged. Right lung opacity is significantly. Small amount of aerated lung persists in the apex. Nodular appearance of pleura at the lung apex, consistent with metastatic disease. Numerous  metastases again identified throughout the left lung. Previous left mastectomy and axillary node dissection. Note is made of mottled appearance of right lateral ribs suggestive of metastatic disease. IMPRESSION: 1. Significant increase in right-sided opacity. Considerations include increased pleural effusion or progression of metastatic disease. 2. Further evaluation is CT of the chest is recommended if thoracentesis is being considered. 3. Nodular appearance of the pleura consistent  with metastatic disease. Electronically Signed   By: Nolon Nations M.D.   On: 10/21/2015 17:06   Ct Head Wo Contrast  11/07/2015  CLINICAL DATA:  Altered mental status.  Known metastatic disease. EXAM: CT HEAD WITHOUT CONTRAST TECHNIQUE: Contiguous axial images were obtained from the base of the skull through the vertex without intravenous contrast. COMPARISON:  MRI 09/12/2015 FINDINGS: There is a homogeneously hyperdense 8.5 mm mass in the left cerebellar hemisphere with moderate edema. This was not evident on the 09/12/2015 MRI. The hyperdensity of the lesions suggests there could be internal hemorrhage within the mass although there is no evidence of hemorrhage out into the brain parenchyma. There may also be a 12 mm mass in the medial left temporal lobe, but this is not conclusive. There is marginal visibility of the known right occipital metastasis. There is no extra-axial blood or fluid collection. There is mild generalized atrophy. There is no significant mass effect. There is no midline shift. There is no other significant change from the 09/12/2015 MRI. IMPRESSION: 1. Hyperdense 8.5 mm left cerebellar mass with mild surrounding edema. This could represent a metastatic deposit which has undergone internal hemorrhage. This was not evident on the recent MRI. 2. Equivocal medial left temporal lobe mass which would also be new from the 09/12/2015 MRI. 3. No significant mass effect, midline shift or extra-axial collection.  Electronically Signed   By: Andreas Newport M.D.   On: 11/07/2015 02:06   [place] Chest  10/23/2015  CLINICAL DATA:  Abnormal chest x-ray.  Possible pleural fluid. EXAM: CHEST ULTRASOUND COMPARISON:  Two-view chest x-ray 10/21/2015 FINDINGS: No significant pleural fluid is present. IMPRESSION: No significant fluid. Electronically Signed   By: San Morelle M.D.   On: 10/23/2015 13:23     CBC  Recent Labs Lab 11/03/2015 0119  WBC 14.1*  HGB 7.8*  HCT 25.8*  PLT 225  MCV 84.6  MCH 25.6*  MCHC 30.2  RDW 22.9*  LYMPHSABS 1.3  MONOABS 0.8  EOSABS 0.0  BASOSABS 0.0    Chemistries   Recent Labs Lab 11/11/2015 0119  NA 131*  K 4.8  CL 100*  CO2 21*  GLUCOSE 112*  BUN 34*  CREATININE 1.52*  CALCIUM 7.9*  AST 123*  ALT 33  ALKPHOS 239*  BILITOT 1.3*   ------------------------------------------------------------------------------------------------------------------ estimated creatinine clearance is 42.1 mL/min (by C-G formula based on Cr of 1.52). ------------------------------------------------------------------------------------------------------------------ No results for input(s): HGBA1C in the last 72 hours. ------------------------------------------------------------------------------------------------------------------ No results for input(s): CHOL, HDL, LDLCALC, TRIG, CHOLHDL, LDLDIRECT in the last 72 hours. ------------------------------------------------------------------------------------------------------------------ No results for input(s): TSH, T4TOTAL, T3FREE, THYROIDAB in the last 72 hours.  Invalid input(s): FREET3 ------------------------------------------------------------------------------------------------------------------ No results for input(s): VITAMINB12, FOLATE, FERRITIN, TIBC, IRON, RETICCTPCT in the last 72 hours.  Coagulation profile  Recent Labs Lab 11/05/2015 0119  INR 3.21*    No results for input(s): DDIMER in the last 72  hours.  Cardiac Enzymes No results for input(s): CKMB, TROPONINI, MYOGLOBIN in the last 168 hours.  Invalid input(s): CK ------------------------------------------------------------------------------------------------------------------ Invalid input(s): POCBNP   CBG:  Recent Labs Lab 10/30/2015 0117  GLUCAP 105*       EKG: Independently reviewed. Showing  sinus rhythm with borderline low voltage extremity leads and nonspecific T-wave abnormalities.    Assessment/Plan Principal Problem:  Acute encephalopathy: Acute secondary most likely to worsening metastatic disease versus a combination given dehydration  with the patient's acute kidney injury on arrival. CT scan of the brain showing acute new masses with surrounding edema.  -  Overall poor prognosis. Discussed in detail with the family regarding patient's current presentation and the likely need of aggressive measures. Husband stated that he would like to wait until family can come before making her a DO NOT RESUSCITATE or discussing comfort care. -Consulted palliative care  Hypotension: Acute. Patient with systolic blood pressures upon arrival in the 80s. Despite receiving 3 L of normal saline fluid and a fluid rate then at 125 mL per hour systolic blood pressures remained in the 80s.  -transfer to ICU -Start pressors of dopamine  -Critical care consulted     Breast cancer metastasized to multiple sites: Acutely worsen as seen on patient's recent chest x-ray and CT of the head performed today  Chronic systolic CHF (congestive heart failure): EF 25-30%. Likely patient aggressive fluid rehydration and maintain blood pressures cannot be maintained for a long.  Neoplasm related pain -Managing patient's hypotension have to be conservative with pain.discussed this with patient's family in detail     Supratherapeutic INR wit Long term current use of anticoagulant therapy INR was 3.3  -hold warfarin  Leukocytosis secondary to  patient's underlying disease process :     AKI (acute kidney injury) ([place]) -IV fluids    Code Status:   full Family Communication: bedside Disposition Plan: admit   Total time spent 55 minutes.Greater than 50% of this time was spent in counseling, explanation of diagnosis, planning of further management, and coordination of care  [facility] Pager [phone]  If 7PM-7AM, please contact night-coverage [email or web address] Password [email or web address] 10/28/2015, 4:30 AM

## 2015-11-10 NOTE — Care Management Note (Signed)
Case Management Note  Patient Details  Name: Erika [name] MRN: [id number] Date of Birth: [birth year]  Subjective/Objective:              End of life issues versus sepsis      Action/Plan:Date: November 10, 2015 Chart reviewed for concurrent status and case management needs. Will continue to follow patient for changes and needs: Velva Harman, RN, BSN, [place]   [phone]  Expected Discharge Date:                  Expected Discharge Plan:  Home w Hospice Care  In-House Referral:  Clinical Social Work  Discharge planning Services  CM Consult  Post Acute Care Choice:  NA Choice offered to:  NA  DME Arranged:    DME Agency:     HH Arranged:    [place] Agency:     Status of Service:  In process, will continue to follow  Medicare Important Message Given:    Date Medicare IM Given:    Medicare IM give by:    Date Additional Medicare IM Given:    Additional Medicare Important Message give by:     If discussed at [place] of Stay Meetings, dates discussed:    Additional Comments:  Leeroy Cha, RN 10/31/2015, 10:20 AM

## 2015-11-10 NOTE — ED Notes (Signed)
Assisted patient onto bedpan with the assistance of [facility] EMT.

## 2015-11-10 NOTE — ED Notes (Signed)
Pt to ct 

## 2015-11-10 NOTE — Consult Note (Signed)
Name: Erika [name] MRN: [id number] DOB: 02/08/1971    ADMISSION DATE:  11/09/2015 CONSULTATION DATE:  11/28  REFERRING MD :  Wendee Beavers  CHIEF COMPLAINT:  Shock   BRIEF PATIENT DESCRIPTION:   44 year old female w/ progressive Metastatic breast cancer  In spite of therapy. Presents to ED 11/28 w/ acute encephalopathy and hypotension.   SIGNIFICANT EVENTS    STUDIES:  CT head:   HISTORY OF PRESENT ILLNESS:   44 year old female with a past medical history significant for breast cancer stage IV( mets to lung, brain), dilated cardiomyopathy, systolic congestive heart failure with EF 25-30% in 08/2015; who presented 11/28 with altered mental status and hypotension.  Patient was reported to be more lethargic with the swelling in her arms that was worsening since the am of 11/28. She had tenderness to palpation of her abdomen. Upon arrival to the emergency department initial blood pressure was 89/67 and respiratory rate was 36. Initial lab work showed WBC 14.1, hemoglobin 7.8, sodium 131, BUN 34, Cr 1.52. Chest x-ray showed worsening right hemithorax. She was s/p at least 3 L of IV fluids while in the emergency department, but blood pressures still remained relatively low so dopamine infusion was started. PCCM was asked to see on 11/28 as pt was pressor dependent Of note:  > w/c bound at baseline > progressive metastatic disease involving:  large right pleural effusion with pleural studding, 2 left upper lobe nodules, and two liver lesions. Cytology from right thoracentesis 07/03/2014 showed malignant cells consistent with the patient's known primary.   PAST MEDICAL HISTORY :   has a past medical history of Headache(784.0); Anxiety; Neuropathy ([place]); DVT (deep venous thrombosis) ([place]); S/P radiation therapy (02-04-14 to 03-18-14                           ); CHF (congestive heart failure) ([place]); Iron deficiency anemia (11/26/13); Cancer of left breast ([place]); Metastases to the liver [facility]); and  Metastasis to lung [facility]).  has past surgical history that includes Portacath placement (Right, 05/22/2013); Breast surgery (Left, 05/21/13; 06/22/13); Mastectomy modified radical (Left, 11/15/2013); and Tubal ligation (08/1996). Prior to Admission medications   Medication Sig Start Date End Date Taking? Authorizing Provider  carvedilol (COREG) 6.25 MG tablet Take 1 tablet (6.25 mg total) by mouth 2 (two) times daily with a meal. 10/31/15  Yes [name], MD  cyclobenzaprine (FLEXERIL) 5 MG tablet TAKE 1 TABLET BY MOUTH THREE TIMES A DAY FOR MUSCLE SPASMS 10/31/15  Yes [name], MD  dexamethasone (DECADRON) 0.5 MG tablet Take 0.5 mg by mouth.   Yes Historical Provider, MD  fentaNYL (DURAGESIC - DOSED MCG/HR) 75 MCG/HR Place 1 patch (75 mcg total) onto the skin every 3 (three) days. 10/31/15  Yes [name], MD  furosemide (LASIX) 20 MG tablet Take 20 mg by mouth daily.  09/12/15  Yes Historical Provider, MD  gabapentin (NEURONTIN) 300 MG capsule Take 1 capsule (300 mg total) by mouth 3 (three) times daily. 04/15/15  Yes [name], MD  loratadine (CLARITIN) 10 MG tablet Take 1 tablet (10 mg total) by mouth daily as needed for allergies. 10/31/15  Yes [name], MD  LORazepam (ATIVAN) 0.5 MG tablet Take 1 tablet (0.5 mg total) by mouth every 6 (six) hours as needed. for anxiety 10/31/15  Yes [name], MD  nystatin (MYCOSTATIN) 100000 UNIT/ML suspension Take 5 mLs (500,000 Units total) by mouth 4 (four) times  daily. Swish in mouth as long as possible, then swallow.  Use for 3 weeks. 10/31/15  Yes [name], MD  ondansetron (ZOFRAN) 8 MG tablet Take 1 tablet (8 mg total) by mouth 2 (two) times daily. 10/31/15  Yes [name], MD  oxyCODONE (OXY IR/ROXICODONE) 5 MG immediate release tablet Take 1-2 tablets every 8 hrs as needed for severe or breakthrough pain Patient taking differently: Take 5-10 mg by mouth every 8 (eight) hours as needed for moderate pain.  Take 1-2 tablets every 8 hrs as needed for severe or breakthrough pain 10/31/15  Yes [name], MD  potassium chloride SA (K-DUR,KLOR-CON) 20 MEQ tablet Take 1 tablet (20 mEq total) by mouth 4 (four) times daily. 07/01/15  Yes [name], NP  predniSONE (DELTASONE) 10 MG tablet Take 1 tablet (10 mg total) by mouth daily with breakfast. 10/31/15  Yes [name], MD  prochlorperazine (COMPAZINE) 10 MG tablet Take 1 tablet (10 mg total) by mouth every 8 (eight) hours as needed for nausea or vomiting. 10/31/15  Yes [name], MD  warfarin (COUMADIN) 2 MG tablet Take 2.5 mg by mouth every other day.    Yes Historical Provider, MD  zolpidem (AMBIEN) 5 MG tablet Take 1 tablet (5 mg total) by mouth at bedtime as needed for sleep. 06/27/14  Yes Minette Headland, NP  fentaNYL (DURAGESIC - DOSED MCG/HR) 25 MCG/HR patch Place 1 patch (25 mcg total) onto the skin every 3 (three) days. 10/31/15   [name], MD   Allergies  Allergen Reactions  . Doxycycline Nausea And Vomiting    FAMILY HISTORY:  family history includes Diabetes in her paternal grandfather, paternal grandmother, paternal uncle, paternal uncle, and paternal uncle; Diabetes (age of onset: 74) in her father; Hypertension in her maternal grandfather, maternal grandmother, and mother; Stomach cancer in her maternal aunt. SOCIAL HISTORY:  reports that she has never smoked. She has never used smokeless tobacco. She reports that she does not drink alcohol or use illicit drugs.  REVIEW OF SYSTEMS:   Confused and not able to participate  SUBJECTIVE:  Sitting in bed, no distress. Confused and impulsive   VITAL SIGNS: Temp:  [97.4 F (36.3 C)-97.9 F (36.6 C)] 97.9 F (36.6 C) (11/28 0800) Pulse Rate:  [85-131] 115 (11/28 1000) Resp:  [10-36] 26 (11/28 1000) BP: (68-110)/(22-82) 109/72 mmHg (11/28 1000) SpO2:  [87 %-100 %] 99 % (11/28 1000) Weight:  [73.1 kg (161 lb 2.5 oz)] 73.1 kg (161 lb 2.5 oz) (11/28  0530)  PHYSICAL EXAMINATION: General:  Chronically ill appearing female, sitting up in bed, not in distress Neuro:  Awake, impulsive, no focal def Moves all ext, confused HEENT:  NCAT, no JVD Cardiovascular:  Rrr. No MRG Lungs:  Crackles bilaterally, decreased on right  Abdomen:  Soft, not tender + bowel sounds  Musculoskeletal:  Intact w/ good st Skin:  Warm and dry   Recent Labs Lab 10/19/2015 0119  NA 131*  K 4.8  CL 100*  CO2 21*  BUN 34*  CREATININE 1.52*  GLUCOSE 112*    Recent Labs Lab 10/23/2015 0119  HGB 7.8*  HCT 25.8*  WBC 14.1*  PLT 225    Recent Labs Lab 11/12/2015 0119 11/02/2015 0121 10/23/2015 0432  WBC 14.1*  --   --   LATICACIDVEN  --  3.11* 2.98*   Dg Chest 1 View  10/29/2015  CLINICAL DATA:  Worsened upper extremity edema. Unresponsive. Metastatic left breast cancer. EXAM:  CHEST 1 VIEW COMPARISON:  10/21/2015 FINDINGS: There is a right subclavian Port-A-Cath which appears unchanged in position. There is worsened right hemi opacification. There are multiple left lung nodules which appear slightly larger than on 10/21/2015. IMPRESSION: Worsened right hemithorax opacification, and probable increase in size of left pulmonary nodules. Electronically Signed   By: Andreas Newport M.D.   On: 11/09/2015 02:17   Ct Head Wo Contrast  11/05/2015  CLINICAL DATA:  Altered mental status.  Known metastatic disease. EXAM: CT HEAD WITHOUT CONTRAST TECHNIQUE: Contiguous axial images were obtained from the base of the skull through the vertex without intravenous contrast. COMPARISON:  MRI 09/12/2015 FINDINGS: There is a homogeneously hyperdense 8.5 mm mass in the left cerebellar hemisphere with moderate edema. This was not evident on the 09/12/2015 MRI. The hyperdensity of the lesions suggests there could be internal hemorrhage within the mass although there is no evidence of hemorrhage out into the brain parenchyma. There may also be a 12 mm mass in the medial left temporal  lobe, but this is not conclusive. There is marginal visibility of the known right occipital metastasis. There is no extra-axial blood or fluid collection. There is mild generalized atrophy. There is no significant mass effect. There is no midline shift. There is no other significant change from the 09/12/2015 MRI. IMPRESSION: 1. Hyperdense 8.5 mm left cerebellar mass with mild surrounding edema. This could represent a metastatic deposit which has undergone internal hemorrhage. This was not evident on the recent MRI. 2. Equivocal medial left temporal lobe mass which would also be new from the 09/12/2015 MRI. 3. No significant mass effect, midline shift or extra-axial collection. Electronically Signed   By: Andreas Newport M.D.   On: 10/29/2015 02:06    ASSESSMENT / PLAN:  Shock. Sepsis vs Hypovolemia vs relative adrenal insuff. Certainly could be element of all three Plan Cont dopamine gtt. Goal SBP >65 Cont IVFs Send cultures BC and Urine Start empiric ABX Stress dose steroids (have increased decadron to 4 mg q6 )  Acute encephalopathy: possibly sepsis vs disease progression w/ new mets to brain. CT showed  left Cerebellar mass which is new since 9/30 w/ surrounding edema. Also has new left temporal mass.  Plan Increased steroids Heme/onc consult  Dyspnea w/ known lung metastasis and prior malignant effusion. She just had [place] on 11th which showed no fluid so suggests that likely has disease progression and now tumor burden resulting in airway obstruction.  Plan Repeat bedside US  AKI w/ lactic acidosis  Plan Cont IVFs Renal dose meds Avoid hypotension if able  Progressive stage IV metastatic breast cancer w/ mets to brain, lungs, pleura, abd and bones.  Plan Heme/onc consult Palliative care consult Have spoken w/ her sig other. Will await daughter to further define limitations, but think DNR status appropriate. But still reasonable to try to treat reversible problems.   Anemia of  chronic disease  Plan xfuse for hgb < 7 Trend cbc Limit labs   Cancer related Pain Plan PRN analgesia   H/o DVT w/ coumadin induced coagulopathy Plan Coumadin per pharmacy   [facility] [place] Pager: [phone]  10/21/2015, 10:49 AM  STAFF NOTE: I, Merrie Roof, MD FACP have personally reviewed patient's available data, including medical history, events of note, physical examination and test results as part of my evaluation. I have discussed with resident/NP and other care providers such as pharmacist, RN and RRT. In addition, I personally evaluated patient and elicited key  findings of: no sig distress, egophony and bronchial on right chest examination, slow to respond overall, her pcxr findings are collapse and NOT effusion and progression of this horrible cancer, d/w daughter , clearly does not want heroics, full No code written and limits to care , pressors, add dopamine to map goal 60, max at 10 mics, no additional pressors will be added as would be futile, add vanc, zosyn for post obstructive PNA concerns, no lasix, add fluids, use port for access, could assess cvp if needed also, follow renal fxn, control pain, if declines initiate comfort care, bolus  Critical Care Time devoted to patient care services described in this note is 35 Minutes. This time reflects time of care of this signee: Merrie Roof, MD FACP. This critical care time does not reflect procedure time, or teaching time or supervisory time of PA/NP/Med student/Med Resident etc but could involve care discussion time. Rest per NP/medical resident whose note is outlined above and that I agree with   Lavon Paganini. Titus Mould, MD, [place] Pgr: [place] [facility] 10/26/2015 12:14 PM

## 2015-11-10 NOTE — Procedures (Signed)
U chest   1. Small effusion left, lung aeration normal 2. Left chest is ATX, small pockets of surrounding effusion, , raised diaphragm, collapse  Nothing to tap  Lavon Paganini. Titus Mould, MD, [place] Pgr: [place] [facility]

## 2015-11-10 NOTE — ED Notes (Signed)
Hospitalist at bedside 

## 2015-11-10 NOTE — ED Notes (Signed)
EKG given to Dr Tamala Julian. Dr Tamala Julian to bedside. Patient indicates her chest hurts in her central chest by pointing at it. MD orders given to decreased IV fluid rate to 67ml/hr.

## 2015-11-10 NOTE — ED Notes (Signed)
Breast cancer patient from home via EMS. Pt was nonresponsive to verbal cues, but was responsive to pain. Pt had 2 19mcg  fentanyl patch and had shallow respirations. Pt was given 2 of narcan. Pt was sinus in route.

## 2015-11-10 NOTE — Progress Notes (Signed)
ANTICOAGULATION CONSULT NOTE - Initial Consult  Pharmacy Consult for Warfarin Indication: DVT  Allergies  Allergen Reactions  . Doxycycline Nausea And Vomiting    Patient Measurements: Height: 5\' 5"  (165.1 cm) Weight: 161 lb 2.5 oz (73.1 kg) IBW/kg (Calculated) : 57  Vital Signs: Temp: 98 F (36.7 C) (11/28 1200) Temp Source: Oral (11/28 1200) BP: 91/71 mmHg (11/28 1430) Pulse Rate: 106 (11/28 1430)  Labs:  Recent Labs  10/31/2015 0119  HGB 7.8*  HCT 25.8*  PLT 225  LABPROT 32.2*  INR 3.21*  CREATININE 1.52*    Estimated Creatinine Clearance: 46.8 mL/min (by C-G formula based on Cr of 1.52).   Medical History: Past Medical History  Diagnosis Date  . Headache(784.0)     Hx: of Migraines  . Anxiety     ANXIOUS ABOUT HAVEING BIG SURGERY - MASTECTOMY  . 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 DVT (deep venous thrombosis) ([facility])     RT ARM-PT ON LOVENOX  . S/P radiation therapy 02-04-14 to 03-18-14                               1) Left Chest Wall / 50 Gy in 25 fractions/ 2) Left Supraclavicular fossa/ 46 Gy in 23 fractions / 3) Left Posterior Axillary boost / 8.533 Gy in 23 fractions/ 4) Left Chest Wall Scar boost / 10 Gy in 5 fractions  . CHF (congestive heart failure) ([place])   . Iron deficiency anemia 11/26/13  . Cancer of left breast ([place])     Invasive High Grade Carcinoma; S/P mastectomy, Adriamycin and Cytoxan, radiation  . Metastases to the liver ([place])   . Metastasis to lung [facility])     Medications:  Scheduled:  . Chlorhexidine Gluconate Cloth  6 each Topical Q0600  . dexamethasone  4 mg Intravenous 4 times per day  . fentaNYL  25 mcg Transdermal Q72H  . mupirocin ointment  1 application Nasal BID  . piperacillin-tazobactam (ZOSYN)  IV  3.375 g Intravenous STAT  . piperacillin-tazobactam (ZOSYN)  IV  3.375 g Intravenous Q8H  . sodium chloride  3 mL Intravenous Q12H  . vancomycin  1,000 mg Intravenous STAT  . [START ON 11/11/2015] vancomycin  750 mg  Intravenous Q12H   Infusions:  . sodium chloride 1,000 mL (11/04/2015 1022)  . DOPamine 10 mcg/kg/min (10/15/2015 1330)    Assessment: 79 yoF admitted on 11/28 with altered mental status. PMH significant for progressive metastatic breast cancer, mets to lung/liver/brain, CHF, anemia, and RUE DVT on chronic warfarin anticoagulation.  Home warfarin dose if reported as 2.5mg  daily, last dose on 11/27.  Admission INR is elevated at 3.2.  No bleeding or complications noted.  Pharmacy is consulted to dose warfarin inpatient.  Today, 10/26/2015: INR 3.21 Hgb  7.8 (low/decreased, baseline ~ 9-10), Plt remain WNL No bleeding or complications noted. Drug-drug interactions: broad spectrum antibiotics may prolong INR  Goal of Therapy:  INR 2-3 Monitor platelets by anticoagulation protocol: Yes   Plan:   Hold warfarin today  Pharmacy to resume warfarin dosing when INR < 3  Daily INR  Gretta Arab PharmD, BCPS Pager [phone] 10/26/2015 2:46 PM

## 2015-11-10 NOTE — ED Notes (Signed)
Pt repositioned by this RN and respiratory.

## 2015-11-10 NOTE — Progress Notes (Addendum)
Long discussion w/ pt's daughter and significant other. They understand that the cancer has progressed. They do not want Erika [name] to suffer. We have agreed to treat things that can be reversed such as infection and hypovolemia. Will also add decadron for mild vasogenic edema. Will cont current infusion, but will not add second pressor & if worsens would transition to comfort.  Plan abx Steroids IV hydration Titrate dopamine (max 10 mcg/kg/min) DNR Early transition to palliation should she deteriorate  Erika [name] ACNP-BC [place] Pager # [phone] OR # [phone] if no answer

## 2015-11-10 NOTE — ED Provider Notes (Signed)
CSN: [id number]     Arrival date & time 11/04/2015  0014 History  By signing my name below, I, Jolayne Panther, attest that this documentation has been prepared under the direction and in the presence of Delora Fuel, MD. Electronically Signed: Jolayne Panther, Scribe. 10/28/2015. 12:55 AM.    Chief Complaint  Patient presents with  . Altered Mental Status    CA patient    The history is provided by the spouse. The history is limited by the condition of the patient. No language interpreter was used.    HPI Comments: Limited due to Level 5 Caveat  Erika [name] is a 44 y.o. female who presents to the Emergency Department per her husband complaining of unresponsiveness and edema to the pt's upper extremities bilaterally which is worse than normal onset this morning. Pt's husband also reports a decrease in her appetite but reports she has been eating a lot of fruit and applesauce. She also exhibits tenderness in her abdomen. Her husband denies the pt experiencing fevers, chills, sweats, nausea, vomiting, and diarrhea.   Past Medical History  Diagnosis Date  . Headache(784.0)     Hx: of Migraines  . Anxiety     ANXIOUS ABOUT HAVEING BIG SURGERY - MASTECTOMY  . 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 DVT (deep venous thrombosis) (HCC)     RT ARM-PT ON LOVENOX  . S/P radiation therapy 02-04-14 to 03-18-14                               1) Left Chest Wall / 50 Gy in 25 fractions/ 2) Left Supraclavicular fossa/ 46 Gy in 23 fractions / 3) Left Posterior Axillary boost / 8.533 Gy in 23 fractions/ 4) Left Chest Wall Scar boost / 10 Gy in 5 fractions  . CHF (congestive heart failure) ([place])   . Iron deficiency anemia 11/26/13  . Cancer of left breast ([place])     Invasive High Grade Carcinoma; S/P mastectomy, Adriamycin and Cytoxan, radiation  . 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 Metastasis to lung [facility])    Past Surgical History  Procedure Laterality Date  . Portacath placement Right  05/22/2013    Procedure: INSERTION PORT-A-CATH;  Surgeon: [name], MD;  Location: [facility] OR;  Service: General;  Laterality: Right;  . Breast surgery Left 05/21/13; 06/22/13     Needle Core Biopsy  . Mastectomy modified radical Left 11/15/2013    Procedure: LEFT MASTECTOMY MODIFIED RADICAL;  Surgeon: [name], MD;  Location: [facility] ORS;  Service: General;  Laterality: Left;  . Tubal ligation  08/1996   Family History  Problem Relation Age of Onset  . Hypertension Mother   . Diabetes Father 41  . Stomach cancer Maternal Aunt   . Hypertension Maternal Grandmother   . Hypertension Maternal Grandfather   . Diabetes Paternal Grandmother   . Diabetes Paternal Grandfather   . Diabetes Paternal Uncle   . Diabetes Paternal Uncle   . Diabetes Paternal Uncle    Social History  Substance Use Topics  . Smoking status: Never Smoker   . Smokeless tobacco: Never Used  . Alcohol Use: No   OB History    No data available     Review of Systems  Reason unable to perform ROS: Level 5 Caveat   Constitutional: Positive for appetite change.  Musculoskeletal:       Edema of upper extremities  bilaterally  All other systems reviewed and are negative.     Allergies  Doxycycline  Home Medications   Prior to Admission medications   Medication Sig Start Date End Date Taking? Authorizing Provider  carvedilol (COREG) 6.25 MG tablet Take 1 tablet (6.25 mg total) by mouth 2 (two) times daily with a meal. 10/31/15   [name], MD  cyclobenzaprine (FLEXERIL) 5 MG tablet TAKE 1 TABLET BY MOUTH THREE TIMES A DAY FOR MUSCLE SPASMS 10/31/15   [name], MD  dexamethasone (DECADRON) 0.5 MG tablet Take 0.5 mg by mouth.    Historical Provider, MD  fentaNYL (DURAGESIC - DOSED MCG/HR) 25 MCG/HR patch Place 1 patch (25 mcg total) onto the skin every 3 (three) days. Patient not taking: Reported on 10/31/2015 10/31/15   [name], MD  fentaNYL (DURAGESIC - DOSED MCG/HR) 75 MCG/HR  Place 1 patch (75 mcg total) onto the skin every 3 (three) days. Patient not taking: Reported on 10/31/2015 10/31/15   [name], MD  furosemide (LASIX) 20 MG tablet  09/12/15   Historical Provider, MD  gabapentin (NEURONTIN) 300 MG capsule Take 1 capsule (300 mg total) by mouth 3 (three) times daily. 04/15/15   [name], MD  loratadine (CLARITIN) 10 MG tablet Take 1 tablet (10 mg total) by mouth daily as needed for allergies. 10/31/15   [name], MD  LORazepam (ATIVAN) 0.5 MG tablet Take 1 tablet (0.5 mg total) by mouth every 6 (six) hours as needed. for anxiety 10/31/15   [name], MD  nystatin (MYCOSTATIN) 100000 UNIT/ML suspension Take 5 mLs (500,000 Units total) by mouth 4 (four) times daily. Swish in mouth as long as possible, then swallow.  Use for 3 weeks. 10/31/15   [name], MD  ondansetron (ZOFRAN) 8 MG tablet Take 1 tablet (8 mg total) by mouth 2 (two) times daily. 10/31/15   [name], MD  oxyCODONE (OXY IR/ROXICODONE) 5 MG immediate release tablet Take 1-2 tablets every 8 hrs as needed for severe or breakthrough pain Patient not taking: Reported on 10/31/2015 10/31/15   [name], MD  potassium chloride SA (K-DUR,KLOR-CON) 20 MEQ tablet Take 1 tablet (20 mEq total) by mouth 4 (four) times daily. 07/01/15   [name], NP  predniSONE (DELTASONE) 10 MG tablet Take 1 tablet (10 mg total) by mouth daily with breakfast. 10/31/15   [name], MD  prochlorperazine (COMPAZINE) 10 MG tablet Take 1 tablet (10 mg total) by mouth every 8 (eight) hours as needed for nausea or vomiting. 10/31/15   [name], MD  warfarin (COUMADIN) 2 MG tablet Take 1 mg by mouth daily.    Historical Provider, MD  zolpidem (AMBIEN) 5 MG tablet Take 1 tablet (5 mg total) by mouth at bedtime as needed for sleep. 06/27/14   Minette Headland, NP   BP 68/52 mmHg  Pulse 100  Resp 12  SpO2 100%  LMP 05/30/2013 Physical Exam  Constitutional: She  is oriented to person, place, and time. She appears well-developed and well-nourished. No distress.  Level 5 Caveat Somnolent but arousable      HENT:  Head: Normocephalic and atraumatic.  Eyes: EOM are normal. Pupils are equal, round, and reactive to light.  Neck: Normal range of motion. Neck supple. No JVD present.  Cardiovascular: Normal rate, regular rhythm and normal heart sounds.   No murmur heard. Pulmonary/Chest: Effort normal. She has no wheezes. She has no rales. She  exhibits no tenderness.  MetaPort present in right anterior chest wall Lungs: Decreased breath sounds on the right  Abdominal: Soft. Bowel sounds are normal. She exhibits no distension and no mass. There is no tenderness.  Musculoskeletal: Normal range of motion. She exhibits no edema.  2+ edema of the right arm 3+ edema of left arm No tenderness   Lymphadenopathy:    She has no cervical adenopathy.  Neurological: She is alert and oriented to person, place, and time. No cranial nerve deficit. She exhibits normal muscle tone. Coordination normal.  Somnolent but arousable Oriented to person and place when aroused No focal neurologic findings Able to answer questions  Skin: Skin is warm and dry. No rash noted.  Nursing note and vitals reviewed.   ED Course  Procedures  DIAGNOSTIC STUDIES:    Oxygen Saturation is 100% on RA, normal by my interpretation.   COORDINATION OF CARE:  12:39 AM Will order CT scan of pt's brain. Discussed treatment plan with pt at bedside and pt agreed to plan.     Labs Review Results for orders placed or performed during the hospital encounter of 11/03/2015  Comprehensive metabolic panel  Result Value Ref Range   Sodium 131 (L) 135 - 145 mmol/L   Potassium 4.8 3.5 - 5.1 mmol/L   Chloride 100 (L) 101 - 111 mmol/L   CO2 21 (L) 22 - 32 mmol/L   Glucose, Bld 112 (H) 65 - 99 mg/dL   BUN 34 (H) 6 - 20 mg/dL   Creatinine, Ser 1.52 (H) 0.44 - 1.00 mg/dL   Calcium 7.9 (L) 8.9 -  10.3 mg/dL   Total Protein 5.7 (L) 6.5 - 8.1 g/dL   Albumin 1.5 (L) 3.5 - 5.0 g/dL   AST 123 (H) 15 - 41 U/L   ALT 33 14 - 54 U/L   Alkaline Phosphatase 239 (H) 38 - 126 U/L   Total Bilirubin 1.3 (H) 0.3 - 1.2 mg/dL   GFR calc non Af Amer 40 (L) >60 mL/min   GFR calc Af Amer 47 (L) >60 mL/min   Anion gap 10 5 - 15  CBC  Result Value Ref Range   WBC 14.1 (H) 4.0 - 10.5 K/uL   RBC 3.05 (L) 3.87 - 5.11 MIL/uL   Hemoglobin 7.8 (L) 12.0 - 15.0 g/dL   HCT 25.8 (L) 36.0 - 46.0 %   MCV 84.6 78.0 - 100.0 fL   MCH 25.6 (L) 26.0 - 34.0 pg   MCHC 30.2 30.0 - 36.0 g/dL   RDW 22.9 (H) 11.5 - 15.5 %   Platelets 225 150 - 400 K/uL  Urinalysis, Routine w reflex microscopic  Result Value Ref Range   Color, Urine ORANGE (A) YELLOW   APPearance CLOUDY (A) CLEAR   Specific Gravity, Urine 1.022 1.005 - 1.030   pH 5.0 5.0 - 8.0   Glucose, UA NEGATIVE NEGATIVE mg/dL   Hgb urine dipstick NEGATIVE NEGATIVE   Bilirubin Urine MODERATE (A) NEGATIVE   Ketones, ur NEGATIVE NEGATIVE mg/dL   Protein, ur 30 (A) NEGATIVE mg/dL   Nitrite NEGATIVE NEGATIVE   Leukocytes, UA TRACE (A) NEGATIVE  Urine rapid drug screen (hosp performed)  Result Value Ref Range   Opiates POSITIVE (A) NONE DETECTED   Cocaine NONE DETECTED NONE DETECTED   Benzodiazepines POSITIVE (A) NONE DETECTED   Amphetamines NONE DETECTED NONE DETECTED   Tetrahydrocannabinol NONE DETECTED NONE DETECTED   Barbiturates NONE DETECTED NONE DETECTED  Ammonia  Result Value Ref Range  Ammonia 27 9 - 35 umol/L  Protime-INR  Result Value Ref Range   Prothrombin Time 32.2 (H) 11.6 - 15.2 seconds   INR 3.21 (H) 0.00 - 1.49  Differential  Result Value Ref Range   Neutrophils Relative % 85 %   Lymphocytes Relative 9 %   Monocytes Relative 6 %   Eosinophils Relative 0 %   Basophils Relative 0 %   Neutro Abs 12.0 (H) 1.7 - 7.7 K/uL   Lymphs Abs 1.3 0.7 - 4.0 K/uL   Monocytes Absolute 0.8 0.1 - 1.0 K/uL   Eosinophils Absolute 0.0 0.0 - 0.7 K/uL    Basophils Absolute 0.0 0.0 - 0.1 K/uL   RBC Morphology POLYCHROMASIA PRESENT   Urine microscopic-add on  Result Value Ref Range   Squamous Epithelial / LPF 0-5 (A) NONE SEEN   WBC, UA 0-5 0 - 5 WBC/hpf   RBC / HPF NONE SEEN 0 - 5 RBC/hpf   Bacteria, UA FEW (A) NONE SEEN   Casts HYALINE CASTS (A) NEGATIVE   Urine-Other AMORPHOUS URATES/PHOSPHATES   CBG monitoring, ED  Result Value Ref Range   Glucose-Capillary 105 (H) 65 - 99 mg/dL  I-Stat CG4 Lactic Acid, ED  Result Value Ref Range   Lactic Acid, Venous 3.11 (HH) 0.5 - 2.0 mmol/L   Comment NOTIFIED PHYSICIAN    *Note: Due to a large number of results and/or encounters for the requested time period, some results have not been displayed. A complete set of results can be found in Results Review.    Imaging Review Dg Chest 1 View  10/24/2015  CLINICAL DATA:  Worsened upper extremity edema. Unresponsive. Metastatic left breast cancer. EXAM: CHEST 1 VIEW COMPARISON:  10/21/2015 FINDINGS: There is a right subclavian Port-A-Cath which appears unchanged in position. There is worsened right hemi opacification. There are multiple left lung nodules which appear slightly larger than on 10/21/2015. IMPRESSION: Worsened right hemithorax opacification, and probable increase in size of left pulmonary nodules. Electronically Signed   By: Andreas Newport M.D.   On: 10/19/2015 02:17   Ct Head Wo Contrast  10/14/2015  CLINICAL DATA:  Altered mental status.  Known metastatic disease. EXAM: CT HEAD WITHOUT CONTRAST TECHNIQUE: Contiguous axial images were obtained from the base of the skull through the vertex without intravenous contrast. COMPARISON:  MRI 09/12/2015 FINDINGS: There is a homogeneously hyperdense 8.5 mm mass in the left cerebellar hemisphere with moderate edema. This was not evident on the 09/12/2015 MRI. The hyperdensity of the lesions suggests there could be internal hemorrhage within the mass although there is no evidence of hemorrhage out  into the brain parenchyma. There may also be a 12 mm mass in the medial left temporal lobe, but this is not conclusive. There is marginal visibility of the known right occipital metastasis. There is no extra-axial blood or fluid collection. There is mild generalized atrophy. There is no significant mass effect. There is no midline shift. There is no other significant change from the 09/12/2015 MRI. IMPRESSION: 1. Hyperdense 8.5 mm left cerebellar mass with mild surrounding edema. This could represent a metastatic deposit which has undergone internal hemorrhage. This was not evident on the recent MRI. 2. Equivocal medial left temporal lobe mass which would also be new from the 09/12/2015 MRI. 3. No significant mass effect, midline shift or extra-axial collection. Electronically Signed   By: Andreas Newport M.D.   On: 10/30/2015 02:06   I have personally reviewed and evaluated these images and lab results as part  of my medical decision-making.   EKG Interpretation   Date/Time:  Monday November 10 2015 04:15:41 EST Ventricular Rate:  86 PR Interval:  140 QRS Duration: 72 QT Interval:  474 QTC Calculation: 567 R Axis:   42 Text Interpretation:  Sinus rhythm Borderline low voltage, extremity leads  Nonspecific T abnormalities, lateral leads Prolonged QT interval When  compared with ECG of 09/12/2015, QT has lengthened T wave inversion is no  longer Present Anterolateral leads Confirmed by [facility]  MD, Asheton Viramontes ([id number])  on 10/21/2015 4:23:46 AM      CRITICAL CARE Performed by: [id number] Total critical care time: 85 minutes Critical care time was exclusive of separately billable procedures and treating other patients. Critical care was necessary to treat or prevent imminent or life-threatening deterioration. Critical care was time spent personally by me on the following activities: development of treatment plan with patient and/or surrogate as well as nursing, discussions with consultants,  evaluation of patient's response to treatment, examination of patient, obtaining history from patient or surrogate, ordering and performing treatments and interventions, ordering and review of laboratory studies, ordering and review of radiographic studies, pulse oximetry and re-evaluation of patient's condition.  MDM   Final diagnoses:  Somnolence  Breast cancer metastasized to brain, left (HCC)  Malignant pleural effusion  Acute kidney injury (nontraumatic) (HCC)  Hyponatremia  Elevated liver function tests  Supratherapeutic INR  Normochromic normocytic anemia    Altered mental status in patient with known metastatic breast cancer including brain metastasis. She had some response to Noroxin indicating some of this may be related to narcotic use. She does wear 2 fentanyl patches. Old records are reviewed confirming metastatic breast cancer including brain metastases and malignant right pleural effusion. She is not eating any active treatment. Last office visit hospice care and DO NOT RESUSCITATE was recommended but apparently patient family not acted on this recommendation. She is initially hypotensive images given aggressive IV hydration with transient improvement and blood pressure but blood pressure has come back down to high [id number] systolic. She states awake and alert with this. Lactic acid was moderately elevated. Laboratory workup was significant for supratherapeutic INR and evidence of acute kidney injury. CT shows new left cerebellar lesion with surrounding edema is strongly suggestive of metastatic disease with internal hemorrhage and reactive edema. She was given dexamethasone to try to treat cerebral edema. Have discussed findings with the patient's husband. She apparently is having significant progression of disease and has very poor short-term prognosis. I did inquire about CODE STATUS but he isn't calling to commit to DO NOT RESUSCITATE status at this point. Case is discussed with Dr.  Tamala Julian of [facility] who has agreed to come to the ED to admit the patient.  I personally performed the services described in this documentation, which was scribed in my presence. The recorded information has been reviewed and is accurate.      Delora Fuel, MD [id number]

## 2015-11-11 DIAGNOSIS — R570 Cardiogenic shock: Secondary | ICD-10-CM

## 2015-11-11 LAB — PROTIME-INR
INR: 3.28 — ABNORMAL HIGH (ref 0.00–1.49)
Prothrombin Time: 32.7 seconds — ABNORMAL HIGH (ref 11.6–15.2)

## 2015-11-11 MED ORDER — ALTEPLASE 2 MG IJ SOLR
2.0000 mg | Freq: Once | INTRAMUSCULAR | Status: AC
  Administered 2015-11-11: 2 mg
  Filled 2015-11-11: qty 2

## 2015-11-11 MED ORDER — OLANZAPINE 5 MG PO TABS
5.0000 mg | ORAL_TABLET | Freq: Every day | ORAL | Status: DC
  Administered 2015-11-11: 5 mg via ORAL
  Filled 2015-11-11 (×2): qty 1

## 2015-11-11 MED ORDER — LORAZEPAM 1 MG PO TABS
1.0000 mg | ORAL_TABLET | ORAL | Status: DC | PRN
Start: 2015-11-11 — End: 2015-11-13

## 2015-11-11 MED ORDER — FLUDROCORTISONE ACETATE 0.1 MG PO TABS
0.1000 mg | ORAL_TABLET | Freq: Two times a day (BID) | ORAL | Status: DC
  Administered 2015-11-11 (×2): 0.1 mg via ORAL
  Filled 2015-11-11 (×6): qty 1

## 2015-11-11 MED ORDER — FENTANYL 75 MCG/HR TD PT72
75.0000 ug | MEDICATED_PATCH | TRANSDERMAL | Status: DC
  Administered 2015-11-11 – 2015-11-14 (×2): 75 ug via TRANSDERMAL
  Filled 2015-11-11 (×2): qty 1

## 2015-11-11 MED ORDER — CHLORHEXIDINE GLUCONATE CLOTH 2 % EX PADS
6.0000 | MEDICATED_PAD | Freq: Every morning | CUTANEOUS | Status: DC
  Administered 2015-11-11 – 2015-11-13 (×3): 6 via TOPICAL

## 2015-11-11 MED ORDER — PREGABALIN 50 MG PO CAPS
50.0000 mg | ORAL_CAPSULE | Freq: Two times a day (BID) | ORAL | Status: DC
Start: 2015-11-11 — End: 2015-11-13
  Administered 2015-11-11 – 2015-11-13 (×2): 50 mg via ORAL
  Filled 2015-11-11 (×3): qty 1

## 2015-11-11 MED ORDER — SODIUM CHLORIDE 0.9 % IV SOLN
90.0000 mg | Freq: Once | INTRAVENOUS | Status: AC
  Administered 2015-11-11: 90 mg via INTRAVENOUS
  Filled 2015-11-11: qty 10

## 2015-11-11 MED ORDER — ALBUMIN HUMAN 25 % IV SOLN
50.0000 g | Freq: Two times a day (BID) | INTRAVENOUS | Status: DC
  Administered 2015-11-11: 50 g via INTRAVENOUS
  Filled 2015-11-11: qty 200

## 2015-11-11 MED ORDER — ALTEPLASE 2 MG IJ SOLR
2.0000 mg | Freq: Once | INTRAMUSCULAR | Status: DC
  Filled 2015-11-11: qty 2

## 2015-11-11 MED ORDER — HYDROMORPHONE HCL 1 MG/ML IJ SOLN
1.0000 mg | INTRAMUSCULAR | Status: DC | PRN
  Administered 2015-11-11 – 2015-11-14 (×11): 1 mg via INTRAVENOUS
  Filled 2015-11-11 (×11): qty 1

## 2015-11-11 NOTE — Progress Notes (Signed)
eLink Physician-Brief Progress Note Patient Name: Erika [name] DOB: [birth year] MRN: [id number]   Date of Service  11/11/2015  HPI/Events of Note  Patient with R chest Port-A-Cath. Port will flush, however, no return blood flow.  eICU Interventions  Will order: 1. Cathflow X 1 now.      Intervention Category Intermediate Interventions: Other:  Lysle Dingwall 11/11/2015, 4:25 PM

## 2015-11-11 NOTE — Progress Notes (Signed)
   11/11/15 1200  What Happened  Was fall witnessed? No  Was patient injured? No  Patient found on floor  Found by Staff-comment Jenny Reichmann and Langley Gauss)  Stated prior activity to/from bed, chair, or stretcher  Follow Up  MD notified Marni Griffon, NP  Time MD notified 1202  Family notified Yes-comment  Time family notified 1145  Additional tests No  Adult Fall Risk Assessment  Risk Factor Category (scoring not indicated) Fall has occurred during this admission (document High fall risk)  Patient's Fall Risk High Fall Risk (>13 points)  Adult Fall Risk Interventions  Required Bundle Interventions *See Row Information* High fall risk - low, moderate, and high requirements implemented  Additional Interventions Bed alarm not indicated with the bundle;Secure all tubes/drains  Fall with Injury Screening  Risk For Fall Injury- See Row Information  F  Intervention(s) for 2 or more risk criteria identified Low Bed;Gait Belt  Vitals  BP (!) 76/54 mmHg  MAP (mmHg) 61  ECG Heart Rate 97  Resp 19  Oxygen Therapy  SpO2 93 %  O2 Device Room Air  Neurological  Neuro (WDL) X  Level of Consciousness Alert  Orientation Level Oriented to person;Oriented to place;Oriented to situation  Cognition Appropriate judgement;Follows commands;Poor safety awareness  Speech Clear;Delayed responses  Pupil Assessment  Yes  R Pupil Size (mm) 4  R Pupil Shape Round  R Pupil Reaction Brisk  L Pupil Size (mm) 4  L Pupil Shape Round  L Pupil Reaction Brisk  Additional Pupil Assessments No  Motor Function/Sensation Assessment Grip  R Hand Grip Present;Moderate  L Hand Grip Present;Moderate   Neuro Symptoms Drowsiness  Glasgow Coma Scale  Eye Opening 4  Best Verbal Response (NON-intubated) 4 (Pt does not answer questions at times)  Best Motor Response 6  Musculoskeletal  Musculoskeletal (WDL) X  Assistive Device MaxiMove  Generalized Weakness Yes  Weight Bearing Restrictions No  Integumentary   Integumentary (WDL) X  Skin Color Appropriate for ethnicity  Skin Condition Dry  Skin Integrity Intact

## 2015-11-11 NOTE — Progress Notes (Signed)
Name: Erika [name] MRN: [id number] DOB: 12/05/1971    ADMISSION DATE:  11/12/2015 CONSULTATION DATE:  11/28  REFERRING MD :  Erika [name]  CHIEF COMPLAINT:  Shock   BRIEF PATIENT DESCRIPTION:   44 year old female w/ progressive Metastatic breast cancer  In spite of therapy. Presents to ED 11/28 w/ acute encephalopathy and hypotension.   SIGNIFICANT EVENTS  11/28: transferred to [facility] service from IM. Started on MIVFs, dop gtt cont. Pan cultured and abx started. Goals of care discussed. Limitations placed. Family understands she is terminal and that the cancer has progressed.  11/28: remains dopamine dependent. Erika a little better. C/o pain. Transitioned to focusing on pain management   STUDIES:  CT head 11/28: 1. Hyperdense 8.5 mm left cerebellar mass with mild surrounding edema. This could represent a metastatic deposit which has undergone internal hemorrhage. This was not evident on the recent MRI. 2. Equivocal medial left temporal lobe mass which would also be new from the 09/12/2015 MRI.  SUBJECTIVE:  Less impulsive. No distress.    VITAL SIGNS: Temp:  [97.6 F (36.4 C)-98 F (36.7 C)] 97.7 F (36.5 C) (11/29 0815) Pulse Rate:  [92-115] 104 (11/29 0815) Resp:  [12-27] 25 (11/29 0902) BP: (78-109)/(50-85) 102/50 mmHg (11/29 0815) SpO2:  [98 %-100 %] 100 % (11/29 0815) Weight:  [75.3 kg (166 lb 0.1 oz)] 75.3 kg (166 lb 0.1 oz) (11/29 0600)  PHYSICAL EXAMINATION: General:  Chronically ill appearing female, sitting up in chair, not in distress Neuro:  Awake, less impulsive, affect flat. no focal def Moves all ext, less confused; oriented X2-3 HEENT:  NCAT, no JVD Cardiovascular:  Rrr. No MRG Lungs:  Crackles bilaterally, decreased on right, no accessory muscle use  Abdomen:  Soft, not tender + bowel sounds  Musculoskeletal:  Intact w/ good st Skin:  Warm and dry   Recent Labs Lab 10/14/2015 0119  NA 131*  K 4.8  CL 100*  CO2 21*  BUN 34*  CREATININE 1.52*  GLUCOSE 112*     Recent Labs Lab 11/04/2015 0119  HGB 7.8*  HCT 25.8*  WBC 14.1*  PLT 225    Recent Labs Lab 10/30/2015 0119 11/04/2015 0121 10/21/2015 0432  WBC 14.1*  --   --   LATICACIDVEN  --  3.11* 2.98*   Dg Chest 1 View  10/29/2015  CLINICAL DATA:  Worsened upper extremity edema. Unresponsive. Metastatic left breast cancer. EXAM: CHEST 1 VIEW COMPARISON:  10/21/2015 FINDINGS: There is a right subclavian Port-A-Cath which appears unchanged in position. There is worsened right hemi opacification. There are multiple left lung nodules which appear slightly larger than on 10/21/2015. IMPRESSION: Worsened right hemithorax opacification, and probable increase in size of left pulmonary nodules. Electronically Signed   By: Erika [name] M.D.   On: 10/30/2015 02:17   Ct Head Wo Contrast  11/01/2015  CLINICAL DATA:  Altered mental status.  Known metastatic disease. EXAM: CT HEAD WITHOUT CONTRAST TECHNIQUE: Contiguous axial images were obtained from the base of the skull through the vertex without intravenous contrast. COMPARISON:  MRI 09/12/2015 FINDINGS: There is a homogeneously hyperdense 8.5 mm mass in the left cerebellar hemisphere with moderate edema. This was not evident on the 09/12/2015 MRI. The hyperdensity of the lesions suggests there could be internal hemorrhage within the mass although there is no evidence of hemorrhage out into the brain parenchyma. There may also be a 12 mm mass in the medial left temporal lobe, but this is not conclusive. There is marginal visibility  of the known right occipital metastasis. There is no extra-axial blood or fluid collection. There is mild generalized atrophy. There is no significant mass effect. There is no midline shift. There is no other significant change from the 09/12/2015 MRI. IMPRESSION: 1. Hyperdense 8.5 mm left cerebellar mass with mild surrounding edema. This could represent a metastatic deposit which has undergone internal hemorrhage. This was not  evident on the recent MRI. 2. Equivocal medial left temporal lobe mass which would also be new from the 09/12/2015 MRI. 3. No significant mass effect, midline shift or extra-axial collection. Electronically Signed   By: Erika [name] M.D.   On: 11/12/2015 02:06    ASSESSMENT / PLAN:  Shock. Sepsis vs Hypovolemia vs relative adrenal insuff. Still on dopamine. Not convinced she is infected.  Plan Cont dopamine gtt. Goal SBP >85; will not taper back up.  Cont IVFs follow cultures BC and Urine cont empiric ABX; (started vanc and zosyn 11/28, will probably stop vanc on 11/30 if no culture data to suggest SA).  Stress dose steroids (have increased decadron to 4 mg q6 )  Acute encephalopathy: favor this is r/t disease progression w/ new mets to brain. CT showed  left Cerebellar mass which is new since 9/30 w/ surrounding edema. Also has new left temporal mass.  Plan Increased steroids  Dyspnea w/ known lung metastasis and prior malignant effusion. Repeat bedside US on 11/28 showed no fluid so suggests that likely has disease progression and now tumor burden resulting in airway obstruction.  Plan OOB as able O2 as needed.   AKI w/ lactic acidosis  Plan Cont IVFs Renal dose meds Limit labs   Progressive stage IV metastatic breast cancer w/ mets to brain, lungs, pleura, abd and bones. -->appreciate Dr Erika [name]'s input   Plan Palliative care consult Continue supportive care   Anemia of chronic disease  Plan xfuse for hgb < 7 Trend cbc Limit labs   Cancer related Pain Plan PRN analgesia-->will not trade comfort for BP management   H/o DVT w/ coumadin induced coagulopathy Plan Coumadin per pharmacy   Erika [name] over-all is slightly improved from a LOC stand-point. Think the majority of her issues are cancer progression and FTT related & that aggressive therapy may fix numbers but not really help her. She complains of pain, and this should be a priority as I discussed w/ her  daughter yesterday. For today we will continue to focus on pain, attempt to wean dopamine to off, even if this means accept lower BP as long as Erika will support it. Ultimately I suspect we will be transitioning to full palliative mode. The family is prepared for this. Palliative care has been consulted. I have read Dr Virgie Dad note but my sense is that the daughter would be open palliative care support once she has a full understanding of the benefit they can provide the patient and family.   Erick Colace ACNP-BC [place] Pager # [phone] OR # [phone] if no answer   11/11/2015, 9:40 AM  Attending:  I have seen and examined the patient with nurse practitioner/resident and agree with the note above.  My edits are in BOLD above.  We formulated the plan together and I elicited the following history.    She is fatigued today, dyspneic, but feels a little better today  On exam No breath sounds on the R, clear on the left, normal respiratory effort CV: RRR, no mgr Derm: massive edema all over  CXR images reviewed>  white out right lung PET CT images reviewed> thick pleural and lung masses on R  Respiratory distress> given thick lung masses on R, there is no fluid to drain, and she is not responding to cancer treatment which would be the only viable option.  Will need to focus on opiates for relief of dyspnea.  Have explained this to the patient and her husband today Shock> unclear if she is infected or not, we are treating empirically but suspect this is more failure to thrive with a bad heart, perhaps some degree of adrenal insufficiency.  She is not really getting any mineralocorticoid effect so will add florinef today.  Wean off of dopamine and focus more on comfort rather than vital signs.  Re-iterated the patient and her husband today that our goals are for her comfort and dignity primarily.  I think we need to actively transition to palliative care only.   Palliative consult appreciated  My cc time 31 minutes  Roselie Awkward, MD [place] [facility] Pager: [phone] Cell: [phone] After 3pm or if no response, call [phone]

## 2015-11-11 NOTE — Progress Notes (Signed)
Port-a-cath reaccessed by IV team X 2, no blood return. Dopamine on hold due to loss of IV access. Pt's arms are severely swollen, + 4 edema in bilateral UE; cannot insert a peripheral IV.  Order received from tPA.

## 2015-11-11 NOTE — Progress Notes (Signed)
Had difficulty pulling blood from the port-a-cath. After multiple attempts, was able to pull just enough blood for the lab.

## 2015-11-11 NOTE — Progress Notes (Signed)
[facility] for Warfarin Indication: Hx left renal vein thrombosis  Allergies  Allergen Reactions  . Doxycycline Nausea And Vomiting    Patient Measurements: Height: 5\' 5"  (165.1 cm) Weight: 166 lb 0.1 oz (75.3 kg) IBW/kg (Calculated) : 57  Vital Signs: Temp: 97.8 F (36.6 C) (11/29 0347) Temp Source: Oral (11/29 0347) BP: 102/50 mmHg (11/29 0815) Pulse Rate: 104 (11/29 0815)  Labs:  Recent Labs  10/20/2015 0119  HGB 7.8*  HCT 25.8*  PLT 225  LABPROT 32.2*  INR 3.21*  CREATININE 1.52*    Estimated Creatinine Clearance: 47.4 mL/min (by C-G formula based on Cr of 1.52).   Medical History: Past Medical History  Diagnosis Date  . Headache(784.0)     Hx: of Migraines  . Anxiety     ANXIOUS ABOUT HAVEING BIG SURGERY - MASTECTOMY  . 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 DVT (deep venous thrombosis) (HCC)     RT ARM-PT ON LOVENOX  . S/P radiation therapy 02-04-14 to 03-18-14                               1) Left Chest Wall / 50 Gy in 25 fractions/ 2) Left Supraclavicular fossa/ 46 Gy in 23 fractions / 3) Left Posterior Axillary boost / 8.533 Gy in 23 fractions/ 4) Left Chest Wall Scar boost / 10 Gy in 5 fractions  . CHF (congestive heart failure) ([place])   . Iron deficiency anemia 11/26/13  . Cancer of left breast ([place])     Invasive High Grade Carcinoma; S/P mastectomy, Adriamycin and Cytoxan, radiation  . Metastases to the liver ([place])   . Metastasis to lung [facility])     Medications:  Scheduled:  . Chlorhexidine Gluconate Cloth  6 each Topical q morning - 10a  . dexamethasone  4 mg Intravenous 4 times per day  . fentaNYL  25 mcg Transdermal Q72H  . mupirocin ointment  1 application Nasal BID  . piperacillin-tazobactam (ZOSYN)  IV  3.375 g Intravenous Q8H  . sodium chloride  3 mL Intravenous Q12H  . vancomycin  750 mg Intravenous Q12H   Infusions:  . sodium chloride 1,000 mL (11/11/15 0800)  . DOPamine 10 mcg/kg/min  (11/11/15 0800)    Assessment: 49 yoF admitted on 11/28 with altered mental status. 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  Home warfarin dose reported as 2.5mg  daily, last dose on 11/27.  Admission INR is elevated at 3.2.  No bleeding or complications noted.  Pharmacy is consulted to dose warfarin inpatient.  Today, 11/11/2015: INR 3.28, remains supra-therapeutic despite holding warfarin doses Last CBC 911/28): Hgb  7.8 (baseline ~ 9-10), Plt remain WNL No bleeding or complications noted. Drug-drug interactions: broad spectrum antibiotics may prolong INR  Goal of Therapy:  INR 2-3 Monitor platelets by anticoagulation protocol: Yes   Plan:   Hold warfarin today  Pharmacy to resume warfarin dosing when INR < 3  Daily INR  Gretta Arab PharmD, BCPS Pager [phone] 11/11/2015 9:01 AM

## 2015-11-11 NOTE — Progress Notes (Signed)
Nutrition Brief Note  Pt identified as at nutrition risk on the Malnutrition Screen Tool  Chart reviewed. Pt now transitioning to comfort care.  No further nutrition interventions warranted at this time.  Please consult as needed.   Jeiden Daughtridge, MS, RD, LDN Pager: [phone] After Hours Pager: [phone]  

## 2015-11-11 NOTE — Progress Notes (Signed)
Around 1115, pt's family requested that pt be moved from chair back to bed. Pt who is alert and oriented to self, place, and situation, refused to go back to bed when I told her about going back to bed. Upon assessment, she reported that she was in pain and "just wanted this to go quickly." Could not get pt to explain what she meant, but pt was visibly in pain and anxious. Per pt's request, she was adjusted in the chair and received medications for pain and anxiety at 1119. I stayed in the room for at few minutes after that. I briefly left the room to go help another nurse, and when I came back at 11:30, pt was on the floor surrounded by other staff members. Pt's family was notified of the fall, and her husband was upset about the situation.   12:02 PM: MD is notified of the fall; no new order received at this time. Will continue to monitor pt.

## 2015-11-11 NOTE — Progress Notes (Signed)
Aredia dose and rate verified with pharmacy. Confirmed med is to be give over 2 hours

## 2015-11-11 NOTE — Consult Note (Addendum)
Family was not able to travel to [place] today- I met with patient and her husband Brenton Grills- he is actively grieving.   Summary and recommendations   1. DNR 2. Not agreeable to Hospice right now- has had bad experience in the past- we agreed to focus on her comfort as our primary goal for now and moving out of the ICU- he is going to think about advantages of comfort focus and moving to 3Oncology med surg- allowing for rest and less invasive monitoring- which is the most reasonable next step- then we can work with him on hospice options or how patient and family want to move forward- she is extremely fragile. 3. Hypotention is a concern right now and I addressed the complexity of this- and challenge of continuous monitoring- She has an albumin of <2 so a trial of Albumin infusion for 24-48 hours would be reasonable based on their goals and may provide [place] more room to effectively titrate her pain medication. Stop dopamine and do not restart. 4. Severe- has been a major struggle to achieve comfort- compared to what she was getting at home she is significantly under dosed during this hospitalization- methadone would have been a good option give her extensive disease but she has a prolonged QT and high risk for cardiac complications with it.   Start Duragesic at 62mg  Hydromorphone IV for pain q2 PRN  Start Lyrica BID  Give infusion of Zometa  Decadron at max dose already  5. Agitation:  Start Xyprexa- will help with her asthenia  Increased Ativan to [id number] prn  Allow for a period of sleep and minimal disturbance this PM.  Full consult note to follow.  ElLane HackerDO Palliative Medicine

## 2015-11-11 NOTE — Progress Notes (Signed)
Palliative consult received family coming in from out of town this afternoon 11/29. I will meet with them and assist with goals of care and help with transition to comfort care when they are ready. Appreciate thoughtful care by oncology and CCM. Husband to let nurse know what time family will arrive-please page Dr. Hilma Favors when family arrive [phone].  Lane Hacker, DO Palliative Medicine

## 2015-11-11 NOTE — Progress Notes (Signed)
IV team reassessed the port-a-cath s/p tPA. Still no blood return. MD notified of the situation and the dopamine drip being on hold. Second order received for tPA. MD will send someone to assess the situation.

## 2015-11-12 ENCOUNTER — Ambulatory Visit (HOSPITAL_COMMUNITY): Admission: RE | Admit: 2015-11-12 | Payer: No Typology Code available for payment source | Source: Ambulatory Visit

## 2015-11-12 DIAGNOSIS — J910 Malignant pleural effusion: Secondary | ICD-10-CM

## 2015-11-12 LAB — BASIC METABOLIC PANEL
ANION GAP: 9 (ref 5–15)
BUN: 33 mg/dL — ABNORMAL HIGH (ref 6–20)
CALCIUM: 8 mg/dL — AB (ref 8.9–10.3)
CO2: 16 mmol/L — AB (ref 22–32)
CREATININE: 1.42 mg/dL — AB (ref 0.44–1.00)
Chloride: 107 mmol/L (ref 101–111)
GFR, EST AFRICAN AMERICAN: 51 mL/min — AB (ref >60)
GFR, EST NON AFRICAN AMERICAN: 44 mL/min — AB (ref >60)
Glucose, Bld: 95 mg/dL (ref 65–99)
Potassium: 4.5 mmol/L (ref 3.5–5.1)
SODIUM: 132 mmol/L — AB (ref 135–145)

## 2015-11-12 LAB — CBC
HCT: 25.7 % — ABNORMAL LOW (ref 36.0–46.0)
HEMOGLOBIN: 7.6 g/dL — AB (ref 12.0–15.0)
MCH: 25 pg — ABNORMAL LOW (ref 26.0–34.0)
MCHC: 29.6 g/dL — ABNORMAL LOW (ref 30.0–36.0)
MCV: 84.5 fL (ref 78.0–100.0)
PLATELETS: 218 10*3/uL (ref 150–400)
RBC: 3.04 MIL/uL — AB (ref 3.87–5.11)
RDW: 23.3 % — ABNORMAL HIGH (ref 11.5–15.5)
WBC: 13.1 10*3/uL — AB (ref 4.0–10.5)

## 2015-11-12 LAB — PROTIME-INR
INR: 3.67 — AB (ref 0.00–1.49)
PROTHROMBIN TIME: 35.6 s — AB (ref 11.6–15.2)

## 2015-11-12 MED ORDER — OLANZAPINE 5 MG PO TABS
2.5000 mg | ORAL_TABLET | Freq: Every day | ORAL | Status: DC
  Filled 2015-11-12: qty 1

## 2015-11-12 NOTE — Progress Notes (Signed)
[facility] for Warfarin Indication: Hx left renal vein thrombosis  Patient Measurements: Height: 5\' 5"  (165.1 cm) Weight: 166 lb 0.1 oz (75.3 kg) IBW/kg (Calculated) : 57  Vital Signs: Temp: 97.7 F (36.5 C) (11/30 0800) Temp Source: Oral (11/30 0800) BP: 82/53 mmHg (11/30 0700) Pulse Rate: 104 (11/30 0700)  Labs:  Recent Labs  10/16/2015 0119 11/11/15 0938 11/12/15 0350  HGB 7.8*  --  7.6*  HCT 25.8*  --  25.7*  PLT 225  --  218  LABPROT 32.2* 32.7* 35.6*  INR 3.21* 3.28* 3.67*  CREATININE 1.52*  --  1.42*   Estimated Creatinine Clearance: 50.8 mL/min (by C-G formula based on Cr of 1.42).  Assessment: 76 yoF admitted on 11/28 with altered mental status. 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  Home warfarin dose reported as 2.5mg  daily, last dose on 11/27.  Admission INR is elevated at 3.2.  No bleeding or complications noted.  Pharmacy is consulted to dose warfarin inpatient.  Today, 11/12/2015:  INR 3.67, increasing and supra-therapeutic despite holding warfarin doses  CBC:  Hgb  7.6 (baseline ~ 9-10), Plt remain WNL  No bleeding or complications noted.  Drug-drug interactions: broad spectrum antibiotics may prolong INR  Goal of Therapy:  INR 2-3 Monitor platelets by anticoagulation protocol: Yes   Plan:   Per discussion with CCM, plan to d/c warfarin therapy.  D/c daily INR  Gretta Arab PharmD, BCPS Pager [phone] 11/12/2015 9:43 AM

## 2015-11-12 NOTE — Progress Notes (Signed)
Name: Erika [name] MRN: [id number] DOB: 1971/01/17    ADMISSION DATE:  11/06/2015 CONSULTATION DATE:  11/28  REFERRING MD :  Wendee Beavers  CHIEF COMPLAINT:  Shock   BRIEF PATIENT DESCRIPTION:   44 year old female w/ progressive Metastatic breast cancer  In spite of therapy. Presents to ED 11/28 w/ acute encephalopathy and hypotension.   SIGNIFICANT EVENTS  11/28: transferred to [facility] service from IM. Started on MIVFs, dop gtt cont. Pan cultured and abx started. Goals of care discussed. Limitations placed. Family understands she is terminal and that the cancer has progressed.  11/28: remains dopamine dependent. MS a little better. C/o pain. Transitioned to focusing on pain management   STUDIES:  CT head 11/28: 1. Hyperdense 8.5 mm left cerebellar mass with mild surrounding edema. This could represent a metastatic deposit which has undergone internal hemorrhage. This was not evident on the recent MRI. 2. Equivocal medial left temporal lobe mass which would also be new from the 09/12/2015 MRI.  SUBJECTIVE:  Less responsive   VITAL SIGNS: Temp:  [97.5 F (36.4 C)-98.4 F (36.9 C)] 97.7 F (36.5 C) (11/30 0800) Pulse Rate:  [87-108] 104 (11/30 0700) Resp:  [0-33] 16 (11/30 0700) BP: (67-93)/(39-72) 82/53 mmHg (11/30 0700) SpO2:  [92 %-100 %] 95 % (11/30 0700)  PHYSICAL EXAMINATION: General:  Chronically ill appearing female, sitting up in chair, not in distress Neuro:  Awake, less impulsive, affect flat. Less interactive  HEENT:  NCAT, no JVD Cardiovascular:  Rrr. No MRG Lungs:  Crackles bilaterally, decreased on right, no accessory muscle use  Abdomen:  Soft, not tender + bowel sounds  Musculoskeletal:  Intact w/ good st Skin:  Warm and dry   Recent Labs Lab 10/28/2015 0119 11/12/15 0350  NA 131* 132*  K 4.8 4.5  CL 100* 107  CO2 21* 16*  BUN 34* 33*  CREATININE 1.52* 1.42*  GLUCOSE 112* 95    Recent Labs Lab 10/27/2015 0119 11/12/15 0350  HGB 7.8* 7.6*  HCT 25.8*  25.7*  WBC 14.1* 13.1*  PLT 225 218    Recent Labs Lab 11/11/2015 0119 11/01/2015 0121 10/20/2015 0432 11/12/15 0350  WBC 14.1*  --   --  13.1*  LATICACIDVEN  --  3.11* 2.98*  --    No results found.  ASSESSMENT / PLAN:  Shock. Sepsis vs Hypovolemia vs relative adrenal insuff. Still on dopamine. Not convinced she is infected.  Plan Cont IVFs Dc vanc D/c zosyn 12/1 if no new culture data Stress dose steroids (have increased decadron to 4 mg q6 ) w/ florinef  No further treatment of Hypotension (discussed w/ family-->concern here is now QOL D/c albumin.   Acute encephalopathy: favor this is r/t disease progression w/ new mets to brain. CT showed  left Cerebellar mass which is new since 9/30 w/ surrounding edema. Also has new left temporal mass. Zyprexa started 11/29. May be responsible for a little over-sedation.  Plan Increased steroids Decrease zyprexa to 2.5 Defer pain management to palliative   Dyspnea w/ known lung metastasis and prior malignant effusion. Repeat bedside US on 11/28 showed no fluid so suggests that likely has disease progression and now tumor burden resulting in airway obstruction.  Plan OOB as able O2 as needed.   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 -->appreciate Dr Magrinat's input   Plan Palliative care consult Continue  supportive care   Anemia of chronic disease  Plan xfuse for hgb < 7 Trend cbc Limit labs   Cancer related Pain Plan PRN analgesia-->will not trade comfort for BP management   H/o DVT w/ coumadin induced coagulopathy Plan Dc coumadin, will be impossible to dose given malnutrition + anemic.  Focus on comfort.   Majority of her issues are cancer progression and FTT related & that aggressive therapy may fix numbers but not really help her. Have discussed w/ significant other that day to day  comfort and not numbers such as BP are the focus of our care at this point. He is concerned that she is over-sedated currently. This may be the zyprexa. Will start by decreasing to 2.5mg /d. Otherwise nothing for [place] to add from a critical care stand-point. Family knows that nothing else really medically to offer here except comfort management. She can go to the medical oncology ward, will ask IM to resume her care. She remains DNR, no escalation of care and focus on comfort.   Erick Colace ACNP-BC [place] Pager # [phone] OR # [phone] if no answer

## 2015-11-13 DIAGNOSIS — Z515 Encounter for palliative care: Secondary | ICD-10-CM

## 2015-11-13 MED ORDER — BISACODYL 10 MG RE SUPP: 10.0000 mg | Freq: Every day | RECTAL | Status: DC | PRN

## 2015-11-13 MED ORDER — LORAZEPAM 2 MG/ML IJ SOLN: 1.0000 mg | INTRAMUSCULAR | Status: DC | PRN

## 2015-11-13 NOTE — Progress Notes (Signed)
Patient seen together with palliative care Dr. Hilma Favors, now minimal responsive, family is resistance to hospice service. Currently on full comfort measure, appreciate palliative team input.

## 2015-11-13 NOTE — Progress Notes (Signed)
Daily Progress Note   Patient Name: Erika [name]       Date: 11/13/2015 DOB: Nov 08, 1971  Age: 44 y.o. MRN#: [id number] Attending Physician: Florencia Reasons, MD Primary Care Physician: Imelda Pillow, NP Admit Date: 11/09/2015  Reason for Consultation/Follow-up: Disposition, Establishing goals of care and Pain control  Subjective: Unresponsive today-nearing EOL. Daughter Erika [name] at bedside. Interval Events: ICU admission 11/28 PMT consult 11/29 Transferred to 3W 11/30  Length of Stay: 3 days  Current Medications: Scheduled Meds:  . Chlorhexidine Gluconate Cloth  6 each Topical q morning - 10a  . dexamethasone  4 mg Intravenous 4 times per day  . fentaNYL  75 mcg Transdermal Q72H  . fludrocortisone  0.1 mg Oral BID  . mupirocin ointment  1 application Nasal BID  . OLANZapine  2.5 mg Oral QHS  . pregabalin  50 mg Oral BID  . sodium chloride  3 mL Intravenous Q12H    Continuous Infusions: . sodium chloride 1,000 mL (11/13/15 0546)    PRN Meds: HYDROmorphone (DILAUDID) injection, LORazepam, ondansetron **OR** ondansetron (ZOFRAN) IV  Physical Exam: Physical Exam              Vital Signs: BP 92/60 mmHg  Pulse 92  Temp(Src) 98.6 F (37 C) (Axillary)  Resp 8  Ht 5\' 5"  (1.651 m)  Wt 75.297 kg (166 lb)  BMI 27.62 kg/m2  SpO2 100%  LMP 05/30/2013 SpO2: SpO2: 100 % O2 Device: O2 Device: Not Delivered O2 Flow Rate:    Intake/output summary:  Intake/Output Summary (Last 24 hours) at 11/13/15 1005 Last data filed at 11/12/15 1500  Gross per 24 hour  Intake 460.42 ml  Output      0 ml  Net 460.42 ml   LBM: Last BM Date: 10/18/2015 Baseline Weight: Weight: 73.1 kg (161 lb 2.5 oz) Most recent weight: Weight: 75.297 kg (166 lb)       Palliative Assessment/Data: Flowsheet  Rows        Most Recent Value   Intake Tab    Referral Department  Hospitalist   Unit at Time of Referral  ICU   Palliative Care Primary Diagnosis  Cancer   Date Notified  10/16/2015   Palliative Care Type  New Palliative care   Reason for referral  Clarify Goals of Care   Date  of Admission  11/04/2015   # of days IP prior to Palliative referral  0   Clinical Assessment    Psychosocial & Spiritual Assessment    Palliative Care Outcomes       Additional Data Reviewed: CBC    Component Value Date/Time   WBC 13.1* 11/12/2015 0350   WBC 13.7* 10/31/2015 1318   RBC 3.04* 11/12/2015 0350   RBC 3.79 10/31/2015 1318   RBC 2.87* 09/03/2015 0437   HGB 7.6* 11/12/2015 0350   HGB 9.6* 10/31/2015 1318   HCT 25.7* 11/12/2015 0350   HCT 30.1* 10/31/2015 1318   PLT 218 11/12/2015 0350   PLT 352 10/31/2015 1318   MCV 84.5 11/12/2015 0350   MCV 79.5 10/31/2015 1318   MCH 25.0* 11/12/2015 0350   MCH 25.3 10/31/2015 1318   MCHC 29.6* 11/12/2015 0350   MCHC 31.8 10/31/2015 1318   RDW 23.3* 11/12/2015 0350   RDW 23.5* 10/31/2015 1318   LYMPHSABS 1.3 11/02/2015 0119   LYMPHSABS 1.0 10/31/2015 1318   MONOABS 0.8 11/08/2015 0119   MONOABS 0.5 10/31/2015 1318   EOSABS 0.0 10/14/2015 0119   EOSABS 0.0 10/31/2015 1318   BASOSABS 0.0 10/19/2015 0119   BASOSABS 0.1 10/31/2015 1318    CMP     Component Value Date/Time   NA 132* 11/12/2015 0350   NA 130* 10/31/2015 1318   K 4.5 11/12/2015 0350   K 5.0 10/31/2015 1318   CL 107 11/12/2015 0350   CL 102 06/01/2013 1500   CO2 16* 11/12/2015 0350   CO2 27 10/31/2015 1318   GLUCOSE 95 11/12/2015 0350   GLUCOSE 108 10/31/2015 1318   GLUCOSE 95 06/01/2013 1500   BUN 33* 11/12/2015 0350   BUN 14.2 10/31/2015 1318   CREATININE 1.42* 11/12/2015 0350   CREATININE 0.8 10/31/2015 1318   CALCIUM 8.0* 11/12/2015 0350   CALCIUM 9.1 10/31/2015 1318   PROT 5.7* 11/07/2015 0119   PROT 6.8 10/31/2015 1318   ALBUMIN 1.5* 11/02/2015 0119   ALBUMIN 1.5*  10/31/2015 1318   AST 123* 10/29/2015 0119   AST 111* 10/31/2015 1318   ALT 33 11/06/2015 0119   ALT 21 10/31/2015 1318   ALKPHOS 239* 11/04/2015 0119   ALKPHOS 280* 10/31/2015 1318   BILITOT 1.3* 10/30/2015 0119   BILITOT 0.85 10/31/2015 1318   GFRNONAA 44* 11/12/2015 0350   GFRAA 51* 11/12/2015 0350       Problem List:  Patient Active Problem List   Diagnosis Date Noted  . Respiratory distress 10/23/2015  . Acute encephalopathy 10/17/2015  . Acute kidney injury (nontraumatic) ([place])   . Malignant pleural effusion   . Altered mental status   . Brain metastases ([place]) 09/17/2015  . Anemia 09/17/2015  . Hypoalbuminemia 09/17/2015  . Breast cancer metastasized to brain ([place]) 09/09/2015  . Breast cancer metastasized to multiple sites ([place]) 09/09/2015  . AKI (acute kidney injury) ([place])   . Chronic systolic CHF (congestive heart failure) ([place]) 09/02/2015  . Nausea with vomiting 08/17/2015  . Leukocytosis 08/17/2015  . Renal insufficiency 08/17/2015  . 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 Elevated serum creatinine 06/18/2015  . Dehydration 05/27/2015  . Chemotherapy induced neutropenia ([place]) 04/24/2015  . Sinusitis 04/24/2015  . Conjunctivitis 04/24/2015  . 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 Hypotension 02/22/2015  . Sepsis ([place]) 02/22/2015  . UTI (lower urinary tract infection) 02/22/2015  . Hematuria 02/22/2015  . Supratherapeutic INR 02/22/2015  . Pancytopenia due to antineoplastic chemotherapy ([place]) 02/22/2015  . Malnutrition  of moderate degree ([place]) 02/22/2015  . Long term current use of anticoagulant therapy 01/29/2015  . 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 URI (upper respiratory infection) 01/15/2015  . Thrombocytosis ([place]) 12/31/2014  . Anemia in neoplastic disease 12/17/2014  . Hand foot syndrome 11/26/2014  . Neoplasm related pain 10/01/2014  . Neuropathy due to chemotherapeutic drug ([place]) 07/30/2014  . Hypokalemia  07/10/2014  . Breast cancer of upper-inner quadrant of left female breast ([place]) 07/06/2014  . S/P mastectomy 11/15/2013  . DVT (deep venous thrombosis) ([place]) 07/13/2013     Palliative Care Assessment & Plan    1.Code Status:  DNR    Code Status Orders        Start     Ordered   11/02/2015 1234  Do not attempt resuscitation (DNR)   Continuous    Question Answer Comment  Maintain current active treatments Yes   Do not initiate new interventions Yes      10/14/2015 1233       2. Goals of Care/Additional Recommendations:  Daughter tells me that the patient's motehr and sister are coming into town on Saturday- that this is teh earliest they can be here- she thinks that there may be HCPOA paperwork for them to make her health decisions but she is uncertain. Brenton Grills is her long time partner but they are not married- daughter says that family should be involved in decision making for her.  Her daughter is a CNA and tells me she knows her mother is close to dying- she is actually meticulously bathing her body when I walk in the room and "getting her ready".   I expressed my concern that Trevaria may not make it until Saturday. Last night she kept telling her daughter "im getting Leretha Dykes here"   They do not want her moved from the hospital- I introduced the concept of hospice and hospice facility- they are highly mistrustful of "hospice"- I suspect she has only house-days left. I would support a GIP hospice admission due to Trevaria's very fragile state and difficult to control levels of pain- but daughter tells me she doesn't need the support but if it "helps" her mother that she would agree to talk to hospice team.  Limitations on Scope of Treatment: Full Comfort Care  Desire for further Chaplaincy support:yes  Psycho-social Needs: Caregiving  Support/Resources and Grief/Bereavement Support  3. Symptom Management:      1.Currently her pain and dyspnea is well controlled. Maintain  Duragesic, IV fentanyl/dilaudid PRN  4. Palliative Prophylaxis:   Aspiration, Bowel Regimen, Delirium Protocol and Oral Care  5. Prognosis: Hours - Days  6. Discharge Planning:  Anticipated Hospital Death   Care plan was discussed with Patient's daughter at bedside.  Thank you for allowing the Palliative Medicine Team to assist in the care of this patient.   Time In: 825 Time Out: 9 Total Time 35 Prolonged Time Billed no        Acquanetta Chain, DO  11/13/2015, 10:05 AM  Please contact Palliative Medicine Team phone at [phone] for questions and concerns.

## 2015-11-13 NOTE — Progress Notes (Signed)
Visited briefly with family. They are aware of prognosis. Additional family has been alerted to come sooner rather than later (expected tonight). DNR in place.  Appreciate hospitalist's/critical care/ palliative care team's help to this patient and family!

## 2015-11-13 NOTE — Consult Note (Signed)
WOC wound consult note Reason for Consult:Partial thickness tissue loss at right buttock near gluteal cleft Wound type: moisture lesion  Pressure Ulcer POA: No Measurement: 1cm round x 0.2cm Wound [id number], moist Drainage (amount, consistency, odor) serous, small Periwound:intact Dressing procedure/placement/frequency: I will suggest a soft silicone foam dressing to pad, protect and comfort this small area of denudement in the presence of declining status. Moisture accumulating in panus presents a faint, musty odor, I will provide orders and instructions for [facility] for Nursing. Protection nursing team will not follow, but will remain available to this patient, the nursing and medical teams.  Please re-consult if needed. Thanks, Maudie Flakes, MSN, RN, [place], Arther Abbott  Pager# [phone]

## 2015-11-13 DEATH — deceased

## 2015-11-14 ENCOUNTER — Ambulatory Visit: Payer: No Typology Code available for payment source | Admitting: Nurse Practitioner

## 2015-11-14 ENCOUNTER — Ambulatory Visit: Payer: No Typology Code available for payment source

## 2015-11-14 ENCOUNTER — Other Ambulatory Visit: Payer: No Typology Code available for payment source

## 2015-11-14 MED ORDER — ARTIFICIAL TEARS OP OINT
TOPICAL_OINTMENT | Freq: Three times a day (TID) | OPHTHALMIC | Status: DC
  Administered 2015-11-14: 17:00:00 via OPHTHALMIC
  Filled 2015-11-14: qty 3.5

## 2015-11-14 MED ORDER — POLYVINYL ALCOHOL 1.4 % OP SOLN
1.0000 [drp] | OPHTHALMIC | Status: DC | PRN
  Filled 2015-11-14: qty 15

## 2015-11-14 MED ORDER — SODIUM CHLORIDE 0.9 % IV SOLN: 1000.0000 mL | INTRAVENOUS | Status: DC

## 2015-11-14 NOTE — Progress Notes (Signed)
Patient seen, now agonal  Breathing. Appear comfortable, multiple family member in room.

## 2015-11-15 LAB — CULTURE, BLOOD (ROUTINE X 2)
CULTURE: NO GROWTH
Culture: NO GROWTH

## 2015-11-21 ENCOUNTER — Ambulatory Visit: Payer: No Typology Code available for payment source

## 2015-11-21 ENCOUNTER — Other Ambulatory Visit: Payer: No Typology Code available for payment source

## 2015-11-25 ENCOUNTER — Other Ambulatory Visit: Payer: Self-pay | Admitting: Oncology

## 2015-11-26 ENCOUNTER — Encounter: Payer: Self-pay | Admitting: Radiation Therapy

## 2015-11-26 NOTE — Progress Notes (Signed)
Clip from obituary:      Mont Dutton

## 2015-12-14 NOTE — Progress Notes (Signed)
Pt's family was bedside when I arrived; learned pt passed around 9:30. Husband was very tearful and said in shock. Pt's mother, daughter, grandchildren and son-in-law were also bedside. At their request, had prayer for which they were very appreciative. Please page if additional support is needed. Chaplain Marlise Eves Holder   11/20/15 1300  Clinical Encounter Type  Visited With Family

## 2015-12-14 NOTE — Progress Notes (Signed)
This RN called to room at 0930 by family, patient with no respirations or pulse, confirmed with second RN Nancy Marus.

## 2015-12-14 NOTE — Progress Notes (Signed)
Daily Progress Note   Patient Name: Erika [name]       Date: 12-12-15 DOB: Apr 08, 1971  Age: 45 y.o. MRN#: [id number] Attending Physician: Florencia Reasons, MD Primary Care Physician: Imelda Pillow, NP Admit Date: 10/26/2015  Reason for Consultation/Follow-up: Terminal Care  Subjective: Erika [name] is at EOL, unresponsive, mild distress but family feel she is being managed well.  Length of Stay: 5 days  Current Medications: Scheduled Meds:  . artificial tears   Both Eyes 3 times per day  . Chlorhexidine Gluconate Cloth  6 each Topical q morning - 10a  . dexamethasone  4 mg Intravenous 4 times per day  . fentaNYL  75 mcg Transdermal Q72H  . mupirocin ointment  1 application Nasal BID  . sodium chloride  3 mL Intravenous Q12H    Continuous Infusions: . sodium chloride 1,000 mL (11/14/15 1437)    PRN Meds: bisacodyl, HYDROmorphone (DILAUDID) injection, LORazepam, ondansetron **OR** ondansetron (ZOFRAN) IV, polyvinyl alcohol  Physical Exam: Physical Exam  HENT:  Mouth/Throat: No oropharyngeal exudate.  Eyes:  Edema-eye deviation, downward gaze  Pulmonary/Chest: She is in respiratory distress.  Abdominal: She exhibits distension. There is tenderness.  Musculoskeletal: She exhibits edema and tenderness.  Neurological: She is unresponsive.  Skin: She is diaphoretic.                Vital Signs: BP 78/56 mmHg  Pulse 82  Temp(Src) 97.5 F (36.4 C) (Axillary)  Resp 4  Ht 5\' 5"  (1.651 m)  Wt 75.297 kg (166 lb)  BMI 27.62 kg/m2  SpO2 100%  LMP 05/30/2013 SpO2: SpO2: 100 % O2 Device: O2 Device: Nasal Cannula O2 Flow Rate: O2 Flow Rate (L/min): 2 L/min  Intake/output summary: No intake or output data in the 24 hours ending December 12, 2015 0743 LBM: Last BM Date: 11/13/15 Baseline  Weight: Weight: 73.1 kg (161 lb 2.5 oz) Most recent weight: Weight: 75.297 kg (166 lb)       Palliative Assessment/Data: Flowsheet Rows        Most Recent Value   Intake Tab    Referral Department  Hospitalist   Unit at Time of Referral  ICU   Palliative Care Primary Diagnosis  Cancer   Date Notified  10/28/2015   Palliative Care Type  New Palliative care   Reason for referral  Clarify  Goals of Care   Date of Admission  11/07/2015   # of days IP prior to Palliative referral  0   Clinical Assessment    Psychosocial & Spiritual Assessment    Palliative Care Outcomes       Additional Data Reviewed: CBC    Component Value Date/Time   WBC 13.1* 11/12/2015 0350   WBC 13.7* 10/31/2015 1318   RBC 3.04* 11/12/2015 0350   RBC 3.79 10/31/2015 1318   RBC 2.87* 09/03/2015 0437   HGB 7.6* 11/12/2015 0350   HGB 9.6* 10/31/2015 1318   HCT 25.7* 11/12/2015 0350   HCT 30.1* 10/31/2015 1318   PLT 218 11/12/2015 0350   PLT 352 10/31/2015 1318   MCV 84.5 11/12/2015 0350   MCV 79.5 10/31/2015 1318   MCH 25.0* 11/12/2015 0350   MCH 25.3 10/31/2015 1318   MCHC 29.6* 11/12/2015 0350   MCHC 31.8 10/31/2015 1318   RDW 23.3* 11/12/2015 0350   RDW 23.5* 10/31/2015 1318   LYMPHSABS 1.3 10/30/2015 0119   LYMPHSABS 1.0 10/31/2015 1318   MONOABS 0.8 10/19/2015 0119   MONOABS 0.5 10/31/2015 1318   EOSABS 0.0 10/27/2015 0119   EOSABS 0.0 10/31/2015 1318   BASOSABS 0.0 11/11/2015 0119   BASOSABS 0.1 10/31/2015 1318    CMP     Component Value Date/Time   NA 132* 11/12/2015 0350   NA 130* 10/31/2015 1318   K 4.5 11/12/2015 0350   K 5.0 10/31/2015 1318   CL 107 11/12/2015 0350   CL 102 06/01/2013 1500   CO2 16* 11/12/2015 0350   CO2 27 10/31/2015 1318   GLUCOSE 95 11/12/2015 0350   GLUCOSE 108 10/31/2015 1318   GLUCOSE 95 06/01/2013 1500   BUN 33* 11/12/2015 0350   BUN 14.2 10/31/2015 1318   CREATININE 1.42* 11/12/2015 0350   CREATININE 0.8 10/31/2015 1318   CALCIUM 8.0* 11/12/2015 0350    CALCIUM 9.1 10/31/2015 1318   PROT 5.7* 10/30/2015 0119   PROT 6.8 10/31/2015 1318   ALBUMIN 1.5* 11/04/2015 0119   ALBUMIN 1.5* 10/31/2015 1318   AST 123* 11/02/2015 0119   AST 111* 10/31/2015 1318   ALT 33 10/16/2015 0119   ALT 21 10/31/2015 1318   ALKPHOS 239* 10/16/2015 0119   ALKPHOS 280* 10/31/2015 1318   BILITOT 1.3* 11/03/2015 0119   BILITOT 0.85 10/31/2015 1318   GFRNONAA 44* 11/12/2015 0350   GFRAA 51* 11/12/2015 0350    Problem List:  Patient Active Problem List   Diagnosis Date Noted  . Respiratory distress 10/30/2015  . Acute encephalopathy 11/04/2015  . Acute kidney injury (nontraumatic) ([place])   . Malignant pleural effusion   . Altered mental status   . Brain metastases ([place]) 09/17/2015  . Anemia 09/17/2015  . Hypoalbuminemia 09/17/2015  . Breast cancer metastasized to brain ([place]) 09/09/2015  . Breast cancer metastasized to multiple sites ([place]) 09/09/2015  . AKI (acute kidney injury) ([place])   . Chronic systolic CHF (congestive heart failure) ([place]) 09/02/2015  . Nausea with vomiting 08/17/2015  . Leukocytosis 08/17/2015  . Renal insufficiency 08/17/2015  . Chemotherapy-induced cardiomyopathy ([place]) 07/01/2015  . Elevated serum creatinine 06/18/2015  . Dehydration 05/27/2015  . Chemotherapy induced neutropenia ([place]) 04/24/2015  . Sinusitis 04/24/2015  . Conjunctivitis 04/24/2015  . 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 Hypotension 02/22/2015  . Sepsis ([place]) 02/22/2015  . UTI (lower urinary tract infection) 02/22/2015  . Hematuria 02/22/2015  . Supratherapeutic INR 02/22/2015  . Pancytopenia due to antineoplastic chemotherapy ([place]) 02/22/2015  .  Malnutrition of moderate degree ([place]) 02/22/2015  . Long term current use of anticoagulant therapy 01/29/2015  . 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 URI (upper respiratory infection) 01/15/2015  . Thrombocytosis ([place]) 12/31/2014  . Anemia in neoplastic disease 12/17/2014  . Hand foot syndrome  11/26/2014  . Neoplasm related pain 10/01/2014  . Neuropathy due to chemotherapeutic drug ([place]) 07/30/2014  . Hypokalemia 07/10/2014  . Breast cancer of upper-inner quadrant of left female breast ([place]) 07/06/2014  . S/P mastectomy 11/15/2013  . DVT (deep venous thrombosis) ([place]) 07/13/2013     Palliative Care Assessment & Plan    1.Code Status:  DNR    Code Status Orders        Start     Ordered   11/02/2015 1234  Do not attempt resuscitation (DNR)   Continuous    Question Answer Comment  Maintain current active treatments Yes   Do not initiate new interventions Yes      11/07/2015 1233      2. Goals of Care/Additional Recommendations:  Full comfort Care  Limitations on Scope of Treatment: Full Comfort Care  Desire for further Chaplaincy support:yes  Psycho-social Needs: Grief/Bereavement Support  3. Symptom Management:      1.Eye Care, due to edema she has proptosis, need to decrease fluids, added ophthalmic ointment      2. Continue Duragesic and IV prns      3. Palliative PRNs available  4. Palliative Prophylaxis:   Aspiration, Bowel Regimen, Eye Care, Frequent Pain Assessment, Oral Care and Palliative Wound Care  5. Prognosis: hours- days  6. Discharge Planning:  Anticipated Hospital Death   Care plan was discussed with family.  Thank you for allowing the Palliative Medicine Team to assist in the care of this patient.   Time In: 2 Time Out: 225 Total Time 46min Prolonged Time Billed no        Acquanetta Chain, DO  14-Dec-2015, 7:43 AM  Please contact Palliative Medicine Team phone at [phone] for questions and concerns.

## 2015-12-14 NOTE — Discharge Summary (Signed)
Discharge (Death) Summary  Erika [name] [id number] DOB: 1971-11-06  PCP: Imelda Pillow, NP  Admit date: 11/25/15  Time of Death: 9:30am  11-30-2015  Time spent: <38mins   Discharge Diagnoses:  Active Hospital Problems   Diagnosis Date Noted  . Acute encephalopathy 11/25/2015  . Acute kidney injury (nontraumatic) ([place])   . Malignant pleural effusion   . Altered mental status   . Breast cancer metastasized to multiple sites ([place]) 09/09/2015  . AKI (acute kidney injury) ([place])   . Chronic systolic CHF (congestive heart failure) ([place]) 09/02/2015  . Leukocytosis 08/17/2015  . Supratherapeutic INR 02/22/2015  . Hypotension 02/22/2015  . Long term current use of anticoagulant therapy 01/29/2015  . Neoplasm related pain 10/01/2014    Resolved Hospital Problems   Diagnosis Date Noted Date Resolved  No resolved problems to display.      Filed Weights   11-25-2015 0530 11/11/15 0600 11/12/15 1726  Weight: 161 lb 2.5 oz (73.1 kg) 166 lb 0.1 oz (75.3 kg) 166 lb (75.297 kg)    History of present illness: (per admitting MD Dr. Fuller Plan) [name] is a 45 year old female with a past medical history significant for breast cancer stage IV( mets to lung, brain), dilated cardiomyopathy, systolic congestive heart failure with EF 25-30% in 08/2015; who presents with altered mental status. History is obtained from the patient's husband who is present at bedside. Patient was reported to be more lethargic with the swelling in her arms that was worsening since this morning. She had tenderness to palpation of her abdomen. Patient during interview at one point she complains of chest pain substernally. He notes that she was not her normal self and not responding to him appropriately, and therefore became concerned and had her brought to the hospital.  Upon arrival to the emergency department initial blood pressure was 89/67 and respiratory rate was 36. Initial lab work showed WBC 14.1,  hemoglobin 7.8, sodium 131, BUN 34, Cr 1.52. Chest x-ray showed worsening right hemithorax and opacification with probable increase in size of left pulmonary nodules.  Discuss with patient and husband in detail the overall poor prognosis. It appears that discussions had been made to talk with hospice, however family and patient were not able to make contact with them before now. Husband states that we should do everything until family is able to get here to make that decision until then. Family members state that they are coming to the hospital and to try and keep her comfortable.  Patient had received at least 3 L of IV fluids while in the emergency department, but blood pressures still remained relatively low. Then placed while on a rate of 125 mL per hour patient developed chest pain. The fluid rate was decreased to 75 mL per hour, however patient's MAP dropped to 51. Patient was started on pressors of dopamine.   Hospital Course:  Principal Problem:   Acute encephalopathy Active Problems:   Neoplasm related pain   Long term current use of anticoagulant therapy   Hypotension   Supratherapeutic INR   Leukocytosis   Chronic systolic CHF (congestive heart failure) (H[facility])   AKI (acute kidney injury) ([place])   Breast cancer metastasized to multiple sites [facility])   Acute kidney injury (nontraumatic) (H[facility])   Malignant pleural effusion   Altered mental status   45 year old female w/ progressive Metastatic breast cancer In spite of therapy. Presents to ED 11-25-23 w/ acute encephalopathy and hypotension.   SIGNIFICANT EVENTS  2023-11-25: transferred to  PCCM service from IM. Started on MIVFs, dop gtt cont. Pan cultured and abx started. Goals of care discussed. Limitations placed. Family understands she is terminal and that the cancer has progressed.  11/28: remains dopamine dependent. MS a little better. C/o pain. Transitioned to focusing on pain management   Shock. Sepsis vs Hypovolemia vs  relative adrenal insuff. Was treated with dopamine drip. No convincing evidence of infection She was treated with ivf, abx discontinued Stress dose steroids albumin. After discussion with family, aggressive futile treatment was discontinued.  No further treatment of Hypotension (discussed w/ family-->concern here is now QOL)   Acute encephalopathy: favor this is r/t disease progression w/ new mets to brain. CT showed left Cerebellar mass which is new since 9/30 w/ surrounding edema. Also has new left temporal mass. Was treated with Zyprexa    Dyspnea w/ known lung metastasis and prior malignant effusion. Repeat bedside US on 11/28 showed no fluid so suggests that likely has disease progression and now tumor burden resulting in airway obstruction.    AKI w/ lactic acidosis  Received ivf   Progressive stage IV metastatic breast cancer w/ mets to brain, lungs, pleura, abd and bones. -->appreciate oncology Dr Magrinat's input , palliative care input.   Anemia of chronic disease     Cancer related Pain  H/o DVT w/ coumadin induced coagulopathy  Dc coumadin, will be impossible to dose given malnutrition + anemic.  Focus on comfort.   Family understand that 50 of her issues are cancer progression and FTT related & that aggressive therapy may fix numbers but not really help her. Patient is transitioned to comfort management and transfer from critical care service to hospitalist service on 12/1 She remains DNR, no escalation of care and focus on comfort.   Patient is transitioned to full comfort measures, and peacefully passed away with family members at bedside on 12-08-23 at 9:30am. Cause of death: metastatic breast cancer  Consultations:  critical care  oncology Palliative care       Medication List    STOP taking these medications        carvedilol 6.25 MG tablet  Commonly known as:  COREG     cyclobenzaprine 5 MG tablet  Commonly known as:  FLEXERIL      dexamethasone 0.5 MG tablet  Commonly known as:  DECADRON     fentaNYL 25 MCG/HR patch  Commonly known as:  DURAGESIC - dosed mcg/hr     fentaNYL 75 MCG/HR  Commonly known as:  DURAGESIC - dosed mcg/hr     furosemide 20 MG tablet  Commonly known as:  LASIX     gabapentin 300 MG capsule  Commonly known as:  NEURONTIN     loratadine 10 MG tablet  Commonly known as:  CLARITIN     LORazepam 0.5 MG tablet  Commonly known as:  ATIVAN     nystatin 100000 UNIT/ML suspension  Commonly known as:  MYCOSTATIN     ondansetron 8 MG tablet  Commonly known as:  ZOFRAN     oxyCODONE 5 MG immediate release tablet  Commonly known as:  Oxy IR/ROXICODONE     potassium chloride SA 20 MEQ tablet  Commonly known as:  K-DUR,KLOR-CON     predniSONE 10 MG tablet  Commonly known as:  DELTASONE     prochlorperazine 10 MG tablet  Commonly known as:  COMPAZINE     warfarin 2 MG tablet  Commonly known as:  COUMADIN     zolpidem 5 MG  tablet  Commonly known as:  AMBIEN       Allergies  Allergen Reactions  . Doxycycline Nausea And Vomiting      The results of significant diagnostics from this hospitalization (including imaging, microbiology, ancillary and laboratory) are listed below for reference.    Significant Diagnostic Studies: Dg Chest 1 View  10/24/2015  CLINICAL DATA:  Worsened upper extremity edema. Unresponsive. Metastatic left breast cancer. EXAM: CHEST 1 VIEW COMPARISON:  10/21/2015 FINDINGS: There is a right subclavian Port-A-Cath which appears unchanged in position. There is worsened right hemi opacification. There are multiple left lung nodules which appear slightly larger than on 10/21/2015. IMPRESSION: Worsened right hemithorax opacification, and probable increase in size of left pulmonary nodules. Electronically Signed   By: Andreas Newport M.D.   On: 11/07/2015 02:17   Dg Chest 2 View  10/21/2015  CLINICAL DATA:  Sob, weakness, hx breast cancer, possibly for a  thoracentesis if free fluid is present EXAM: CHEST  2 VIEW COMPARISON:  08/17/2015 FINDINGS: Cardiac contours are obscured by significant right-sided pleural effusion. Right-sided PowerPort tip overlies the level of the superior vena cava, unchanged. Right lung opacity is significantly. Small amount of aerated lung persists in the apex. Nodular appearance of pleura at the lung apex, consistent with metastatic disease. Numerous metastases again identified throughout the left lung. Previous left mastectomy and axillary node dissection. Note is made of mottled appearance of right lateral ribs suggestive of metastatic disease. IMPRESSION: 1. Significant increase in right-sided opacity. Considerations include increased pleural effusion or progression of metastatic disease. 2. Further evaluation is CT of the chest is recommended if thoracentesis is being considered. 3. Nodular appearance of the pleura consistent with metastatic disease. Electronically Signed   By: Nolon Nations M.D.   On: 10/21/2015 17:06   Ct Head Wo Contrast  11/02/2015  CLINICAL DATA:  Altered mental status.  Known metastatic disease. EXAM: CT HEAD WITHOUT CONTRAST TECHNIQUE: Contiguous axial images were obtained from the base of the skull through the vertex without intravenous contrast. COMPARISON:  MRI 09/12/2015 FINDINGS: There is a homogeneously hyperdense 8.5 mm mass in the left cerebellar hemisphere with moderate edema. This was not evident on the 09/12/2015 MRI. The hyperdensity of the lesions suggests there could be internal hemorrhage within the mass although there is no evidence of hemorrhage out into the brain parenchyma. There may also be a 12 mm mass in the medial left temporal lobe, but this is not conclusive. There is marginal visibility of the known right occipital metastasis. There is no extra-axial blood or fluid collection. There is mild generalized atrophy. There is no significant mass effect. There is no midline shift. There  is no other significant change from the 09/12/2015 MRI. IMPRESSION: 1. Hyperdense 8.5 mm left cerebellar mass with mild surrounding edema. This could represent a metastatic deposit which has undergone internal hemorrhage. This was not evident on the recent MRI. 2. Equivocal medial left temporal lobe mass which would also be new from the 09/12/2015 MRI. 3. No significant mass effect, midline shift or extra-axial collection. Electronically Signed   By: Andreas Newport M.D.   On: 10/29/2015 02:06   [place] Chest  10/23/2015  CLINICAL DATA:  Abnormal chest x-ray.  Possible pleural fluid. EXAM: CHEST ULTRASOUND COMPARISON:  Two-view chest x-ray 10/21/2015 FINDINGS: No significant pleural fluid is present. IMPRESSION: No significant fluid. Electronically Signed   By: San Morelle M.D.   On: 10/23/2015 13:23    Microbiology: Recent Results (from the past 240 hour(s))  MRSA PCR Screening     Status: Abnormal   Collection Time: 10/23/2015  5:47 AM  Result Value Ref Range Status   MRSA by PCR POSITIVE (A) NEGATIVE Final    Comment:        The GeneXpert MRSA Assay (FDA approved for NASAL specimens only), is one component of a comprehensive MRSA colonization surveillance program. It is not intended to diagnose MRSA infection nor to guide or monitor treatment for MRSA infections. RESULT CALLED TO, READ BACK BY AND VERIFIED WITH: RUSSEL RN AT 0820 ON 11.28.16 BY SHUEA   Culture, blood (routine x 2)     Status: None (Preliminary result)   Collection Time: 10/27/2015 12:55 PM  Result Value Ref Range Status   Specimen Description BLOOD RIGHT ARM  Final   Special Requests IN PEDIATRIC BOTTLE [place]  Final   Culture   Final    NO GROWTH 4 DAYS Performed at [facility]    Report Status PENDING  Incomplete  Culture, blood (routine x 2)     Status: None (Preliminary result)   Collection Time: 10/31/2015  1:05 PM  Result Value Ref Range Status   Specimen Description BLOOD RIGHT HAND  Final    Special Requests IN PEDIATRIC BOTTLE [place]  Final   Culture   Final    NO GROWTH 4 DAYS Performed at [facility]    Report Status PENDING  Incomplete     Labs: Basic Metabolic Panel:  Recent Labs Lab 10/31/2015 0119 11/12/15 0350  NA 131* 132*  K 4.8 4.5  CL 100* 107  CO2 21* 16*  GLUCOSE 112* 95  BUN 34* 33*  CREATININE 1.52* 1.42*  CALCIUM 7.9* 8.0*   Liver Function Tests:  Recent Labs Lab 10/18/2015 0119  AST 123*  ALT 33  ALKPHOS 239*  BILITOT 1.3*  PROT 5.7*  ALBUMIN 1.5*   No results for input(s): LIPASE, AMYLASE in the last 168 hours.  Recent Labs Lab 10/24/2015 0119  AMMONIA 27   CBC:  Recent Labs Lab 10/15/2015 0119 11/12/15 0350  WBC 14.1* 13.1*  NEUTROABS 12.0*  --   HGB 7.8* 7.6*  HCT 25.8* 25.7*  MCV 84.6 84.5  PLT 225 218   Cardiac Enzymes: No results for input(s): CKTOTAL, CKMB, CKMBINDEX, TROPONINI in the last 168 hours. BNP: BNP (last 3 results)  Recent Labs  09/02/15 1634  BNP 21.7    ProBNP (last 3 results) No results for input(s): PROBNP in the last 8760 hours.  CBG:  Recent Labs Lab 10/15/2015 0117  GLUCAP 105*       SignedFlorencia Reasons MD, PhD  [facility] 12/01/2015, 1:21 PM

## 2015-12-14 DEATH — deceased

## 2016-05-12 IMAGING — US US RENAL
1 series · 14 of 25 positions shown · non-contrast
Comparison: Multiple exams, including 07/10/2015

CLINICAL DATA: Hypertension. Acute kidney injury. Metastatic breast
cancer

EXAM:
RENAL / URINARY TRACT ULTRASOUND COMPLETE

[Series 1: us renal · 0.26mm/px · 14 of 25 slices shown]
[im 1/25]
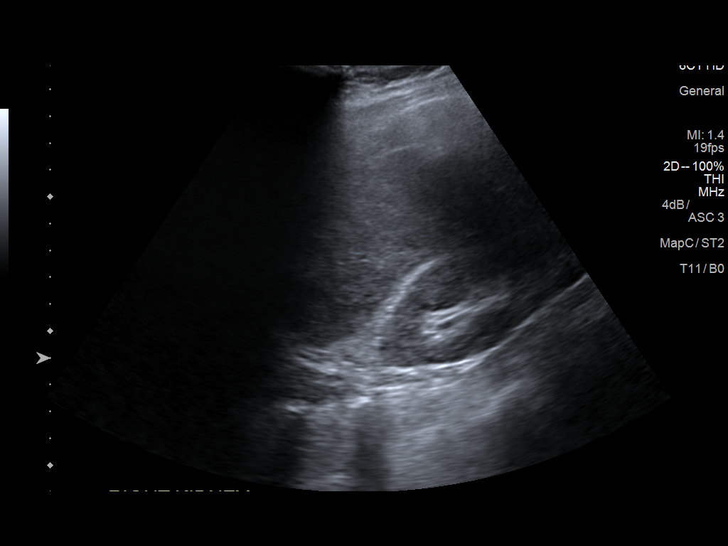
[im 3/25]
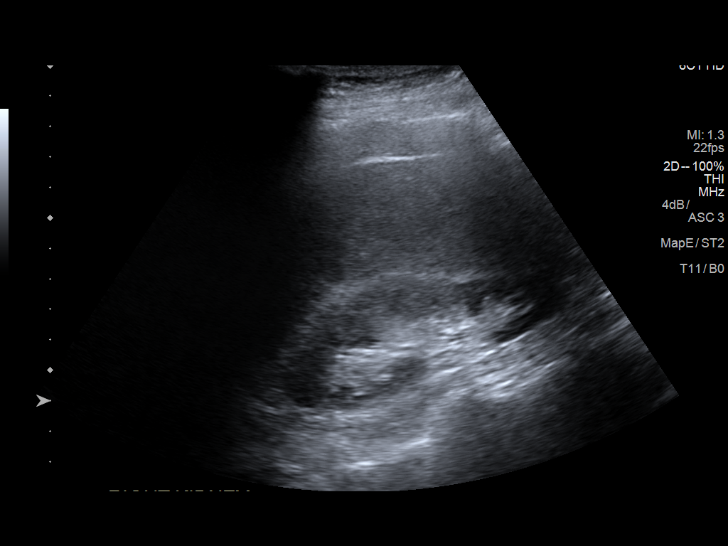
[im 5/25]
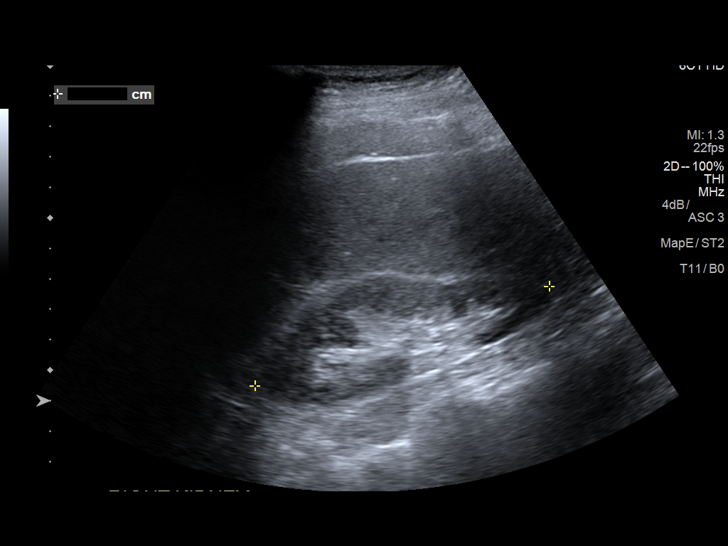
[im 7/25]
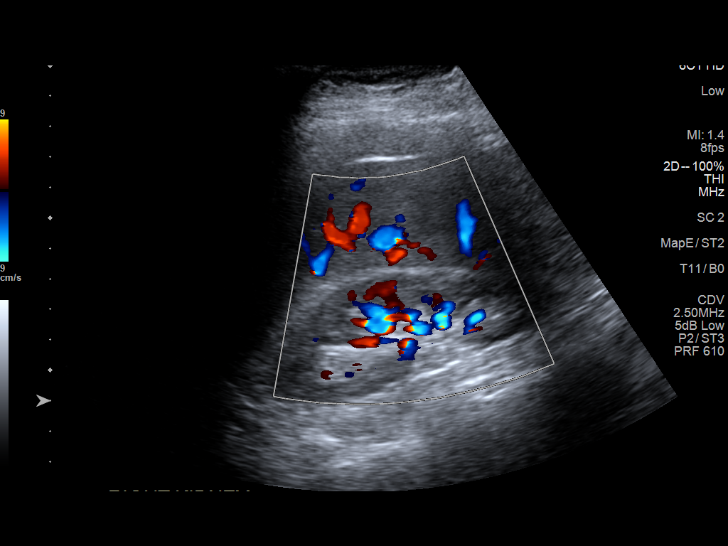
[im 9/25]
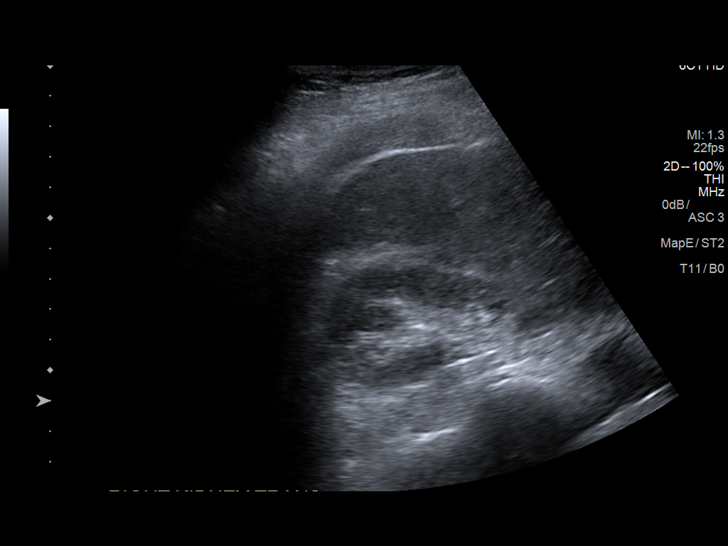
[im 10/25]
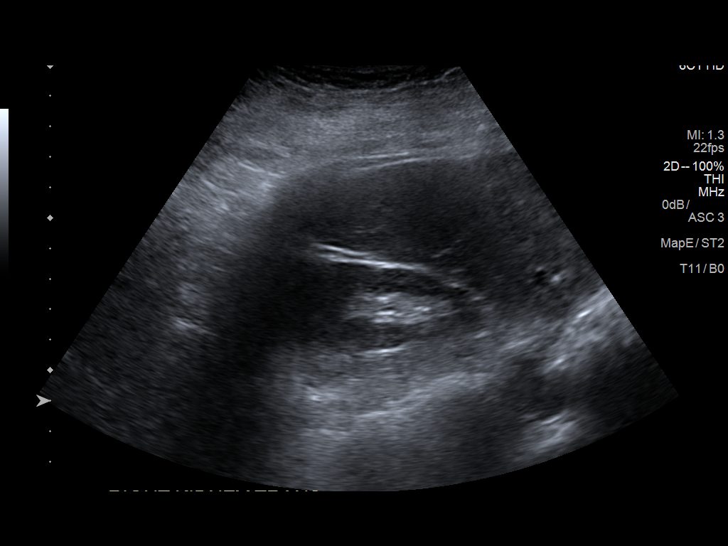
[im 12/25]
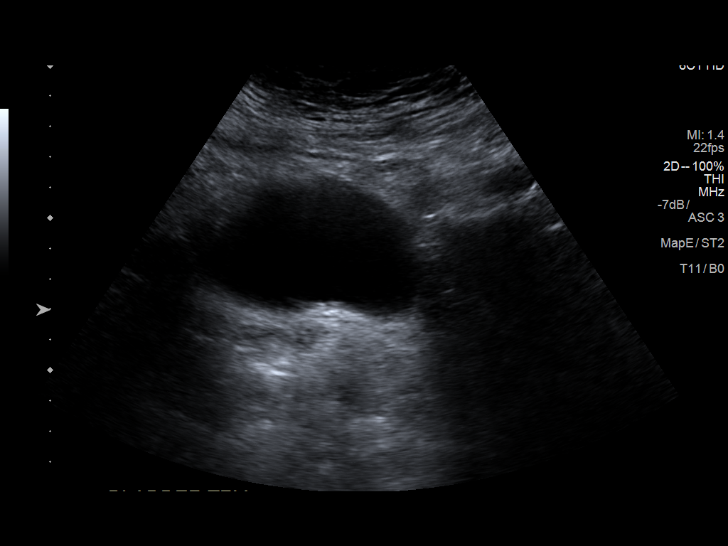
[im 14/25]
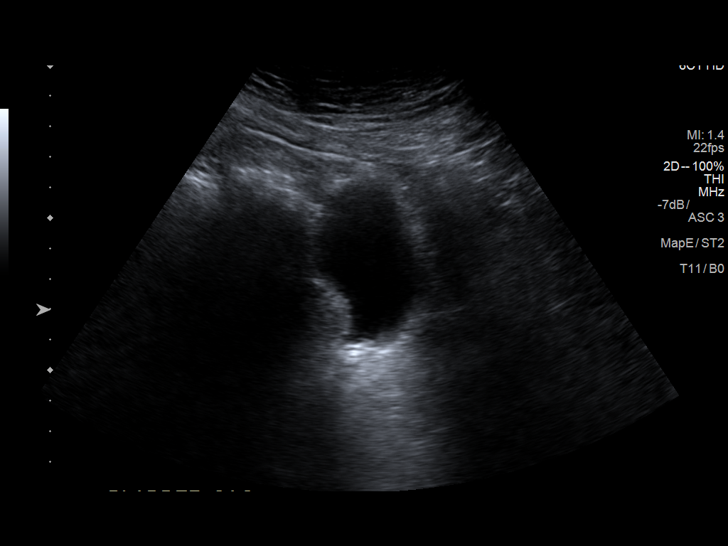
[im 16/25]
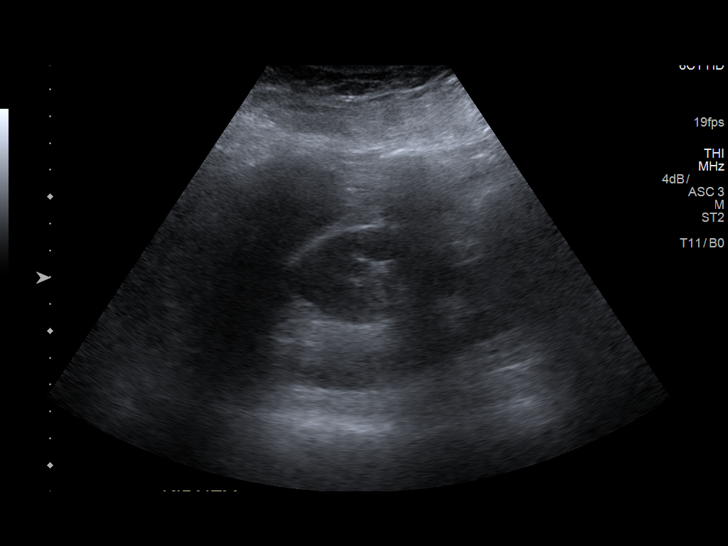
[im 17/25]
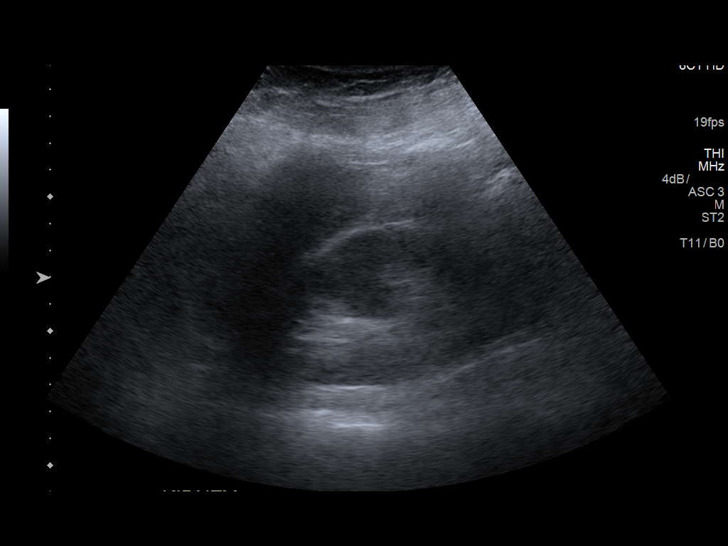
[im 19/25]
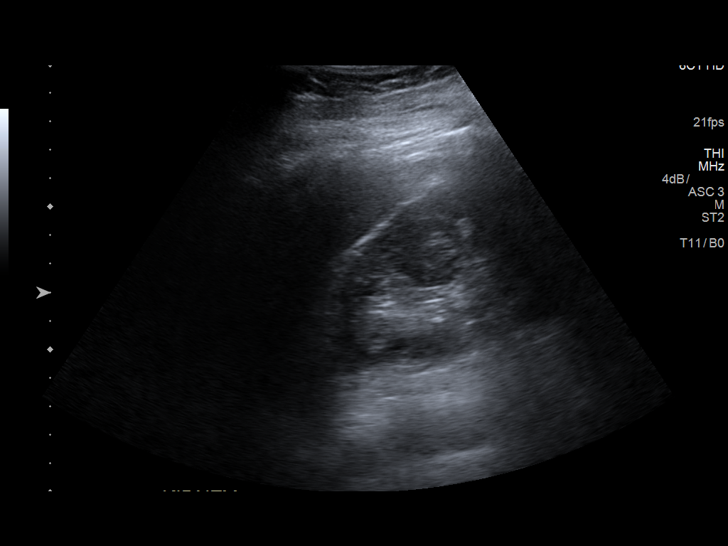
[im 21/25]
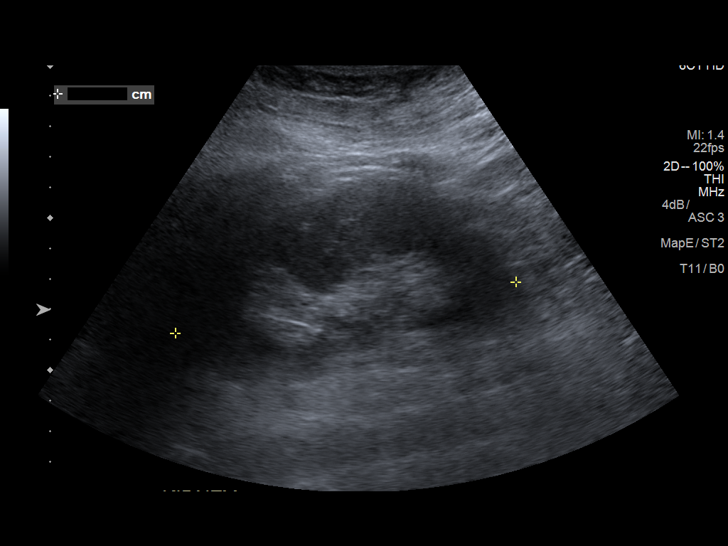
[im 23/25]
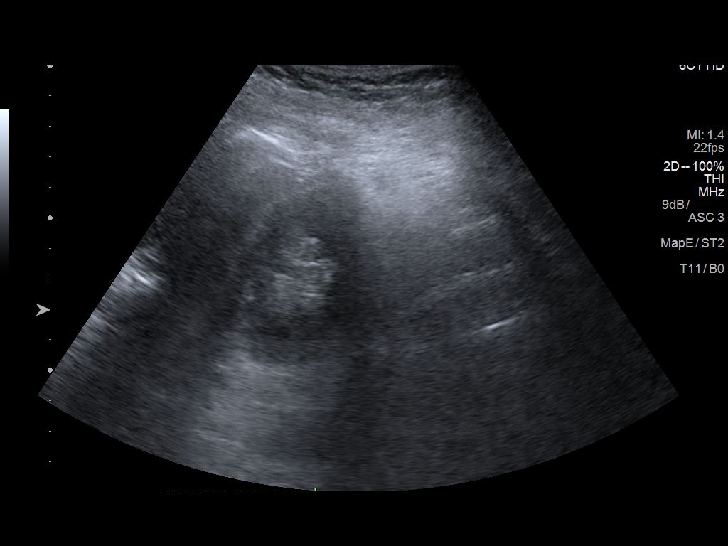
[im 25/25]
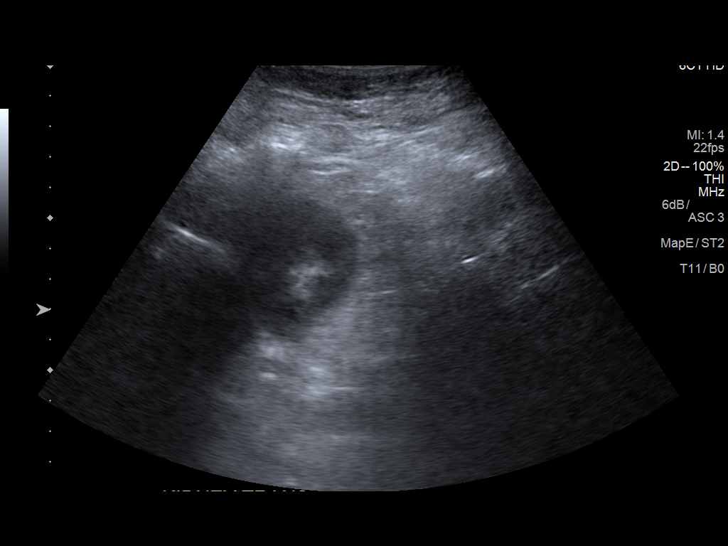

[14 of 25 positions shown; findings below may reference images not displayed]

FINDINGS: Right Kidney:

Length: 10.3 cm. Echogenicity within normal limits. No mass or
hydronephrosis visualized.

Left Kidney:

Length: 11.3 cm. Echogenicity within normal limits. No mass or
hydronephrosis visualized.

Bladder:

Appears normal for degree of bladder distention.
IMPRESSION: 1. Normal sonographic appearance of both kidneys. No abnormal
echogenicity or hydronephrosis.

## 2016-05-29 ENCOUNTER — Other Ambulatory Visit: Payer: Self-pay | Admitting: Nurse Practitioner

## 2016-05-31 ENCOUNTER — Other Ambulatory Visit: Payer: Self-pay | Admitting: Nurse Practitioner

## 2016-07-03 IMAGING — MR MR HEAD WO/W CM
9 of 13 series · 34 of 48 positions shown · IV contrast (Yes)
Comparison: 07/11/2014

CLINICAL DATA: Personal history of left breast cancer. Assess for
metastatic disease. Confusion and visual changes.

EXAM:
MRI HEAD WITHOUT AND WITH CONTRAST
TECHNIQUE: Multiplanar, multiecho pulse sequences of the brain and surrounding
structures were obtained without and with intravenous contrast.
CONTRAST:  16 cc MultiHance

[Series 3: T2 post-contrast · coronal · 5.0mm · 0.45mm/px · 3 of 34 slices shown]
[im 1/34]
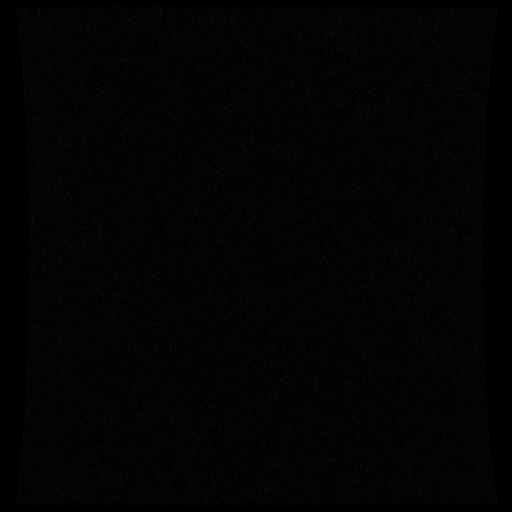
[im 17/34]
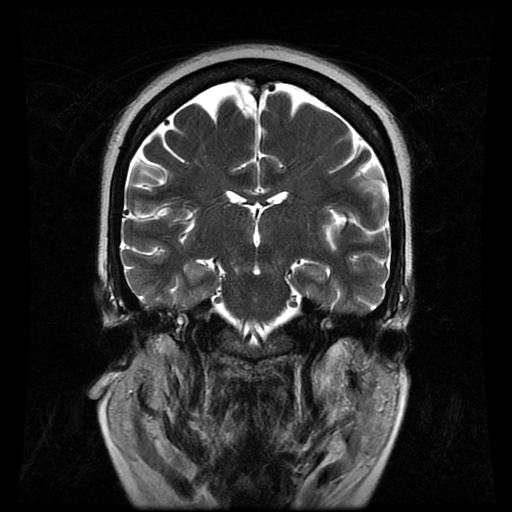
[im 34/34]
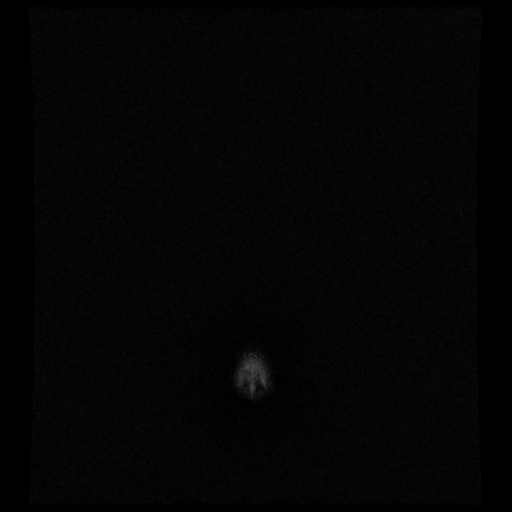

[Series 4: T1 post-contrast · coronal · 5.0mm · 0.45mm/px · 3 of 34 slices shown (1 of 2)]
[im 1/34]
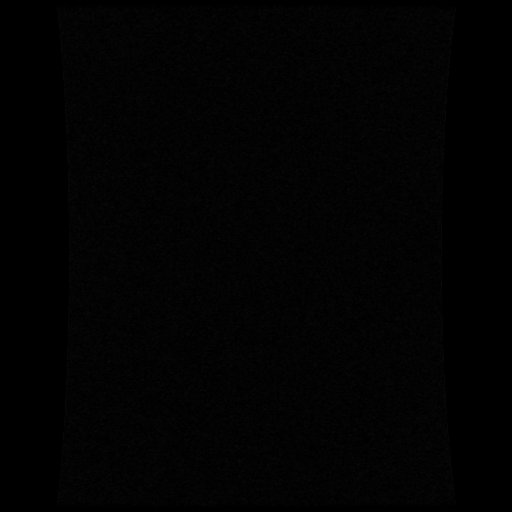
[im 17/34]
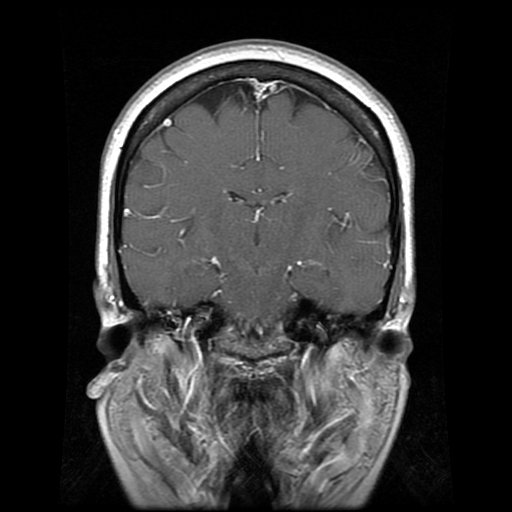
[im 34/34]
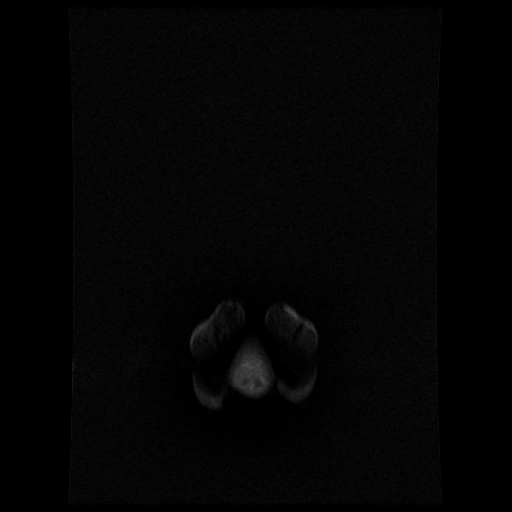

[Series 4: DWI · axial · 3.0mm · 1.09mm/px · z∈[-56,+92]mm · 9 of 102 slices shown (1 of 4)]
[im 1/102]
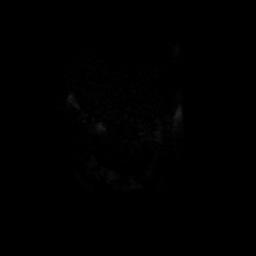
[im 13/102]
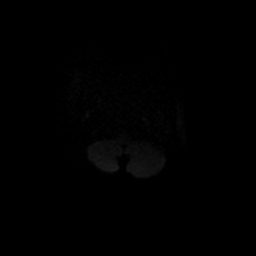
[im 26/102]
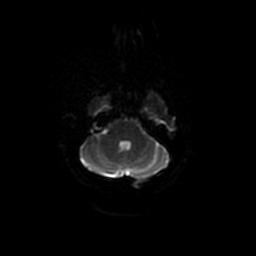
[im 38/102]
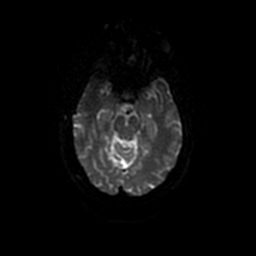
[im 51/102]
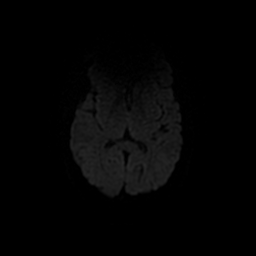
[im 64/102]
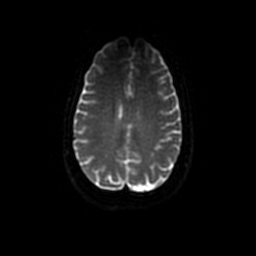
[im 76/102]
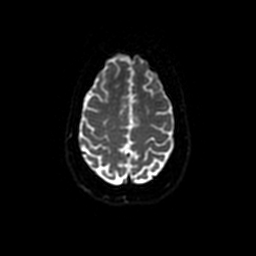
[im 89/102]
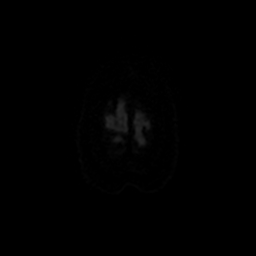
[im 102/102]
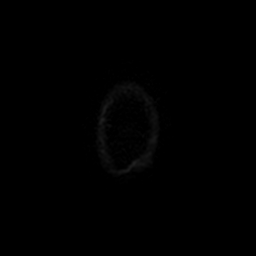

[Series 5: T1 post-contrast · sagittal · 5.0mm · 0.47mm/px · 2 of 23 slices shown (2 of 2)]
[im 1/23]
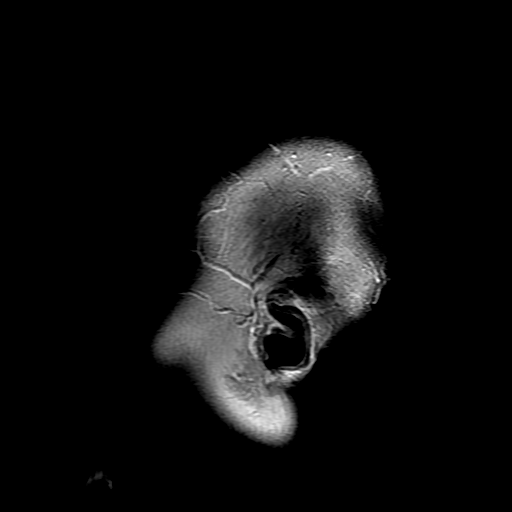
[im 23/23]
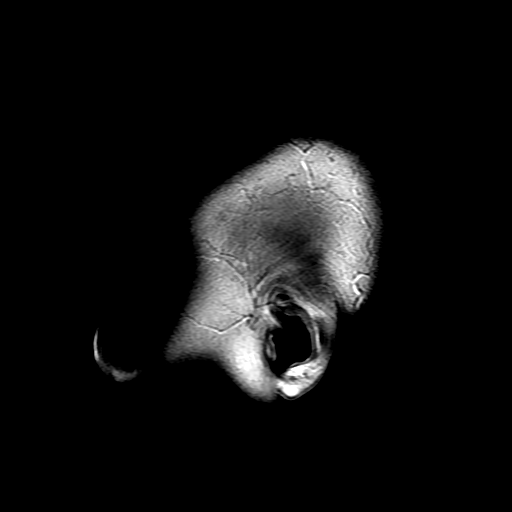

[Series 5: DWI · coronal · 5.0mm · 1.09mm/px · 6 of 76 slices shown (2 of 4)]
[im 1/76]
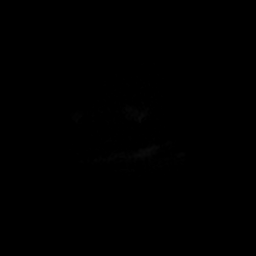
[im 16/76]
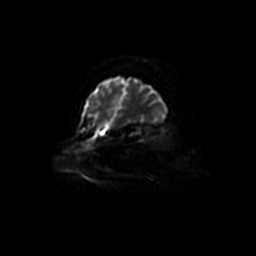
[im 31/76]
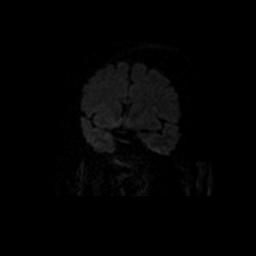
[im 46/76]
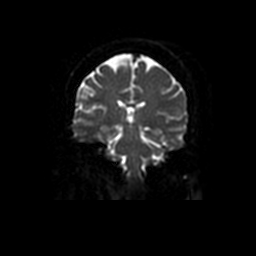
[im 61/76]
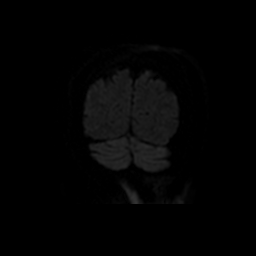
[im 76/76]
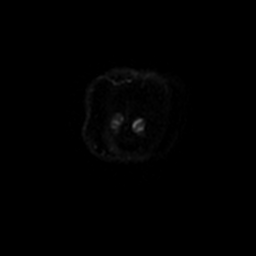

[Series 6: T2 · axial · 5.0mm · 0.43mm/px · z∈[-62,+84]mm · 2 of 24 slices shown]
[im 1/24]
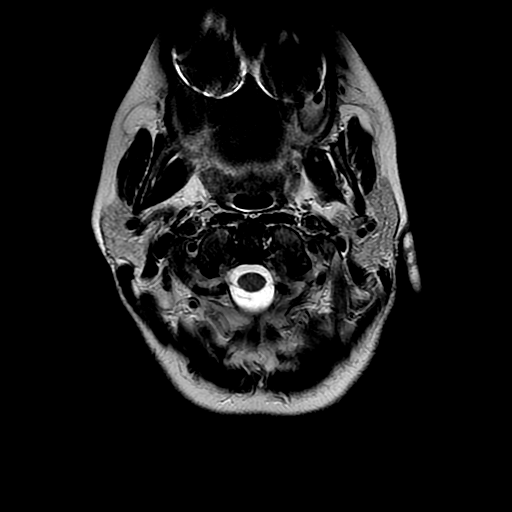
[im 24/24]
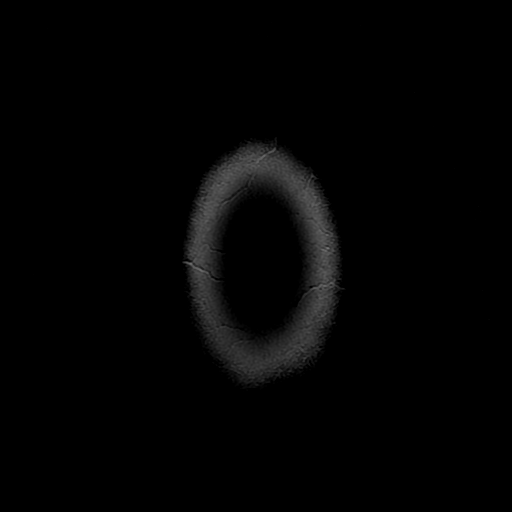

[Series 7: FLAIR · axial · 5.0mm · 0.43mm/px · z∈[-67,+90]mm · 2 of 24 slices shown]
[im 1/24]
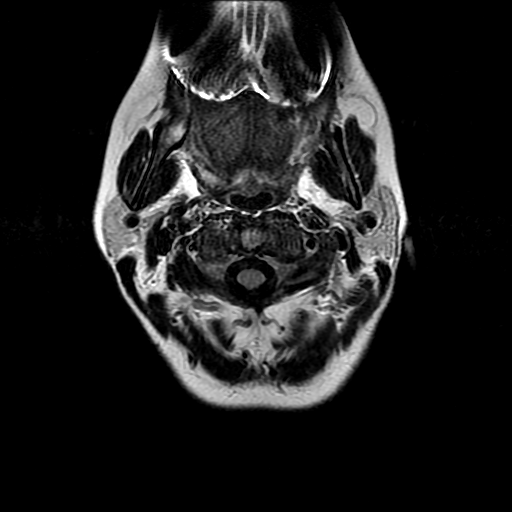
[im 24/24]
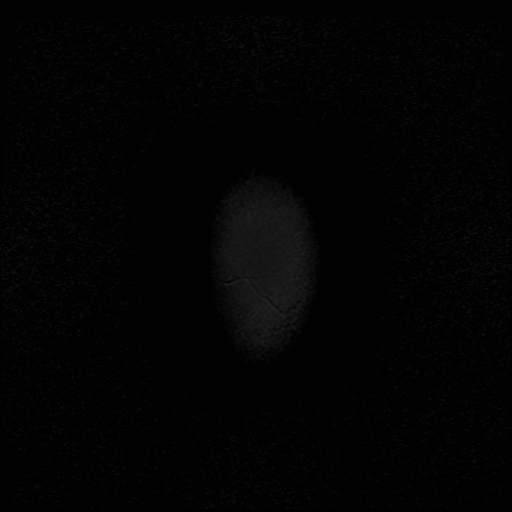

[Series 400: DWI · axial · 3.0mm · 1.09mm/px · z∈[-56,+92]mm · 4 of 51 slices shown (3 of 4)]
[im 1/51]
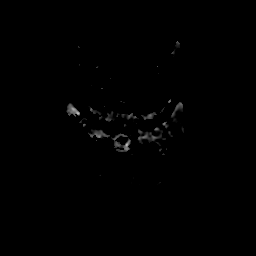
[im 17/51]
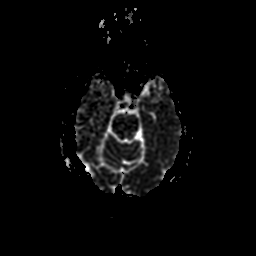
[im 34/51]
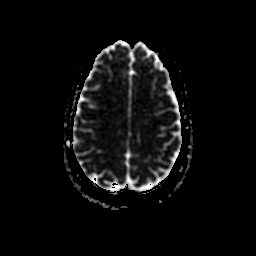
[im 51/51]
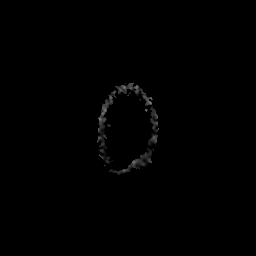

[Series 500: DWI · coronal · 5.0mm · 1.09mm/px · 3 of 38 slices shown (4 of 4)]
[im 1/38]
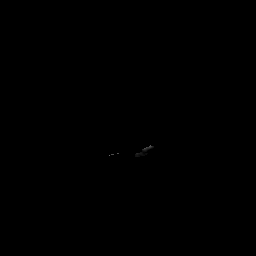
[im 19/38]
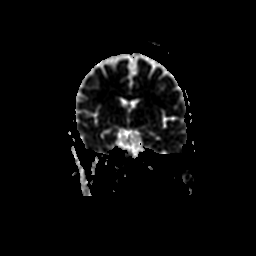
[im 38/38]
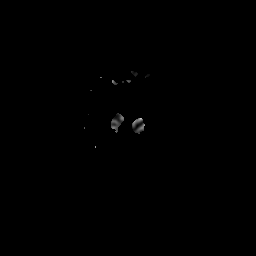

[34 of 48 positions shown; findings below may reference images not displayed]

FINDINGS: The brain has a normal appearance on all pulse sequences without
evidence of malformation, atrophy, old or acute infarction, mass
lesion, hemorrhage, hydrocephalus or extra-axial collection. No
pituitary mass. No fluid in the sinuses, middle ears or mastoids. No
skull or skullbase lesion. There is flow in the major vessels at the
base of the brain. Major venous sinuses show flow. After contrast
administration, no abnormal enhancement occurs. No change since the
previous study.
IMPRESSION: Normal study. No evidence of metastatic disease. No cause of the
presenting symptoms is identified.

## 2016-08-22 ENCOUNTER — Other Ambulatory Visit: Payer: Self-pay | Admitting: Oncology

## 2016-09-25 IMAGING — CR DG ABDOMEN 1V
2 series · 2 of 2 positions shown · non-contrast
Comparison: 05/01/2015 CT

CLINICAL DATA: Abdominal pain and vomiting today.

EXAM:
ABDOMEN - 1 VIEW

[t abdomen supine (1 of 2)]
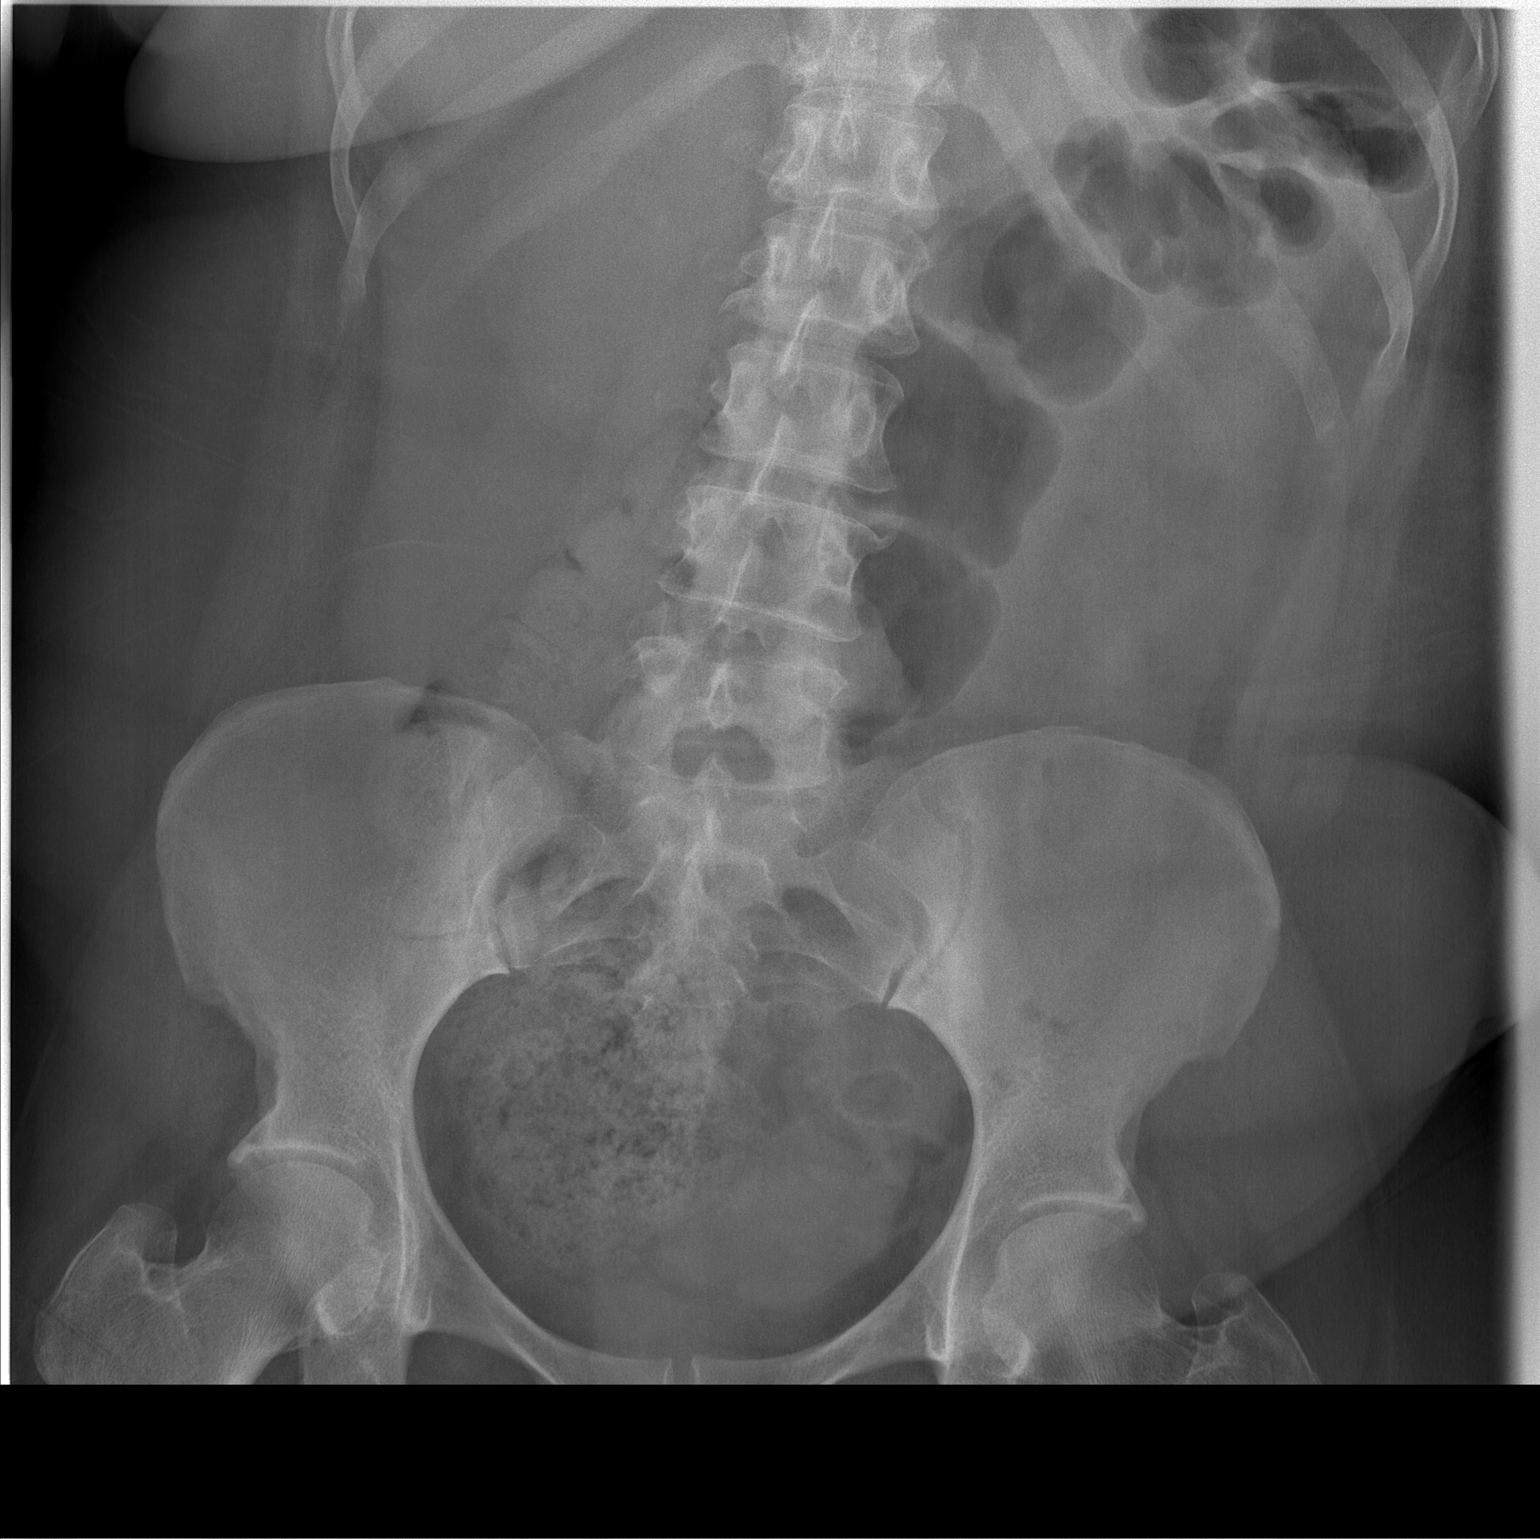

[t abdomen supine (2 of 2)]
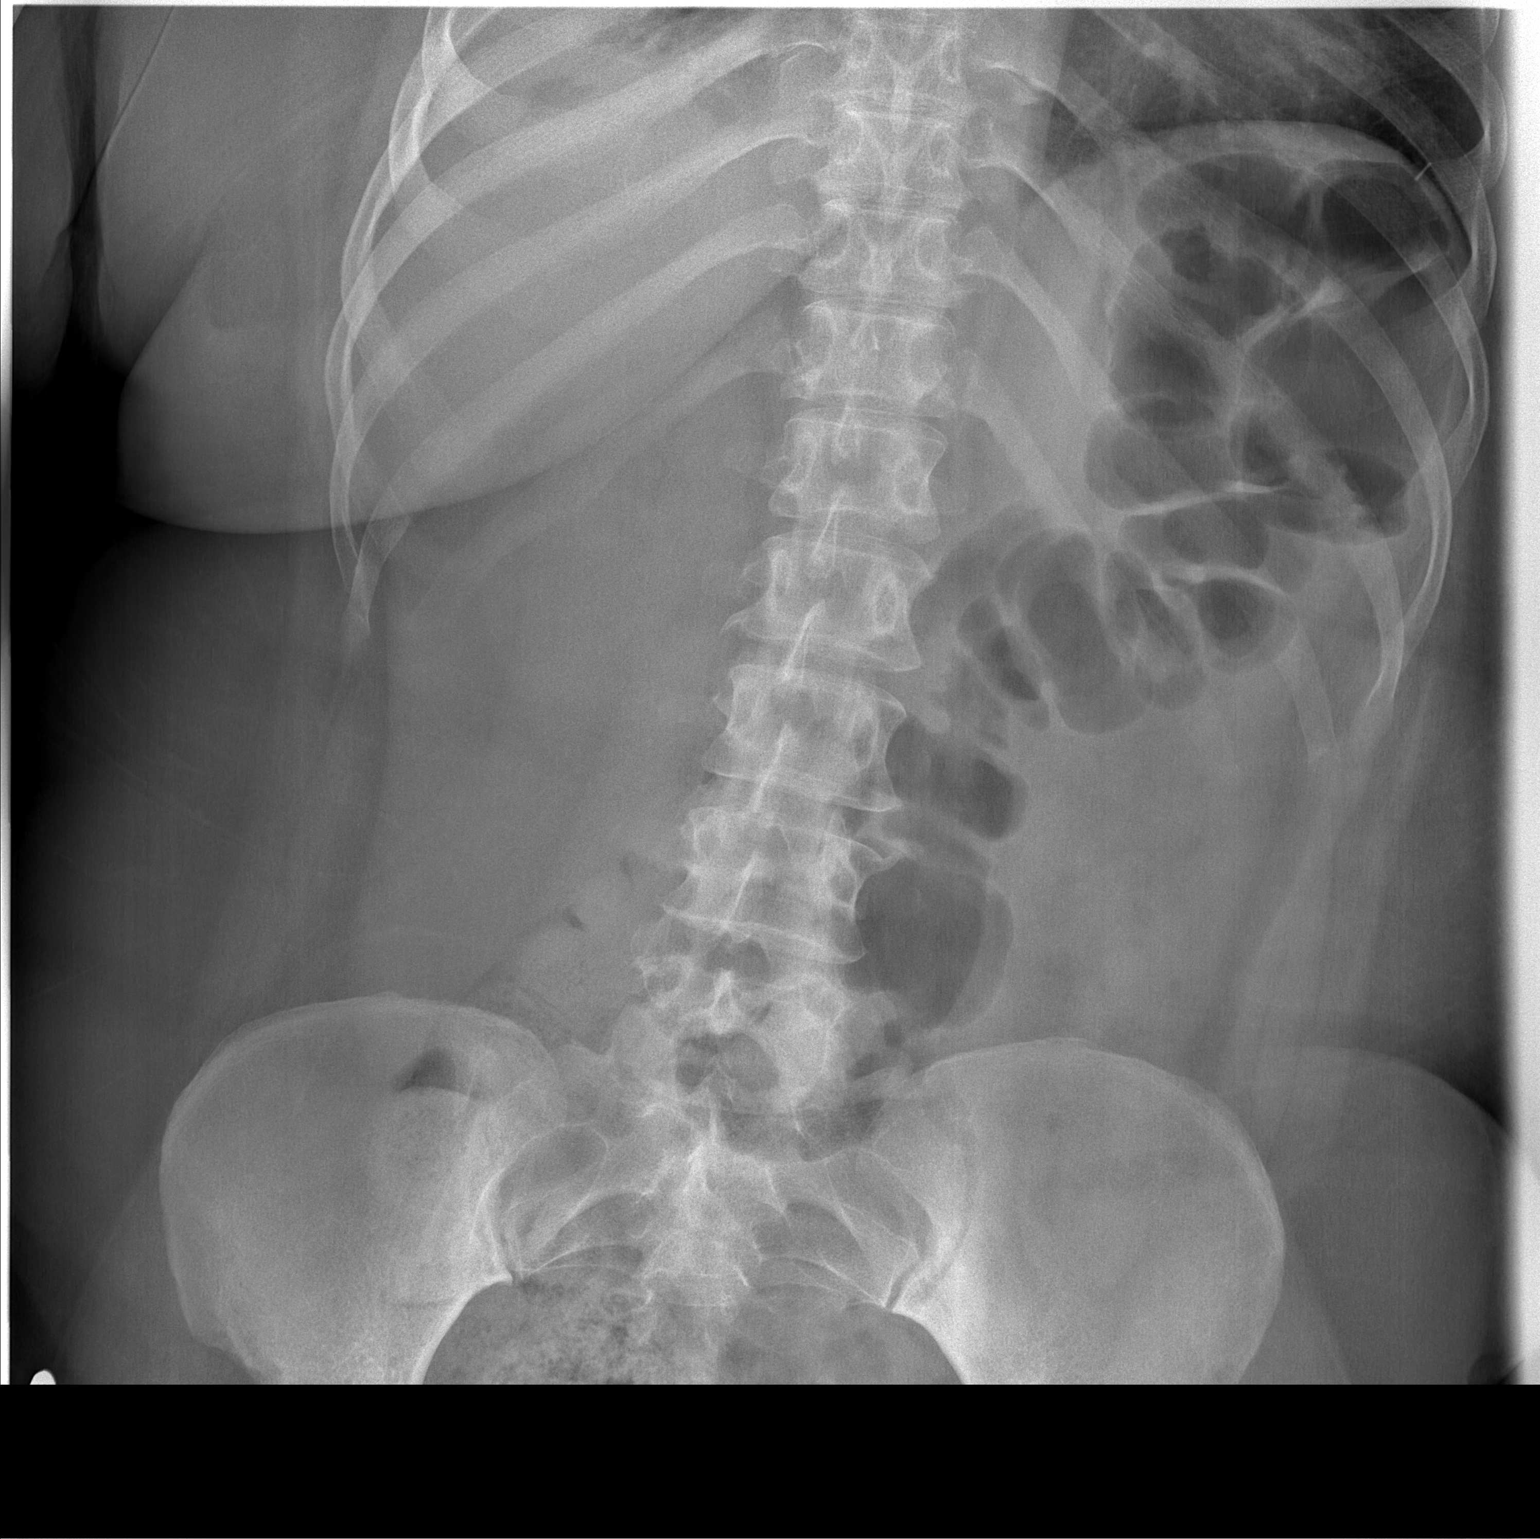

[2 of 2 positions shown; findings below may reference images not displayed]

FINDINGS: Moderate stool in the proximal colon is again noted.

There is no evidence of bowel obstruction, there is no evidence of
bowel obstruction or pneumoperitoneum.

No suspicious calcifications are identified.

No acute bony abnormalities are noted.
IMPRESSION: No evidence of acute abnormality.

Moderate stool in the proximal colon again noted.
